# Patient Record
Sex: Female | Born: 1985 | Race: Black or African American | Hispanic: No | Marital: Single | Smoking: Never smoker
Health system: Southern US, Community
[De-identification: ages and names within clinical notes are randomized; demographics above are authoritative.]

## PROBLEM LIST (undated history)

## (undated) ENCOUNTER — Inpatient Hospital Stay (HOSPITAL_COMMUNITY): Payer: Self-pay

## (undated) ENCOUNTER — Ambulatory Visit (HOSPITAL_COMMUNITY): Admission: EM | Discharge status: Absent without leave | Payer: Medicaid Other

## (undated) DIAGNOSIS — T7840XA Allergy, unspecified, initial encounter: Secondary | ICD-10-CM

## (undated) DIAGNOSIS — G4730 Sleep apnea, unspecified: Secondary | ICD-10-CM

## (undated) DIAGNOSIS — K219 Gastro-esophageal reflux disease without esophagitis: Secondary | ICD-10-CM

## (undated) DIAGNOSIS — J45909 Unspecified asthma, uncomplicated: Secondary | ICD-10-CM

## (undated) DIAGNOSIS — T783XXA Angioneurotic edema, initial encounter: Secondary | ICD-10-CM

## (undated) HISTORY — DX: Gastro-esophageal reflux disease without esophagitis: K21.9

## (undated) HISTORY — DX: Angioneurotic edema, initial encounter: T78.3XXA

## (undated) HISTORY — DX: Allergy, unspecified, initial encounter: T78.40XA

## (undated) HISTORY — DX: Unspecified asthma, uncomplicated: J45.909

## (undated) HISTORY — DX: Sleep apnea, unspecified: G47.30

---
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
## 2003-06-27 ENCOUNTER — Other Ambulatory Visit: Admission: RE | Admit: 2003-06-27 | Discharge: 2003-06-27 | Payer: Self-pay | Admitting: Obstetrics and Gynecology

## 2004-12-21 ENCOUNTER — Emergency Department (HOSPITAL_COMMUNITY): Admission: EM | Admit: 2004-12-21 | Discharge: 2004-12-22 | Payer: Self-pay | Admitting: Emergency Medicine

## 2005-04-26 ENCOUNTER — Other Ambulatory Visit: Admission: RE | Admit: 2005-04-26 | Discharge: 2005-04-26 | Payer: Self-pay | Admitting: Gynecology

## 2006-01-03 ENCOUNTER — Other Ambulatory Visit: Admission: RE | Admit: 2006-01-03 | Discharge: 2006-01-03 | Payer: Self-pay | Admitting: Gynecology

## 2008-05-03 ENCOUNTER — Ambulatory Visit: Payer: Self-pay | Admitting: Vascular Surgery

## 2008-05-03 ENCOUNTER — Encounter (INDEPENDENT_AMBULATORY_CARE_PROVIDER_SITE_OTHER): Payer: Self-pay | Admitting: Emergency Medicine

## 2008-05-03 ENCOUNTER — Emergency Department (HOSPITAL_COMMUNITY): Admission: EM | Admit: 2008-05-03 | Discharge: 2008-05-03 | Payer: Self-pay | Admitting: Emergency Medicine

## 2008-07-31 ENCOUNTER — Other Ambulatory Visit: Admission: RE | Admit: 2008-07-31 | Discharge: 2008-07-31 | Payer: Self-pay | Admitting: Family Medicine

## 2008-09-13 ENCOUNTER — Emergency Department (HOSPITAL_BASED_OUTPATIENT_CLINIC_OR_DEPARTMENT_OTHER): Admission: EM | Admit: 2008-09-13 | Discharge: 2008-09-13 | Payer: Self-pay | Admitting: Emergency Medicine

## 2008-09-13 ENCOUNTER — Ambulatory Visit: Payer: Self-pay | Admitting: Diagnostic Radiology

## 2010-04-22 LAB — GLUCOSE, CAPILLARY: Glucose-Capillary: 196 mg/dL — ABNORMAL HIGH (ref 70–99)

## 2011-03-30 ENCOUNTER — Encounter (HOSPITAL_COMMUNITY): Payer: Self-pay

## 2011-03-30 ENCOUNTER — Emergency Department (INDEPENDENT_AMBULATORY_CARE_PROVIDER_SITE_OTHER)
Admission: EM | Admit: 2011-03-30 | Discharge: 2011-03-30 | Disposition: A | Discharge status: Home / Self Care | Payer: Self-pay | Source: Home / Self Care | Attending: Emergency Medicine | Admitting: Emergency Medicine

## 2011-03-30 DIAGNOSIS — R109 Unspecified abdominal pain: Secondary | ICD-10-CM

## 2011-03-30 DIAGNOSIS — R7309 Other abnormal glucose: Secondary | ICD-10-CM

## 2011-03-30 DIAGNOSIS — R739 Hyperglycemia, unspecified: Secondary | ICD-10-CM

## 2011-03-30 LAB — POCT PREGNANCY, URINE: Preg Test, Ur: NEGATIVE

## 2011-03-30 LAB — POCT URINALYSIS DIP (DEVICE)
Glucose, UA: 500 mg/dL — AB
Ketones, ur: NEGATIVE mg/dL
Protein, ur: NEGATIVE mg/dL
Specific Gravity, Urine: 1.02 (ref 1.005–1.030)

## 2011-03-30 LAB — GLUCOSE, CAPILLARY: Glucose-Capillary: 352 mg/dL — ABNORMAL HIGH (ref 70–99)

## 2011-03-30 MED ORDER — METFORMIN HCL 500 MG PO TABS: 1000.0000 mg | ORAL_TABLET | Freq: Two times a day (BID) | ORAL | Status: DC

## 2011-03-30 NOTE — ED Notes (Signed)
Spoke to the physician in regards to patient and her request for snack.  Hold on po for now

## 2011-03-30 NOTE — ED Notes (Signed)
Given diet ginger ale and nutrigrain bar

## 2011-03-30 NOTE — ED Provider Notes (Signed)
History     CSN: [id number]  Arrival date & time 03/30/11  0907   First MD Initiated Contact with Patient 03/30/11 1011      Chief Complaint  Patient presents with  . Abdominal Pain    (Consider location/radiation/quality/duration/timing/severity/associated sxs/prior treatment) HPI Comments: Patient presents urgent care today complaining of ongoing abdominal pain for about 2 months occasionally feels like burning (points to epigastric area) occasionally feels the pain is on her right side and sometimes move towards her left side her abdomen. No vomiting, no nausea, no fevers, no urinary symptoms. She does describe however that she's feels somewhat always thirsty and urinates frequently but that has not changed for months.  Patient describes that she remembers her last time her hemoglobin A1c was checked was off the charts she has been unable to followup with her doctor for a "while".  Patient is a 26 y.o. female presenting with abdominal pain. The history is provided by the patient.  Abdominal Pain The primary symptoms of the illness include abdominal pain. The primary symptoms of the illness do not include fever, fatigue, shortness of breath, vomiting, diarrhea, dysuria, vaginal discharge or vaginal bleeding. The current episode started more than 2 days ago. The onset of the illness was sudden. The problem has not changed since onset. The patient states that she believes she is currently not pregnant. Additional symptoms associated with the illness include frequency. Symptoms associated with the illness do not include chills, anorexia, diaphoresis, constipation, hematuria or back pain.    Past Medical History  Diagnosis Date  . Diabetes mellitus     History reviewed. No pertinent past surgical history.  History reviewed. No pertinent family history.  History  Substance Use Topics  . Smoking status: Never Smoker   . Smokeless tobacco: Not on file  . Alcohol Use: No    OB  History    Grav Para Term Preterm Abortions TAB SAB Ect Mult Living                  Review of Systems  Constitutional: Negative for fever, chills, diaphoresis, activity change and fatigue.  Respiratory: Negative for shortness of breath.   Gastrointestinal: Positive for abdominal pain. Negative for vomiting, diarrhea, constipation, abdominal distention, rectal pain and anorexia.  Genitourinary: Positive for frequency. Negative for dysuria, hematuria, vaginal bleeding and vaginal discharge.  Musculoskeletal: Negative for back pain.    Allergies  Review of patient's allergies indicates no known allergies.  Home Medications   Current Outpatient Rx  Name Route Sig Dispense Refill  . ASPIRIN BUFFERED PO Oral Take by mouth.    [name] PEPTO-BISMOL PO Oral Take by mouth.    [name] GLIPIZIDE 10 MG PO TABS Oral Take 10 mg by mouth 2 (two) times daily before a meal.    . IBUPROFEN 200 MG PO TABS Oral Take 400 mg by mouth every 6 (six) hours as needed.    [name] METFORMIN HCL 500 MG PO TABS Oral Take 2 tablets (1,000 mg total) by mouth 2 (two) times daily with a meal. 60 tablet 0    BP 121/84  Pulse 91  Temp(Src) 98.5 F (36.9 C) (Oral)  Resp 16  SpO2 99%  LMP 03/24/2011  Physical Exam  Constitutional:  Non-toxic appearance. She does not have a sickly appearance. She does not appear ill. No distress.    Abdominal: Soft. Normal appearance. She exhibits no pulsatile liver, no abdominal bruit and no mass. There is no hepatosplenomegaly or hepatomegaly. There is tenderness  in the epigastric area and periumbilical area. There is no rigidity, no rebound, no guarding, no CVA tenderness, no tenderness at McBurney's point and negative Murphy's sign. No hernia. Hernia confirmed negative in the ventral area.    Skin: She is not diaphoretic.    ED Course  Procedures (including critical care time)  Labs Reviewed  POCT URINALYSIS DIP (DEVICE) - Abnormal; Notable for the following:    Glucose, UA 500 (*)      Hgb urine dipstick TRACE (*)    All other components within normal limits  GLUCOSE, CAPILLARY - Abnormal; Notable for the following:    Glucose-Capillary 352 (*)    All other components within normal limits  POCT PREGNANCY, URINE   No results found.   1. Hyperglycemia   2. Abdominal pain       MDM  Have recommended patient to followup with her primary care doctor and if any acute exacerbation of her gastrointestinal symptoms of the emergency department to rule out a gallbladder disorder such as stones or infection this (especially if fevers). Patient continues to be hypoglycemic for at least 6-8 months to a year and we will schedule a followup with her primary care Dr. this week she agreed to increase her metformin dose and be more observant of her diet.        Karina Molly, MD 03/30/11 1200

## 2011-03-30 NOTE — ED Notes (Signed)
Patient c/o of right sided abdominal pain; pt states that the pain started 2 months with burning and pulsating. Majority of pain is on right side with incident of pain on the left side once. Patient denies chest pain or back pain. Neck pain on right side with pulsating symptoms.

## 2011-03-30 NOTE — Discharge Instructions (Signed)
We discussed the importance of you following up within the next one to 2 weeks with your primary care Dr. for further diabetes management. Your exam and labs were consistent with moderate to marked hyperglycemia. Be cautious about your diet and increase your metformin dose as explained into you get to see your Dr.  Your abdominal pain is possibly related to your diabetes, if symptoms were to change including worsening of your abdominal pain, fevers, vomiting or no improvement should followup in the emergency department for further evaluation and rule out other conditions.    Abdominal Pain Abdominal pain can be caused by many things. Your caregiver decides the seriousness of your pain by an examination and possibly blood tests and X-rays. Many cases can be observed and treated at home. Most abdominal pain is not caused by a disease and will probably improve without treatment. However, in many cases, more time must pass before a clear cause of the pain can be found. Before that point, it may not be known if you need more testing, or if hospitalization or surgery is needed. HOME CARE INSTRUCTIONS   Do not take laxatives unless directed by your caregiver.   Take pain medicine only as directed by your caregiver.   Only take over-the-counter or prescription medicines for pain, discomfort, or fever as directed by your caregiver.   Try a clear liquid diet (broth, tea, or water) for as long as directed by your caregiver. Slowly move to a bland diet as tolerated.  SEEK IMMEDIATE MEDICAL CARE IF:   The pain does not go away.   You have a fever.   You keep throwing up (vomiting).   The pain is felt only in portions of the abdomen. Pain in the right side could possibly be appendicitis. In an adult, pain in the left lower portion of the abdomen could be colitis or diverticulitis.   You pass bloody or black tarry stools.  MAKE SURE YOU:   Understand these instructions.   Will watch your condition.     Will get help right away if you are not doing well or get worse.  Document Released: 10/07/2004 Document Revised: 12/17/2010 Document Reviewed: 08/16/2007 [facility] Patient Information 2012 [place], [place].

## 2011-03-30 NOTE — ED Notes (Signed)
Patient in bathroom for specimen

## 2011-11-16 ENCOUNTER — Inpatient Hospital Stay (HOSPITAL_COMMUNITY): Payer: Medicaid Other

## 2011-11-16 ENCOUNTER — Inpatient Hospital Stay (HOSPITAL_COMMUNITY)
Admission: AD | Admit: 2011-11-16 | Discharge: 2011-11-20 | DRG: 781 | Disposition: A | Discharge status: Home / Self Care | Payer: Medicaid Other | Source: Ambulatory Visit | Attending: Obstetrics & Gynecology | Admitting: Obstetrics & Gynecology

## 2011-11-16 ENCOUNTER — Encounter (HOSPITAL_COMMUNITY): Payer: Self-pay | Admitting: *Deleted

## 2011-11-16 DIAGNOSIS — B3731 Acute candidiasis of vulva and vagina: Secondary | ICD-10-CM | POA: Diagnosis present

## 2011-11-16 DIAGNOSIS — E119 Type 2 diabetes mellitus without complications: Secondary | ICD-10-CM | POA: Diagnosis present

## 2011-11-16 DIAGNOSIS — O2390 Unspecified genitourinary tract infection in pregnancy, unspecified trimester: Secondary | ICD-10-CM | POA: Diagnosis present

## 2011-11-16 DIAGNOSIS — R109 Unspecified abdominal pain: Secondary | ICD-10-CM | POA: Diagnosis present

## 2011-11-16 DIAGNOSIS — O26899 Other specified pregnancy related conditions, unspecified trimester: Secondary | ICD-10-CM

## 2011-11-16 DIAGNOSIS — O24919 Unspecified diabetes mellitus in pregnancy, unspecified trimester: Principal | ICD-10-CM | POA: Diagnosis present

## 2011-11-16 DIAGNOSIS — B373 Candidiasis of vulva and vagina: Secondary | ICD-10-CM | POA: Diagnosis present

## 2011-11-16 LAB — COMPREHENSIVE METABOLIC PANEL
CO2: 23 mEq/L (ref 19–32)
Calcium: 9.1 mg/dL (ref 8.4–10.5)
Chloride: 98 mEq/L (ref 96–112)
Creatinine, Ser: 0.86 mg/dL (ref 0.50–1.10)
GFR calc Af Amer: >90 mL/min (ref >90)
GFR calc non Af Amer: >90 mL/min (ref >90)
Glucose, Bld: 271 mg/dL — ABNORMAL HIGH (ref 70–99)
Total Bilirubin: 0.2 mg/dL — ABNORMAL LOW (ref 0.3–1.2)

## 2011-11-16 LAB — GLUCOSE, CAPILLARY
Glucose-Capillary: 200 mg/dL — ABNORMAL HIGH (ref 70–99)
Glucose-Capillary: 194 mg/dL — ABNORMAL HIGH (ref 70–99)
Glucose-Capillary: 260 mg/dL — ABNORMAL HIGH (ref 70–99)

## 2011-11-16 LAB — WET PREP, GENITAL
Trich, Wet Prep: NONE SEEN
Yeast Wet Prep HPF POC: NONE SEEN

## 2011-11-16 LAB — CBC WITH DIFFERENTIAL/PLATELET
Basophils Absolute: 0 10*3/uL (ref 0.0–0.1)
Eosinophils Relative: 1 % (ref 0–5)
Lymphocytes Relative: 33 % (ref 12–46)
Neutro Abs: 4.4 10*3/uL (ref 1.7–7.7)
Platelets: 335 10*3/uL (ref 150–400)
RDW: 13 % (ref 11.5–15.5)
WBC: 7.3 10*3/uL (ref 4.0–10.5)

## 2011-11-16 LAB — POCT PREGNANCY, URINE: Preg Test, Ur: POSITIVE — AB

## 2011-11-16 LAB — URINALYSIS, ROUTINE W REFLEX MICROSCOPIC
Leukocytes, UA: NEGATIVE
Nitrite: NEGATIVE
Specific Gravity, Urine: <1.005 — ABNORMAL LOW (ref 1.005–1.030)
Urobilinogen, UA: 0.2 mg/dL (ref 0.0–1.0)

## 2011-11-16 LAB — HCG, QUANTITATIVE, PREGNANCY: hCG, Beta Chain, Quant, S: 49973 m[IU]/mL — ABNORMAL HIGH (ref <5)

## 2011-11-16 LAB — TSH: TSH: 2.329 u[IU]/mL (ref 0.350–4.500)

## 2011-11-16 MED ORDER — PRENATAL MULTIVITAMIN CH
1.0000 | ORAL_TABLET | Freq: Every day | ORAL | Status: DC
  Administered 2011-11-16 – 2011-11-20 (×5): 1 via ORAL
  Filled 2011-11-16 (×5): qty 1

## 2011-11-16 MED ORDER — ACETAMINOPHEN 325 MG PO TABS
650.0000 mg | ORAL_TABLET | ORAL | Status: DC | PRN
  Administered 2011-11-16: 650 mg via ORAL
  Filled 2011-11-16: qty 2

## 2011-11-16 MED ORDER — INSULIN ASPART 100 UNIT/ML ~~LOC~~ SOLN
8.0000 [IU] | Freq: Once | SUBCUTANEOUS | Status: AC
  Administered 2011-11-16: 8 [IU] via SUBCUTANEOUS
  Filled 2011-11-16: qty 1

## 2011-11-16 MED ORDER — INSULIN REGULAR HUMAN 100 UNIT/ML IJ SOLN
12.0000 [IU] | Freq: Every day | INTRAMUSCULAR | Status: DC
  Administered 2011-11-16: 12 [IU] via SUBCUTANEOUS
  Filled 2011-11-16 (×9): qty 0.12

## 2011-11-16 MED ORDER — INSULIN NPH (HUMAN) (ISOPHANE) 100 UNIT/ML ~~LOC~~ SUSP
11.0000 [IU] | Freq: Every day | SUBCUTANEOUS | Status: DC
  Administered 2011-11-16: 11 [IU] via SUBCUTANEOUS

## 2011-11-16 MED ORDER — INSULIN REGULAR HUMAN 100 UNIT/ML IJ SOLN: 11.0000 [IU] | Freq: Every day | INTRAMUSCULAR | Status: DC

## 2011-11-16 MED ORDER — FLUCONAZOLE 150 MG PO TABS
150.0000 mg | ORAL_TABLET | Freq: Once | ORAL | Status: AC
  Administered 2011-11-16: 150 mg via ORAL
  Filled 2011-11-16 (×2): qty 1

## 2011-11-16 MED ORDER — ZOLPIDEM TARTRATE 5 MG PO TABS: 5.0000 mg | ORAL_TABLET | Freq: Every evening | ORAL | Status: DC | PRN

## 2011-11-16 MED ORDER — INSULIN NPH (HUMAN) (ISOPHANE) 100 UNIT/ML ~~LOC~~ SUSP
24.0000 [IU] | Freq: Every day | SUBCUTANEOUS | Status: DC
  Administered 2011-11-16 – 2011-11-17 (×2): 24 [IU] via SUBCUTANEOUS
  Filled 2011-11-16: qty 10

## 2011-11-16 NOTE — MAU Provider Note (Signed)
History     CSN: [id number]  Arrival date and time: 11/16/11 0032   None     Chief Complaint  Patient presents with  . Abdominal Pain   HPI Karina [name] is a 26 y.o. female who presents to MAU with abdominal pain. The pain is located in the lower abdomen. The pain started tonight. She describes the pain as cramping, she rates the pain as 7/10. associated symptoms include vaginal discharge and itching. She denies vaginal bleeding or leaking of fluid. She had a positive pregnancy test at [facility]. They told her since she has diabetes she will most likely be referred to [facility].  She has her next appointment with [facility] this week. She was a patient at [facility] and started on oral medication for diabetes. She takes her father's insulin. The history was provided by the patient.  OB History    Grav Para Term Preterm Abortions TAB SAB Ect Mult Living                  Past Medical History  Diagnosis Date  . Diabetes mellitus     No past surgical history on file.  No family history on file.  History  Substance Use Topics  . Smoking status: Never Smoker   . Smokeless tobacco: Not on file  . Alcohol Use: No    Allergies: No Known Allergies  Prescriptions prior to admission  Medication Sig Dispense Refill  . ASPIRIN BUFFERED PO Take by mouth.      . Bismuth Subsalicylate (PEPTO-BISMOL PO) Take by mouth.      [name] glipiZIDE (GLUCOTROL) 10 MG tablet Take 10 mg by mouth 2 (two) times daily before a meal.      . ibuprofen (ADVIL,MOTRIN) 200 MG tablet Take 400 mg by mouth every 6 (six) hours as needed.      . metFORMIN (GLUCOPHAGE) 500 MG tablet Take 2 tablets (1,000 mg total) by mouth 2 (two) times daily with a meal.  60 tablet  0    Review of Systems  Constitutional: Negative for chills and weight loss.  HENT: Negative for ear pain, nosebleeds, congestion, sore throat and neck pain.   Eyes: Negative for blurred vision, double vision, photophobia and pain.    Respiratory: Negative for cough, shortness of breath and wheezing.   Cardiovascular: Negative for chest pain, palpitations and leg swelling.  Gastrointestinal: Positive for nausea and abdominal pain. Negative for heartburn, vomiting, diarrhea and constipation.  Genitourinary: Positive for frequency. Negative for dysuria (pressure) and urgency.       Vaginal discharge  Musculoskeletal: Negative for myalgias and back pain.  Skin: Negative for itching and rash.  Neurological: Positive for headaches. Negative for dizziness, sensory change, speech change, seizures and weakness.  Endo/Heme/Allergies: Does not bruise/bleed easily.  Psychiatric/Behavioral: Negative for depression. The patient is not nervous/anxious.    Physical Exam   Blood pressure 129/78, pulse 85, temperature 99 F (37.2 C), temperature source Oral, resp. rate 18, height 5\' 2"  (1.575 m), weight 213 lb (96.616 kg), last menstrual period 09/04/2011, SpO2 100.00%.  Physical Exam  Nursing note and vitals reviewed. Constitutional: She is oriented to person, place, and time. She appears well-developed and well-nourished. No distress.  HENT:  Head: Normocephalic and atraumatic.  Eyes: EOM are normal.  Neck: Neck supple.  Cardiovascular: Normal rate.   Respiratory: Effort normal.  GI: Soft. There is no tenderness.  Genitourinary:       External genitalia with discolored area perineum c/w yeast.  Thick white discharge vaginal vault. Cervix long, closed, no CMT mildly tender bilateral adnexa. Uterus approximately 10 week size.    Musculoskeletal: Normal range of motion.  Neurological: She is alert and oriented to person, place, and time.  Skin: Skin is warm and dry.  Psychiatric: She has a normal mood and affect. Her behavior is normal. Judgment and thought content normal.   Results for orders placed during the hospital encounter of 11/16/11 (from the past 24 hour(s))  URINALYSIS, ROUTINE W REFLEX MICROSCOPIC     Status: Abnormal    Collection Time   11/16/11 12:48 AM      Component Value Range   Color, Urine YELLOW  YELLOW   APPearance CLEAR  CLEAR   Specific Gravity, Urine <1.005 (*) 1.005 - 1.030   pH 7.0  5.0 - 8.0   Glucose, UA 500 (*) NEGATIVE mg/dL   Hgb urine dipstick NEGATIVE  NEGATIVE   Bilirubin Urine NEGATIVE  NEGATIVE   Ketones, ur NEGATIVE  NEGATIVE mg/dL   Protein, ur NEGATIVE  NEGATIVE mg/dL   Urobilinogen, UA 0.2  0.0 - 1.0 mg/dL   Nitrite NEGATIVE  NEGATIVE   Leukocytes, UA NEGATIVE  NEGATIVE  POCT PREGNANCY, URINE     Status: Abnormal   Collection Time   11/16/11 12:56 AM      Component Value Range   Preg Test, Ur POSITIVE (*) NEGATIVE  WET PREP, GENITAL     Status: Abnormal   Collection Time   11/16/11  1:25 AM      Component Value Range   Yeast Wet Prep HPF POC NONE SEEN  NONE SEEN   Trich, Wet Prep NONE SEEN  NONE SEEN   Clue Cells Wet Prep HPF POC NONE SEEN  NONE SEEN   WBC, Wet Prep HPF POC FEW (*) NONE SEEN  GLUCOSE, CAPILLARY     Status: Abnormal   Collection Time   11/16/11  1:44 AM      Component Value Range   Glucose-Capillary 298 (*) 70 - 99 mg/dL  CBC WITH DIFFERENTIAL     Status: Abnormal   Collection Time   11/16/11  2:00 AM      Component Value Range   WBC 7.3  4.0 - 10.5 K/uL   RBC 4.26  3.87 - 5.11 MIL/uL   Hemoglobin 11.7 (*) 12.0 - 15.0 g/dL   HCT 40.9 (*) 81.1 - 91.4 %   MCV 79.8  78.0 - 100.0 fL   MCH 27.5  26.0 - 34.0 pg   MCHC 34.4  30.0 - 36.0 g/dL   RDW 78.2  95.6 - 21.3 %   Platelets 335  150 - 400 K/uL   Neutrophils Relative 60  43 - 77 %   Neutro Abs 4.4  1.7 - 7.7 K/uL   Lymphocytes Relative 33  12 - 46 %   Lymphs Abs 2.4  0.7 - 4.0 K/uL   Monocytes Relative 6  3 - 12 %   Monocytes Absolute 0.5  0.1 - 1.0 K/uL   Eosinophils Relative 1  0 - 5 %   Eosinophils Absolute 0.1  0.0 - 0.7 K/uL   Basophils Relative 0  0 - 1 %   Basophils Absolute 0.0  0.0 - 0.1 K/uL   U/S shows a 10 week 1 day Viable IUP with EDD of 06/10/12  Assessment: 26 y.o.  female @ [redacted] weeks gestation with abdominal pain   Diabetes   Monilia vulvovaginitis   Plan:  Diflucan 150 mg  po now MAU Course: discussed with Dr. Penne Lash. Will admit for diabetic control and education.   Procedures  Shacoya Burkhammer, RN, FNP, [facility] 11/16/2011, 12:58 AM

## 2011-11-16 NOTE — MAU Note (Signed)
Pt reports she is [redacted] weeks pregnant, pt reports she has had lower abd cramping x 10-11 hours, denies bleeding. ? Temp at home.

## 2011-11-17 DIAGNOSIS — O24919 Unspecified diabetes mellitus in pregnancy, unspecified trimester: Secondary | ICD-10-CM

## 2011-11-17 LAB — GLUCOSE, CAPILLARY
Glucose-Capillary: 270 mg/dL — ABNORMAL HIGH (ref 70–99)
Glucose-Capillary: 191 mg/dL — ABNORMAL HIGH (ref 70–99)

## 2011-11-17 LAB — GC/CHLAMYDIA PROBE AMP, GENITAL
Chlamydia, DNA Probe: NEGATIVE
GC Probe Amp, Genital: NEGATIVE

## 2011-11-17 MED ORDER — INSULIN NPH (HUMAN) (ISOPHANE) 100 UNIT/ML ~~LOC~~ SUSP
11.0000 [IU] | Freq: Two times a day (BID) | SUBCUTANEOUS | Status: DC
  Administered 2011-11-17 – 2011-11-19 (×4): 11 [IU] via SUBCUTANEOUS

## 2011-11-17 MED ORDER — INSULIN ASPART 100 UNIT/ML ~~LOC~~ SOLN: 0.0000 [IU] | Freq: Every day | SUBCUTANEOUS | Status: DC

## 2011-11-17 MED ORDER — INSULIN NPH (HUMAN) (ISOPHANE) 100 UNIT/ML ~~LOC~~ SUSP: 12.0000 [IU] | Freq: Two times a day (BID) | SUBCUTANEOUS | Status: DC

## 2011-11-17 MED ORDER — INSULIN ASPART 100 UNIT/ML ~~LOC~~ SOLN
16.0000 [IU] | Freq: Three times a day (TID) | SUBCUTANEOUS | Status: DC
  Administered 2011-11-17 (×2): 100 [IU] via SUBCUTANEOUS

## 2011-11-17 MED ORDER — INSULIN ASPART 100 UNIT/ML ~~LOC~~ SOLN
4.0000 [IU] | Freq: Three times a day (TID) | SUBCUTANEOUS | Status: DC
  Administered 2011-11-17 – 2011-11-18 (×3): 7 [IU] via SUBCUTANEOUS
  Administered 2011-11-18: 4 [IU] via SUBCUTANEOUS
  Administered 2011-11-19 (×2): 7 [IU] via SUBCUTANEOUS
  Administered 2011-11-19 – 2011-11-20 (×2): 4 [IU] via SUBCUTANEOUS

## 2011-11-17 MED ORDER — INSULIN ASPART 100 UNIT/ML ~~LOC~~ SOLN
0.0000 [IU] | Freq: Three times a day (TID) | SUBCUTANEOUS | Status: DC
  Administered 2011-11-17: 3 [IU] via SUBCUTANEOUS
  Administered 2011-11-18: 6 [IU] via SUBCUTANEOUS
  Administered 2011-11-18: 4 [IU] via SUBCUTANEOUS
  Administered 2011-11-18: 6 [IU] via SUBCUTANEOUS
  Administered 2011-11-19: 3 [IU] via SUBCUTANEOUS
  Administered 2011-11-19: 6 [IU] via SUBCUTANEOUS
  Administered 2011-11-19: 9 [IU] via SUBCUTANEOUS
  Administered 2011-11-20: 6 [IU] via SUBCUTANEOUS

## 2011-11-17 NOTE — Progress Notes (Signed)
  Nutrition Dx: Food and nutrition-related knowledge deficit r/t no previous education aeb newly diagnosed GDM.    Nutrition education consult for Carbohydrate Modified Gestational Diabetic Diet completed.  "Meal  plan for gestational diabetics" handout given to patient.  Basic concepts reviewed.  Questions answered.  Patient verbalizes understanding.  Amee Boothe M.Ed. R.D. LDN Neonatal Nutrition Support Specialist Pager [phone]      

## 2011-11-17 NOTE — H&P (Signed)
Chief Complaint   Patient presents with   .  Abdominal Pain    HPI Karina [name] is a 26 y.o. female who presents to MAU with abdominal pain. The pain is located in the lower abdomen. The pain started tonight. She describes the pain as cramping, she rates the pain as 7/10. associated symptoms include vaginal discharge and itching. She denies vaginal bleeding or leaking of fluid. She had a positive pregnancy test at [facility]. They told her since she has diabetes she will most likely be referred to [facility]. She has her next appointment with [facility] this week. She was a patient at [facility] and started on oral medication for diabetes. She takes her father's insulin. The history was provided by the patient.  OB History    Grav  Para  Term  Preterm  Abortions  TAB  SAB  Ect  Mult  Living                  Past Medical History   Diagnosis  Date   .  Diabetes mellitus     No past surgical history on file.  No family history on file.  History   Substance Use Topics   .  Smoking status:  Never Smoker   .  Smokeless tobacco:  Not on file   .  Alcohol Use:  No    Allergies: No Known Allergies  Prescriptions prior to admission   Medication  Sig  Dispense  Refill   .  ASPIRIN BUFFERED PO  Take by mouth.     .  Bismuth Subsalicylate (PEPTO-BISMOL PO)  Take by mouth.     [name]  glipiZIDE (GLUCOTROL) 10 MG tablet  Take 10 mg by mouth 2 (two) times daily before a meal.     .  ibuprofen (ADVIL,MOTRIN) 200 MG tablet  Take 400 mg by mouth every 6 (six) hours as needed.     .  metFORMIN (GLUCOPHAGE) 500 MG tablet  Take 2 tablets (1,000 mg total) by mouth 2 (two) times daily with a meal.  60 tablet  0    Review of Systems  Constitutional: Negative for chills and weight loss.  HENT: Negative for ear pain, nosebleeds, congestion, sore throat and neck pain.  Eyes: Negative for blurred vision, double vision, photophobia and pain.  Respiratory: Negative for cough, shortness of breath and wheezing.    Cardiovascular: Negative for chest pain, palpitations and leg swelling.  Gastrointestinal: Positive for nausea and abdominal pain. Negative for heartburn, vomiting, diarrhea and constipation.  Genitourinary: Positive for frequency. Negative for dysuria (pressure) and urgency.  Vaginal discharge  Musculoskeletal: Negative for myalgias and back pain.  Skin: Negative for itching and rash.  Neurological: Positive for headaches. Negative for dizziness, sensory change, speech change, seizures and weakness.  Endo/Heme/Allergies: Does not bruise/bleed easily.  Psychiatric/Behavioral: Negative for depression. The patient is not nervous/anxious.   Physical Exam   Blood pressure 129/78, pulse 85, temperature 99 F (37.2 C), temperature source Oral, resp. rate 18, height 5\' 2"  (1.575 m), weight 213 lb (96.616 kg), last menstrual period 09/04/2011, SpO2 100.00%.  Physical Exam  Nursing note and vitals reviewed.  Constitutional: She is oriented to person, place, and time. She appears well-developed and well-nourished. No distress.  HENT:  Head: Normocephalic and atraumatic.  Eyes: EOM are normal.  Neck: Neck supple.  Cardiovascular: Normal rate.  Respiratory: Effort normal.  GI: Soft. There is no tenderness.  Genitourinary:  External genitalia with discolored area perineum  c/w yeast. Thick white discharge vaginal vault. Cervix long, closed, no CMT mildly tender bilateral adnexa. Uterus approximately 10 week size.  Musculoskeletal: Normal range of motion.  Neurological: She is alert and oriented to person, place, and time.  Skin: Skin is warm and dry.  Psychiatric: She has a normal mood and affect. Her behavior is normal. Judgment and thought content normal.   Results for orders placed during the hospital encounter of 11/16/11 (from the past 24 hour(s))   URINALYSIS, ROUTINE W REFLEX MICROSCOPIC Status: Abnormal    Collection Time    11/16/11 12:48 AM   Component  Value  Range    Color, Urine   YELLOW  YELLOW    APPearance  CLEAR  CLEAR    Specific Gravity, Urine  <1.005 (*)  1.005 - 1.030    pH  7.0  5.0 - 8.0    Glucose, UA  500 (*)  NEGATIVE mg/dL    Hgb urine dipstick  NEGATIVE  NEGATIVE    Bilirubin Urine  NEGATIVE  NEGATIVE    Ketones, ur  NEGATIVE  NEGATIVE mg/dL    Protein, ur  NEGATIVE  NEGATIVE mg/dL    Urobilinogen, UA  0.2  0.0 - 1.0 mg/dL    Nitrite  NEGATIVE  NEGATIVE    Leukocytes, UA  NEGATIVE  NEGATIVE   POCT PREGNANCY, URINE Status: Abnormal    Collection Time    11/16/11 12:56 AM   Component  Value  Range    Preg Test, Ur  POSITIVE (*)  NEGATIVE   WET PREP, GENITAL Status: Abnormal    Collection Time    11/16/11 1:25 AM   Component  Value  Range    Yeast Wet Prep HPF POC  NONE SEEN  NONE SEEN    Trich, Wet Prep  NONE SEEN  NONE SEEN    Clue Cells Wet Prep HPF POC  NONE SEEN  NONE SEEN    WBC, Wet Prep HPF POC  FEW (*)  NONE SEEN   GLUCOSE, CAPILLARY Status: Abnormal    Collection Time    11/16/11 1:44 AM   Component  Value  Range    Glucose-Capillary  298 (*)  70 - 99 mg/dL   CBC WITH DIFFERENTIAL Status: Abnormal    Collection Time    11/16/11 2:00 AM   Component  Value  Range    WBC  7.3  4.0 - 10.5 K/uL    RBC  4.26  3.87 - 5.11 MIL/uL    Hemoglobin  11.7 (*)  12.0 - 15.0 g/dL    HCT  16.1 (*)  09.6 - 46.0 %    MCV  79.8  78.0 - 100.0 fL    MCH  27.5  26.0 - 34.0 pg    MCHC  34.4  30.0 - 36.0 g/dL    RDW  04.5  40.9 - 81.1 %    Platelets  335  150 - 400 K/uL    Neutrophils Relative  60  43 - 77 %    Neutro Abs  4.4  1.7 - 7.7 K/uL    Lymphocytes Relative  33  12 - 46 %    Lymphs Abs  2.4  0.7 - 4.0 K/uL    Monocytes Relative  6  3 - 12 %    Monocytes Absolute  0.5  0.1 - 1.0 K/uL    Eosinophils Relative  1  0 - 5 %    Eosinophils Absolute  0.1  0.0 -  0.7 K/uL    Basophils Relative  0  0 - 1 %    Basophils Absolute  0.0  0.0 - 0.1 K/uL    U/S shows a 10 week 1 day Viable IUP with EDD of 06/10/12  Assessment: 26 y.o. female @ [redacted] weeks  gestation with abdominal pain  Diabetes-poor glycemic contol Monilia vulvovaginitis   Plan: Admission for glycemic control-Insulin start Diabetes and Nutrition consult. Baseline labs. Diflucan 150 mg po now

## 2011-11-17 NOTE — Progress Notes (Signed)
Patient ID: Karina [name], female   DOB: [birth year], 26 y.o.   MRN: [id number] FACULTY PRACTICE ANTEPARTUM(COMPREHENSIVE) NOTE  Karina [name] is a 26 y.o. G1P0 at Unknown by early ultrasound who is admitted for glycemic control.    Length of Stay:  1  Days  Subjective: Reports feeling well.  Started insulin yesterday but no improvement in BS.   Vitals:  Blood pressure 129/79, pulse 84, temperature 98.3 F (36.8 C), temperature source Oral, resp. rate 18, height 5\' 2"  (1.575 m), weight 204 lb (92.534 kg), last menstrual period 09/04/2011, SpO2 98.00%. Physical Examination:  General appearance - alert, well appearing, and in no distress Abdomen - soft, nontender, nondistended, no masses or organomegaly Extremities: extremities normal, atraumatic, no cyanosis or edema   Labs:  Recent Results (from the past 24 hour(s))  GLUCOSE, CAPILLARY   Collection Time   11/16/11 10:13 AM      Component Value Range   Glucose-Capillary 245 (*) 70 - 99 mg/dL  GLUCOSE, CAPILLARY   Collection Time   11/16/11  1:44 PM      Component Value Range   Glucose-Capillary 250 (*) 70 - 99 mg/dL   Comment 1 Notify RN    GLUCOSE, CAPILLARY   Collection Time   11/16/11  8:21 PM      Component Value Range   Glucose-Capillary 229 (*) 70 - 99 mg/dL  GLUCOSE, CAPILLARY   Collection Time   11/16/11 10:43 PM      Component Value Range   Glucose-Capillary 260 (*) 70 - 99 mg/dL  GLUCOSE, CAPILLARY   Collection Time   11/17/11  5:36 AM      Component Value Range   Glucose-Capillary 270 (*) 70 - 99 mg/dL   glycosylated hemoglobin 13.3   Medications:  Scheduled    . insulin aspart  16 Units Subcutaneous TID WC  . insulin NPH  12 Units Subcutaneous BID  . prenatal multivitamin  1 tablet Oral Daily  . [DISCONTINUED] insulin NPH  11 Units Subcutaneous QHS  . [DISCONTINUED] insulin NPH  24 Units Subcutaneous QAC breakfast  . [DISCONTINUED] insulin regular  11 Units Subcutaneous QAC supper  . [DISCONTINUED]  insulin regular  12 Units Subcutaneous QAC breakfast   I have reviewed the patient's current medications.  ASSESSMENT: Patient Active Problem List  Diagnosis  . Preexisting Diabetes mellitus, antepartum    PLAN: I have changed her to NPH and Novolog. I will consult Diabetes education today. Lengthy discussion had with pt. Regarding DM and future impacts on health as well as impact on pregnancy.  Dimitrius Steedman S 11/17/2011,8:02 AM

## 2011-11-17 NOTE — Progress Notes (Signed)
Ur chart review completed.  

## 2011-11-17 NOTE — Consult Note (Addendum)
Diabetes Treatment Program Recommendations  ADA Standards of Care 2012 Diabetes in Pregnancy Target Glucose Ranges:  Fasting: 60 - 90 mg/dL Preprandial: 60 - 409 mg/dL 1 hr postprandial: Less than 140mg /dL (from first bite of meal) 2 hr postprandial: Less than 120 mg/dL (from first bit of meal)   Consult to recommend insulin doses for this patient at 92 kg and [redacted] weeks gestation.  Entered orders per protocol/order set via the antenatal link.  Dr. Shawnie Pons to review and release if desired.  I have not removed previous insuliln orders.  Spoke with patient and mother/family regarding type 2 diabetes, gestational diabetes component, risk for baby and need for control with insulin during pregnancy.  Pt agreeable and appears to understand the pathophysiology involved and need for control.  Pt has used insulin in the past.  She was put on insulins in 2009, but when she was unable to continue her insurance coverage, she was unable to buy her insulin.  She admits to taking her father's insulin, dosage based on how she felt.  She knows how to check glucose and administer insulin, although she has not used an insulin pen and would like to at discharge.  NPH Lilly pen is available and covered by medicaid as well as Novolog pen. I am glad to assist with teaching if ordered. Will call Dr. Shawnie Pons with pending orders to review.  Glad to assist and will review glucose with new regimen.  Have ordered RD consult for carb counting teaching and will order basic education via the video ed network. Thank you, Lenor Coffin, RN, CNS, Diabetes Coordinator [phone]) (cell: [phone])  Ad-Entered OP education referral into [facility].  The center may call her to schedule an appt before pt is discharged.  Will see pt to give her information on the center's location, etc

## 2011-11-18 DIAGNOSIS — O2390 Unspecified genitourinary tract infection in pregnancy, unspecified trimester: Secondary | ICD-10-CM

## 2011-11-18 DIAGNOSIS — B373 Candidiasis of vulva and vagina: Secondary | ICD-10-CM

## 2011-11-18 DIAGNOSIS — E119 Type 2 diabetes mellitus without complications: Secondary | ICD-10-CM

## 2011-11-18 DIAGNOSIS — O24919 Unspecified diabetes mellitus in pregnancy, unspecified trimester: Secondary | ICD-10-CM

## 2011-11-18 LAB — GLUCOSE, CAPILLARY
Glucose-Capillary: 161 mg/dL — ABNORMAL HIGH (ref 70–99)
Glucose-Capillary: 230 mg/dL — ABNORMAL HIGH (ref 70–99)
Glucose-Capillary: 233 mg/dL — ABNORMAL HIGH (ref 70–99)
Glucose-Capillary: 196 mg/dL — ABNORMAL HIGH (ref 70–99)

## 2011-11-18 NOTE — MAU Provider Note (Signed)
Medical Screening exam and patient care preformed by advanced practice provider.  Agree with the above management.  

## 2011-11-18 NOTE — Progress Notes (Signed)
*   No surgery found *  Subjective: Patient reports: abd pain earlier today that was relieved after she had a BM.  She reports that her vulva feels better today.    Objective: I have reviewed patient's vital signs, intake and output, medications and labs.  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  Uncontrolled.   Plan: continue to adjust insulin  Add 16units of NPH qhs Pt met with diabetic educator and pt has demonstrated ability to self adjust and administer insulin Continue glucose paneling and adjustment of insulin   LOS: 2 days    HARRAWAY-SMITH, Markesha Hannig 11/18/2011, 8:49 PM

## 2011-11-18 NOTE — Plan of Care (Signed)
Problem: Phase I Progression Outcomes Goal: Maintains reassuring Fetal Heart Rate Outcome: Not Applicable Date Met:  11/18/11 11 weeks IUP

## 2011-11-18 NOTE — Progress Notes (Signed)
With RN supervision and guidance, pt self administered insulin with breakfast.  Pt drew 4 units Novolog, followed by 11 units NPH into same syringe.  RN administered insulin into right arm per patient request.  For 2 hour post-prandial correction Novolog, pt drew correct amount into syringe and self-administered into left medial thigh using correct technique.  RN observed pt during entire process.  Minimal guidance to pt was required.

## 2011-11-18 NOTE — Consult Note (Signed)
Diabetes Treatment Program Recommendations  ADA Standards of Care 2012 Diabetes in Pregnancy Target Glucose Ranges:  Fasting: 60 - 90 mg/dL Preprandial: 60 - 782 mg/dL 1 hr postprandial: Less than 140mg /dL (from first bite of meal) 2 hr postprandial: Less than 120 mg/dL (from first bit of meal)   Noted fasting glucose this am at 161 mg/dL.  HS cbg at 133. Appears that she needs an increase in HS NPH. Recommend an increase to 15-16 units NPH at HS.  Will follow throughout the day. Thank you, Lenor Coffin, RN, CNS, Diabetes Coordinator [phone])

## 2011-11-19 LAB — GLUCOSE, CAPILLARY: Glucose-Capillary: 206 mg/dL — ABNORMAL HIGH (ref 70–99)

## 2011-11-19 MED ORDER — INSULIN NPH (HUMAN) (ISOPHANE) 100 UNIT/ML ~~LOC~~ SUSP
4.0000 [IU] | Freq: Once | SUBCUTANEOUS | Status: AC
  Administered 2011-11-19: 4 [IU] via SUBCUTANEOUS

## 2011-11-19 MED ORDER — INSULIN ASPART 100 UNIT/ML ~~LOC~~ SOLN
0.0000 [IU] | Freq: Every day | SUBCUTANEOUS | Status: DC
  Administered 2011-11-20: 4 [IU] via SUBCUTANEOUS

## 2011-11-19 MED ORDER — INSULIN PEN STARTER KIT
1.0000 | Freq: Once | Status: AC
  Administered 2011-11-19: 1
  Filled 2011-11-19: qty 1

## 2011-11-19 MED ORDER — INSULIN NPH (HUMAN) (ISOPHANE) 100 UNIT/ML ~~LOC~~ SUSP
15.0000 [IU] | Freq: Two times a day (BID) | SUBCUTANEOUS | Status: DC
  Administered 2011-11-19: 15 [IU] via SUBCUTANEOUS

## 2011-11-19 MED ORDER — INSULIN NPH (HUMAN) (ISOPHANE) 100 UNIT/ML ~~LOC~~ SUSP: 4.0000 [IU] | Freq: Once | SUBCUTANEOUS | Status: DC

## 2011-11-19 NOTE — Progress Notes (Addendum)
Inpatient Diabetes Program Recommendations  Diabetes Treatment Program Recommendations  ADA Standards of Care 2012 Diabetes in Pregnancy Target Glucose Ranges:  Fasting: 60 - 90 mg/dL Preprandial: 60 - 161 mg/dL 1 hr postprandial: Less than 140mg /dL (from first bite of meal) 2 hr postprandial: Less than 120 mg/dL (from first bit of meal)    Reason for Visit: Elevated glucose levels needing insulin dosing adjustment  Inpatient Diabetes Program Recommendations Insulin - Basal: Recommended an increase yesterday of NPH to 15 units am and HS.  Pt stilll at 11 units and fasting glucose still a little elevated at 134 mg/dL. Again, please consider increase to 15 unit NPH am and HS. Correction (SSI): No correction given for fasting glucose yesterday nor today.  Fasting correction discontinued before ever initiated yesterday.  I wonder if the intention was to use the 2 hr PP corrrection for the fasting glucose?  Note: Without correcting fasting glucose, it is difficult to determine if meal coverage is adequate.  If cbg is high before the meal, and only the meal is covered with insulin, then it is difficult to ascertain the affectiveness of the meal coverage.  Will call MD on call to request a fasting correction scale and increase in basal NPH doses. Thank you, Lenor Coffin, RN, CNS, Diabetes Coordinator [phone])   11:00   Spoke with Dr. Debroah Loop regarding increase in NPH and fasting correction.  Orders entered.  Additional 4 units of NPH to be given this am.

## 2011-11-19 NOTE — Progress Notes (Signed)
I visited with Karina [name] at her nurse's referral.  She was in good spirits and feels that her time in the hospital has brought about a positive change in how she views taking care of herself so that she can have a healthy baby and be a healthy mother.  She has good support and a strong faith as well.  I provided witness to her story as well as prayer for her and for her baby.  Please page as needed, [phone].  Chaplain Dyanne Carrel 11:30   11/19/11 1400  Clinical Encounter Type  Visited With Patient  Visit Type Spiritual support  Referral From Nurse  Spiritual Encounters  Spiritual Needs Prayer;Emotional  Stress Factors  Patient Stress Factors Health changes

## 2011-11-19 NOTE — Consult Note (Signed)
11-19-11 Ad: Demonstrated to pt how to use the Lilly pen since the NPH is only available in the [employer].  Pt can use the Lilly Humalog pen (instead of Novolog) if she wishes so that both pens work the same way.  Demonstrated how to use the Novolog pen as well in case she/MD chooses the Novolog.  Pt returned demonstration with accuracy and clarity. Pt may choose to use vial and syringe however if she wants to combine her am NPH and Novolog.  Informed her that it was her decision and she could let the MD know her preference.  Medicaid covers both.  Pt is extremely bright and understands carb counting, how to tweak her doses with insulin to carb ratio, and NPH with fasting glucose results. That is up to her MD as well.  Pt expressed desire to be able to use the insulin pump as well.  This pt is an excellent candidate for the insulin pump.  Told her to talk with Dr. Debroah Loop about it and she could get set up at [facility].  Presently she is making an appt with [facility] for diet education. Please feel free to call with concerns/questions.  Thank you, Lenor Coffin, RN, CNS, Diabetes Coordinator [phone])

## 2011-11-19 NOTE — Progress Notes (Signed)
Subjective: Patient reports: no complaints.  Had further questions regarding diet.   Objective: I have reviewed patient's vital signs, medications and labs.  General: alert, cooperative and no distress Extremities: extremities normal, atraumatic, no cyanosis or edema and Homans sign is negative, no sign of DVT   Assessment/Plan: Pt with uncontrolled class B DM here for insulin adjustment and diabetes control. Fasting glucose improved after adding NPH at bedtime  LOS: 3 days    [name], Karina [name] 11/19/2011, 7:37 AM

## 2011-11-19 NOTE — Progress Notes (Signed)
UR Chart review completed.  

## 2011-11-20 LAB — GLUCOSE, CAPILLARY
Glucose-Capillary: 121 mg/dL — ABNORMAL HIGH (ref 70–99)
Glucose-Capillary: 239 mg/dL — ABNORMAL HIGH (ref 70–99)

## 2011-11-20 LAB — PROTEIN, URINE, 24 HOUR
Collection Interval-UPROT: 24 hours
Protein, Urine: 3 mg/dL

## 2011-11-20 LAB — CREATININE, URINE, 24 HOUR: Collection Interval-UCRE24: 24 hours

## 2011-11-20 MED ORDER — PRENATAL MULTIVITAMIN CH: 1.0000 | ORAL_TABLET | Freq: Every day | ORAL | Status: DC

## 2011-11-20 MED ORDER — INSULIN ASPART 100 UNIT/ML ~~LOC~~ SOLN: 0.0000 [IU] | Freq: Three times a day (TID) | SUBCUTANEOUS | Status: DC

## 2011-11-20 MED ORDER — INSULIN ASPART 100 UNIT/ML ~~LOC~~ SOLN: 0.0000 [IU] | Freq: Every day | SUBCUTANEOUS | Status: DC

## 2011-11-20 MED ORDER — INSULIN NPH (HUMAN) (ISOPHANE) 100 UNIT/ML ~~LOC~~ SUSP: 18.0000 [IU] | Freq: Two times a day (BID) | SUBCUTANEOUS | Status: DC

## 2011-11-20 MED ORDER — INSULIN ASPART 100 UNIT/ML ~~LOC~~ SOLN: 4.0000 [IU] | Freq: Three times a day (TID) | SUBCUTANEOUS | Status: DC

## 2011-11-20 MED ORDER — INSULIN NPH (HUMAN) (ISOPHANE) 100 UNIT/ML ~~LOC~~ SUSP
18.0000 [IU] | Freq: Two times a day (BID) | SUBCUTANEOUS | Status: DC
  Administered 2011-11-20: 18 [IU] via SUBCUTANEOUS

## 2011-11-20 NOTE — Discharge Summary (Signed)
Physician Discharge Summary  Patient ID: Karina [name] MRN: [id number] DOB/AGE: 01-17-1985 26 y.o.  Admit date: 11/16/2011 Discharge date: 11/20/2011  Admission Diagnoses:  Discharge Diagnoses:  Principal Problem:  *Preexisting Diabetes mellitus, antepartum   Discharged Condition: good  Hospital Course: Insulin regimen adjusted and glucose control improved.  Consults: Diabetes coordinator  Significant Diagnostic Studies: labs: blood glucose  Treatments: insulin  Discharge Exam: Blood pressure 131/79, pulse 90, temperature 98.7 F (37.1 C), temperature source Oral, resp. rate 18, height 5\' 2"  (1.575 m), weight 93.396 kg (205 lb 14.4 oz), last menstrual period 09/04/2011, SpO2 97.00%. General appearance: alert, cooperative and no distress Chest wall: normal effort no respiratory distress  Disposition: 01-Home or Self Care  Discharge Orders    Future Orders Please Complete By Expires   Ambulatory referral to Nutrition and Diabetic Education      Comments:   Pt has pre-existing diabetes since 2004.  According to patient, she was put on Metformin at that time. In 2009, pt was put on Glipizide, 70/30, Lantus and Metformin combination therapy. Pt then lost her insurance due to aging out of being part of parents' insurance. Pt no longer had access to insulin or meds or test strips.  Now at [facility] in room 9305. Starting NPH and Novolog regimen under [facility] faculty. Have started initial teaching of carb counting and RD consult has been ordered.  However, MD's want pt to attend either a GDM class or 1:1 with dietician.  Pt will not need insulin nor meter instruciton.       Medication List     As of 11/20/2011  6:55 AM    TAKE these medications         acetaminophen 325 MG tablet   Commonly known as: TYLENOL   Take 650 mg by mouth every 6 (six) hours as needed.      insulin aspart 100 UNIT/ML injection   Commonly known as: novoLOG   Inject 4-7 Units into the skin 3  (three) times daily with meals.      insulin aspart 100 UNIT/ML injection   Commonly known as: novoLOG   Inject 0-21 Units into the skin 3 (three) times daily after meals.      insulin aspart 100 UNIT/ML injection   Commonly known as: novoLOG   Inject 0-24 Units into the skin daily at 6 (six) AM.      insulin NPH 100 UNIT/ML injection   Commonly known as: HUMULIN N,NOVOLIN N   Inject 18 Units into the skin 2 (two) times daily.      prenatal multivitamin Tabs   Take 1 tablet by mouth daily.      ASK your doctor about these medications         prenatal multivitamin Tabs   Take 1 tablet by mouth daily.           Follow-up Information    Follow up with [facility]. On 11/29/2011.       Insulin to be dispensed in [employer]   Signed: ARNOLD,JAMES 11/20/2011, 6:55 AM

## 2011-11-20 NOTE — Progress Notes (Signed)
Discharge instructions given to patient at bedside.  Patient demonstrated use of insulin pens and use of insulin sliding scale.  No questions at this time.  Patient left unit accompanied by staff in stable condition.  Osvaldo Angst, RN---------

## 2011-11-22 ENCOUNTER — Encounter: Payer: Self-pay | Admitting: Obstetrics & Gynecology

## 2011-12-03 ENCOUNTER — Telehealth: Payer: Self-pay | Admitting: *Deleted

## 2011-12-03 MED ORDER — GLUCOSE BLOOD VI STRP: ORAL_STRIP | Status: DC

## 2011-12-03 NOTE — Telephone Encounter (Signed)
Received message from "Casimiro Needle" stating that pt needs test strips Rx sent to her pharmacy- [facility] on [address].  I returned the call to pt and verified the name of her meter.  I stated that I will send in Rx for the test strips.  Pt voiced understanding.

## 2011-12-08 ENCOUNTER — Encounter: Payer: Self-pay | Admitting: *Deleted

## 2011-12-08 ENCOUNTER — Encounter: Payer: Medicaid Other | Attending: Obstetrics & Gynecology | Admitting: *Deleted

## 2011-12-08 VITALS — Ht 62.0 in | Wt 215.5 lb

## 2011-12-08 DIAGNOSIS — Z713 Dietary counseling and surveillance: Secondary | ICD-10-CM | POA: Insufficient documentation

## 2011-12-08 DIAGNOSIS — O24919 Unspecified diabetes mellitus in pregnancy, unspecified trimester: Secondary | ICD-10-CM | POA: Insufficient documentation

## 2011-12-08 DIAGNOSIS — E119 Type 2 diabetes mellitus without complications: Secondary | ICD-10-CM | POA: Insufficient documentation

## 2011-12-08 NOTE — Patient Instructions (Signed)
Goals:  Check glucose levels per MD as instructed  Follow Gestational Diabetes Diet as instructed  Call for follow-up as needed    

## 2011-12-08 NOTE — Progress Notes (Signed)
  Patient was seen on 12/08/2011 for Gestational Diabetes self-management class [facility]. The following learning objectives were met by the patient during this course:   States the definition of Gestational Diabetes  States why dietary management is important in controlling blood glucose  Describes the effects each nutrient has on blood glucose levels  Demonstrates ability to create a balanced meal plan  Demonstrates carbohydrate counting   States when to check blood glucose levels  Demonstrates proper blood glucose monitoring techniques  States the effect of stress and exercise on blood glucose levels  States the importance of limiting caffeine and abstaining from alcohol and smoking  Blood glucose monitor given: Accu Chek Nano BG Monitoring Kit Lot # [id number] Exp: 12/10/2012 Blood glucose reading: 106 mg/dl  Patient instructed to monitor glucose levels: FBS: 60 - <90 2 hour: <120  *Patient received handouts:  Nutrition Diabetes and Pregnancy  Carbohydrate Counting List  Patient will be seen for follow-up as needed.

## 2011-12-16 ENCOUNTER — Other Ambulatory Visit: Payer: Self-pay | Admitting: *Deleted

## 2011-12-16 MED ORDER — INSULIN PEN NEEDLE 32G X 4 MM MISC: Status: DC

## 2011-12-16 NOTE — Telephone Encounter (Signed)
Pt left message requesting refill of pen needles (short).  I verified the product with pharmacist @ [facility] and placed order. I called pt and left message on pt's personal voice mail that she may pick up her Rx today.

## 2011-12-20 ENCOUNTER — Encounter: Payer: Medicaid Other | Attending: Obstetrics & Gynecology | Admitting: Dietician

## 2011-12-20 ENCOUNTER — Encounter: Payer: Self-pay | Admitting: Obstetrics & Gynecology

## 2011-12-20 ENCOUNTER — Other Ambulatory Visit (HOSPITAL_COMMUNITY)
Admission: RE | Admit: 2011-12-20 | Discharge: 2011-12-20 | Disposition: A | Discharge status: Home / Self Care | Payer: Medicaid Other | Source: Ambulatory Visit | Attending: Obstetrics & Gynecology | Admitting: Obstetrics & Gynecology

## 2011-12-20 ENCOUNTER — Ambulatory Visit (INDEPENDENT_AMBULATORY_CARE_PROVIDER_SITE_OTHER): Payer: Medicaid Other | Admitting: Obstetrics & Gynecology

## 2011-12-20 VITALS — BP 106/69 | Temp 98.4°F | Wt 215.7 lb

## 2011-12-20 DIAGNOSIS — Z23 Encounter for immunization: Secondary | ICD-10-CM

## 2011-12-20 DIAGNOSIS — O24919 Unspecified diabetes mellitus in pregnancy, unspecified trimester: Secondary | ICD-10-CM | POA: Insufficient documentation

## 2011-12-20 DIAGNOSIS — O0990 Supervision of high risk pregnancy, unspecified, unspecified trimester: Secondary | ICD-10-CM | POA: Insufficient documentation

## 2011-12-20 DIAGNOSIS — Z113 Encounter for screening for infections with a predominantly sexual mode of transmission: Secondary | ICD-10-CM | POA: Insufficient documentation

## 2011-12-20 DIAGNOSIS — Z713 Dietary counseling and surveillance: Secondary | ICD-10-CM | POA: Insufficient documentation

## 2011-12-20 DIAGNOSIS — E119 Type 2 diabetes mellitus without complications: Secondary | ICD-10-CM | POA: Insufficient documentation

## 2011-12-20 LAB — POCT URINALYSIS DIP (DEVICE)
Bilirubin Urine: NEGATIVE
Ketones, ur: NEGATIVE mg/dL
Leukocytes, UA: NEGATIVE
Protein, ur: NEGATIVE mg/dL

## 2011-12-20 LAB — GLUCOSE, CAPILLARY: Glucose-Capillary: 83 mg/dL (ref 70–99)

## 2011-12-20 LAB — HEPATITIS B SURFACE ANTIGEN: Hepatitis B Surface Ag: NEGATIVE

## 2011-12-20 LAB — OB RESULTS CONSOLE GBS: GBS: POSITIVE

## 2011-12-20 MED ORDER — INFLUENZA VIRUS VACC SPLIT PF IM SUSP
0.5000 mL | Freq: Once | INTRAMUSCULAR | Status: AC
  Administered 2011-12-20: 0.5 mL via INTRAMUSCULAR

## 2011-12-20 NOTE — Progress Notes (Signed)
Diabetes Education:  History of Type 2 DM.  On insulin prior to pregnancy and not well controlled.  Noted that she was without insulin and could not afford the insulin and her father who has DM would give her Novolog to use.  Currently, she is on Medicaid and is to go on her husbands insurance once the pregnancy is complete.  She has seen Ivonne Andrew RD at [facility] for review of the GDM diet.  Review of blood glucose testing and goals.  Review of the diet, hypoglycemia.  Encouraged to aim for regular 30 minutes of walking each day and work to take insulin appropriately.  Will plan to f/u at later visits.  Maggie Yomara Toothman, RN, RD, CDE

## 2011-12-20 NOTE — Progress Notes (Signed)
P=89 , c/o edema in feet/ankles, Discussed bmi, and appropriate weight gain, given new pregnancy information

## 2011-12-20 NOTE — Progress Notes (Signed)
Per patient--fastings 95-105; 2 hours pp breakfast--150 Increase NPH to 20; Patient on sliding scale and sometimes takes no insulin before meals ???; will rework insulin on weight based with set amounts to give before each meal.  Before breakfast 6 units, before dinner 10 units, before dinner 10 units.  Keep NPH at 18 in the morning.    Subjective:    Karina [name] is a G1P0 [redacted]w[redacted]d being seen today for her first obstetrical visit.  Her obstetrical history is significant for diabetes. Patient does intend to breast feed. Pregnancy history fully reviewed.  Patient reports no complaints.  Filed Vitals:   12/20/11 0845  BP: 106/69  Temp: 98.4 F (36.9 C)  Weight: 215 lb 11.2 oz (97.841 kg)    HISTORY: OB History    Grav Para Term Preterm Abortions TAB SAB Ect Mult Living   1              # Outc Date GA Lbr Len/2nd Wgt Sex Del Anes PTL Lv   1 CUR              Past Medical History  Diagnosis Date  . Diabetes mellitus    Past Surgical History  Procedure Date  . Cervical biopsy 2004   Family History  Problem Relation Age of Onset  . Diabetes Father      Exam    Uterus:     Pelvic Exam:    Perineum: No Hemorrhoids   Vulva: normal   Vagina:  normal mucosa, normal discharge   pH: N/a   Cervix: no lesions and nulliparous appearance   Adnexa: no mass, fullness, tenderness   Bony Pelvis: average  System: Breast:  normal appearance, no masses or tenderness   Skin: normal coloration and turgor, no rashes    Neurologic: oriented   Extremities: no deformities   HEENT extra ocular movement intact   Mouth/Teeth mucous membranes moist, pharynx normal without lesions   Neck supple and no masses   Cardiovascular: regular rate and rhythm   Respiratory:  chest clear, no wheezing, crepitations, rhonchi, normal symmetric air entry   Abdomen: soft, non-tender; bowel sounds normal; no masses,  no organomegaly   Urinary: urethral meatus normal      Assessment:    Pregnancy:  G1P0 Patient Active Problem List  Diagnosis  . Preexisting Diabetes mellitus, antepartum        Plan:     Initial labs drawn. Prenatal vitamins. Problem list reviewed and updated. Genetic Screening discussed Quad Screen: requested.  Ultrasound discussed; fetal survey: requested.  Follow up in 1 weeks.  See notes about insulin change.   Olive Zmuda H. 12/20/2011

## 2011-12-20 NOTE — Progress Notes (Signed)
Nutrition Note:  1st visit.   Type 2 DM.  On NPH and Novolog Clear.  Pt reports not checking BS consistently, but when she does, fasting is 105-110 and after meals is 150.  Obese prior to pregnancy with excessive wt. Gain.  PNV daily.   Exercises some days.  Eats 3 meals & 1-2 snacks/d.  Drinks soy milk.  No food allergies.  Plans to breastfeed. WIC certification started today.  Discussed wt. Gain goals of 11-20#.  Discussed importance of frequent meals/snacks with CHO:PRO combinations.   F/U 2-4 weeks. Candice C. Earlene Plater, MPH, RD, LDN

## 2011-12-20 NOTE — Patient Instructions (Signed)
Pregnancy - Second Trimester The second trimester of pregnancy (3 to 6 months) is a period of rapid growth for you and your baby. At the end of the sixth month, your baby is about 9 inches long and weighs 1 1/2 pounds. You will begin to feel the baby move between 18 and 20 weeks of the pregnancy. This is called quickening. Weight gain is faster. A clear fluid (colostrum) may leak out of your breasts. You may feel small contractions of the womb (uterus). This is known as false labor or Braxton-Hicks contractions. This is like a practice for labor when the baby is ready to be born. Usually, the problems with morning sickness have usually passed by the end of your first trimester. Some women develop small dark blotches (called cholasma, mask of pregnancy) on their face that usually goes away after the baby is born. Exposure to the sun makes the blotches worse. Acne may also develop in some pregnant women and pregnant women who have acne, may find that it goes away. PRENATAL EXAMS  Blood work may continue to be done during prenatal exams. These tests are done to check on your health and the probable health of your baby. Blood work is used to follow your blood levels (hemoglobin). Anemia (low hemoglobin) is common during pregnancy. Iron and vitamins are given to help prevent this. You will also be checked for diabetes between 24 and 28 weeks of the pregnancy. Some of the previous blood tests may be repeated.  The size of the uterus is measured during each visit. This is to make sure that the baby is continuing to grow properly according to the dates of the pregnancy.  Your blood pressure is checked every prenatal visit. This is to make sure you are not getting toxemia.  Your urine is checked to make sure you do not have an infection, diabetes or protein in the urine.  Your weight is checked often to make sure gains are happening at the suggested rate. This is to ensure that both you and your baby are growing  normally.  Sometimes, an ultrasound is performed to confirm the proper growth and development of the baby. This is a test which bounces harmless sound waves off the baby so your caregiver can more accurately determine due dates. Sometimes, a specialized test is done on the amniotic fluid surrounding the baby. This test is called an amniocentesis. The amniotic fluid is obtained by sticking a needle into the belly (abdomen). This is done to check the chromosomes in instances where there is a concern about possible genetic problems with the baby. It is also sometimes done near the end of pregnancy if an early delivery is required. In this case, it is done to help make sure the baby's lungs are mature enough for the baby to live outside of the womb. CHANGES OCCURING IN THE SECOND TRIMESTER OF PREGNANCY Your body goes through many changes during pregnancy. They vary from person to person. Talk to your caregiver about changes you notice that you are concerned about.  During the second trimester, you will likely have an increase in your appetite. It is normal to have cravings for certain foods. This varies from person to person and pregnancy to pregnancy.  Your lower abdomen will begin to bulge.  You may have to urinate more often because the uterus and baby are pressing on your bladder. It is also common to get more bladder infections during pregnancy (pain with urination). You can help this by   drinking lots of fluids and emptying your bladder before and after intercourse.  You may begin to get stretch marks on your hips, abdomen, and breasts. These are normal changes in the body during pregnancy. There are no exercises or medications to take that prevent this change.  You may begin to develop swollen and bulging veins (varicose veins) in your legs. Wearing support hose, elevating your feet for 15 minutes, 3 to 4 times a day and limiting salt in your diet helps lessen the problem.  Heartburn may develop  as the uterus grows and pushes up against the stomach. Antacids recommended by your caregiver helps with this problem. Also, eating smaller meals 4 to 5 times a day helps.  Constipation can be treated with a stool softener or adding bulk to your diet. Drinking lots of fluids, vegetables, fruits, and whole grains are helpful.  Exercising is also helpful. If you have been very active up until your pregnancy, most of these activities can be continued during your pregnancy. If you have been less active, it is helpful to start an exercise program such as walking.  Hemorrhoids (varicose veins in the rectum) may develop at the end of the second trimester. Warm sitz baths and hemorrhoid cream recommended by your caregiver helps hemorrhoid problems.  Backaches may develop during this time of your pregnancy. Avoid heavy lifting, wear low heal shoes and practice good posture to help with backache problems.  Some pregnant women develop tingling and numbness of their hand and fingers because of swelling and tightening of ligaments in the wrist (carpel tunnel syndrome). This goes away after the baby is born.  As your breasts enlarge, you may have to get a bigger bra. Get a comfortable, cotton, support bra. Do not get a nursing bra until the last month of the pregnancy if you will be nursing the baby.  You may get a dark line from your belly button to the pubic area called the linea nigra.  You may develop rosy cheeks because of increase blood flow to the face.  You may develop spider looking lines of the face, neck, arms and chest. These go away after the baby is born. HOME CARE INSTRUCTIONS   It is extremely important to avoid all smoking, herbs, alcohol, and unprescribed drugs during your pregnancy. These chemicals affect the formation and growth of the baby. Avoid these chemicals throughout the pregnancy to ensure the delivery of a healthy infant.  Most of your home care instructions are the same as  suggested for the first trimester of your pregnancy. Keep your caregiver's appointments. Follow your caregiver's instructions regarding medication use, exercise and diet.  During pregnancy, you are providing food for you and your baby. Continue to eat regular, well-balanced meals. Choose foods such as meat, fish, milk and other low fat dairy products, vegetables, fruits, and whole-grain breads and cereals. Your caregiver will tell you of the ideal weight gain.  A physical sexual relationship may be continued up until near the end of pregnancy if there are no other problems. Problems could include early (premature) leaking of amniotic fluid from the membranes, vaginal bleeding, abdominal pain, or other medical or pregnancy problems.  Exercise regularly if there are no restrictions. Check with your caregiver if you are unsure of the safety of some of your exercises. The greatest weight gain will occur in the last 2 trimesters of pregnancy. Exercise will help you:  Control your weight.  Get you in shape for labor and delivery.  Lose weight   after you have the baby.  Wear a good support or jogging bra for breast tenderness during pregnancy. This may help if worn during sleep. Pads or tissues may be used in the bra if you are leaking colostrum.  Do not use hot tubs, steam rooms or saunas throughout the pregnancy.  Wear your seat belt at all times when driving. This protects you and your baby if you are in an accident.  Avoid raw meat, uncooked cheese, cat litter boxes and soil used by cats. These carry germs that can cause birth defects in the baby.  The second trimester is also a good time to visit your dentist for your dental health if this has not been done yet. Getting your teeth cleaned is OK. Use a soft toothbrush. Brush gently during pregnancy.  It is easier to loose urine during pregnancy. Tightening up and strengthening the pelvic muscles will help with this problem. Practice stopping your  urination while you are going to the bathroom. These are the same muscles you need to strengthen. It is also the muscles you would use as if you were trying to stop from passing gas. You can practice tightening these muscles up 10 times a set and repeating this about 3 times per day. Once you know what muscles to tighten up, do not perform these exercises during urination. It is more likely to contribute to an infection by backing up the urine.  Ask for help if you have financial, counseling or nutritional needs during pregnancy. Your caregiver will be able to offer counseling for these needs as well as refer you for other special needs.  Your skin may become oily. If so, wash your face with mild soap, use non-greasy moisturizer and oil or cream based makeup. MEDICATIONS AND DRUG USE IN PREGNANCY  Take prenatal vitamins as directed. The vitamin should contain 1 milligram of folic acid. Keep all vitamins out of reach of children. Only a couple vitamins or tablets containing iron may be fatal to a baby or young child when ingested.  Avoid use of all medications, including herbs, over-the-counter medications, not prescribed or suggested by your caregiver. Only take over-the-counter or prescription medicines for pain, discomfort, or fever as directed by your caregiver. Do not use aspirin.  Let your caregiver also know about herbs you may be using.  Alcohol is related to a number of birth defects. This includes fetal alcohol syndrome. All alcohol, in any form, should be avoided completely. Smoking will cause low birth rate and premature babies.  Street or illegal drugs are very harmful to the baby. They are absolutely forbidden. A baby born to an addicted mother will be addicted at birth. The baby will go through the same withdrawal an adult does. SEEK MEDICAL CARE IF:  You have any concerns or worries during your pregnancy. It is better to call with your questions if you feel they cannot wait, rather  than worry about them. SEEK IMMEDIATE MEDICAL CARE IF:   An unexplained oral temperature above 102 F (38.9 C) develops, or as your caregiver suggests.  You have leaking of fluid from the vagina (birth canal). If leaking membranes are suspected, take your temperature and tell your caregiver of this when you call.  There is vaginal spotting, bleeding, or passing clots. Tell your caregiver of the amount and how many pads are used. Light spotting in pregnancy is common, especially following intercourse.  You develop a bad smelling vaginal discharge with a change in the color from clear   to white.  You continue to feel sick to your stomach (nauseated) and have no relief from remedies suggested. You vomit blood or coffee ground-like materials.  You lose more than 2 pounds of weight or gain more than 2 pounds of weight over 1 week, or as suggested by your caregiver.  You notice swelling of your face, hands, feet, or legs.  You get exposed to German measles and have never had them.  You are exposed to fifth disease or chickenpox.  You develop belly (abdominal) pain. Round ligament discomfort is a common non-cancerous (benign) cause of abdominal pain in pregnancy. Your caregiver still must evaluate you.  You develop a bad headache that does not go away.  You develop fever, diarrhea, pain with urination, or shortness of breath.  You develop visual problems, blurry, or double vision.  You fall or are in a car accident or any kind of trauma.  There is mental or physical violence at home. Document Released: 12/22/2000 Document Revised: 03/22/2011 Document Reviewed: 06/26/2008 [employer] Patient Information 2013 [employer].  

## 2011-12-23 LAB — CULTURE, OB URINE: Colony Count: 25000

## 2011-12-25 ENCOUNTER — Encounter: Payer: Self-pay | Admitting: Obstetrics & Gynecology

## 2011-12-25 DIAGNOSIS — B951 Streptococcus, group B, as the cause of diseases classified elsewhere: Secondary | ICD-10-CM | POA: Insufficient documentation

## 2011-12-25 DIAGNOSIS — O2340 Unspecified infection of urinary tract in pregnancy, unspecified trimester: Secondary | ICD-10-CM

## 2011-12-27 ENCOUNTER — Ambulatory Visit (INDEPENDENT_AMBULATORY_CARE_PROVIDER_SITE_OTHER): Payer: Medicaid Other | Admitting: Family

## 2011-12-27 ENCOUNTER — Other Ambulatory Visit (HOSPITAL_COMMUNITY)
Admission: RE | Admit: 2011-12-27 | Discharge: 2011-12-27 | Disposition: A | Discharge status: Home / Self Care | Payer: Medicaid Other | Source: Ambulatory Visit | Attending: Obstetrics and Gynecology | Admitting: Obstetrics and Gynecology

## 2011-12-27 ENCOUNTER — Encounter: Payer: Self-pay | Admitting: Family

## 2011-12-27 VITALS — BP 125/73 | Temp 98.1°F | Wt 216.0 lb

## 2011-12-27 DIAGNOSIS — O99810 Abnormal glucose complicating pregnancy: Secondary | ICD-10-CM

## 2011-12-27 DIAGNOSIS — N760 Acute vaginitis: Secondary | ICD-10-CM | POA: Insufficient documentation

## 2011-12-27 DIAGNOSIS — O24419 Gestational diabetes mellitus in pregnancy, unspecified control: Secondary | ICD-10-CM

## 2011-12-27 LAB — POCT URINALYSIS DIP (DEVICE)
Bilirubin Urine: NEGATIVE
Hgb urine dipstick: NEGATIVE
Ketones, ur: NEGATIVE mg/dL
Nitrite: NEGATIVE
Protein, ur: NEGATIVE mg/dL
Urobilinogen, UA: 1 mg/dL (ref 0.0–1.0)

## 2011-12-27 NOTE — Progress Notes (Signed)
FBS poor tracking of blood sugars 120-246; spot postprandials 146-248; reviewed with Dr. Shawnie Pons, recalculated weight based requirements; change NPH to 15 BID and novolog to 15 w/ each meal; stressed importance in checking blood sugar 4x/day; obtain rubella at next visit.

## 2011-12-27 NOTE — Progress Notes (Signed)
Pulse 87. Edema trace in feet. No c/o pain; pressure in pelvic. Vaginal d/c as thick grey in color with itch.

## 2011-12-27 NOTE — Addendum Note (Signed)
Addended by: Kathee Delton on: 12/27/2011 01:40 PM   Modules accepted: Orders

## 2011-12-30 ENCOUNTER — Other Ambulatory Visit: Payer: Self-pay | Admitting: Family

## 2011-12-30 MED ORDER — METRONIDAZOLE 500 MG PO TABS: 500.0000 mg | ORAL_TABLET | Freq: Two times a day (BID) | ORAL | Status: DC

## 2011-12-30 NOTE — Progress Notes (Signed)
Pt +gardenella on wet prep > RX sent to pharmacy.

## 2011-12-31 ENCOUNTER — Telehealth: Payer: Self-pay | Admitting: General Practice

## 2011-12-31 NOTE — Telephone Encounter (Signed)
Called patient and left message to give us a call back at the clinics 

## 2011-12-31 NOTE — Telephone Encounter (Signed)
Patient called back and I informed her of BV on recent wet prep and that flagyl was called in to a [facility] pharmacy and that she should take that twice a day for 7 days. Patient verbalized understanding and had no further questions

## 2011-12-31 NOTE — Telephone Encounter (Signed)
Message copied by Kathee Delton on Fri Dec 31, 2011  8:18 AM ------      Message from: Melissa Noon      Created: Thu Dec 30, 2011  2:18 PM       Notify pt regarding RX for BV at pharmacy.

## 2012-01-03 ENCOUNTER — Encounter: Payer: Self-pay | Admitting: Family Medicine

## 2012-01-03 ENCOUNTER — Ambulatory Visit (INDEPENDENT_AMBULATORY_CARE_PROVIDER_SITE_OTHER): Payer: Medicaid Other | Admitting: Family Medicine

## 2012-01-03 ENCOUNTER — Encounter: Payer: Self-pay | Admitting: Obstetrics & Gynecology

## 2012-01-03 VITALS — BP 103/73 | Temp 98.4°F | Wt 216.6 lb

## 2012-01-03 DIAGNOSIS — O9989 Other specified diseases and conditions complicating pregnancy, childbirth and the puerperium: Secondary | ICD-10-CM

## 2012-01-03 DIAGNOSIS — O24919 Unspecified diabetes mellitus in pregnancy, unspecified trimester: Secondary | ICD-10-CM

## 2012-01-03 DIAGNOSIS — Z2839 Other underimmunization status: Secondary | ICD-10-CM | POA: Insufficient documentation

## 2012-01-03 DIAGNOSIS — Z283 Underimmunization status: Secondary | ICD-10-CM | POA: Insufficient documentation

## 2012-01-03 LAB — POCT URINALYSIS DIP (DEVICE)
Hgb urine dipstick: NEGATIVE
Leukocytes, UA: NEGATIVE
Nitrite: NEGATIVE
Protein, ur: NEGATIVE mg/dL
Urobilinogen, UA: 0.2 mg/dL (ref 0.0–1.0)
pH: 7 (ref 5.0–8.0)

## 2012-01-03 NOTE — Progress Notes (Signed)
U/S with MFM scheduled 01/14/12 at 315pm.

## 2012-01-03 NOTE — Progress Notes (Signed)
FBS 78-129 2hr pp 75-204 improved Still working on BS--will add SSI with BS check prior to meals.

## 2012-01-03 NOTE — Progress Notes (Signed)
P-83 

## 2012-01-03 NOTE — Patient Instructions (Signed)
Gestational Diabetes Mellitus Gestational diabetes mellitus (GDM) is diabetes that occurs only during pregnancy. This happens when the body cannot properly handle the glucose (sugar) that increases in the blood after eating. During pregnancy, insulin resistance (reduced sensitivity to insulin) occurs because of the release of hormones from the placenta. Usually, the pancreas of pregnant women produces enough insulin to overcome the resistance that occurs. However, in gestational diabetes, the insulin is there but it does not work effectively. If the resistance is severe enough that the pancreas does not produce enough insulin, extra glucose builds up in the blood.  WHO IS AT RISK FOR DEVELOPING GESTATIONAL DIABETES?  Women with a history of diabetes in the family.  Women over age 25.  Women who are overweight.  Women in certain ethnic groups (Hispanic, African American, Native American, Asian and Pacific Islander). WHAT CAN HAPPEN TO THE BABY? If the mother's blood glucose is too high while she is pregnant, the extra sugar will travel through the umbilical cord to the baby. Some of the problems the baby may have are:  Large Baby - If the baby receives too much sugar, the baby will gain more weight. This may cause the baby to be too large to be born normally (vaginally) and a Cesarean section (C-section) may be needed.  Low Blood Glucose (hypoglycemia)  The baby makes extra insulin, in response to the extra sugar its gets from its mother. When the baby is born and no longer needs this extra insulin, the baby's blood glucose level may drop.  Jaundice (yellow coloring of the skin and eyes)  This is fairly common in babies. It is caused from a build-up of the chemical called bilirubin. This is rarely serious, but is seen more often in babies whose mothers had gestational diabetes. RISKS TO THE MOTHER Women who have had gestational diabetes may be at higher risk for some problems,  including:  Preeclampsia or toxemia, which includes problems with high blood pressure. Blood pressure and protein levels in the urine must be checked frequently.  Infections.  Cesarean section (C-section) for delivery.  Developing Type 2 diabetes later in life. About 30-50% will develop diabetes later, especially if obese. DIAGNOSIS  The hormones that cause insulin resistance are highest at about 24-28 weeks of pregnancy. If symptoms are experienced, they are much like symptoms you would normally expect during pregnancy.  GDM is often diagnosed using a two part method: 1. After 24-28 weeks of pregnancy, the woman drinks a glucose solution and takes a blood test. If the glucose level is high, a second test will be given. 2. Oral Glucose Tolerance Test (OGTT) which is 3 hours long  After not eating overnight, the blood glucose is checked. The woman drinks a glucose solution, and hourly blood glucose tests are taken. If the woman has risk factors for GDM, the caregiver may test earlier than 24 weeks of pregnancy. TREATMENT  Treatment of GDM is directed at keeping the mother's blood glucose level normal, and may include:  Meal planning.  Taking insulin or other medicine to control your blood glucose level.  Exercise.  Keeping a daily record of the foods you eat.  Blood glucose monitoring and keeping a record of your blood glucose levels.  May monitor ketone levels in the urine, although this is no longer considered necessary in most pregnancies. HOME CARE INSTRUCTIONS  While you are pregnant:  Follow your caregiver's advice regarding your prenatal appointments, meal planning, exercise, medicines, vitamins, blood and other tests, and physical   activities.  Keep a record of your meals, blood glucose tests, and the amount of insulin you are taking (if any). Show this to your caregiver at every prenatal visit.  If you have GDM, you may have problems with hypoglycemia (low blood glucose).  You may suspect this if you become suddenly dizzy, feel shaky, and/or weak. If you think this is happening and you have a glucose meter, try to test your blood glucose level. Follow your caregiver's advice for when and how to treat your low blood glucose. Generally, the 15:15 rule is followed: Treat by consuming 15 grams of carbohydrates, wait 15 minutes, and recheck blood glucose. Examples of 15 grams of carbohydrates are:  1 cup skim or low-fat milk.   cup juice.  3-4 glucose tablets.  5-6 hard candies.  1 small box raisins.   cup regular soda pop.  Practice good hygiene, to avoid infections.  Do not smoke. SEEK MEDICAL CARE IF:   You develop abnormal vaginal discharge, with or without itching.  You become weak and tired more than expected.  You seem to sweat a lot.  You have a sudden increase in weight, 5 pounds or more in one week.  You are losing weight, 3 pounds or more in a week.  Your blood glucose level is high, and you need instructions on what to do about it. SEEK IMMEDIATE MEDICAL CARE IF:   You develop a severe headache.  You faint or pass out.  You develop nausea and vomiting.  You become disoriented or confused.  You have a convulsion.  You develop vision problems.  You develop stomach pain.  You develop vaginal bleeding.  You develop uterine contractions.  You have leaking or a gush of fluid from the vagina. AFTER YOU HAVE THE BABY:  Go to all of your follow-up appointments, and have blood tests as advised by your caregiver.  Maintain a healthy lifestyle, to prevent diabetes in the future. This includes:  Following a healthy meal plan.  Controlling your weight.  Getting enough exercise and proper rest.  Do not smoke.  Breastfeed your baby if you can. This will lower the chance of you and your baby developing diabetes later in life. For more information about diabetes, go to the American Diabetes Association at:  www.americandiabetesassociation.org. For more information about gestational diabetes, go to the American Congress of Obstetricians and Gynecologists at: www.acog.org. Document Released: 04/05/2000 Document Revised: 03/22/2011 Document Reviewed: 10/28/2008 Exit[employer] Patient Information 2013 [employer].  Breastfeeding Deciding to breastfeed is one of the best choices you can make for you and your baby. The information that follows gives a brief overview of the benefits of breastfeeding as well as common topics surrounding breastfeeding. BENEFITS OF BREASTFEEDING For the baby  The first milk (colostrum) helps the baby's digestive system function better.   There are antibodies in the mother's milk that help the baby fight off infections.   The baby has a lower incidence of asthma, allergies, and sudden infant death syndrome (SIDS).   The nutrients in breast milk are better for the baby than infant formulas, and breast milk helps the baby's brain grow better.   Babies who breastfeed have less gas, colic, and constipation.  For the mother  Breastfeeding helps develop a very special bond between the mother and her baby.   Breastfeeding is convenient, always available at the correct temperature, and costs nothing.   Breastfeeding burns calories in the mother and helps her lose weight that was gained during pregnancy.     Breastfeeding makes the uterus contract back down to normal size faster and slows bleeding following delivery.   Breastfeeding mothers have a lower risk of developing breast cancer.  BREASTFEEDING FREQUENCY  A healthy, full-term baby may breastfeed as often as every hour or space his or her feedings to every 3 hours.   Watch your baby for signs of hunger. Nurse your baby if he or she shows signs of hunger. How often you nurse will vary from baby to baby.   Nurse as often as the baby requests, or when you feel the need to reduce the fullness of your breasts.    Awaken the baby if it has been 3 4 hours since the last feeding.   Frequent feeding will help the mother make more milk and will help prevent problems, such as sore nipples and engorgement of the breasts.  BABY'S POSITION AT THE BREAST  Whether lying down or sitting, be sure that the baby's tummy is facing your tummy.   Support the breast with 4 fingers underneath the breast and the thumb above. Make sure your fingers are well away from the nipple and baby's mouth.   Stroke the baby's lips gently with your finger or nipple.   When the baby's mouth is open wide enough, place all of your nipple and as much of the areola as possible into your baby's mouth.   Pull the baby in close so the tip of the nose and the baby's cheeks touch the breast during the feeding.  FEEDINGS AND SUCTION  The length of each feeding varies from baby to baby and from feeding to feeding.   The baby must suck about 2 3 minutes for your milk to get to him or her. This is called a "let down." For this reason, allow the baby to feed on each breast as long as he or she wants. Your baby will end the feeding when he or she has received the right balance of nutrients.   To break the suction, put your finger into the corner of the baby's mouth and slide it between his or her gums before removing your breast from his or her mouth. This will help prevent sore nipples.  HOW TO TELL WHETHER YOUR BABY IS GETTING ENOUGH BREAST MILK. Wondering whether or not your baby is getting enough milk is a common concern among mothers. You can be assured that your baby is getting enough milk if:   Your baby is actively sucking and you hear swallowing.   Your baby seems relaxed and satisfied after a feeding.   Your baby nurses at least 8 12 times in a 24 hour time period. Nurse your baby until he or she unlatches or falls asleep at the first breast (at least 10 20 minutes), then offer the second side.   Your baby is wetting  5 6 disposable diapers (6 8 cloth diapers) in a 24 hour period by 5 6 days of age.   Your baby is having at least 3 4 stools every 24 hours for the first 6 weeks. The stool should be soft and yellow.   Your baby should gain 4 7 ounces per week after he or she is 4 days old.   Your breasts feel softer after nursing.  REDUCING BREAST ENGORGEMENT  In the first week after your baby is born, you may experience signs of breast engorgement. When breasts are engorged, they feel heavy, warm, full, and may be tender to the touch. You can reduce   engorgement if you:   Nurse frequently, every 2 3 hours. Mothers who breastfeed early and often have fewer problems with engorgement.   Place light ice packs on your breasts for 10 20 minutes between feedings. This reduces swelling. Wrap the ice packs in a lightweight towel to protect your skin. Bags of frozen vegetables work well for this purpose.   Take a warm shower or apply warm, moist heat to your breast for 5 10 minutes just before each feeding. This increases circulation and helps the milk flow.   Gently massage your breast before and during the feeding. Using your finger tips, massage from the chest wall towards your nipple in a circular motion.   Make sure that the baby empties at least one breast at every feeding before switching sides.   Use a breast pump to empty the breasts if your baby is sleepy or not nursing well. You may also want to pump if you are returning to work oryou feel you are getting engorged.   Avoid bottle feeds, pacifiers, or supplemental feedings of water or juice in place of breastfeeding. Breast milk is all the food your baby needs. It is not necessary for your baby to have water or formula. In fact, to help your breasts make more milk, it is best not to give your baby supplemental feedings during the early weeks.   Be sure the baby is latched on and positioned properly while breastfeeding.   Wear a supportive  bra, avoiding underwire styles.   Eat a balanced diet with enough fluids.   Rest often, relax, and take your prenatal vitamins to prevent fatigue, stress, and anemia.  If you follow these suggestions, your engorgement should improve in 24 48 hours. If you are still experiencing difficulty, call your lactation consultant or caregiver.  CARING FOR YOURSELF Take care of your breasts  Bathe or shower daily.   Avoid using soap on your nipples.   Start feedings on your left breast at one feeding and on your right breast at the next feeding.   You will notice an increase in your milk supply 2 5 days after delivery. You may feel some discomfort from engorgement, which makes your breasts very firm and often tender. Engorgement "peaks" out within 24 48 hours. In the meantime, apply warm moist towels to your breasts for 5 10 minutes before feeding. Gentle massage and expression of some milk before feeding will soften your breasts, making it easier for your baby to latch on.   Wear a well-fitting nursing bra, and air dry your nipples for a 3 4minutes after each feeding.   Only use cotton bra pads.   Only use pure lanolin on your nipples after nursing. You do not need to wash it off before feeding the baby again. Another option is to express a few drops of breast milk and gently massage it into your nipples.  Take care of yourself  Eat well-balanced meals and nutritious snacks.   Drinking milk, fruit juice, and water to satisfy your thirst (about 8 glasses a day).   Get plenty of rest.  Avoid foods that you notice affect the baby in a bad way.  SEEK MEDICAL CARE IF:   You have difficulty with breastfeeding and need help.   You have a hard, red, sore area on your breast that is accompanied by a fever.   Your baby is too sleepy to eat well or is having trouble sleeping.   Your baby is wetting less than   6 diapers a day, by 5 days of age.   Your baby's skin or white part of  his or her eyes is more yellow than it was in the hospital.   You feel depressed.  Document Released: 12/28/2004 Document Revised: 06/29/2011 Document Reviewed: 03/28/2011 [employer] Patient Information 2013 [employer].  

## 2012-01-10 ENCOUNTER — Encounter: Payer: Medicaid Other | Admitting: Obstetrics & Gynecology

## 2012-01-10 ENCOUNTER — Telehealth: Payer: Self-pay | Admitting: *Deleted

## 2012-01-10 NOTE — Telephone Encounter (Signed)
Called patient and told her I received her message about wanting to know what she can take for a cold and asked if she had any high blood pressure. Patient stated no, and I told her she could take Benadryl, Tylenol Allergy, Tylenol Sinus or plain robitussin and to look at the ingredients on the back to contain the words diphenhydramine, guaifenesin, dextromethorphan or pseudoephedrine(spelled out to the patient the names). Patient verbalized understanding and asked if Mucinex was okay as well as buying an off brand. Told patient regular mucinex was okay to take and that she can buy the off brand but to make sure it contains those ingredients I listed to her and that once she gets to the pharmacy if she has any questions she can always call us back. Patient verbalized understanding and had no further questions

## 2012-01-10 NOTE — Telephone Encounter (Signed)
Patient left a message inquiring about what she can take for a cold.

## 2012-01-14 ENCOUNTER — Ambulatory Visit (HOSPITAL_COMMUNITY)
Admission: RE | Admit: 2012-01-14 | Discharge: 2012-01-14 | Disposition: A | Discharge status: Home / Self Care | Payer: Medicaid Other | Source: Ambulatory Visit | Attending: Family Medicine | Admitting: Family Medicine

## 2012-01-14 VITALS — BP 120/67 | HR 86 | Wt 221.2 lb

## 2012-01-14 DIAGNOSIS — Z363 Encounter for antenatal screening for malformations: Secondary | ICD-10-CM | POA: Insufficient documentation

## 2012-01-14 DIAGNOSIS — Z283 Underimmunization status: Secondary | ICD-10-CM

## 2012-01-14 DIAGNOSIS — O0990 Supervision of high risk pregnancy, unspecified, unspecified trimester: Secondary | ICD-10-CM

## 2012-01-14 DIAGNOSIS — Z3490 Encounter for supervision of normal pregnancy, unspecified, unspecified trimester: Secondary | ICD-10-CM

## 2012-01-14 DIAGNOSIS — B951 Streptococcus, group B, as the cause of diseases classified elsewhere: Secondary | ICD-10-CM

## 2012-01-14 DIAGNOSIS — Z1389 Encounter for screening for other disorder: Secondary | ICD-10-CM | POA: Insufficient documentation

## 2012-01-14 DIAGNOSIS — E669 Obesity, unspecified: Secondary | ICD-10-CM | POA: Insufficient documentation

## 2012-01-14 DIAGNOSIS — O358XX0 Maternal care for other (suspected) fetal abnormality and damage, not applicable or unspecified: Secondary | ICD-10-CM | POA: Insufficient documentation

## 2012-01-14 DIAGNOSIS — Z2839 Other underimmunization status: Secondary | ICD-10-CM

## 2012-01-14 DIAGNOSIS — O24919 Unspecified diabetes mellitus in pregnancy, unspecified trimester: Secondary | ICD-10-CM

## 2012-01-14 NOTE — Progress Notes (Signed)
Karina [name]  was seen today for an ultrasound appointment.  See full report in [facility].  Comments: Ms. Joseph Art is seen today due to poorly controlled Type 2 diabetes.  Her baseline HbA1C value was 13.3 from early November which gives her a > 20% risk of diabetes associated congenital anomalies.  The fetal anatomic survey today was within normal limits; however, the heart was poorly visualized due to fetal position and maternal body habitus.  Impression: Single IUP at 18 6/7 weeks Normal fetal anatomic survey; however, limited views of the fetal heart were obtained No markers associated with aneuploidy were noted Normal amniotic fluid volume  Recommendations: Recommend follow-up ultrasound examination in 4 weeks to reevalaute fetal anatomy. Fetal Echo with Peds cardiology (scheduled)  Alpha Gula, MD

## 2012-01-16 ENCOUNTER — Encounter: Payer: Self-pay | Admitting: Family Medicine

## 2012-01-17 ENCOUNTER — Ambulatory Visit (INDEPENDENT_AMBULATORY_CARE_PROVIDER_SITE_OTHER): Payer: Medicaid Other | Admitting: Obstetrics & Gynecology

## 2012-01-17 VITALS — BP 125/74 | Temp 98.2°F | Wt 216.9 lb

## 2012-01-17 DIAGNOSIS — N390 Urinary tract infection, site not specified: Secondary | ICD-10-CM

## 2012-01-17 DIAGNOSIS — B951 Streptococcus, group B, as the cause of diseases classified elsewhere: Secondary | ICD-10-CM

## 2012-01-17 DIAGNOSIS — O2390 Unspecified genitourinary tract infection in pregnancy, unspecified trimester: Secondary | ICD-10-CM

## 2012-01-17 DIAGNOSIS — O24919 Unspecified diabetes mellitus in pregnancy, unspecified trimester: Secondary | ICD-10-CM

## 2012-01-17 LAB — POCT URINALYSIS DIP (DEVICE)
Protein, ur: NEGATIVE mg/dL
Specific Gravity, Urine: >=1.03 (ref 1.005–1.030)
Urobilinogen, UA: 0.2 mg/dL (ref 0.0–1.0)

## 2012-01-17 NOTE — Progress Notes (Signed)
Opthamology appointment scheduled 02/01/12 at 9 am with [facility].

## 2012-01-17 NOTE — Progress Notes (Signed)
Fasting CBG 100-130s; 2 hr PP B 123-149, 121 (not many values recorded 2/2 work); L 113-201; D 106-160.  Regimen changes to Novolog 20 units with meals and NPH 20 units bid.  Will reevaluate in one week and change regimen as needed.  Anatomy scan was inadequate, rescheduled in 4 weeks. Also has fetal echocardiogram.  Recommended Ophthalmology evaluation. Letter given for Dentist appointment. Quad screen done today. OB precautions given.

## 2012-01-17 NOTE — Patient Instructions (Signed)
Return to clinic for any obstetric concerns or go to [facility] for evaluation  

## 2012-01-17 NOTE — Progress Notes (Signed)
Pulse-103  Edema- feet  Pain/pressure- lower abd

## 2012-01-18 ENCOUNTER — Encounter: Payer: Self-pay | Admitting: *Deleted

## 2012-01-23 ENCOUNTER — Encounter: Payer: Self-pay | Admitting: Family Medicine

## 2012-01-24 ENCOUNTER — Ambulatory Visit (INDEPENDENT_AMBULATORY_CARE_PROVIDER_SITE_OTHER): Payer: Medicaid Other | Admitting: Family Medicine

## 2012-01-24 ENCOUNTER — Encounter: Payer: Self-pay | Admitting: *Deleted

## 2012-01-24 VITALS — BP 140/90 | Temp 97.8°F | Wt 218.8 lb

## 2012-01-24 DIAGNOSIS — O24919 Unspecified diabetes mellitus in pregnancy, unspecified trimester: Secondary | ICD-10-CM

## 2012-01-24 LAB — POCT URINALYSIS DIP (DEVICE)
Glucose, UA: NEGATIVE mg/dL
Hgb urine dipstick: NEGATIVE
Nitrite: NEGATIVE
Urobilinogen, UA: 0.2 mg/dL (ref 0.0–1.0)
pH: 7.5 (ref 5.0–8.0)

## 2012-01-24 NOTE — Patient Instructions (Addendum)
Increase lunch and dinner time Novolog to 22.  Gestational Diabetes Mellitus Gestational diabetes mellitus (GDM) is diabetes that occurs only during pregnancy. This happens when the body cannot properly handle the glucose (sugar) that increases in the blood after eating. During pregnancy, insulin resistance (reduced sensitivity to insulin) occurs because of the release of hormones from the placenta. Usually, the pancreas of pregnant women produces enough insulin to overcome the resistance that occurs. However, in gestational diabetes, the insulin is there but it does not work effectively. If the resistance is severe enough that the pancreas does not produce enough insulin, extra glucose builds up in the blood.  WHO IS AT RISK FOR DEVELOPING GESTATIONAL DIABETES?  Women with a history of diabetes in the family.  Women over age 69.  Women who are overweight.  Women in certain ethnic groups (Hispanic, African American, Native American, [place] and [place] Islander). WHAT CAN HAPPEN TO THE BABY? If the mother's blood glucose is too high while she is pregnant, the extra sugar will travel through the umbilical cord to the baby. Some of the problems the baby may have are:  Large Baby - If the baby receives too much sugar, the baby will gain more weight. This may cause the baby to be too large to be born normally (vaginally) and a Cesarean section (C-section) may be needed.  Low Blood Glucose (hypoglycemia)  The baby makes extra insulin, in response to the extra sugar its gets from its mother. When the baby is born and no longer needs this extra insulin, the baby's blood glucose level may drop.  Jaundice (yellow coloring of the skin and eyes)  This is fairly common in babies. It is caused from a build-up of the chemical called bilirubin. This is rarely serious, but is seen more often in babies whose mothers had gestational diabetes. RISKS TO THE MOTHER Women who have had gestational diabetes may be at  higher risk for some problems, including:  Preeclampsia or toxemia, which includes problems with high blood pressure. Blood pressure and protein levels in the urine must be checked frequently.  Infections.  Cesarean section (C-section) for delivery.  Developing Type 2 diabetes later in life. About 30-50% will develop diabetes later, especially if obese. DIAGNOSIS  The hormones that cause insulin resistance are highest at about 24-28 weeks of pregnancy. If symptoms are experienced, they are much like symptoms you would normally expect during pregnancy.  GDM is often diagnosed using a two part method: 1. After 24-28 weeks of pregnancy, the woman drinks a glucose solution and takes a blood test. If the glucose level is high, a second test will be given. 2. Oral Glucose Tolerance Test (OGTT) which is 3 hours long  After not eating overnight, the blood glucose is checked. The woman drinks a glucose solution, and hourly blood glucose tests are taken. If the woman has risk factors for GDM, the caregiver may test earlier than 24 weeks of pregnancy. TREATMENT  Treatment of GDM is directed at keeping the mother's blood glucose level normal, and may include:  Meal planning.  Taking insulin or other medicine to control your blood glucose level.  Exercise.  Keeping a daily record of the foods you eat.  Blood glucose monitoring and keeping a record of your blood glucose levels.  May monitor ketone levels in the urine, although this is no longer considered necessary in most pregnancies. HOME CARE INSTRUCTIONS  While you are pregnant:  Follow your caregiver's advice regarding your prenatal appointments, meal planning,  exercise, medicines, vitamins, blood and other tests, and physical activities.  Keep a record of your meals, blood glucose tests, and the amount of insulin you are taking (if any). Show this to your caregiver at every prenatal visit.  If you have GDM, you may have problems with  hypoglycemia (low blood glucose). You may suspect this if you become suddenly dizzy, feel shaky, and/or weak. If you think this is happening and you have a glucose meter, try to test your blood glucose level. Follow your caregiver's advice for when and how to treat your low blood glucose. Generally, the 15:15 rule is followed: Treat by consuming 15 grams of carbohydrates, wait 15 minutes, and recheck blood glucose. Examples of 15 grams of carbohydrates are:  1 cup skim or low-fat milk.   cup juice.  3-4 glucose tablets.  5-6 hard candies.  1 small box raisins.   cup regular soda pop.  Practice good hygiene, to avoid infections.  Do not smoke. SEEK MEDICAL CARE IF:   You develop abnormal vaginal discharge, with or without itching.  You become weak and tired more than expected.  You seem to sweat a lot.  You have a sudden increase in weight, 5 pounds or more in one week.  You are losing weight, 3 pounds or more in a week.  Your blood glucose level is high, and you need instructions on what to do about it. SEEK IMMEDIATE MEDICAL CARE IF:   You develop a severe headache.  You faint or pass out.  You develop nausea and vomiting.  You become disoriented or confused.  You have a convulsion.  You develop vision problems.  You develop stomach pain.  You develop vaginal bleeding.  You develop uterine contractions.  You have leaking or a gush of fluid from the vagina. AFTER YOU HAVE THE BABY:  Go to all of your follow-up appointments, and have blood tests as advised by your caregiver.  Maintain a healthy lifestyle, to prevent diabetes in the future. This includes:  Following a healthy meal plan.  Controlling your weight.  Getting enough exercise and proper rest.  Do not smoke.  Breastfeed your baby if you can. This will lower the chance of you and your baby developing diabetes later in life. For more information about diabetes, go to the American Diabetes  Association at: PMFashions.com.cy. For more information about gestational diabetes, go to the [employer] at: RentRule.com.au. Document Released: 04/05/2000 Document Revised: 03/22/2011 Document Reviewed: 10/28/2008 [facility] Patient Information 2013 [place], [place].  Pregnancy - Second Trimester The second trimester of pregnancy (3 to 6 months) is a period of rapid growth for you and your baby. At the end of the sixth month, your baby is about 9 inches long and weighs 1 1/2 pounds. You will begin to feel the baby move between 18 and 20 weeks of the pregnancy. This is called quickening. Weight gain is faster. A clear fluid (colostrum) may leak out of your breasts. You may feel small contractions of the womb (uterus). This is known as false labor or Braxton-Hicks contractions. This is like a practice for labor when the baby is ready to be born. Usually, the problems with morning sickness have usually passed by the end of your first trimester. Some women develop small dark blotches (called cholasma, mask of pregnancy) on their face that usually goes away after the baby is born. Exposure to the sun makes the blotches worse. Acne may also develop in some pregnant women  and pregnant women who have acne, may find that it goes away. PRENATAL EXAMS  Blood work may continue to be done during prenatal exams. These tests are done to check on your health and the probable health of your baby. Blood work is used to follow your blood levels (hemoglobin). Anemia (low hemoglobin) is common during pregnancy. Iron and vitamins are given to help prevent this. You will also be checked for diabetes between 24 and 28 weeks of the pregnancy. Some of the previous blood tests may be repeated.  The size of the uterus is measured during each visit. This is to make sure that the baby is continuing to grow properly according to the dates of the pregnancy.  Your blood pressure is  checked every prenatal visit. This is to make sure you are not getting toxemia.  Your urine is checked to make sure you do not have an infection, diabetes or protein in the urine.  Your weight is checked often to make sure gains are happening at the suggested rate. This is to ensure that both you and your baby are growing normally.  Sometimes, an ultrasound is performed to confirm the proper growth and development of the baby. This is a test which bounces harmless sound waves off the baby so your caregiver can more accurately determine due dates. Sometimes, a specialized test is done on the amniotic fluid surrounding the baby. This test is called an amniocentesis. The amniotic fluid is obtained by sticking a needle into the belly (abdomen). This is done to check the chromosomes in instances where there is a concern about possible genetic problems with the baby. It is also sometimes done near the end of pregnancy if an early delivery is required. In this case, it is done to help make sure the baby's lungs are mature enough for the baby to live outside of the womb. CHANGES OCCURING IN THE SECOND TRIMESTER OF PREGNANCY Your body goes through many changes during pregnancy. They vary from person to person. Talk to your caregiver about changes you notice that you are concerned about.  During the second trimester, you will likely have an increase in your appetite. It is normal to have cravings for certain foods. This varies from person to person and pregnancy to pregnancy.  Your lower abdomen will begin to bulge.  You may have to urinate more often because the uterus and baby are pressing on your bladder. It is also common to get more bladder infections during pregnancy (pain with urination). You can help this by drinking lots of fluids and emptying your bladder before and after intercourse.  You may begin to get stretch marks on your hips, abdomen, and breasts. These are normal changes in the body during  pregnancy. There are no exercises or medications to take that prevent this change.  You may begin to develop swollen and bulging veins (varicose veins) in your legs. Wearing support hose, elevating your feet for 15 minutes, 3 to 4 times a day and limiting salt in your diet helps lessen the problem.  Heartburn may develop as the uterus grows and pushes up against the stomach. Antacids recommended by your caregiver helps with this problem. Also, eating smaller meals 4 to 5 times a day helps.  Constipation can be treated with a stool softener or adding bulk to your diet. Drinking lots of fluids, vegetables, fruits, and whole grains are helpful.  Exercising is also helpful. If you have been very active up until your pregnancy, most of  these activities can be continued during your pregnancy. If you have been less active, it is helpful to start an exercise program such as walking.  Hemorrhoids (varicose veins in the rectum) may develop at the end of the second trimester. Warm sitz baths and hemorrhoid cream recommended by your caregiver helps hemorrhoid problems.  Backaches may develop during this time of your pregnancy. Avoid heavy lifting, wear low heal shoes and practice good posture to help with backache problems.  Some pregnant women develop tingling and numbness of their hand and fingers because of swelling and tightening of ligaments in the wrist (carpel tunnel syndrome). This goes away after the baby is born.  As your breasts enlarge, you may have to get a bigger bra. Get a comfortable, cotton, support bra. Do not get a nursing bra until the last month of the pregnancy if you will be nursing the baby.  You may get a dark line from your belly button to the pubic area called the linea nigra.  You may develop rosy cheeks because of increase blood flow to the face.  You may develop spider looking lines of the face, neck, arms and chest. These go away after the baby is born. HOME CARE  INSTRUCTIONS   It is extremely important to avoid all smoking, herbs, alcohol, and unprescribed drugs during your pregnancy. These chemicals affect the formation and growth of the baby. Avoid these chemicals throughout the pregnancy to ensure the delivery of a healthy infant.  Most of your home care instructions are the same as suggested for the first trimester of your pregnancy. Keep your caregiver's appointments. Follow your caregiver's instructions regarding medication use, exercise and diet.  During pregnancy, you are providing food for you and your baby. Continue to eat regular, well-balanced meals. Choose foods such as meat, fish, milk and other low fat dairy products, vegetables, fruits, and whole-grain breads and cereals. Your caregiver will tell you of the ideal weight gain.  A physical sexual relationship may be continued up until near the end of pregnancy if there are no other problems. Problems could include early (premature) leaking of amniotic fluid from the membranes, vaginal bleeding, abdominal pain, or other medical or pregnancy problems.  Exercise regularly if there are no restrictions. Check with your caregiver if you are unsure of the safety of some of your exercises. The greatest weight gain will occur in the last 2 trimesters of pregnancy. Exercise will help you:  Control your weight.  Get you in shape for labor and delivery.  Lose weight after you have the baby.  Wear a good support or jogging bra for breast tenderness during pregnancy. This may help if worn during sleep. Pads or tissues may be used in the bra if you are leaking colostrum.  Do not use hot tubs, steam rooms or saunas throughout the pregnancy.  Wear your seat belt at all times when driving. This protects you and your baby if you are in an accident.  Avoid raw meat, uncooked cheese, cat litter boxes and soil used by cats. These carry germs that can cause birth defects in the baby.  The second trimester  is also a good time to visit your dentist for your dental health if this has not been done yet. Getting your teeth cleaned is OK. Use a soft toothbrush. Brush gently during pregnancy.  It is easier to loose urine during pregnancy. Tightening up and strengthening the pelvic muscles will help with this problem. Practice stopping your urination while you  are going to the bathroom. These are the same muscles you need to strengthen. It is also the muscles you would use as if you were trying to stop from passing gas. You can practice tightening these muscles up 10 times a set and repeating this about 3 times per day. Once you know what muscles to tighten up, do not perform these exercises during urination. It is more likely to contribute to an infection by backing up the urine.  Ask for help if you have financial, counseling or nutritional needs during pregnancy. Your caregiver will be able to offer counseling for these needs as well as refer you for other special needs.  Your skin may become oily. If so, wash your face with mild soap, use non-greasy moisturizer and oil or cream based makeup. MEDICATIONS AND DRUG USE IN PREGNANCY  Take prenatal vitamins as directed. The vitamin should contain 1 milligram of folic acid. Keep all vitamins out of reach of children. Only a couple vitamins or tablets containing iron may be fatal to a baby or young child when ingested.  Avoid use of all medications, including herbs, over-the-counter medications, not prescribed or suggested by your caregiver. Only take over-the-counter or prescription medicines for pain, discomfort, or fever as directed by your caregiver. Do not use aspirin.  Let your caregiver also know about herbs you may be using.  Alcohol is related to a number of birth defects. This includes fetal alcohol syndrome. All alcohol, in any form, should be avoided completely. Smoking will cause low birth rate and premature babies.  Street or illegal drugs are  very harmful to the baby. They are absolutely forbidden. A baby born to an addicted mother will be addicted at birth. The baby will go through the same withdrawal an adult does. SEEK MEDICAL CARE IF:  You have any concerns or worries during your pregnancy. It is better to call with your questions if you feel they cannot wait, rather than worry about them. SEEK IMMEDIATE MEDICAL CARE IF:   An unexplained oral temperature above 102 F (38.9 C) develops, or as your caregiver suggests.  You have leaking of fluid from the vagina (birth canal). If leaking membranes are suspected, take your temperature and tell your caregiver of this when you call.  There is vaginal spotting, bleeding, or passing clots. Tell your caregiver of the amount and how many pads are used. Light spotting in pregnancy is common, especially following intercourse.  You develop a bad smelling vaginal discharge with a change in the color from clear to white.  You continue to feel sick to your stomach (nauseated) and have no relief from remedies suggested. You vomit blood or coffee ground-like materials.  You lose more than 2 pounds of weight or gain more than 2 pounds of weight over 1 week, or as suggested by your caregiver.  You notice swelling of your face, hands, feet, or legs.  You get exposed to [place] measles and have never had them.  You are exposed to fifth disease or chickenpox.  You develop belly (abdominal) pain. Round ligament discomfort is a common non-cancerous (benign) cause of abdominal pain in pregnancy. Your caregiver still must evaluate you.  You develop a bad headache that does not go away.  You develop fever, diarrhea, pain with urination, or shortness of breath.  You develop visual problems, blurry, or double vision.  You fall or are in a car accident or any kind of trauma.  There is mental or physical violence at home.  Document Released: 12/22/2000 Document Revised: 03/22/2011 Document Reviewed:  06/26/2008 [facility] Patient Information 2013 [place], [place].

## 2012-01-24 NOTE — Progress Notes (Signed)
No test strips for 3 days so only had 4 days of CBG. Fasting 68, 75, 97, 97  PP:  99-155 4/10 out of range. Taking NPH 15, 20;  Novolog:  20 tid, occasionally 5-6 units if 2 hour PP is elevated. Will increase lunch and dinner insulin Novolog to 22 as these are the highest numbers. Has MFM ultrasound and fetal echo 1/29. Has ophtho appt but not sure of date. Quad screen negative. BP a little hight today; no proteinuria. Monitor.

## 2012-01-24 NOTE — Progress Notes (Signed)
P-[id number] 

## 2012-01-25 ENCOUNTER — Telehealth: Payer: Self-pay | Admitting: *Deleted

## 2012-01-25 ENCOUNTER — Encounter: Payer: Self-pay | Admitting: Obstetrics & Gynecology

## 2012-01-25 ENCOUNTER — Encounter: Payer: Self-pay | Admitting: *Deleted

## 2012-01-26 ENCOUNTER — Other Ambulatory Visit: Payer: Self-pay | Admitting: *Deleted

## 2012-01-26 NOTE — Telephone Encounter (Signed)
Called [facility] at [phone] and spoke with pharmacist Trinna Post and informed him of the new order for Novolog is continued 20 units @ breakfast and then 22 units @ lunch and dinner.  Pharmacist stated understanding.  Called pt and informed her that I had spoken with her pharmacy with increase in her dosage of her Novolog.  Pt stated "thanks" and did not have any further questions.

## 2012-01-26 NOTE — Telephone Encounter (Signed)
Pt left message requesting a nurse to call her pharmacy and update her dosage information on her Novolog flexpen so that she can get a refill. They are telling her it is too soon but her dosage has been increased. Per note from Dr. Thad Ranger on 01/24/12, the dosage was increased to 22units @ lunch and dinner, continue 20 units @ breakfast.

## 2012-01-31 ENCOUNTER — Ambulatory Visit (INDEPENDENT_AMBULATORY_CARE_PROVIDER_SITE_OTHER): Payer: Medicaid Other | Admitting: Obstetrics and Gynecology

## 2012-01-31 ENCOUNTER — Encounter: Payer: Medicaid Other | Attending: Obstetrics & Gynecology | Admitting: Dietician

## 2012-01-31 VITALS — BP 124/74 | Temp 98.5°F | Wt 220.4 lb

## 2012-01-31 DIAGNOSIS — O24919 Unspecified diabetes mellitus in pregnancy, unspecified trimester: Secondary | ICD-10-CM

## 2012-01-31 DIAGNOSIS — E119 Type 2 diabetes mellitus without complications: Secondary | ICD-10-CM | POA: Insufficient documentation

## 2012-01-31 DIAGNOSIS — Z713 Dietary counseling and surveillance: Secondary | ICD-10-CM | POA: Insufficient documentation

## 2012-01-31 DIAGNOSIS — O0990 Supervision of high risk pregnancy, unspecified, unspecified trimester: Secondary | ICD-10-CM

## 2012-01-31 DIAGNOSIS — B373 Candidiasis of vulva and vagina: Secondary | ICD-10-CM

## 2012-01-31 LAB — POCT URINALYSIS DIP (DEVICE)
Bilirubin Urine: NEGATIVE
Leukocytes, UA: NEGATIVE
Nitrite: NEGATIVE
Protein, ur: NEGATIVE mg/dL
Urobilinogen, UA: 1 mg/dL (ref 0.0–1.0)
pH: 7 (ref 5.0–8.0)

## 2012-01-31 MED ORDER — FLUCONAZOLE 150 MG PO TABS: 150.0000 mg | ORAL_TABLET | Freq: Once | ORAL | Status: DC

## 2012-01-31 NOTE — Progress Notes (Signed)
Diabetes Education:  Having normal fasting, but post meal readings at the evening meal are all high.  She is eating at varying times.  Recommended that she try to eat as close to the 6-7 PM as she can and on those evenings increase her Novolog  By 2 units just at the evening meal.  Needs to have a bedtime snack if her glucose levels are at the normal <120 after dinner.  Recommended that on class nights, she have a sandwich or 6 inch sub at the evening time.  Will need to take her insulin with her if she is using the sandwich as a big snack.  Will continue to work with Jenita Seashore RD, CDE, Certified Pump Trainer in trying to get her on a pump.  I know that [employer] has been notified and the option of trying to get a loaner pump in for use during her pregnancy, is a topic that is being worked on.  Maggie Eidan Muellner, RN, RD, CDE.

## 2012-01-31 NOTE — Progress Notes (Signed)
Pulse- 93 Patient reports pelvic pain & pressure; also reports white/green d/c with itching and mild odor

## 2012-01-31 NOTE — Patient Instructions (Signed)
Monilial Vaginitis Vaginitis in a soreness, swelling and redness (inflammation) of the vagina and vulva. Monilial vaginitis is not a sexually transmitted infection. CAUSES  Yeast vaginitis is caused by yeast (candida) that is normally found in your vagina. With a yeast infection, the candida has overgrown in number to a point that upsets the chemical balance. SYMPTOMS   White, thick vaginal discharge.  Swelling, itching, redness and irritation of the vagina and possibly the lips of the vagina (vulva).  Burning or painful urination.  Painful intercourse. DIAGNOSIS  Things that may contribute to monilial vaginitis are:  Postmenopausal and virginal states.  Pregnancy.  Infections.  Being tired, sick or stressed, especially if you had monilial vaginitis in the past.  Diabetes. Good control will help lower the chance.  Birth control pills.  Tight fitting garments.  Using bubble bath, feminine sprays, douches or deodorant tampons.  Taking certain medications that kill germs (antibiotics).  Sporadic recurrence can occur if you become ill. TREATMENT  Your caregiver will give you medication.  There are several kinds of anti monilial vaginal creams and suppositories specific for monilial vaginitis. For recurrent yeast infections, use a suppository or cream in the vagina 2 times a week, or as directed.  Anti-monilial or steroid cream for the itching or irritation of the vulva may also be used. Get your caregiver's permission.  Painting the vagina with methylene blue solution may help if the monilial cream does not work.  Eating yogurt may help prevent monilial vaginitis. HOME CARE INSTRUCTIONS   Finish all medication as prescribed.  Do not have sex until treatment is completed or after your caregiver tells you it is okay.  Take warm sitz baths.  Do not douche.  Do not use tampons, especially scented ones.  Wear cotton underwear.  Avoid tight pants and panty  hose.  Tell your sexual partner that you have a yeast infection. They should go to their caregiver if they have symptoms such as mild rash or itching.  Your sexual partner should be treated as well if your infection is difficult to eliminate.  Practice safer sex. Use condoms.  Some vaginal medications cause latex condoms to fail. Vaginal medications that harm condoms are:  Cleocin cream.  Butoconazole (Femstat).  Terconazole (Terazol) vaginal suppository.  Miconazole (Monistat) (may be purchased over the counter). SEEK MEDICAL CARE IF:   You have a temperature by mouth above 102 F (38.9 C).  The infection is getting worse after 2 days of treatment.  The infection is not getting better after 3 days of treatment.  You develop blisters in or around your vagina.  You develop vaginal bleeding, and it is not your menstrual period.  You have pain when you urinate.  You develop intestinal problems.  You have pain with sexual intercourse. Document Released: 10/07/2004 Document Revised: 03/22/2011 Document Reviewed: 06/21/2008 [employer] Patient Information 2013 [employer].  

## 2012-01-31 NOTE — Progress Notes (Signed)
Fastings all within target range pcb 140's x 2,pcl 128x2 and 130 once, pcd all high 128-184. Probably will increase dinner Novalog. See Maggie. Has increased Novalog 22 w/ meals several times if ac values high, otherwise dosage as noted. Trying to get pump.  Pruritic thick white discharge> Diflucan.

## 2012-02-01 ENCOUNTER — Telehealth: Payer: Self-pay | Admitting: *Deleted

## 2012-02-01 NOTE — Telephone Encounter (Addendum)
Pt left message of wanting to know if her Rx for diflucan had been sent in to [facility] on [address].  I returned pt call and left message that her Rx was sent electronically yesterday. If she encounters any problem from the pharmacy, please call us back and we will re-send the Rx.

## 2012-02-04 ENCOUNTER — Telehealth: Payer: Self-pay | Admitting: *Deleted

## 2012-02-07 ENCOUNTER — Telehealth: Payer: Self-pay | Admitting: *Deleted

## 2012-02-07 ENCOUNTER — Ambulatory Visit (INDEPENDENT_AMBULATORY_CARE_PROVIDER_SITE_OTHER): Payer: Medicaid Other | Admitting: Family Medicine

## 2012-02-07 VITALS — BP 118/74 | Temp 97.3°F | Wt 218.0 lb

## 2012-02-07 DIAGNOSIS — O24919 Unspecified diabetes mellitus in pregnancy, unspecified trimester: Secondary | ICD-10-CM

## 2012-02-07 LAB — POCT URINALYSIS DIP (DEVICE)
Leukocytes, UA: NEGATIVE
Nitrite: NEGATIVE
Protein, ur: NEGATIVE mg/dL
Urobilinogen, UA: 1 mg/dL (ref 0.0–1.0)

## 2012-02-07 MED ORDER — INSULIN ASPART 100 UNIT/ML ~~LOC~~ SOLN: 23.0000 [IU] | Freq: Three times a day (TID) | SUBCUTANEOUS | Status: DC

## 2012-02-07 NOTE — Telephone Encounter (Signed)
Called Karina [name] and informed her she can have a shielded xray of teeth if needed by dentist and routine care- informed her she may need a letter stating that. She states she already has the letter.

## 2012-02-07 NOTE — Progress Notes (Signed)
Did not bring Blood sugar log today or machine. Taking 22 novolog with meals TID. NPH 20 bid. Fastings 90-100 (highest 114 before took diflucan for yeast infection). PP 120-130. Highest 139. Increase novolog to 23 with meals. Will leave NPH same for now. Awaiting insulin pump. Ultrasound and echo scheduled for 1/29. Has ophtho appt scheduled but can't remember when. F/U in 1 week with log to monitor changes in insulin regimen.

## 2012-02-07 NOTE — Patient Instructions (Addendum)
Pregnancy - Second Trimester The second trimester of pregnancy (3 to 6 months) is a period of rapid growth for you and your baby. At the end of the sixth month, your baby is about 9 inches long and weighs 1 1/2 pounds. You will begin to feel the baby move between 18 and 20 weeks of the pregnancy. This is called quickening. Weight gain is faster. A clear fluid (colostrum) may leak out of your breasts. You may feel small contractions of the womb (uterus). This is known as false labor or Braxton-Hicks contractions. This is like a practice for labor when the baby is ready to be born. Usually, the problems with morning sickness have usually passed by the end of your first trimester. Some women develop small dark blotches (called cholasma, mask of pregnancy) on their face that usually goes away after the baby is born. Exposure to the sun makes the blotches worse. Acne may also develop in some pregnant women and pregnant women who have acne, may find that it goes away. PRENATAL EXAMS  Blood work may continue to be done during prenatal exams. These tests are done to check on your health and the probable health of your baby. Blood work is used to follow your blood levels (hemoglobin). Anemia (low hemoglobin) is common during pregnancy. Iron and vitamins are given to help prevent this. You will also be checked for diabetes between 24 and 28 weeks of the pregnancy. Some of the previous blood tests may be repeated.  The size of the uterus is measured during each visit. This is to make sure that the baby is continuing to grow properly according to the dates of the pregnancy.  Your blood pressure is checked every prenatal visit. This is to make sure you are not getting toxemia.  Your urine is checked to make sure you do not have an infection, diabetes or protein in the urine.  Your weight is checked often to make sure gains are happening at the suggested rate. This is to ensure that both you and your baby are growing  normally.  Sometimes, an ultrasound is performed to confirm the proper growth and development of the baby. This is a test which bounces harmless sound waves off the baby so your caregiver can more accurately determine due dates. Sometimes, a specialized test is done on the amniotic fluid surrounding the baby. This test is called an amniocentesis. The amniotic fluid is obtained by sticking a needle into the belly (abdomen). This is done to check the chromosomes in instances where there is a concern about possible genetic problems with the baby. It is also sometimes done near the end of pregnancy if an early delivery is required. In this case, it is done to help make sure the baby's lungs are mature enough for the baby to live outside of the womb. CHANGES OCCURING IN THE SECOND TRIMESTER OF PREGNANCY Your body goes through many changes during pregnancy. They vary from person to person. Talk to your caregiver about changes you notice that you are concerned about.  During the second trimester, you will likely have an increase in your appetite. It is normal to have cravings for certain foods. This varies from person to person and pregnancy to pregnancy.  Your lower abdomen will begin to bulge.  You may have to urinate more often because the uterus and baby are pressing on your bladder. It is also common to get more bladder infections during pregnancy (pain with urination). You can help this by   drinking lots of fluids and emptying your bladder before and after intercourse.  You may begin to get stretch marks on your hips, abdomen, and breasts. These are normal changes in the body during pregnancy. There are no exercises or medications to take that prevent this change.  You may begin to develop swollen and bulging veins (varicose veins) in your legs. Wearing support hose, elevating your feet for 15 minutes, 3 to 4 times a day and limiting salt in your diet helps lessen the problem.  Heartburn may develop  as the uterus grows and pushes up against the stomach. Antacids recommended by your caregiver helps with this problem. Also, eating smaller meals 4 to 5 times a day helps.  Constipation can be treated with a stool softener or adding bulk to your diet. Drinking lots of fluids, vegetables, fruits, and whole grains are helpful.  Exercising is also helpful. If you have been very active up until your pregnancy, most of these activities can be continued during your pregnancy. If you have been less active, it is helpful to start an exercise program such as walking.  Hemorrhoids (varicose veins in the rectum) may develop at the end of the second trimester. Warm sitz baths and hemorrhoid cream recommended by your caregiver helps hemorrhoid problems.  Backaches may develop during this time of your pregnancy. Avoid heavy lifting, wear low heal shoes and practice good posture to help with backache problems.  Some pregnant women develop tingling and numbness of their hand and fingers because of swelling and tightening of ligaments in the wrist (carpel tunnel syndrome). This goes away after the baby is born.  As your breasts enlarge, you may have to get a bigger bra. Get a comfortable, cotton, support bra. Do not get a nursing bra until the last month of the pregnancy if you will be nursing the baby.  You may get a dark line from your belly button to the pubic area called the linea nigra.  You may develop rosy cheeks because of increase blood flow to the face.  You may develop spider looking lines of the face, neck, arms and chest. These go away after the baby is born. HOME CARE INSTRUCTIONS   It is extremely important to avoid all smoking, herbs, alcohol, and unprescribed drugs during your pregnancy. These chemicals affect the formation and growth of the baby. Avoid these chemicals throughout the pregnancy to ensure the delivery of a healthy infant.  Most of your home care instructions are the same as  suggested for the first trimester of your pregnancy. Keep your caregiver's appointments. Follow your caregiver's instructions regarding medication use, exercise and diet.  During pregnancy, you are providing food for you and your baby. Continue to eat regular, well-balanced meals. Choose foods such as meat, fish, milk and other low fat dairy products, vegetables, fruits, and whole-grain breads and cereals. Your caregiver will tell you of the ideal weight gain.  A physical sexual relationship may be continued up until near the end of pregnancy if there are no other problems. Problems could include early (premature) leaking of amniotic fluid from the membranes, vaginal bleeding, abdominal pain, or other medical or pregnancy problems.  Exercise regularly if there are no restrictions. Check with your caregiver if you are unsure of the safety of some of your exercises. The greatest weight gain will occur in the last 2 trimesters of pregnancy. Exercise will help you:  Control your weight.  Get you in shape for labor and delivery.  Lose weight   after you have the baby.  Wear a good support or jogging bra for breast tenderness during pregnancy. This may help if worn during sleep. Pads or tissues may be used in the bra if you are leaking colostrum.  Do not use hot tubs, steam rooms or saunas throughout the pregnancy.  Wear your seat belt at all times when driving. This protects you and your baby if you are in an accident.  Avoid raw meat, uncooked cheese, cat litter boxes and soil used by cats. These carry germs that can cause birth defects in the baby.  The second trimester is also a good time to visit your dentist for your dental health if this has not been done yet. Getting your teeth cleaned is OK. Use a soft toothbrush. Brush gently during pregnancy.  It is easier to loose urine during pregnancy. Tightening up and strengthening the pelvic muscles will help with this problem. Practice stopping your  urination while you are going to the bathroom. These are the same muscles you need to strengthen. It is also the muscles you would use as if you were trying to stop from passing gas. You can practice tightening these muscles up 10 times a set and repeating this about 3 times per day. Once you know what muscles to tighten up, do not perform these exercises during urination. It is more likely to contribute to an infection by backing up the urine.  Ask for help if you have financial, counseling or nutritional needs during pregnancy. Your caregiver will be able to offer counseling for these needs as well as refer you for other special needs.  Your skin may become oily. If so, wash your face with mild soap, use non-greasy moisturizer and oil or cream based makeup. MEDICATIONS AND DRUG USE IN PREGNANCY  Take prenatal vitamins as directed. The vitamin should contain 1 milligram of folic acid. Keep all vitamins out of reach of children. Only a couple vitamins or tablets containing iron may be fatal to a baby or young child when ingested.  Avoid use of all medications, including herbs, over-the-counter medications, not prescribed or suggested by your caregiver. Only take over-the-counter or prescription medicines for pain, discomfort, or fever as directed by your caregiver. Do not use aspirin.  Let your caregiver also know about herbs you may be using.  Alcohol is related to a number of birth defects. This includes fetal alcohol syndrome. All alcohol, in any form, should be avoided completely. Smoking will cause low birth rate and premature babies.  Street or illegal drugs are very harmful to the baby. They are absolutely forbidden. A baby born to an addicted mother will be addicted at birth. The baby will go through the same withdrawal an adult does. SEEK MEDICAL CARE IF:  You have any concerns or worries during your pregnancy. It is better to call with your questions if you feel they cannot wait, rather  than worry about them. SEEK IMMEDIATE MEDICAL CARE IF:   An unexplained oral temperature above 102 F (38.9 C) develops, or as your caregiver suggests.  You have leaking of fluid from the vagina (birth canal). If leaking membranes are suspected, take your temperature and tell your caregiver of this when you call.  There is vaginal spotting, bleeding, or passing clots. Tell your caregiver of the amount and how many pads are used. Light spotting in pregnancy is common, especially following intercourse.  You develop a bad smelling vaginal discharge with a change in the color from clear   to white.  You continue to feel sick to your stomach (nauseated) and have no relief from remedies suggested. You vomit blood or coffee ground-like materials.  You lose more than 2 pounds of weight or gain more than 2 pounds of weight over 1 week, or as suggested by your caregiver.  You notice swelling of your face, hands, feet, or legs.  You get exposed to German measles and have never had them.  You are exposed to fifth disease or chickenpox.  You develop belly (abdominal) pain. Round ligament discomfort is a common non-cancerous (benign) cause of abdominal pain in pregnancy. Your caregiver still must evaluate you.  You develop a bad headache that does not go away.  You develop fever, diarrhea, pain with urination, or shortness of breath.  You develop visual problems, blurry, or double vision.  You fall or are in a car accident or any kind of trauma.  There is mental or physical violence at home. Document Released: 12/22/2000 Document Revised: 03/22/2011 Document Reviewed: 06/26/2008 Exit[employer] Patient Information 2013 [employer].  

## 2012-02-07 NOTE — Telephone Encounter (Signed)
Karina [name] called and left a message stating she is 22 weeks pregant and has a dental appointment tomorrow and wants to know if it ok for her to get a shielded xray. States she doesn't think she is comfortable with it but knows they will need to do it to treat her.

## 2012-02-07 NOTE — Progress Notes (Signed)
Pulse 91  Edema trace in ankles. No c/o pain; pressure in pelvic.

## 2012-02-09 ENCOUNTER — Ambulatory Visit (HOSPITAL_COMMUNITY)
Admission: RE | Admit: 2012-02-09 | Discharge: 2012-02-09 | Disposition: A | Discharge status: Home / Self Care | Payer: Medicaid Other | Source: Ambulatory Visit | Attending: Obstetrics and Gynecology | Admitting: Obstetrics and Gynecology

## 2012-02-09 VITALS — BP 110/61 | HR 93 | Wt 226.0 lb

## 2012-02-09 DIAGNOSIS — O2340 Unspecified infection of urinary tract in pregnancy, unspecified trimester: Secondary | ICD-10-CM

## 2012-02-09 DIAGNOSIS — O24919 Unspecified diabetes mellitus in pregnancy, unspecified trimester: Secondary | ICD-10-CM

## 2012-02-09 DIAGNOSIS — O0990 Supervision of high risk pregnancy, unspecified, unspecified trimester: Secondary | ICD-10-CM

## 2012-02-09 DIAGNOSIS — Z3689 Encounter for other specified antenatal screening: Secondary | ICD-10-CM | POA: Insufficient documentation

## 2012-02-09 DIAGNOSIS — Z2839 Other underimmunization status: Secondary | ICD-10-CM

## 2012-02-09 DIAGNOSIS — Z283 Underimmunization status: Secondary | ICD-10-CM

## 2012-02-09 DIAGNOSIS — B951 Streptococcus, group B, as the cause of diseases classified elsewhere: Secondary | ICD-10-CM

## 2012-02-09 NOTE — Progress Notes (Signed)
Karina [name]  was seen today for an ultrasound appointment.  See full report in [facility].  Comments: [name] returns for follow up due to poorly controlled Type 2 diabetes.  Her baseline HbA1C value was 13.3 from early November.  She is scheduled for fetal echo later this afternoon.  Impression: Single IUP at 22 4/7 weeks Normal interval anatomy Fetal growth is appropriate (47th %tile) Normal amniotic fluid volume  Recommendations: Recommend follow-up ultrasound examination in 4 weeks for interval growth.  Alpha Gula, MD

## 2012-02-11 ENCOUNTER — Encounter: Payer: Self-pay | Admitting: Obstetrics and Gynecology

## 2012-02-11 DIAGNOSIS — O24319 Unspecified pre-existing diabetes mellitus in pregnancy, unspecified trimester: Secondary | ICD-10-CM | POA: Insufficient documentation

## 2012-02-14 ENCOUNTER — Ambulatory Visit (INDEPENDENT_AMBULATORY_CARE_PROVIDER_SITE_OTHER): Payer: Medicaid Other | Admitting: Obstetrics and Gynecology

## 2012-02-14 ENCOUNTER — Encounter: Payer: Medicaid Other | Admitting: Obstetrics and Gynecology

## 2012-02-14 VITALS — BP 132/72 | Temp 97.5°F | Wt 223.8 lb

## 2012-02-14 DIAGNOSIS — E119 Type 2 diabetes mellitus without complications: Secondary | ICD-10-CM

## 2012-02-14 DIAGNOSIS — O24919 Unspecified diabetes mellitus in pregnancy, unspecified trimester: Secondary | ICD-10-CM

## 2012-02-14 NOTE — Progress Notes (Signed)
Fastings 80-94, 99x1; pcb: 114-130; pcl: 1118-140 (4 elevated); pcd: 123-137 (5 elevated)> probably increase am NPH, see Maggie [facility] [place] all normal. Had echo, result pending.  Vaginal discharge looks like yeast to her but no itch or irritation.  No vaginal bleed.> WP next if persists.

## 2012-02-14 NOTE — Progress Notes (Signed)
Patient reports a dark gray/brown discharge.  No itching, burning, pain or irritation.

## 2012-02-14 NOTE — Progress Notes (Signed)
Diabetes Education.  Pt had left the clinic by the time I got to see her.  Numbers are running high for fasting at times.  Post meals are slightly elevated at breakfast and higher at some lunch meals.  Sinner is consistently in the 120-131 range.  At this point, it would be nice to see her carb counts for the meals.  She Karina [name] need a touch of insulin for some meals and not for others.  Dosing by carb counts would make her results most likely closer to the norms. Will inquire today regarding the status of her insulin pump.  Maggie Florence Antonelli, RN, RD, CDE.

## 2012-02-14 NOTE — Patient Instructions (Signed)
Pregnancy - Second Trimester The second trimester of pregnancy (3 to 6 months) is a period of rapid growth for you and your baby. At the end of the sixth month, your baby is about 9 inches long and weighs 1 1/2 pounds. You will begin to feel the baby move between 18 and 20 weeks of the pregnancy. This is called quickening. Weight gain is faster. A clear fluid (colostrum) may leak out of your breasts. You may feel small contractions of the womb (uterus). This is known as false labor or Braxton-Hicks contractions. This is like a practice for labor when the baby is ready to be born. Usually, the problems with morning sickness have usually passed by the end of your first trimester. Some women develop small dark blotches (called cholasma, mask of pregnancy) on their face that usually goes away after the baby is born. Exposure to the sun makes the blotches worse. Acne may also develop in some pregnant women and pregnant women who have acne, may find that it goes away. PRENATAL EXAMS  Blood work may continue to be done during prenatal exams. These tests are done to check on your health and the probable health of your baby. Blood work is used to follow your blood levels (hemoglobin). Anemia (low hemoglobin) is common during pregnancy. Iron and vitamins are given to help prevent this. You will also be checked for diabetes between 24 and 28 weeks of the pregnancy. Some of the previous blood tests may be repeated.  The size of the uterus is measured during each visit. This is to make sure that the baby is continuing to grow properly according to the dates of the pregnancy.  Your blood pressure is checked every prenatal visit. This is to make sure you are not getting toxemia.  Your urine is checked to make sure you do not have an infection, diabetes or protein in the urine.  Your weight is checked often to make sure gains are happening at the suggested rate. This is to ensure that both you and your baby are growing  normally.  Sometimes, an ultrasound is performed to confirm the proper growth and development of the baby. This is a test which bounces harmless sound waves off the baby so your caregiver can more accurately determine due dates. Sometimes, a specialized test is done on the amniotic fluid surrounding the baby. This test is called an amniocentesis. The amniotic fluid is obtained by sticking a needle into the belly (abdomen). This is done to check the chromosomes in instances where there is a concern about possible genetic problems with the baby. It is also sometimes done near the end of pregnancy if an early delivery is required. In this case, it is done to help make sure the baby's lungs are mature enough for the baby to live outside of the womb. CHANGES OCCURING IN THE SECOND TRIMESTER OF PREGNANCY Your body goes through many changes during pregnancy. They vary from person to person. Talk to your caregiver about changes you notice that you are concerned about.  During the second trimester, you will likely have an increase in your appetite. It is normal to have cravings for certain foods. This varies from person to person and pregnancy to pregnancy.  Your lower abdomen will begin to bulge.  You may have to urinate more often because the uterus and baby are pressing on your bladder. It is also common to get more bladder infections during pregnancy (pain with urination). You can help this by   drinking lots of fluids and emptying your bladder before and after intercourse.  You may begin to get stretch marks on your hips, abdomen, and breasts. These are normal changes in the body during pregnancy. There are no exercises or medications to take that prevent this change.  You may begin to develop swollen and bulging veins (varicose veins) in your legs. Wearing support hose, elevating your feet for 15 minutes, 3 to 4 times a day and limiting salt in your diet helps lessen the problem.  Heartburn may develop  as the uterus grows and pushes up against the stomach. Antacids recommended by your caregiver helps with this problem. Also, eating smaller meals 4 to 5 times a day helps.  Constipation can be treated with a stool softener or adding bulk to your diet. Drinking lots of fluids, vegetables, fruits, and whole grains are helpful.  Exercising is also helpful. If you have been very active up until your pregnancy, most of these activities can be continued during your pregnancy. If you have been less active, it is helpful to start an exercise program such as walking.  Hemorrhoids (varicose veins in the rectum) may develop at the end of the second trimester. Warm sitz baths and hemorrhoid cream recommended by your caregiver helps hemorrhoid problems.  Backaches may develop during this time of your pregnancy. Avoid heavy lifting, wear low heal shoes and practice good posture to help with backache problems.  Some pregnant women develop tingling and numbness of their hand and fingers because of swelling and tightening of ligaments in the wrist (carpel tunnel syndrome). This goes away after the baby is born.  As your breasts enlarge, you may have to get a bigger bra. Get a comfortable, cotton, support bra. Do not get a nursing bra until the last month of the pregnancy if you will be nursing the baby.  You may get a dark line from your belly button to the pubic area called the linea nigra.  You may develop rosy cheeks because of increase blood flow to the face.  You may develop spider looking lines of the face, neck, arms and chest. These go away after the baby is born. HOME CARE INSTRUCTIONS   It is extremely important to avoid all smoking, herbs, alcohol, and unprescribed drugs during your pregnancy. These chemicals affect the formation and growth of the baby. Avoid these chemicals throughout the pregnancy to ensure the delivery of a healthy infant.  Most of your home care instructions are the same as  suggested for the first trimester of your pregnancy. Keep your caregiver's appointments. Follow your caregiver's instructions regarding medication use, exercise and diet.  During pregnancy, you are providing food for you and your baby. Continue to eat regular, well-balanced meals. Choose foods such as meat, fish, milk and other low fat dairy products, vegetables, fruits, and whole-grain breads and cereals. Your caregiver will tell you of the ideal weight gain.  A physical sexual relationship may be continued up until near the end of pregnancy if there are no other problems. Problems could include early (premature) leaking of amniotic fluid from the membranes, vaginal bleeding, abdominal pain, or other medical or pregnancy problems.  Exercise regularly if there are no restrictions. Check with your caregiver if you are unsure of the safety of some of your exercises. The greatest weight gain will occur in the last 2 trimesters of pregnancy. Exercise will help you:  Control your weight.  Get you in shape for labor and delivery.  Lose weight   after you have the baby.  Wear a good support or jogging bra for breast tenderness during pregnancy. This may help if worn during sleep. Pads or tissues may be used in the bra if you are leaking colostrum.  Do not use hot tubs, steam rooms or saunas throughout the pregnancy.  Wear your seat belt at all times when driving. This protects you and your baby if you are in an accident.  Avoid raw meat, uncooked cheese, cat litter boxes and soil used by cats. These carry germs that can cause birth defects in the baby.  The second trimester is also a good time to visit your dentist for your dental health if this has not been done yet. Getting your teeth cleaned is OK. Use a soft toothbrush. Brush gently during pregnancy.  It is easier to loose urine during pregnancy. Tightening up and strengthening the pelvic muscles will help with this problem. Practice stopping your  urination while you are going to the bathroom. These are the same muscles you need to strengthen. It is also the muscles you would use as if you were trying to stop from passing gas. You can practice tightening these muscles up 10 times a set and repeating this about 3 times per day. Once you know what muscles to tighten up, do not perform these exercises during urination. It is more likely to contribute to an infection by backing up the urine.  Ask for help if you have financial, counseling or nutritional needs during pregnancy. Your caregiver will be able to offer counseling for these needs as well as refer you for other special needs.  Your skin may become oily. If so, wash your face with mild soap, use non-greasy moisturizer and oil or cream based makeup. MEDICATIONS AND DRUG USE IN PREGNANCY  Take prenatal vitamins as directed. The vitamin should contain 1 milligram of folic acid. Keep all vitamins out of reach of children. Only a couple vitamins or tablets containing iron may be fatal to a baby or young child when ingested.  Avoid use of all medications, including herbs, over-the-counter medications, not prescribed or suggested by your caregiver. Only take over-the-counter or prescription medicines for pain, discomfort, or fever as directed by your caregiver. Do not use aspirin.  Let your caregiver also know about herbs you may be using.  Alcohol is related to a number of birth defects. This includes fetal alcohol syndrome. All alcohol, in any form, should be avoided completely. Smoking will cause low birth rate and premature babies.  Street or illegal drugs are very harmful to the baby. They are absolutely forbidden. A baby born to an addicted mother will be addicted at birth. The baby will go through the same withdrawal an adult does. SEEK MEDICAL CARE IF:  You have any concerns or worries during your pregnancy. It is better to call with your questions if you feel they cannot wait, rather  than worry about them. SEEK IMMEDIATE MEDICAL CARE IF:   An unexplained oral temperature above 102 F (38.9 C) develops, or as your caregiver suggests.  You have leaking of fluid from the vagina (birth canal). If leaking membranes are suspected, take your temperature and tell your caregiver of this when you call.  There is vaginal spotting, bleeding, or passing clots. Tell your caregiver of the amount and how many pads are used. Light spotting in pregnancy is common, especially following intercourse.  You develop a bad smelling vaginal discharge with a change in the color from clear   to white.  You continue to feel sick to your stomach (nauseated) and have no relief from remedies suggested. You vomit blood or coffee ground-like materials.  You lose more than 2 pounds of weight or gain more than 2 pounds of weight over 1 week, or as suggested by your caregiver.  You notice swelling of your face, hands, feet, or legs.  You get exposed to German measles and have never had them.  You are exposed to fifth disease or chickenpox.  You develop belly (abdominal) pain. Round ligament discomfort is a common non-cancerous (benign) cause of abdominal pain in pregnancy. Your caregiver still must evaluate you.  You develop a bad headache that does not go away.  You develop fever, diarrhea, pain with urination, or shortness of breath.  You develop visual problems, blurry, or double vision.  You fall or are in a car accident or any kind of trauma.  There is mental or physical violence at home. Document Released: 12/22/2000 Document Revised: 03/22/2011 Document Reviewed: 06/26/2008 [employer] Patient Information 2013 [employer].  

## 2012-02-16 ENCOUNTER — Telehealth: Payer: Self-pay | Admitting: General Practice

## 2012-02-16 NOTE — Telephone Encounter (Signed)
Left message for pt. To call me back concerning vaginal discharge.

## 2012-02-16 NOTE — Telephone Encounter (Signed)
Patient called and said she is having a dark gray/brown discharge and she saw the doctor (Deirdre Poe) on Monday and told her about it but was told to come back in if this continues for another week for a procedure but she's had this d/c for 2 weeks now and she's tired of suffering through this and wants treatment before then and would like a call back.

## 2012-02-17 NOTE — Telephone Encounter (Signed)
Called patient at home, no answer. Left message for patient to give [place] a call back at the clinics

## 2012-02-18 ENCOUNTER — Telehealth: Payer: Self-pay | Admitting: *Deleted

## 2012-02-18 DIAGNOSIS — B373 Candidiasis of vulva and vagina: Secondary | ICD-10-CM

## 2012-02-18 MED ORDER — FLUCONAZOLE 150 MG PO TABS: 150.0000 mg | ORAL_TABLET | Freq: Once | ORAL | Status: DC

## 2012-02-18 NOTE — Telephone Encounter (Signed)
Rx sent to pharmacy. Pt has appt on Monday.

## 2012-02-18 NOTE — Telephone Encounter (Signed)
Message copied by Mannie Stabile on Fri Feb 18, 2012  8:37 AM ------      Message from: POE, DEIRDRE C      Created: Wed Feb 16, 2012  2:30 PM       Rx Diflucan 150 mg  for presumptive yeast infection. Will do WP at next visit if still concerns about discharge

## 2012-02-18 NOTE — Telephone Encounter (Signed)
See new tel enc. Dated 02/18/12.

## 2012-02-21 ENCOUNTER — Encounter: Payer: Self-pay | Admitting: *Deleted

## 2012-02-21 ENCOUNTER — Encounter: Payer: Medicaid Other | Admitting: Family Medicine

## 2012-02-22 ENCOUNTER — Other Ambulatory Visit: Payer: Self-pay | Admitting: Obstetrics & Gynecology

## 2012-02-28 ENCOUNTER — Encounter: Payer: Medicaid Other | Admitting: Advanced Practice Midwife

## 2012-03-06 ENCOUNTER — Encounter: Payer: Medicaid Other | Admitting: Family Medicine

## 2012-03-06 ENCOUNTER — Encounter: Payer: Self-pay | Admitting: *Deleted

## 2012-03-07 ENCOUNTER — Other Ambulatory Visit: Payer: Self-pay | Admitting: Family Medicine

## 2012-03-07 DIAGNOSIS — O24912 Unspecified diabetes mellitus in pregnancy, second trimester: Secondary | ICD-10-CM

## 2012-03-08 ENCOUNTER — Telehealth: Payer: Self-pay | Admitting: General Practice

## 2012-03-08 ENCOUNTER — Ambulatory Visit (HOSPITAL_COMMUNITY)
Admission: RE | Admit: 2012-03-08 | Discharge: 2012-03-08 | Disposition: A | Discharge status: Home / Self Care | Payer: Medicaid Other | Source: Ambulatory Visit | Attending: Obstetrics and Gynecology | Admitting: Obstetrics and Gynecology

## 2012-03-08 ENCOUNTER — Encounter (HOSPITAL_COMMUNITY): Payer: Self-pay

## 2012-03-08 VITALS — BP 124/74 | HR 95 | Wt 229.0 lb

## 2012-03-08 DIAGNOSIS — Z2839 Other underimmunization status: Secondary | ICD-10-CM

## 2012-03-08 DIAGNOSIS — Z3689 Encounter for other specified antenatal screening: Secondary | ICD-10-CM | POA: Insufficient documentation

## 2012-03-08 DIAGNOSIS — O24919 Unspecified diabetes mellitus in pregnancy, unspecified trimester: Secondary | ICD-10-CM

## 2012-03-08 NOTE — Telephone Encounter (Signed)
Message copied by Kathee Delton on Wed Mar 08, 2012  8:58 AM ------      Message from: FERRY, [place]      Created: Tue Mar 07, 2012  6:20 PM       Pt needs fasting c-peptide and fasting blood sugar to qualify for insulin pump. She will need to come in fasting (8 hours no food/beverage other than water) one morning this week. She missed her last prenatal visit. Can you please notify her that she needs the lab work and needs to reschedule appt if she has not already.      Thanks. ------

## 2012-03-08 NOTE — Telephone Encounter (Signed)
Called patient and informed her of need for labs and reminded her of her 3/3 prenatal visit at 11am. Patient verbalized understanding and stated she could come in this Friday at 9am fasting for the lab work. Confirmed both appts with patient again. Patient verbalized understanding and had no further questions

## 2012-03-10 ENCOUNTER — Other Ambulatory Visit: Payer: Medicaid Other

## 2012-03-10 DIAGNOSIS — O24912 Unspecified diabetes mellitus in pregnancy, second trimester: Secondary | ICD-10-CM

## 2012-03-11 LAB — C-PEPTIDE: C-Peptide: 2.03 ng/mL (ref 0.80–3.90)

## 2012-03-13 ENCOUNTER — Other Ambulatory Visit (HOSPITAL_COMMUNITY)
Admission: RE | Admit: 2012-03-13 | Discharge: 2012-03-13 | Disposition: A | Discharge status: Home / Self Care | Payer: Medicaid Other | Source: Ambulatory Visit | Attending: Family Medicine | Admitting: Family Medicine

## 2012-03-13 ENCOUNTER — Ambulatory Visit (INDEPENDENT_AMBULATORY_CARE_PROVIDER_SITE_OTHER): Payer: Medicaid Other | Admitting: Advanced Practice Midwife

## 2012-03-13 ENCOUNTER — Other Ambulatory Visit: Payer: Self-pay | Admitting: Family Medicine

## 2012-03-13 VITALS — BP 121/77 | Temp 97.6°F | Wt 225.4 lb

## 2012-03-13 DIAGNOSIS — N760 Acute vaginitis: Secondary | ICD-10-CM | POA: Insufficient documentation

## 2012-03-13 DIAGNOSIS — N898 Other specified noninflammatory disorders of vagina: Secondary | ICD-10-CM

## 2012-03-13 DIAGNOSIS — Z23 Encounter for immunization: Secondary | ICD-10-CM

## 2012-03-13 DIAGNOSIS — Z113 Encounter for screening for infections with a predominantly sexual mode of transmission: Secondary | ICD-10-CM | POA: Insufficient documentation

## 2012-03-13 DIAGNOSIS — O24912 Unspecified diabetes mellitus in pregnancy, second trimester: Secondary | ICD-10-CM

## 2012-03-13 DIAGNOSIS — O24919 Unspecified diabetes mellitus in pregnancy, unspecified trimester: Secondary | ICD-10-CM

## 2012-03-13 LAB — POCT URINALYSIS DIP (DEVICE)
Protein, ur: NEGATIVE mg/dL
Urobilinogen, UA: 0.2 mg/dL (ref 0.0–1.0)
pH: 7 (ref 5.0–8.0)

## 2012-03-13 LAB — CBC
MCH: 26.7 pg (ref 26.0–34.0)
MCHC: 33.4 g/dL (ref 30.0–36.0)
Platelets: 303 10*3/uL (ref 150–400)

## 2012-03-13 MED ORDER — TETANUS-DIPHTH-ACELL PERTUSSIS 5-2.5-18.5 LF-MCG/0.5 IM SUSP: 0.5000 mL | Freq: Once | INTRAMUSCULAR | Status: DC

## 2012-03-13 MED ORDER — METRONIDAZOLE 500 MG PO TABS: 500.0000 mg | ORAL_TABLET | Freq: Two times a day (BID) | ORAL | Status: DC

## 2012-03-13 NOTE — Progress Notes (Signed)
Normal u/s on 2/26, f/u growth in 4 weeks. Discharge and odor - wet prep collected.

## 2012-03-13 NOTE — Progress Notes (Signed)
P=98, c/o upper back and under shoulder blade pain, cramping 3 times in last month, c/o thick gray discharge.

## 2012-03-14 LAB — RPR

## 2012-03-20 ENCOUNTER — Encounter: Payer: Medicaid Other | Admitting: Family Medicine

## 2012-03-27 ENCOUNTER — Encounter: Payer: Medicaid Other | Admitting: Obstetrics & Gynecology

## 2012-04-03 ENCOUNTER — Other Ambulatory Visit: Payer: Self-pay | Admitting: Obstetrics and Gynecology

## 2012-04-03 ENCOUNTER — Encounter: Payer: Medicaid Other | Admitting: Obstetrics and Gynecology

## 2012-04-03 DIAGNOSIS — O24919 Unspecified diabetes mellitus in pregnancy, unspecified trimester: Secondary | ICD-10-CM

## 2012-04-05 ENCOUNTER — Ambulatory Visit (HOSPITAL_COMMUNITY)
Admission: RE | Admit: 2012-04-05 | Discharge: 2012-04-05 | Disposition: A | Discharge status: Home / Self Care | Payer: Medicaid Other | Source: Ambulatory Visit | Attending: Obstetrics and Gynecology | Admitting: Obstetrics and Gynecology

## 2012-04-05 VITALS — BP 122/75 | HR 97 | Wt 234.5 lb

## 2012-04-05 DIAGNOSIS — O24919 Unspecified diabetes mellitus in pregnancy, unspecified trimester: Secondary | ICD-10-CM | POA: Insufficient documentation

## 2012-04-05 NOTE — Progress Notes (Signed)
[facility] ultrasound  Indication: 27 yr old G1P0 at [redacted]w[redacted]d with type II diabetes for fetal growth ultrasound.  Findings: 1. Single intrauterine pregnancy. 2. Estimated fetal weight is in the 72nd%. 3. Posterior placenta without evidence of previa. 4. Normal amniotic fluid index. 5. The limited anatomy survey is normal.  Recommendations: 1. Appropriate fetal growth. 2. Diabetes: - recommend strict glucose control with fastings <90 and 2 hour postprandial <120 - recommend fetal growth every 4 weeks - recommend antenatal testing starting at [redacted] weeks gestation with either twice weekly nonstress tests and weekly amniotic fluid index or weekly biophysical profiles - recommend delivery by estimated due date but not prior to 39 weeks in the absence of other complications and in the setting of good glucose control - had normal fetal echocardiogram - recommend fetal kick counts 3. Fetal heart views continue to be limited: - will continue to attempt on follow up ultrasounds - had normal fetal echocardiogram (although slightly limited)  Eulis Foster, MD

## 2012-04-10 ENCOUNTER — Encounter: Payer: Medicaid Other | Admitting: Obstetrics and Gynecology

## 2012-04-10 ENCOUNTER — Telehealth: Payer: Self-pay

## 2012-04-10 NOTE — Telephone Encounter (Signed)
Patient reports that she starts an insulin pump on Weds. But will run out of her Novolog before then.  She would like a refill.

## 2012-04-11 ENCOUNTER — Telehealth: Payer: Self-pay | Admitting: *Deleted

## 2012-04-11 NOTE — Telephone Encounter (Signed)
I called pt's pharmacy and was told that Karina [name] picked up her Novolog refill yesterday.  Pt has appt tomorrow for insulin pump instruction with Pincus Large.

## 2012-04-11 NOTE — Telephone Encounter (Signed)
Erroneous encounter

## 2012-04-12 ENCOUNTER — Encounter: Payer: Medicaid Other | Attending: Obstetrics & Gynecology | Admitting: *Deleted

## 2012-04-12 DIAGNOSIS — Z713 Dietary counseling and surveillance: Secondary | ICD-10-CM | POA: Insufficient documentation

## 2012-04-12 DIAGNOSIS — O24919 Unspecified diabetes mellitus in pregnancy, unspecified trimester: Secondary | ICD-10-CM | POA: Insufficient documentation

## 2012-04-12 DIAGNOSIS — E119 Type 2 diabetes mellitus without complications: Secondary | ICD-10-CM | POA: Insufficient documentation

## 2012-04-13 ENCOUNTER — Encounter: Payer: Medicaid Other | Admitting: *Deleted

## 2012-04-13 NOTE — Patient Instructions (Signed)
Plan: Participate in My Learning modules on [employer] Diabetes Web Site Bring Novolog insulin cartridges to appointment tomorrow for pump start Do not take NPH insulin tomorrow AM prior to pump start planned for 11:45 AM Do take all Novolog as usual prior to meals the day of pump start

## 2012-04-13 NOTE — Progress Notes (Signed)
Insulin Pump Start Progress Note:  Patient appointment start time: 1200  End time 1330  Patient here for insulin pump start on Medtronic Revel pump and Silhouette 23" infusion set Orders with pump settings received from MD Patient completed Pre- training by return demonstration  Reviewed Pump Set Up including  Menu Settings  Bolus with Carb Ratio of 1 unit / 6.5 grams Carb, Correction Factor of 1 unit / 22 mg/dl, Target of 29-562 mg/dl  Suspend  Basal with initial Basal Rate of 1.60 units/hour  Reservoir Set Up  Utilities Pump Training Checklist completed Patient omitted taking their long acting insulin of NPH this AM as directed  Patient is to sign up for [employer] and agrees to upload by 04/20/12 for review of progress and allow for pump setting adjustments  Patient successfully completed pump start and instructed to call me if BG drops below 60 mg/dl or goes above 130 mg/dl or as directed by MD Patient agrees to check BG pre meal, bedtime and post meal as able. If she gets up during the night, a BG would be helpful.  Follow up plan: patient to call me by 8 PM tomorrow with written BG report. Once she signs up for [facility] she will then upload her pump instead of recording BG's on Log Sheet. Follow up visit planned in 1 week as well.

## 2012-04-13 NOTE — Progress Notes (Signed)
Introduction to Insulin Pump Therapy:  Appt start time: 1600 end time:  1700.  Assessment:  This patient has DM 2 and is pregnant and their primary concerns today: to get started on new Medtronic Revel insulin pump She arrived 1 hour late for appointment so we will do pre-pump training today and start her on the pump tomorrow.  She is here with her husband, Casimiro Needle  MEDICATIONS: Basal Insulin: 42 units of NPH at AM and PM; 20 units in AM and 22 units in PM via pen  Bolus Insulin: 63 units of Novolog at pre meal  via pen divided between 3 meals Total of insulin doses per day 105 Other diabetes medications: none  This patient is not currently adjusting bolus insulin based BG This patient is not currently adjusting bolus insulin based on carb intake   Patient states knowledge of Carb Counting is good  Usual physical activity: not discussed at this visit. 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  Patient states their expectations of pump therapy include: no more injections Patient expresses understanding that for improved outcomes for their diabetes on an insulin pump they will:  Check BG 4-6 times per day  Change out pump infusion set at least every 3 days  Upload pump information to software on a regular basis so provider can assess patterns and make setting adjustments.      Intervention:    Taught difference between delivery of insulin via syringe/pen compared to insulin pump.  Demonstrated improved insulin delivery via pump due to improved accuracy of dose and flexibility of adjusting bolus  insulin based on carb intake and BG correction.  Demonstrated pump, insulin reservoir and infusion set options, and button pushing for bolus delivery of insulin through the pump  Explained importance of testing BG at least 4 times per day for appropriate correction of high BG and prevention of DKA as applicable.  Emphasized importance of follow up after Pump Start for appropriate pump setting adjustments and on-going training on more advanced features. Also discussed the importance of good communication via cell phone as part of follow up plan  Handouts given during visit include:  Reviewed Training Manuals that came with her pump today  Monitoring/Evaluation:    Patient does want to continue with wearing the insulin pump.  Patient instructed to go to [employer] pump web-site to complete learning module on insulin pump and bring Certificate of Completion to next visit  Scheduled patient to come back tomorrow AM for actual pump start. She was not able to get Rx for Novolog in vials so I will have her bring her pen cartridges and use those to fill reservoir for pump for now.

## 2012-04-17 ENCOUNTER — Other Ambulatory Visit: Payer: Self-pay | Admitting: Obstetrics & Gynecology

## 2012-04-17 ENCOUNTER — Telehealth: Payer: Self-pay | Admitting: *Deleted

## 2012-04-17 ENCOUNTER — Encounter: Payer: Self-pay | Admitting: Obstetrics & Gynecology

## 2012-04-17 ENCOUNTER — Ambulatory Visit (INDEPENDENT_AMBULATORY_CARE_PROVIDER_SITE_OTHER): Payer: Medicaid Other | Admitting: Obstetrics & Gynecology

## 2012-04-17 VITALS — BP 120/80 | Temp 97.5°F | Wt 233.0 lb

## 2012-04-17 DIAGNOSIS — O24919 Unspecified diabetes mellitus in pregnancy, unspecified trimester: Secondary | ICD-10-CM

## 2012-04-17 DIAGNOSIS — O24913 Unspecified diabetes mellitus in pregnancy, third trimester: Secondary | ICD-10-CM

## 2012-04-17 LAB — POCT URINALYSIS DIP (DEVICE)
Glucose, UA: NEGATIVE mg/dL
Hgb urine dipstick: NEGATIVE
Nitrite: NEGATIVE
Protein, ur: NEGATIVE mg/dL
Urobilinogen, UA: 0.2 mg/dL (ref 0.0–1.0)

## 2012-04-17 NOTE — Telephone Encounter (Signed)
  Pump Follow Up Progress Note for Karina [name], DOB 07-Nov-1985  Orders received from MD giving me permission to make insulin pump adjustments for the following patient.  Reviewed blood glucose logs on 04/17/2012 [id number] and found the following:            Hypoglycemia Hyperglycemia Comments  Overnight Period:   YES Basal  Pre-Meal:    Breakfast  YES Basal   Lunch  YES Basal   Supper  YES Basal  Post-Meal: Breakfast      Lunch      Supper     Bedtime:   YES Basal   Comments: Post meal BGs not going as high, FBG and pre meals still a little too high so increasing basal rate by 10% today  Pump Settings: Date: Current Date:    Basal Rate: Carb Ratio Sensitivity  Basal Rate: Carb Ratio Sensitivity   MN: 1.75 MN: 5.5     22 MN: 1.90 (+) MN: 5.5       22    11  AM: 6.5    11 AM: 6.5                                                 Plan: Patient using pump well, was able to change out successfully yesterday. Patient to continue checking BG pre meal and post meal as directed and use Bolus Wizard accordingly for meal time and correction doses of insulin  Follow up: Patient to upload to [facility] within 3 days for further review Follow up visit planned with me in 2 days - 04/19/2012

## 2012-04-17 NOTE — Progress Notes (Signed)
Pt downloaded pump info last night.  Bev Paddock (Diabetes nutrition) changed her basal rate.  Details should be in a progress note later today.  Start twice weekly testing today.  70% at 30 weeks.  Will get growth in 2 weeks (Diane Day will coordinate with AFI weekly testing).

## 2012-04-17 NOTE — Progress Notes (Signed)
Pulse 111 Edema trace in ankles and feet.

## 2012-04-18 ENCOUNTER — Encounter: Payer: Self-pay | Admitting: *Deleted

## 2012-04-19 ENCOUNTER — Encounter (HOSPITAL_COMMUNITY): Payer: Self-pay

## 2012-04-19 ENCOUNTER — Encounter: Payer: Medicaid Other | Admitting: *Deleted

## 2012-04-19 ENCOUNTER — Inpatient Hospital Stay (HOSPITAL_COMMUNITY)
Admission: AD | Admit: 2012-04-19 | Discharge: 2012-04-19 | Disposition: A | Discharge status: Home / Self Care | Payer: Medicaid Other | Source: Ambulatory Visit | Attending: Obstetrics & Gynecology | Admitting: Obstetrics & Gynecology

## 2012-04-19 ENCOUNTER — Inpatient Hospital Stay (HOSPITAL_COMMUNITY): Payer: Medicaid Other

## 2012-04-19 DIAGNOSIS — O99891 Other specified diseases and conditions complicating pregnancy: Secondary | ICD-10-CM | POA: Insufficient documentation

## 2012-04-19 DIAGNOSIS — Z283 Underimmunization status: Secondary | ICD-10-CM

## 2012-04-19 DIAGNOSIS — Z2839 Other underimmunization status: Secondary | ICD-10-CM

## 2012-04-19 DIAGNOSIS — O26899 Other specified pregnancy related conditions, unspecified trimester: Secondary | ICD-10-CM

## 2012-04-19 DIAGNOSIS — O9989 Other specified diseases and conditions complicating pregnancy, childbirth and the puerperium: Secondary | ICD-10-CM

## 2012-04-19 DIAGNOSIS — R109 Unspecified abdominal pain: Secondary | ICD-10-CM | POA: Insufficient documentation

## 2012-04-19 LAB — URINALYSIS, ROUTINE W REFLEX MICROSCOPIC
Hgb urine dipstick: NEGATIVE
Ketones, ur: NEGATIVE mg/dL
Leukocytes, UA: NEGATIVE
Nitrite: NEGATIVE
Protein, ur: NEGATIVE mg/dL

## 2012-04-19 LAB — GLUCOSE, CAPILLARY: Glucose-Capillary: 108 mg/dL — ABNORMAL HIGH (ref 70–99)

## 2012-04-19 NOTE — MAU Note (Signed)
Patient states she started having abdominal pain last night. States pain has continued on and off today on both sides at mid level. Denies bleeding or leaking and reports good fetal movement.

## 2012-04-19 NOTE — MAU Note (Signed)
Okay to keep off monitors per [facility], CNM who reviewed efm tracing.

## 2012-04-19 NOTE — MAU Provider Note (Signed)
History     CSN: [id number]  Arrival date and time: 04/19/12 1543   First Provider Initiated Contact with Patient 04/19/12 1732      Chief Complaint  Patient presents with  . Abdominal Pain   HPI  Pt is a G1P0 at 32.4 wks IUP here with report of abdominal pain that started last night.  Pain is described as sharp and started on upper abdomen.  No report of vaginal bleeding or leaking of fluid.  +fetal movement.     Past Medical History  Diagnosis Date  . Diabetes mellitus     Type 2, insulin resistant    Past Surgical History  Procedure Laterality Date  . Cervical biopsy  2004    Family History  Problem Relation Age of Onset  . Diabetes Father     History  Substance Use Topics  . Smoking status: Never Smoker   . Smokeless tobacco: Never Used  . Alcohol Use: No    Allergies: No Known Allergies  Prescriptions prior to admission  Medication Sig Dispense Refill  . acetaminophen (TYLENOL) 325 MG tablet Take 650 mg by mouth every 6 (six) hours as needed for pain.       . calcium carbonate (TUMS - DOSED IN MG ELEMENTAL CALCIUM) 500 MG chewable tablet Chew 2 tablets by mouth daily as needed for heartburn.      . Insulin Human (INSULIN PUMP) 100 unit/ml SOLN Inject 2.1 each into the skin continuous. Novolog insulin basal rate is 2.1 units      . Prenatal Vit-Fe Fumarate-FA (PRENATAL MULTIVITAMIN) TABS Take 1 tablet by mouth daily.      . [DISCONTINUED] Prenatal Vit-Fe Fumarate-FA (PRENATAL MULTIVITAMIN) TABS Take 1 tablet by mouth daily.  30 tablet  6  . glucose blood test strip Use as instructed- 4 times daily.  100 each  12    Review of Systems  Gastrointestinal: Positive for abdominal pain.  All other systems reviewed and are negative.   Physical Exam   Blood pressure 133/80, pulse 92, temperature 98.8 F (37.1 C), temperature source Oral, resp. rate 20, height 5\' 2"  (1.575 m), weight 108.228 kg (238 lb 9.6 oz), last menstrual period 09/04/2011, SpO2  100.00%.  Physical Exam  Constitutional: She is oriented to person, place, and time. She appears well-developed and well-nourished. No distress.  HENT:  Head: Normocephalic.  Neck: Normal range of motion. Neck supple.  Cardiovascular: Normal rate, regular rhythm and normal heart sounds.  Exam reveals no gallop and no friction rub.   No murmur heard. Respiratory: Effort normal and breath sounds normal. No respiratory distress.  GI: Soft. There is no tenderness. There is no CVA tenderness.  Genitourinary: Cervix exhibits no motion tenderness and no discharge. No bleeding around the vagina. Vaginal discharge (mucusy) found.  Abdomen palpates tense - will check AFI  Musculoskeletal: Normal range of motion.  Neurological: She is alert and oriented to person, place, and time.  Skin: Skin is warm and dry.  Psychiatric: She has a normal mood and affect.  Dilation: Closed Effacement (%): Thick Cervical Position: Posterior Station: Ballotable Exam by:: Roney Marion, CNM   MAU Course  Procedures Results for orders placed during the hospital encounter of 04/19/12 (from the past 24 hour(s))  URINALYSIS, ROUTINE W REFLEX MICROSCOPIC     Status: Abnormal   Collection Time    04/19/12  4:00 PM      Result Value Range   Color, Urine YELLOW  YELLOW   APPearance CLEAR  CLEAR  Specific Gravity, Urine 1.015  1.005 - 1.030   pH 6.5  5.0 - 8.0   Glucose, UA 250 (*) NEGATIVE mg/dL   Hgb urine dipstick NEGATIVE  NEGATIVE   Bilirubin Urine NEGATIVE  NEGATIVE   Ketones, ur NEGATIVE  NEGATIVE mg/dL   Protein, ur NEGATIVE  NEGATIVE mg/dL   Urobilinogen, UA 0.2  0.0 - 1.0 mg/dL   Nitrite NEGATIVE  NEGATIVE   Leukocytes, UA NEGATIVE  NEGATIVE  GLUCOSE, CAPILLARY     Status: Abnormal   Collection Time    04/19/12  6:38 PM      Result Value Range   Glucose-Capillary 108 (*) 70 - 99 mg/dL   Comment 1 Notify RN      Ultrasound: MATERNAL FINDINGS:  Cervix: Closed. 3.1 cm in length.  IMPRESSION:   Unremarkable limited obstetrical ultrasound.  AFI 10 Normal appearance of a single intrauterine pregnancy as described.  Recommend followup with non-emergent complete OB 14+ wk US  examination for fetal biometric evaluation and anatomic survey if  not already performed.   FHR 130's, +accels, reactive Toco - none Assessment and Plan  Abdominal Pain in Pregnancy - Normal Exam/Diagnostics Category I FHR  Plan: DC to home Provided reassuance Precautions given Keep scheduled appoint ent.  [facility] 04/19/2012, 5:35 PM

## 2012-04-19 NOTE — MAU Note (Signed)
Pt states no prior hx of ptl, pain began last pm, denies bleeding or lof.

## 2012-04-20 ENCOUNTER — Ambulatory Visit (INDEPENDENT_AMBULATORY_CARE_PROVIDER_SITE_OTHER): Payer: Medicaid Other | Admitting: *Deleted

## 2012-04-20 DIAGNOSIS — O24913 Unspecified diabetes mellitus in pregnancy, third trimester: Secondary | ICD-10-CM

## 2012-04-20 DIAGNOSIS — O24919 Unspecified diabetes mellitus in pregnancy, unspecified trimester: Secondary | ICD-10-CM

## 2012-04-20 NOTE — Progress Notes (Signed)
Pt states she had evaluation @ MAU yesterday due to abdominal pain. Chart reviewed. Pt had reactive FHR, normal AFI and no cervix dilation yesterday. She is not having any problems today. Consult w/Dr. Debroah Loop - he stated that pt did not need fetal testing today since all was normal yesterday. She was instructed to keep next clinic appt as scheduled on 4/14. Pt was satisfied and voiced understanding.

## 2012-04-21 NOTE — Progress Notes (Signed)
  Pump Follow Up Progress Note  Orders received from MD giving me permission to make insulin pump adjustments for the following patient.  Reviewed blood glucose logs on 04/19/2012 via: [facility] and found the following:            Hypoglycemia Hyperglycemia Comments  Overnight Period:      Pre-Meal:    Breakfast      Lunch      Supper  YES Basal  Post-Meal: Breakfast      Lunch      Supper     Bedtime:   YES Basal   Comments: BGs climbing after 2 PM so plan to increase Basal Rate by 10% at that time of day. Post meal BGs do not appear to be climbing excessively at this time, so will leave Carb Ratio alone. She is using pump appropriately, uses Bolus Wizard for meal time and correction insulin and states no problems changing out reservoir and infusion set every 3 days.   Pump Settings:Changes made will be in bold print under date of visit Date: Current Date: 04/19/2012   Basal Rate: Carb Ratio Sensitivity  Basal Rate: Carb Ratio Sensitivity   MN: 1.90 MN: 5.5 22 MN: 1.90 MN: 5.5 22    11:00 A: 6.5  2 PM: 2.10 (+) 11:00 A: 6.5                                                 Plan: patient to upload to [employer] by Monday so I can see reports on Tuesday AM when I return to work. She has my cell # should she need to reach me over the weekend and understands to notify me if BG is outside reasonable limits for pregnancy.   Follow up:  Patient to upload to [facility] within 4 days for further review

## 2012-04-23 ENCOUNTER — Other Ambulatory Visit: Payer: Self-pay | Admitting: Obstetrics & Gynecology

## 2012-04-24 ENCOUNTER — Ambulatory Visit (INDEPENDENT_AMBULATORY_CARE_PROVIDER_SITE_OTHER): Payer: Medicaid Other | Admitting: Obstetrics & Gynecology

## 2012-04-24 VITALS — BP 139/84 | Temp 97.8°F | Wt 235.0 lb

## 2012-04-24 DIAGNOSIS — O24919 Unspecified diabetes mellitus in pregnancy, unspecified trimester: Secondary | ICD-10-CM

## 2012-04-24 DIAGNOSIS — O24913 Unspecified diabetes mellitus in pregnancy, third trimester: Secondary | ICD-10-CM

## 2012-04-24 LAB — POCT URINALYSIS DIP (DEVICE)
Hgb urine dipstick: NEGATIVE
Ketones, ur: NEGATIVE mg/dL
Leukocytes, UA: NEGATIVE
Protein, ur: NEGATIVE mg/dL
pH: 7 (ref 5.0–8.0)

## 2012-04-24 MED ORDER — INSULIN ASPART 100 UNIT/ML ~~LOC~~ SOLN
SUBCUTANEOUS | Status: DC
Start: 2012-04-24 — End: 2012-05-08

## 2012-04-24 MED ORDER — PRENATAL MULTIVITAMIN CH
1.0000 | ORAL_TABLET | Freq: Every day | ORAL | Status: DC
Start: 2012-04-24 — End: 2012-05-30

## 2012-04-24 NOTE — Progress Notes (Signed)
Pulse- 98 

## 2012-04-24 NOTE — Progress Notes (Signed)
On insulin pump, [facility].  Refilled Novolog.  NST performed today was reviewed and was found to be reactive.  Continue recommended antenatal testing and prenatal care. No other complaints or concerns.  Fetal movement and labor precautions reviewed.

## 2012-04-24 NOTE — Progress Notes (Signed)
Growth [place] scheduled on 4/24 @ MFM

## 2012-04-24 NOTE — Patient Instructions (Signed)
Return to clinic for any obstetric concerns or go to [facility] for evaluation  

## 2012-04-26 ENCOUNTER — Telehealth: Payer: Self-pay | Admitting: *Deleted

## 2012-04-26 NOTE — Telephone Encounter (Signed)
  Pump Follow Up Progress Note: Karina, [name] 27 y.o., 30-Mar-1985  Orders received from MD giving me permission to make insulin pump adjustments for this patient.  Reviewed blood glucose logs on 04/26/2012 via: [facility] and found the following:            Hypoglycemia Hyperglycemia Comments  Overnight Period:   YES Basal  Pre-Meal:    Breakfast      Lunch  YES Basal   Supper  YES Basal  Post-Meal: Breakfast      Lunch      Supper     Bedtime:   YES Basal   Comments: FBG and pre-meal BG's too high, plan to increase Basal rates by 15%. Post meal BG's going up on average less than 30 mg/dl so no change to Carb Ratio at this time.  Pump Settings:Changes made are in bold print on date of visit with pt. Date: Current Date: 04/26/2012   Basal Rate: Carb Ratio Sensitivity  Basal Rate: Carb Ratio Sensitivity   MN: 1.90 MN: 5.5       22 MN: 2.15 MN: 5.5   22   2  PM 2.10 11 A: 6.5  2 PM 2.40 11 A: 6.5                                                 Plan: Patient to make adjustments to pump as directed. I have asked pt to call me anytime BG above 140 mg/dl in the future.  Follow up:  Patient to upload to [facility] within 5 days for further review

## 2012-04-27 ENCOUNTER — Ambulatory Visit (INDEPENDENT_AMBULATORY_CARE_PROVIDER_SITE_OTHER): Payer: Medicaid Other | Admitting: *Deleted

## 2012-04-27 VITALS — BP 139/86

## 2012-04-27 DIAGNOSIS — O24919 Unspecified diabetes mellitus in pregnancy, unspecified trimester: Secondary | ICD-10-CM

## 2012-04-27 DIAGNOSIS — O24913 Unspecified diabetes mellitus in pregnancy, third trimester: Secondary | ICD-10-CM

## 2012-04-27 NOTE — Progress Notes (Signed)
BPP 10/10 today, AFI 15.cm, next testing date is on 05/04/12. Continue recommended antenatal testing and prenatal care.

## 2012-04-27 NOTE — Progress Notes (Signed)
P = 91    Pt states she cannot have NST on 4/21 as she will be OOT.  BPP performed today and next appt scheduled on 05/04/12.

## 2012-05-03 ENCOUNTER — Ambulatory Visit (HOSPITAL_COMMUNITY): Payer: Medicaid Other

## 2012-05-03 ENCOUNTER — Telehealth: Payer: Self-pay | Admitting: *Deleted

## 2012-05-03 NOTE — Telephone Encounter (Signed)
Patient agreed to upload her insulin pump this past weekend for me to review and make insulin adjustments. She has not uploaded yet this week after 2 text messages to her requesting same. I called her today and left message on her cell phone explaining the importance of providing me with BG information as soon as possible.

## 2012-05-04 ENCOUNTER — Telehealth: Payer: Self-pay | Admitting: *Deleted

## 2012-05-04 ENCOUNTER — Ambulatory Visit (HOSPITAL_COMMUNITY)
Admission: RE | Admit: 2012-05-04 | Discharge: 2012-05-04 | Disposition: A | Discharge status: Home / Self Care | Payer: Medicaid Other | Source: Ambulatory Visit | Attending: Obstetrics & Gynecology | Admitting: Obstetrics & Gynecology

## 2012-05-04 ENCOUNTER — Other Ambulatory Visit (HOSPITAL_COMMUNITY): Payer: Self-pay | Admitting: Obstetrics and Gynecology

## 2012-05-04 ENCOUNTER — Ambulatory Visit (HOSPITAL_COMMUNITY): Admission: RE | Admit: 2012-05-04 | Payer: Medicaid Other | Source: Ambulatory Visit

## 2012-05-04 VITALS — BP 136/74 | HR 92 | Wt 245.0 lb

## 2012-05-04 DIAGNOSIS — O24919 Unspecified diabetes mellitus in pregnancy, unspecified trimester: Secondary | ICD-10-CM

## 2012-05-04 DIAGNOSIS — Z3689 Encounter for other specified antenatal screening: Secondary | ICD-10-CM | POA: Insufficient documentation

## 2012-05-04 DIAGNOSIS — O24913 Unspecified diabetes mellitus in pregnancy, third trimester: Secondary | ICD-10-CM

## 2012-05-04 NOTE — Progress Notes (Signed)
Elisheva Fallas  was seen today for an ultrasound appointment.  See full report in [facility].  Impression: Single IUP at 34 5/7 weeks Normal interval anatomy Fetal growth is appropriate (61st %tile) Normal amniotic fluid volume  Recommendations: Recommend continued antenatal testing with either twice weekly nonstress tests and weekly amniotic fluid index or weekly biophysical profiles Recommend delivery by estimated due date but not prior to 39 weeks in the absence of other complications and in the setting of good glucose control Recommend follow-up ultrasound examination in 3 weeks for interval growth  Alpha Gula, MD

## 2012-05-04 NOTE — Telephone Encounter (Signed)
  Pump Follow Up Progress Note  Patient responded to my request for BG information by uploading her pump to [facility] on 05/03/2012. I reviewed these reports this AM.  Orders received from MD giving me permission to make insulin pump adjustments for the following patient. Karina [name], Karina [name] Female, 27 y.o., 05-Nov-1985  Reviewed blood glucose logs on  05/04/2012 via: [facility]  and found the following:            Hypoglycemia Hyperglycemia Comments  Overnight Period:   YES But stable Bedtime BG starting too high  Pre-Meal:    Breakfast      Lunch  YES Sensitivity Factor   Supper  YES Sensitivity Factor  Post-Meal: Breakfast      Lunch      Supper     Bedtime:   YES Sensitivity Factor   Comments: BG's are stable so Basal Rates are appropriate now. Post meal BGs are not going up more than 20 mg/dl so Carb Ratio OK too. Will increase Sensitivity Factor to bring BGs down to Target Range better.   Pump Settings:Changes made are in bold print under date of visit. Date: Current Date: 05/04/2012   Basal Rate: Carb Ratio Sensitivity  Basal Rate: Carb Ratio Sensitivity   MN: 2.15 MN: 5.5 22 MN: 2.15 MN: 5.5 20  2  PM 2.40 11 AM: 6.5  2 PM 2.40 11 AM: 6.5                                                 Plan: patient to continue with use of Bolus Wizard and contact me if BG go below 60 or above 140 md/dl.  Follow up:  Patient to upload to [facility] within 4 days for further review

## 2012-05-08 ENCOUNTER — Other Ambulatory Visit: Payer: Self-pay | Admitting: Obstetrics & Gynecology

## 2012-05-08 ENCOUNTER — Inpatient Hospital Stay (HOSPITAL_COMMUNITY)
Admission: AD | Admit: 2012-05-08 | Discharge: 2012-05-08 | Disposition: A | Discharge status: Home / Self Care | Payer: Medicaid Other | Source: Ambulatory Visit | Attending: Obstetrics and Gynecology | Admitting: Obstetrics and Gynecology

## 2012-05-08 ENCOUNTER — Encounter (HOSPITAL_COMMUNITY): Payer: Self-pay | Admitting: *Deleted

## 2012-05-08 ENCOUNTER — Ambulatory Visit (INDEPENDENT_AMBULATORY_CARE_PROVIDER_SITE_OTHER): Payer: Medicaid Other | Admitting: Family

## 2012-05-08 VITALS — BP 135/95 | Temp 97.1°F | Wt 240.7 lb

## 2012-05-08 DIAGNOSIS — O24913 Unspecified diabetes mellitus in pregnancy, third trimester: Secondary | ICD-10-CM

## 2012-05-08 DIAGNOSIS — O9989 Other specified diseases and conditions complicating pregnancy, childbirth and the puerperium: Secondary | ICD-10-CM

## 2012-05-08 DIAGNOSIS — IMO0002 Reserved for concepts with insufficient information to code with codable children: Secondary | ICD-10-CM | POA: Insufficient documentation

## 2012-05-08 DIAGNOSIS — R109 Unspecified abdominal pain: Secondary | ICD-10-CM | POA: Insufficient documentation

## 2012-05-08 DIAGNOSIS — N949 Unspecified condition associated with female genital organs and menstrual cycle: Secondary | ICD-10-CM

## 2012-05-08 DIAGNOSIS — O24919 Unspecified diabetes mellitus in pregnancy, unspecified trimester: Secondary | ICD-10-CM

## 2012-05-08 DIAGNOSIS — Z2839 Other underimmunization status: Secondary | ICD-10-CM

## 2012-05-08 DIAGNOSIS — Z283 Underimmunization status: Secondary | ICD-10-CM

## 2012-05-08 LAB — POCT URINALYSIS DIP (DEVICE)
Protein, ur: NEGATIVE mg/dL
Specific Gravity, Urine: 1.015 (ref 1.005–1.030)
Urobilinogen, UA: 1 mg/dL (ref 0.0–1.0)
pH: 7 (ref 5.0–8.0)

## 2012-05-08 NOTE — Progress Notes (Signed)
[place] for growth and BPP done on 4/24.

## 2012-05-08 NOTE — MAU Provider Note (Signed)
History     CSN: [id number]  Arrival date and time: 05/08/12 1656   None     Chief Complaint  Patient presents with  . Edema  . Abdominal Pain   HPI 27 y.o. G1P0 at [redacted]w[redacted]d with c/o swelling and discomfort in feet and hands and low abd pressure which is constant. No contractions, bleeding, leaking fluid. No headaches, vision changes, RUQ or epigastric pain. Pregnancy is complicated by insulin dependent type 2 DM, pt has insulin pump. Had her regular prenatal visit today, states everything was fine and her blood sugar numbers are looking good, but she is generally not feeling well.   Past Medical History  Diagnosis Date  . Diabetes mellitus     Type 2, insulin resistant    Past Surgical History  Procedure Laterality Date  . Cervical biopsy  2004    Family History  Problem Relation Age of Onset  . Diabetes Father     History  Substance Use Topics  . Smoking status: Never Smoker   . Smokeless tobacco: Never Used  . Alcohol Use: No    Allergies: No Known Allergies  Prescriptions prior to admission  Medication Sig Dispense Refill  . acetaminophen (TYLENOL) 325 MG tablet Take 650 mg by mouth every 6 (six) hours as needed for pain.       . calcium carbonate (TUMS - DOSED IN MG ELEMENTAL CALCIUM) 500 MG chewable tablet Chew 2 tablets by mouth daily as needed for heartburn.      . insulin aspart (NOVOLOG) 100 UNIT/ML injection Inject 2.1-2.4 Units into the skin continuous. At 12:00 a.m. inject 2.15 units continuous UNTIL 2:00 p.m.  At 2:00 p.m. inject 2.4 units continuous UNTIL 12:00 a.m. using insulin pump.      . Prenatal Vit-Fe Fumarate-FA (PRENATAL MULTIVITAMIN) TABS Take 1 tablet by mouth daily.  30 tablet  5  . [DISCONTINUED] insulin aspart (NOVOLOG) 100 UNIT/ML injection Please use with insulin pump as instructed  3 vial  5    Review of Systems  Constitutional: Positive for malaise/fatigue.  HENT: Negative.   Eyes: Negative.  Negative for blurred vision.   Respiratory: Negative.  Negative for shortness of breath.   Cardiovascular: Negative.  Negative for chest pain.  Gastrointestinal: Positive for abdominal pain. Negative for nausea and vomiting.  Genitourinary: Negative.        Negative for contractions, bleeding   Musculoskeletal: Positive for back pain.  Skin: Negative.   Neurological: Negative.  Negative for headaches.  Psychiatric/Behavioral: Negative.    Physical Exam   Blood pressure 130/83, pulse 88, temperature 97.9 F (36.6 C), temperature source Oral, resp. rate 18, height 5\' 2"  (1.575 m), weight 246 lb (111.585 kg), last menstrual period 09/04/2011.  Filed Vitals:   05/08/12 1724 05/08/12 1745 05/08/12 1801  BP: 130/83 128/75 129/75  Pulse: 88 95 97  Temp: 97.9 F (36.6 C)    TempSrc: Oral    Resp: 18    Height: 5\' 2"  (1.575 m)    Weight: 246 lb (111.585 kg)       Physical Exam  Nursing note and vitals reviewed. Constitutional: She is oriented to person, place, and time. She appears well-developed and well-nourished. No distress.  Obese   Cardiovascular: Normal rate.   Respiratory: Effort normal.  GI: Soft. She exhibits no mass. Distention: c/w dates. There is tenderness (diffuse low abd, sympysis pubis). There is no rebound and no guarding.  Genitourinary:  Dilation: Fingertip Effacement (%): 50 Exam by:: N.Frazier,CNM   Musculoskeletal:  Normal range of motion. She exhibits edema (1+ BLE).  Neurological: She is alert and oriented to person, place, and time. She has normal reflexes.  Skin: Skin is warm and dry.  Psychiatric: She has a normal mood and affect.   Reactive NST, TOCO quiet MAU Course  Procedures Results for orders placed in visit on 05/08/12 (from the past 72 hour(s))  POCT URINALYSIS DIP (DEVICE)     Status: None   Collection Time    05/08/12 10:53 AM      Result Value Range   Glucose, UA NEGATIVE  NEGATIVE mg/dL   Bilirubin Urine NEGATIVE  NEGATIVE   Ketones, ur NEGATIVE  NEGATIVE  mg/dL   Specific Gravity, Urine 1.015  1.005 - 1.030   Hgb urine dipstick NEGATIVE  NEGATIVE   pH 7.0  5.0 - 8.0   Protein, ur NEGATIVE  NEGATIVE mg/dL   Urobilinogen, UA 1.0  0.0 - 1.0 mg/dL   Nitrite NEGATIVE  NEGATIVE   Leukocytes, UA NEGATIVE  NEGATIVE   Comment: Biochemical Testing Only. Please order routine urinalysis from main lab if confirmatory testing is needed.     Assessment and Plan  27 y.o. G1P0 at [redacted]w[redacted]d Generalized discomforts of late third trimester - rev'd normal discomforts vs. Warning signs F/U Thursday for NST and in 1 week for return OB visit or sooner PRN  FRAZIER,NATALIE 05/08/2012, 5:57 PM

## 2012-05-08 NOTE — Progress Notes (Signed)
Questions about induction.

## 2012-05-08 NOTE — Progress Notes (Signed)
NST reactive; answered questioned regarding induction methods.  Glucose regulated by pump.

## 2012-05-08 NOTE — MAU Note (Signed)
Swollen feet and hands for past wk.  bp in office today was 135/95.  Sugars have been fine, just has been feeling well.  Pain in low abd-not cramping- just a constant ache.

## 2012-05-10 NOTE — MAU Provider Note (Signed)
Attestation of Attending Supervision of Advanced Practitioner (CNM/NP): Evaluation and management procedures were performed by the Advanced Practitioner under my supervision and collaboration.  I have reviewed the Advanced Practitioner's note and chart, and I agree with the management and plan.  Jamill Wetmore 05/10/2012 3:55 PM

## 2012-05-11 ENCOUNTER — Other Ambulatory Visit: Payer: Medicaid Other

## 2012-05-12 ENCOUNTER — Ambulatory Visit (INDEPENDENT_AMBULATORY_CARE_PROVIDER_SITE_OTHER): Payer: Medicaid Other | Admitting: *Deleted

## 2012-05-12 VITALS — BP 137/91

## 2012-05-12 DIAGNOSIS — O24913 Unspecified diabetes mellitus in pregnancy, third trimester: Secondary | ICD-10-CM

## 2012-05-12 DIAGNOSIS — O24919 Unspecified diabetes mellitus in pregnancy, unspecified trimester: Secondary | ICD-10-CM

## 2012-05-12 NOTE — Progress Notes (Signed)
P-89 

## 2012-05-12 NOTE — Progress Notes (Signed)
NST 05/12/12/ reactive  

## 2012-05-15 ENCOUNTER — Telehealth: Payer: Self-pay | Admitting: *Deleted

## 2012-05-15 ENCOUNTER — Ambulatory Visit (INDEPENDENT_AMBULATORY_CARE_PROVIDER_SITE_OTHER): Payer: Medicaid Other | Admitting: Obstetrics & Gynecology

## 2012-05-15 VITALS — BP 136/88 | Wt 237.3 lb

## 2012-05-15 DIAGNOSIS — O24919 Unspecified diabetes mellitus in pregnancy, unspecified trimester: Secondary | ICD-10-CM

## 2012-05-15 DIAGNOSIS — O24913 Unspecified diabetes mellitus in pregnancy, third trimester: Secondary | ICD-10-CM

## 2012-05-15 LAB — POCT URINALYSIS DIP (DEVICE)
Glucose, UA: NEGATIVE mg/dL
Hgb urine dipstick: NEGATIVE
Nitrite: NEGATIVE
Protein, ur: NEGATIVE mg/dL
Specific Gravity, Urine: 1.015 (ref 1.005–1.030)
Urobilinogen, UA: 0.2 mg/dL (ref 0.0–1.0)
pH: 6.5 (ref 5.0–8.0)

## 2012-05-15 NOTE — Progress Notes (Signed)
FBS 80-95, pp 136-140, on insulin pump  NST reactive  GCand CT done today

## 2012-05-15 NOTE — Patient Instructions (Signed)
Pregnancy - Third Trimester  The third trimester of pregnancy (the last 3 months) is a period of the most rapid growth for you and your baby. The baby approaches a length of 20 inches and a weight of 6 to 10 pounds. The baby is adding on fat and getting ready for life outside your body. While inside, babies have periods of sleeping and waking, suck their thumbs, and hiccups. You can often feel small contractions of the uterus. This is false labor. It is also called Braxton-Hicks contractions. This is like a practice for labor. The usual problems in this stage of pregnancy include more difficulty breathing, swelling of the hands and feet from water retention, and having to urinate more often because of the uterus and baby pressing on your bladder.   PRENATAL EXAMS  · Blood work may continue to be done during prenatal exams. These tests are done to check on your health and the probable health of your baby. Blood work is used to follow your blood levels (hemoglobin). Anemia (low hemoglobin) is common during pregnancy. Iron and vitamins are given to help prevent this. You may also continue to be checked for diabetes. Some of the past blood tests may be done again.  · The size of the uterus is measured during each visit. This makes sure your baby is growing properly according to your pregnancy dates.  · Your blood pressure is checked every prenatal visit. This is to make sure you are not getting toxemia.  · Your urine is checked every prenatal visit for infection, diabetes and protein.  · Your weight is checked at each visit. This is done to make sure gains are happening at the suggested rate and that you and your baby are growing normally.  · Sometimes, an ultrasound is performed to confirm the position and the proper growth and development of the baby. This is a test done that bounces harmless sound waves off the baby so your caregiver can more accurately determine due dates.  · Discuss the type of pain medication and  anesthesia you will have during your labor and delivery.  · Discuss the possibility and anesthesia if a Cesarean Section might be necessary.  · Inform your caregiver if there is any mental or physical violence at home.  Sometimes, a specialized non-stress test, contraction stress test and biophysical profile are done to make sure the baby is not having a problem. Checking the amniotic fluid surrounding the baby is called an amniocentesis. The amniotic fluid is removed by sticking a needle into the belly (abdomen). This is sometimes done near the end of pregnancy if an early delivery is required. In this case, it is done to help make sure the baby's lungs are mature enough for the baby to live outside of the womb. If the lungs are not mature and it is unsafe to deliver the baby, an injection of cortisone medication is given to the mother 1 to 2 days before the delivery. This helps the baby's lungs mature and makes it safer to deliver the baby.  CHANGES OCCURING IN THE THIRD TRIMESTER OF PREGNANCY  Your body goes through many changes during pregnancy. They vary from person to person. Talk to your caregiver about changes you notice and are concerned about.  · During the last trimester, you have probably had an increase in your appetite. It is normal to have cravings for certain foods. This varies from person to person and pregnancy to pregnancy.  · You may begin to   get stretch marks on your hips, abdomen, and breasts. These are normal changes in the body during pregnancy. There are no exercises or medications to take which prevent this change.  · Constipation may be treated with a stool softener or adding bulk to your diet. Drinking lots of fluids, fiber in vegetables, fruits, and whole grains are helpful.  · Exercising is also helpful. If you have been very active up until your pregnancy, most of these activities can be continued during your pregnancy. If you have been less active, it is helpful to start an exercise  program such as walking. Consult your caregiver before starting exercise programs.  · Avoid all smoking, alcohol, un-prescribed drugs, herbs and "street drugs" during your pregnancy. These chemicals affect the formation and growth of the baby. Avoid chemicals throughout the pregnancy to ensure the delivery of a healthy infant.  · Backache, varicose veins and hemorrhoids may develop or get worse.  · You will tire more easily in the third trimester, which is normal.  · The baby's movements may be stronger and more often.  · You may become short of breath easily.  · Your belly button may stick out.  · A yellow discharge may leak from your breasts called colostrum.  · You may have a bloody mucus discharge. This usually occurs a few days to a week before labor begins.  HOME CARE INSTRUCTIONS   · Keep your caregiver's appointments. Follow your caregiver's instructions regarding medication use, exercise, and diet.  · During pregnancy, you are providing food for you and your baby. Continue to eat regular, well-balanced meals. Choose foods such as meat, fish, milk and other low fat dairy products, vegetables, fruits, and whole-grain breads and cereals. Your caregiver will tell you of the ideal weight gain.  · A physical sexual relationship may be continued throughout pregnancy if there are no other problems such as early (premature) leaking of amniotic fluid from the membranes, vaginal bleeding, or belly (abdominal) pain.  · Exercise regularly if there are no restrictions. Check with your caregiver if you are unsure of the safety of your exercises. Greater weight gain will occur in the last 2 trimesters of pregnancy. Exercising helps:  · Control your weight.  · Get you in shape for labor and delivery.  · You lose weight after you deliver.  · Rest a lot with legs elevated, or as needed for leg cramps or low back pain.  · Wear a good support or jogging bra for breast tenderness during pregnancy. This may help if worn during  sleep. Pads or tissues may be used in the bra if you are leaking colostrum.  · Do not use hot tubs, steam rooms, or saunas.  · Wear your seat belt when driving. This protects you and your baby if you are in an accident.  · Avoid raw meat, cat litter boxes and soil used by cats. These carry germs that can cause birth defects in the baby.  · It is easier to loose urine during pregnancy. Tightening up and strengthening the pelvic muscles will help with this problem. You can practice stopping your urination while you are going to the bathroom. These are the same muscles you need to strengthen. It is also the muscles you would use if you were trying to stop from passing gas. You can practice tightening these muscles up 10 times a set and repeating this about 3 times per day. Once you know what muscles to tighten up, do not perform these   exercises during urination. It is more likely to cause an infection by backing up the urine.  · Ask for help if you have financial, counseling or nutritional needs during pregnancy. Your caregiver will be able to offer counseling for these needs as well as refer you for other special needs.  · Make a list of emergency phone numbers and have them available.  · Plan on getting help from family or friends when you go home from the hospital.  · Make a trial run to the hospital.  · Take prenatal classes with the father to understand, practice and ask questions about the labor and delivery.  · Prepare the baby's room/nursery.  · Do not travel out of the city unless it is absolutely necessary and with the advice of your caregiver.  · Wear only low or no heal shoes to have better balance and prevent falling.  MEDICATIONS AND DRUG USE IN PREGNANCY  · Take prenatal vitamins as directed. The vitamin should contain 1 milligram of folic acid. Keep all vitamins out of reach of children. Only a couple vitamins or tablets containing iron may be fatal to a baby or young child when ingested.  · Avoid use  of all medications, including herbs, over-the-counter medications, not prescribed or suggested by your caregiver. Only take over-the-counter or prescription medicines for pain, discomfort, or fever as directed by your caregiver. Do not use aspirin, ibuprofen (Motrin®, Advil®, Nuprin®) or naproxen (Aleve®) unless OK'd by your caregiver.  · Let your caregiver also know about herbs you may be using.  · Alcohol is related to a number of birth defects. This includes fetal alcohol syndrome. All alcohol, in any form, should be avoided completely. Smoking will cause low birth rate and premature babies.  · Street/illegal drugs are very harmful to the baby. They are absolutely forbidden. A baby born to an addicted mother will be addicted at birth. The baby will go through the same withdrawal an adult does.  SEEK MEDICAL CARE IF:  You have any concerns or worries during your pregnancy. It is better to call with your questions if you feel they cannot wait, rather than worry about them.  DECISIONS ABOUT CIRCUMCISION  You may or may not know the sex of your baby. If you know your baby is a boy, it may be time to think about circumcision. Circumcision is the removal of the foreskin of the penis. This is the skin that covers the sensitive end of the penis. There is no proven medical need for this. Often this decision is made on what is popular at the time or based upon religious beliefs and social issues. You can discuss these issues with your caregiver or pediatrician.  SEEK IMMEDIATE MEDICAL CARE IF:   · An unexplained oral temperature above 102° F (38.9° C) develops, or as your caregiver suggests.  · You have leaking of fluid from the vagina (birth canal). If leaking membranes are suspected, take your temperature and tell your caregiver of this when you call.  · There is vaginal spotting, bleeding or passing clots. Tell your caregiver of the amount and how many pads are used.  · You develop a bad smelling vaginal discharge with  a change in the color from clear to white.  · You develop vomiting that lasts more than 24 hours.  · You develop chills or fever.  · You develop shortness of breath.  · You develop burning on urination.  · You loose more than 2 pounds of weight   or gain more than 2 pounds of weight or as suggested by your caregiver.  · You notice sudden swelling of your face, hands, and feet or legs.  · You develop belly (abdominal) pain. Round ligament discomfort is a common non-cancerous (benign) cause of abdominal pain in pregnancy. Your caregiver still must evaluate you.  · You develop a severe headache that does not go away.  · You develop visual problems, blurred or double vision.  · If you have not felt your baby move for more than 1 hour. If you think the baby is not moving as much as usual, eat something with sugar in it and lie down on your left side for an hour. The baby should move at least 4 to 5 times per hour. Call right away if your baby moves less than that.  · You fall, are in a car accident or any kind of trauma.  · There is mental or physical violence at home.  Document Released: 12/22/2000 Document Revised: 03/22/2011 Document Reviewed: 06/26/2008  Exit[employer]® Patient Information ©2013 [employer].

## 2012-05-15 NOTE — Progress Notes (Signed)
Pulse: 86

## 2012-05-15 NOTE — Progress Notes (Signed)
[place] for growth scheduled @ [facility] on 05/25/12.

## 2012-05-15 NOTE — Telephone Encounter (Signed)
  Pump Follow Up Progress Note  Patient texted me that she had uploaded her pump to [facility] on 05/14/2012. I reviewed these reports this AM.  Orders received from MD giving me permission to make insulin pump adjustments for the following patient. Karina, [name] Female, 27 y.o., 1985-03-23  Reviewed blood glucose logs on  05/04/2012 via: [facility]  and found the following:            Hypoglycemia Hyperglycemia Comments  Overnight Period:  YES  once  Pre-Meal:    Breakfast      Lunch      Supper     Post-Meal: Breakfast      Lunch      Supper     Bedtime:       Comments: Average BG for past week is now 104 mg/dl. One incidence of hypoglycemia in past week.  Pump Settings:Changes made are in bold print under date of visit. Date: Current Date: 5/5//2014 - no changes today   Basal Rate: Carb Ratio Sensitivity  Basal Rate: Carb Ratio Sensitivity   MN: 2.15 MN: 5.5 20 MN:     2 PM 2.40 11 AM: 6.5  2 PM                                                   Plan: patient to continue with use of Bolus Wizard and contact me if BG go below 60 or above 140 md/dl.  Follow up:  Patient to upload to [facility] within 7 days for further review

## 2012-05-16 LAB — GC/CHLAMYDIA PROBE AMP: GC Probe RNA: NEGATIVE

## 2012-05-18 ENCOUNTER — Ambulatory Visit (INDEPENDENT_AMBULATORY_CARE_PROVIDER_SITE_OTHER): Payer: Medicaid Other | Admitting: *Deleted

## 2012-05-18 DIAGNOSIS — O24919 Unspecified diabetes mellitus in pregnancy, unspecified trimester: Secondary | ICD-10-CM

## 2012-05-18 DIAGNOSIS — O24913 Unspecified diabetes mellitus in pregnancy, third trimester: Secondary | ICD-10-CM

## 2012-05-22 ENCOUNTER — Other Ambulatory Visit: Payer: Self-pay | Admitting: Obstetrics and Gynecology

## 2012-05-22 ENCOUNTER — Ambulatory Visit (INDEPENDENT_AMBULATORY_CARE_PROVIDER_SITE_OTHER): Payer: Medicaid Other | Admitting: Obstetrics & Gynecology

## 2012-05-22 VITALS — BP 140/80 | Wt 239.2 lb

## 2012-05-22 DIAGNOSIS — O24919 Unspecified diabetes mellitus in pregnancy, unspecified trimester: Secondary | ICD-10-CM

## 2012-05-22 DIAGNOSIS — O24913 Unspecified diabetes mellitus in pregnancy, third trimester: Secondary | ICD-10-CM

## 2012-05-22 LAB — GLUCOSE, CAPILLARY

## 2012-05-22 NOTE — Progress Notes (Signed)
NST reviewed and reactive.  [facility] Edwin L. Harraway-Smith, M.D., FACOG    

## 2012-05-22 NOTE — Progress Notes (Signed)
P = 98         [place] for growth and NST @ [facility] on 5/15.  Pt lost meter and has not checked CBG since 8:45pm yesterday.  She will be getting a loaner meter today from [employer].  1 hr PP CBG checked = 227.  Pt states she had juice at lunch because thought her CBG might be low and could not check.  No problems with insulin pump.

## 2012-05-22 NOTE — Patient Instructions (Signed)
Normal Labor and Delivery  Your caregiver must first be sure you are in labor. Signs of labor include:  · You may pass what is called "the mucus plug" before labor begins. This is a small amount of blood stained mucus.  · Regular uterine contractions.  · The time between contractions get closer together.  · The discomfort and pain gradually gets more intense.  · Pains are mostly located in the back.  · Pains get worse when walking.  · The cervix (the opening of the uterus becomes thinner (begins to efface) and opens up (dilates).  Once you are in labor and admitted into the hospital or care center, your caregiver will do the following:  · A complete physical examination.  · Check your vital signs (blood pressure, pulse, temperature and the fetal heart rate).  · Do a vaginal examination (using a sterile glove and lubricant) to determine:  · The position (presentation) of the baby (head [vertex] or buttock first).  · The level (station) of the baby's head in the birth canal.  · The effacement and dilatation of the cervix.  · You may have your pubic hair shaved and be given an enema depending on your caregiver and the circumstance.  · An electronic monitor is usually placed on your abdomen. The monitor follows the length and intensity of the contractions, as well as the baby's heart rate.  · Usually, your caregiver will insert an IV in your arm with a bottle of sugar water. This is done as a precaution so that medications can be given to you quickly during labor or delivery.  NORMAL LABOR AND DELIVERY IS DIVIDED UP INTO 3 STAGES:  First Stage  This is when regular contractions begin and the cervix begins to efface and dilate. This stage can last from 3 to 15 hours. The end of the first stage is when the cervix is 100% effaced and 10 centimeters dilated. Pain medications may be given by   · Injection (morphine, demerol, etc.)  · Regional anesthesia (spinal, caudal or epidural, anesthetics given in different locations of  the spine). Paracervical pain medication may be given, which is an injection of and anesthetic on each side of the cervix.  A pregnant woman may request to have "Natural Childbirth" which is not to have any medications or anesthesia during her labor and delivery.  Second Stage  This is when the baby comes down through the birth canal (vagina) and is born. This can take 1 to 4 hours. As the baby's head comes down through the birth canal, you may feel like you are going to have a bowel movement. You will get the urge to bear down and push until the baby is delivered. As the baby's head is being delivered, the caregiver will decide if an episiotomy (a cut in the perineum and vagina area) is needed to prevent tearing of the tissue in this area. The episiotomy is sewn up after the delivery of the baby and placenta. Sometimes a mask with nitrous oxide is given for the mother to breath during the delivery of the baby to help if there is too much pain. The end of Stage 2 is when the baby is fully delivered. Then when the umbilical cord stops pulsating it is clamped and cut.  Third Stage  The third stage begins after the baby is completely delivered and ends after the placenta (afterbirth) is delivered. This usually takes 5 to 30 minutes. After the placenta is delivered, a medication   is given either by intravenous or injection to help contract the uterus and prevent bleeding. The third stage is not painful and pain medication is usually not necessary. If an episiotomy was done, it is repaired at this time.  After the delivery, the mother is watched and monitored closely for 1 to 2 hours to make sure there is no postpartum bleeding (hemorrhage). If there is a lot of bleeding, medication is given to contract the uterus and stop the bleeding.  Document Released: 10/07/2007 Document Revised: 03/22/2011 Document Reviewed: 10/07/2007  ExitCare® Patient Information ©2013 [employer].

## 2012-05-22 NOTE — Progress Notes (Signed)
Nsg note reviewed. Wiil get a new meter. NST reviewed, reactive. Scheduled induction 5/24 39 weeks

## 2012-05-23 ENCOUNTER — Telehealth (HOSPITAL_COMMUNITY): Payer: Self-pay | Admitting: *Deleted

## 2012-05-23 NOTE — Telephone Encounter (Signed)
Preadmission screen  

## 2012-05-24 ENCOUNTER — Telehealth: Payer: Self-pay | Admitting: *Deleted

## 2012-05-24 NOTE — Telephone Encounter (Signed)
  Pump Follow Up Progress Note  Orders received from MD giving me permission to make insulin pump adjustments for the following patient. Karina, [name] Female, 27 y.o., [birth year]  Reviewed blood glucose logs on 05/24/2012 via: Log Sheets and found the following:            Hypoglycemia Hyperglycemia Comments  Overnight Period:      Pre-Meal:    Breakfast      Lunch      Supper     Post-Meal: Breakfast  YES Carb Ratio   Lunch  YES Carb Ratio   Supper  YES Carb Ratio  Bedtime:   YES Sensitivity Factor   Comments: Fasting BG's looking better. Post meal BG's too high and Correction insulin is not bringing her down to Target Range adequately. Plan to increase Carb Ration and Sensitivity Factor each by 10%   Pump Settings:changes made will be in BOLD PRINT under date that change was made Date: Current Date: 05/24/2012   Basal Rate: Carb Ratio Sensitivity  Basal Rate: Carb Ratio Sensitivity   MN: 2.15 MN: 5.5 20 MN:  MN: 5.0 (+) 17 (+)  2 PM 2.40 11 AM: 6.5    11 AM: 6.0 (+)                                                 Plan: patient to continue using pump and Bolus Wizard as planned. She states that her OB MD will have her remove her pump for delivery and use Insulin Glucomander. IN the meantime, she is to call me if BG drops below 60 or goes above 180 mg/dl.  Follow up:  Patient to upload to [facility] by Monday, May 29, 2012 for further review

## 2012-05-25 ENCOUNTER — Ambulatory Visit (HOSPITAL_COMMUNITY)
Admission: RE | Admit: 2012-05-25 | Discharge: 2012-05-25 | Disposition: A | Discharge status: Home / Self Care | Payer: Medicaid Other | Source: Ambulatory Visit | Attending: Obstetrics and Gynecology | Admitting: Obstetrics and Gynecology

## 2012-05-25 ENCOUNTER — Other Ambulatory Visit (HOSPITAL_COMMUNITY): Payer: Self-pay | Admitting: Maternal and Fetal Medicine

## 2012-05-25 VITALS — BP 141/83 | HR 95 | Wt 243.0 lb

## 2012-05-25 DIAGNOSIS — Z283 Underimmunization status: Secondary | ICD-10-CM

## 2012-05-25 DIAGNOSIS — O24913 Unspecified diabetes mellitus in pregnancy, third trimester: Secondary | ICD-10-CM

## 2012-05-25 DIAGNOSIS — Z2839 Other underimmunization status: Secondary | ICD-10-CM

## 2012-05-25 DIAGNOSIS — O0993 Supervision of high risk pregnancy, unspecified, third trimester: Secondary | ICD-10-CM

## 2012-05-25 DIAGNOSIS — O24919 Unspecified diabetes mellitus in pregnancy, unspecified trimester: Secondary | ICD-10-CM

## 2012-05-25 DIAGNOSIS — B951 Streptococcus, group B, as the cause of diseases classified elsewhere: Secondary | ICD-10-CM

## 2012-05-25 DIAGNOSIS — Z3689 Encounter for other specified antenatal screening: Secondary | ICD-10-CM | POA: Insufficient documentation

## 2012-05-25 NOTE — Progress Notes (Signed)
Karina [name]  was seen today for an ultrasound appointment.  See full report in [facility].  Impression: Single IUP at 37 5/7 weeks Normal interval anatomy Fetal growth is appropriate (73rd %tile) Normal amniotic fluid volume  Recommendations: Recommend continued antenatal testing (2x weekly NSTs with weekly AFI. Recommend delivery by estimated due date but not prior to 39 weeks in the absence of other complications and in the setting of good glucose control Follow-up ultrasounds as clinically indicated.   Alpha Gula, MD

## 2012-05-29 ENCOUNTER — Ambulatory Visit (INDEPENDENT_AMBULATORY_CARE_PROVIDER_SITE_OTHER): Payer: Medicaid Other | Admitting: Family Medicine

## 2012-05-29 ENCOUNTER — Encounter: Payer: Self-pay | Admitting: Family Medicine

## 2012-05-29 VITALS — BP 152/96 | Temp 97.3°F | Wt 243.8 lb

## 2012-05-29 DIAGNOSIS — O24913 Unspecified diabetes mellitus in pregnancy, third trimester: Secondary | ICD-10-CM

## 2012-05-29 DIAGNOSIS — O139 Gestational [pregnancy-induced] hypertension without significant proteinuria, unspecified trimester: Secondary | ICD-10-CM

## 2012-05-29 DIAGNOSIS — O24919 Unspecified diabetes mellitus in pregnancy, unspecified trimester: Secondary | ICD-10-CM

## 2012-05-29 DIAGNOSIS — O133 Gestational [pregnancy-induced] hypertension without significant proteinuria, third trimester: Secondary | ICD-10-CM | POA: Insufficient documentation

## 2012-05-29 LAB — COMPREHENSIVE METABOLIC PANEL
ALT: 19 U/L (ref 0–35)
Albumin: 3.5 g/dL (ref 3.5–5.2)
CO2: 24 mEq/L (ref 19–32)
Glucose, Bld: 69 mg/dL — ABNORMAL LOW (ref 70–99)
Potassium: 3.8 mEq/L (ref 3.5–5.3)
Sodium: 135 mEq/L (ref 135–145)
Total Bilirubin: 0.4 mg/dL (ref 0.3–1.2)
Total Protein: 6.5 g/dL (ref 6.0–8.3)

## 2012-05-29 LAB — CBC
Hemoglobin: 12 g/dL (ref 12.0–15.0)
MCH: 26.8 pg (ref 26.0–34.0)
MCHC: 33.6 g/dL (ref 30.0–36.0)
MCV: 79.9 fL (ref 78.0–100.0)
Platelets: 210 10*3/uL (ref 150–400)
RBC: 4.47 MIL/uL (ref 3.87–5.11)

## 2012-05-29 LAB — POCT URINALYSIS DIP (DEVICE)
Bilirubin Urine: NEGATIVE
Glucose, UA: NEGATIVE mg/dL
Ketones, ur: NEGATIVE mg/dL
Specific Gravity, Urine: 1.01 (ref 1.005–1.030)

## 2012-05-29 NOTE — Progress Notes (Signed)
Pt reports having H/A's since yesterday- she took tylenol w/minimal relief.  [place] growth done on 5/15.  IOL scheduled on 5/24

## 2012-05-29 NOTE — Patient Instructions (Addendum)
Preeclampsia and Eclampsia Preeclampsia is a condition of high blood pressure during pregnancy. It can happen at 20 weeks or later in pregnancy. If high blood pressure occurs in the second half of pregnancy with no other symptoms, it is called gestational hypertension and goes away after the baby is born. If any of the symptoms listed below develop with gestational hypertension, it is then called preeclampsia. Eclampsia (convulsions) may follow preeclampsia. This is one of the reasons for regular prenatal checkups. Early diagnosis and treatment are very important to prevent eclampsia. CAUSES  There is no known cause of preeclampsia/eclampsia in pregnancy. There are several known conditions that may put the pregnant woman at risk, such as:  The first pregnancy.  Having preeclampsia in a past pregnancy.  Having lasting (chronic) high blood pressure.  Having multiples (twins, triplets).  Being age 35 or older.  African American ethnic background.  Having kidney disease or diabetes.  Medical conditions such as lupus or blood diseases.  Being overweight (obese). SYMPTOMS   High blood pressure.  Headaches.  Sudden weight gain.  Swelling of hands, face, legs, and feet.  Protein in the urine.  Feeling sick to your stomach (nauseous) and throwing up (vomiting).  Vision problems (blurred or double vision).  Numbness in the face, arms, legs, and feet.  Dizziness.  Slurred speech.  Preeclampsia can cause growth retardation in the fetus.  Separation (abruption) of the placenta.  Not enough fluid in the amniotic sac (oligohydramnios).  Sensitivity to bright lights.  Belly (abdominal) pain. DIAGNOSIS  If protein is found in the urine in the second half of pregnancy, this is considered preeclampsia. Other symptoms mentioned above may also be present. TREATMENT  It is necessary to treat this.  Your caregiver may prescribe bed rest early in this condition. Plenty of rest and  salt restriction may be all that is needed.  Medicines may be necessary to lower blood pressure if the condition does not respond to more conservative measures.  In more severe cases, hospitalization may be needed:  For treatment of blood pressure.  To control fluid retention.  To monitor the baby to see if the condition is causing harm to the baby.  Hospitalization is the best way to treat the first sign of preeclampsia. This is so the mother and baby can be watched closely and blood tests can be done effectively and correctly.  If the condition becomes severe, it may be necessary to induce labor or to remove the infant by surgical means (cesarean section). The best cure for preeclampsia/eclampsia is to deliver the baby. Preeclampsia and eclampsia involve risks to mother and infant. Your caregiver will discuss these risks with you. Together, you can work out the best possible approach to your problems. Make sure you keep your prenatal visits as scheduled. Not keeping appointments could result in a chronic or permanent injury, pain, disability to you, and death or injury to you or your unborn baby. If there is any problem keeping the appointment, you must call to reschedule. HOME CARE INSTRUCTIONS   Keep your prenatal appointments and tests as scheduled.  Tell your caregiver if you have any of the above risk factors.  Get plenty of rest and sleep.  Eat a balanced diet that is low in salt, and do not add salt to your food.  Avoid stressful situations.  Only take over-the-counter and prescriptions medicines for pain, discomfort, or fever as directed by your caregiver. SEEK IMMEDIATE MEDICAL CARE IF:   You develop severe swelling   anywhere in the body. This usually occurs in the legs.  You gain 5 lb/2.3 kg or more in a week.  You develop a severe headache, dizziness, problems with your vision, or confusion.  You have abdominal pain, nausea, or vomiting.  You have a seizure.  You  have trouble moving any part of your body, or you develop numbness or problems speaking.  You have bruising or abnormal bleeding from anywhere in the body.  You develop a stiff neck.  You pass out. MAKE SURE YOU:   Understand these instructions.  Will watch your condition.  Will get help right away if you are not doing well or get worse. Document Released: 12/26/1999 Document Revised: 03/22/2011 Document Reviewed: 08/11/2007 Exit[employer] Patient Information 2013 [employer].  Breastfeeding Deciding to breastfeed is one of the best choices you can make for you and your baby. The information that follows gives a brief overview of the benefits of breastfeeding as well as common topics surrounding breastfeeding. BENEFITS OF BREASTFEEDING For the baby  The first milk (colostrum) helps the baby's digestive system function better.   There are antibodies in the mother's milk that help the baby fight off infections.   The baby has a lower incidence of asthma, allergies, and sudden infant death syndrome (SIDS).   The nutrients in breast milk are better for the baby than infant formulas, and breast milk helps the baby's brain grow better.   Babies who breastfeed have less gas, colic, and constipation.  For the mother  Breastfeeding helps develop a very special bond between the mother and her baby.   Breastfeeding is convenient, always available at the correct temperature, and costs nothing.   Breastfeeding burns calories in the mother and helps her lose weight that was gained during pregnancy.   Breastfeeding makes the uterus contract back down to normal size faster and slows bleeding following delivery.   Breastfeeding mothers have a lower risk of developing breast cancer.  BREASTFEEDING FREQUENCY  A healthy, full-term baby may breastfeed as often as every hour or space his or her feedings to every 3 hours.   Watch your baby for signs of hunger. Nurse your baby if he or  she shows signs of hunger. How often you nurse will vary from baby to baby.   Nurse as often as the baby requests, or when you feel the need to reduce the fullness of your breasts.   Awaken the baby if it has been 3 4 hours since the last feeding.   Frequent feeding will help the mother make more milk and will help prevent problems, such as sore nipples and engorgement of the breasts.  BABY'S POSITION AT THE BREAST  Whether lying down or sitting, be sure that the baby's tummy is facing your tummy.   Support the breast with 4 fingers underneath the breast and the thumb above. Make sure your fingers are well away from the nipple and baby's mouth.   Stroke the baby's lips gently with your finger or nipple.   When the baby's mouth is open wide enough, place all of your nipple and as much of the areola as possible into your baby's mouth.   Pull the baby in close so the tip of the nose and the baby's cheeks touch the breast during the feeding.  FEEDINGS AND SUCTION  The length of each feeding varies from baby to baby and from feeding to feeding.   The baby must suck about 2 3 minutes for your milk to   get to him or her. This is called a "let down." For this reason, allow the baby to feed on each breast as long as he or she wants. Your baby will end the feeding when he or she has received the right balance of nutrients.   To break the suction, put your finger into the corner of the baby's mouth and slide it between his or her gums before removing your breast from his or her mouth. This will help prevent sore nipples.  HOW TO TELL WHETHER YOUR BABY IS GETTING ENOUGH BREAST MILK. Wondering whether or not your baby is getting enough milk is a common concern among mothers. You can be assured that your baby is getting enough milk if:   Your baby is actively sucking and you hear swallowing.   Your baby seems relaxed and satisfied after a feeding.   Your baby nurses at least 8 12 times  in a 24 hour time period. Nurse your baby until he or she unlatches or falls asleep at the first breast (at least 10 20 minutes), then offer the second side.   Your baby is wetting 5 6 disposable diapers (6 8 cloth diapers) in a 24 hour period by 5 6 days of age.   Your baby is having at least 3 4 stools every 24 hours for the first 6 weeks. The stool should be soft and yellow.   Your baby should gain 4 7 ounces per week after he or she is 4 days old.   Your breasts feel softer after nursing.  REDUCING BREAST ENGORGEMENT  In the first week after your baby is born, you may experience signs of breast engorgement. When breasts are engorged, they feel heavy, warm, full, and may be tender to the touch. You can reduce engorgement if you:   Nurse frequently, every 2 3 hours. Mothers who breastfeed early and often have fewer problems with engorgement.   Place light ice packs on your breasts for 10 20 minutes between feedings. This reduces swelling. Wrap the ice packs in a lightweight towel to protect your skin. Bags of frozen vegetables work well for this purpose.   Take a warm shower or apply warm, moist heat to your breast for 5 10 minutes just before each feeding. This increases circulation and helps the milk flow.   Gently massage your breast before and during the feeding. Using your finger tips, massage from the chest wall towards your nipple in a circular motion.   Make sure that the baby empties at least one breast at every feeding before switching sides.   Use a breast pump to empty the breasts if your baby is sleepy or not nursing well. You may also want to pump if you are returning to work oryou feel you are getting engorged.   Avoid bottle feeds, pacifiers, or supplemental feedings of water or juice in place of breastfeeding. Breast milk is all the food your baby needs. It is not necessary for your baby to have water or formula. In fact, to help your breasts make more milk,  it is best not to give your baby supplemental feedings during the early weeks.   Be sure the baby is latched on and positioned properly while breastfeeding.   Wear a supportive bra, avoiding underwire styles.   Eat a balanced diet with enough fluids.   Rest often, relax, and take your prenatal vitamins to prevent fatigue, stress, and anemia.  If you follow these suggestions, your engorgement should improve   in 24 48 hours. If you are still experiencing difficulty, call your lactation consultant or caregiver.  CARING FOR YOURSELF Take care of your breasts  Bathe or shower daily.   Avoid using soap on your nipples.   Start feedings on your left breast at one feeding and on your right breast at the next feeding.   You will notice an increase in your milk supply 2 5 days after delivery. You may feel some discomfort from engorgement, which makes your breasts very firm and often tender. Engorgement "peaks" out within 24 48 hours. In the meantime, apply warm moist towels to your breasts for 5 10 minutes before feeding. Gentle massage and expression of some milk before feeding will soften your breasts, making it easier for your baby to latch on.   Wear a well-fitting nursing bra, and air dry your nipples for a 3 4minutes after each feeding.   Only use cotton bra pads.   Only use pure lanolin on your nipples after nursing. You do not need to wash it off before feeding the baby again. Another option is to express a few drops of breast milk and gently massage it into your nipples.  Take care of yourself  Eat well-balanced meals and nutritious snacks.   Drinking milk, fruit juice, and water to satisfy your thirst (about 8 glasses a day).   Get plenty of rest.  Avoid foods that you notice affect the baby in a bad way.  SEEK MEDICAL CARE IF:   You have difficulty with breastfeeding and need help.   You have a hard, red, sore area on your breast that is accompanied by a fever.    Your baby is too sleepy to eat well or is having trouble sleeping.   Your baby is wetting less than 6 diapers a day, by 5 days of age.   Your baby's skin or white part of his or her eyes is more yellow than it was in the hospital.   You feel depressed.  Document Released: 12/28/2004 Document Revised: 06/29/2011 Document Reviewed: 03/28/2011 [employer] Patient Information 2013 [employer].  

## 2012-05-29 NOTE — Progress Notes (Signed)
No BS today--BP up x 2--has insulin pump uploads to [facility] FBS 70-88 2 hour pp 119-130 U/S for growth on 05/15 showed 7 lb 3 oz. BP up-no proteinuria--check labs. For IOL at 39 wks.

## 2012-05-29 NOTE — Progress Notes (Signed)
Pulse- 82 Patient reports abdominal & back pain, pelvic pressure and some headaches

## 2012-05-30 ENCOUNTER — Telehealth: Payer: Self-pay | Admitting: *Deleted

## 2012-05-30 ENCOUNTER — Inpatient Hospital Stay (HOSPITAL_COMMUNITY)
Admission: AD | Admit: 2012-05-30 | Discharge: 2012-06-05 | DRG: 765 | Disposition: A | Discharge status: Home / Self Care | Payer: Medicaid Other | Source: Ambulatory Visit | Attending: Obstetrics and Gynecology | Admitting: Obstetrics and Gynecology

## 2012-05-30 ENCOUNTER — Ambulatory Visit (INDEPENDENT_AMBULATORY_CARE_PROVIDER_SITE_OTHER): Payer: Medicaid Other | Admitting: *Deleted

## 2012-05-30 ENCOUNTER — Encounter (HOSPITAL_COMMUNITY): Payer: Self-pay | Admitting: Advanced Practice Midwife

## 2012-05-30 VITALS — BP 138/93 | HR 90

## 2012-05-30 DIAGNOSIS — O133 Gestational [pregnancy-induced] hypertension without significant proteinuria, third trimester: Secondary | ICD-10-CM

## 2012-05-30 DIAGNOSIS — E119 Type 2 diabetes mellitus without complications: Secondary | ICD-10-CM | POA: Diagnosis present

## 2012-05-30 DIAGNOSIS — O2432 Unspecified pre-existing diabetes mellitus in childbirth: Secondary | ICD-10-CM | POA: Diagnosis present

## 2012-05-30 DIAGNOSIS — O24919 Unspecified diabetes mellitus in pregnancy, unspecified trimester: Secondary | ICD-10-CM

## 2012-05-30 DIAGNOSIS — O99892 Other specified diseases and conditions complicating childbirth: Secondary | ICD-10-CM | POA: Diagnosis present

## 2012-05-30 DIAGNOSIS — O1002 Pre-existing essential hypertension complicating childbirth: Principal | ICD-10-CM | POA: Diagnosis present

## 2012-05-30 DIAGNOSIS — O139 Gestational [pregnancy-induced] hypertension without significant proteinuria, unspecified trimester: Secondary | ICD-10-CM

## 2012-05-30 DIAGNOSIS — L299 Pruritus, unspecified: Secondary | ICD-10-CM | POA: Diagnosis present

## 2012-05-30 DIAGNOSIS — Z2839 Other underimmunization status: Secondary | ICD-10-CM

## 2012-05-30 DIAGNOSIS — O26899 Other specified pregnancy related conditions, unspecified trimester: Secondary | ICD-10-CM | POA: Diagnosis present

## 2012-05-30 DIAGNOSIS — Z794 Long term (current) use of insulin: Secondary | ICD-10-CM

## 2012-05-30 DIAGNOSIS — O324XX0 Maternal care for high head at term, not applicable or unspecified: Secondary | ICD-10-CM | POA: Diagnosis present

## 2012-05-30 DIAGNOSIS — Z283 Underimmunization status: Secondary | ICD-10-CM

## 2012-05-30 DIAGNOSIS — Z22330 Carrier of Group B streptococcus: Secondary | ICD-10-CM

## 2012-05-30 DIAGNOSIS — O24913 Unspecified diabetes mellitus in pregnancy, third trimester: Secondary | ICD-10-CM

## 2012-05-30 LAB — CBC
Platelets: 196 10*3/uL (ref 150–400)
RBC: 4.3 MIL/uL (ref 3.87–5.11)
WBC: 6.8 10*3/uL (ref 4.0–10.5)

## 2012-05-30 LAB — COMPREHENSIVE METABOLIC PANEL
ALT: 22 U/L (ref 0–35)
BUN: 8 mg/dL (ref 6–23)
Calcium: 9.1 mg/dL (ref 8.4–10.5)
Chloride: 104 mEq/L (ref 96–112)
Creatinine, Ser: 0.91 mg/dL (ref 0.50–1.10)
GFR calc Af Amer: >90 mL/min (ref >90)
GFR calc non Af Amer: 85 mL/min — ABNORMAL LOW (ref >90)
Potassium: 3.6 mEq/L (ref 3.5–5.1)
Total Protein: 6.6 g/dL (ref 6.0–8.3)

## 2012-05-30 LAB — TYPE AND SCREEN: ABO/RH(D): A POS

## 2012-05-30 LAB — GLUCOSE, CAPILLARY

## 2012-05-30 MED ORDER — TERBUTALINE SULFATE 1 MG/ML IJ SOLN
0.2500 mg | Freq: Once | INTRAMUSCULAR | Status: AC | PRN
  Filled 2012-05-30: qty 1

## 2012-05-30 MED ORDER — ZOLPIDEM TARTRATE 5 MG PO TABS: 5.0000 mg | ORAL_TABLET | Freq: Every evening | ORAL | Status: DC | PRN

## 2012-05-30 MED ORDER — MISOPROSTOL 25 MCG QUARTER TABLET
25.0000 ug | ORAL_TABLET | ORAL | Status: DC | PRN
  Administered 2012-05-30 (×2): 25 ug via VAGINAL
  Filled 2012-05-30 (×2): qty 0.25

## 2012-05-30 MED ORDER — CITRIC ACID-SODIUM CITRATE 334-500 MG/5ML PO SOLN
30.0000 mL | ORAL | Status: DC | PRN
  Administered 2012-05-31 – 2012-06-02 (×3): 30 mL via ORAL
  Filled 2012-05-30 (×3): qty 15

## 2012-05-30 MED ORDER — LIDOCAINE HCL (PF) 1 % IJ SOLN: 30.0000 mL | INTRAMUSCULAR | Status: DC | PRN

## 2012-05-30 MED ORDER — LACTATED RINGERS IV SOLN
INTRAVENOUS | Status: DC
  Administered 2012-05-31 – 2012-06-02 (×7): via INTRAVENOUS

## 2012-05-30 MED ORDER — OXYTOCIN BOLUS FROM INFUSION: 500.0000 mL | INTRAVENOUS | Status: DC

## 2012-05-30 MED ORDER — LACTATED RINGERS IV SOLN
500.0000 mL | INTRAVENOUS | Status: DC | PRN
  Administered 2012-05-31: 500 mL via INTRAVENOUS

## 2012-05-30 MED ORDER — ONDANSETRON HCL 4 MG/2ML IJ SOLN
4.0000 mg | Freq: Four times a day (QID) | INTRAMUSCULAR | Status: DC | PRN
  Administered 2012-06-02: 4 mg via INTRAVENOUS
  Filled 2012-05-30: qty 2

## 2012-05-30 MED ORDER — OXYCODONE-ACETAMINOPHEN 5-325 MG PO TABS: 1.0000 | ORAL_TABLET | ORAL | Status: DC | PRN

## 2012-05-30 MED ORDER — FLEET ENEMA 7-19 GM/118ML RE ENEM: 1.0000 | ENEMA | RECTAL | Status: DC | PRN

## 2012-05-30 MED ORDER — PENICILLIN G POTASSIUM 5000000 UNITS IJ SOLR
2.5000 10*6.[IU] | INTRAVENOUS | Status: DC
  Administered 2012-05-31 – 2012-06-02 (×17): 2.5 10*6.[IU] via INTRAVENOUS
  Filled 2012-05-30 (×20): qty 2.5

## 2012-05-30 MED ORDER — INSULIN ASPART 100 UNIT/ML ~~LOC~~ SOLN: 2.1000 [IU] | SUBCUTANEOUS | Status: DC

## 2012-05-30 MED ORDER — PENICILLIN G POTASSIUM 5000000 UNITS IJ SOLR
5.0000 10*6.[IU] | Freq: Once | INTRAVENOUS | Status: AC
  Administered 2012-05-30: 5 10*6.[IU] via INTRAVENOUS
  Filled 2012-05-30: qty 5

## 2012-05-30 MED ORDER — OXYTOCIN 40 UNITS IN LACTATED RINGERS INFUSION - SIMPLE MED: 62.5000 mL/h | INTRAVENOUS | Status: DC

## 2012-05-30 MED ORDER — IBUPROFEN 600 MG PO TABS: 600.0000 mg | ORAL_TABLET | Freq: Four times a day (QID) | ORAL | Status: DC | PRN

## 2012-05-30 MED ORDER — INSULIN PUMP
Freq: Three times a day (TID) | SUBCUTANEOUS | Status: DC
  Administered 2012-05-30 (×2): via SUBCUTANEOUS
  Administered 2012-05-31: 10 via SUBCUTANEOUS
  Administered 2012-05-31: 5.8 via SUBCUTANEOUS
  Administered 2012-05-31: 20:00:00 via SUBCUTANEOUS
  Administered 2012-05-31: 3 via SUBCUTANEOUS
  Administered 2012-06-01 (×2): via SUBCUTANEOUS
  Filled 2012-05-30: qty 1

## 2012-05-30 MED ORDER — ACETAMINOPHEN 325 MG PO TABS
650.0000 mg | ORAL_TABLET | ORAL | Status: DC | PRN
  Administered 2012-05-31 – 2012-06-01 (×4): 650 mg via ORAL
  Filled 2012-05-30 (×2): qty 2
  Filled 2012-05-30: qty 1
  Filled 2012-05-30 (×2): qty 2

## 2012-05-30 NOTE — Telephone Encounter (Addendum)
Message copied by Jill Side on Tue May 30, 2012  2:27 PM ------      Message from: Reva Bores      Created: Tue May 30, 2012  2:04 PM       Her serum creatinine is high--have pt. Come in for BP check today if possible. ------Called pt and left message that we would like her to come in for BP check today by 4pm. If not possible today, then first thing tomorrow morning. Please call back.  Pt arrived to clinic at 1512 for BP check. See other notes.

## 2012-05-30 NOTE — Progress Notes (Signed)
Pt came to clinic as requested for BP check. She currently has mild H/A and has had several episodes of not being able to focus her vision today.  Results reported to Dr. Shawnie Pons and she spoke w/pt via phone. Plan is for pt to be admitted for IOL today. She will go home to gather belongings, then return for direct admit.

## 2012-05-30 NOTE — H&P (Signed)
Karina [name] is a 27 y.o. female presenting for IOL. Maternal Medical History:  Reason for admission: Nausea. Pt. Is a G1P0 @ [redacted]w[redacted]d who has late developing elevated BP with abnl labs to include a Cr of 1.0--previously 0.2.  Admitted for IOL for HTN.  Contractions: Frequency: rare.    Fetal activity: Perceived fetal activity is normal.    Prenatal complications: PIH.   No bleeding.   Prenatal Complications - Diabetes: type 2. Diabetes is managed by insulin pump.      OB History   Grav Para Term Preterm Abortions TAB SAB Ect Mult Living   1              Past Medical History  Diagnosis Date  . Diabetes mellitus     Type 2, insulin resistant   Past Surgical History  Procedure Laterality Date  . Cervical biopsy  2004   Family History: family history includes Diabetes in her father. Social History:  reports that she has never smoked. She has never used smokeless tobacco. She reports that she does not drink alcohol or use illicit drugs.    Review of Systems  Constitutional: Negative for fever and chills.  Respiratory: Negative for shortness of breath.   Cardiovascular: Negative for chest pain and palpitations.  Gastrointestinal: Negative for nausea, vomiting and abdominal pain.  Genitourinary: Negative for dysuria and urgency.  Neurological: Positive for headaches. Negative for seizures.       Vision change    Dilation: 1.5 Effacement (%): 80 Station: -2 Blood pressure 150/94, pulse 91, last menstrual period 09/04/2011. Maternal Exam:  Uterine Assessment: Contraction frequency is rare.   Abdomen: Estimated fetal weight is 7 lb 3 oz.   Fetal presentation: vertex  Introitus: Normal vulva. Normal vagina.  Pelvis: adequate for delivery.   Cervix: Cervix evaluated by digital exam.   1-2/80-mid/soft/-2  Physical Exam  Vitals reviewed. Constitutional: She is oriented to person, place, and time. She appears well-developed and well-nourished.  Eyes: No scleral icterus.   Neck: Neck supple.  Cardiovascular: Normal rate and regular rhythm.   Respiratory: Effort normal and breath sounds normal.  GI: Soft. There is no tenderness.  gravid  Musculoskeletal: Normal range of motion.  Neurological: She is alert and oriented to person, place, and time.  Skin: Skin is warm and dry. No rash noted.  Psychiatric: She has a normal mood and affect. Her behavior is normal.    Prenatal labs: ABO, Rh: --/--/A POS (11/05 0200) Antibody: NEG (12/09 0953) Rubella:   RPR: NON REAC (03/03 1326)  HBsAg: NEGATIVE (12/09 0953)  HIV: NON REACTIVE (12/09 0953)  GBS: Positive (12/09 0000)   Assessment/Plan: Patient Active Problem List   Diagnosis Date Noted  . High-risk pregnancy supervision 12/20/2011    Priority: High  . Gestational hypertension w/o significant proteinuria in 3rd trimester 05/29/2012    Priority: Medium  . Rubella non-immune status, antepartum 01/03/2012    Priority: Medium  . Preexisting Diabetes mellitus, antepartum 11/16/2011    Priority: Medium  . GBS (group B streptococcus) UTI complicating pregnancy 12/25/2011   For IOL with cytotec Continue OB diet and insulin pump util active labor Begin PCN for GBS prophylaxis  Alphonsa Brickle S 05/30/2012, 6:37 PM

## 2012-05-31 LAB — GLUCOSE, CAPILLARY
Glucose-Capillary: 85 mg/dL (ref 70–99)
Glucose-Capillary: 144 mg/dL — ABNORMAL HIGH (ref 70–99)
Glucose-Capillary: 125 mg/dL — ABNORMAL HIGH (ref 70–99)

## 2012-05-31 LAB — RPR: RPR Ser Ql: NONREACTIVE

## 2012-05-31 MED ORDER — ZOLPIDEM TARTRATE 5 MG PO TABS: 5.0000 mg | ORAL_TABLET | Freq: Every evening | ORAL | Status: DC | PRN

## 2012-05-31 MED ORDER — OXYTOCIN 40 UNITS IN LACTATED RINGERS INFUSION - SIMPLE MED
1.0000 m[IU]/min | INTRAVENOUS | Status: DC
  Administered 2012-05-31: 2 m[IU]/min via INTRAVENOUS
  Administered 2012-05-31: 6 m[IU]/min via INTRAVENOUS
  Administered 2012-05-31: 5 m[IU]/min via INTRAVENOUS
  Administered 2012-06-01: 32 m[IU]/min via INTRAVENOUS
  Administered 2012-06-02: 40 m[IU]/min via INTRAVENOUS
  Filled 2012-05-31 (×2): qty 1000

## 2012-05-31 MED ORDER — TERBUTALINE SULFATE 1 MG/ML IJ SOLN
0.2500 mg | Freq: Once | INTRAMUSCULAR | Status: AC
  Administered 2012-05-31: 0.25 mg via SUBCUTANEOUS

## 2012-05-31 MED ORDER — FENTANYL CITRATE 0.05 MG/ML IJ SOLN
INTRAMUSCULAR | Status: AC
  Administered 2012-05-31: 100 ug via INTRAVENOUS
  Filled 2012-05-31: qty 2

## 2012-05-31 MED ORDER — FENTANYL CITRATE 0.05 MG/ML IJ SOLN
100.0000 ug | INTRAMUSCULAR | Status: DC | PRN
  Administered 2012-05-31 – 2012-06-01 (×5): 100 ug via INTRAVENOUS
  Filled 2012-05-31 (×5): qty 2

## 2012-05-31 NOTE — Progress Notes (Signed)
Karina [name] is a 27 y.o. G1P0 at [redacted]w[redacted]d   Subjective: Mostly comfortable; s/p Cytotec x 2  Objective: BP 154/88  Pulse 87  Temp(Src) 97.6 F (36.4 C) (Oral)  Resp 20  Ht 5\' 2"  (1.575 m)  Wt 243 lb 12.8 oz (110.587 kg)  BMI 44.58 kg/m2  LMP 09/04/2011      FHT:  FHR: 145 bpm, variability: moderate,  accelerations:  Present,  decelerations:  Absent UC:   regular, every 2-4 minutes SVE:   Dilation: 2.5 Effacement (%): 70 Station: -2;-1 Exam by:: l.poore, rn foley bulb inserted without difficulty and inflated with 60cc CBG 112  Labs: Lab Results  Component Value Date   WBC 6.8 05/30/2012   HGB 11.7* 05/30/2012   HCT 34.1* 05/30/2012   MCV 79.3 05/30/2012   PLT 196 05/30/2012    Assessment / Plan: IOL process Unfavorable cx Class B DM GHTN- BPs stable  Leave foley in place until it comes out; fentanyl prn pain  SHAW, KIMBERLY 05/31/2012, 4:04 AM

## 2012-05-31 NOTE — Progress Notes (Addendum)
   Subjective: RN reports SROM @ 2330 clear fluid. Pt asked for pain medicine.  Objective: BP 142/88  Pulse 93  Temp(Src) 98.3 F (36.8 C) (Oral)  Resp 18  Ht 5\' 2"  (1.575 m)  Wt 110.587 kg (243 lb 12.8 oz)  BMI 44.58 kg/m2  SpO2 86%  LMP 09/04/2011       FHT:  FHR: 140's bpm, variability: moderate,  accelerations:  Present,  decelerations:  Absent UC:   regular, every 2-3 minutes on Pitocin 11 mu's SVE:   Dilation: 4.5 Effacement (%): 60 Station: -2 Exam by:: [employer]: Lab Results  Component Value Date   WBC 6.8 05/30/2012   HGB 11.7* 05/30/2012   HCT 34.1* 05/30/2012   MCV 79.3 05/30/2012   PLT 196 05/30/2012    Assessment / Plan:  Labor: Induction of Labor, progressing Preeclampsia:  No symptoms Fetal Wellbeing:  Category I Pain Control:  Fentanyl I/D:  GBS pos Anticipated MOD:  NSVD  Lonnel Gjerde A 05/31/2012, 11:54 PM

## 2012-05-31 NOTE — Progress Notes (Signed)
Inpatient Diabetes Program Recommendations  Diabetes Treatment Program Recommendations  ADA Standards of Care 2012 Diabetes in Pregnancy Target Glucose Ranges:  Fasting: 60 - 90 mg/dL Preprandial: 60 - 564 mg/dL 1 hr postprandial: Less than 140mg /dL (from first bite of meal) 2 hr postprandial: Less than 120 mg/dL (from first bit of meal)   Pt on insulin pump in birthing suites not yet in active labor. Once/if  patient is actively laboring/ her insulin needs will decrease. Pt could possibly suspend the pump and check glucose q 2 hrs.  If insulin needed, would recommend that patient be given IV insulin drip per GS if her glucose levels begin to drop. Pt is type 2 and threat of DKA is not typical.  If pt needs C/section, please check glucose and if greater than 120 mg/dL, please use IV insulin drip throughout the surgery and PACU. Pt may not need any more than q 4 hr correction scale once delivered.  Please feel free to call once pt is delivered or for any assistance throughout.  Thank you, Lenor Coffin, RN, CNS, Diabetes Coordinator [phone])

## 2012-05-31 NOTE — Progress Notes (Signed)
  Subjective: No preeclampsia symptoms at this time. States ctxs very mild.  Objective: BP 142/90  Pulse 89  Temp(Src) 98 F (36.7 C) (Oral)  Resp 18  Ht 5\' 2"  (1.575 m)  Wt 110.587 kg (243 lb 12.8 oz)  BMI 44.58 kg/m2  SpO2 86%  LMP 09/04/2011      FHT:  FHR: 150's bpm, variability: moderate,  accelerations:  Present,  decelerations:  Absent UC:    obgyn contractions irregular  SVE:   4-5/50%/-2 per RN  Labs: Lab Results  Component Value Date   WBC 6.8 05/30/2012   HGB 11.7* 05/30/2012   HCT 34.1* 05/30/2012   MCV 79.3 05/30/2012   PLT 196 05/30/2012    Assessment / Plan: IUP @ 38 4/7 weeks Augmentation of labor Preeclampsia:  Asymptomatic Fetal Wellbeing:  Category I Pain Control:  Labor support without medications GBS pos Anticipate SVD  Karina [name] A 05/31/2012, 1:46 PM

## 2012-05-31 NOTE — Progress Notes (Signed)
   Subjective: Pt sitting up in chair taking clear liquids. Just asked nurse for Tylenol. States ctxs are mild. On pitocin  Objective: BP 135/89  Pulse 88  Temp(Src) 98.1 F (36.7 C) (Oral)  Resp 18  Ht 5\' 2"  (1.575 m)  Wt 110.587 kg (243 lb 12.8 oz)  BMI 44.58 kg/m2  SpO2 86%  LMP 09/04/2011      FHT:  FHR: 130's-140's bpm, variability: moderate,  accelerations:  Present,  decelerations:  Absent UC:   regular, every 3-5 minutes SVE:   Dilation: 4.5 Effacement (%): 50 Station: -2 Exam by:: Paul Dykes RNC  Labs: Lab Results  Component Value Date   WBC 6.8 05/30/2012   HGB 11.7* 05/30/2012   HCT 34.1* 05/30/2012   MCV 79.3 05/30/2012   PLT 196 05/30/2012    Assessment / Plan: IUP @ 38 4/7 weeks  Labor: Induction of labor progressing Preeclampsia:  no signs or symptoms of toxicity Fetal Wellbeing:  Category I Pain Control:  Labor support without medications I/D:  GBS pos Anticipated MOD:  SVD  Ethleen Lormand A 05/31/2012, 7:11 PM

## 2012-05-31 NOTE — Progress Notes (Signed)
efm removed for shower pt instructed to call when shower complete.

## 2012-05-31 NOTE — Progress Notes (Signed)
   Subjective: Doing well; no preX symptoms.  Desires shower and breakfast.    Objective: BP 131/91  Pulse 82  Temp(Src) 98 F (36.7 C) (Oral)  Resp 18  Ht 5\' 2"  (1.575 m)  Wt 110.587 kg (243 lb 12.8 oz)  BMI 44.58 kg/m2  SpO2 86%  LMP 09/04/2011      FHT:  FHR: 130's bpm, variability: moderate,  accelerations:  Present,  decelerations:  Absent UC:   irregular, every 2-3 minutes SVE:   Dilation: 3.5 Effacement (%): 50 Station: -3 Exam by:: Roney Marion, CNM  Labs: Lab Results  Component Value Date   WBC 6.8 05/30/2012   HGB 11.7* 05/30/2012   HCT 34.1* 05/30/2012   MCV 79.3 05/30/2012   PLT 196 05/30/2012    Assessment / Plan: Augmentation of labor, progressing well  Labor: Progressing normally Preeclampsia:  n/a Fetal Wellbeing:  Category I Pain Control:  Labor support without medications I/D:  GBS pos Anticipated MOD:  NSVD  [facility] 05/31/2012, 10:17 AM

## 2012-06-01 ENCOUNTER — Telehealth: Payer: Self-pay | Admitting: *Deleted

## 2012-06-01 ENCOUNTER — Other Ambulatory Visit: Payer: Medicaid Other

## 2012-06-01 LAB — PROTEIN / CREATININE RATIO, URINE
Creatinine, Urine: 47.1 mg/dL
Total Protein, Urine: 12 mg/dL

## 2012-06-01 LAB — CBC
MCH: 27.3 pg (ref 26.0–34.0)
Platelets: 188 10*3/uL (ref 150–400)
RBC: 4.17 MIL/uL (ref 3.87–5.11)

## 2012-06-01 LAB — GLUCOSE, CAPILLARY
Glucose-Capillary: 61 mg/dL — ABNORMAL LOW (ref 70–99)
Glucose-Capillary: 124 mg/dL — ABNORMAL HIGH (ref 70–99)

## 2012-06-01 MED ORDER — LIDOCAINE HCL (PF) 1 % IJ SOLN
INTRAMUSCULAR | Status: DC | PRN
  Administered 2012-06-01 (×2): 5 mL

## 2012-06-01 MED ORDER — PHENYLEPHRINE 40 MCG/ML (10ML) SYRINGE FOR IV PUSH (FOR BLOOD PRESSURE SUPPORT)
80.0000 ug | PREFILLED_SYRINGE | INTRAVENOUS | Status: DC | PRN
  Filled 2012-06-01: qty 5

## 2012-06-01 MED ORDER — EPHEDRINE 5 MG/ML INJ: 10.0000 mg | INTRAVENOUS | Status: DC | PRN

## 2012-06-01 MED ORDER — DIPHENHYDRAMINE HCL 50 MG/ML IJ SOLN: 12.5000 mg | INTRAMUSCULAR | Status: DC | PRN

## 2012-06-01 MED ORDER — LACTATED RINGERS IV SOLN
500.0000 mL | Freq: Once | INTRAVENOUS | Status: AC
  Administered 2012-06-01: 500 mL via INTRAVENOUS

## 2012-06-01 MED ORDER — PHENYLEPHRINE 40 MCG/ML (10ML) SYRINGE FOR IV PUSH (FOR BLOOD PRESSURE SUPPORT): 80.0000 ug | PREFILLED_SYRINGE | INTRAVENOUS | Status: DC | PRN

## 2012-06-01 MED ORDER — FENTANYL 2.5 MCG/ML BUPIVACAINE 1/10 % EPIDURAL INFUSION (WH - ANES)
14.0000 mL/h | INTRAMUSCULAR | Status: DC | PRN
  Administered 2012-06-01 – 2012-06-02 (×5): 14 mL/h via EPIDURAL
  Filled 2012-06-01 (×5): qty 125

## 2012-06-01 MED ORDER — EPHEDRINE 5 MG/ML INJ
10.0000 mg | INTRAVENOUS | Status: DC | PRN
  Filled 2012-06-01: qty 4

## 2012-06-01 NOTE — Progress Notes (Signed)
Karina [name] is a 27 y.o. G1P0 at [redacted]w[redacted]d by ultrasound admitted for induction of labor due to Diabetes and Hypertension.  Subjective: Doing well. Foley out  Objective: BP 133/82  Pulse 95  Temp(Src) 98.1 F (36.7 C) (Oral)  Resp 18  Ht 5\' 2"  (1.575 m)  Wt 110.587 kg (243 lb 12.8 oz)  BMI 44.58 kg/m2  SpO2 100%  LMP 09/04/2011   Total I/O In: -  Out: 625 [Urine:625] Filed Vitals:   06/01/12 0833 06/01/12 0903 06/01/12 0933 06/01/12 1003  BP: 122/76 135/80 125/72 133/82  Pulse: 83 98 89 95  Temp:      TempSrc:      Resp: 20 18 18 18   Height:      Weight:      SpO2:        FHT:  FHR: 140 bpm, variability: moderate,  accelerations:  Present,  decelerations:  Absent UC:   regular, every 3 minutes SVE:   Dilation: 5 Effacement (%): 80 Station: -2 Exam by:: [employer]: Lab Results  Component Value Date   WBC 7.0 05/31/2012   HGB 11.4* 05/31/2012   HCT 33.4* 05/31/2012   MCV 80.1 05/31/2012   PLT 188 05/31/2012    Assessment / Plan: Induction of labor due to gestational hypertension and gestational diabetes,  progressing well on pitocin  Labor: Progressing on Pitocin, will continue to increase then AROM Preeclampsia:  labs stable Fetal Wellbeing:  Category I Pain Control:  Labor support without medications I/D:  n/a Anticipated MOD:  NSVD  Eduardo Honor 06/01/2012, 10:15 AM

## 2012-06-01 NOTE — Progress Notes (Signed)
Karina [name] is a 27 y.o. 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  Subjective: Patient is comfortable with epidural. No preeclampsia symptoms.  Objective: BP 98/79  Pulse 107  Temp(Src) 98.1 F (36.7 C) (Oral)  Resp 18  Ht 5\' 2"  (1.575 m)  Wt 243 lb 12.8 oz (110.587 kg)  BMI 44.58 kg/m2  SpO2 100%  LMP 09/04/2011 I/O last 3 completed shifts: In: -  Out: 2325 [Urine:2325]   FHT:  FHR: 150 bpm, variability: moderate,  accelerations:  Present,  decelerations:  Absent UC:   regular, every 2-3 minutes.  MVU currently at 149; but has been adequate for several previous hours.  Pitocin is at 30 mu/min. SVE:   Dilation: 7 Effacement (%): 90 Station: -2 Exam by:: Dr. Macon Large Significant molding and caput of fetal head noted Cervical exam unchanged for six hours  Labs: Lab Results  Component Value Date   WBC 7.0 05/31/2012   HGB 11.4* 05/31/2012   HCT 33.4* 05/31/2012   MCV 80.1 05/31/2012   PLT 188 05/31/2012   Assessment / Plan: Protracted active phase/failure of cervical dilatation.  Discussed protracted active phase of labor/no significant cervical change in several hours despite adequate contractions.  Recommended for cesarean section due to failure of cervical dilatation. Patient declined this option, did not give consent for this procedure.  She feels that there is no urgency given that the fetal heart tracing is reactive and there is no maternal/fetal distress.  She desires to wait until tomorrow morning, then have another discussion about mode of delivery.  She is worried about wound infections given her BMI and insulin-dependent diabetes and wants a cesarean delivery to be her absolute last resort.  She understands that if at anytime she or the fetus is in distress, this may constitute a separate and probable more urgent indication for cesarean delivery.  Patient will continue pitocin induction, understands  that long period of pitocin induction can increase risk of uterine atony and hemorrhage after delivery.   Also understands the the fetal head can become more molded and have more edema which may make cesarean delivery difficult and result in extension of hysterotomy.     Fetal heart rate tracing iscurrently reactive; Category I.  Patient is very hopeful for a vaginal delivery.  Will continue routine care and reevaluate in the morning as per patient preference or earlier if indicated.   Karina [name] A 06/01/2012, 9:11 PM

## 2012-06-01 NOTE — Anesthesia Procedure Notes (Signed)
Epidural Patient location during procedure: OB Start time: 06/01/2012 3:49 AM  Preanesthetic Checklist Completed: patient identified, site marked, surgical consent, pre-op evaluation, timeout performed, IV checked, risks and benefits discussed and monitors and equipment checked  Epidural Patient position: sitting Prep: site prepped and draped and DuraPrep Patient monitoring: continuous pulse ox and blood pressure Approach: midline Injection technique: LOR air  Needle:  Needle type: Tuohy  Needle gauge: 17 G Needle length: 9 cm and 9 Needle insertion depth: 8 cm Catheter type: closed end flexible Catheter size: 19 Gauge Catheter at skin depth: 14 cm Test dose: negative  Assessment Events: blood not aspirated, injection not painful, no injection resistance, negative IV test and no paresthesia  Additional Notes Patient identified.  Risk benefits discussed including failed block, incomplete pain control, headache, nerve damage, paralysis, blood pressure changes, nausea, vomiting, reactions to medication both toxic or allergic, and postpartum back pain.  Patient expressed understanding and wished to proceed.  All questions were answered.  Sterile technique used throughout procedure and epidural site dressed with sterile barrier dressing. No paresthesia or other complications noted.The patient did not experience any signs of intravascular injection such as tinnitus or metallic taste in mouth nor signs of intrathecal spread such as rapid motor block. Please see nursing notes for vital signs.

## 2012-06-01 NOTE — Anesthesia Preprocedure Evaluation (Addendum)
Anesthesia Evaluation  Patient identified by MRN, date of birth, ID band Patient awake    Reviewed: Allergy & Precautions, H&P , Patient's Chart, lab work & pertinent test results  Airway Mallampati: III TM Distance: >3 FB Neck ROM: full    Dental no notable dental hx.    Pulmonary neg pulmonary ROS,  breath sounds clear to auscultation  Pulmonary exam normal       Cardiovascular hypertension, negative cardio ROS  Rhythm:regular Rate:Normal     Neuro/Psych negative neurological ROS  negative psych ROS   GI/Hepatic negative GI ROS, Neg liver ROS,   Endo/Other  negative endocrine ROSdiabetesMorbid obesity  Renal/GU negative Renal ROS     Musculoskeletal   Abdominal   Peds  Hematology negative hematology ROS (+)   Anesthesia Other Findings   Reproductive/Obstetrics (+) Pregnancy                          Anesthesia Physical Anesthesia Plan  ASA: III and emergent  Anesthesia Plan: Epidural   Post-op Pain Management:    Induction:   Airway Management Planned:   Additional Equipment:   Intra-op Plan:   Post-operative Plan:   Informed Consent: I have reviewed the patients History and Physical, chart, labs and discussed the procedure including the risks, benefits and alternatives for the proposed anesthesia with the patient or authorized representative who has indicated his/her understanding and acceptance.   Dental advisory given  Plan Discussed with: CRNA, Anesthesiologist and Surgeon  Anesthesia Plan Comments:        Anesthesia Quick Evaluation

## 2012-06-01 NOTE — Progress Notes (Signed)
I have seen and examined this patient and I agree with the above. Cam Hai 12:32 AM 06/01/2012

## 2012-06-01 NOTE — Telephone Encounter (Signed)
  Patient called me and explained that Lenor Coffin, RN wanted to reduce her basal rates by 50% prior to her induction. So I instructed her to open up her Pattern Screen and we programmed at Pattern A below in bold print:  Pump Settings: Date: Current Date: 06/01/2012 added Pattern A  Basal Rates below   Basal Rate: Carb Ratio Sensitivity  Basal Rate: Pattern A Carb Ratio Sensitivity   MN: 2.15 MN: 5.0 17 MN: 1.10    2 PM 2.40 11 AM: 6.0  2 PM 1.20

## 2012-06-01 NOTE — Progress Notes (Signed)
IUPC replaced. Continue pitocin induction per protocol. Exam unchanged. FHR reactive.  Hopeful for SVD.

## 2012-06-01 NOTE — Progress Notes (Signed)
Karina [name] is a 27 y.o. G1P0 at [redacted]w[redacted]d by ultrasound admitted for induction of labor due to Diabetes.  Subjective: Doing well.  Feels some pressure  Objective: BP 118/78  Pulse 96  Temp(Src) 98 F (36.7 C) (Oral)  Resp 20  Ht 5\' 2"  (1.575 m)  Wt 110.587 kg (243 lb 12.8 oz)  BMI 44.58 kg/m2  SpO2 100%  LMP 09/04/2011   Total I/O In: -  Out: 1625 [Urine:1625]  FHT:  FHR: 140 bpm, variability: moderate,  accelerations:  Present,  decelerations:  Absent UC:   regular, every 3 minutes SVE:   Dilation: 6.5 Effacement (%): 90 Station: -2;-1 Exam by:: Wynelle Bourgeois, CNM There is some molding of head   Labs: Lab Results  Component Value Date   WBC 7.0 05/31/2012   HGB 11.4* 05/31/2012   HCT 33.4* 05/31/2012   MCV 80.1 05/31/2012   PLT 188 05/31/2012    Assessment / Plan: Induction of labor due to gestational diabetes,  progressing well on pitocin  Labor: Somewhat slow progress through active phase Preeclampsia:  n/a Fetal Wellbeing:  Category I Pain Control:  Labor support without medications I/D:  n/a Anticipated MOD:  NSVD  Karina [name] 06/01/2012, 3:06 PM

## 2012-06-01 NOTE — Progress Notes (Signed)
06/01/12  Requested that patient reduce her basal rates to 50%  Pt able to do this. Again, the patient may not need any insulin during labor and delivery.  Once baby is delivered,  Placenta hormones are no longer an influence on insulin resistance. The labor and delivery will actually decrease resistance and  Increases the insulin sensitivity. Will follow patient and assist as requested/needed. Thank you, Lenor Coffin, RN, CNS, Diabetes Coordinator [phone])

## 2012-06-01 NOTE — Progress Notes (Signed)
Karina [name] is a 27 y.o. G1P0 at [redacted]w[redacted]d by ultrasound admitted for induction of labor due to Diabetes.  Subjective: Doing well, comfortable   Objective: BP 130/83  Pulse 91  Temp(Src) 98.6 F (37 C) (Oral)  Resp 18  Ht 5\' 2"  (1.575 m)  Wt 110.587 kg (243 lb 12.8 oz)  BMI 44.58 kg/m2  SpO2 100%  LMP 09/04/2011   Total I/O In: -  Out: 1625 [Urine:1625]  FHT:  FHR: 140 bpm, variability: moderate,  accelerations:  Present,  decelerations:  Absent UC:   regular, every 2-3 minutes SVE:   Dilation: 7 Effacement (%): 90 Station: -1 Exam by:: Wynelle Bourgeois, CNM  Labs: Lab Results  Component Value Date   WBC 7.0 05/31/2012   HGB 11.4* 05/31/2012   HCT 33.4* 05/31/2012   MCV 80.1 05/31/2012   PLT 188 05/31/2012    Assessment / Plan: Induction of labor due to gestational diabetes,  progressing well on pitocin  Labor: Progressing normally Preeclampsia:  n/a Fetal Wellbeing:  Category I Pain Control:  Epidural I/D:  n/a Anticipated MOD:  NSVD  Karina [name] 06/01/2012, 6:28 PM

## 2012-06-01 NOTE — Progress Notes (Signed)
I have seen and examined this patient and I agree with the above. Karina [name] 2:50 AM 06/01/2012

## 2012-06-01 NOTE — Progress Notes (Signed)
CBG at 1103 this AM was 73 per hospital CBG machine.  Result has not yet transferred from machine to pt's flowsheet in EPIC.

## 2012-06-01 NOTE — Progress Notes (Signed)
Dr. In to discuss c-section. Pt declined and present.

## 2012-06-02 ENCOUNTER — Encounter (HOSPITAL_COMMUNITY)
Admission: AD | Disposition: A | Discharge status: Home / Self Care | Payer: Self-pay | Source: Ambulatory Visit | Attending: Obstetrics and Gynecology

## 2012-06-02 ENCOUNTER — Encounter (HOSPITAL_COMMUNITY): Payer: Self-pay | Admitting: Registered Nurse

## 2012-06-02 ENCOUNTER — Encounter (HOSPITAL_COMMUNITY): Payer: Self-pay | Admitting: Anesthesiology

## 2012-06-02 ENCOUNTER — Inpatient Hospital Stay (HOSPITAL_COMMUNITY): Payer: Medicaid Other | Admitting: Anesthesiology

## 2012-06-02 LAB — GLUCOSE, CAPILLARY
Glucose-Capillary: 73 mg/dL (ref 70–99)
Glucose-Capillary: 139 mg/dL — ABNORMAL HIGH (ref 70–99)
Glucose-Capillary: 185 mg/dL — ABNORMAL HIGH (ref 70–99)
Glucose-Capillary: 179 mg/dL — ABNORMAL HIGH (ref 70–99)
Glucose-Capillary: 187 mg/dL — ABNORMAL HIGH (ref 70–99)
Glucose-Capillary: 225 mg/dL — ABNORMAL HIGH (ref 70–99)
Glucose-Capillary: 204 mg/dL — ABNORMAL HIGH (ref 70–99)

## 2012-06-02 LAB — CBC
MCH: 27.5 pg (ref 26.0–34.0)
MCHC: 34.7 g/dL (ref 30.0–36.0)
MCV: 79.5 fL (ref 78.0–100.0)
Platelets: 185 10*3/uL (ref 150–400)

## 2012-06-02 SURGERY — Surgical Case
Anesthesia: Epidural | Wound class: Clean Contaminated

## 2012-06-02 MED ORDER — LACTATED RINGERS IV SOLN
INTRAVENOUS | Status: DC | PRN
  Administered 2012-06-02 (×2): via INTRAVENOUS

## 2012-06-02 MED ORDER — LABETALOL HCL 5 MG/ML IV SOLN
20.0000 mg | Freq: Once | INTRAVENOUS | Status: AC
  Administered 2012-06-02: 20 mg via INTRAVENOUS
  Filled 2012-06-02: qty 4

## 2012-06-02 MED ORDER — FERROUS SULFATE 325 (65 FE) MG PO TABS
325.0000 mg | ORAL_TABLET | Freq: Two times a day (BID) | ORAL | Status: DC
  Administered 2012-06-03 – 2012-06-04 (×3): 325 mg via ORAL
  Filled 2012-06-02 (×4): qty 1

## 2012-06-02 MED ORDER — OXYTOCIN 40 UNITS IN LACTATED RINGERS INFUSION - SIMPLE MED: 1.0000 m[IU]/min | INTRAVENOUS | Status: DC

## 2012-06-02 MED ORDER — MEASLES, MUMPS & RUBELLA VAC ~~LOC~~ INJ
0.5000 mL | INJECTION | Freq: Once | SUBCUTANEOUS | Status: AC
  Administered 2012-06-03: 0.5 mL via SUBCUTANEOUS
  Filled 2012-06-02 (×2): qty 0.5

## 2012-06-02 MED ORDER — INSULIN ASPART 100 UNIT/ML ~~LOC~~ SOLN
0.0000 [IU] | Freq: Three times a day (TID) | SUBCUTANEOUS | Status: DC
Start: 2012-06-03 — End: 2012-06-03

## 2012-06-02 MED ORDER — FLEET ENEMA 7-19 GM/118ML RE ENEM: 1.0000 | ENEMA | Freq: Every day | RECTAL | Status: DC | PRN

## 2012-06-02 MED ORDER — SODIUM CHLORIDE 0.9 % IJ SOLN: 3.0000 mL | INTRAMUSCULAR | Status: DC | PRN

## 2012-06-02 MED ORDER — MORPHINE SULFATE (PF) 0.5 MG/ML IJ SOLN
INTRAMUSCULAR | Status: DC | PRN
  Administered 2012-06-02: 4 mg via EPIDURAL

## 2012-06-02 MED ORDER — NALOXONE HCL 1 MG/ML IJ SOLN: 1.0000 ug/kg/h | INTRAVENOUS | Status: DC | PRN

## 2012-06-02 MED ORDER — DIPHENHYDRAMINE HCL 50 MG/ML IJ SOLN: 25.0000 mg | INTRAMUSCULAR | Status: DC | PRN

## 2012-06-02 MED ORDER — OXYCODONE-ACETAMINOPHEN 5-325 MG PO TABS
1.0000 | ORAL_TABLET | ORAL | Status: DC | PRN
  Administered 2012-06-04 – 2012-06-05 (×3): 1 via ORAL
  Filled 2012-06-02 (×2): qty 1
  Filled 2012-06-02: qty 2

## 2012-06-02 MED ORDER — MAGNESIUM SULFATE BOLUS VIA INFUSION
4.0000 g | Freq: Once | INTRAVENOUS | Status: AC
  Administered 2012-06-02: 4 g via INTRAVENOUS
  Filled 2012-06-02: qty 500

## 2012-06-02 MED ORDER — ONDANSETRON HCL 4 MG/2ML IJ SOLN
INTRAMUSCULAR | Status: AC
  Filled 2012-06-02: qty 2

## 2012-06-02 MED ORDER — OXYTOCIN 10 UNIT/ML IJ SOLN
40.0000 [IU] | INTRAVENOUS | Status: DC | PRN
  Administered 2012-06-02: 40 [IU] via INTRAVENOUS

## 2012-06-02 MED ORDER — SIMETHICONE 80 MG PO CHEW
80.0000 mg | CHEWABLE_TABLET | Freq: Three times a day (TID) | ORAL | Status: DC
  Administered 2012-06-03 – 2012-06-05 (×7): 80 mg via ORAL

## 2012-06-02 MED ORDER — ONDANSETRON HCL 4 MG/2ML IJ SOLN: 4.0000 mg | INTRAMUSCULAR | Status: DC | PRN

## 2012-06-02 MED ORDER — FENTANYL CITRATE 0.05 MG/ML IJ SOLN
25.0000 ug | INTRAMUSCULAR | Status: DC | PRN
  Administered 2012-06-02 (×2): 50 ug via INTRAVENOUS

## 2012-06-02 MED ORDER — ONDANSETRON HCL 4 MG PO TABS: 4.0000 mg | ORAL_TABLET | ORAL | Status: DC | PRN

## 2012-06-02 MED ORDER — DIPHENHYDRAMINE HCL 25 MG PO CAPS: 25.0000 mg | ORAL_CAPSULE | Freq: Four times a day (QID) | ORAL | Status: DC | PRN

## 2012-06-02 MED ORDER — MEPERIDINE HCL 25 MG/ML IJ SOLN
INTRAMUSCULAR | Status: AC
  Administered 2012-06-02: 6.25 mg via INTRAVENOUS
  Filled 2012-06-02: qty 1

## 2012-06-02 MED ORDER — METOCLOPRAMIDE HCL 5 MG/ML IJ SOLN: 10.0000 mg | Freq: Three times a day (TID) | INTRAMUSCULAR | Status: DC | PRN

## 2012-06-02 MED ORDER — SCOPOLAMINE 1 MG/3DAYS TD PT72: 1.0000 | MEDICATED_PATCH | Freq: Once | TRANSDERMAL | Status: DC

## 2012-06-02 MED ORDER — TETANUS-DIPHTH-ACELL PERTUSSIS 5-2.5-18.5 LF-MCG/0.5 IM SUSP
0.5000 mL | Freq: Once | INTRAMUSCULAR | Status: DC
  Filled 2012-06-02: qty 0.5

## 2012-06-02 MED ORDER — OXYTOCIN 40 UNITS IN LACTATED RINGERS INFUSION - SIMPLE MED: 62.5000 mL/h | INTRAVENOUS | Status: AC

## 2012-06-02 MED ORDER — ONDANSETRON HCL 4 MG/2ML IJ SOLN: 4.0000 mg | Freq: Three times a day (TID) | INTRAMUSCULAR | Status: DC | PRN

## 2012-06-02 MED ORDER — FENTANYL CITRATE 0.05 MG/ML IJ SOLN
INTRAMUSCULAR | Status: AC
  Filled 2012-06-02: qty 2

## 2012-06-02 MED ORDER — SENNOSIDES-DOCUSATE SODIUM 8.6-50 MG PO TABS
2.0000 | ORAL_TABLET | Freq: Every day | ORAL | Status: DC
  Administered 2012-06-03 – 2012-06-04 (×2): 2 via ORAL

## 2012-06-02 MED ORDER — OXYTOCIN 10 UNIT/ML IJ SOLN
INTRAMUSCULAR | Status: AC
  Filled 2012-06-02: qty 4

## 2012-06-02 MED ORDER — SODIUM BICARBONATE 8.4 % IV SOLN
INTRAVENOUS | Status: DC | PRN
  Administered 2012-06-02: 5 mL via EPIDURAL

## 2012-06-02 MED ORDER — WITCH HAZEL-GLYCERIN EX PADS: 1.0000 "application " | MEDICATED_PAD | CUTANEOUS | Status: DC | PRN

## 2012-06-02 MED ORDER — LANOLIN HYDROUS EX OINT: 1.0000 "application " | TOPICAL_OINTMENT | CUTANEOUS | Status: DC | PRN

## 2012-06-02 MED ORDER — DIPHENHYDRAMINE HCL 50 MG/ML IJ SOLN: 12.5000 mg | INTRAMUSCULAR | Status: DC | PRN

## 2012-06-02 MED ORDER — DIBUCAINE 1 % RE OINT
1.0000 "application " | TOPICAL_OINTMENT | RECTAL | Status: DC | PRN
  Filled 2012-06-02: qty 28

## 2012-06-02 MED ORDER — DEXTROSE IN LACTATED RINGERS 5 % IV SOLN: INTRAVENOUS | Status: DC

## 2012-06-02 MED ORDER — MORPHINE SULFATE 0.5 MG/ML IJ SOLN
INTRAMUSCULAR | Status: AC
  Filled 2012-06-02: qty 10

## 2012-06-02 MED ORDER — PRENATAL MULTIVITAMIN CH
1.0000 | ORAL_TABLET | Freq: Every day | ORAL | Status: DC
  Administered 2012-06-03 – 2012-06-04 (×2): 1 via ORAL
  Filled 2012-06-02 (×2): qty 1

## 2012-06-02 MED ORDER — LACTATED RINGERS IV SOLN
INTRAVENOUS | Status: DC
  Administered 2012-06-03: 14:00:00 via INTRAVENOUS

## 2012-06-02 MED ORDER — LABETALOL HCL 5 MG/ML IV SOLN: 40.0000 mg | Freq: Once | INTRAVENOUS | Status: AC | PRN

## 2012-06-02 MED ORDER — ONDANSETRON HCL 4 MG/2ML IJ SOLN
INTRAMUSCULAR | Status: DC | PRN
  Administered 2012-06-02: 4 mg via INTRAVENOUS

## 2012-06-02 MED ORDER — CEFAZOLIN SODIUM-DEXTROSE 2-3 GM-% IV SOLR
INTRAVENOUS | Status: DC | PRN
  Administered 2012-06-02: 2 g via INTRAVENOUS

## 2012-06-02 MED ORDER — CEFAZOLIN SODIUM-DEXTROSE 2-3 GM-% IV SOLR
INTRAVENOUS | Status: AC
  Filled 2012-06-02: qty 50

## 2012-06-02 MED ORDER — IBUPROFEN 600 MG PO TABS
600.0000 mg | ORAL_TABLET | Freq: Four times a day (QID) | ORAL | Status: DC
  Administered 2012-06-03 – 2012-06-05 (×7): 600 mg via ORAL
  Filled 2012-06-02 (×7): qty 1

## 2012-06-02 MED ORDER — NALBUPHINE HCL 10 MG/ML IJ SOLN: 5.0000 mg | INTRAMUSCULAR | Status: DC | PRN

## 2012-06-02 MED ORDER — ZOLPIDEM TARTRATE 5 MG PO TABS: 5.0000 mg | ORAL_TABLET | Freq: Every evening | ORAL | Status: DC | PRN

## 2012-06-02 MED ORDER — INSULIN ASPART 100 UNIT/ML ~~LOC~~ SOLN
0.0000 [IU] | Freq: Every day | SUBCUTANEOUS | Status: DC
  Administered 2012-06-02: 2 [IU] via SUBCUTANEOUS

## 2012-06-02 MED ORDER — NALOXONE HCL 0.4 MG/ML IJ SOLN: 0.4000 mg | INTRAMUSCULAR | Status: DC | PRN

## 2012-06-02 MED ORDER — SCOPOLAMINE 1 MG/3DAYS TD PT72
MEDICATED_PATCH | TRANSDERMAL | Status: AC
  Administered 2012-06-02: 1.5 mg via TRANSDERMAL
  Filled 2012-06-02: qty 1

## 2012-06-02 MED ORDER — MENTHOL 3 MG MT LOZG
1.0000 | LOZENGE | OROMUCOSAL | Status: DC | PRN
  Filled 2012-06-02: qty 9

## 2012-06-02 MED ORDER — ACETAMINOPHEN 10 MG/ML IV SOLN
1000.0000 mg | Freq: Four times a day (QID) | INTRAVENOUS | Status: AC | PRN
  Administered 2012-06-02: 1000 mg via INTRAVENOUS
  Filled 2012-06-02: qty 100

## 2012-06-02 MED ORDER — BISACODYL 10 MG RE SUPP
10.0000 mg | Freq: Every day | RECTAL | Status: DC | PRN
  Administered 2012-06-05: 10 mg via RECTAL
  Filled 2012-06-02 (×3): qty 1

## 2012-06-02 MED ORDER — DIPHENHYDRAMINE HCL 25 MG PO CAPS: 25.0000 mg | ORAL_CAPSULE | ORAL | Status: DC | PRN

## 2012-06-02 MED ORDER — MAGNESIUM SULFATE 40 G IN LACTATED RINGERS - SIMPLE
2.0000 g/h | INTRAVENOUS | Status: DC
  Administered 2012-06-02 – 2012-06-03 (×2): 2 g/h via INTRAVENOUS
  Filled 2012-06-02 (×2): qty 500

## 2012-06-02 MED ORDER — SODIUM CHLORIDE 0.9 % IV SOLN
INTRAVENOUS | Status: DC
  Administered 2012-06-02: 1.3 [IU]/h via INTRAVENOUS
  Administered 2012-06-02: 3.3 [IU]/h via INTRAVENOUS
  Administered 2012-06-02: 1.4 [IU]/h via INTRAVENOUS
  Filled 2012-06-02: qty 1

## 2012-06-02 MED ORDER — ACETAMINOPHEN 10 MG/ML IV SOLN
INTRAVENOUS | Status: AC
  Filled 2012-06-02: qty 100

## 2012-06-02 MED ORDER — SODIUM BICARBONATE 8.4 % IV SOLN
INTRAVENOUS | Status: AC
  Filled 2012-06-02: qty 50

## 2012-06-02 MED ORDER — LIDOCAINE-EPINEPHRINE (PF) 2 %-1:200000 IJ SOLN
INTRAMUSCULAR | Status: AC
  Filled 2012-06-02: qty 20

## 2012-06-02 MED ORDER — SIMETHICONE 80 MG PO CHEW: 80.0000 mg | CHEWABLE_TABLET | ORAL | Status: DC | PRN

## 2012-06-02 MED ORDER — MEPERIDINE HCL 25 MG/ML IJ SOLN: 6.2500 mg | INTRAMUSCULAR | Status: DC | PRN

## 2012-06-02 SURGICAL SUPPLY — 34 items
APL SKNCLS STERI-STRIP NONHPOA (GAUZE/BANDAGES/DRESSINGS) ×1
BENZOIN TINCTURE PRP APPL 2/3 (GAUZE/BANDAGES/DRESSINGS) ×1 IMPLANT
BRR ADH 6X5 SEPRAFILM 1 SHT (MISCELLANEOUS)
CLAMP CORD UMBIL (MISCELLANEOUS) IMPLANT
CONTAINER PREFILL 10% NBF 15ML (MISCELLANEOUS) IMPLANT
DRAPE LG THREE QUARTER DISP (DRAPES) ×2 IMPLANT
DRSG OPSITE POSTOP 4X10 (GAUZE/BANDAGES/DRESSINGS) ×2 IMPLANT
DURAPREP 26ML APPLICATOR (WOUND CARE) ×2 IMPLANT
ELECT REM PT RETURN 9FT ADLT (ELECTROSURGICAL) ×2
ELECTRODE REM PT RTRN 9FT ADLT (ELECTROSURGICAL) ×1 IMPLANT
EXTRACTOR VACUUM M CUP 4 TUBE (SUCTIONS) IMPLANT
GLOVE BIOGEL PI IND STRL 6.5 (GLOVE) ×1 IMPLANT
GLOVE BIOGEL PI INDICATOR 6.5 (GLOVE) ×1
GLOVE SURG SS PI 6.0 STRL IVOR (GLOVE) ×2 IMPLANT
GOWN STRL REIN XL XLG (GOWN DISPOSABLE) ×4 IMPLANT
KIT ABG SYR 3ML LUER SLIP (SYRINGE) IMPLANT
NDL HYPO 25X5/8 SAFETYGLIDE (NEEDLE) IMPLANT
NDL KEITH (NEEDLE) IMPLANT
NEEDLE HYPO 25X5/8 SAFETYGLIDE (NEEDLE) IMPLANT
NEEDLE KEITH (NEEDLE) ×2 IMPLANT
NS IRRIG 1000ML POUR BTL (IV SOLUTION) ×2 IMPLANT
PACK C SECTION WH (CUSTOM PROCEDURE TRAY) ×2 IMPLANT
PAD OB MATERNITY 4.3X12.25 (PERSONAL CARE ITEMS) ×2 IMPLANT
RTRCTR C-SECT PINK 25CM LRG (MISCELLANEOUS) ×1 IMPLANT
SEPRAFILM MEMBRANE 5X6 (MISCELLANEOUS) IMPLANT
STAPLER VISISTAT 35W (STAPLE) ×2 IMPLANT
STRIP CLOSURE SKIN 1/2X4 (GAUZE/BANDAGES/DRESSINGS) ×1 IMPLANT
SUT PLAIN 0 NONE (SUTURE) IMPLANT
SUT PLAIN 2 0 XLH (SUTURE) ×1 IMPLANT
SUT VIC AB 0 CT1 36 (SUTURE) ×8 IMPLANT
SUT VIC AB 4-0 KS 27 (SUTURE) IMPLANT
TOWEL OR 17X24 6PK STRL BLUE (TOWEL DISPOSABLE) ×6 IMPLANT
TRAY FOLEY CATH 14FR (SET/KITS/TRAYS/PACK) ×2 IMPLANT
WATER STERILE IRR 1000ML POUR (IV SOLUTION) ×2 IMPLANT

## 2012-06-02 NOTE — Progress Notes (Signed)
Patient fully dilated since 5 am. She has pushed for 3 hours without fetal descent. Discussed with patient the need to proceed to delivery via cesarean section secondary to failure to descent likely secondary to cephalopelvic disproportion. Patient refused cesarean section and wishes to be re-evaluated in 2 hours. She states that she will "decide in 2 hours" if she is ready for a cesarean section. Advised against waiting but will respect the patient's wishes.   FHT: baseline 145, moderate variability, no accels, early decels  Toco: ctx q 2-3 minutes

## 2012-06-02 NOTE — Anesthesia Postprocedure Evaluation (Signed)
  Anesthesia Post-op Note  Patient: Karina [name]  Procedure(s) Performed: Procedure(s): CESAREAN SECTION (N/A)  Patient Location: PACU  Anesthesia Type:Epidural  Level of Consciousness: awake, alert  and oriented  Airway and Oxygen Therapy: Patient Spontanous Breathing  Post-op Pain: none  Post-op Assessment: Post-op Vital signs reviewed, Patient's Cardiovascular Status Stable, Respiratory Function Stable, Patent Airway, No signs of Nausea or vomiting, Pain level controlled, No headache and No backache  Post-op Vital Signs: Reviewed and stable  Complications: No apparent anesthesia complications

## 2012-06-02 NOTE — Transfer of Care (Signed)
Immediate Anesthesia Transfer of Care Note  Patient: Karina [name]  Procedure(s) Performed: Procedure(s): CESAREAN SECTION (N/A)  Patient Location: PACU  Anesthesia Type:Epidural  Level of Consciousness: awake, alert  and patient cooperative  Airway & Oxygen Therapy: Patient Spontanous Breathing and Patient connected to nasal cannula oxygen  Post-op Assessment: Report given to PACU RN and Post -op Vital signs reviewed and stable  Post vital signs: Reviewed  Complications: No apparent anesthesia complications

## 2012-06-02 NOTE — Progress Notes (Signed)
  Requested insulin pump be discontinued around 1600 yesterday afternoon. CBGs controlled at this point without insulin. Pt may need very little to no insulin immediately after delivery.  However, highly recommend that glucose be checked tidwc and use sensitive correction scale from "Glycemic Control order set".  If pt has c/section, will need sensitive correction q 4 hrs.  Please order cbg's either tidwc and HS or q 4 hrs if post -surgery. I'm glad to assist or help with any issues regarding glucose management.   Pt is pre-existing type 2; if no need for insulin at this time, please direct patient to follow per pre-pregnancy schedule of checking cbg's and follow up with her primary PCP.  Thank you, Lenor Coffin, RN, CNS, Diabetes Coordinator [phone]) [phone])

## 2012-06-02 NOTE — Preoperative (Signed)
Beta Blockers   Reason not to administer Beta Blockers:Not Applicable 

## 2012-06-02 NOTE — Progress Notes (Signed)
I examined pt and agree with documentation above and nurse midwife student plan of care. [name],Wylan Gentzler  

## 2012-06-02 NOTE — Progress Notes (Signed)
I examined pt and agree with documentation above and nurse midwife student plan of care. [name],Karina [name]  

## 2012-06-02 NOTE — Progress Notes (Signed)
Patient ID: Karina [name], female   DOB: [birth year], 27 y.o.   MRN: [id number]   S:  Pt tired. Still hurting in RLQ and vagina. Pushing 5 hours  O: Filed Vitals:   06/02/12 1330 06/02/12 1333 06/02/12 1406 06/02/12 1408  BP:  142/75 134/106 130/76  Pulse:  99 99 97  Temp:      TempSrc:      Resp: 20 20 20 20   Height:      Weight:      SpO2:       CBG (last 3)   Recent Labs  06/02/12 0445 06/02/12 1145 06/02/12 1433  GLUCAP 139* 182* 185*    Cervix:  No change:  Complete and +1/+2 Genitalia very edematous  FHTs: 140, mod var, few accels, no decels TOCO:  q 3 minutes, MVUs around 25 per ctx  A/P Discussed with pt that there vaginal delivery is not going to happen and that c-section is the only option for delivery at this point. The risks of cesarean section were discussed with the patient including but were not limited to: bleeding which may require transfusion or reoperation; infection which may require antibiotics; injury to bowel, bladder, ureters or other surrounding organs; injury to the fetus; need for additional procedures including hysterectomy in the event of a life-threatening hemorrhage; placental abnormalities wth subsequent pregnancies, incisional problems, thromboembolic phenomenon and other postoperative/anesthesia complications.  The patient concurred with the proposed plan, giving informed written consent for the procedures.  Patient will remain NPO for procedure. Anesthesia and OR aware.  Preoperative prophylactic antibiotics and SCDs ordered on call to the OR.  To OR when ready.  Napoleon Form, MD

## 2012-06-02 NOTE — Progress Notes (Signed)
   Karina [name] is a 27 y.o. G1P0 at [redacted]w[redacted]d  admitted for induction of labor due to Hypertension.  Subjective: Feeling more pressure.  Denies HA, vision changes  Objective: BP 162/106  Pulse 108  Temp(Src) 98.9 F (37.2 C) (Oral)  Resp 18  Ht 5\' 2"  (1.575 m)  Wt 110.587 kg (243 lb 12.8 oz)  BMI 44.58 kg/m2  SpO2 100%  LMP 09/04/2011    FHT:  FHR: 150 bpm, variability: moderate,  accelerations:  Present,  decelerations:  Absent UC:   regular, every 2-3 minutes SVE:   9/90/0 Pitocin @ 40 mu/min  Labs: Lab Results  Component Value Date   WBC 7.0 05/31/2012   HGB 11.4* 05/31/2012   HCT 33.4* 05/31/2012   MCV 80.1 05/31/2012   PLT 188 05/31/2012    Assessment / Plan: Protracted active phase Severe range B/P:  discusssed with Dr. Macon Large;  Will tx with Labetalol and start MgSO4  Labor: progressing slowly Fetal Wellbeing:  Category I Pain Control:  Epidural Anticipated MOD:  NSVD  CRESENZO-DISHMAN,Henna Derderian 06/02/2012, 2:02 AM

## 2012-06-02 NOTE — Progress Notes (Signed)
   Karina [name] is a 27 y.o. G1P0 at [redacted]w[redacted]d  admitted for induction of labor due to Hypertension.  Subjective: Feeling more pressure.  Denies HA, vision changes  Objective: BP 134/71  Pulse 98  Temp(Src) 98.4 F (36.9 C) (Oral)  Resp 18  Ht 5\' 2"  (1.575 m)  Wt 110.587 kg (243 lb 12.8 oz)  BMI 44.58 kg/m2  SpO2 100%  LMP 09/04/2011 Total I/O In: 507.5 [I.V.:307.5; Other:200] Out: -   FHT:  FHR: 150 bpm, variability: moderate,  accelerations:  Present,  decelerations:  Absent UC:   MVU's ~100 SVE:  10/100/0 Pitocin @ 40 mu/min  Labs: Lab Results  Component Value Date   WBC 7.0 05/31/2012   HGB 11.4* 05/31/2012   HCT 33.4* 05/31/2012   MCV 80.1 05/31/2012   PLT 188 05/31/2012    Assessment / Plan: IOL for GHTN, laboring down; labor inadequate but at maximum Pitocin.  Reluctant to take pit break at this stage  Labor: progressing slowly Fetal Wellbeing:  Category I Pain Control:  Epidural Anticipated MOD:  NSVD  CRESENZO-DISHMAN,Eddye Broxterman 06/02/2012, 5:02 AM

## 2012-06-02 NOTE — Progress Notes (Signed)
Karina [name] is a 27 y.o. 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  Subjective: Doing okay with epidural. Feeling some pressure currently.  Objective: BP 162/102  Pulse 106  Temp(Src) 99.4 F (37.4 C) (Oral)  Resp 18  Ht 5\' 2"  (1.575 m)  Wt 110.587 kg (243 lb 12.8 oz)  BMI 44.58 kg/m2  SpO2 100%  LMP 09/04/2011 I/O last 3 completed shifts: In: -  Out: 2325 [Urine:2325]    FHT:  FHR: 150 bpm, variability: moderate,  accelerations:  Present,  decelerations:  Absent UC:   regular, every 2-3 minutes SVE:   Dilation: 7 Effacement (%): 90 Station: 0 Exam by:: Dr. Tyrone Sage, RN  Labs: Lab Results  Component Value Date   WBC 7.0 05/31/2012   HGB 11.4* 05/31/2012   HCT 33.4* 05/31/2012   MCV 80.1 05/31/2012   PLT 188 05/31/2012    Assessment / Plan: Protracted active phase  Labor: Continuing Pitocin; Patient is hopeful for vaginal delivery. Attending has discussed option of C-section but she would like to continue throughout the night with re-evaluation/reconsideration in the am in no progress. Fetal Wellbeing:  Category I Pain Control:  Epidural Anticipated MOD:  May need C-section if she fails to progress.  Everlene Other 06/02/2012, 1:11 AM

## 2012-06-02 NOTE — Progress Notes (Signed)
Patient ID: Karina [name], female   DOB: 15-Jun-1985, 27 y.o.   MRN: [id number]   Pt pushing for 3 hours. Complete for 8 hours. Feeling pain in vagina and RLQ. Very edematous vaginal/perineal/periurethral tissue and edema in legs and abdomen.   Pitocin at 44. Contractions every 3-4 minutes, moderate intensity.  Filed Vitals:   06/02/12 1103 06/02/12 1132 06/02/12 1202 06/02/12 1233  BP: 134/73 139/77 141/74 117/79  Pulse: 98 101 100 114  Temp:      TempSrc:   Oral   Resp: 20 20 20 20   Height:      Weight:      SpO2:         FHTs:  140, mod to min variability, no accels, possible decels to 120s with pushes but tracing disconnected.  TOCO:  MVUS inadequate 25 per ctx. q 3-4 minute  Cervix:  Complete/+2, molding  Discussed with patient the concern that either baby is too big or pelvis too narrow for delivery. Explained that we are well outside the normal time frame for a successful vaginal delivery. We also explained the concern that with the continued pitocin, pushing for extended time and edema the c-section will be more complicated with potential for bleeding and more tissue injury. The patient refused to proceed to c-section at this time and stated she wanted to push for 2 more hours as long as fetal tracing is reassuring.  Napoleon Form, MD

## 2012-06-02 NOTE — Progress Notes (Signed)
06/02/12 @ 1450 RN notified me by phone regarding glucose levels, the last one being 185 mg/dL.  Pt appropriate for Diabetic Pregnant Order set now that she is not using insulin pump since yesterday.  According to protocol, requested RN to start the IV insulin drip throughout the rest of her labor and pushing.  Noted MD's last note regarding patient's request to wait another 2 hrs before deciding on c/section if needed.  The IV insulin will prevent the excess glucose from circulating to the baby before birth in hopes to decrease potential for immediate hypoglycemia at birth. Please again, call if I can be of help at time of delivery or immediate post-partum. Thank you, Lenor Coffin, RN, CNS, Diabetes Coordinator [phone])

## 2012-06-02 NOTE — Op Note (Signed)
Karina [name]  PROCEDURE DATE: 05/30/2012 - 06/02/2012  PREOPERATIVE DIAGNOSIS: Intrauterine pregnancy at  [redacted]w[redacted]d weeks gestation; failure to descend  POSTOPERATIVE DIAGNOSIS: The same  PROCEDURE: Primary Low Transverse Cesarean Section  SURGEON:  Dr. Catalina Antigua  ASSISTANT:  none   INDICATIONS: Karina [name] is a 27 y.o. G1P1001 at [redacted]w[redacted]d here for cesarean section secondary to the indications listed under preoperative diagnosis; please see preoperative note for further details.  The risks of cesarean section were discussed with the patient including but were not limited to: bleeding which may require transfusion or reoperation; infection which may require antibiotics; injury to bowel, bladder, ureters or other surrounding organs; injury to the fetus; need for additional procedures including hysterectomy in the event of a life-threatening hemorrhage; placental abnormalities wth subsequent pregnancies, incisional problems, thromboembolic phenomenon and other postoperative/anesthesia complications.   The patient concurred with the proposed plan, giving informed written consent for the procedure.    FINDINGS:  Viable female infant in cephalic presentation.  Apgars 7 and 9.  Clear amniotic fluid.  Intact placenta, three vessel cord.  Normal uterus, fallopian tubes and ovaries bilaterally.  ANESTHESIA: Epidural INTRAVENOUS FLUIDS: 1800 ml ESTIMATED BLOOD LOSS: 700 ml URINE OUTPUT:  500 ml, brownish red first 300 ml, then clearer red SPECIMENS: Placenta sent to L&D COMPLICATIONS: None immediate  PROCEDURE IN DETAIL:  The patient preoperatively received intravenous antibiotics and had sequential compression devices applied to her lower extremities.  She was then taken to the operating room where spinal anesthesia was administered and was found to be adequate. She was then placed in a dorsal supine position with a leftward tilt, and prepped and draped in a sterile manner.  A foley catheter was  placed into her bladder and attached to Karina [name] gravity. 300 ml of brownish red urine were immediately emptied into the foley bag. After an adequate timeout was performed, a Pfannenstiel skin incision was made with scalpel and carried through to the underlying layer of fascia. The fascia was incised in the midline, and this incision was extended bilaterally using the Mayo scissors.  Kocher clamps were applied to the superior aspect of the fascial incision and the underlying rectus muscles were dissected off bluntly. A similar process was carried out on the inferior aspect of the fascial incision. The rectus muscles were separated in the midline bluntly and the peritoneum was entered bluntly. A bladder blade was placed. Attention was turned to the lower uterine segment where a low transverse hysterotomy incision was made with a scalpel and extended bilaterally bluntly.  The infant was successfully delivered, the cord was clamped and cut and the infant was handed over to awaiting neonatology team. Uterine massage was then administered, and the placenta delivered intact with a three-vessel cord. The uterus was then cleared of clot and debris.  The hysterotomy was closed with 0 Vicryl in a running locked fashion, and an imbricating layer was also placed with the same suture.  The pelvis was cleared of all clot and debris. Hemostasis was confirmed on all surfaces.  The peritoneum was reapproximated using 0 Vicryl in running stitch The fascia was then closed using 0 Vicryl in a running fashion.  The subcutaneous layer was irrigated, then reapproximated with 2-0 plain suture a interrupted stitches, and the skin was closed with a 4-0 Vicryl subcuticular stitch. The patient tolerated the procedure well. A pressure dressing was applied to the incision.  Sponge, lap, instrument and needle counts were correct x 2.  She was taken to the recovery  room in stable condition. The foley catheter was draining clear red urine by the  end of the surgery.  Napoleon Form, MD 06/02/2012 5:17 PM     I was present for the entire surgical case and agree with above documentation

## 2012-06-03 ENCOUNTER — Inpatient Hospital Stay (HOSPITAL_COMMUNITY): Admission: RE | Admit: 2012-06-03 | Payer: Medicaid Other | Source: Ambulatory Visit

## 2012-06-03 LAB — CBC
HCT: 32.7 % — ABNORMAL LOW (ref 36.0–46.0)
Hemoglobin: 11.4 g/dL — ABNORMAL LOW (ref 12.0–15.0)
MCH: 27.7 pg (ref 26.0–34.0)
MCHC: 34.9 g/dL (ref 30.0–36.0)
MCV: 79.6 fL (ref 78.0–100.0)
Platelets: 202 10*3/uL (ref 150–400)
RBC: 4.11 MIL/uL (ref 3.87–5.11)
RDW: 14.5 % (ref 11.5–15.5)
WBC: 16.9 10*3/uL — ABNORMAL HIGH (ref 4.0–10.5)

## 2012-06-03 LAB — GLUCOSE, CAPILLARY
Glucose-Capillary: 181 mg/dL — ABNORMAL HIGH (ref 70–99)
Glucose-Capillary: 170 mg/dL — ABNORMAL HIGH (ref 70–99)
Glucose-Capillary: 118 mg/dL — ABNORMAL HIGH (ref 70–99)

## 2012-06-03 MED ORDER — INSULIN PUMP
1.0000 | SUBCUTANEOUS | Status: DC
  Administered 2012-06-03: 1 via SUBCUTANEOUS
  Filled 2012-06-03: qty 1

## 2012-06-03 MED ORDER — HYDROCHLOROTHIAZIDE 25 MG PO TABS
25.0000 mg | ORAL_TABLET | Freq: Every day | ORAL | Status: DC
  Administered 2012-06-03 – 2012-06-04 (×2): 25 mg via ORAL
  Filled 2012-06-03 (×4): qty 1

## 2012-06-03 MED ORDER — INSULIN PUMP
1.0000 | SUBCUTANEOUS | Status: DC
  Administered 2012-06-04 – 2012-06-05 (×5): 1 via SUBCUTANEOUS

## 2012-06-03 MED ORDER — AMLODIPINE BESYLATE 5 MG PO TABS
5.0000 mg | ORAL_TABLET | Freq: Every day | ORAL | Status: DC
  Administered 2012-06-03 – 2012-06-04 (×2): 5 mg via ORAL
  Filled 2012-06-03 (×4): qty 1

## 2012-06-03 NOTE — Progress Notes (Signed)
Pt.'s husband wheeled her upstairs to NICU; off Magnesium and stable

## 2012-06-03 NOTE — Anesthesia Postprocedure Evaluation (Signed)
Anesthesia Post Note  Patient: Karina [name]  Procedure(s) Performed: Procedure(s) (LRB): CESAREAN SECTION (N/A)  Anesthesia type: Epidural  Patient location: Mother/Baby  Post pain: Pain level controlled  Post assessment: Post-op Vital signs reviewed  Last Vitals:  Filed Vitals:   06/03/12 0802  BP: 148/119  Pulse: 86  Temp: 36.7 C  Resp: 20    Post vital signs: Reviewed  Level of consciousness: awake  Complications: No apparent anesthesia complications

## 2012-06-03 NOTE — Progress Notes (Signed)
Pt. Up to NICU with AICU nurse to visit baby

## 2012-06-03 NOTE — Progress Notes (Signed)
Subjective: Postpartum Day 1: Cesarean Delivery Patient reports incisional pain, tolerating PO and no problems voiding.    Objective: Vital signs in last 24 hours: Temp:  [97.5 F (36.4 C)-99.4 F (37.4 C)] 98.1 F (36.7 C) (05/24 0802) Pulse Rate:  [77-114] 86 (05/24 0802) Resp:  [12-21] 20 (05/24 0802) BP: (102-173)/(54-119) 148/119 mmHg (05/24 0802) SpO2:  [96 %-100 %] 96 % (05/23 2156)  Physical Exam:  General: alert, cooperative and mild discomfort Lochia: appropriate Uterine Fundus: firm Incision: dressing dry DVT Evaluation: No evidence of DVT seen on physical exam. SCD in place   Recent Labs  06/02/12 1910 06/03/12 0715  HGB 12.2 11.4*  HCT 35.2* 32.7*  CBG up to 150, wants to use pump  Assessment/Plan: Status post Cesarean section. Doing well postoperatively. HTN, Diabetes  Insulin pump HCTZ 25 mg po, Norvasc 5 mg po.  ARNOLD,JAMES 06/03/2012, 9:35 AM

## 2012-06-03 NOTE — Progress Notes (Signed)
Pt. Returned to room from NICU

## 2012-06-03 NOTE — Lactation Note (Signed)
This note was copied from the chart of Karina Britne Borelli. Lactation Consultation Note RN called, reports that mom is requesting LC, states that she is pumping but nothing is coming out.  Initial consultation with this first time mom; baby is admitted to NICU, mom states for low blood sugars. Mom has type 2 diabetes. Discussed DEP and hand expression with mom. Enc mom to continue pumping/ hand expression every 3 hours despite the amount that comes out. Reviewed br feeding basics and pumping strategies. Demonstrated hand expression, mom return demonstration, no colostrum coming out at this time. Enc mom to call for help if needed. Mom states she is now encouraged and will keep trying. Breastfeeding in NICU brochure provided, mom made aware of lactation services and BFSG. Questions answered.   Patient Name: Karina [name] [id number] Date: 06/03/2012 Reason for consult: Initial assessment;NICU baby   Maternal Data Formula Feeding for Exclusion: Yes Reason for exclusion: Admission to Intensive Care Unit (ICU) post-partum Has patient been taught Hand Expression?: Yes Does the patient have breastfeeding experience prior to this delivery?: No  Feeding    LATCH Score/Interventions                      Lactation Tools Discussed/Used     Consult Status Consult Status: Follow-up Follow-up type: In-patient    Octavio Manns [facility] 06/03/2012, 4:24 PM

## 2012-06-03 NOTE — Clinical Social Work Note (Signed)
Clinical Social Work Department PSYCHOSOCIAL ASSESSMENT - MATERNAL/CHILD 06/03/2012  Patient:  Karina, [name]  Account Number:  [id number]  Admit Date:  05/30/2012  Marjo Bicker Name:    Clinical Social Worker:  Truman Hayward, LCSW   Date/Time:  06/03/2012 11:30 AM  Date Referred:  06/03/2012   Referral source  Physician     Referred reason  NICU   Other referral source:    I:  FAMILY / HOME ENVIRONMENT Child's legal guardian:  PARENT  Guardian - Name Guardian - Age Guardian - Address  Karina [name] [address] [place], [place] [address]  Karina [name]  [address] [place], [place] [address]   Other household support members/support persons Name Relationship DOB  none     Other support:   MOB and FOB report good family support in area.    II  PSYCHOSOCIAL DATA Information Source:  Patient Interview  Event organiser Employment:   FOB in school at [employer]  MOB: substitute Scientist, research (life sciences) resources:  Medicaid If Medicaid - [employer]:  [employer] Other  [facility]   School / Grade:   Maternity Care Coordinator / Child Services Coordination / Early Interventions:  Cultural issues impacting care:    III  STRENGTHS Strengths  Compliance with medical plan  Understanding of illness  Adequate Resources  Home prepared for Child (including basic supplies)  Understanding of illness   Strength comment:    IV  RISK FACTORS AND CURRENT PROBLEMS Current Problem:  None   Risk Factor & Current Problem Patient Issue Family Issue Risk Factor / Current Problem Comment   N N     V  SOCIAL WORK ASSESSMENT CSW spoke with MOB and FOB at bedside.  CSW introduced department and discussed offering support to NICU families. CSW discussed infant admission to NICU and illness.  MOB and FOB expressed good communication and knowledge of treatment. CSW discussed emotional stability.  MOB reports appropriate emotion around NICU admission.  No hx of emotional concerns  for MOB in chart.  CSW discussed symptoms of PPD and MOB expressed she had information on this.  CSW discussed supplies and family support.  MOB and FOB expressed good support in the area.  FOB discsused sometimes vistors can be overwhelming as they both have large families.  CSW discussed letting RN or CSW know and we can assist with visitations if needed.  MOB reports this is her first baby and herself and FOB are excited to be parents.  MOB reports no concerns with supplies, however think they may not have all the parts needed for car seat. CSW instructed MOB and FOB to let CSW know if any assistance was needed.  CSW discussed insurance and financial support.  MOB confirmed medicaid and reported additional support of WIC. No hx of SA issues in chart. CSW will continue to offer support while infant in NICU.      VI SOCIAL WORK PLAN Social Work Plan  Psychosocial Support/Ongoing Assessment of Needs   Type of pt/family education:   PPD Depression   If child protective services report - county:   If child protective services report - date:   Information/referral to community resources comment:   Other social work plan:

## 2012-06-04 LAB — GLUCOSE, CAPILLARY
Glucose-Capillary: 72 mg/dL (ref 70–99)
Glucose-Capillary: 137 mg/dL — ABNORMAL HIGH (ref 70–99)

## 2012-06-04 MED ORDER — INSULIN ASPART 100 UNIT/ML ~~LOC~~ SOLN: 0.0000 [IU] | Freq: Three times a day (TID) | SUBCUTANEOUS | Status: DC

## 2012-06-04 MED ORDER — MEASLES, MUMPS & RUBELLA VAC ~~LOC~~ INJ
0.5000 mL | INJECTION | Freq: Once | SUBCUTANEOUS | Status: DC
  Filled 2012-06-04: qty 0.5

## 2012-06-04 MED ORDER — INSULIN ASPART 100 UNIT/ML ~~LOC~~ SOLN: 0.0000 [IU] | Freq: Every day | SUBCUTANEOUS | Status: DC

## 2012-06-04 MED ORDER — SALINE SPRAY 0.65 % NA SOLN
1.0000 | NASAL | Status: DC | PRN
  Administered 2012-06-04: 1 via NASAL
  Filled 2012-06-04: qty 44

## 2012-06-04 NOTE — Lactation Note (Signed)
This note was copied from the chart of Karina [name]. Lactation Consultation Note  Patient Name: Karina Ottilie Wigglesworth [id number] Date: 06/04/2012 Reason for consult: Follow-up assessment   Maternal Data    Feeding   LATCH Score/Interventions                      Lactation Tools Discussed/Used     Consult Status Consult Status: Follow-up Date: 06/05/12 Follow-up type: In-patient  Mom reports that she has pumped 3 times today but is not obtaining any milk. Reassurance given. Encouraged to try to pump 8 times/24 hours to promote milk supply.Has WIC- will call them about pump on Tuesday. Let paperwork with mom for loaner pump from [place]. No questions at present. To call prn   Pamelia Hoit 06/04/2012, 3:01 PM

## 2012-06-04 NOTE — Progress Notes (Signed)
Pt down in NICU visiting baby. Arrived back to floor at 2145. Pt did not order dinner tray until then, unavailable to cover at 1700. When pt arrived back to floor CBG was 137, pt stated she had not eaten since 1430. 2200 time due for insulin pump, pt calculated carbs and self administered. No coverage needed for HS sliding scale. Will continue to monitor pt.Marland KitchenMarland Kitchen

## 2012-06-04 NOTE — Progress Notes (Signed)
Subjective: Postpartum Day 2: Cesarean Delivery Patient reports incisional pain, tolerating PO, + flatus and no problems voiding.  Still has a lot of swelling in legs. Baby in NICU for blood sugar issues but doing better. Pt is pumping q 3 hours but no milk out yet. Has restarted insulin pump at same basal rate with bolus for meals. Has mild constant headache and some bloody discharge from nose.  Objective: Vital signs in last 24 hours: Temp:  [98.1 F (36.7 C)-99.2 F (37.3 C)] 98.7 F (37.1 C) (05/25 0520) Pulse Rate:  [77-100] 100 (05/25 0520) Resp:  [18-24] 20 (05/25 0520) BP: (122-150)/(72-119) 126/84 mmHg (05/25 0520) Weight:  [111.154 kg (245 lb 0.8 oz)] 111.154 kg (245 lb 0.8 oz) (05/25 0520)  Filed Vitals:   06/03/12 2254 06/03/12 2345 06/04/12 0200 06/04/12 0520  BP: 136/73 137/75 150/87 126/84  Pulse: 83 85 84 100  Temp: 99.1 F (37.3 C) 98.2 F (36.8 C) 99.2 F (37.3 C) 98.7 F (37.1 C)  TempSrc: Oral Oral Oral Oral  Resp: 24 20 20 20   Height:      Weight:    111.154 kg (245 lb 0.8 oz)  SpO2:         Physical Exam:  General: alert, cooperative and no distress Lochia: appropriate Uterine Fundus: firm Incision: no significant drainage DVT Evaluation: No evidence of DVT seen on physical exam. Negative Homan's sign. No cords or calf tenderness. 2+ pitting edema of feet, ankles, legs to thighs   Recent Labs  06/02/12 1910 06/03/12 0715  HGB 12.2 11.4*  HCT 35.2* 32.7*   CBG (last 3)   Recent Labs  06/03/12 0910 06/03/12 1310 06/03/12 2257  GLUCAP 179* 170* 118*   Scheduled Meds: . amLODipine  5 mg Oral Daily  . ferrous sulfate  325 mg Oral BID WC  . hydrochlorothiazide  25 mg Oral Daily  . ibuprofen  600 mg Oral Q6H  . insulin aspart  0-15 Units Subcutaneous TID WC  . insulin aspart  0-5 Units Subcutaneous QHS  . insulin pump  1 each Subcutaneous Custom  . prenatal multivitamin  1 tablet Oral Q1200  . senna-docusate  2 tablet Oral QHS  .  simethicone  80 mg Oral TID PC & HS  . TDaP  0.5 mL Intramuscular Once  . [DISCONTINUED] scopolamine  1 patch Transdermal Once   Continuous Infusions: . naLOXone (NARCAN) adult infusion for PRURITIS     PRN Meds:.bisacodyl, dibucaine, diphenhydrAMINE, diphenhydrAMINE, diphenhydrAMINE, diphenhydrAMINE, lanolin, menthol-cetylpyridinium, metoCLOPramide (REGLAN) injection, nalbuphine, nalbuphine, naLOXone (NARCAN) adult infusion for PRURITIS, naloxone, ondansetron (ZOFRAN) IV, ondansetron (ZOFRAN) IV, ondansetron, oxyCODONE-acetaminophen, simethicone, sodium chloride, sodium chloride, sodium phosphate, witch hazel-glycerin, zolpidem  Assessment/Plan: Status post Cesarean section. Doing well postoperatively.  Diabetes:  Using insulin pump, will likely need to decrease basal rate today but will monitor CBGs Chronic HTN/Preeclampsia:  BP controlled on norvasc and HCTZ Lactation consult Plan for discharge tomorrow.  Napoleon Form 06/04/2012, 7:07 AM

## 2012-06-05 ENCOUNTER — Encounter (HOSPITAL_COMMUNITY): Payer: Self-pay | Admitting: Obstetrics and Gynecology

## 2012-06-05 LAB — GLUCOSE, CAPILLARY
Glucose-Capillary: 72 mg/dL (ref 70–99)
Glucose-Capillary: 70 mg/dL (ref 70–99)
Glucose-Capillary: 90 mg/dL (ref 70–99)
Glucose-Capillary: 101 mg/dL — ABNORMAL HIGH (ref 70–99)

## 2012-06-05 MED ORDER — AMLODIPINE BESYLATE 5 MG PO TABS: 5.0000 mg | ORAL_TABLET | Freq: Every day | ORAL | Status: DC

## 2012-06-05 MED ORDER — HYDROCHLOROTHIAZIDE 25 MG PO TABS: 25.0000 mg | ORAL_TABLET | Freq: Every day | ORAL | Status: DC

## 2012-06-05 MED ORDER — IBUPROFEN 600 MG PO TABS: 600.0000 mg | ORAL_TABLET | Freq: Four times a day (QID) | ORAL | Status: DC

## 2012-06-05 MED ORDER — OXYCODONE-ACETAMINOPHEN 5-325 MG PO TABS: 1.0000 | ORAL_TABLET | ORAL | Status: DC | PRN

## 2012-06-05 NOTE — Discharge Summary (Signed)
Obstetric Discharge Summary Reason for Admission: induction of labor Prenatal Procedures: NST Intrapartum Procedures: cesarean: low cervical, transverse Postpartum Procedures: none Complications-Operative and Postpartum: constipation Hemoglobin  Date Value Range Status  06/03/2012 11.4* 12.0 - 15.0 g/dL Final     HCT  Date Value Range Status  06/03/2012 32.7* 36.0 - 46.0 % Final   Scheduled Meds: . amLODipine  5 mg Oral Daily  . ferrous sulfate  325 mg Oral BID WC  . hydrochlorothiazide  25 mg Oral Daily  . ibuprofen  600 mg Oral Q6H  . insulin aspart  0-15 Units Subcutaneous TID WC  . insulin aspart  0-5 Units Subcutaneous QHS  . insulin pump  1 each Subcutaneous Custom  . prenatal multivitamin  1 tablet Oral Q1200  . senna-docusate  2 tablet Oral QHS  . simethicone  80 mg Oral TID PC & HS  . TDaP  0.5 mL Intramuscular Once  . [DISCONTINUED] scopolamine  1 patch Transdermal Once   Continuous Infusions: . naLOXone (NARCAN) adult infusion for PRURITIS     PRN Meds:.bisacodyl, dibucaine, diphenhydrAMINE, diphenhydrAMINE, diphenhydrAMINE, diphenhydrAMINE, lanolin, menthol-cetylpyridinium, metoCLOPramide (REGLAN) injection, nalbuphine, nalbuphine, naLOXone (NARCAN) adult infusion for PRURITIS, naloxone, ondansetron (ZOFRAN) IV, ondansetron (ZOFRAN) IV, ondansetron, oxyCODONE-acetaminophen, simethicone, sodium chloride, sodium chloride, sodium phosphate, witch hazel-glycerin, zolpidem  Physical Exam:  General: alert, cooperative and mild distress Lochia: appropriate Uterine Fundus: firm Incision: dressing intact DVT Evaluation: No evidence of DVT seen on physical exam.  Discharge Diagnoses: Term Pregnancy-delivered and diabetes mellitus, CHTN  Discharge Information: Date: 06/05/2012 Activity: pelvic rest and no  heavy lifting Diet: carb modified  Medications: Ibuprofen, Percocet and insulin Condition: improved Instructions: cesarean delivery Discharge to: home Follow-up  Information   Follow up with [facility] In 2 weeks.      Newborn Data: Live born female  Birth Weight: 7 lb 0.5 oz (3190 g) APGAR: 7, 9  Baby in NICU  ARNOLD,JAMES 06/05/2012, 9:21 AM

## 2012-06-06 ENCOUNTER — Encounter: Payer: Self-pay | Admitting: *Deleted

## 2012-06-09 ENCOUNTER — Telehealth: Payer: Self-pay | Admitting: *Deleted

## 2012-06-09 ENCOUNTER — Encounter: Payer: Self-pay | Admitting: Obstetrics & Gynecology

## 2012-06-09 ENCOUNTER — Ambulatory Visit (INDEPENDENT_AMBULATORY_CARE_PROVIDER_SITE_OTHER): Payer: Medicaid Other | Admitting: Obstetrics & Gynecology

## 2012-06-09 VITALS — BP 139/90 | HR 93 | Temp 98.9°F | Ht 62.0 in | Wt 228.4 lb

## 2012-06-09 DIAGNOSIS — Z09 Encounter for follow-up examination after completed treatment for conditions other than malignant neoplasm: Secondary | ICD-10-CM

## 2012-06-09 MED ORDER — HYDROCODONE-ACETAMINOPHEN 5-325 MG PO TABS: 1.0000 | ORAL_TABLET | Freq: Four times a day (QID) | ORAL | Status: DC | PRN

## 2012-06-09 NOTE — Telephone Encounter (Signed)
  Pump Follow Up Progress Note  06/09/2012  Orders received from MD giving me permission to make insulin pump adjustments for the following patient. Karina [name], Karina [name] Female, 27 y.o., 22-Aug-1985   Reviewed blood glucose logs on 06/09/2012 via: [facility] and found the following:            Hypoglycemia Hyperglycemia Comments  Overnight Period:  YES  BASAL  Pre-Meal:    Breakfast YES  BASAL   Lunch      Supper     Post-Meal: Breakfast      Lunch      Supper     Bedtime:       Comments: Since delivery, patient states multiple problems with hypoglycemia and is not bolusing for meals or she is suspending her pump to prevent BG from dropping. Total daily dose of insulin since delivery averaging around 24 units so plan to re-program pump settings accordingly  Pump Settings: Date: Current Date: 06/09/2012 (settings @ TDD of 24 units)   Basal Rate: Carb Ratio Sensitivity  Basal Rate: Carb Ratio Sensitivity   MN: Pattern A MN: 5.0 17 MN: 0.50 20 60  MN:  1.10 11 A: 6.0       2 PM 1.20                                             Plan: patient to continue using pump and contact me directly if BG below 60 or above 300 mg/dl now that she is no longer pregnant.   Follow up:  Patient to upload to [facility] within 7 days for further review

## 2012-06-09 NOTE — Patient Instructions (Signed)
Return to clinic for any obstetric concerns or go to [facility] for evaluation  

## 2012-06-09 NOTE — Telephone Encounter (Signed)
Pt left message stating that she had C/S on 5/23. Her incision is opening and has some bleeding. I called pt back and asked if she could come to the clinic for evaluation. I asked if she could be here by 1100 and she agreed.

## 2012-06-09 NOTE — Progress Notes (Signed)
OBSTETRIC CLINIC PROGRESS NOTE  History:  27 y.o. G1P1001 here today for incisional check; she underwent PLTCS on 06/02/12.  She reports that incision is open and bleeding. No other concerns.  Baby is doing well, patient is breastfeeding.  The following portions of the patient's history were reviewed and updated as appropriate: allergies, current medications, past family history, past medical history, past social history, past surgical history and problem list.  Review of Systems:  Pertinent items are noted in HPI.  Objective:  Physical Exam BP 139/90  Pulse 93  Temp(Src) 98.9 F (37.2 C) (Oral)  Ht 5\' 2"  (1.575 m)  Wt 228 lb 6.4 oz (103.602 kg)  BMI 41.76 kg/m2  LMP 09/04/2011  Breastfeeding? Yes Gen: NAD Abd: Soft, nontender and nondistended.  Pannus with mild dependent edema, much improved from labor and immediate postpartum period. Pfannenstiel incision examined, steristrips removed and small amount of dried up serosanguinous drainage was noted on the steristrips.  Incision is C/D/I with no erythema, drainage or induration.  Healing well.  Pelvic: Deferred  Assessment & Plan:  Patient is here for incisional check; normally healing incision noted.  Patient reassured.  She will return for her postpartum visit in 5 weeks or earlier if indicated.

## 2012-07-05 ENCOUNTER — Ambulatory Visit (INDEPENDENT_AMBULATORY_CARE_PROVIDER_SITE_OTHER): Payer: Medicaid Other | Admitting: Advanced Practice Midwife

## 2012-07-05 ENCOUNTER — Encounter: Payer: Self-pay | Admitting: Advanced Practice Midwife

## 2012-07-05 VITALS — BP 148/83 | HR 104 | Temp 97.4°F | Ht 61.0 in | Wt 221.3 lb

## 2012-07-05 DIAGNOSIS — O2493 Unspecified diabetes mellitus in the puerperium: Secondary | ICD-10-CM

## 2012-07-05 DIAGNOSIS — O24919 Unspecified diabetes mellitus in pregnancy, unspecified trimester: Secondary | ICD-10-CM

## 2012-07-05 MED ORDER — NORETHINDRONE 0.35 MG PO TABS: 1.0000 | ORAL_TABLET | Freq: Every day | ORAL | Status: DC

## 2012-07-05 MED ORDER — INSULIN ASPART 100 UNIT/ML ~~LOC~~ SOLN: 3.0000 [IU] | Freq: Three times a day (TID) | SUBCUTANEOUS | Status: DC

## 2012-07-05 NOTE — Progress Notes (Signed)
  Subjective:     Karina [name] is a 27 y.o. female who presents for a postpartum visit. She is 5 weeks postpartum following a low cervical transverse Cesarean section. I have fully reviewed the prenatal and intrapartum course. The delivery was at 38.6 gestational weeks. Outcome: primary cesarean section, low transverse incision. Anesthesia: epidural. Postpartum course has been uneventful. Baby's course has been uneventful. Baby is feeding by breast. Bleeding staining only. Bowel function is normal. Bladder function is normal. Patient is not sexually active. Contraception method is oral progesterone-only contraceptive. Postpartum depression screening: negative.  The following portions of the patient's history were reviewed and updated as appropriate: allergies, current medications, past family history, past medical history, past social history, past surgical history and problem list.  Review of Systems Pertinent items are noted in HPI.   Objective:    BP 148/83  Pulse 104  Temp(Src) 97.4 F (36.3 C) (Oral)  Ht 5\' 1"  (1.549 m)  Wt 100.381 kg (221 lb 4.8 oz)  BMI 41.84 kg/m2  Breastfeeding? Yes  General:  alert, cooperative and no distress   Breasts:  inspection negative, no nipple discharge or bleeding, no masses or nodularity palpable  Lungs: clear to auscultation bilaterally  Heart:  regular rate and rhythm, S1, S2 normal, no murmur, click, rub or gallop  Abdomen: soft, non-tender; bowel sounds normal; no masses,  no organomegaly   Vulva:  normal  Vagina: normal vagina  Cervix:  no cervical motion tenderness  Corpus: normal size, contour, position, consistency, mobility, non-tender  Adnexa:  no mass, fullness, tenderness  Rectal Exam: Not performed.        Assessment:     Normal PostOperative/ postpartum exam. Pap smear not done at today's visit.   Plan:    1. Contraception: oral progesterone-only contraceptive 2. Refill done for 3 vials of Novalog insulin for insulin pump.   RD is helping her with pump  Encouraged to engage a primary doctor as we cannot manage insulin long term. Pt agrees 3. Follow up in: 1 year or as needed.

## 2012-07-05 NOTE — Patient Instructions (Signed)
Oral Contraception Use Oral contraceptives (OCs) are medicines taken to prevent pregnancy. OCs work by preventing the ovaries from releasing eggs. The hormones in OCs also cause the cervical mucus to thicken, preventing the sperm from entering the uterus. The hormones also cause the uterine lining to become thin, not allowing a fertilized egg to attach to the inside of the uterus. OCs are highly effective when taken exactly as prescribed. However, OCs do not prevent sexually transmitted diseases (STDs). Safe sex practices, such as using condoms along with an OC, can help prevent STDs.  Before taking OCs, you may have a physical exam and Pap test. Your caregiver may also order blood tests if necessary. Your caregiver will make sure you are a good candidate for oral contraception. Discuss with your caregiver the possible side effects of the OC you may be prescribed. When starting an OC, it can take 2 to 3 months for the body to adjust to the changes in hormone levels in your body.  HOW TO TAKE ORAL CONTRACEPTIVES Your caregiver may advise you on how to start taking the first cycle of OCs. Otherwise, you can:  Start on day 1 of your menstrual period. You will not need any backup contraceptive protection with this start time.  Start on the first Sunday after your menstrual period or the day you get your prescription. In these cases, you will need to use backup contraceptive protection for the first 7-day cycle. After you have started taking OCs:  If you forget to take 1 pill, take it as soon as you remember. Take the next pill at the regular time.  If you miss 2 or more pills, use backup birth control until your next menstrual period starts.  If you use a 28-day pack that contains inactive pills and you miss 1 of the last 7 pills (pills with no hormones), it will not matter. Throw away the rest of the non-hormone pills and start a new pill pack. No matter which day you start the OC, you will always start  a new pack on that same day of the week. Have an extra pack of OCs and a backup contraceptive method available in case you miss some pills or lose your OC pack. HOME CARE INSTRUCTIONS   Do not smoke.  Always use a condom to protect against STDs. OCs do not protect against STDs.  Use a calendar to mark your menstrual period days.  Read the information and directions that come with your OC. Talk to your caregiver if you have questions. SEEK MEDICAL CARE IF:   You develop nausea and vomiting.  You have abnormal vaginal discharge or bleeding.  You develop a rash.  You miss your menstrual period.  You are losing your hair.  You need treatment for mood swings or depression.  You get dizzy when taking the OC.  You develop acne from taking the OC.  You become pregnant. SEEK IMMEDIATE MEDICAL CARE IF:   You develop chest pain.  You develop shortness of breath.  You have an uncontrolled or severe headache.  You develop numbness or slurred speech.  You develop visual problems.  You develop pain, redness, and swelling in the legs. Document Released: 12/17/2010 Document Revised: 03/22/2011 Document Reviewed: 12/17/2010 [facility] Patient Information 2014 [place], [place]. Oral Contraception Information Oral contraceptives (OCs) are medicines taken to prevent pregnancy. OCs work by preventing the ovaries from releasing eggs. The hormones in OCs also cause the cervical mucus to thicken, preventing the sperm from entering the  uterus. The hormones also cause the uterine lining to become thin, not allowing a fertilized egg to attach to the inside of the uterus. OCs are highly effective when taken exactly as prescribed. However, OCs do not prevent sexually transmitted diseases (STDs). Safe sex practices, such as using condoms along with the pill, can help prevent STDs.  Before taking the pill, you may have a physical exam and Pap test. Your caregiver may order blood tests that may be  necessary. Your caregiver will make sure you are a good candidate for oral contraception. Discuss with your caregiver the possible side effects of the OC you may be prescribed. When starting an OC, it can take 2 to 3 months for the body to adjust to the changes in hormone levels in your body.  TYPES OF ORAL CONTRACEPTION  The combination pill. This pill contains estrogen and progestin (synthetic progesterone) hormones. The combination pill comes in either 21-day or 28-day packs. With 21-day packs, you do not take pills for 7 days after the last pill. With 28-day packs, the pill is taken every day. The last 7 pills are without hormones. Certain types of pills have more than 21 hormone-containing pills.  The minipill. This pill contains the progesterone hormone only. It is taken every day continuously. The minipill comes in packs of 91 pills. The first 84 pills contain the hormones, and the last 7 pills do not. The last 7 days are when you will have your menstrual period. You may experience irregular spotting. ADVANTAGES  Decreases premenstrual symptoms.  Treats menstrual period cramps.  Regulates the menstrual cycle.  Decreases a heavy menstrual flow.  Treats acne.  Treats abnormal uterine bleeding.  Treats chronic pelvic pain.  Treats polycystic ovarian syndrome.  Treats endometriosis.  Can be used as emergency contraception. DISADVANTAGES OCs can be less effective if:  You forget to take the pill at the same time every day.  You have a stomach or intestinal disease that lessens the absorption of the pill.  You take OCs with other medicines that make OCs less effective.  You take expired OCs.  You forget to restart the pill on day 7, when using the packs of 21 pills. Document Released: 03/20/2002 Document Revised: 03/22/2011 Document Reviewed: 05/06/2010 [facility] Patient Information 2014 [place], [place].

## 2012-07-06 ENCOUNTER — Telehealth: Payer: Self-pay | Admitting: *Deleted

## 2012-07-06 NOTE — Telephone Encounter (Signed)
  Pump Follow Up Progress Note  06/09/2012  Orders received from MD giving me permission to make insulin pump adjustments for the following patient. [name], Karina Female, 27 y.o., April 17, 1985   Reviewed blood glucose logs on 07/06/2012 via: [facility] and found the following:            Hypoglycemia Hyperglycemia Comments  Overnight Period:   YES   Pre-Meal:    Breakfast   BASAL   Lunch      Supper     Post-Meal: Breakfast  YES Carb Ratio   Lunch  YES Carb Ratio   Supper  YES Carb Ratio  Bedtime:       Comments: Patient reports BG's are increasing as she is not nursing as much as she was. Upon review of [facility] Reports, settings have been changed by patient since last communication and they are not consistent settings. Plan to adjust basal and bolus settings accordingly  Pump Settings: Date: Current Date: 6/26//2014 (settings @ TDD of 100 units)   Basal Rate: Carb Ratio Sensitivity  Basal Rate: Carb Ratio Sensitivity   MN: Pattern A MN: 20 20 MN: 2.00 (-) 5  (+) 20  (=)  MN:  2.85 11 A: 20       2 PM 3.80                                             Plan: patient to continue using pump and contact me directly if BG below 60 or above 300 mg/dl now that she is no longer pregnant.   Follow up:  Patient to upload to [facility] within 7 days for further review

## 2012-07-13 ENCOUNTER — Telehealth: Payer: Self-pay | Admitting: *Deleted

## 2012-07-13 NOTE — Telephone Encounter (Signed)
Karina [name] called and requested a prescription for Plan B emergency contraception- [facility] and a message left for her. Discussed with Dr. Debroah Loop and he states she may have Plan B, but doesn't need a prescription- may get it otc - called patient and informed her may get it otc - but must take within 72 hours.  Patient voices understanding.

## 2012-08-21 ENCOUNTER — Telehealth: Payer: Self-pay | Admitting: *Deleted

## 2012-08-21 DIAGNOSIS — E119 Type 2 diabetes mellitus without complications: Secondary | ICD-10-CM

## 2012-08-21 MED ORDER — INSULIN ASPART 100 UNIT/ML ~~LOC~~ SOLN: 3.0000 [IU] | Freq: Three times a day (TID) | SUBCUTANEOUS | Status: DC

## 2012-08-21 NOTE — Telephone Encounter (Signed)
Discussed with Dr. Jolayne Panther, refill approved and [facility] and left a message refill approved, call if any problems.

## 2012-08-21 NOTE — Telephone Encounter (Signed)
Karina [name] called and left a message stating she needs a refill for her novolog clear insulin 3 vials. States she delivered in May and has been looking for a doctor- has an appointment with an endocrinologist , but it is not soon. Doesn't want to run out of insulin- [facility] [place]

## 2012-09-06 ENCOUNTER — Telehealth: Payer: Self-pay | Admitting: Obstetrics and Gynecology

## 2012-09-06 DIAGNOSIS — E119 Type 2 diabetes mellitus without complications: Secondary | ICD-10-CM

## 2012-09-06 NOTE — Telephone Encounter (Signed)
Patient called wanting to get a referral to internal medicine.

## 2012-09-07 ENCOUNTER — Other Ambulatory Visit: Payer: Self-pay | Admitting: Obstetrics and Gynecology

## 2012-09-07 DIAGNOSIS — E119 Type 2 diabetes mellitus without complications: Secondary | ICD-10-CM

## 2012-09-07 NOTE — Telephone Encounter (Signed)
Called patient letting her know we received her phone call. Patient stated she needed a referral to internal medicine for management of her diabetes. I asked the patient if she had a primary care physician and she said no. Told patient I would send a referral in to [facility] and they will call her with an appt to establish care there and manage her diabetes. Patient verbalized understanding. I asked the patient if she received the insulin she needed and she stated yes but only one vial. Encouraged the patient to call the pharmacy and see if she can get the other vial because when we sent the Rx we wrote for 3 vials. Patient verbalized understanding and had no further questions

## 2012-09-07 NOTE — Addendum Note (Signed)
Addended by: Kathee Delton on: 09/07/2012 04:32 PM   Modules accepted: Orders

## 2012-10-04 ENCOUNTER — Telehealth: Payer: Self-pay | Admitting: *Deleted

## 2012-10-04 NOTE — Telephone Encounter (Signed)
Pt left message stating that she is still waiting for her referral appt to [facility] for diabetes management care. Please call back

## 2012-10-09 NOTE — Telephone Encounter (Signed)
Patient left message that she is still waiting on her referral to [facility]. She is out of her diabetes medicines and needs a refill since she is still waiting on the referral. Also requesting that we mail her the paperwork she needs to fill out for [facility].

## 2012-10-12 NOTE — Telephone Encounter (Signed)
Upon review of chart, the referral order which was made for pt on 09/07/12 was placed in [facility] and [facility] needs a form faxed. I completed the form and faxed to [facility].  We received notification that an appt was made for pt on 11/03/12 and that she had been notified. I called pt to confirm that she had the information needed for the appt and she confirmed that she did. I apologized for the delay in getting her appt and she stated "That's ok I understand".  I also asked if she is still using the insulin pump and had all the insulin needed. She stated that she is using the pump and has 3 vials of insulin. I advised pt to call us if a refill is needed before her appt on 10/24 @ [facility]. Pt voiced understanding.

## 2012-11-03 ENCOUNTER — Ambulatory Visit: Payer: Medicaid Other | Admitting: Family Medicine

## 2012-11-06 ENCOUNTER — Telehealth: Payer: Self-pay | Admitting: *Deleted

## 2012-11-06 DIAGNOSIS — E119 Type 2 diabetes mellitus without complications: Secondary | ICD-10-CM

## 2012-11-06 MED ORDER — INSULIN ASPART 100 UNIT/ML ~~LOC~~ SOLN: 3.0000 [IU] | Freq: Three times a day (TID) | SUBCUTANEOUS | Status: DC

## 2012-11-06 NOTE — Telephone Encounter (Signed)
Patient is requesting a refill on her Novolog. She doesn't have an appt until 11/14 with [facility]. Refill sent in.

## 2012-11-16 ENCOUNTER — Other Ambulatory Visit: Payer: Self-pay

## 2012-11-24 ENCOUNTER — Ambulatory Visit: Payer: Medicaid Other | Admitting: Family Medicine

## 2012-11-27 ENCOUNTER — Encounter (HOSPITAL_COMMUNITY): Payer: Self-pay | Admitting: Emergency Medicine

## 2012-11-27 ENCOUNTER — Emergency Department (HOSPITAL_COMMUNITY)
Admission: EM | Admit: 2012-11-27 | Discharge: 2012-11-27 | Disposition: A | Discharge status: Home / Self Care | Payer: Medicaid Other | Source: Home / Self Care | Attending: Family Medicine | Admitting: Family Medicine

## 2012-11-27 DIAGNOSIS — N939 Abnormal uterine and vaginal bleeding, unspecified: Secondary | ICD-10-CM

## 2012-11-27 DIAGNOSIS — N898 Other specified noninflammatory disorders of vagina: Secondary | ICD-10-CM

## 2012-11-27 LAB — POCT URINALYSIS DIP (DEVICE)
Bilirubin Urine: NEGATIVE
Ketones, ur: NEGATIVE mg/dL
Leukocytes, UA: NEGATIVE
pH: 7.5 (ref 5.0–8.0)

## 2012-11-27 LAB — POCT PREGNANCY, URINE: Preg Test, Ur: NEGATIVE

## 2012-11-27 NOTE — ED Provider Notes (Signed)
Karina [name] is a 27 y.o. female who presents to Urgent Care today for concern for pregnancy. A she is worried that she may be pregnant. It's been 6 weeks since her last period. She is currently having light spotting. She's had 2 negative pregnancy tests at home. She notes that she feels as though she is pregnant. She is very anxious about this as she has had negative home pregnancy test and in fact been pregnant in the past. She would like a serum pregnancy test if possible.    Past Medical History  Diagnosis Date  . Diabetes mellitus     Type 2, insulin resistant   History  Substance Use Topics  . Smoking status: Never Smoker   . Smokeless tobacco: Never Used  . Alcohol Use: No   ROS as above Medications reviewed. No current facility-administered medications for this encounter.   Current Outpatient Prescriptions  Medication Sig Dispense Refill  . amLODipine (NORVASC) 5 MG tablet Take 1 tablet (5 mg total) by mouth daily.  30 tablet  1  . calcium carbonate (TUMS - DOSED IN MG ELEMENTAL CALCIUM) 500 MG chewable tablet Chew 2 tablets by mouth daily as needed for heartburn.      . hydrochlorothiazide (HYDRODIURIL) 25 MG tablet Take 1 tablet (25 mg total) by mouth daily.  30 tablet  1  . HYDROcodone-acetaminophen (NORCO/VICODIN) 5-325 MG per tablet Take 1 tablet by mouth every 6 (six) hours as needed for pain.  60 tablet  0  . ibuprofen (ADVIL,MOTRIN) 600 MG tablet Take 1 tablet (600 mg total) by mouth every 6 (six) hours.  30 tablet  0  . insulin aspart (NOVOLOG) 100 UNIT/ML injection Inject 3 Units into the skin 3 (three) times daily with meals.  3 vial  0  . Insulin Human (INSULIN PUMP) 100 unit/ml SOLN Inject 1 each into the skin continuous. Novolog Insulin.  Inject 2.15 units continuous from 12:00am until 2:00pm.  2:00pm until 12:00am, inject 2.4 units continous.      . norethindrone (ORTHO MICRONOR) 0.35 MG tablet Take 1 tablet (0.35 mg total) by mouth daily.  1 Package  11  .  oxyCODONE-acetaminophen (PERCOCET/ROXICET) 5-325 MG per tablet Take 1-2 tablets by mouth every 4 (four) hours as needed.  30 tablet  0  . Prenatal Vit-Fe Fumarate-FA (PRENATAL MULTIVITAMIN) TABS Take 1 tablet by mouth every evening.        Exam:  BP 135/100  Pulse 90  Temp(Src) 98.3 F (36.8 C) (Oral)  Resp 20  SpO2 100%  LMP 11/24/2012  Breastfeeding? No Gen: Well NAD, obese HEENT: EOMI,  MMM Lungs: CTABL Nl WOB Heart: RRR no MRG Abd: NABS, NT, ND Exts: Non edematous BL  LE, warm and well perfused.   Results for orders placed during the hospital encounter of 11/27/12 (from the past 24 hour(s))  POCT URINALYSIS DIP (DEVICE)     Status: Abnormal   Collection Time    11/27/12  2:50 PM      Result Value Range   Glucose, UA NEGATIVE  NEGATIVE mg/dL   Bilirubin Urine NEGATIVE  NEGATIVE   Ketones, ur NEGATIVE  NEGATIVE mg/dL   Specific Gravity, Urine 1.020  1.005 - 1.030   Hgb urine dipstick LARGE (*) NEGATIVE   pH 7.5  5.0 - 8.0   Protein, ur NEGATIVE  NEGATIVE mg/dL   Urobilinogen, UA 2.0 (*) 0.0 - 1.0 mg/dL   Nitrite NEGATIVE  NEGATIVE   Leukocytes, UA NEGATIVE  NEGATIVE  POCT PREGNANCY,  URINE     Status: None   Collection Time    11/27/12  2:56 PM      Result Value Range   Preg Test, Ur NEGATIVE  NEGATIVE   No results found.  Assessment and Plan: 27 y.o. female with vaginal spotting. Doubtful for pregnancy. We'll perform serum hCG.  Will call patient with result.  Recommend followup with primary care provider.  Discussed warning signs or symptoms. Please see discharge instructions. Patient expresses understanding.      Rodolph Bong, MD 11/27/12 1500

## 2012-11-27 NOTE — ED Notes (Signed)
BHCG 49,973 H.  Message to Dr. [name]. Karina [name] 11/27/2012

## 2012-11-27 NOTE — ED Notes (Signed)
C/o fatigue, nausea, and headaches for two months now. States for treatment she increase insulin dosage

## 2012-11-28 ENCOUNTER — Telehealth (HOSPITAL_COMMUNITY): Payer: Self-pay | Admitting: Family Medicine

## 2012-11-29 NOTE — ED Notes (Signed)
Patient called to discuss NEGATIVE lab values

## 2012-12-04 NOTE — ED Notes (Addendum)
BHCG <1.  Dr. [name] requested I call pt. to notify her that she is not pregnant.  I called and left a message to call. Karina [name] 12/04/2012 Pt. notified.

## 2012-12-13 ENCOUNTER — Emergency Department (HOSPITAL_COMMUNITY)
Admission: EM | Admit: 2012-12-13 | Discharge: 2012-12-13 | Disposition: A | Discharge status: Home / Self Care | Payer: Medicaid Other | Source: Home / Self Care

## 2012-12-26 ENCOUNTER — Encounter (HOSPITAL_COMMUNITY): Payer: Self-pay | Admitting: Emergency Medicine

## 2012-12-26 ENCOUNTER — Emergency Department (INDEPENDENT_AMBULATORY_CARE_PROVIDER_SITE_OTHER)
Admission: EM | Admit: 2012-12-26 | Discharge: 2012-12-26 | Disposition: A | Discharge status: Home / Self Care | Payer: Medicaid Other | Source: Home / Self Care

## 2012-12-26 DIAGNOSIS — Z202 Contact with and (suspected) exposure to infections with a predominantly sexual mode of transmission: Secondary | ICD-10-CM

## 2012-12-26 DIAGNOSIS — J069 Acute upper respiratory infection, unspecified: Secondary | ICD-10-CM

## 2012-12-26 LAB — HIV ANTIBODY (ROUTINE TESTING W REFLEX): HIV: NONREACTIVE

## 2012-12-26 MED ORDER — IPRATROPIUM BROMIDE 0.06 % NA SOLN: 2.0000 | Freq: Four times a day (QID) | NASAL | Status: DC

## 2012-12-26 NOTE — ED Provider Notes (Signed)
CSN: [id number]     Arrival date & time 12/26/12  1015 History   First MD Initiated Contact with Patient 12/26/12 1105     Chief Complaint  Patient presents with  . URI  . Exposure to STD   (Consider location/radiation/quality/duration/timing/severity/associated sxs/prior Treatment) HPI Comments: 27 year old obese female with type 2 diabetes complaining of a cough for one month. She denies PND but does have runny nose and clears her throat frequently. Denies fever, chills or abdominal pain. She has been taking Sudafed without relief.  Requests HIV drawn as she suspects sexual partner has other sex contacts.    Past Medical History  Diagnosis Date  . Diabetes mellitus     Type 2, insulin resistant   Past Surgical History  Procedure Laterality Date  . Cervical biopsy  2004  . Cesarean section N/A 06/02/2012    Procedure: CESAREAN SECTION;  Surgeon: Catalina Antigua, MD;  Location: [facility];  Service: Obstetrics;  Laterality: N/A;   Family History  Problem Relation Age of Onset  . Diabetes Father    History  Substance Use Topics  . Smoking status: Never Smoker   . Smokeless tobacco: Never Used  . Alcohol Use: No   OB History   Grav Para Term Preterm Abortions TAB SAB Ect Mult Living   1 1 1       1      Review of Systems  Constitutional: Negative for fever, activity change, appetite change and fatigue.  HENT: Positive for congestion and rhinorrhea. Negative for ear pain, postnasal drip, sore throat and trouble swallowing.   Eyes: Negative.   Respiratory: Positive for cough. Negative for shortness of breath.   Cardiovascular: Negative.   Gastrointestinal: Negative.   Genitourinary: Negative.   Musculoskeletal: Negative.   Skin: Negative for rash.  Neurological: Negative.     Allergies  Review of patient's allergies indicates no known allergies.  Home Medications   Current Outpatient Rx  Name  Route  Sig  Dispense  Refill  . insulin aspart (NOVOLOG) 100 UNIT/ML  injection   Subcutaneous   Inject 3 Units into the skin 3 (three) times daily with meals.   3 vial   0     She is on an insulin pump. Dosage varies. She may  ...   . amLODipine (NORVASC) 5 MG tablet   Oral   Take 1 tablet (5 mg total) by mouth daily.   30 tablet   1   . calcium carbonate (TUMS - DOSED IN MG ELEMENTAL CALCIUM) 500 MG chewable tablet   Oral   Chew 2 tablets by mouth daily as needed for heartburn.         . hydrochlorothiazide (HYDRODIURIL) 25 MG tablet   Oral   Take 1 tablet (25 mg total) by mouth daily.   30 tablet   1   . HYDROcodone-acetaminophen (NORCO/VICODIN) 5-325 MG per tablet   Oral   Take 1 tablet by mouth every 6 (six) hours as needed for pain.   60 tablet   0   . ibuprofen (ADVIL,MOTRIN) 600 MG tablet   Oral   Take 1 tablet (600 mg total) by mouth every 6 (six) hours.   30 tablet   0   . Insulin Human (INSULIN PUMP) 100 unit/ml SOLN   Subcutaneous   Inject 1 each into the skin continuous. Novolog Insulin.  Inject 2.15 units continuous from 12:00am until 2:00pm.  2:00pm until 12:00am, inject 2.4 units continous.         [name]  ipratropium (ATROVENT) 0.06 % nasal spray   Each Nare   Place 2 sprays into both nostrils 4 (four) times daily.   15 mL   12   . norethindrone (ORTHO MICRONOR) 0.35 MG tablet   Oral   Take 1 tablet (0.35 mg total) by mouth daily.   1 Package   11   . oxyCODONE-acetaminophen (PERCOCET/ROXICET) 5-325 MG per tablet   Oral   Take 1-2 tablets by mouth every 4 (four) hours as needed.   30 tablet   0   . Prenatal Vit-Fe Fumarate-FA (PRENATAL MULTIVITAMIN) TABS   Oral   Take 1 tablet by mouth every evening.          BP 131/64  Pulse 88  Temp(Src) 98.3 F (36.8 C) (Oral)  Resp 16  SpO2 98%  LMP 11/24/2012  Breastfeeding? No Physical Exam  Nursing note and vitals reviewed. Constitutional: She is oriented to person, place, and time. She appears well-developed and well-nourished. No distress.  HENT:   Right Ear: External ear normal.  Left Ear: External ear normal.  OP pink with clear PND  Eyes: Conjunctivae and EOM are normal.  Neck: Normal range of motion. Neck supple.  Cardiovascular: Normal rate, regular rhythm and normal heart sounds.   Pulmonary/Chest: Effort normal and breath sounds normal. No respiratory distress. She has no wheezes. She has no rales.  Musculoskeletal: Normal range of motion. She exhibits no edema.  Lymphadenopathy:    She has no cervical adenopathy.  Neurological: She is alert and oriented to person, place, and time.  Skin: Skin is warm and dry. No rash noted.  Psychiatric: She has a normal mood and affect.    ED Course  Procedures (including critical care time) Labs Review Labs Reviewed  HIV ANTIBODY (ROUTINE TESTING)   Imaging Review No results found.    MDM   1. URI (upper respiratory infection)   2. Possible exposure to STD     Alka Seltzer Cold Plus and Robitussin DM  Or Allegra 180 mg q d for [employer] of fluids Atrovent NS F/U with your doctor on [facility] card or Health Dept.  Hayden Rasmussen, NP 12/26/12 1126

## 2012-12-26 NOTE — ED Notes (Signed)
Call back number verified.  

## 2012-12-26 NOTE — ED Notes (Signed)
Pt c/o cold sxs onset 1 month... sxs include: productive cough, PND, runny nose She denies: f/v/n/d, wheezing, SOB Also wants to be screened for STD She is alert w/no signs of acute distress.

## 2012-12-26 NOTE — ED Provider Notes (Signed)
Medical screening examination/treatment/procedure(s) were performed by resident physician or non-physician practitioner and as supervising physician I was immediately available for consultation/collaboration.   Amoura Ransier DOUGLAS MD.   Mitsuru Dault D Hira Trent, MD 12/26/12 1224 

## 2012-12-27 ENCOUNTER — Encounter: Payer: Self-pay | Admitting: Family Medicine

## 2012-12-27 ENCOUNTER — Ambulatory Visit (INDEPENDENT_AMBULATORY_CARE_PROVIDER_SITE_OTHER): Payer: Medicaid Other | Admitting: Family Medicine

## 2012-12-27 VITALS — BP 129/91 | HR 88 | Temp 99.0°F | Ht 62.0 in | Wt 238.4 lb

## 2012-12-27 DIAGNOSIS — F32A Depression, unspecified: Secondary | ICD-10-CM | POA: Insufficient documentation

## 2012-12-27 DIAGNOSIS — E113313 Type 2 diabetes mellitus with moderate nonproliferative diabetic retinopathy with macular edema, bilateral: Secondary | ICD-10-CM

## 2012-12-27 DIAGNOSIS — Z794 Long term (current) use of insulin: Secondary | ICD-10-CM | POA: Insufficient documentation

## 2012-12-27 DIAGNOSIS — Z3009 Encounter for other general counseling and advice on contraception: Secondary | ICD-10-CM

## 2012-12-27 DIAGNOSIS — F329 Major depressive disorder, single episode, unspecified: Secondary | ICD-10-CM

## 2012-12-27 DIAGNOSIS — E119 Type 2 diabetes mellitus without complications: Secondary | ICD-10-CM

## 2012-12-27 HISTORY — DX: Type 2 diabetes mellitus with moderate nonproliferative diabetic retinopathy with macular edema, bilateral: E11.3313

## 2012-12-27 HISTORY — DX: Depression, unspecified: F32.A

## 2012-12-27 HISTORY — DX: Long term (current) use of insulin: Z79.4

## 2012-12-27 LAB — CBC WITH DIFFERENTIAL/PLATELET
HCT: 37.9 % (ref 36.0–46.0)
Hemoglobin: 12.7 g/dL (ref 12.0–15.0)
Lymphocytes Relative: 45 % (ref 12–46)
Lymphs Abs: 2 10*3/uL (ref 0.7–4.0)
MCH: 27.1 pg (ref 26.0–34.0)
Monocytes Absolute: 0.2 10*3/uL (ref 0.1–1.0)
Monocytes Relative: 5 % (ref 3–12)
Neutro Abs: 2.1 10*3/uL (ref 1.7–7.7)
Neutrophils Relative %: 47 % (ref 43–77)
RBC: 4.69 MIL/uL (ref 3.87–5.11)
WBC: 4.4 10*3/uL (ref 4.0–10.5)

## 2012-12-27 LAB — BASIC METABOLIC PANEL
Calcium: 9.4 mg/dL (ref 8.4–10.5)
Chloride: 99 mEq/L (ref 96–112)
Creat: 0.94 mg/dL (ref 0.50–1.10)
Potassium: 4.1 mEq/L (ref 3.5–5.3)

## 2012-12-27 LAB — POCT GLYCOSYLATED HEMOGLOBIN (HGB A1C): Hemoglobin A1C: 11.6

## 2012-12-27 MED ORDER — INSULIN PUMP: SUBCUTANEOUS | Status: DC

## 2012-12-27 NOTE — Assessment & Plan Note (Addendum)
Hemoglobin A1c today is 11.6. Up with Dr. Raymondo Band and he is going to attempt to call Dr. Evlyn Kanner to see if she would be willing to take this patient d/t insulin pump Call in refills today for her pump Have asked her to make appointment with Arlys John diabetes educator CBC and BMP today; will  need to obtain fasting lipids in the future. Followup in 2-3 weeks

## 2012-12-27 NOTE — Assessment & Plan Note (Signed)
Gen. discussion of contraception methods available. Patient  seems interested in the nexplanon implant.  She would like to have her women's health screenings completed at this clinic. Last Pap in the system is 2010 and was negative.

## 2012-12-27 NOTE — Patient Instructions (Signed)
Diabetes and Exercise Exercising regularly is important. It is not just about losing weight. It has many health benefits, such as:  Improving your overall fitness, flexibility, and endurance.  Increasing your bone density.  Helping with weight control.  Decreasing your body fat.  Increasing your muscle strength.  Reducing stress and tension.  Improving your overall health. People with diabetes who exercise gain additional benefits because exercise:  Reduces appetite.  Improves the body's use of blood sugar (glucose).  Helps lower or control blood glucose.  Decreases blood pressure.  Helps control blood lipids (such as cholesterol and triglycerides).  Improves the body's use of the hormone insulin by:  Increasing the body's insulin sensitivity.  Reducing the body's insulin needs.  Decreases the risk for heart disease because exercising:  Lowers cholesterol and triglycerides levels.  Increases the levels of good cholesterol (such as high-density lipoproteins [HDL]) in the body.  Lowers blood glucose levels. YOUR ACTIVITY PLAN  Choose an activity that you enjoy and set realistic goals. Your health care provider or diabetes educator can help you make an activity plan that works for you. You can break activities into 2 or 3 sessions throughout the day. Doing so is as good as one long session. Exercise ideas include:  Taking the dog for a walk.  Taking the stairs instead of the elevator.  Dancing to your favorite song.  Doing your favorite exercise with a friend. RECOMMENDATIONS FOR EXERCISING WITH TYPE 1 OR TYPE 2 DIABETES   Check your blood glucose before exercising. If blood glucose levels are greater than 240 mg/dL, check for urine ketones. Do not exercise if ketones are present.  Avoid injecting insulin into areas of the body that are going to be exercised. For example, avoid injecting insulin into:  The arms when playing tennis.  The legs when  jogging.  Keep a record of:  Food intake before and after you exercise.  Expected peak times of insulin action.  Blood glucose levels before and after you exercise.  The type and amount of exercise you have done.  Review your records with your health care provider. Your health care provider will help you to develop guidelines for adjusting food intake and insulin amounts before and after exercising.  If you take insulin or oral hypoglycemic agents, watch for signs and symptoms of hypoglycemia. They include:  Dizziness.  Shaking.  Sweating.  Chills.  Confusion.  Drink plenty of water while you exercise to prevent dehydration or heat stroke. Body water is lost during exercise and must be replaced.  Talk to your health care provider before starting an exercise program to make sure it is safe for you. Remember, almost any type of activity is better than none. Document Released: 03/20/2003 Document Revised: 08/30/2012 Document Reviewed: 06/06/2012 [facility] Patient Information 2014 [place], [place].   Please make an appointment with Dr. Raymondo Band and Arlys John on your way out Make an appointment with me for 3 weeks or sooner if you need to talk about depression sooner. Will call you later with what to set your pump on I would like to perform some basic lab work today as well I will call you with results.

## 2012-12-27 NOTE — Progress Notes (Addendum)
   Subjective:    Patient ID: Karina [name], female    DOB: June 30, 1985, 27 y.o.   MRN: [id number]  HPI  Diabetic: Patient was diagnosed with diabetes in 2004 type 2. She is Insulin dependent. She states she Use to take metformin, Lantus and Novolog. She Use to be seen by Dr. Lucianne Muss and then [facility] after she lost her insurance, since has fallen through the system until she became pregnant. Had gestational hypertension and medicated during pregnancy, resolved after.  Was told she had something wrong with her kidneys during delivery, but is uncertain what. She is  not breast feeding. She has an insulin pump. She states that she was in touch with the diabetes educator that was a took her readings online. She's interested in keeping the pump because she feels that she is better controlled. Over she's willing to go to injections if need be. She is in need of medication refills today. She reports she had her retina scan at her local optometrist. She'll have these records faxed to [place]. She denies nonhealing lesions or sores on her feet. She admits to numbness and tingling in her index fingers along with her middle fingers bilaterally. She does experience some burning the anterior portion of bilateral feet. She doesn't report having any feelings of hypo-glycemia. She notes her sugars have been normal recently by the way she feels. [email or web address] website. CBG: 230-240 fasting, after eating 160, after dinner 320-340 Basal: 2 units/hr; after 3 pm 3 units per hour.  Bolus during eating  Carb counting: every 15 g of carbs =  5 units; calculates dosage per CBG.  Sensitivity 20 Target 95-120  Depression: Patient admits to possibly some depression on screening. She is going through a separation. She had a baby in May. She's not certain if she wants to start medication at this time or attempt to just talk it out with someone. She denies SI or HI. She admits to sadness and stress units in  the past 2 weeks she has little interest or pleasure in doing things and she is feeling down depressed or hopeless. She is having trouble sleeping.  Contraception management: Patient had a baby in May of this year. As her only child. She is married currently going through a separation. She is interested in possibly having children in the future but nothing in the near future. She is no longer breast-feeding. She's not interested in anything that can make her gain weight. Patient's last menstrual period was 11/24/2012.   Social: Never alcohol use. No drug use. Going through separation with her husband, does not always feel safe around him.  Review of Systems Positive for numbness in extremities, sadness, stress, excessive thirst, frequent urination  Objective:   Physical Exam Gen: NAD. Pleasant HEENT: AT. [place]. Bilateral TM visualized and normal in appearance. Bilateral eyes without injections or icterus. MMM. Bilateral nares mild erythema. Throat without erythema or exudates.  CV: RRR  Chest: CTAB, no wheeze or crackles Abd: Soft.. NTND. BS present. No Masses palpated.  Ext: No erythema. No edema.  Skin: No rashes, purpura or petechiae.  Neuro:  Normal gait. PERLA. EOMi. Alert. Grossly intact.  Psych: Appropriate dress and affect. No SI or HI appreciated. Patient seems to be trying to adjust to situation.

## 2012-12-27 NOTE — Assessment & Plan Note (Signed)
Given today's conversation patient is going to think about what she would like to do about her feelings of possible depression. At this time she is uncertain if she wants counseling or medication but does not feel it is urgent to complete today. Patient to make an appointment as soon as possible at her convenience to talk about her plan for depression. No SI or HI.

## 2012-12-29 ENCOUNTER — Telehealth: Payer: Self-pay | Admitting: Family Medicine

## 2012-12-29 NOTE — Telephone Encounter (Signed)
Please call pt and tell her we have been able to get her seen with a Dr. Claiborne Billings has more experience with insulin pumps (since our clinic is not equipped yet). I will  Be discussing this with the pharmacist on Monday and will attempt to get in touch with her Monday afternoon again. Also, her lab work, including her kidneys, looked good. The only concern is her glucose was elevated (~250). She needs to watch her diet closely and make corrections has prior until she hears from [place] next week. Thanks.

## 2012-12-29 NOTE — Telephone Encounter (Signed)
Related message,patient voiced understanding. Karina [name] S  

## 2013-01-17 ENCOUNTER — Ambulatory Visit (INDEPENDENT_AMBULATORY_CARE_PROVIDER_SITE_OTHER): Payer: Medicaid Other | Admitting: Family Medicine

## 2013-01-17 ENCOUNTER — Encounter: Payer: Self-pay | Admitting: Family Medicine

## 2013-01-17 VITALS — BP 136/73 | HR 95 | Temp 99.8°F | Ht 62.0 in | Wt 241.0 lb

## 2013-01-17 DIAGNOSIS — B373 Candidiasis of vulva and vagina: Secondary | ICD-10-CM | POA: Insufficient documentation

## 2013-01-17 DIAGNOSIS — B3731 Acute candidiasis of vulva and vagina: Secondary | ICD-10-CM

## 2013-01-17 DIAGNOSIS — E119 Type 2 diabetes mellitus without complications: Secondary | ICD-10-CM

## 2013-01-17 DIAGNOSIS — B351 Tinea unguium: Secondary | ICD-10-CM | POA: Insufficient documentation

## 2013-01-17 MED ORDER — FLUCONAZOLE 150 MG PO TABS: 150.0000 mg | ORAL_TABLET | Freq: Once | ORAL | Status: DC

## 2013-01-17 NOTE — Progress Notes (Signed)
   Subjective:    Patient ID: Karina [name], female    DOB: 03/14/1985, 28 y.o.   MRN: [id number]  HPI  Diabetes: Patient here for diabetes followup. Last A1c was 11.6 in December. Patient is currently with an insulin pump since her last delivery. She has a history of being difficult to control her blood sugars. At different points in time she's been on metformin, Lantus, NovoLog, 70/30 and now insulin pump. She was followed by Karina [name] until she lost her insurance. She has been without decent control since her delivery. She lost her insurance and has been unable to find a new provider until mid-December. Karina [name] has been attempting to get her set up with Karina [name] which manages his insulin pump. Patient would prefer to keep her pump as opposed to attempt injections again. States she was having to inject herself 5-6 times a day and metformin has always made her have GI issues. Patient has recently had diabetic retinopathy exam, she will have those results faxed to us.  Diabetic neuropathy: Patient reports no numbness and tingling in her fingertips or toes. She feels overall her sensation in her feet her good. She denies any open or nonhealing wounds. She does endorse burning on the top of her feet/toes.   Yeast infection: Patient stops me in the hall in between patients scheduled after her. She states she wanted to talk about her yeast infection at that time. She does have out of control sugars and has had many yeast infections in the past due to her poor sugar control. She has taken Diflucan in the past for her infections. Review of Systems Negative, with the exception of above mentioned in HPI     Objective:   Physical Exam Gen: NAD. Pleasant, obese African American female CV: RRR  Chest: CTAB, no wheeze or crackles Abd: Soft. . NTND. BS present. Obese Ext: No erythema. No edema.  Foot exam: Exam normal today. The exception of bilateral great toe nail dystrophy. Nails are thickened.  Discolored pale white yellow. 1 CM of clear growth at the base.

## 2013-01-17 NOTE — Assessment & Plan Note (Signed)
Patient will followup with Dr. Valentina Lucks for insulin pump management. She has an appointment with him tomorrow and she was able to meet with him briefly today. She met with Vinnie Level after our appointment today as well.  Foot exam completed today and was normal outside of both great toes with possible fungus infection. Patient not interested in treatment yet but can discuss in the future.

## 2013-01-17 NOTE — Assessment & Plan Note (Signed)
Patient with bilateral great toe nail dystrophy. Appears to be possibly fungus in nature. She has a bout a 1 CM clear growth area on each toe. Opting not to treat for now but may need how discussion of treatment at a later date.

## 2013-01-17 NOTE — Assessment & Plan Note (Signed)
After appointment patient wanted to discuss her yeast  Infection. I did not have time to address that in between other patients. You told her I would call in a one-time prescription for Diflucan that A. she has further symptoms and or further yeast infections in the future she will need to be tested with a wet prep prior to Diflucan administration.

## 2013-01-17 NOTE — Progress Notes (Signed)
Patient Identified Concern:  Weight loss, dm control Stage of Change Patient Is In:  Contemplation- planning on making changes within the next 6 months Patient Reported Barriers:  Stopped counting carbs, new baby,reconsiling relationship with husband. Patient Reported Perceived Benefits:  Feeling better, living a healthier longer life for daughter Patient Reports Self-Efficacy:   Pt self-reports high self-efficacy, has had success in the past with carb counting and dm control. Behavior Change Supports:  Husband, parents Goals:  Is currently doing a fast with church through January 2015.  Pt is eliminating breads, sweets, fried foods, and meat. Goal is to plan menu for next 2 weeks and do shopping so that the foods she wants are in the house.  Will call and follow up with health coach in 2-3 weeks.  Patient Education:  We briefly reviewed carb counting, portion sizes, eating at least 3 meals a day,and the the function of insulin in the body.

## 2013-01-17 NOTE — Patient Instructions (Signed)
It was a pleasure seeing you again today. I am going to schedule an appointment with our pharmacist and he is going to work with you with your insulin. We are also going to attempt to get you into a clinic that we'll be able to manage you with an insulin pump for your diabetes.   Diabetes and Foot Care Diabetes may cause you to have problems because of poor blood supply (circulation) to your feet and legs. This may cause the skin on your feet to become thinner, break easier, and heal more slowly. Your skin may become dry, and the skin may peel and crack. You may also have nerve damage in your legs and feet causing decreased feeling in them. You may not notice minor injuries to your feet that could lead to infections or more serious problems. Taking care of your feet is one of the most important things you can do for yourself.  HOME CARE INSTRUCTIONS  Wear shoes at all times, even in the house. Do not go barefoot. Bare feet are easily injured.  Check your feet daily for blisters, cuts, and redness. If you cannot see the bottom of your feet, use a mirror or ask someone for help.  Wash your feet with warm water (do not use hot water) and mild soap. Then pat your feet and the areas between your toes until they are completely dry. Do not soak your feet as this can dry your skin.  Apply a moisturizing lotion or petroleum jelly (that does not contain alcohol and is unscented) to the skin on your feet and to dry, brittle toenails. Do not apply lotion between your toes.  Trim your toenails straight across. Do not dig under them or around the cuticle. File the edges of your nails with an emery board or nail file.  Do not cut corns or calluses or try to remove them with medicine.  Wear clean socks or stockings every day. Make sure they are not too tight. Do not wear knee-high stockings since they may decrease blood flow to your legs.  Wear shoes that fit properly and have enough cushioning. To break in new  shoes, wear them for just a few hours a day. This prevents you from injuring your feet. Always look in your shoes before you put them on to be sure there are no objects inside.  Do not cross your legs. This may decrease the blood flow to your feet.  If you find a minor scrape, cut, or break in the skin on your feet, keep it and the skin around it clean and dry. These areas may be cleansed with mild soap and water. Do not cleanse the area with peroxide, alcohol, or iodine.  When you remove an adhesive bandage, be sure not to damage the skin around it.  If you have a wound, look at it several times a day to make sure it is healing.  Do not use heating pads or hot water bottles. They may burn your skin. If you have lost feeling in your feet or legs, you may not know it is happening until it is too late.  Make sure your health care provider performs a complete foot exam at least annually or more often if you have foot problems. Report any cuts, sores, or bruises to your health care provider immediately. SEEK MEDICAL CARE IF:   You have an injury that is not healing.  You have cuts or breaks in the skin.  You have an  ingrown nail.  You notice redness on your legs or feet.  You feel burning or tingling in your legs or feet.  You have pain or cramps in your legs and feet.  Your legs or feet are numb.  Your feet always feel cold. SEEK IMMEDIATE MEDICAL CARE IF:   There is increasing redness, swelling, or pain in or around a wound.  There is a red line that goes up your leg.  Pus is coming from a wound.  You develop a fever or as directed by your health care provider.  You notice a bad smell coming from an ulcer or wound. Document Released: 12/26/1999 Document Revised: 08/30/2012 Document Reviewed: 06/06/2012 [facility] Patient Information 2014 [place], [place].

## 2013-01-18 ENCOUNTER — Ambulatory Visit (INDEPENDENT_AMBULATORY_CARE_PROVIDER_SITE_OTHER): Payer: Medicaid Other | Admitting: Pharmacist

## 2013-01-18 ENCOUNTER — Ambulatory Visit: Payer: Medicaid Other | Admitting: Pharmacist

## 2013-01-18 ENCOUNTER — Encounter: Payer: Self-pay | Admitting: Pharmacist

## 2013-01-18 VITALS — BP 142/81 | HR 93 | Ht 62.0 in | Wt 244.0 lb

## 2013-01-18 DIAGNOSIS — E119 Type 2 diabetes mellitus without complications: Secondary | ICD-10-CM

## 2013-01-18 MED ORDER — INSULIN PEN NEEDLE 31G X 5 MM MISC: 1.0000 | Freq: Two times a day (BID) | Status: DC

## 2013-01-18 MED ORDER — EXENATIDE 5 MCG/0.02ML ~~LOC~~ SOPN: 5.0000 ug | PEN_INJECTOR | Freq: Two times a day (BID) | SUBCUTANEOUS | Status: DC

## 2013-01-18 NOTE — Patient Instructions (Signed)
Thank you for coming in to see us today!  Will add Byetta 5 mcg twice daily with meals today.  Prescription sent to your pharmacy, along with your pen needles.  Continue with your insulin pump with the Novolog.  We will see you back in about a month for follow-up in RX clinic.

## 2013-01-18 NOTE — Progress Notes (Signed)
S:    Patient arrives in good spirits and presents for diabetes management today.  Patient reports having long standing history of Diabetes.  Patient is currently on an insulin pump with Novolog.  O:  . Lab Results  Component Value Date   HGBA1C 11.6 12/27/2012     Home CBG readings:  Highest BG: 265  Lowest BG: 141 Average BG 180-190  A/P: Long standing Diabetes currently uncontrolled.   Patient denies hypoglycemic events and is able to verbalize appropriate hypoglycemia management plan.  Patient reports adherence with medication, and expresses interest in discussing other medication therapy options for diabetes.  Patient also does not want therapy with multiple shots a day and interested in weight loss.  Patient is also on a diet with her church where she is cutting sweets for a week, meats for a week, and other food options.  She also expressed that she may continue with protein intake despite the diet.  Patient is not meeting goals for blood glucose control at this time. Control is suboptimal due to pregnancy this past year and stress at home. Will continue patient's insulin pump with rapid insulin Novolog (insulin aspart).  Will add Byetta 5 mcg twice daily with meals to patient's regimen.  Patient was also educated on how Byetta works and common side effects.  Written patient instructions provided.  Follow up in Pharmacist Clinic Visit in about a month.   Total time in face to face counseling 35 minutes.  Patient seen with Claudie LeachLauren Gray, PharmD Candidate and Anabel BeneSendra Yang, PhamD - Resident.

## 2013-01-18 NOTE — Assessment & Plan Note (Signed)
Long standing Diabetes currently uncontrolled.   Patient denies hypoglycemic events and is able to verbalize appropriate hypoglycemia management plan.  Patient reports adherence with medication, and expresses interest in discussing other medication therapy options for diabetes.  Patient also does not want therapy with multiple shots a day and interested in weight loss.  Patient is also on a diet with her church where she is cutting sweets for a week, meats for a week, and other food options.  She also expressed that she may continue with protein intake despite the diet.  Patient is not meeting goals for blood glucose control at this time. Control is suboptimal due to pregnancy this past year and stress at home. Will continue patient's insulin pump with rapid insulin Novolog (insulin aspart).  Will add Byetta 5 mcg twice daily with meals to patient's regimen.  Patient was also educated on how Byetta works and common side effects.  Written patient instructions provided.  Follow up in Pharmacist Clinic Visit in about a month.   Total time in face to face counseling 35 minutes.  Patient seen with Claudie LeachLauren Gray, PharmD Candidate and Anabel BeneSendra Yang, PhamD - Resident.

## 2013-01-19 ENCOUNTER — Ambulatory Visit: Payer: Medicaid Other | Admitting: Home Health Services

## 2013-01-19 NOTE — Progress Notes (Signed)
Patient ID: Karina [name], female   DOB: 05/19/1985, 27 y.o.   MRN: [id number] Reviewed: Agree with Dr. Koval's documentation and management. 

## 2013-01-22 ENCOUNTER — Telehealth: Payer: Self-pay | Admitting: *Deleted

## 2013-01-22 NOTE — Telephone Encounter (Signed)
Prior authorization form for Byetta placed in MD box for completion. [facility].

## 2013-01-22 NOTE — Telephone Encounter (Signed)
Completed PA info in [employer] for Byetta.  Status pending.  Will recheck status in 24 hours.  Gaylene Brooksichardson, Jeannette Ann, RN

## 2013-01-22 NOTE — Telephone Encounter (Signed)
Completed forms today and [facility].

## 2013-01-23 NOTE — Telephone Encounter (Signed)
Prior approval for Byetta completed via [employer].  Med approved for 01/22/13 - 01/22/14  Prior approval # [id number].  [facility] pharmacy informed.  Gaylene Brooksichardson, Christinia Lambeth Ann, RN

## 2013-01-31 ENCOUNTER — Ambulatory Visit: Payer: Medicaid Other | Admitting: Family Medicine

## 2013-02-05 ENCOUNTER — Telehealth: Payer: Self-pay | Admitting: Family Medicine

## 2013-02-05 NOTE — Telephone Encounter (Signed)
Please call Ms. Woods and give her the following contact  Information. This particular doctor is willing to manage her insulin pump and diabetes. She should be encourage to make an appointment ASAP, his vacancy may fill. Thanks.   Dr. Alysia PennaScott Holwerda [facility] [address] [place], [place] [id number] [phone]

## 2013-02-06 NOTE — Telephone Encounter (Signed)
Left detailed message on patients voicemail. Karina [name] S  

## 2013-02-08 ENCOUNTER — Ambulatory Visit: Payer: Medicaid Other | Admitting: Family Medicine

## 2013-02-20 ENCOUNTER — Ambulatory Visit: Payer: Medicaid Other | Admitting: Pharmacist

## 2013-03-01 ENCOUNTER — Telehealth: Payer: Self-pay | Admitting: Family Medicine

## 2013-03-01 NOTE — Telephone Encounter (Signed)
[facility] and wanted to know if Dr. Claiborne BillingsKuneff had faxed back the forms they sent over so that Karina [name] could get her diabetic supplies. Please call [phone]. jw

## 2013-03-01 NOTE — Telephone Encounter (Signed)
Please advise. Karina [name]  

## 2013-03-02 NOTE — Telephone Encounter (Signed)
This has already been completed, Wednesday. Of note, this pt is receiving diabetic care now at another facility and diabetes supplies should be forwarded to them. Thanks.

## 2013-03-13 ENCOUNTER — Ambulatory Visit: Payer: Medicaid Other | Admitting: Pharmacist

## 2013-04-09 ENCOUNTER — Emergency Department (HOSPITAL_COMMUNITY)
Admission: AD | Admit: 2013-04-09 | Discharge: 2013-04-09 | Disposition: A | Discharge status: Home / Self Care | Payer: Medicaid Other | Source: Ambulatory Visit | Attending: Emergency Medicine | Admitting: Emergency Medicine

## 2013-04-09 ENCOUNTER — Encounter (HOSPITAL_COMMUNITY): Payer: Self-pay | Admitting: *Deleted

## 2013-04-09 DIAGNOSIS — Z3202 Encounter for pregnancy test, result negative: Secondary | ICD-10-CM | POA: Insufficient documentation

## 2013-04-09 DIAGNOSIS — Z794 Long term (current) use of insulin: Secondary | ICD-10-CM | POA: Insufficient documentation

## 2013-04-09 DIAGNOSIS — E1140 Type 2 diabetes mellitus with diabetic neuropathy, unspecified: Secondary | ICD-10-CM

## 2013-04-09 DIAGNOSIS — E1165 Type 2 diabetes mellitus with hyperglycemia: Secondary | ICD-10-CM

## 2013-04-09 DIAGNOSIS — R110 Nausea: Secondary | ICD-10-CM | POA: Insufficient documentation

## 2013-04-09 DIAGNOSIS — M25579 Pain in unspecified ankle and joints of unspecified foot: Secondary | ICD-10-CM | POA: Insufficient documentation

## 2013-04-09 DIAGNOSIS — R739 Hyperglycemia, unspecified: Secondary | ICD-10-CM

## 2013-04-09 DIAGNOSIS — E119 Type 2 diabetes mellitus without complications: Secondary | ICD-10-CM

## 2013-04-09 LAB — COMPREHENSIVE METABOLIC PANEL
ALT: 20 U/L (ref 0–35)
AST: 17 U/L (ref 0–37)
Albumin: 3.9 g/dL (ref 3.5–5.2)
Alkaline Phosphatase: 116 U/L (ref 39–117)
BILIRUBIN TOTAL: 0.3 mg/dL (ref 0.3–1.2)
BUN: 11 mg/dL (ref 6–23)
CHLORIDE: 95 meq/L — AB (ref 96–112)
CO2: 22 mEq/L (ref 19–32)
Calcium: 9.3 mg/dL (ref 8.4–10.5)
Creatinine, Ser: 0.57 mg/dL (ref 0.50–1.10)
GFR calc Af Amer: >90 mL/min (ref >90)
GFR calc non Af Amer: >90 mL/min (ref >90)
Glucose, Bld: 368 mg/dL — ABNORMAL HIGH (ref 70–99)
Potassium: 4.4 mEq/L (ref 3.7–5.3)
Sodium: 133 mEq/L — ABNORMAL LOW (ref 137–147)
Total Protein: 8 g/dL (ref 6.0–8.3)

## 2013-04-09 LAB — CBC WITH DIFFERENTIAL/PLATELET
Basophils Absolute: 0 10*3/uL (ref 0.0–0.1)
Basophils Relative: 0 % (ref 0–1)
EOS ABS: 0.1 10*3/uL (ref 0.0–0.7)
Eosinophils Relative: 3 % (ref 0–5)
HCT: 38.9 % (ref 36.0–46.0)
HEMOGLOBIN: 13.3 g/dL (ref 12.0–15.0)
Lymphocytes Relative: 40 % (ref 12–46)
Lymphs Abs: 1.7 10*3/uL (ref 0.7–4.0)
MCH: 27.4 pg (ref 26.0–34.0)
MCHC: 34.2 g/dL (ref 30.0–36.0)
MCV: 80 fL (ref 78.0–100.0)
MONOS PCT: 4 % (ref 3–12)
Monocytes Absolute: 0.2 10*3/uL (ref 0.1–1.0)
NEUTROS ABS: 2.2 10*3/uL (ref 1.7–7.7)
Neutrophils Relative %: 53 % (ref 43–77)
Platelets: 350 10*3/uL (ref 150–400)
RBC: 4.86 MIL/uL (ref 3.87–5.11)
RDW: 13.4 % (ref 11.5–15.5)
WBC: 4.2 10*3/uL (ref 4.0–10.5)

## 2013-04-09 LAB — CBG MONITORING, ED
Glucose-Capillary: 358 mg/dL — ABNORMAL HIGH (ref 70–99)
Glucose-Capillary: 325 mg/dL — ABNORMAL HIGH (ref 70–99)
Glucose-Capillary: 252 mg/dL — ABNORMAL HIGH (ref 70–99)

## 2013-04-09 LAB — URINALYSIS, ROUTINE W REFLEX MICROSCOPIC
Bilirubin Urine: NEGATIVE
Glucose, UA: >1000 mg/dL — AB
Hgb urine dipstick: NEGATIVE
KETONES UR: NEGATIVE mg/dL
LEUKOCYTES UA: NEGATIVE
NITRITE: NEGATIVE
PH: 7 (ref 5.0–8.0)
PROTEIN: NEGATIVE mg/dL
Specific Gravity, Urine: 1.031 — ABNORMAL HIGH (ref 1.005–1.030)
Urobilinogen, UA: 0.2 mg/dL (ref 0.0–1.0)

## 2013-04-09 LAB — HCG, QUANTITATIVE, PREGNANCY

## 2013-04-09 LAB — URINE MICROSCOPIC-ADD ON

## 2013-04-09 LAB — GLUCOSE, CAPILLARY: Glucose-Capillary: 384 mg/dL — ABNORMAL HIGH (ref 70–99)

## 2013-04-09 LAB — POCT PREGNANCY, URINE: Preg Test, Ur: NEGATIVE

## 2013-04-09 MED ORDER — SODIUM CHLORIDE 0.9 % IV BOLUS (SEPSIS)
1000.0000 mL | Freq: Once | INTRAVENOUS | Status: AC
  Administered 2013-04-09: 1000 mL via INTRAVENOUS

## 2013-04-09 NOTE — MAU Note (Signed)
Pt woke up @ 0200, felt like R leg was on fire, can't bend foot.  CBG @ that time was 440, took 25 units of insulin by pump.  One hour later CBG was 443, took 16 more units of insulin. At 0500, took 13 more units.  CBG @ 0615 was 431.  Basal rate on pump is 3 units/hour.

## 2013-04-09 NOTE — ED Notes (Addendum)
Transferred to ED via [facility]-- went to [facility]U with high glucose states "last time it was high, I was pregnant" --  Has insulin pump= regular doctor-- Dr. Claiborne BillingsKuneff-- [facility] -- has a 1810 month old

## 2013-04-09 NOTE — MAU Note (Signed)
Pt states that her blood sugars are usually 200-215 fasting. [place] 11 [place]

## 2013-04-09 NOTE — ED Notes (Signed)
Ambulated in hall to bathroom without difficulty-- limping on right ankle with c/o pain. Steady on feet

## 2013-04-09 NOTE — ED Provider Notes (Signed)
CSN: [id number]     Arrival date & time 04/09/13  0712 History   First MD Initiated Contact with Patient 04/09/13 [phone]     Chief Complaint  Patient presents with  . Hyperglycemia     (Consider location/radiation/quality/duration/timing/severity/associated sxs/prior Treatment) The history is provided by the patient. No language interpreter was used.  Karina [name] is a 28 y/o F with PMhx of DM presenting to the ED from [facility] with transfer secondary to hyperglycemia. Patient reported that she started to have bilateral leg pain - described as a burning sensation - that woke her up out of a sound sleep. Reported that she has checked her sugar and reported that her sugar was approximately 440 - reported that she took 25 Units of Novolog. Reported that she re-checked her sugar level an hour later and reported that the sugar level was 443 and took 13 Units of Novolog. Patient reported that she went back to sleep and re-checked her sugars at 6:00AM where they were 431 and she then took 16 Units of Novolog. Patient concerned and checked herself in at MAU to be assessed - reported that when she had these symptoms she was pregnant. Patient reported that she has an insulin pump with a basal rate of 3 Units/hour. [facility] and patient has yet to see the endocrinologist. Patient reported that she has been stress eating and not sticking with her diet - reported that back in February 2015 she was no carbs, no sweets, and no dairy. Reported that normally her sugars range form 130-140, but has now increased to 250-260. Reported that she has been feeling dizzy all morning with mild associated dizziness. Reported that she has been feeling nauseous as well with negative episodes of emesis. Reported increased urination increased thirst. Denied fever, chills, chest pain, shortness of breath, difficulty breathing, sudden loss of  vision, numbness, tingling, weakness, abdominal pain, dysuria, vomiting, diarrhea, melena, hematochezia. PCP Dr. Claiborne BillingsKuneff  Past Medical History  Diagnosis Date  . Diabetes mellitus     Type 2, insulin resistant   Past Surgical History  Procedure Laterality Date  . Cervical biopsy  2004  . Cesarean section N/A 06/02/2012    Procedure: CESAREAN SECTION;  Surgeon: Catalina AntiguaPeggy Constant, MD;  Location: [facility];  Service: Obstetrics;  Laterality: N/A;   Family History  Problem Relation Age of Onset  . Diabetes Father    History  Substance Use Topics  . Smoking status: Never Smoker   . Smokeless tobacco: Never Used  . Alcohol Use: No   OB History   Grav Para Term Preterm Abortions TAB SAB Ect Mult Living   1 1 1       1      Review of Systems  Constitutional: Negative for fever and chills.  Eyes: Positive for visual disturbance.  Respiratory: Negative for chest tightness and shortness of breath.   Cardiovascular: Negative for chest pain.  Gastrointestinal: Positive for nausea. Negative for vomiting, abdominal pain, diarrhea and constipation.  Genitourinary: Positive for frequency. Negative for decreased urine volume.  Musculoskeletal: Positive for arthralgias (right ankle and foot discomfort). Negative for back pain and neck pain.  Neurological: Positive for dizziness. Negative for weakness, numbness and headaches.  All other systems reviewed and are negative.      Allergies  Review of patient's allergies indicates no known allergies.  Home Medications   Current Outpatient Rx  Name  Route  Sig  Dispense  Refill  . guaiFENesin (ROBITUSSIN) 100 MG/5ML liquid   Oral   Take 200 mg by mouth once.         [name] ibuprofen (ADVIL,MOTRIN) 200 MG tablet   Oral   Take 600 mg by mouth daily as needed for moderate pain.         [name] insulin aspart (NOVOLOG) 100 UNIT/ML injection   Subcutaneous   Inject 3 Units into the skin 3 (three) times daily with meals.   3 vial   0     She is on  an insulin pump. Dosage varies. She may  ...   . Insulin Human (INSULIN PUMP) 100 unit/ml SOLN      Novolog Insulin.  Inject 2.15 units continuous from 12:00am until 2:00pm.  2:00pm until 12:00am, inject 3 units continous.   3 each   3   . Insulin Pen Needle 31G X 5 MM MISC   Does not apply   1 Container by Does not apply route 2 (two) times daily before a meal. Dispense quantity sufficient for twice daily injections   1 each   0    BP 133/85  Pulse 81  Temp(Src) 98 F (36.7 C) (Oral)  Resp 20  Ht 5\' 2"  (1.575 m)  Wt 236 lb 6.4 oz (107.23 kg)  BMI 43.23 kg/m2  SpO2 98%  LMP 03/17/2013 Physical Exam  Nursing note and vitals reviewed. Constitutional: She is oriented to person, place, and time. She appears well-developed and well-nourished. No distress.  HENT:  Head: Normocephalic and atraumatic.  Mouth/Throat: Oropharynx is clear and moist. No oropharyngeal exudate.  Eyes: Conjunctivae and EOM are normal. Pupils are equal, round, and reactive to light. Right eye exhibits no discharge. Left eye exhibits no discharge.  Neck: Normal range of motion. Neck supple. No tracheal deviation present.  Negative neck stiffness Negative nuchal rigidity   Cardiovascular: Normal rate, regular rhythm and normal heart sounds.  Exam reveals no friction rub.   No murmur heard. Pulmonary/Chest: Effort normal and breath sounds normal. No respiratory distress. She has no wheezes. She has no rales.  Musculoskeletal: Normal range of motion.  Full ROM to upper and lower extremities without difficulty noted, negative ataxia noted.  Negative swelling, erythema, inflammation, lesions, sores, cellulitic findings localized to the right lower extremity. Discomfort upon palpation to the ankle region of the right lower extremity circumferentially. Negative warmth upon palpation. Decreased range of motion to the digits of right foot secondary to pain. Decreased range of motion to the right ankle secondary to  pain.  Lymphadenopathy:    She has no cervical adenopathy.  Neurological: She is alert and oriented to person, place, and time. No cranial nerve deficit. She exhibits normal muscle tone. Coordination normal.  Cranial nerves III-XII grossly intact Strength 5+/5+ to upper and lower extremities bilaterally with resistance applied, equal distribution noted Sensation intact with differentiation sharp and dull touch Patient is able to bring finger to nose bilaterally without difficulty or ataxia Heel to knee down shin normal bilaterally Gait proper, proper balance - negative sway, negative drift, negative step-offs  Skin: Skin is warm and dry. No rash noted. She is not diaphoretic. No erythema.  Negative rashes or lesions noted to the skin  Psychiatric: She has a normal mood and affect. Her behavior is normal. Thought content normal.    ED Course  Procedures (including critical care time)  3:54 PM This provider re-assessed the patient. Patient appears to be doing well. Reported that she is  feeling better.   Results for orders placed during the hospital encounter of 04/09/13  GLUCOSE, CAPILLARY      Result Value Ref Range   Glucose-Capillary 384 (*) 70 - 99 mg/dL  CBC WITH DIFFERENTIAL      Result Value Ref Range   WBC 4.2  4.0 - 10.5 K/uL   RBC 4.86  3.87 - 5.11 MIL/uL   Hemoglobin 13.3  12.0 - 15.0 g/dL   HCT 16.1  09.6 - 04.5 %   MCV 80.0  78.0 - 100.0 fL   MCH 27.4  26.0 - 34.0 pg   MCHC 34.2  30.0 - 36.0 g/dL   RDW 40.9  81.1 - 91.4 %   Platelets 350  150 - 400 K/uL   Neutrophils Relative % 53  43 - 77 %   Neutro Abs 2.2  1.7 - 7.7 K/uL   Lymphocytes Relative 40  12 - 46 %   Lymphs Abs 1.7  0.7 - 4.0 K/uL   Monocytes Relative 4  3 - 12 %   Monocytes Absolute 0.2  0.1 - 1.0 K/uL   Eosinophils Relative 3  0 - 5 %   Eosinophils Absolute 0.1  0.0 - 0.7 K/uL   Basophils Relative 0  0 - 1 %   Basophils Absolute 0.0  0.0 - 0.1 K/uL  COMPREHENSIVE METABOLIC PANEL      Result Value  Ref Range   Sodium 133 (*) 137 - 147 mEq/L   Potassium 4.4  3.7 - 5.3 mEq/L   Chloride 95 (*) 96 - 112 mEq/L   CO2 22  19 - 32 mEq/L   Glucose, Bld 368 (*) 70 - 99 mg/dL   BUN 11  6 - 23 mg/dL   Creatinine, Ser 7.82  0.50 - 1.10 mg/dL   Calcium 9.3  8.4 - 95.6 mg/dL   Total Protein 8.0  6.0 - 8.3 g/dL   Albumin 3.9  3.5 - 5.2 g/dL   AST 17  0 - 37 U/L   ALT 20  0 - 35 U/L   Alkaline Phosphatase 116  39 - 117 U/L   Total Bilirubin 0.3  0.3 - 1.2 mg/dL   GFR calc non Af Amer >90  >90 mL/min   GFR calc Af Amer >90  >90 mL/min  HCG, QUANTITATIVE, PREGNANCY      Result Value Ref Range   hCG, Beta Chain, Quant, S <1  <5 mIU/mL  URINALYSIS, ROUTINE W REFLEX MICROSCOPIC      Result Value Ref Range   Color, Urine YELLOW  YELLOW   APPearance CLEAR  CLEAR   Specific Gravity, Urine 1.031 (*) 1.005 - 1.030   pH 7.0  5.0 - 8.0   Glucose, UA >1000 (*) NEGATIVE mg/dL   Hgb urine dipstick NEGATIVE  NEGATIVE   Bilirubin Urine NEGATIVE  NEGATIVE   Ketones, ur NEGATIVE  NEGATIVE mg/dL   Protein, ur NEGATIVE  NEGATIVE mg/dL   Urobilinogen, UA 0.2  0.0 - 1.0 mg/dL   Nitrite NEGATIVE  NEGATIVE   Leukocytes, UA NEGATIVE  NEGATIVE  URINE MICROSCOPIC-ADD ON      Result Value Ref Range   Squamous Epithelial / LPF RARE  RARE   RBC / HPF 0-2  <3 RBC/hpf  POCT PREGNANCY, URINE      Result Value Ref Range   Preg Test, Ur NEGATIVE  NEGATIVE  CBG MONITORING, ED      Result Value Ref Range   Glucose-Capillary 358 (*) 70 -  99 mg/dL  CBG MONITORING, ED      Result Value Ref Range   Glucose-Capillary 325 (*) 70 - 99 mg/dL  CBG MONITORING, ED      Result Value Ref Range   Glucose-Capillary 252 (*) 70 - 99 mg/dL    Labs Review Labs Reviewed  GLUCOSE, CAPILLARY - Abnormal; Notable for the following:    Glucose-Capillary 384 (*)    All other components within normal limits  COMPREHENSIVE METABOLIC PANEL - Abnormal; Notable for the following:    Sodium 133 (*)    Chloride 95 (*)    Glucose, Bld 368  (*)    All other components within normal limits  URINALYSIS, ROUTINE W REFLEX MICROSCOPIC - Abnormal; Notable for the following:    Specific Gravity, Urine 1.031 (*)    Glucose, UA >1000 (*)    All other components within normal limits  CBG MONITORING, ED - Abnormal; Notable for the following:    Glucose-Capillary 358 (*)    All other components within normal limits  CBG MONITORING, ED - Abnormal; Notable for the following:    Glucose-Capillary 325 (*)    All other components within normal limits  CBG MONITORING, ED - Abnormal; Notable for the following:    Glucose-Capillary 252 (*)    All other components within normal limits  CBC WITH DIFFERENTIAL  HCG, QUANTITATIVE, PREGNANCY  URINE MICROSCOPIC-ADD ON  POCT PREGNANCY, URINE   Imaging Review No results found.   EKG Interpretation None      MDM   Final diagnoses:  Hyperglycemia without ketosis   Medications  sodium chloride 0.9 % bolus 1,000 mL (0 mLs Intravenous Stopped 04/09/13 1100)  sodium chloride 0.9 % bolus 1,000 mL (0 mLs Intravenous Stopped 04/09/13 1614)   Filed Vitals:   04/09/13 0722 04/09/13 0937 04/09/13 1600 04/09/13 1618  BP: 131/76 130/78 133/85   Pulse: 86 85 81   Temp: 98.1 F (36.7 C) 98.1 F (36.7 C)  98 F (36.7 C)  TempSrc: Oral Oral  Oral  Resp: 18 20    Height: 5\' 2"  (1.575 m)     Weight: 236 lb 6.4 oz (107.23 kg)     SpO2: 97% 100% 98%     Patient presenting to the ED with hyperglycemia. Patient reported that she was experiencing bilateral burning of her legs when she checked her sugar at approximately 2:00 AM this morning, reported that the sugar level was high. Patient was concerned and went to an 49-year-old, reported that when she had the same exact symptom she was diagnosed as being pregnant. Urine pregnancy negative it in a year with elevated glucose levels and was sent to [facility] ED for monitoring and control in glucose levels. Alert and oriented. GCS 15. Heart rate and rhythm  normal. Lungs clear to auscultation to upper and lower lobes bilaterally. Radial and DP pulses 2+ bilaterally. Cap refill less than 3 seconds. Negative oral lesions noted. Negative rashes noted to the skin. Obese. Full range of motion to upper and lower tremors bilaterally without difficulty noted. Strength intact with equal distribution to upper and lower extremities bilaterally with resistance applied. Equal grip strength. Patient is able to bring finger to nose bilaterally without difficulty or ataxia. Heel to knee down shin bilaterally without difficulty. Dentition intact with differentiation to sharp and dull touch. Discomfort upon palpation to the right lower extremity circumferentially localized to the distal tib-fib/ankle region. CBC negative elevated white cell count identified--negative left shift or leukocytosis noted CMP noted mildly  low sodium of 133. Glucose 368. Anion gap of 16.0 mEq per liter. Beta hCG less than 1. Urine pregnancy negative. Repeat CBG 325 and then 252 after 2 L of fluids. UA negative for nitrites-elevated specific gravity 1.031, negative ketones in urine noted.  Doubt DKA. Suspicion to be episode of hyperglycemia. Anion gap of 16.0 mEq/L - unspecific elevated anion gap. Fluids administered in ED setting where sugars decreased from 384 to 252. Patient stable, afebrile. Discussed case and labs with attending physician in great detail who agreed to plan of discharge for patient. Discharged patient. Discussed with patient proper diet. Referred patient to primary care provider in [facility]. Discussed with patient to continue to monitor her sugar levels closely. Recommended patient to go back to nondairy, no car, decreased weeks. Discussed with patient to stay hydrated. Discussed with patient to closely monitor symptoms and if symptoms are to worsen or change to report back to the ED - strict return instructions given.  Patient agreed to plan of care, understood, all  questions answered.   Raymon Mutton, PA-C 04/09/13 1759  Raymon Mutton, PA-C 04/09/13 1801

## 2013-04-09 NOTE — MAU Provider Note (Signed)
Chief Complaint: Hyperglycemia  First Provider Initiated Contact with Patient 04/09/13 [phone]     SUBJECTIVE HPI: Karina [name] is a 28 y.o. G1P1001 who presents with intense burning in right leg since 0200. Type 2 insulin dependent diabetic w/ pump. Has Hx of mid neuropathy, but nothing this bad. Difficulty ambulating due to pain. Concerned about possible pregnancy because last pregnancy she had difficulty controlling blood sugars in nearly pregnancy. Denies dietary changes, illness, recent changes in pump setting. Pt and CNM verified that there appears to be insulin in pump.   [facility], but miscommunication occurred. Has not started care with them.   CBGs 0200: 440, 25 units of regular insulin by pump.  0300: 443, 16 units  0500: CBG___, 13 units 0615: 431, came to MAU. Basal rate on pump is 3 units/hour   Past Medical History  Diagnosis Date  . Diabetes mellitus     Type 2, insulin resistant   OB History  Gravida Para Term Preterm AB SAB TAB Ectopic Multiple Living  1 1 1       1     # Outcome Date GA Lbr Len/2nd Weight Sex Delivery Anes PTL Lv  1 TRM 06/02/12 [id number] 25:36 / 12:32  F LTCS EPI  Y     Comments: caput     Past Surgical History  Procedure Laterality Date  . Cervical biopsy  2004  . Cesarean section N/A 06/02/2012    Procedure: CESAREAN SECTION;  Surgeon: Catalina AntiguaPeggy Constant, MD;  Location: [facility];  Service: Obstetrics;  Laterality: N/A;   History   Social History  . Marital Status: Married    Spouse Name: N/A    Number of Children: N/A  . Years of Education: N/A   Occupational History  . Not on file.   Social History Main Topics  . Smoking status: Never Smoker   . Smokeless tobacco: Never Used  . Alcohol Use: No  . Drug Use: No  . Sexual Activity: Yes    Birth Control/ Protection: None   Other Topics Concern  . Not on file   Social History Narrative  . No narrative on  file   No current facility-administered medications on file prior to encounter.   Current Outpatient Prescriptions on File Prior to Encounter  Medication Sig Dispense Refill  . exenatide (BYETTA 5 MCG PEN) 5 MCG/0.02ML SOPN injection Inject 0.02 mLs (5 mcg total) into the skin 2 (two) times daily with a meal.  1.2 mL  0  . fluconazole (DIFLUCAN) 150 MG tablet Take 1 tablet (150 mg total) by mouth once.  1 tablet  0  . insulin aspart (NOVOLOG) 100 UNIT/ML injection Inject 3 Units into the skin 3 (three) times daily with meals.  3 vial  0  . Insulin Human (INSULIN PUMP) 100 unit/ml SOLN Novolog Insulin.  Inject 2.15 units continuous from 12:00am until 2:00pm.  2:00pm until 12:00am, inject 3 units continous.  3 each  3  . Insulin Pen Needle 31G X 5 MM MISC 1 Container by Does not apply route 2 (two) times daily before a meal. Dispense quantity sufficient for twice daily injections  1 each  0   No Known Allergies  ROS: Pertinent items in HPI. Neg for altered mental status.   OBJECTIVE Blood pressure 131/76, pulse 86, temperature 98.1 F (36.7 C), temperature source Oral, resp. rate 18, height 5\' 2"  (1.575 m), weight 107.23 kg (236 lb  6.4 oz), last menstrual period 03/17/2013, SpO2 97.00%. GENERAL: Well-developed, well-nourished female in mild distress at rest. Ambulates very slowly. Normal mental status.   HEENT: Normocephalic HEART: normal rate RESP: normal effort EXTREMITIES: Nontender, no edema NEURO: Alert and oriented  LAB RESULTS Results for orders placed during the hospital encounter of 04/09/13 (from the past 24 hour(s))  GLUCOSE, CAPILLARY     Status: Abnormal   Collection Time    04/09/13  7:37 AM      Result Value Ref Range   Glucose-Capillary 384 (*) 70 - 99 mg/dL  POCT PREGNANCY, URINE     Status: None   Collection Time    04/09/13  7:39 AM      Result Value Ref Range   Preg Test, Ur NEGATIVE  NEGATIVE    IMAGING No results found.  MAU COURSE Discussed  hyperglycemai w/ neg UPT and no missed menses a/ Dr. Debroah Loop. Will transfer to [facility] ED to further eval and Tx.   Discussed pt w/ Pricilla Loveless MD at [facility] ED. No labs or insulin ordered prior to transfer. Accepts care of pt.   ASSESSMENT 1. 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 Dr. Criss Alvine accepting MD.  Dorathy Kinsman, CNM 04/09/2013  8:35 AM

## 2013-04-09 NOTE — Discharge Instructions (Signed)
Please call your doctor for a followup appointment within 24-48 hours. When you talk to your doctor please let them know that you were seen in the emergency department and have them acquire all of your records so that they can discuss the findings with you and formulate a treatment plan to fully care for your new and ongoing problems. Please call and set-up an appointment with [facility]  Please continue to monitor glucose levels Please stick with non-dairy, decreased sweets, and non-carbs Please continue to monitor symptoms closely and if symptoms are to worsen or change (fever greater than 101, chills, sweating, chest pain, shortness of breath, difficulty breathing, numbness, tingling, elevated glucose levels, dizziness, fainting, disorientation, abdominal pain, nausea, vomiting, blood in the stools, black tarry stools) please report back to emergency apartment immediately  Hyperglycemia Hyperglycemia occurs when the glucose (sugar) in your blood is too high. Hyperglycemia can happen for many reasons, but it most often happens to people who do not know they have diabetes or are not managing their diabetes properly.  CAUSES  Whether you have diabetes or not, there are other causes of hyperglycemia. Hyperglycemia can occur when you have diabetes, but it can also occur in other situations that you might not be as aware of, such as: Diabetes  If you have diabetes and are having problems controlling your blood glucose, hyperglycemia could occur because of some of the following reasons:  Not following your meal plan.  Not taking your diabetes medications or not taking it properly.  Exercising less or doing less activity than you normally do.  Being sick. Pre-diabetes  This cannot be ignored. Before people develop Type 2 diabetes, they almost always have "pre-diabetes." This is when your blood glucose levels are higher than normal, but not yet high enough to be diagnosed as  diabetes. Research has shown that some long-term damage to the body, especially the heart and circulatory system, may already be occurring during pre-diabetes. If you take action to manage your blood glucose when you have pre-diabetes, you may delay or prevent Type 2 diabetes from developing. Stress  If you have diabetes, you may be "diet" controlled or on oral medications or insulin to control your diabetes. However, you may find that your blood glucose is higher than usual in the hospital whether you have diabetes or not. This is often referred to as "stress hyperglycemia." Stress can elevate your blood glucose. This happens because of hormones put out by the body during times of stress. If stress has been the cause of your high blood glucose, it can be followed regularly by your caregiver. That way he/she can make sure your hyperglycemia does not continue to get worse or progress to diabetes. Steroids  Steroids are medications that act on the infection fighting system (immune system) to block inflammation or infection. One side effect can be a rise in blood glucose. Most people can produce enough extra insulin to allow for this rise, but for those who cannot, steroids make blood glucose levels go even higher. It is not unusual for steroid treatments to "uncover" diabetes that is developing. It is not always possible to determine if the hyperglycemia will go away after the steroids are stopped. A special blood test called an A1c is sometimes done to determine if your blood glucose was elevated before the steroids were started. SYMPTOMS  Thirsty.  Frequent urination.  Dry mouth.  Blurred vision.  Tired or fatigue.  Weakness.  Sleepy.  Tingling in feet or leg. DIAGNOSIS  Diagnosis is made by monitoring blood glucose in one or all of the following ways:  A1c test. This is a chemical found in your blood.  Fingerstick blood glucose monitoring.  Laboratory results. TREATMENT  First,  knowing the cause of the hyperglycemia is important before the hyperglycemia can be treated. Treatment may include, but is not be limited to:  Education.  Change or adjustment in medications.  Change or adjustment in meal plan.  Treatment for an illness, infection, etc.  More frequent blood glucose monitoring.  Change in exercise plan.  Decreasing or stopping steroids.  Lifestyle changes. HOME CARE INSTRUCTIONS   Test your blood glucose as directed.  Exercise regularly. Your caregiver will give you instructions about exercise. Pre-diabetes or diabetes which comes on with stress is helped by exercising.  Eat wholesome, balanced meals. Eat often and at regular, fixed times. Your caregiver or nutritionist will give you a meal plan to guide your sugar intake.  Being at an ideal weight is important. If needed, losing as little as 10 to 15 pounds may help improve blood glucose levels. SEEK MEDICAL CARE IF:   You have questions about medicine, activity, or diet.  You continue to have symptoms (problems such as increased thirst, urination, or weight gain). SEEK IMMEDIATE MEDICAL CARE IF:   You are vomiting or have diarrhea.  Your breath smells fruity.  You are breathing faster or slower.  You are very sleepy or incoherent.  You have numbness, tingling, or pain in your feet or hands.  You have chest pain.  Your symptoms get worse even though you have been following your caregiver's orders.  If you have any other questions or concerns. Document Released: 06/23/2000 Document Revised: 03/22/2011 Document Reviewed: 04/26/2011 [facility] Patient Information 2014 [employer] [place].

## 2013-04-09 NOTE — MAU Note (Signed)
Pt states the last time she could not regulate her blood sugars, she was pregnant.  LMP 3/7.  UPT ordered.

## 2013-04-11 NOTE — ED Provider Notes (Signed)
Medical screening examination/treatment/procedure(s) were performed by non-physician practitioner and as supervising physician I was immediately available for consultation/collaboration.   EKG Interpretation None        Audree CamelScott T Kali Deadwyler, MD 04/11/13 1348

## 2013-04-19 ENCOUNTER — Other Ambulatory Visit: Payer: Self-pay

## 2013-05-18 ENCOUNTER — Telehealth: Payer: Self-pay | Admitting: Family Medicine

## 2013-05-18 DIAGNOSIS — E119 Type 2 diabetes mellitus without complications: Secondary | ICD-10-CM

## 2013-05-18 MED ORDER — INSULIN PUMP: SUBCUTANEOUS | Status: DC

## 2013-05-18 MED ORDER — INSULIN ASPART 100 UNIT/ML ~~LOC~~ SOLN: 3.0000 [IU] | Freq: Three times a day (TID) | SUBCUTANEOUS | Status: DC

## 2013-05-18 NOTE — Telephone Encounter (Signed)
Patient needs refill on insulin. Patient would also like to speak to Dr. Claiborne BillingsKuneff about discontinuing the use of her insulin pump. Please call patient [phone]

## 2013-05-18 NOTE — Telephone Encounter (Signed)
Please call Karina [name] and make her aware. Karina [name], to my knowledge, is being seen by another provider for her insulin pump management. We had set this appointment up for her months ago because she wanted to stay on the insulin pump. She should be getting all of her diabetic supplies and insulin through this person. If she would like to ne removed off the pump, she will need to make an appointment to discuss this with whoever is managing her pump currently. I am unaware of her current sugars and management so this is difficult for me to guide her in this  decision. If the decision is made to remove her from the pump, I would be happy to  manage her after the transition. I have called in the insulin we have on our list as her current. In the future she needs to get her insulin through the Doctor managing her diabetes.  Thanks.

## 2013-05-21 NOTE — Telephone Encounter (Signed)
Patient states she has never seen another physician.they never schedule an appointment to manage diabetes or insulin pump.she has schedule an appointment with you on the 13th to discuss getting off the insulin pump.Amedeo GoryGiovanna S Karleigh [name]

## 2013-05-23 ENCOUNTER — Ambulatory Visit (INDEPENDENT_AMBULATORY_CARE_PROVIDER_SITE_OTHER): Payer: Medicaid Other | Admitting: Family Medicine

## 2013-05-23 ENCOUNTER — Encounter: Payer: Self-pay | Admitting: Family Medicine

## 2013-05-23 VITALS — BP 112/76 | HR 94 | Temp 99.4°F | Ht 62.0 in | Wt 242.0 lb

## 2013-05-23 DIAGNOSIS — E1140 Type 2 diabetes mellitus with diabetic neuropathy, unspecified: Secondary | ICD-10-CM

## 2013-05-23 DIAGNOSIS — R319 Hematuria, unspecified: Secondary | ICD-10-CM

## 2013-05-23 DIAGNOSIS — IMO0001 Reserved for inherently not codable concepts without codable children: Secondary | ICD-10-CM

## 2013-05-23 DIAGNOSIS — E118 Type 2 diabetes mellitus with unspecified complications: Secondary | ICD-10-CM

## 2013-05-23 DIAGNOSIS — E11339 Type 2 diabetes mellitus with moderate nonproliferative diabetic retinopathy without macular edema: Secondary | ICD-10-CM

## 2013-05-23 DIAGNOSIS — E119 Type 2 diabetes mellitus without complications: Secondary | ICD-10-CM

## 2013-05-23 DIAGNOSIS — E1165 Type 2 diabetes mellitus with hyperglycemia: Secondary | ICD-10-CM

## 2013-05-23 DIAGNOSIS — IMO0002 Reserved for concepts with insufficient information to code with codable children: Secondary | ICD-10-CM

## 2013-05-23 DIAGNOSIS — B373 Candidiasis of vulva and vagina: Secondary | ICD-10-CM

## 2013-05-23 DIAGNOSIS — E1149 Type 2 diabetes mellitus with other diabetic neurological complication: Secondary | ICD-10-CM

## 2013-05-23 DIAGNOSIS — B3731 Acute candidiasis of vulva and vagina: Secondary | ICD-10-CM

## 2013-05-23 HISTORY — DX: Type 2 diabetes mellitus with diabetic neuropathy, unspecified: E11.40

## 2013-05-23 LAB — POCT UA - MICROSCOPIC ONLY

## 2013-05-23 LAB — POCT URINALYSIS DIPSTICK
Bilirubin, UA: NEGATIVE
Blood, UA: NEGATIVE
GLUCOSE UA: 500
KETONES UA: NEGATIVE
Leukocytes, UA: NEGATIVE
Nitrite, UA: NEGATIVE
Protein, UA: 30
SPEC GRAV UA: 1.02
Urobilinogen, UA: 0.2
pH, UA: 7

## 2013-05-23 LAB — POCT GLYCOSYLATED HEMOGLOBIN (HGB A1C): Hemoglobin A1C: >14

## 2013-05-23 MED ORDER — FLUCONAZOLE 150 MG PO TABS: 150.0000 mg | ORAL_TABLET | Freq: Once | ORAL | Status: DC

## 2013-05-23 MED ORDER — INSULIN ASPART 100 UNIT/ML FLEXPEN: PEN_INJECTOR | SUBCUTANEOUS | Status: DC

## 2013-05-23 MED ORDER — GABAPENTIN 300 MG PO CAPS: 300.0000 mg | ORAL_CAPSULE | Freq: Two times a day (BID) | ORAL | Status: DC

## 2013-05-23 MED ORDER — INSULIN GLARGINE 100 UNIT/ML SOLOSTAR PEN: PEN_INJECTOR | SUBCUTANEOUS | Status: DC

## 2013-05-23 MED ORDER — INSULIN PEN NEEDLE 31G X 8 MM MISC: Status: DC

## 2013-05-23 NOTE — Patient Instructions (Signed)
1.) Take sugars 5x a day as discussed.  2.) Lantus 45 units in the morning.  3.) Insulin injection 10 units prior to breakfast and lunch and 15 units prior to dinner.  4.) Referral to Nutrition and podiatry today 5.) I will call you to let you know the results of your eye exam. 6.) Weight goal is #216 lbs: increase exercise to 150 minutes a week. 5 small diabetic meals a day.  7.) Start gabapentin for neuropathy pain.  8.) follow up with me next week and Dr. Raymondo BandKoval the following week.   Diabetes Meal Planning Guide The diabetes meal planning guide is a tool to help you plan your meals and snacks. It is important for people with diabetes to manage their blood glucose (sugar) levels. Choosing the right foods and the right amounts throughout your day will help control your blood glucose. Eating right can even help you improve your blood pressure and reach or maintain a healthy weight. CARBOHYDRATE COUNTING MADE EASY When you eat carbohydrates, they turn to sugar. This raises your blood glucose level. Counting carbohydrates can help you control this level so you feel better. When you plan your meals by counting carbohydrates, you can have more flexibility in what you eat and balance your medicine with your food intake. Carbohydrate counting simply means adding up the total amount of carbohydrate grams in your meals and snacks. Try to eat about the same amount at each meal. Foods with carbohydrates are listed below. Each portion below is 1 carbohydrate serving or 15 grams of carbohydrates. Ask your dietician how many grams of carbohydrates you should eat at each meal or snack. Grains and Starches  1 slice bread.   English muffin or hotdog/hamburger bun.   cup cold cereal (unsweetened).   cup cooked pasta or rice.   cup starchy vegetables (corn, potatoes, peas, beans, winter squash).  1 tortilla (6 inches).   bagel.  1 waffle or pancake (size of a CD).   cup cooked cereal.  4 to 6  small crackers. *Whole grain is recommended. Fruit  1 cup fresh unsweetened berries, melon, papaya, pineapple.  1 small fresh fruit.   banana or mango.   cup fruit juice (4 oz unsweetened).   cup canned fruit in natural juice or water.  2 tbs dried fruit.  12 to 15 grapes or cherries. Milk and Yogurt  1 cup fat-free or 1% milk.  1 cup soy milk.  6 oz light yogurt with sugar-free sweetener.  6 oz low-fat soy yogurt.  6 oz plain yogurt. Vegetables  1 cup raw or  cup cooked is counted as 0 carbohydrates or a "free" food.  If you eat 3 or more servings at 1 meal, count them as 1 carbohydrate serving. Other Carbohydrates   oz chips or pretzels.   cup ice cream or frozen yogurt.   cup sherbet or sorbet.  2 inch square cake, no frosting.  1 tbs honey, sugar, jam, jelly, or syrup.  2 small cookies.  3 squares of graham crackers.  3 cups popcorn.  6 crackers.  1 cup broth-based soup.  Count 1 cup casserole or other mixed foods as 2 carbohydrate servings.  Foods with less than 20 calories in a serving may be counted as 0 carbohydrates or a "free" food. You may want to purchase a book or computer software that lists the carbohydrate gram counts of different foods. In addition, the nutrition facts panel on the labels of the foods you eat are a  good source of this information. The label will tell you how big the serving size is and the total number of carbohydrate grams you will be eating per serving. Divide this number by 15 to obtain the number of carbohydrate servings in a portion. Remember, 1 carbohydrate serving equals 15 grams of carbohydrate. SERVING SIZES Measuring foods and serving sizes helps you make sure you are getting the right amount of food. The list below tells how big or small some common serving sizes are.  1 oz.........4 stacked dice.  3 oz........[name].Deck of cards.  1 tsp.......[name].Tip of little finger.  1 tbs......[name].[name].Thumb.  2  tbs.......[name].Golf ball.   cup......[name].Half of a fist.  1 cup.......[name].A fist. SAMPLE DIABETES MEAL PLAN Below is a sample meal plan that includes foods from the grain and starches, dairy, vegetable, fruit, and meat groups. A dietician can individualize a meal plan to fit your calorie needs and tell you the number of servings needed from each food group. However, controlling the total amount of carbohydrates in your meal or snack is more important than making sure you include all of the food groups at every meal. You may interchange carbohydrate containing foods (dairy, starches, and fruits). The meal plan below is an example of a 2000 calorie diet using carbohydrate counting. This meal plan has 17 carbohydrate servings. Breakfast  1 cup oatmeal (2 carb servings).   cup light yogurt (1 carb serving).  1 cup blueberries (1 carb serving).   cup almonds. Snack  1 large apple (2 carb servings).  1 low-fat string cheese stick. Lunch  Chicken breast salad.  1 cup spinach.   cup chopped tomatoes.  2 oz chicken breast, sliced.  2 tbs low-fat [place] dressing.  12 whole-wheat crackers (2 carb servings).  12 to 15 grapes (1 carb serving).  1 cup low-fat milk (1 carb serving). Snack  1 cup carrots.   cup hummus (1 carb serving). Dinner  3 oz broiled salmon.  1 cup brown rice (3 carb servings). Snack  1  cups steamed broccoli (1 carb serving) drizzled with 1 tsp olive oil and lemon juice.  1 cup light pudding (2 carb servings). DIABETES MEAL PLANNING WORKSHEET Your dietician can use this worksheet to help you decide how many servings of foods and what types of foods are right for you.  BREAKFAST Food Group and Servings / Carb Servings Grain/Starches __________________________________ Dairy __________________________________________ Vegetable ______________________________________ Fruit ___________________________________________ Meat  __________________________________________ Fat ____________________________________________ LUNCH Food Group and Servings / Carb Servings Grain/Starches ___________________________________ Dairy ___________________________________________ Fruit ____________________________________________ Meat ___________________________________________ Fat _____________________________________________ Laural GoldenINNER Food Group and Servings / Carb Servings Grain/Starches ___________________________________ Dairy ___________________________________________ Fruit ____________________________________________ Meat ___________________________________________ Fat _____________________________________________ SNACKS Food Group and Servings / Carb Servings Grain/Starches ___________________________________ Dairy ___________________________________________ Vegetable _______________________________________ Fruit ____________________________________________ Meat ___________________________________________ Fat _____________________________________________ DAILY TOTALS Starches _________________________ Vegetable ________________________ Fruit ____________________________ Dairy ____________________________ Meat ____________________________ Fat ______________________________ Document Released: 09/24/2004 Document Revised: 03/22/2011 Document Reviewed: 08/05/2008 [employer] Patient Information 2014 [employer].

## 2013-05-24 ENCOUNTER — Encounter: Payer: Self-pay | Admitting: Home Health Services

## 2013-05-24 DIAGNOSIS — E113399 Type 2 diabetes mellitus with moderate nonproliferative diabetic retinopathy without macular edema, unspecified eye: Secondary | ICD-10-CM | POA: Insufficient documentation

## 2013-05-24 HISTORY — DX: Type 2 diabetes mellitus with moderate nonproliferative diabetic retinopathy without macular edema, unspecified eye: E11.3399

## 2013-05-25 ENCOUNTER — Encounter: Payer: Self-pay | Admitting: Family Medicine

## 2013-05-25 HISTORY — DX: Morbid (severe) obesity due to excess calories: E66.01

## 2013-05-25 NOTE — Assessment & Plan Note (Signed)
Repeat exam as needed every 6 months. Attempting tighter control with oral medications and injections. Patient counseled extensively on the risks of uncontrolled diabetes

## 2013-05-25 NOTE — Assessment & Plan Note (Signed)
A1c today greater than 14. Removing patient from insulin pump management and starting Lantus 45 units daily in the morning and NovoLog injections prior to meals. Prior to breakfast 10 units, prior to lunch 10 units, prior to dinner 15 units.  Referral to Dr. Gerilyn PilgrimSykes, nutrition, for diabetic diet management and exercise management. Start her gabapentin 300 mg twice a day for new-onset neuropathy today  Diabetic eye exam today resulted with moderate nonproliferative diabetic retinopathy we'll need to follow every 6 months. Normal bilateral foot exam today UA obtained today Will be following patient closely over the next few months to tighten management of her diabetes. Patient to see Dr. Raymondo BandKoval in one week and then myself the first week in June.

## 2013-05-25 NOTE — Progress Notes (Signed)
   Subjective:    Patient ID: Karina [name], female    DOB: 1985-02-08, 28 y.o.   MRN: [id number]  HPI  Karina [name] is a 28 y.o. female returns to clinic today for diabetes follow up. Patient has a history of diabetes, diabetic neuropathy and depression.  Diabetes: Patient returns today for diabetes management. She was last seen in early January as a new patient with a history of difficult to control diabetes and on a insulin pump. Patient had been seen prior by an endocrine office and placed on insulin pump during pregnancy. Initially patient desired to keep an insulin pump for her diabetes management. Resources were sought out to find a physician that would take Medicaid Medicare and manage her insulin pump as our office does not manage insulin pumps at this time. Patient was unable to contact the office and set up appointment and now is desiring to manage her diabetes with medications and injections, in place of the insulin pump. Patient has been intolerant to metformin due to GI upset. Additionally patient was intolerant to Byetta for the same reasons. Patient reports she uses 78 units of insulin daily that includes her boluses and basal rates of her pump. She states that she noticed that her fasting blood sugars are between 230-315. She reports that her highest pre-meal blood glucose is prior to dinner. Patient admits that she is not exercising or eating a diabetic diet or even a healthy diet. She is asking for more information on a healthy lifestyle.  She states she does monitor her feet and denies any open wounds, ingrown toenails or nonhealing wounds. Patient reports polyuria and polydipsia. Patient reports she has not had her diabetic eye exam completed in a few years. Patient states she does not feel symptomatic with her hyper glycemia, and she reports no hypoglycemia events. Patient reports burning sensation on the dorsal portion of her bilateral feet. She states this is new for her and she  has not experienced before. Nothing seems to make it worse or better. She has tried over-the-counter medications without relief.  Obesity: Patient inquiring about education on diabetic diet, healthy diet and exercise. She desires to lose weight and like assistance in doing so today. She has questions about using hydroxy cut and other over-the-counter formulations for weight loss. Patient's dates weight today is 242 , her lowest weight in the last 2 years documented as 206. Her BMI today is 44.25. Patient does not currently follow a diet or exercise.  Review of Systems Negative, with the exception of above mentioned in HPI  Objective:   Physical Exam BP 112/76  Pulse 94  Temp(Src) 99.4 F (37.4 C) (Oral)  Ht 5\' 2"  (1.575 m)  Wt 242 lb (109.77 kg)  BMI 44.25 kg/m2  LMP 05/05/2013 Gen: Pleasant African American female. Morbidly obese. In no acute distress, nontoxic in appearance. HEENT: AT. [place]. Bilateral TM visualized and normal in appearance. Bilateral eyes without injections or icterus. MMM.  CV: RRR, no murmur clicks gallops or rubs appreciated. Chest: CTAB, no wheeze or crackles Abd: Soft. NTND. BS present. No Masses palpated.  Ext: No erythema. No edema.  Skin: No rashes, purpura or petechiae.  Bilateral foot exam was completed today and documented quality metrics. Exam was normal.   >45 minutes was given on Counseling and coordinating  diabetic care for patient involving education, diabetic retinopathy exam, Nutrition and Pharmacy.

## 2013-05-25 NOTE — Assessment & Plan Note (Signed)
Patient with BMI of 44 today. Discussed in great detail healthy diet and exercise and the importance of this with her diabetes management. She seems motivated to attempt any weight loss and diet program Eyes patient to find any activity she enjoys that we'll be a form of exercise that she can do 150 minutes a week. Patient given diabetic diet and food labeling AVS Nutrition consult placed today Patient's set a initial goal weight of 216 pounds.

## 2013-05-25 NOTE — Assessment & Plan Note (Signed)
Patient with new-onset diabetic neuropathy in her bilateral feet. A1c today greater than 14. Removing patient from insulin pump management and starting Lantus daily and NovoLog injections prior to meals. Will be following patient closely over the next few months to tighten management of her diabetes. Start her gabapentin 300 mg twice a day for neuropathy.

## 2013-05-30 ENCOUNTER — Encounter: Payer: Self-pay | Admitting: Family Medicine

## 2013-05-30 ENCOUNTER — Ambulatory Visit (INDEPENDENT_AMBULATORY_CARE_PROVIDER_SITE_OTHER): Payer: Medicaid Other | Admitting: Family Medicine

## 2013-05-30 VITALS — BP 115/67 | HR 96 | Temp 98.6°F | Ht 62.0 in | Wt 241.2 lb

## 2013-05-30 DIAGNOSIS — E1140 Type 2 diabetes mellitus with diabetic neuropathy, unspecified: Secondary | ICD-10-CM

## 2013-05-30 DIAGNOSIS — E118 Type 2 diabetes mellitus with unspecified complications: Secondary | ICD-10-CM

## 2013-05-30 DIAGNOSIS — E119 Type 2 diabetes mellitus without complications: Secondary | ICD-10-CM

## 2013-05-30 DIAGNOSIS — IMO0001 Reserved for inherently not codable concepts without codable children: Secondary | ICD-10-CM

## 2013-05-30 DIAGNOSIS — IMO0002 Reserved for concepts with insufficient information to code with codable children: Secondary | ICD-10-CM

## 2013-05-30 DIAGNOSIS — E1165 Type 2 diabetes mellitus with hyperglycemia: Secondary | ICD-10-CM

## 2013-05-30 DIAGNOSIS — E1149 Type 2 diabetes mellitus with other diabetic neurological complication: Secondary | ICD-10-CM

## 2013-05-30 LAB — LIPID PANEL
Cholesterol: 176 mg/dL (ref 0–200)
HDL: 55 mg/dL (ref >39)
LDL Cholesterol: 107 mg/dL — ABNORMAL HIGH (ref 0–99)
Total CHOL/HDL Ratio: 3.2 Ratio
Triglycerides: 72 mg/dL (ref <150)
VLDL: 14 mg/dL (ref 0–40)

## 2013-05-30 NOTE — Progress Notes (Signed)
   Subjective:    Patient ID: Karina [name], female    DOB: Aug 23, 1985, 28 y.o.   MRN: [id number]  HPI Karina [name] is a ,28 y.o.  AAF with a history of diabetes uncontrolled with neuropathy and Moderate nonproliferation retinopathy [facility] follow up to DM Management  DM management: Patient recently got the insulin pump. Please see prior notes for specifics. Patient states that she was unable to start her Lantus until this past Saturday morning due to finances. She states she's taking 45 units as prescribed in the morning. She is to put her read back her mealtime insulin regimen correctly. Which is 10 units before breakfast and lunch and then 15 units before dinner. Patient states her lowest fasting glucose is 209. Her highest fasting glucose is 284. Her mealtime glucoses are between 189-230. Patient states she has been starting to exercise 20-30 minutes the treadmill every other day. She feels even with the short period of time she has been on plan this she is starting to feel better and more energetic.   Review of Systems Negative, with the exception of above mentioned in HPI'     Objective:   Physical Exam BP 115/67  Pulse 96  Temp(Src) 98.6 F (37 C) (Oral)  Ht 5\' 2"  (1.575 m)  Wt 241 lb 3.2 oz (109.408 kg)  BMI 44.11 kg/m2  LMP 05/05/2013 Gen: Pleasant African American female, morbidly obese. No acute distress nontoxic in appearance HEENT: AT. [place]. Bilateral eyes without injections or icterus. MMM.  CV: RRR  Ext: No erythema. No edema.

## 2013-05-30 NOTE — Assessment & Plan Note (Addendum)
Microalbumin collected today. Lipids also collected today. Will calculate CV risk and start statin medication if appropriate. Patient doing well on her new management still without a blood sugar lower than 200 except for one occasion of 189. Patient able to read back her diabetes regimen perfectly. Patient was given Dr. Gerilyn PilgrimSykes card today for her to call and set up nutrition appointment Patient to increase Lantus dose by 4 daily until she sees blood glucose levels fasting or evening levels around 100, until she sees Dr. Raymondo BandKoval next week. Bonita QuinLinda patient this is going to be a process it may take some time to get control. Dr. Raymondo BandKoval to review management next week and possibly add additions including oral medications. Patient nontolerant to metformin and Byetta, we'll consider Victoza.

## 2013-05-30 NOTE — Patient Instructions (Signed)

## 2013-05-31 ENCOUNTER — Encounter: Payer: Self-pay | Admitting: Family Medicine

## 2013-05-31 LAB — MICROALBUMIN / CREATININE URINE RATIO
CREATININE, URINE: 72.2 mg/dL
MICROALB UR: 9.39 mg/dL — AB (ref 0.00–1.89)
Microalb Creat Ratio: 130.1 mg/g — ABNORMAL HIGH (ref 0.0–30.0)

## 2013-06-07 ENCOUNTER — Ambulatory Visit: Payer: Medicaid Other | Admitting: Pharmacist

## 2013-06-12 ENCOUNTER — Encounter: Payer: Self-pay | Admitting: Pharmacist

## 2013-06-12 ENCOUNTER — Ambulatory Visit (INDEPENDENT_AMBULATORY_CARE_PROVIDER_SITE_OTHER): Payer: Medicaid Other

## 2013-06-12 ENCOUNTER — Ambulatory Visit (INDEPENDENT_AMBULATORY_CARE_PROVIDER_SITE_OTHER): Payer: Medicaid Other | Admitting: Pharmacist

## 2013-06-12 VITALS — BP 136/93 | HR 95 | Ht 62.5 in | Wt 249.2 lb

## 2013-06-12 DIAGNOSIS — E118 Type 2 diabetes mellitus with unspecified complications: Principal | ICD-10-CM

## 2013-06-12 DIAGNOSIS — E1165 Type 2 diabetes mellitus with hyperglycemia: Secondary | ICD-10-CM

## 2013-06-12 DIAGNOSIS — IMO0002 Reserved for concepts with insufficient information to code with codable children: Secondary | ICD-10-CM

## 2013-06-12 MED ORDER — INSULIN GLARGINE 100 UNIT/ML SOLOSTAR PEN: 60.0000 [IU] | PEN_INJECTOR | Freq: Every day | SUBCUTANEOUS | Status: DC

## 2013-06-12 MED ORDER — INSULIN ASPART 100 UNIT/ML FLEXPEN: PEN_INJECTOR | SUBCUTANEOUS | Status: DC

## 2013-06-12 NOTE — Assessment & Plan Note (Addendum)
Diabetes diagnosed in  2004 currently with improved control due to improved adherence and improved dosing regimen.   Denies hypoglycemic events and is able to verbalize appropriate hypoglycemia management plan.  Reports adherence with medication.  Control is suboptimal due to inadequate dose of prandial insulin.  Continued basal insulin Lantus (insulin glargine) at 60 units qAM.  Increased dose of rapid insulin Novolog (insulin aspart) to 12, 22, and 17 units prior to each of three major meals. Additionally, she was given instruction to increase breakfast to 15 units if pre-lunch remains > 200.  Consider Addition of SGLT-2 treatment at next Rx clinic visit.  Written patient instructions provided.  Follow up in Pharmacist Clinic Visit at the end of June.   Total time in face to face counseling 20 minutes.  Patient seen with Conception Chancy, PharmD Candidate;  Harvie Junior,  PharmD Resident; Forestine Na, PharmD Resident.

## 2013-06-12 NOTE — Patient Instructions (Addendum)
Increase you Novolog by 2 units with each dose.  12 units with Breakfast, 22 units with lunch and 17 units with dinner.  Increase breakfast to 15 units if pre-lunch remains > 200  Please remember to bring your meter to the next visit.  Next visit in late June in Rx clinic.

## 2013-06-12 NOTE — Progress Notes (Signed)
S:    Patient arrives in good spirits without complaint.    Presents for diabetes follow up after change from insulin pump. Patient reports having history of Diabetes since 2004. Patient has recently transitioned off of her insulin pump and states she is doing better on Lantus and Novolog regimen.    Denies hypoglycemia symptoms.    O:  . Lab Results  Component Value Date   HGBA1C >14.0 05/23/2013     home fasting CBG readings of 122 with increased dose of Lantus 60 units QAM 2 hour post-prandial/random CBG readings of 200-250.  A/P: Diabetes diagnosed in  2004 currently with improved control due to improved adherence and improved dosing regimen.   Denies hypoglycemic events and is able to verbalize appropriate hypoglycemia management plan.  Reports adherence with medication.  Control is suboptimal due to inadequate dose of prandial insulin.  Continued basal insulin Lantus (insulin glargine) at 60 units qAM.  Increased dose of rapid insulin Novolog (insulin aspart) to 12, 22, and 17 units prior to each of three major meals. Additionally, she was given instruction to increase breakfast to 15 units if pre-lunch remains > 200.  Written patient instructions provided.  Follow up in Pharmacist Clinic Visit at the end of June.   Total time in face to face counseling 20 minutes.  Patient seen with Conception Chancy, PharmD Candidate;  Harvie Junior,  PharmD Resident; Forestine Na, PharmD Resident.

## 2013-06-13 NOTE — Progress Notes (Signed)
Patient ID: Karina [name], female   DOB: 1985-09-04, 28 y.o.   MRN: [id number] Reviewed: Agree with Dr. Macky Lower documentation and management.

## 2013-06-20 ENCOUNTER — Telehealth: Payer: Self-pay | Admitting: Family Medicine

## 2013-06-20 ENCOUNTER — Ambulatory Visit (INDEPENDENT_AMBULATORY_CARE_PROVIDER_SITE_OTHER): Payer: Medicaid Other | Admitting: Family Medicine

## 2013-06-20 ENCOUNTER — Other Ambulatory Visit (HOSPITAL_COMMUNITY)
Admission: RE | Admit: 2013-06-20 | Discharge: 2013-06-20 | Disposition: A | Discharge status: Home / Self Care | Payer: Medicaid Other | Source: Ambulatory Visit | Attending: Family Medicine | Admitting: Family Medicine

## 2013-06-20 VITALS — BP 145/79 | HR 91 | Temp 98.8°F | Ht 62.0 in

## 2013-06-20 DIAGNOSIS — IMO0002 Reserved for concepts with insufficient information to code with codable children: Secondary | ICD-10-CM

## 2013-06-20 DIAGNOSIS — E118 Type 2 diabetes mellitus with unspecified complications: Secondary | ICD-10-CM

## 2013-06-20 DIAGNOSIS — Z124 Encounter for screening for malignant neoplasm of cervix: Secondary | ICD-10-CM | POA: Insufficient documentation

## 2013-06-20 DIAGNOSIS — Z1151 Encounter for screening for human papillomavirus (HPV): Secondary | ICD-10-CM | POA: Insufficient documentation

## 2013-06-20 DIAGNOSIS — E1165 Type 2 diabetes mellitus with hyperglycemia: Secondary | ICD-10-CM

## 2013-06-20 DIAGNOSIS — N898 Other specified noninflammatory disorders of vagina: Secondary | ICD-10-CM

## 2013-06-20 LAB — POCT WET PREP (WET MOUNT)
Clue Cells Wet Prep Whiff POC: NEGATIVE
WBC, Wet Prep HPF POC: <5

## 2013-06-20 NOTE — Patient Instructions (Addendum)
Please change her insulin dose to 17 in the morning, 22 at lunch, 17 at night. Continue take 60 units of Lantus in the morning. He should followup with Dr. Raymondo Band at the end of the month. We will call you with the results of your Pap smear. I want you to monitor your blood pressure if you go to a pharmacy has a cough and write them down for me as well.

## 2013-06-20 NOTE — Telephone Encounter (Signed)
Left message on patient's voicemail.Karina [name] S  

## 2013-06-20 NOTE — Telephone Encounter (Signed)
Please call Karina [name] tell her that her wet prep was completely normal no signs of bacteria or yeast infection. Thanks

## 2013-06-20 NOTE — Assessment & Plan Note (Signed)
Diabetic plan change today she is now to take 17 units in the morning, 22 at lunch, 17 in the evening. 260 units of Lantus as scheduled prior. Patient's blood pressure was higher than prior, repeat blood pressure was 127/68. Patient to monitor her blood pressures if it remains normal will start low-dose lisinopril kidney protection. Prior blood pressure readings have always been a little bit too low to consider lisinopril.

## 2013-06-20 NOTE — Telephone Encounter (Signed)
Left message on pati

## 2013-06-20 NOTE — Assessment & Plan Note (Signed)
PAP and wet prep completed today. Pt with h/o colposcopy and the 3 normal PAP after at another facility.

## 2013-06-20 NOTE — Progress Notes (Signed)
   Subjective:    Patient ID: Kyra Leyland, female    DOB: [birth year], 28 y.o.   MRN: [id number]  HPI Keyly Demetrius Charity Joseph Art is 28 y.o. African American female presents to family medicine clinic today for diabetes followup and Pap smear.  Well woman: Since last menstrual period was or per week ago lasted 7 days. She reports 3 days of heavy and then very light period thereafter. She states her periods are approximately every 28-32 days. She reports an abnormal Pap a couple years ago where she had a colposcopy completed. She reports 3 normal Pap after her colpo. She has no family history of breast cancer or colon cancer.  Diabetes: Patient is able to read back her diabetes regimen at 30 units of Lantus in the morning, 15 units of NovoLog at breakfast, 22 units at lunch and 17 units at dinner. Patient states that she has been seeing sugars still greater than 200 prelunch, currently averaging 230. She has an appointment with Dr. Raymondo Band at the end of June discussed other possible oral medication such as GLP 2 inhibitors. She reports exercising 150 minutes at least a week now. She is also watching her diet more closely and is awaiting her nutrition referral.   Review of Systems Negative, with the exception of above mentioned in HPI     Objective:   Physical Exam BP 145/79  Pulse 91  Temp(Src) 98.8 F (37.1 C) (Oral)  Ht 5\' 2"  (1.575 m)  LMP 05/05/2013 Gen: No acute distress, nontoxic in appearance, well-developed, obese African American female. GYN:  External genitalia within normal limits.  Vaginal mucosa pink, moist, normal rugae.  Nonfriable cervix without lesions, no discharge or bleeding noted on speculum exam.  Bimanual exam revealed normal, nongravid uterus.  No cervical motion tenderness. No adnexal masses bilaterally.

## 2013-06-21 LAB — CYTOLOGY - PAP

## 2013-06-27 ENCOUNTER — Ambulatory Visit (INDEPENDENT_AMBULATORY_CARE_PROVIDER_SITE_OTHER): Payer: Medicaid Other

## 2013-06-27 ENCOUNTER — Telehealth: Payer: Self-pay | Admitting: Family Medicine

## 2013-06-27 VITALS — BP 149/95 | HR 101 | Resp 16 | Ht 62.0 in | Wt 245.0 lb

## 2013-06-27 DIAGNOSIS — Q828 Other specified congenital malformations of skin: Secondary | ICD-10-CM

## 2013-06-27 DIAGNOSIS — Z0000 Encounter for general adult medical examination without abnormal findings: Secondary | ICD-10-CM | POA: Insufficient documentation

## 2013-06-27 DIAGNOSIS — E1142 Type 2 diabetes mellitus with diabetic polyneuropathy: Secondary | ICD-10-CM

## 2013-06-27 DIAGNOSIS — B351 Tinea unguium: Secondary | ICD-10-CM

## 2013-06-27 DIAGNOSIS — E1140 Type 2 diabetes mellitus with diabetic neuropathy, unspecified: Secondary | ICD-10-CM

## 2013-06-27 DIAGNOSIS — E1149 Type 2 diabetes mellitus with other diabetic neurological complication: Secondary | ICD-10-CM

## 2013-06-27 MED ORDER — CICLOPIROX 8 % EX SOLN: Freq: Every day | CUTANEOUS | Status: DC

## 2013-06-27 NOTE — Telephone Encounter (Signed)
Please call Karina [name] and inform her that her PAP had slightly abnormal cells. This is usually cleared by the body and rarely progresses to be a concern. However, recommendations are to repeat her PAP in 12 months. Thanks.

## 2013-06-27 NOTE — Progress Notes (Signed)
   Subjective:    Patient ID: Karina [name], female    DOB: 1985/10/11, 28 y.o.   MRN: [id number]  HPI Comments: N diabetic foot exam, peripheral neuropathy exam L B/L feet D diabetic since 2004     Peripheral neuropathy symptoms began early 2015 O C burning across the top of the toes 1 - 4 B/L A elevated glucose T Dr. Claiborne BillingsKuneff prescribed Neurotin, [facility]  [facility]      Review of Systems  All other systems reviewed and are negative.      Objective:   Physical Exam 28 year old female process this time well-developed well-nourished oriented x3 for diabetic foot exam and evaluation agent has a long-standing history of diabetes which recently poor control last A1c was apparently 13 or higher or they're working on that bringing it down she is getting see your Dr. and that hopefully will manage her diabetes and warm stable basis. Patient is presents this time for diabetic foot evaluation  Dorsally objective findings vascular status appears to be intact with pedal pulses palpable DP +2/4 bilateral PT plus one over 4 bilateral capillary refill time 3 seconds all digits epicritic and proprioceptive sensations intact bilateral some diminished sensation to the forefoot and inferior heel bilateral our intact sensation of dorsum of foot digits and arch and ankles patient does have some slight paresthesia abnormal sensation at times has been taking gabapentin for that. There is normal plantar response DTRs not elicited dermatologically skin color pigment normal hair growth absent nails somewhat thickened friable criptotic discolored and brittle patient is been applying nail polish and presents this time for requesting something for nail care treatment she is able cut her nails however would like to get rid of the fungus and we discussed topical nail antifungal options at this time. 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 Patient will obtain a generic for Penlac apply once daily as instructed to the affected nails for 12 months duration once weekly we'll remove an alcohol or nail polish remover as instructed suggest a 6 month followup for reevaluation diabetic foot check. Also may recommendations to wear socks with her shoes at all times no barefoot no flip-flops or flimsy shoes diabetic footcare instruction sheet is dispensed for patient's use reschedule for 6 months reevaluation next  Alvan Dameichard Sikora DPM

## 2013-06-27 NOTE — Telephone Encounter (Signed)
Spoke with patient and informed her of below 

## 2013-06-27 NOTE — Patient Instructions (Signed)
Diabetes and Foot Care Diabetes may cause you to have problems because of poor blood supply (circulation) to your feet and legs. This may cause the skin on your feet to become thinner, break easier, and heal more slowly. Your skin may become dry, and the skin may peel and crack. You may also have nerve damage in your legs and feet causing decreased feeling in them. You may not notice minor injuries to your feet that could lead to infections or more serious problems. Taking care of your feet is one of the most important things you can do for yourself.  HOME CARE INSTRUCTIONS  Wear shoes at all times, even in the house. Do not go barefoot. Bare feet are easily injured.  Check your feet daily for blisters, cuts, and redness. If you cannot see the bottom of your feet, use a mirror or ask someone for help.  Wash your feet with warm water (do not use hot water) and mild soap. Then pat your feet and the areas between your toes until they are completely dry. Do not soak your feet as this can dry your skin.  Apply a moisturizing lotion or petroleum jelly (that does not contain alcohol and is unscented) to the skin on your feet and to dry, brittle toenails. Do not apply lotion between your toes.  Trim your toenails straight across. Do not dig under them or around the cuticle. File the edges of your nails with an emery board or nail file.  Do not cut corns or calluses or try to remove them with medicine.  Wear clean socks or stockings every day. Make sure they are not too tight. Do not wear knee-high stockings since they may decrease blood flow to your legs.  Wear shoes that fit properly and have enough cushioning. To break in new shoes, wear them for just a few hours a day. This prevents you from injuring your feet. Always look in your shoes before you put them on to be sure there are no objects inside.  Do not cross your legs. This may decrease the blood flow to your feet.  If you find a minor scrape,  cut, or break in the skin on your feet, keep it and the skin around it clean and dry. These areas may be cleansed with mild soap and water. Do not cleanse the area with peroxide, alcohol, or iodine.  When you remove an adhesive bandage, be sure not to damage the skin around it.  If you have a wound, look at it several times a day to make sure it is healing.  Do not use heating pads or hot water bottles. They may burn your skin. If you have lost feeling in your feet or legs, you may not know it is happening until it is too late.  Make sure your health care Millisa Giarrusso performs a complete foot exam at least annually or more often if you have foot problems. Report any cuts, sores, or bruises to your health care Riyanshi Wahab immediately. SEEK MEDICAL CARE IF:   You have an injury that is not healing.  You have cuts or breaks in the skin.  You have an ingrown nail.  You notice redness on your legs or feet.  You feel burning or tingling in your legs or feet.  You have pain or cramps in your legs and feet.  Your legs or feet are numb.  Your feet always feel cold. SEEK IMMEDIATE MEDICAL CARE IF:   There is increasing redness,   swelling, or pain in or around a wound.  There is a red line that goes up your leg.  Pus is coming from a wound.  You develop a fever or as directed by your health care Alene Bergerson.  You notice a bad smell coming from an ulcer or wound. Document Released: 12/26/1999 Document Revised: 08/30/2012 Document Reviewed: 06/06/2012 Exit[employer] Patient Information 2015 [employer]. This information is not intended to replace advice given to you by your health care Marylu Dudenhoeffer. Make sure you discuss any questions you have with your health care Grae Leathers.  

## 2013-07-03 ENCOUNTER — Ambulatory Visit: Payer: Medicaid Other | Admitting: Pharmacist

## 2013-07-03 ENCOUNTER — Telehealth: Payer: Self-pay | Admitting: Pharmacist

## 2013-07-03 NOTE — Telephone Encounter (Signed)
Contacted via phone after she was a no show.  Patient apologized for missing appt.  CBGs. AQM low 100s and post lunch 130s.   Overall less than 200  Follow up in July in [facility]. Pt agreed.

## 2013-07-10 ENCOUNTER — Other Ambulatory Visit: Payer: Self-pay | Admitting: Family Medicine

## 2013-07-10 ENCOUNTER — Telehealth: Payer: Self-pay | Admitting: Family Medicine

## 2013-07-10 DIAGNOSIS — IMO0002 Reserved for concepts with insufficient information to code with codable children: Secondary | ICD-10-CM

## 2013-07-10 DIAGNOSIS — E1165 Type 2 diabetes mellitus with hyperglycemia: Secondary | ICD-10-CM

## 2013-07-10 DIAGNOSIS — E118 Type 2 diabetes mellitus with unspecified complications: Principal | ICD-10-CM

## 2013-07-10 MED ORDER — INSULIN GLARGINE 100 UNIT/ML SOLOSTAR PEN: 60.0000 [IU] | PEN_INJECTOR | Freq: Every day | SUBCUTANEOUS | Status: DC

## 2013-07-10 MED ORDER — INSULIN ASPART 100 UNIT/ML FLEXPEN: PEN_INJECTOR | SUBCUTANEOUS | Status: DC

## 2013-07-10 NOTE — Telephone Encounter (Signed)
Needs a 90 supply of novolog and lantus due to insurance changes. Please try to do this today [facility] on [address]

## 2013-08-12 ENCOUNTER — Encounter (HOSPITAL_COMMUNITY): Payer: Self-pay | Admitting: Emergency Medicine

## 2013-08-12 ENCOUNTER — Emergency Department (HOSPITAL_COMMUNITY)
Admission: EM | Admit: 2013-08-12 | Discharge: 2013-08-12 | Disposition: A | Discharge status: Home / Self Care | Payer: Medicaid Other | Attending: Emergency Medicine | Admitting: Emergency Medicine

## 2013-08-12 DIAGNOSIS — H9209 Otalgia, unspecified ear: Secondary | ICD-10-CM | POA: Diagnosis not present

## 2013-08-12 DIAGNOSIS — IMO0001 Reserved for inherently not codable concepts without codable children: Secondary | ICD-10-CM | POA: Diagnosis not present

## 2013-08-12 DIAGNOSIS — Z794 Long term (current) use of insulin: Secondary | ICD-10-CM | POA: Insufficient documentation

## 2013-08-12 DIAGNOSIS — Z791 Long term (current) use of non-steroidal anti-inflammatories (NSAID): Secondary | ICD-10-CM | POA: Insufficient documentation

## 2013-08-12 DIAGNOSIS — J029 Acute pharyngitis, unspecified: Secondary | ICD-10-CM | POA: Insufficient documentation

## 2013-08-12 DIAGNOSIS — E1165 Type 2 diabetes mellitus with hyperglycemia: Secondary | ICD-10-CM

## 2013-08-12 DIAGNOSIS — E119 Type 2 diabetes mellitus without complications: Secondary | ICD-10-CM | POA: Diagnosis not present

## 2013-08-12 LAB — CBG MONITORING, ED: Glucose-Capillary: 323 mg/dL — ABNORMAL HIGH (ref 70–99)

## 2013-08-12 LAB — RAPID STREP SCREEN (MED CTR MEBANE ONLY): STREPTOCOCCUS, GROUP A SCREEN (DIRECT): NEGATIVE

## 2013-08-12 MED ORDER — INSULIN GLARGINE 100 UNIT/ML SOLOSTAR PEN: 60.0000 [IU] | PEN_INJECTOR | Freq: Every day | SUBCUTANEOUS | Status: DC

## 2013-08-12 MED ORDER — HYDROCODONE-HOMATROPINE 5-1.5 MG/5ML PO SYRP: 5.0000 mL | ORAL_SOLUTION | Freq: Four times a day (QID) | ORAL | Status: DC | PRN

## 2013-08-12 MED ORDER — AZITHROMYCIN 250 MG PO TABS: ORAL_TABLET | ORAL | Status: DC

## 2013-08-12 MED ORDER — INSULIN ASPART 100 UNIT/ML FLEXPEN: 2.0000 [IU] | PEN_INJECTOR | Freq: Three times a day (TID) | SUBCUTANEOUS | Status: DC

## 2013-08-12 MED ORDER — INSULIN PEN NEEDLE 31G X 8 MM MISC: Status: DC

## 2013-08-12 NOTE — Discharge Instructions (Signed)

## 2013-08-12 NOTE — ED Provider Notes (Signed)
Medical screening examination/treatment/procedure(s) were performed by non-physician practitioner and as supervising physician I was immediately available for consultation/collaboration.   EKG Interpretation None       Doug SouSam Lashawndra Lampkins, MD 08/12/13 [phone]

## 2013-08-12 NOTE — ED Provider Notes (Signed)
CSN: [id number]     Arrival date & time 08/12/13  0557 History   First MD Initiated Contact with Patient 08/12/13 [phone]     Chief Complaint  Patient presents with  . Sore Throat     (Consider location/radiation/quality/duration/timing/severity/associated sxs/prior Treatment) HPI Comments: Diabetic female with sore throat, progressively worseing over the past 4 days. Worse on the RIght. Pain with swallowing. No trismus, but pain with jaw movement and R ear pain. Out of her insulin for the past 3 days.  Patient is a 28 y.o. female presenting with pharyngitis. The history is provided by the patient and medical records. No language interpreter was used.  Sore Throat This is a new problem. The current episode started in the past 7 days. The problem occurs constantly. The problem has been gradually worsening. Associated symptoms include diaphoresis, myalgias and neck pain. Pertinent negatives include no abdominal pain, anorexia, arthralgias, change in bowel habit, chest pain, chills, congestion, coughing, fatigue, fever, headaches, joint swelling, nausea, numbness, rash, sore throat, swollen glands, urinary symptoms, vertigo, visual change, vomiting or weakness. The symptoms are aggravated by swallowing. She has tried acetaminophen and NSAIDs for the symptoms. The treatment provided mild relief.    Past Medical History  Diagnosis Date  . Diabetes mellitus     Type 2, insulin resistant   Past Surgical History  Procedure Laterality Date  . Cervical biopsy  2004  . Cesarean section N/A 06/02/2012    Procedure: CESAREAN SECTION;  Surgeon: Catalina Antigua, MD;  Location: [facility];  Service: Obstetrics;  Laterality: N/A;   Family History  Problem Relation Age of Onset  . Diabetes Father    History  Substance Use Topics  . Smoking status: Never Smoker   . Smokeless tobacco: Never Used  . Alcohol Use: No   OB History   Grav Para Term Preterm Abortions TAB SAB Ect Mult Living   1 1 1       1        Review of Systems  Constitutional: Positive for diaphoresis, activity change and appetite change. Negative for fever, chills and fatigue.  HENT: Positive for ear pain and facial swelling. Negative for congestion and sore throat.   Respiratory: Negative for cough.   Cardiovascular: Negative for chest pain.  Gastrointestinal: Negative for nausea, vomiting, abdominal pain, anorexia and change in bowel habit.  Musculoskeletal: Positive for myalgias and neck pain. Negative for arthralgias and joint swelling.  Skin: Negative for rash.  Neurological: Negative for vertigo, weakness, numbness and headaches.  All other systems reviewed and are negative.     Allergies  Review of patient's allergies indicates no known allergies.  Home Medications   Prior to Admission medications   Medication Sig Start Date End Date Taking? Authorizing Provider  Dextromethorphan-Guaifenesin (MUCINEX DM PO) Take 20 mLs by mouth 3 (three) times daily as needed (for cough/congestion).   Yes Historical Provider, MD  ibuprofen (ADVIL,MOTRIN) 200 MG tablet Take 600 mg by mouth every 6 (six) hours as needed for moderate pain.   Yes Historical Provider, MD  Insulin Aspart (NOVOLOG FLEXPEN [place]) Inject 2-17 Units into the skin 3 (three) times daily before meals. Sliding scale   Yes Historical Provider, MD  Insulin Glargine (LANTUS) 100 UNIT/ML Solostar Pen Inject 60 Units into the skin daily at 6 (six) AM. Dispense QS for 1 month supply 07/10/13  Yes Renee A Kuneff, DO  Insulin Pen Needle 31G X 8 MM MISC BD UltraFine III Pen Needles. For use with insulin pen device.  Inject insulin 6 x daily 05/23/13   Renee A Kuneff, DO   BP 129/77  Pulse 85  Temp(Src) 98.1 F (36.7 C) (Oral)  Resp 20  Ht 5\' 2"  (1.575 m)  Wt 254 lb (115.214 kg)  BMI 46.45 kg/m2  SpO2 99% Physical Exam  Nursing note and vitals reviewed. Constitutional: She is oriented to person, place, and time. She appears well-developed and well-nourished. No  distress.  HENT:  Head: Normocephalic and atraumatic.  Right Ear: Hearing, tympanic membrane, external ear and ear canal normal.  Left Ear: Hearing, tympanic membrane, external ear and ear canal normal.  Mouth/Throat: Uvula is midline. No trismus in the jaw. Posterior oropharyngeal erythema present. No tonsillar abscesses.    Eyes: Conjunctivae are normal. No scleral icterus.  Neck: Normal range of motion.  Cardiovascular: Normal rate, regular rhythm and normal heart sounds.  Exam reveals no gallop and no friction rub.   No murmur heard. Pulmonary/Chest: Effort normal and breath sounds normal. No respiratory distress.  Abdominal: Soft. Bowel sounds are normal. She exhibits no distension and no mass. There is no tenderness. There is no guarding.  Lymphadenopathy:    She has cervical adenopathy (R side cervical anterior cervical adenopathy).  Neurological: She is alert and oriented to person, place, and time.  Skin: Skin is warm and dry. She is not diaphoretic.    ED Course  Procedures (including critical care time) Labs Review Labs Reviewed  RAPID STREP SCREEN  CULTURE, GROUP A STREP    Imaging Review No results found.   EKG Interpretation None      MDM   Final diagnoses:  Sore throat    8:06 AM BP 115/74  Pulse 85  Temp(Src) 98.1 F (36.7 C) (Oral)  Resp 16  Ht 5\' 2"  (1.575 m)  Wt 254 lb (115.214 kg)  BMI 46.45 kg/m2  SpO2 96% CBG (last 3)   Recent Labs  08/12/13 0752  GLUCAP 323*     Patient hyperglycemic. i have refilled her insulin.  zpack and hycodan. F/u this week with Dr. Claiborne BillingsKuneff at [facility]     Arthor CaptainAbigail Saabir Blyth, PA-C 08/12/13 [phone]

## 2013-08-12 NOTE — ED Notes (Signed)
The pt has had a sore throat for 4 days no temp

## 2013-08-14 LAB — CULTURE, GROUP A STREP

## 2013-09-27 ENCOUNTER — Emergency Department (HOSPITAL_COMMUNITY)
Admission: EM | Admit: 2013-09-27 | Discharge: 2013-09-28 | Disposition: A | Discharge status: Home / Self Care | Payer: Medicaid Other | Attending: Emergency Medicine | Admitting: Emergency Medicine

## 2013-09-27 ENCOUNTER — Encounter (HOSPITAL_COMMUNITY): Payer: Self-pay | Admitting: Emergency Medicine

## 2013-09-27 DIAGNOSIS — E119 Type 2 diabetes mellitus without complications: Secondary | ICD-10-CM | POA: Diagnosis not present

## 2013-09-27 DIAGNOSIS — L0291 Cutaneous abscess, unspecified: Secondary | ICD-10-CM

## 2013-09-27 DIAGNOSIS — T8140XA Infection following a procedure, unspecified, initial encounter: Secondary | ICD-10-CM | POA: Diagnosis not present

## 2013-09-27 DIAGNOSIS — Y838 Other surgical procedures as the cause of abnormal reaction of the patient, or of later complication, without mention of misadventure at the time of the procedure: Secondary | ICD-10-CM | POA: Insufficient documentation

## 2013-09-27 DIAGNOSIS — L03319 Cellulitis of trunk, unspecified: Secondary | ICD-10-CM

## 2013-09-27 DIAGNOSIS — Z4801 Encounter for change or removal of surgical wound dressing: Secondary | ICD-10-CM | POA: Insufficient documentation

## 2013-09-27 DIAGNOSIS — Z794 Long term (current) use of insulin: Secondary | ICD-10-CM | POA: Diagnosis not present

## 2013-09-27 DIAGNOSIS — L0390 Cellulitis, unspecified: Secondary | ICD-10-CM

## 2013-09-27 DIAGNOSIS — L02219 Cutaneous abscess of trunk, unspecified: Secondary | ICD-10-CM | POA: Diagnosis not present

## 2013-09-27 DIAGNOSIS — R21 Rash and other nonspecific skin eruption: Secondary | ICD-10-CM | POA: Insufficient documentation

## 2013-09-27 NOTE — ED Provider Notes (Signed)
CSN: [id number]     Arrival date & time 09/27/13  2116 History  This chart was scribed for non-physician practitioner working with Olivia Mackie, MD, by Roxy Cedar ED Scribe. This patient was seen in room WTR8/WTR8 and the patient's care was started at 11:05 PM    Chief Complaint  Patient presents with  . Wound Check   The history is provided by the patient. No language interpreter was used.   HPI Comments: Karina [name] is a 28 y.o. female who presents to the Emergency Department complaining of intermittent pain from a boil in the incision line from c-section that "busted" 3 days ago and has been intermittently bleeding. Patient describes pain as stinging and burning. Patient denies any pus drainage from area. Patient denies taking any antibiotics. Patient states she placed gauze in area to collect fluid last night, but has not applied any today. She states that she is not allergic to any antibiotics.  Past Medical History  Diagnosis Date  . Diabetes mellitus     Type 2, insulin resistant   Past Surgical History  Procedure Laterality Date  . Cervical biopsy  2004  . Cesarean section N/A 06/02/2012    Procedure: CESAREAN SECTION;  Surgeon: Catalina Antigua, MD;  Location: [facility];  Service: Obstetrics;  Laterality: N/A;   Family History  Problem Relation Age of Onset  . Diabetes Father    History  Substance Use Topics  . Smoking status: Never Smoker   . Smokeless tobacco: Never Used  . Alcohol Use: No   OB History   Grav Para Term Preterm Abortions TAB SAB Ect Mult Living   Review of Systems  Constitutional: Negative for fever and chills.  Genitourinary: Negative for vaginal bleeding, vaginal discharge and vaginal pain.  Skin: Positive for rash.       Abscess in suprapubic area on c-section incision line.  All other systems reviewed and are negative.  Allergies  Review of patient's allergies indicates no known allergies.  Home Medications   Prior  to Admission medications   Medication Sig Start Date End Date Taking? Authorizing Provider  azithromycin (ZITHROMAX Z-PAK) 250 MG tablet 2 po day one, then 1 daily x 4 days 08/12/13   Arthor Captain, PA-C  cephALEXin (KEFLEX) 500 MG capsule Take 1 capsule (500 mg total) by mouth 4 (four) times daily. 09/28/13   Avrum Kimball Irine Seal, PA-C  Dextromethorphan-Guaifenesin (MUCINEX DM PO) Take 20 mLs by mouth 3 (three) times daily as needed (for cough/congestion).    Historical Provider, MD  HYDROcodone-homatropine (HYCODAN) 5-1.5 MG/5ML syrup Take 5 mLs by mouth every 6 (six) hours as needed for cough. 08/12/13   Arthor Captain, PA-C  ibuprofen (ADVIL,MOTRIN) 200 MG tablet Take 600 mg by mouth every 6 (six) hours as needed for moderate pain.    Historical Provider, MD  insulin aspart (NOVOLOG FLEXPEN) 100 UNIT/ML FlexPen Inject 2-17 Units into the skin 3 (three) times daily with meals. 08/12/13   Arthor Captain, PA-C  Insulin Glargine (LANTUS) 100 UNIT/ML Solostar Pen Inject 60 Units into the skin daily at 6 (six) AM. Dispense QS for 1 month supply 08/12/13   Arthor Captain, PA-C  Insulin Pen Needle 31G X 8 MM MISC BD UltraFine III Pen Needles. For use with insulin pen device. Inject insulin 6 x daily 08/12/13   Arthor Captain, PA-C  sulfamethoxazole-trimethoprim (SEPTRA DS) 800-160 MG per tablet Take 1 tablet by  mouth every 12 (twelve) hours. 09/28/13   Dominique Ressel Irine Seal, PA-C  traMADol (ULTRAM) 50 MG tablet Take 1 tablet (50 mg total) by mouth every 6 (six) hours as needed. 09/28/13   Dorthula Matas, PA-C   Triage Vitals: BP 130/77  Pulse 95  Temp(Src) 98.2 F (36.8 C) (Oral)  Resp 20  SpO2 100%  LMP 08/18/2013  Physical Exam  Nursing note and vitals reviewed. Constitutional: She appears well-developed and well-nourished.  HENT:  Head: Normocephalic and atraumatic.  Eyes: Conjunctivae are normal. Right eye exhibits no discharge. Left eye exhibits no discharge.  Pulmonary/Chest: Effort normal. No respiratory  distress.  Neurological: She is alert. Coordination normal.  Skin: Skin is warm and dry. No rash noted. She is not diaphoretic. No erythema.  Non abdominal pain. Pain to 1 x 2 cm area of cellulitis. With small abscess that has opened and is draining a small amount of fluid.  Psychiatric: She has a normal mood and affect.    ED Course  Procedures (including critical care time)  DIAGNOSTIC STUDIES: Oxygen Saturation is 100% on RA, normal by my interpretation.    COORDINATION OF CARE: 11:46 PM- Will give patient antibiotic medications to prevent further infection of skin. Pt advised of plan for treatment and pt agrees.  Labs Review Labs Reviewed - No data to display  Imaging Review No results found.   EKG Interpretation None     MDM   Final diagnoses:  Abscess and cellulitis    Abscess is open and draining. Wound expressed and no further puss or blood expressing. Cellulitis present. Will give:  Medications  cephALEXin (KEFLEX) capsule 500 mg (not administered)  sulfamethoxazole-trimethoprim (BACTRIM DS) 800-160 MG per tablet 1 tablet (not administered)    Also prescribed for the same and abx. Pt to follow-up with PCP and return to the ED if red flag symptoms are present. Afebrile/non systemic symptoms present.  28 y.o.Malley P Woods's evaluation in the Emergency Department is complete. It has been determined that no acute conditions requiring further emergency intervention are present at this time. The patient/guardian have been advised of the diagnosis and plan. We have discussed signs and symptoms that warrant return to the ED, such as changes or worsening in symptoms.  Vital signs are stable at discharge. Filed Vitals:   09/27/13 2157  BP: 130/77  Pulse: 95  Temp: 98.2 F (36.8 C)  Resp: 20    Patient/guardian has voiced understanding and agreed to follow-up with the PCP or specialist.  I personally performed the services described in this documentation,  which was scribed in my presence. The recorded information has been reviewed and is accurate.   Dorthula Matas, PA-C 09/28/13 0010

## 2013-09-27 NOTE — ED Notes (Signed)
Pt states 3 days ago she got an abscess on her c section scar  Pt states it burst and has been bleeding ever since  Pt states the area is very painful  Pt states she has been cleaning it  Pt states it is not draining pus anymore

## 2013-09-28 ENCOUNTER — Telehealth: Payer: Self-pay | Admitting: Family Medicine

## 2013-09-28 MED ORDER — TRAMADOL HCL 50 MG PO TABS: 50.0000 mg | ORAL_TABLET | Freq: Four times a day (QID) | ORAL | Status: DC | PRN

## 2013-09-28 MED ORDER — SULFAMETHOXAZOLE-TRIMETHOPRIM 800-160 MG PO TABS: 1.0000 | ORAL_TABLET | Freq: Two times a day (BID) | ORAL | Status: DC

## 2013-09-28 MED ORDER — SULFAMETHOXAZOLE-TMP DS 800-160 MG PO TABS
1.0000 | ORAL_TABLET | Freq: Once | ORAL | Status: AC
  Administered 2013-09-28: 1 via ORAL
  Filled 2013-09-28: qty 1

## 2013-09-28 MED ORDER — CEPHALEXIN 500 MG PO CAPS: 500.0000 mg | ORAL_CAPSULE | Freq: Four times a day (QID) | ORAL | Status: DC

## 2013-09-28 MED ORDER — CEPHALEXIN 500 MG PO CAPS
500.0000 mg | ORAL_CAPSULE | Freq: Once | ORAL | Status: AC
  Administered 2013-09-28: 500 mg via ORAL
  Filled 2013-09-28: qty 1

## 2013-09-28 NOTE — Discharge Instructions (Signed)
Cellulitis °Cellulitis is an infection of the skin and the tissue beneath it. The infected area is usually red and tender. Cellulitis occurs most often in the arms and lower legs.  °CAUSES  °Cellulitis is caused by bacteria that enter the skin through cracks or cuts in the skin. The most common types of bacteria that cause cellulitis are staphylococci and streptococci. °SIGNS AND SYMPTOMS  °· Redness and warmth. °· Swelling. °· Tenderness or pain. °· Fever. °DIAGNOSIS  °Your health care provider can usually determine what is wrong based on a physical exam. Blood tests may also be done. °TREATMENT  °Treatment usually involves taking an antibiotic medicine. °HOME CARE INSTRUCTIONS  °· Take your antibiotic medicine as directed by your health care provider. Finish the antibiotic even if you start to feel better. °· Keep the infected arm or leg elevated to reduce swelling. °· Apply a warm cloth to the affected area up to 4 times per day to relieve pain. °· Take medicines only as directed by your health care provider. °· Keep all follow-up visits as directed by your health care provider. °SEEK MEDICAL CARE IF:  °· You notice red streaks coming from the infected area. °· Your red area gets larger or turns dark in color. °· Your bone or joint underneath the infected area becomes painful after the skin has healed. °· Your infection returns in the same area or another area. °· You notice a swollen bump in the infected area. °· You develop new symptoms. °· You have a fever. °SEEK IMMEDIATE MEDICAL CARE IF:  °· You feel very sleepy. °· You develop vomiting or diarrhea. °· You have a general ill feeling (malaise) with muscle aches and pains. °MAKE SURE YOU:  °· Understand these instructions. °· Will watch your condition. °· Will get help right away if you are not doing well or get worse. °Document Released: 10/07/2004 Document Revised: 05/14/2013 Document Reviewed: 03/15/2011 °ExitCare® Patient Information ©2015 [employer].  This information is not intended to replace advice given to you by your health care provider. Make sure you discuss any questions you have with your health care provider. ° °Abscess °An abscess is an infected area that contains a collection of pus and debris. It can occur in almost any part of the body. An abscess is also known as a furuncle or boil. °CAUSES  °An abscess occurs when tissue gets infected. This can occur from blockage of oil or sweat glands, infection of hair follicles, or a minor injury to the skin. As the body tries to fight the infection, pus collects in the area and creates pressure under the skin. This pressure causes pain. People with weakened immune systems have difficulty fighting infections and get certain abscesses more often.  °SYMPTOMS °Usually an abscess develops on the skin and becomes a painful mass that is red, warm, and tender. If the abscess forms under the skin, you may feel a moveable soft area under the skin. Some abscesses break open (rupture) on their own, but most will continue to get worse without care. The infection can spread deeper into the body and eventually into the bloodstream, causing you to feel ill.  °DIAGNOSIS  °Your caregiver will take your medical history and perform a physical exam. A sample of fluid may also be taken from the abscess to determine what is causing your infection. °TREATMENT  °Your caregiver may prescribe antibiotic medicines to fight the infection. However, taking antibiotics alone usually does not cure an abscess. Your caregiver may need to   make a small cut (incision) in the abscess to drain the pus. In some cases, gauze is packed into the abscess to reduce pain and to continue draining the area. °HOME CARE INSTRUCTIONS  °· Only take over-the-counter or prescription medicines for pain, discomfort, or fever as directed by your caregiver. °· If you were prescribed antibiotics, take them as directed. Finish them even if you start to feel  better. °· If gauze is used, follow your caregiver's directions for changing the gauze. °· To avoid spreading the infection: °¨ Keep your draining abscess covered with a bandage. °¨ Wash your hands well. °¨ Do not share personal care items, towels, or whirlpools with others. °¨ Avoid skin contact with others. °· Keep your skin and clothes clean around the abscess. °· Keep all follow-up appointments as directed by your caregiver. °SEEK MEDICAL CARE IF:  °· You have increased pain, swelling, redness, fluid drainage, or bleeding. °· You have muscle aches, chills, or a general ill feeling. °· You have a fever. °MAKE SURE YOU:  °· Understand these instructions. °· Will watch your condition. °· Will get help right away if you are not doing well or get worse. °Document Released: 10/07/2004 Document Revised: 06/29/2011 Document Reviewed: 03/12/2011 °ExitCare® Patient Information ©2015 [employer]. This information is not intended to replace advice given to you by your health care provider. Make sure you discuss any questions you have with your health care provider. ° °

## 2013-09-28 NOTE — Telephone Encounter (Signed)
Pt requesting her Novolog to be switched from pens to vials. Her coupon only pays for vials. Pls advise.

## 2013-09-28 NOTE — ED Provider Notes (Signed)
Medical screening examination/treatment/procedure(s) were performed by non-physician practitioner and as supervising physician I was immediately available for consultation/collaboration.   EKG Interpretation None       Dajuan Turnley, MD 09/28/13 0833 

## 2013-10-01 ENCOUNTER — Telehealth: Payer: Self-pay | Admitting: Family Medicine

## 2013-10-01 MED ORDER — INSULIN ASPART 100 UNIT/ML ~~LOC~~ SOLN: 2.0000 [IU] | Freq: Three times a day (TID) | SUBCUTANEOUS | Status: DC

## 2013-10-01 NOTE — Telephone Encounter (Signed)
I have refilled her novolog and changed it to vials per patient request. Patient should be encouraged to make an appointment for diabetes follow up, ithas been 3 months and time to check a1c and review glucose logs. Thanks.

## 2013-10-01 NOTE — Telephone Encounter (Signed)
LM forpatient to callus back.

## 2013-10-02 NOTE — Telephone Encounter (Signed)
LM informing of below 

## 2013-10-08 NOTE — Telephone Encounter (Signed)
Patient did not pick up rx from pharmacy because she is having some issues with them.  Need to have a new rx sent to a different pharmacy.  [facility] on [address].  Please send and let patient know when completed.

## 2013-10-09 ENCOUNTER — Telehealth: Payer: Self-pay | Admitting: Family Medicine

## 2013-10-09 MED ORDER — INSULIN ASPART 100 UNIT/ML ~~LOC~~ SOLN: 2.0000 [IU] | Freq: Three times a day (TID) | SUBCUTANEOUS | Status: DC

## 2013-10-09 NOTE — Telephone Encounter (Signed)
Spoke with patient and gave message below.

## 2013-10-09 NOTE — Telephone Encounter (Signed)
I have sent new script to [facility] on [address]. In the future patient should have scripts transferred between pharmacies, she can call thepharmacy it is located and have them transfer it to the pharmacy she desires. This is the normal procedure. Thanks.

## 2013-10-22 ENCOUNTER — Encounter: Payer: Self-pay | Admitting: Family Medicine

## 2013-10-22 ENCOUNTER — Ambulatory Visit (INDEPENDENT_AMBULATORY_CARE_PROVIDER_SITE_OTHER): Payer: Self-pay | Admitting: Family Medicine

## 2013-10-22 VITALS — BP 141/92 | HR 87 | Temp 98.0°F | Ht 63.0 in | Wt 233.0 lb

## 2013-10-22 DIAGNOSIS — E118 Type 2 diabetes mellitus with unspecified complications: Secondary | ICD-10-CM

## 2013-10-22 DIAGNOSIS — IMO0002 Reserved for concepts with insufficient information to code with codable children: Secondary | ICD-10-CM

## 2013-10-22 DIAGNOSIS — E1165 Type 2 diabetes mellitus with hyperglycemia: Secondary | ICD-10-CM

## 2013-10-22 DIAGNOSIS — E1140 Type 2 diabetes mellitus with diabetic neuropathy, unspecified: Secondary | ICD-10-CM

## 2013-10-22 LAB — POCT GLYCOSYLATED HEMOGLOBIN (HGB A1C): Hemoglobin A1C: 14.2

## 2013-10-22 MED ORDER — INSULIN ASPART 100 UNIT/ML ~~LOC~~ SOLN: 2.0000 [IU] | Freq: Three times a day (TID) | SUBCUTANEOUS | Status: DC

## 2013-10-22 MED ORDER — INSULIN DETEMIR 100 UNIT/ML ~~LOC~~ SOLN: 30.0000 [IU] | Freq: Two times a day (BID) | SUBCUTANEOUS | Status: DC

## 2013-10-22 NOTE — Progress Notes (Signed)
   Subjective:    Patient ID: Karina [name], female    DOB: [birth year], 28 y.o.   MRN: [id number]  HPI Karina [name] is a 28 y.o. female presents for diabetes followup  Diabetes, uncontrolled with neuropathy and retinopathy: Patient presents today for diabetes followup. She has known to be uncontrolled last A1c about 14. She does Experience neuropathy, but she admits to mild improvement with gabapentin use. She has lost her insurance. Her husband's insurance through his work is too expensive for them to afford coverage for her. She has been without her insulin, NovoLog and Lantus, for approximately one month. She was taking some of her relatives metformin to help with her blood glucose. She has not been able to test her blood glucose because she cannot afford the supplies. Patient has a history of moderate nonproliferative diabetic retinopathy of both eyes, seen in exam in May 2015. Followup photographs are recommended in November. Patient denies any nonhealing wounds. She reports she's been extra strength with her diet and exercising she has been out of medications. She states that she doesn't exercise daily she does not feel well.  Review of Systems Per history of present illness    Objective:   Physical Exam BP 141/92  Pulse 87  Temp(Src) 98 F (36.7 C) (Oral)  Ht 5\' 3"  (1.6 m)  Wt 233 lb (105.688 kg)  BMI 41.28 kg/m2  LMP 10/04/2013 Gen: Very pleasant, African American female, obese, no acute distress, nontoxic in appearance. Well-developed, well-nourished. HEENT: AT. [place]. Bilateral eyes without injections or icterus. MMM.  CV: RRR, no murmurs clicks gallops or rubs Chest: CTAB, no wheeze or crackles Abd: Soft. Obese. NTND. BS present. No Masses palpated.  Ext: No erythema. No edema. +2/4 DP/PT Foot exam completed and documented in quality metrics    Assessment & Plan:

## 2013-10-22 NOTE — Patient Instructions (Signed)
It was a pleasure seeing you today.  Please take levemir 25 mg twice a day. If your fasting glucose in the morning is above 110, increase your Levemir very slowly (1 unit)  on the next dose. Continue your NovoLog dose was 2-17 units into the skin 3 times a day prior to meals.  Please make sure you look into the orange card and the MAP assistance program for help with financial medical bills and medications.  If anything changes with her finances, let me know right away so that we can adjust her medications to an ideal medicine on what is on your insurance is formulary. Continue to watch her diet and exercise daily.  I would like to see you back in a month, to discuss your diabetes and to the orange card program or have an insurance by then.  Diabetes Mellitus and Food It is important for you to manage your blood sugar (glucose) level. Your blood glucose level can be greatly affected by what you eat. Eating healthier foods in the appropriate amounts throughout the day at about the same time each day will help you control your blood glucose level. It can also help slow or prevent worsening of your diabetes mellitus. Healthy eating may even help you improve the level of your blood pressure and reach or maintain a healthy weight.  HOW CAN FOOD AFFECT ME? Carbohydrates Carbohydrates affect your blood glucose level more than any other type of food. Your dietitian will help you determine how many carbohydrates to eat at each meal and teach you how to count carbohydrates. Counting carbohydrates is important to keep your blood glucose at a healthy level, especially if you are using insulin or taking certain medicines for diabetes mellitus. Alcohol Alcohol can cause sudden decreases in blood glucose (hypoglycemia), especially if you use insulin or take certain medicines for diabetes mellitus. Hypoglycemia can be a life-threatening condition. Symptoms of hypoglycemia (sleepiness, dizziness, and disorientation)  are similar to symptoms of having too much alcohol.  If your health care provider has given you approval to drink alcohol, do so in moderation and use the following guidelines:  Women should not have more than one drink per day, and men should not have more than two drinks per day. One drink is equal to:  12 oz of beer.  5 oz of wine.  1 oz of hard liquor.  Do not drink on an empty stomach.  Keep yourself hydrated. Have water, diet soda, or unsweetened iced tea.  Regular soda, juice, and other mixers might contain a lot of carbohydrates and should be counted. WHAT FOODS ARE NOT RECOMMENDED? As you make food choices, it is important to remember that all foods are not the same. Some foods have fewer nutrients per serving than other foods, even though they might have the same number of calories or carbohydrates. It is difficult to get your body what it needs when you eat foods with fewer nutrients. Examples of foods that you should avoid that are high in calories and carbohydrates but low in nutrients include:  Trans fats (most processed foods list trans fats on the Nutrition Facts label).  Regular soda.  Juice.  Candy.  Sweets, such as cake, pie, doughnuts, and cookies.  Fried foods. WHAT FOODS CAN I EAT? Have nutrient-rich foods, which will nourish your body and keep you healthy. The food you should eat also will depend on several factors, including:  The calories you need.  The medicines you take.  Your weight.  Your blood glucose level.  Your blood pressure level.  Your cholesterol level. You also should eat a variety of foods, including:  Protein, such as meat, poultry, fish, tofu, nuts, and seeds (lean animal proteins are best).  Fruits.  Vegetables.  Dairy products, such as milk, cheese, and yogurt (low fat is best).  Breads, grains, pasta, cereal, rice, and beans.  Fats such as olive oil, trans fat-free margarine, canola oil, avocado, and olives. DOES  EVERYONE WITH DIABETES MELLITUS HAVE THE SAME MEAL PLAN? Because every person with diabetes mellitus is different, there is not one meal plan that works for everyone. It is very important that you meet with a dietitian who will help you create a meal plan that is just right for you. Document Released: 09/24/2004 Document Revised: 01/02/2013 Document Reviewed: 11/24/2012 [facility] Patient Information 2015 [place], [place]. This information is not intended to replace advice given to you by your health care provider. Make sure you discuss any questions you have with your health care provider.

## 2013-10-22 NOTE — Assessment & Plan Note (Signed)
Patient without any medical insurance or prescription coverage at this time. Orange card and in a MAP assistance information was given to her today A1c completed today Patient has not been taking medications for approximately one month. Diabetic foot exam completed today. Samples of Levemir were given for her to start at 25 units twice a day, taper at one unit per dose if blood sugar in the morning as above 110. Patient in understanding of plan and painful for samples. Sample of NovoLog also given. Hopefully this will bridge patient until she is able to get assistance or insurance. Patient aware I need her to call me right away if her insurance coverage changes so that we may prescribe her a new glucose monitor, supplies and medications approved by her insurance formulary. Followup in one month if able.

## 2013-11-12 ENCOUNTER — Encounter: Payer: Self-pay | Admitting: Family Medicine

## 2013-11-14 ENCOUNTER — Telehealth: Payer: Self-pay | Admitting: Family Medicine

## 2013-11-14 NOTE — Telephone Encounter (Signed)
Patient calls to make Dr. Claiborne BillingsKuneff aware that she now has insurance. I asked patient why MD needed to know this and patient states she could not remember she was just asked by Dr. Claiborne BillingsKuneff to let her know when. Please be advise.

## 2013-11-16 ENCOUNTER — Telehealth: Payer: Self-pay | Admitting: Family Medicine

## 2013-11-16 DIAGNOSIS — IMO0002 Reserved for concepts with insufficient information to code with codable children: Secondary | ICD-10-CM

## 2013-11-16 DIAGNOSIS — E1165 Type 2 diabetes mellitus with hyperglycemia: Secondary | ICD-10-CM

## 2013-11-16 DIAGNOSIS — E118 Type 2 diabetes mellitus with unspecified complications: Principal | ICD-10-CM

## 2013-11-16 MED ORDER — GLUCOSE BLOOD VI STRP: ORAL_STRIP | Status: DC

## 2013-11-16 MED ORDER — ACCU-CHEK NANO SMARTVIEW W/DEVICE KIT: PACK | Status: DC

## 2013-11-16 MED ORDER — ACCU-CHEK FASTCLIX LANCETS MISC: Status: DC

## 2013-11-16 MED ORDER — INSULIN DETEMIR 100 UNIT/ML ~~LOC~~ SOLN: 35.0000 [IU] | Freq: Two times a day (BID) | SUBCUTANEOUS | Status: DC

## 2013-11-16 MED ORDER — INSULIN ASPART 100 UNIT/ML ~~LOC~~ SOLN: 2.0000 [IU] | Freq: Three times a day (TID) | SUBCUTANEOUS | Status: DC

## 2013-11-16 NOTE — Telephone Encounter (Signed)
Please ask the patient what type of insurance she has? This will help guide me in the prescribing of an insulin that is on her formulary and I plan to order her a glucose monitor and supplies, but I will need to know the type of insurance. It would be helpful if she knows what type of insulin her insurance covers (lantus injection vs lantus pen vs levemir) In addition she should make an appointment to follow up, if she has not done so already. As soon as she informs us of the above I will call in supplies and insulin. Thanks.

## 2013-11-16 NOTE — Telephone Encounter (Signed)
[place]

## 2013-11-16 NOTE — Telephone Encounter (Signed)
Discussed management of diabetes with patient. She now has medicaid and can get refills on her insulin. She is up to 35 units of levemir BID. Teach back method with daily increase in levemir, depending on blood glucose, was successful. Pt is to make an appointment in 3 weeks for followup. Refills of levemir and novolog prescribed as well as monitoring device. Thanks.

## 2013-12-12 ENCOUNTER — Ambulatory Visit (INDEPENDENT_AMBULATORY_CARE_PROVIDER_SITE_OTHER): Payer: Medicaid Other | Admitting: Family Medicine

## 2013-12-12 ENCOUNTER — Encounter: Payer: Self-pay | Admitting: Family Medicine

## 2013-12-12 VITALS — BP 149/91 | HR 85 | Temp 98.1°F | Ht 62.0 in | Wt 235.8 lb

## 2013-12-12 DIAGNOSIS — E1140 Type 2 diabetes mellitus with diabetic neuropathy, unspecified: Secondary | ICD-10-CM

## 2013-12-12 DIAGNOSIS — E118 Type 2 diabetes mellitus with unspecified complications: Secondary | ICD-10-CM

## 2013-12-12 DIAGNOSIS — IMO0002 Reserved for concepts with insufficient information to code with codable children: Secondary | ICD-10-CM

## 2013-12-12 DIAGNOSIS — B3731 Acute candidiasis of vulva and vagina: Secondary | ICD-10-CM

## 2013-12-12 DIAGNOSIS — B373 Candidiasis of vulva and vagina: Secondary | ICD-10-CM

## 2013-12-12 DIAGNOSIS — I10 Essential (primary) hypertension: Secondary | ICD-10-CM

## 2013-12-12 DIAGNOSIS — E1122 Type 2 diabetes mellitus with diabetic chronic kidney disease: Secondary | ICD-10-CM | POA: Insufficient documentation

## 2013-12-12 DIAGNOSIS — E1165 Type 2 diabetes mellitus with hyperglycemia: Secondary | ICD-10-CM

## 2013-12-12 HISTORY — DX: Essential (primary) hypertension: I10

## 2013-12-12 MED ORDER — FLUCONAZOLE 150 MG PO TABS: 150.0000 mg | ORAL_TABLET | Freq: Once | ORAL | Status: DC

## 2013-12-12 MED ORDER — LISINOPRIL 5 MG PO TABS: 5.0000 mg | ORAL_TABLET | Freq: Every day | ORAL | Status: DC

## 2013-12-12 NOTE — Patient Instructions (Signed)
Your next visit for your diabetes should be at the beginning of February. Schedule nurse visit for 7-10 days to recheck your blood pressure once we start lisinopril. I will call you with the results to your lab work once they become available.

## 2013-12-12 NOTE — Assessment & Plan Note (Signed)
Diflucan given, discussed the likelihood of uncontrolled diabetes being the etiology behind her frequent yeast infections.

## 2013-12-12 NOTE — Assessment & Plan Note (Signed)
Patient started on 5 mg lisinopril today for renal protection, and mildly elevated blood pressure. She seems to be doing well on her Levemir, she has now received all of her supplies. She is to continue with current regimen writing down all of her blood sugars so that we may better guide her in her management. Follow-up into January began of February.

## 2013-12-12 NOTE — Progress Notes (Signed)
   Subjective:    Patient ID: Karina [name], female    DOB: 01/17/1985, 28 y.o.   MRN: [id number]  HPI  Diabetes: Patient presents to follow-up for her diabetes management. She has now received all of her supplies, including a new glucometer approximately 3 days ago. She states her first fasting blood glucose that she took was this morning, and it was 98. She had had some highs into the 300s yesterday evening. She is currently taking Levemir 45 units twice a day, and NovoLog 20 units before each meal. She feels like she has a yeast infection again today. She also feels like when her blood sugar was 98 she was a little shaky. Her blood pressure is mildly elevated again today. Nonsmoker.   Past Medical History  Diagnosis Date  . Diabetes mellitus     Type 2, insulin resistant   No Known Allergies   Review of Systems Per HPI    Objective:   Physical Exam BP 149/91 mmHg  Pulse 85  Temp(Src) 98.1 F (36.7 C) (Oral)  Ht 5\' 2"  (1.575 m)  Wt 235 lb 12.8 oz (106.958 kg)  BMI 43.12 kg/m2  LMP 12/06/2013 Gen: Very pleasant, African American female, no acute distress, nontoxic appearance, well-developed, well-nourished, obese HEENT: AT. [place].  Bilateral eyes without injections or icterus. MMM.  CV: RRR  Chest: CTAB, no wheeze or crackles Ext: No erythema. No edema. +2/4 PT Skin: No rashes, purpura or petechiae. Skin intact, no breakdown.         Assessment & Plan:

## 2013-12-13 ENCOUNTER — Encounter: Payer: Self-pay | Admitting: Family Medicine

## 2013-12-13 LAB — BASIC METABOLIC PANEL
BUN: 12 mg/dL (ref 6–23)
CALCIUM: 8.9 mg/dL (ref 8.4–10.5)
CO2: 26 mEq/L (ref 19–32)
Chloride: 104 mEq/L (ref 96–112)
Creat: 0.72 mg/dL (ref 0.50–1.10)
GLUCOSE: 122 mg/dL — AB (ref 70–99)
Potassium: 3.9 mEq/L (ref 3.5–5.3)
SODIUM: 137 meq/L (ref 135–145)

## 2013-12-19 ENCOUNTER — Telehealth: Payer: Self-pay | Admitting: Family Medicine

## 2013-12-19 ENCOUNTER — Ambulatory Visit (INDEPENDENT_AMBULATORY_CARE_PROVIDER_SITE_OTHER): Payer: Medicaid Other | Admitting: *Deleted

## 2013-12-19 VITALS — BP 138/88 | HR 80

## 2013-12-19 DIAGNOSIS — Z0130 Encounter for examination of blood pressure without abnormal findings: Secondary | ICD-10-CM

## 2013-12-19 DIAGNOSIS — Z136 Encounter for screening for cardiovascular disorders: Secondary | ICD-10-CM

## 2013-12-19 NOTE — Progress Notes (Signed)
   Pt in nurse clinic for blood pressure check.  BP 138/88 manually, heart rate 80.  Pt stated she had a headache last night,but think it was related to her diabetes.  She took some Ibuprofen and her insulin she was ok.  She has not taken her blood pressure medication today.  Pt denies any other symptoms.  Will forward to PCP. Clovis PuMartin, Sherie Dobrowolski L, RN

## 2013-12-19 NOTE — Telephone Encounter (Signed)
Please call pt, her BP is still not quite at goal. Considering she has not taking her med today I will not change anything. I will need to see her again in 1 month and we will discuss in more detail. This can be completed at the same time as her Diabetes visit. Thanks.

## 2013-12-26 ENCOUNTER — Ambulatory Visit: Payer: Medicaid Other

## 2013-12-28 ENCOUNTER — Encounter: Payer: Self-pay | Admitting: Family Medicine

## 2014-01-24 ENCOUNTER — Telehealth: Payer: Self-pay | Admitting: Family Medicine

## 2014-01-24 DIAGNOSIS — E118 Type 2 diabetes mellitus with unspecified complications: Secondary | ICD-10-CM

## 2014-01-24 NOTE — Telephone Encounter (Signed)
Pt called and needs a referral to have a eye exam. Please call patient when done. jw

## 2014-01-25 NOTE — Telephone Encounter (Signed)
Referral placed for opthalmology for diabetic eye exam. Thanks.

## 2014-01-31 ENCOUNTER — Other Ambulatory Visit: Payer: Self-pay | Admitting: Family Medicine

## 2014-01-31 DIAGNOSIS — E118 Type 2 diabetes mellitus with unspecified complications: Principal | ICD-10-CM

## 2014-01-31 DIAGNOSIS — IMO0002 Reserved for concepts with insufficient information to code with codable children: Secondary | ICD-10-CM

## 2014-01-31 DIAGNOSIS — E1165 Type 2 diabetes mellitus with hyperglycemia: Secondary | ICD-10-CM

## 2014-01-31 MED ORDER — ACCU-CHEK FASTCLIX LANCETS MISC: Status: DC

## 2014-01-31 NOTE — Telephone Encounter (Signed)
Pt called and needs a refill on her syringes called in. jw

## 2014-02-08 ENCOUNTER — Other Ambulatory Visit: Payer: Self-pay | Admitting: Family Medicine

## 2014-02-08 ENCOUNTER — Telehealth: Payer: Self-pay | Admitting: Family Medicine

## 2014-02-08 DIAGNOSIS — E119 Type 2 diabetes mellitus without complications: Secondary | ICD-10-CM

## 2014-02-08 MED ORDER — INSULIN PEN NEEDLE 31G X 8 MM MISC: Status: DC

## 2014-02-08 NOTE — Telephone Encounter (Signed)
Called in needles.  Thanks, Twana FirstBryan R. Paulina FusiHess, DO [facility] 02/08/2014, 1:38 PM

## 2014-02-08 NOTE — Telephone Encounter (Signed)
There is no syringes listed on medication list.  Clovis PuMartin, Tamika L, RN

## 2014-02-08 NOTE — Telephone Encounter (Signed)
Pt called and needs a refill on her Syringes called in. jw

## 2014-02-12 ENCOUNTER — Emergency Department (HOSPITAL_COMMUNITY)
Admission: EM | Admit: 2014-02-12 | Discharge: 2014-02-12 | Disposition: A | Discharge status: Home / Self Care | Payer: Medicaid Other | Attending: Emergency Medicine | Admitting: Emergency Medicine

## 2014-02-12 ENCOUNTER — Encounter (HOSPITAL_COMMUNITY): Payer: Self-pay | Admitting: Emergency Medicine

## 2014-02-12 DIAGNOSIS — Z794 Long term (current) use of insulin: Secondary | ICD-10-CM | POA: Insufficient documentation

## 2014-02-12 DIAGNOSIS — E119 Type 2 diabetes mellitus without complications: Secondary | ICD-10-CM | POA: Diagnosis not present

## 2014-02-12 DIAGNOSIS — Z792 Long term (current) use of antibiotics: Secondary | ICD-10-CM | POA: Insufficient documentation

## 2014-02-12 DIAGNOSIS — Z79899 Other long term (current) drug therapy: Secondary | ICD-10-CM | POA: Diagnosis not present

## 2014-02-12 DIAGNOSIS — L739 Follicular disorder, unspecified: Secondary | ICD-10-CM | POA: Diagnosis not present

## 2014-02-12 DIAGNOSIS — N644 Mastodynia: Secondary | ICD-10-CM | POA: Diagnosis present

## 2014-02-12 MED ORDER — SULFAMETHOXAZOLE-TRIMETHOPRIM 800-160 MG PO TABS: 1.0000 | ORAL_TABLET | Freq: Two times a day (BID) | ORAL | Status: DC

## 2014-02-12 MED ORDER — OXYCODONE-ACETAMINOPHEN 5-325 MG PO TABS
1.0000 | ORAL_TABLET | Freq: Once | ORAL | Status: AC
  Administered 2014-02-12: 1 via ORAL
  Filled 2014-02-12: qty 1

## 2014-02-12 MED ORDER — HYDROCODONE-ACETAMINOPHEN 5-325 MG PO TABS: 1.0000 | ORAL_TABLET | Freq: Four times a day (QID) | ORAL | Status: DC | PRN

## 2014-02-12 MED ORDER — SULFAMETHOXAZOLE-TRIMETHOPRIM 800-160 MG PO TABS
1.0000 | ORAL_TABLET | Freq: Once | ORAL | Status: AC
  Administered 2014-02-12: 1 via ORAL
  Filled 2014-02-12: qty 1

## 2014-02-12 NOTE — ED Provider Notes (Signed)
CSN: [id number]     Arrival date & time 02/12/14  0708 History   First MD Initiated Contact with Patient 02/12/14 [phone]     Chief Complaint  Patient presents with  . Breast Pain     HPI Patient presents to the emergency department with discomfort and pain in her right axilla over the past 2 days.  She states the pain radiates towards her right lateral breast.  She's had no fever or chills.  No prior history of abscess.  She states there is discomfort and pain when she raises her right arm up above her head.  No weight changes.  No history of breast cancer.  No masses or abnormalities noted by the patient of her right breast besides mild tenderness on the lateral aspect of her right breast.   Past Medical History  Diagnosis Date  . Diabetes mellitus     Type 2, insulin resistant   Past Surgical History  Procedure Laterality Date  . Cervical biopsy  2004  . Cesarean section N/A 06/02/2012    Procedure: CESAREAN SECTION;  Surgeon: Mora Bellman, MD;  Location: [facility];  Service: Obstetrics;  Laterality: N/A;   Family History  Problem Relation Age of Onset  . Diabetes Father    History  Substance Use Topics  . Smoking status: Never Smoker   . Smokeless tobacco: Never Used  . Alcohol Use: No   OB History    Gravida Para Term Preterm AB TAB SAB Ectopic Multiple Living   '1 1 1       1     '$ Review of Systems  All other systems reviewed and are negative.     Allergies  Review of patient's allergies indicates no known allergies.  Home Medications   Prior to Admission medications   Medication Sig Start Date End Date Taking? Authorizing Provider  insulin aspart (NOVOLOG) 100 UNIT/ML injection Inject 2-17 Units into the skin 3 (three) times daily before meals. Patient taking differently: Inject 2-17 Units into the skin 3 (three) times daily before meals. Sliding scale 11/16/13  Yes Renee A Kuneff, DO  insulin detemir (LEVEMIR) 100 UNIT/ML injection Inject 0.35-0.45 mLs (35-45 Units  total) into the skin 2 (two) times daily. Patient taking differently: Inject 45 Units into the skin 2 (two) times daily.  11/16/13  Yes Renee A Kuneff, DO  ACCU-CHEK FASTCLIX LANCETS MISC Check sugar 5 x daily 01/31/14   Renee A Kuneff, DO  Blood Glucose Monitoring Suppl (ACCU-CHEK NANO SMARTVIEW) W/DEVICE KIT Check fasting blood glucose daily, then prior to every meal and bedtime. (x5 a day) 11/16/13   Renee A Kuneff, DO  Dextromethorphan-Guaifenesin (MUCINEX DM PO) Take 20 mLs by mouth 3 (three) times daily as needed (for cough/congestion).    Historical Provider, MD  fluconazole (DIFLUCAN) 150 MG tablet Take 1 tablet (150 mg total) by mouth once. Patient not taking: Reported on 02/12/2014 12/12/13   Renee A Kuneff, DO  glucose blood (ACCU-CHEK SMARTVIEW) test strip Check sugar 5 x daily 11/16/13   Renee A Kuneff, DO  HYDROcodone-acetaminophen (NORCO/VICODIN) 5-325 MG per tablet Take 1 tablet by mouth every 6 (six) hours as needed for moderate pain. 02/12/14   Hoy Morn, MD  HYDROcodone-homatropine [facility]) 5-1.5 MG/5ML syrup Take 5 mLs by mouth every 6 (six) hours as needed for cough. Patient not taking: Reported on 02/12/2014 08/12/13   Margarita Mail, PA-C  ibuprofen (ADVIL,MOTRIN) 200 MG tablet Take 600 mg by mouth every 6 (six) hours as needed for  moderate pain.    Historical Provider, MD  Insulin Pen Needle 31G X 8 MM MISC Check sugars two times per day 02/08/14   Nolon Rod, DO  lisinopril (PRINIVIL,ZESTRIL) 5 MG tablet Take 1 tablet (5 mg total) by mouth daily. Patient not taking: Reported on 02/12/2014 12/12/13   Renee A Kuneff, DO  sulfamethoxazole-trimethoprim (SEPTRA DS) 800-160 MG per tablet Take 1 tablet by mouth every 12 (twelve) hours. 02/12/14   Hoy Morn, MD   BP 134/76 mmHg  Pulse 88  Temp(Src) 98.3 F (36.8 C) (Oral)  Resp 17  Ht $R'5\' 2"'rJ$  (1.575 m)  Wt 240 lb (108.863 kg)  BMI 43.89 kg/m2  SpO2 99%  LMP 02/05/2014 Physical Exam  Constitutional: She is oriented to person,  place, and time. She appears well-developed and well-nourished.  HENT:  Head: Normocephalic.  Eyes: EOM are normal.  Neck: Normal range of motion.  Pulmonary/Chest: Effort normal.  Normal appearance of right breast.  No nipple discharge.  No overlying skin changes of the right breast.  No focal tenderness of the right breast and no masses palpated.  Chaperone present.  Abdominal: She exhibits no distension.  Musculoskeletal: Normal range of motion.  Small folliculitis in her right axilla without frank induration.  No drainage.  No surrounding cellulitis.  Neurological: She is alert and oriented to person, place, and time.  Psychiatric: She has a normal mood and affect.  Nursing note and vitals reviewed.   ED Course  Procedures (including critical care time) Labs Review Labs Reviewed - No data to display  Imaging Review No results found.   EKG Interpretation None      MDM   Final diagnoses:  Folliculitis    Conservative measures with antibiotics and warm compresses will be tried.  A vas that the patient return to the emergency department in 48 hours for repeat evaluation.  A vas that she return sooner as needed for worsening symptoms.  At this time I do not believe it needs incision and drainage.  We will reevaluate in 58 hours    Hoy Morn, MD 02/12/14 [phone]

## 2014-02-12 NOTE — ED Notes (Signed)
Pt getting dressed.

## 2014-02-12 NOTE — ED Notes (Signed)
Pt states she has never had a lump or pain like this before. Pt denies doing anything that she believes she could have injured herself.

## 2014-02-12 NOTE — ED Notes (Signed)
Patient has a palpable knot to the right axillary area with pain radiating to the right breast.

## 2014-02-12 NOTE — ED Notes (Signed)
Dr. Campos at bedside   

## 2014-02-12 NOTE — ED Notes (Signed)
Pt c/o right breast pain and knot in right axillary x 2 days. Pt denies any SOB or other sx.

## 2014-02-12 NOTE — Discharge Instructions (Signed)

## 2014-02-12 NOTE — ED Notes (Signed)
Raised area noted under right arm pit, area is tender to touch.

## 2014-02-13 ENCOUNTER — Telehealth: Payer: Self-pay | Admitting: Family Medicine

## 2014-02-13 NOTE — Telephone Encounter (Signed)
Needs syringes for insulin. [facility] on [address]

## 2014-02-14 NOTE — Telephone Encounter (Signed)
Please call pt or pharmacy, we will need to know what type of syringe she needs for her insulin and then we will call it in. Thanks.

## 2014-02-14 NOTE — Telephone Encounter (Signed)
Pt stated she need Relion Insulin Syringes 31 G, 8 mm Short.  Clovis PuMartin, Seve Monette L, RN

## 2014-02-15 ENCOUNTER — Telehealth: Payer: Self-pay | Admitting: Family Medicine

## 2014-02-15 ENCOUNTER — Ambulatory Visit: Payer: Medicaid Other | Admitting: Family Medicine

## 2014-02-15 NOTE — Telephone Encounter (Signed)
I called the pharmacy today, to call in her prescription for her insulin syringes, and they stated that the prescription was already called in and picked up 2 days ago. Thanks

## 2014-03-19 ENCOUNTER — Ambulatory Visit: Payer: Medicaid Other | Admitting: Family Medicine

## 2014-04-09 ENCOUNTER — Encounter: Payer: Self-pay | Admitting: Family Medicine

## 2014-04-09 ENCOUNTER — Ambulatory Visit (INDEPENDENT_AMBULATORY_CARE_PROVIDER_SITE_OTHER): Payer: Medicaid Other | Admitting: Family Medicine

## 2014-04-09 VITALS — BP 118/72 | HR 92 | Temp 98.1°F | Ht 62.0 in | Wt 236.5 lb

## 2014-04-09 DIAGNOSIS — E118 Type 2 diabetes mellitus with unspecified complications: Secondary | ICD-10-CM

## 2014-04-09 DIAGNOSIS — IMO0002 Reserved for concepts with insufficient information to code with codable children: Secondary | ICD-10-CM

## 2014-04-09 DIAGNOSIS — E1165 Type 2 diabetes mellitus with hyperglycemia: Secondary | ICD-10-CM

## 2014-04-09 LAB — POCT GLYCOSYLATED HEMOGLOBIN (HGB A1C): Hemoglobin A1C: 12.8

## 2014-04-09 MED ORDER — INSULIN DETEMIR 100 UNIT/ML ~~LOC~~ SOLN: 45.0000 [IU] | Freq: Two times a day (BID) | SUBCUTANEOUS | Status: DC

## 2014-04-09 MED ORDER — INSULIN ASPART 100 UNIT/ML ~~LOC~~ SOLN: 20.0000 [IU] | Freq: Three times a day (TID) | SUBCUTANEOUS | Status: DC

## 2014-04-09 NOTE — Progress Notes (Signed)
   Subjective:    Patient ID: Karina [name], female    DOB: 1985-10-19, 29 y.o.   MRN: [id number]  HPI  Diabetes: Patient presents for diabetes follow-up. She has poorly controlled diabetes last A1c was above 14. She denies any hyper or hypoglycemic events. She denies any nonhealing wounds on her hands or feet. She denies any increase in her neuropathy symptoms. She is taking Levemir twice a day 45 units. She states that she either was sometime within the last 3 months that she been without her Levemir secondary to insurance problems. She continues to take NovoLog 20 units at breakfast 22 units of Lantus 20 units at dinner. She states her blood sugars are rarely in the 100s, and they're usually in the low 200s to 250 range, this includes her fasting blood glucose. She states there was 1 she had a blood glucose that was 80. She admittedly is not watching her diet closely, and she is not exercising as of yet. Patient is seeing a podiatrist. 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  Obesity: Patient is interested in the possibility of having bariatric surgery in the future. Right now she is interested in obtaining information, and possibly having a referral placed. She has not yet tried an exercise program or a weight loss program. She has uncontrolled diabetes, with last A1c above 14.   Nonsmoker Past Medical History  Diagnosis Date  . Diabetes mellitus     Type 2, insulin resistant   No Known Allergies  Review of Systems Per history of present illness    Objective:   Physical Exam BP 118/72 mmHg  Pulse 92  Temp(Src) 98.1 F (36.7 C) (Oral)  Ht 5\' 2"  (1.575 m)  Wt 236 lb 8 oz (107.276 kg)  BMI 43.25 kg/m2  LMP 03/10/2014 Gen: NAD. Nontoxic in appearance, African-American female, very pleasant, morbidly obese. HEENT: AT. . MMM. CV: RRR  Chest: CTAB, no wheeze or crackles Ext: No  erythema. No edema. +2/4 PT. Good cap refill. Mildly elongated toenails. Callus formation second and third metatarsal head bilaterally.     Assessment & Plan:

## 2014-04-09 NOTE — Assessment & Plan Note (Signed)
Patient's A1c is 12.8 today. This is still obviously elevated from desired. She has been out of Levemir on a few occasions during the last few months. She is now restarted on the medication is taking approximately 45 units twice a day. She also continues to take NovoLog 20 units at breakfast, 22 units at lunch, 20 units at dinner. - Patient encouraged to keep her NovoLog dosage the same. Increase her Levemir dose to 47 units twice a day, and increase this one unit each day if her blood glucose is above 110 fasting. Patient is in understanding of these instructions. - She is encouraged to exercise 150 minutes a week - She is encouraged to continue to watch her diet more closely, carb counting AVS given to her today. - Diabetic nutrition consult placed today, patient was encouraged to call Dr. Gerilyn PilgrimSykes to make an appointment as soon as possible. Contact information was given. - Follow-up in 3 months

## 2014-04-09 NOTE — Assessment & Plan Note (Signed)
Patient expressed interest in bariatric surgery. Discussed with patient she would likely need to get her sugars under better control, attempt to go on a diet, and exercise program before she would be considered for bariatric surgery. Body mass index is 43.25 kg/(m^2).  AVS on bariatric surgery was given to patient today. Patient will return for her well woman visit within the next few weeks, in we will discuss possible bariatric surgery in more detail at that time.

## 2014-04-09 NOTE — Patient Instructions (Signed)
Basic Carbohydrate Counting for Diabetes Mellitus Carbohydrate counting is a method for keeping track of the amount of carbohydrates you eat. Eating carbohydrates naturally increases the level of sugar (glucose) in your blood, so it is important for you to know the amount that is okay for you to have in every meal. Carbohydrate counting helps keep the level of glucose in your blood within normal limits. The amount of carbohydrates allowed is different for every person. A dietitian can help you calculate the amount that is right for you. Once you know the amount of carbohydrates you can have, you can count the carbohydrates in the foods you want to eat. Carbohydrates are found in the following foods:  Grains, such as breads and cereals.  Dried beans and soy products.  Starchy vegetables, such as potatoes, peas, and corn.  Fruit and fruit juices.  Milk and yogurt.  Sweets and snack foods, such as cake, cookies, candy, chips, soft drinks, and fruit drinks. CARBOHYDRATE COUNTING There are two ways to count the carbohydrates in your food. You can use either of the methods or a combination of both. Reading the "Nutrition Facts" on Packaged Food The "Nutrition Facts" is an area that is included on the labels of almost all packaged food and beverages in the [place]. It includes the serving size of that food or beverage and information about the nutrients in each serving of the food, including the grams (g) of carbohydrate per serving.  Decide the number of servings of this food or beverage that you will be able to eat or drink. Multiply that number of servings by the number of grams of carbohydrate that is listed on the label for that serving. The total will be the amount of carbohydrates you will be having when you eat or drink this food or beverage. Learning Standard Serving Sizes of Food When you eat food that is not packaged or does not include "Nutrition Facts" on the label, you need to  measure the servings in order to count the amount of carbohydrates.A serving of most carbohydrate-rich foods contains about 15 g of carbohydrates. The following list includes serving sizes of carbohydrate-rich foods that provide 15 g ofcarbohydrate per serving:   1 slice of bread (1 oz) or 1 six-inch tortilla.    of a hamburger bun or English muffin.  4-6 crackers.   cup unsweetened dry cereal.    cup hot cereal.   cup rice or pasta.    cup mashed potatoes or  of a large baked potato.  1 cup fresh fruit or one small piece of fruit.    cup canned or frozen fruit or fruit juice.  1 cup milk.   cup plain fat-free yogurt or yogurt sweetened with artificial sweeteners.   cup cooked dried beans or starchy vegetable, such as peas, corn, or potatoes.  Decide the number of standard-size servings that you will eat. Multiply that number of servings by 15 (the grams of carbohydrates in that serving). For example, if you eat 2 cups of strawberries, you will have eaten 2 servings and 30 g of carbohydrates (2 servings x 15 g = 30 g). For foods such as soups and casseroles, in which more than one food is mixed in, you will need to count the carbohydrates in each food that is included. EXAMPLE OF CARBOHYDRATE COUNTING Sample Dinner  3 oz chicken breast.   cup of brown rice.   cup of corn.  1 cup milk.   1 cup strawberries with   sugar-free whipped topping.  Carbohydrate Calculation Step 1: Identify the foods that contain carbohydrates:   Rice.   Corn.   Milk.   Strawberries. Step 2:Calculate the number of servings eaten of each:   2 servings of rice.   1 serving of corn.   1 serving of milk.   1 serving of strawberries. Step 3: Multiply each of those number of servings by 15 g:   2 servings of rice x 15 g = 30 g.   1 serving of corn x 15 g = 15 g.   1 serving of milk x 15 g = 15 g.   1 serving of strawberries x 15 g = 15 g. Step 4: Add  together all of the amounts to find the total grams of carbohydrates eaten: 30 g + 15 g + 15 g + 15 g = 75 g. Document Released: 12/28/2004 Document Revised: 05/14/2013 Document Reviewed: 11/24/2012 [facility] Patient Information 2015 [place], [place]. This information is not intended to replace advice given to you by your health care provider. Make sure you discuss any questions you have with your health care provider.  Diabetes and Exercise Exercising regularly is important. It is not just about losing weight. It has many health benefits, such as:  Improving your overall fitness, flexibility, and endurance.  Increasing your bone density.  Helping with weight control.  Decreasing your body fat.  Increasing your muscle strength.  Reducing stress and tension.  Improving your overall health. People with diabetes who exercise gain additional benefits because exercise:  Reduces appetite.  Improves the body's use of blood sugar (glucose).  Helps lower or control blood glucose.  Decreases blood pressure.  Helps control blood lipids (such as cholesterol and triglycerides).  Improves the body's use of the hormone insulin by:  Increasing the body's insulin sensitivity.  Reducing the body's insulin needs.  Decreases the risk for heart disease because exercising:  Lowers cholesterol and triglycerides levels.  Increases the levels of good cholesterol (such as high-density lipoproteins [HDL]) in the body.  Lowers blood glucose levels. YOUR ACTIVITY PLAN  Choose an activity that you enjoy and set realistic goals. Your health care provider or diabetes educator can help you make an activity plan that works for you. Exercise regularly as directed by your health care provider. This includes:  Performing resistance training twice a week such as push-ups, sit-ups, lifting weights, or using resistance bands.  Performing 150 minutes of cardio exercises each week such as walking, running, or  playing sports.  Staying active and spending no more than 90 minutes at one time being inactive. Even short bursts of exercise are good for you. Three 10-minute sessions spread throughout the day are just as beneficial as a single 30-minute session. Some exercise ideas include:  Taking the dog for a walk.  Taking the stairs instead of the elevator.  Dancing to your favorite song.  Doing an exercise video.  Doing your favorite exercise with a friend. RECOMMENDATIONS FOR EXERCISING WITH TYPE 1 OR TYPE 2 DIABETES   Check your blood glucose before exercising. If blood glucose levels are greater than 240 mg/dL, check for urine ketones. Do not exercise if ketones are present.  Avoid injecting insulin into areas of the body that are going to be exercised. For example, avoid injecting insulin into:  The arms when playing tennis.  The legs when jogging.  Keep a record of:  Food intake before and after you exercise.  Expected peak times of insulin action.  Blood glucose levels before and after you exercise.  The type and amount of exercise you have done.  Review your records with your health care provider. Your health care provider will help you to develop guidelines for adjusting food intake and insulin amounts before and after exercising.  If you take insulin or oral hypoglycemic agents, watch for signs and symptoms of hypoglycemia. They include:  Dizziness.  Shaking.  Sweating.  Chills.  Confusion.  Drink plenty of water while you exercise to prevent dehydration or heat stroke. Body water is lost during exercise and must be replaced.  Talk to your health care provider before starting an exercise program to make sure it is safe for you. Remember, almost any type of activity is better than none. Document Released: 03/20/2003 Document Revised: 05/14/2013 Document Reviewed: 06/06/2012 [facility] Patient Information 2015 [place], [place]. This information is not intended to  replace advice given to you by your health care provider. Make sure you discuss any questions you have with your health care provider.   Start your Levemir dose this evening at 47 units twice a day. Increase this by one unit twice a day every day if you're fasting blood glucose is about 110 in the morning. Continue to take the NovoLog 20 units at breakfast, 22 units at lunch, 20 units at dinner. Continue to monitor your blood sugars fasting in the morning, before each meal, and before bedtime. Please write these down in a book and bring them with you on your next appointment. Your A1c today is 12.8, this is still extremely high, but is an improvement from your last time. If you work on exercising 150 minutes a week and watch her diet more closely by counting her carbohydrates, I'm sure this will continue to improve. Schedule appointment to have your Pap smear completed as soon as possible.   Bariatric Surgery Information Severe obesity is difficult to treat through diet and exercise alone. Bariatric surgery (also called weight loss surgery) is an option for people who are severely obese and cannot lose weight by traditional means, or who suffer from serious obesity-related health problems. The surgery promotes weight loss by decreasing the absorption of food and, in some cases, by interrupting the digestive process. As in other treatments for obesity, best results are achieved with healthy eating behaviors and regular physical activity.  People who may consider bariatric surgery include those with a body mass index (BMI) above 40. Men with a BMI of 40 are about 100 lb (45 kg) overweight and women with this BMI are about 80 lb (36 kg) overweight. People with a BMI between 35 and 40 and who suffer from type 2 diabetes or life-threatening heart and lung (cardiopulmonary) problems, such as severe sleep apnea or obesity-related heart disease, may also be candidates for surgery.  THE NORMAL DIGESTIVE  PROCESS Normally, as food moves along the digestive tract, digestive juices and enzymes digest and absorb calories and nutrients. After we chew and swallow our food, it moves down the esophagus to the stomach. There a strong acid continues the digestive process. When the stomach contents move to the first portion of the small intestine (duodenum), bile and pancreatic juice speed up digestion. The jejunum and ileum are the remaining two segments of the small intestine. They complete the absorption of almost all calories and nutrients. The food particles that cannot be digested in the small intestine are stored in the large intestine until they are eliminated.  HOW DOES SURGERY PROMOTE WEIGHT LOSS? Bariatric surgery  alters the digestive process. The surgery closes off parts of the stomach to make it smaller, restricting the amount of food the stomach can hold. There are two types of bariatric surgeries: restrictive surgeries and malabsorptive surgeries. Restrictive surgeries only reduce stomach size. They do not interfere with the normal digestive process. Malabsorptive surgeries combine stomach restriction with a partial bypass of the small intestine. These types of procedures create a direct connection from the stomach to the lower segment of the small intestine. The connection causes food to bypass the portions of the digestive tract that absorb calories and nutrients.Malabsorptive surgeries are the most common surgeries for weight loss. They restrict both food intake and the amount of calories and nutrients the body absorbs.  Restrictive surgeries lead to weight loss in almost all patients. But they are less successful than malabsorptive surgeries in achieving substantial, long-term weight loss. Some patients regain weight. Others are unable to adjust their eating habits and fail to lose the desired weight. Successful results depend on the patient's willingness to adopt a long-term plan of healthy eating and  regular physical activity.  RESTRICTIVE SURGERY To perform a restrictive surgery, health care providers create a small pouch at the top of the stomach where food enters from the esophagus. At first, the pouch holds about 1 oz (28 g) of food. It later expands to hold 2-3 oz (56-84 g). The lower outlet of the pouch usually has a diameter of about  inch (1.9 cm). This small outlet delays the emptying of food from the pouch and causes a feeling of fullness. As a result of this surgery, most people lose the ability to eat large amounts of food at one time. After the surgery, the person usually can eat only  to 1 c (about 2 L) of food without discomfort or nausea. Also, food has to be well chewed.  There are several types of procedures that create this pouch. Adjustable Gastric Banding In this procedure, a hollow band is placed around the stomach near its upper end. This creates a small pouch and a narrow passage into the larger remainder of the stomach. The band is then inflated with a salt solution. It can be tightened or loosened over time to change the size of the passage by increasing or decreasing the amount of salt solution.  The band is adjusted based on feelings of hunger and weight loss. Patients decide when they need an adjustment and come to their surgeons to be evaluated. The adjustment is done as an office visit. The band is fully reversible with a second surgery if the patient changes his or her mind. There is no cutting or rerouting of the intestine.  Vertical Banded Gastroplasty This is the most common restrictive surgery for weight control. Both a band and staples are used to create a small stomach pouch. Vertical banded gastroplasty is based on the same principle of restriction as adjustable gastric banding, but the stomach is surgically altered with the stapling. This treatment is not reversible.  About 30% of those who undergo vertical banded gastroplasty achieve normal weight. About 80%  achieve some degree of weight loss. MALABSORPTIVE SURGERY Malabsorptive surgeries produce more weight loss than restrictive surgeries. And they are more effective in reversing the health problems associated with severe obesity. Patients who have malabsorptive surgeries generally lose two-thirds of their excess weight within 2 years. There are several types of malabsorptive surgeries. Each one carries its own benefits and risks.  Roux-en-Y Gastric Bypass (RGB) This surgery is  the most common and successful malabsorptive surgery. First, a small stomach pouch is created to restrict food intake. Next, a y-shaped section of the small intestine is attached to the pouch. This allows food to bypass the lower stomach, the duodenum, and the first portion of jejunum. This reduces the amount of calories and nutrients the body absorbs.  Biliopancreatic diversion (BPD) In this more complicated malabsorptive surgery, portions of the stomach are removed. The small pouch that remains is connected directly to the final segment of the small intestine, completely bypassing the duodenum and the jejunum.  This procedure successfully promotes weight loss. But it is less frequently used than other types of surgery because of the high risk for nutritional deficiencies. A variation of this procedure includes a "duodenal switch." This leaves a larger portion of the stomach intact, including the valve that regulates the release of stomach contents into the small intestine (pyloric valve). It also keeps a small part of the duodenum in the digestive pathway.  WHAT ARE THE BENEFITS AND RISKS OF BARIATRIC SURGERY? General Benefits  Right after surgery, most patients lose weight quickly. They continue to lose weight for 18-24 months after the procedure. Most patients regain 5-10% of the weight they lost, but many maintain a long-term weight loss of about 100 lb (45 kg).   Most obesity-related conditions, such as abnormal blood sugar  levels, improve after the surgery.  General Risks  Infection.  Abdominal hernias.   Breakdown of the staple line.   Stretched stomach outlets.  Development of gallstones. These are clumps of cholesterol and other matter that form in the gallbladder. During quick or substantial weight loss, one's risk of developing gallstones increases.   Nutritional deficiencies. Nearly 30% of patients who have bariatric surgery develop nutritional deficiencies. These include anemia, osteoporosis, and metabolic bone disease.  A leak from any of the surgical connections (anastomoses). This is life-threatening. The more involved the surgery, the more risk involved.   Inability to lose weight or weight gain. Patients who do not follow a strict diet will stretch out their stomach pouches and they will not lose weight.   Dumping syndrome. This occurs when stomach contents move too rapidly through the small intestine causing cramping, diarrhea, nausea, palpitations, sweating, bloating, and dizziness or fainting.  Specific Risks of Restrictive Surgeries  Vomiting. This occurs when the small stomach is overly stretched by food particles that have not been chewed well.   Band slippage and saline leakage. This may occur after adjustable gastric banding.   Band erosion into the lumen of the stomach.   Wearing away of the band and breakdown of the staple line. This may occur after vertical banded gastroplasty. In a small number of cases, stomach juices may leak into the abdomen. If this happens, an emergency surgery is needed.   Death from complications (rare). This happens in only about 1% of cases.   Stomach prolapse. Specific Risks of Malabsorptive Surgeries In addition to the risks of restrictive surgeries, malabsorptive surgeries also carry greater risk for nutritional deficiencies. This is because the procedure causes food to bypass the duodenum and jejunum. That is where most iron and  calcium are absorbed. The more extensive the bypass, the greater the risk is for complications and nutritional deficiencies, including:    Anemia.  Osteoporosis.   Metabolic bone disease. Follow-up surgeries to correct complications are needed in about 10-20% of patients.  FOR MORE INFORMATION American Society for Metabolic & Bariatric Surgery: www.asmbs.org  Weight-control Information Network (WIN):  https://www.choi-stevens.org/ Document Released: 12/28/2004 Document Revised: 01/02/2013 Document Reviewed: 06/27/2012 [facility] Patient Information 2015 [place], [place]. This information is not intended to replace advice given to you by your health care provider. Make sure you discuss any questions you have with your health care provider.

## 2014-04-10 NOTE — Progress Notes (Signed)
I was preceptor for this office visit.  

## 2014-06-11 ENCOUNTER — Encounter: Payer: Medicaid Other | Admitting: Family Medicine

## 2014-07-04 ENCOUNTER — Ambulatory Visit (INDEPENDENT_AMBULATORY_CARE_PROVIDER_SITE_OTHER): Payer: Medicaid Other | Admitting: Family Medicine

## 2014-07-04 ENCOUNTER — Other Ambulatory Visit (HOSPITAL_COMMUNITY)
Admission: RE | Admit: 2014-07-04 | Discharge: 2014-07-04 | Disposition: A | Discharge status: Home / Self Care | Payer: Medicaid Other | Source: Ambulatory Visit | Attending: Family Medicine | Admitting: Family Medicine

## 2014-07-04 ENCOUNTER — Encounter: Payer: Self-pay | Admitting: Family Medicine

## 2014-07-04 VITALS — BP 139/79 | HR 96 | Temp 98.2°F | Resp 18 | Ht 62.99 in | Wt 237.0 lb

## 2014-07-04 DIAGNOSIS — IMO0002 Reserved for concepts with insufficient information to code with codable children: Secondary | ICD-10-CM

## 2014-07-04 DIAGNOSIS — B3731 Acute candidiasis of vulva and vagina: Secondary | ICD-10-CM

## 2014-07-04 DIAGNOSIS — N898 Other specified noninflammatory disorders of vagina: Secondary | ICD-10-CM

## 2014-07-04 DIAGNOSIS — Z01419 Encounter for gynecological examination (general) (routine) without abnormal findings: Secondary | ICD-10-CM | POA: Diagnosis not present

## 2014-07-04 DIAGNOSIS — E1140 Type 2 diabetes mellitus with diabetic neuropathy, unspecified: Secondary | ICD-10-CM | POA: Diagnosis not present

## 2014-07-04 DIAGNOSIS — I10 Essential (primary) hypertension: Secondary | ICD-10-CM

## 2014-07-04 DIAGNOSIS — B373 Candidiasis of vulva and vagina: Secondary | ICD-10-CM | POA: Diagnosis not present

## 2014-07-04 DIAGNOSIS — E118 Type 2 diabetes mellitus with unspecified complications: Secondary | ICD-10-CM | POA: Diagnosis not present

## 2014-07-04 DIAGNOSIS — N926 Irregular menstruation, unspecified: Secondary | ICD-10-CM | POA: Insufficient documentation

## 2014-07-04 DIAGNOSIS — Z124 Encounter for screening for malignant neoplasm of cervix: Secondary | ICD-10-CM

## 2014-07-04 DIAGNOSIS — E1165 Type 2 diabetes mellitus with hyperglycemia: Secondary | ICD-10-CM

## 2014-07-04 LAB — CBC WITH DIFFERENTIAL/PLATELET
BASOS PCT: 0 % (ref 0–1)
Basophils Absolute: 0 10*3/uL (ref 0.0–0.1)
Eosinophils Absolute: 0 10*3/uL (ref 0.0–0.7)
Eosinophils Relative: 1 % (ref 0–5)
HCT: 41.2 % (ref 36.0–46.0)
Hemoglobin: 13.8 g/dL (ref 12.0–15.0)
LYMPHS ABS: 1.9 10*3/uL (ref 0.7–4.0)
Lymphocytes Relative: 41 % (ref 12–46)
MCH: 27.6 pg (ref 26.0–34.0)
MCHC: 33.5 g/dL (ref 30.0–36.0)
MCV: 82.4 fL (ref 78.0–100.0)
MPV: 9.4 fL (ref 8.6–12.4)
Monocytes Absolute: 0.2 10*3/uL (ref 0.1–1.0)
Monocytes Relative: 5 % (ref 3–12)
Neutro Abs: 2.4 10*3/uL (ref 1.7–7.7)
Neutrophils Relative %: 53 % (ref 43–77)
Platelets: 363 10*3/uL (ref 150–400)
RBC: 5 MIL/uL (ref 3.87–5.11)
RDW: 13.9 % (ref 11.5–15.5)
WBC: 4.6 10*3/uL (ref 4.0–10.5)

## 2014-07-04 LAB — POCT GLYCOSYLATED HEMOGLOBIN (HGB A1C): HEMOGLOBIN A1C: 13.7

## 2014-07-04 LAB — POCT WET PREP (WET MOUNT): CLUE CELLS WET PREP WHIFF POC: NEGATIVE

## 2014-07-04 LAB — TSH: TSH: 1.57 u[IU]/mL (ref 0.350–4.500)

## 2014-07-04 MED ORDER — NYSTATIN 100000 UNIT/GM EX CREA: 1.0000 "application " | TOPICAL_CREAM | Freq: Two times a day (BID) | CUTANEOUS | Status: DC

## 2014-07-04 MED ORDER — FLUCONAZOLE 150 MG PO TABS: 150.0000 mg | ORAL_TABLET | Freq: Once | ORAL | Status: DC

## 2014-07-04 MED ORDER — INSULIN DETEMIR 100 UNIT/ML ~~LOC~~ SOLN: 55.0000 [IU] | Freq: Two times a day (BID) | SUBCUTANEOUS | Status: DC

## 2014-07-04 MED ORDER — LISINOPRIL 5 MG PO TABS: 5.0000 mg | ORAL_TABLET | Freq: Every day | ORAL | Status: DC

## 2014-07-04 NOTE — Progress Notes (Signed)
I was preceptor the day of this visit.   

## 2014-07-04 NOTE — Assessment & Plan Note (Signed)
Karina [name] is  29 y.o. presents to family medicine clinic for annual visit and diabetes follow-up. Irregular menses: Patient described elongated cycles, with occasional heavy menses. CBC, TSH collected today. Will notify patient once the results become available and make appropriate changes if necessary.

## 2014-07-04 NOTE — Progress Notes (Signed)
Subjective:    Patient ID: Karina [name], female    DOB: 1985/09/22, 29 y.o.   MRN: [id number]  HPI  Patient presents to family medicine clinic for annual physical exam and cervical cancer screening.  Diabetes: Patient reports compliance with her medications. She states she takes Levemir 50 units twice a day. She also takes NovoLog 17-20 units 3 times a day with meals. She denies any hyper or hypoglycemic events, she denies any neuropathy/numbness tingling of her extremities, she denies any visual changes. She denies any open or nonhealing wounds. She reports her fasting blood glucose is either low 200s or high 100s.  Well woman: Patient is due for her well woman exam today. She has had a history of ASCUS positive/HPV negative on her Pap smear one year ago. She reports a abnormal Pap one other time around 2004. He states she had a colposcopy at that time, and then her Paps were normal. Patient reports her menstrual cycle occurs every 37 to 45th day. Her cycle lasts for approximately 5-6 days with both light and heavy bleeding. She states she does see clots during her menstrual period, around the third to fourth day. She reports she usually goes through approximately 6 pads a day. She states that her mother had a history of fibroids, and she is concerned she may as well. She denies any headaches, chest pain, dizziness or shortness of breath.  Never smoker Past Medical History  Diagnosis Date  . Diabetes mellitus     Type 2, insulin resistant   No Known Allergies Past Surgical History  Procedure Laterality Date  . Cervical biopsy  2004  . Cesarean section N/A 06/02/2012    Procedure: CESAREAN SECTION;  Surgeon: Catalina Antigua, MD;  Location: [facility];  Service: Obstetrics;  Laterality: N/A;    Review of Systems Per history of present illness    Objective:   Physical Exam BP 139/79 mmHg  Pulse 96  Temp(Src) 98.2 F (36.8 C) (Oral)  Resp 18  Ht 5' 2.99" (1.6 m)  Wt 237 lb (107.502  kg)  BMI 41.99 kg/m2  SpO2 98%  LMP 06/27/2014 Gen: NAD. Nontoxic in appearance, well-developed, well-nourished, obese African-American female. Very pleasant, cooperative with exam. HEENT: AT. [place]. Bilateral TM visualized and normal in appearance. Bilateral eyes without injections or icterus. MMM. Bilateral nares pale, mild swelling. Throat without erythema or exudates.  CV: RRR  Chest: CTAB, no wheeze or crackles Abd: Soft. Obese. NTND. BS present. No Masses palpated.  Ext: No erythema. No edema. +2/4 PT. Bilateral feet skin intact, warm and well-perfused. Right large toenail is growing into place. Sensation intact. Neuro vascularly intact distally. Skin: No rashes, purpura or petechiae.   GYN:  External genitalia within normal limits.  Vaginal mucosa pink, moist, normal rugae.  Mildly friable cervix without lesions, moderate white thick discharge on speculum exam.   No cervical motion tenderness. No adnexal masses bilaterally.      Assessment & Plan:  Karina [name] is  29 y.o. presents to family medicine clinic for annual visit and diabetes follow-up.  - Diabetes: Patient's A1c today is 13.7. Uncertain if she is truly taking her medications every day. She is on Levemir 50 units daily as well as NovoLog 17-20 units 3 times a day. She was encouraged to increase  Levemir to 55 units twice a day. Foot exam completed today and was normal. Patient's had recent ophthalmology appointment with moderate proliferative diabetic retinopathy she is due for a repeat visit as  it has been a year, and it was advised her to follow-up in 6 months. Patient has been notified to make an appointment with her ophthalmologist. I have also asked her to make an appointment with Dr. Raymondo Band to follow-up on her insulin regimen, and attempt to get better control for her. Patient states she is watching her weight, but not exercising regulate. Encouraged her to exercise 150 minutes a week. Follow a diabetic diet.  - Well woman  exam: Wet prep and Pap collected today. Patient had signs of yeast infection of the skin and vagina today. Wet prep with positive yeast. Nystatin cream was called in for external use, and Diflucan 1. Patient did have prior ASCUS last year with HPV negative. Patient will be notified once her Pap results become available.  - Irregular menses: Patient described elongated cycles, with occasional heavy menses. CBC, TSH collected today. Will notify patient once the results become available and make appropriate changes if necessary.

## 2014-07-04 NOTE — Patient Instructions (Signed)
I will call you with your lab results once they become available. Slowly taper up your Levemir dose until you see blood sugars between 100 and 110 fasting in the morning. Start at 52 units tonight, and taper up until you see either fasting blood glucose is between 100 and 110 or you reach 60 units of Levemir. Follow-up in 3 months with a new primary care provider. Attempt to exercise 150 minutes a week, and eat healthy.

## 2014-07-04 NOTE — Assessment & Plan Note (Signed)
Karina [name] is  28 y.o. presents to family medicine clinic for annual visit and diabetes follow-up. Well woman exam: Wet prep and Pap collected today. Patient had signs of yeast infection of the skin and vagina today. Wet prep with positive yeast. Nystatin cream was called in for external use, and Diflucan 1. Patient did have prior ASCUS last year with HPV negative. Patient will be notified once her Pap results become available.  

## 2014-07-04 NOTE — Assessment & Plan Note (Signed)
Karina [name] is  29 y.o. presents to family medicine clinic for annual visit and diabetes follow-up. Well woman exam: Wet prep and Pap collected today. Patient had signs of yeast infection of the skin and vagina today. Wet prep with positive yeast. Nystatin cream was called in for external use, and Diflucan 1. Patient did have prior ASCUS last year with HPV negative. Patient will be notified once her Pap results become available.

## 2014-07-04 NOTE — Progress Notes (Signed)
   Subjective:    Patient ID: Karina [name], female    DOB: [birth year], 29 y.o.   MRN: [id number]  HPI    Review of Systems     Objective:   Physical Exam        Assessment & Plan:

## 2014-07-04 NOTE — Assessment & Plan Note (Addendum)
Ramsey P Joseph Art is  29 y.o. presents to family medicine clinic for annual visit and diabetes follow-up.  - Diabetes: Patient's A1c today is 13.7. Uncertain if she is truly taking her medications every day. She is on Levemir 50 units daily as well as NovoLog 17-20 units 3 times a day. Patient was encouraged to increase Levemir to 55 units twice a day. Foot exam completed today and was normal. Patient's had recent ophthalmology appointment with moderate proliferative diabetic retinopathy she is due for a repeat visit as it has been a year, and it was advised her to follow-up in 6 months. Patient has been notified to make an appointment with her ophthalmologist. I have also asked her to make an appointment with Dr. Raymondo Band to follow-up on her insulin regimen, and attempt to get better control for her. Patient states she is watching her weight, but not exercising regulate. Encouraged her to exercise 150 minutes a week. Follow a diabetic diet.

## 2014-07-05 ENCOUNTER — Telehealth: Payer: Self-pay | Admitting: Family Medicine

## 2014-07-05 ENCOUNTER — Encounter: Payer: Self-pay | Admitting: Family Medicine

## 2014-07-05 NOTE — Telephone Encounter (Signed)
Please call pt, Patient's A1c  is 13.7. She is due for a repeat eye doctor visit as it has been a year, and it was advised her to follow-up in 6 months. Please encourage her to make this appointment with her eye doctor.  I have would like her to make an appointment with Dr. Raymondo Band (here ASAP) to follow-up on her insulin regimen, and attempt to get better control for her diabetes. I have mailed her results to her, since I was unable to get a hold of her by phone. Thanks.

## 2014-07-05 NOTE — Progress Notes (Signed)
I was preceptor the day of this visit.   

## 2014-07-05 NOTE — Telephone Encounter (Signed)
Patient returning call, tried scheduling an appt with Dr. Raymondo Band but his schedule for July is not complete yet. She will call back end of next week to try scheduling. Patient said she already saw her eye doctor, she went to [facility] in March or April. Per referrals, she was scheduled on 03/15/14.

## 2014-07-05 NOTE — Telephone Encounter (Signed)
Will forward to Dr. Claiborne Billings to make her aware. Jazmin Hartsell,CMA

## 2014-07-08 ENCOUNTER — Telehealth: Payer: Self-pay | Admitting: Family Medicine

## 2014-07-08 ENCOUNTER — Other Ambulatory Visit: Payer: Self-pay

## 2014-07-08 LAB — CYTOLOGY - PAP

## 2014-07-08 NOTE — Telephone Encounter (Signed)
Please call pt, her PAP was normal. PAP will need to be completed in 1-2 years. Thanks.

## 2014-07-08 NOTE — Telephone Encounter (Signed)
Pt informed of below. Zimmerman Rumple, April D, CMA  

## 2014-07-12 ENCOUNTER — Emergency Department (HOSPITAL_COMMUNITY)
Admission: EM | Admit: 2014-07-12 | Discharge: 2014-07-13 | Disposition: A | Discharge status: Home / Self Care | Payer: Medicaid Other | Attending: Emergency Medicine | Admitting: Emergency Medicine

## 2014-07-12 ENCOUNTER — Encounter (HOSPITAL_COMMUNITY): Payer: Self-pay | Admitting: *Deleted

## 2014-07-12 DIAGNOSIS — Z794 Long term (current) use of insulin: Secondary | ICD-10-CM | POA: Diagnosis not present

## 2014-07-12 DIAGNOSIS — Y9389 Activity, other specified: Secondary | ICD-10-CM | POA: Insufficient documentation

## 2014-07-12 DIAGNOSIS — S6991XA Unspecified injury of right wrist, hand and finger(s), initial encounter: Secondary | ICD-10-CM | POA: Diagnosis not present

## 2014-07-12 DIAGNOSIS — W228XXA Striking against or struck by other objects, initial encounter: Secondary | ICD-10-CM | POA: Diagnosis not present

## 2014-07-12 DIAGNOSIS — E119 Type 2 diabetes mellitus without complications: Secondary | ICD-10-CM | POA: Insufficient documentation

## 2014-07-12 DIAGNOSIS — Y9289 Other specified places as the place of occurrence of the external cause: Secondary | ICD-10-CM | POA: Insufficient documentation

## 2014-07-12 DIAGNOSIS — Y990 Civilian activity done for income or pay: Secondary | ICD-10-CM | POA: Diagnosis not present

## 2014-07-12 NOTE — ED Notes (Signed)
The pt caught her rt index finger between 2pieces of metal 1600 today  Cut swollen diff flexing it  lmp June 5th

## 2014-07-13 ENCOUNTER — Emergency Department (HOSPITAL_COMMUNITY): Payer: Medicaid Other

## 2014-07-13 MED ORDER — KETOROLAC TROMETHAMINE 60 MG/2ML IM SOLN
60.0000 mg | Freq: Once | INTRAMUSCULAR | Status: AC
  Administered 2014-07-13: 60 mg via INTRAMUSCULAR
  Filled 2014-07-13: qty 2

## 2014-07-13 MED ORDER — IBUPROFEN 600 MG PO TABS: 600.0000 mg | ORAL_TABLET | Freq: Four times a day (QID) | ORAL | Status: DC | PRN

## 2014-07-13 NOTE — ED Notes (Signed)
Pt has work information/workman's comp paperwork to be filled out,

## 2014-07-13 NOTE — Discharge Instructions (Signed)
Return to the emergency room with worsening of symptoms, new symptoms or with symptoms that are concerning, especially fevers, redness, swelling, red streaks, numbness, tingling, weakness, unable to move finger. RICE: Rest, Ice (three cycles of 20 mins on, 20mins off at least twice a day), compression/brace, elevation. Heating pad works well for back pain. Follow up with PCP or employer occupational panel if symptoms worsen or are persistent. Read below information and follow recommendations. Finger Sprain A finger sprain is a tear in one of the strong, fibrous tissues that connect the bones (ligaments) in your finger. The severity of the sprain depends on how much of the ligament is torn. The tear can be either partial or complete. CAUSES  Often, sprains are a result of a fall or accident. If you extend your hands to catch an object or to protect yourself, the force of the impact causes the fibers of your ligament to stretch too much. This excess tension causes the fibers of your ligament to tear. SYMPTOMS  You may have some loss of motion in your finger. Other symptoms include:  Bruising.  Tenderness.  Swelling. DIAGNOSIS  In order to diagnose finger sprain, your caregiver will physically examine your finger or thumb to determine how torn the ligament is. Your caregiver may also suggest an X-ray exam of your finger to make sure no bones are broken. TREATMENT  If your ligament is only partially torn, treatment usually involves keeping the finger in a fixed position (immobilization) for a short period. To do this, your caregiver will apply a bandage, cast, or splint to keep your finger from moving until it heals. For a partially torn ligament, the healing process usually takes 2 to 3 weeks. If your ligament is completely torn, you may need surgery to reconnect the ligament to the bone. After surgery a cast or splint will be applied and will need to stay on your finger or thumb for 4 to 6 weeks  while your ligament heals. HOME CARE INSTRUCTIONS  Keep your injured finger elevated, when possible, to decrease swelling.  To ease pain and swelling, apply ice to your joint twice a day, for 2 to 3 days:  Put ice in a plastic bag.  Place a towel between your skin and the bag.  Leave the ice on for 15 minutes.  Only take over-the-counter or prescription medicine for pain as directed by your caregiver.  Do not wear rings on your injured finger.  Do not leave your finger unprotected until pain and stiffness go away (usually 3 to 4 weeks).  Do not allow your cast or splint to get wet. Cover your cast or splint with a plastic bag when you shower or bathe. Do not swim.  Your caregiver may suggest special exercises for you to do during your recovery to prevent or limit permanent stiffness. SEEK IMMEDIATE MEDICAL CARE IF:  Your cast or splint becomes damaged.  Your pain becomes worse rather than better. MAKE SURE YOU:  Understand these instructions.  Will watch your condition.  Will get help right away if you are not doing well or get worse. Document Released: 02/05/2004 Document Revised: 03/22/2011 Document Reviewed: 08/31/2010 [facility] Patient Information 2015 [place], [place]. This information is not intended to replace advice given to you by your health care provider. Make sure you discuss any questions you have with your health care provider.  Jammed Finger A jammed finger is a term used to describe a variety of injuries. The injuries usually involve the joint in  the middle of the finger (not the joint near the tip of the finger, and not the joint close to the hand). Usually, a jammed finger involves injured tendons or ligaments (sprain). CAUSES  "Jamming" a finger usually refers to "stubbing" the finger on an object, such as a ball during an athletic activity. Usually, the joint is extended at the time of injury, and the blow forces the joint further into extension than it  normally goes. SYMPTOMS   Pain.  Swelling.  Discoloration and bruising around the joint.  Difficulty bending, straightening, and using the finger normally. DIAGNOSIS  An X-ray may be done to make sure there is no broken bone (fracture). TREATMENT   Put ice on the injured area.  Put ice in a plastic bag.  Place a towel between your skin and the bag.  Leave the ice on for 15-20 minutes at a time, 03-04 times a day.  Raise (elevate) the affected finger above the level of your heart to decrease swelling.  Take medicine as directed by your caregiver. Depending on the type of injury, your caregiver may also recommend that you:  "Buddy tape" the injured finger to the finger or fingers beside it.  Wear a protective splint.  Do strengthening exercises after the finger has begun to heal.  Do physical therapy to regain strength and mobility in the finger.  Follow up with a hand specialist. HOME CARE INSTRUCTIONS  Avoid activities that may injure the finger again until it is totally healed. SEEK IMMEDIATE MEDICAL CARE IF:   You develop pain that is more severe.  You develop increased swelling.  There is an obvious deformity in the joint.  You have severe bruising.  You have red or blue discoloration.  You or your child has an oral temperature above 102 F (38.9 C), not controlled by medicine.  You have an abnormally cold finger.  Feeling in your finger is absent or decreasing. MAKE SURE YOU:   Understand these instructions.  Will watch your condition.  Will get help right away if you are not doing well or get worse. Document Released: 06/17/2009 Document Revised: 03/22/2011 Document Reviewed: 06/17/2009 [facility] Patient Information 2015 [place], [place]. This information is not intended to replace advice given to you by your health care provider. Make sure you discuss any questions you have with your health care provider.

## 2014-07-13 NOTE — ED Provider Notes (Signed)
CSN: [id number]     Arrival date & time 07/12/14  2349 History   First MD Initiated Contact with Patient 07/12/14 2359     Chief Complaint  Patient presents with  . Finger Injury     (Consider location/radiation/quality/duration/timing/severity/associated sxs/prior Treatment) HPI  Karina [name] is a 29 y.o. female presenting with right index finger injury today at work 1600 hrs. Patient was pulling on a metal piece that was holding up mail and it became loose and struck her finger. Patient reports associated swelling and pain worse with movement. She denies any erythema, fevers, chills, numbness, tingling, weakness. She has not taken anything for her symptoms. She denies any wound.   Past Medical History  Diagnosis Date  . Diabetes mellitus     Type 2, insulin resistant   Past Surgical History  Procedure Laterality Date  . Cervical biopsy  2004  . Cesarean section N/A 06/02/2012    Procedure: CESAREAN SECTION;  Surgeon: Mora Bellman, MD;  Location: [facility];  Service: Obstetrics;  Laterality: N/A;   Family History  Problem Relation Age of Onset  . Diabetes Father    History  Substance Use Topics  . Smoking status: Never Smoker   . Smokeless tobacco: Never Used  . Alcohol Use: No   OB History    Gravida Para Term Preterm AB TAB SAB Ectopic Multiple Living   '1 1 1       1     '$ Review of Systems  Musculoskeletal: Positive for myalgias. Negative for joint swelling.  Skin: Negative for color change and wound.  Neurological: Negative for weakness and numbness.      Allergies  Review of patient's allergies indicates no known allergies.  Home Medications   Prior to Admission medications   Medication Sig Start Date End Date Taking? Authorizing Provider  ACCU-CHEK FASTCLIX LANCETS MISC Check sugar 5 x daily 01/31/14   Renee A Kuneff, DO  Blood Glucose Monitoring Suppl (ACCU-CHEK NANO SMARTVIEW) W/DEVICE KIT Check fasting blood glucose daily, then prior to every meal and  bedtime. (x5 a day) 11/16/13   Renee A Kuneff, DO  fluconazole (DIFLUCAN) 150 MG tablet Take 1 tablet (150 mg total) by mouth once. 07/04/14   Renee A Kuneff, DO  glucose blood (ACCU-CHEK SMARTVIEW) test strip Check sugar 5 x daily 11/16/13   Renee A Kuneff, DO  ibuprofen (ADVIL,MOTRIN) 600 MG tablet Take 1 tablet (600 mg total) by mouth every 6 (six) hours as needed. 07/13/14   Al Corpus, PA-C  insulin aspart (NOVOLOG) 100 UNIT/ML injection Inject 20-22 Units into the skin 3 (three) times daily before meals. 04/09/14   Renee A Kuneff, DO  insulin detemir (LEVEMIR) 100 UNIT/ML injection Inject 0.55 mLs (55 Units total) into the skin 2 (two) times daily. 07/04/14   Renee A Kuneff, DO  Insulin Pen Needle 31G X 8 MM MISC Check sugars two times per day 02/08/14   Nolon Rod, DO  lisinopril (PRINIVIL,ZESTRIL) 5 MG tablet Take 1 tablet (5 mg total) by mouth daily. 07/04/14   Renee A Kuneff, DO  nystatin cream (MYCOSTATIN) Apply 1 application topically 2 (two) times daily. 07/04/14   Renee A Kuneff, DO   BP 157/87 mmHg  Pulse 98  Temp(Src) 99.2 F (37.3 C) (Oral)  Resp 18  SpO2 99%  LMP 06/27/2014 Physical Exam  Constitutional: She appears well-developed and well-nourished. No distress.  HENT:  Head: Normocephalic and atraumatic.  Eyes: Conjunctivae are normal. Right eye exhibits no discharge. Left  eye exhibits no discharge.  Cardiovascular:  2+ radial pulses equal bilaterally  Pulmonary/Chest: Effort normal. No respiratory distress.  Musculoskeletal:  Full range of motion. No wrist, palm tenderness. Patient with tenderness to distal right phalanx. No wound or lesion. Mild swelling without erythema.  Neurological: She is alert. Coordination normal.  5 out of 5 strength in right upper extremity. Sensation intact. Intact flexion and extension of right index finger.  Skin: She is not diaphoretic.  Psychiatric: She has a normal mood and affect. Her behavior is normal.  Nursing note and vitals  reviewed.   ED Course  Procedures (including critical care time) Labs Review Labs Reviewed - No data to display  Imaging Review Dg Finger Index Right  07/13/2014   CLINICAL DATA:  29 year old female with trauma to the index finger.  EXAM: RIGHT INDEX FINGER 2+V  COMPARISON:  None.  FINDINGS: There is no evidence of fracture or dislocation. There is no evidence of arthropathy or other focal bone abnormality. Soft tissue swelling. No radiopaque foreign object.  IMPRESSION: No fracture or dislocation.  No radiopaque foreign object.   Electronically Signed   By: Anner Crete M.D.   On: 07/13/2014 00:38     EKG Interpretation None      MDM   Final diagnoses:  Injury of right index finger, initial encounter   Pt presenting with injury to right index finger while at work. Neurovascularly intact. No erythema, swelling. Full range of motion. I doubt septic arthritis. X-ray without evidence of acute abnormalities. Discussed rice protocol as well as ibuprofen use. Follow-up with occupational panel as needed for persistent pain.  Discussed return precautions with patient. Discussed all results and patient verbalizes understanding and agrees with plan.   This is a shared patient. This patient was discussed with the physician who saw and evaluated the patient and agrees with the plan.   Al Corpus, PA-C 07/13/14 2132  April Palumbo, MD 07/13/14 2348

## 2014-07-17 ENCOUNTER — Other Ambulatory Visit (INDEPENDENT_AMBULATORY_CARE_PROVIDER_SITE_OTHER): Payer: Medicaid Other

## 2014-07-17 DIAGNOSIS — E118 Type 2 diabetes mellitus with unspecified complications: Secondary | ICD-10-CM

## 2014-07-17 DIAGNOSIS — E1140 Type 2 diabetes mellitus with diabetic neuropathy, unspecified: Secondary | ICD-10-CM | POA: Diagnosis not present

## 2014-07-17 DIAGNOSIS — I10 Essential (primary) hypertension: Secondary | ICD-10-CM | POA: Diagnosis not present

## 2014-07-17 DIAGNOSIS — IMO0002 Reserved for concepts with insufficient information to code with codable children: Secondary | ICD-10-CM

## 2014-07-17 DIAGNOSIS — B373 Candidiasis of vulva and vagina: Secondary | ICD-10-CM | POA: Diagnosis not present

## 2014-07-17 DIAGNOSIS — E1165 Type 2 diabetes mellitus with hyperglycemia: Secondary | ICD-10-CM

## 2014-07-17 LAB — POCT GLYCOSYLATED HEMOGLOBIN (HGB A1C): HEMOGLOBIN A1C: 13.3

## 2014-07-17 NOTE — Progress Notes (Signed)
A1C only today. Busick, Rodena Medinobert Lee

## 2014-09-09 ENCOUNTER — Encounter: Payer: Self-pay | Admitting: Pharmacist

## 2014-09-09 ENCOUNTER — Ambulatory Visit (INDEPENDENT_AMBULATORY_CARE_PROVIDER_SITE_OTHER): Payer: Medicaid Other | Admitting: Pharmacist

## 2014-09-09 DIAGNOSIS — E1165 Type 2 diabetes mellitus with hyperglycemia: Secondary | ICD-10-CM

## 2014-09-09 DIAGNOSIS — I10 Essential (primary) hypertension: Secondary | ICD-10-CM | POA: Diagnosis not present

## 2014-09-09 DIAGNOSIS — E1140 Type 2 diabetes mellitus with diabetic neuropathy, unspecified: Secondary | ICD-10-CM | POA: Diagnosis not present

## 2014-09-09 DIAGNOSIS — B373 Candidiasis of vulva and vagina: Secondary | ICD-10-CM | POA: Diagnosis not present

## 2014-09-09 DIAGNOSIS — E118 Type 2 diabetes mellitus with unspecified complications: Secondary | ICD-10-CM

## 2014-09-09 DIAGNOSIS — IMO0002 Reserved for concepts with insufficient information to code with codable children: Secondary | ICD-10-CM

## 2014-09-09 MED ORDER — INSULIN PEN NEEDLE 31G X 8 MM MISC
1.0000 | Status: DC | PRN
Start: 2014-09-09 — End: 2015-04-23

## 2014-09-09 MED ORDER — INSULIN ASPART 100 UNIT/ML FLEXPEN
25.0000 [IU] | PEN_INJECTOR | Freq: Three times a day (TID) | SUBCUTANEOUS | Status: DC
Start: 2014-09-09 — End: 2014-09-09

## 2014-09-09 MED ORDER — INSULIN ASPART 100 UNIT/ML FLEXPEN: 25.0000 [IU] | PEN_INJECTOR | Freq: Three times a day (TID) | SUBCUTANEOUS | Status: DC

## 2014-09-09 NOTE — Patient Instructions (Addendum)
Good to see you today!  Start using novolog pen 25 units three times daily with meals Start taking 50 units twice daily of levemir twice daily.  Try to limit intake of sugary drinks, juices, and bread/carbohydrates.  Follow up in the Rx clinic in 3 weeks.

## 2014-09-09 NOTE — Progress Notes (Signed)
S:    Patient arrives in good spirits with positive attitude.    Presents for diabetes management and medication therapy adjustment.   Patient reports having history of Diabetes since she had her baby at which time she had an insulin pump.  Patient reports significant non-adherence with her insulin.  Current diabetes medications include: Levemir 55units QHS and 25 units QHS  Patient denies hypoglycemic events.   Patient reported dietary habits: of drinking large amounts of orange juice (20ounces) at times and likes to eat bread.  Patient reported exercise habits:  States she is very active with her children and her job at the [employer].    Patient symptomatic yeast infections which have recently be treated.     O:  Lab Results  Component Value Date   HGBA1C 13.3 07/17/2014     Home fasting CBG: > 300 2 hour post-prandial/random CBG: > 300  A/P: Diabetes diagnosed several years ago and currently under poor control due to nonadherence and poor dietary habits.     Denies hypoglycemic events and is able to verbalize appropriate hypoglycemia management plan.  Reports non-adherence with medication. Control is suboptimal due to reluctance to take insulin vial dosing and requests Novolog as a pen. Adjusted dose of basal insulin Levemir to 50units TWICE daily as she was only taking 55 units at nights previously.  Adjusted dose of rapid insulin Novolog (insulin aspart) to 25 units prior to each of three meals daily with use of Novolog Pen provided as a sample.  Discussed short-term goals of getting all blood sugars < 300 and improving symptom control including decrease lethargy and minimizing infections.   Written patient instructions provided.  Follow up in Pharmacist Clinic Visit 3 weeks.   Total time in face to face counseling 35 minutes.  Patient seen with Sherle Poe, PharmD Resident.  Has been able to tolerate "glucophage" in the past but was unable to tolerate "metformin" - Consider  Metformin XR or other combination with metformin in the future.  Could also consider Janumet (patient's mother has had success on this) or GLP or possibly an SGLT agent at next visit.

## 2014-09-09 NOTE — Assessment & Plan Note (Signed)
Diabetes diagnosed several years ago and currently under poor control due to nonadherence and poor dietary habits.     Denies hypoglycemic events and is able to verbalize appropriate hypoglycemia management plan.  Reports non-adherence with medication. Control is suboptimal due to reluctance to take insulin vial dosing and requests Novolog as a pen. Adjusted dose of basal insulin Levemir to 50units TWICE daily as she was only taking 55 units at nights previously.  Adjusted dose of rapid insulin Novolog (insulin aspart) to 25 units prior to each of three meals daily with use of Novolog Pen provided as a sample.  Discussed short-term goals of getting all blood sugars < 300 and improving symptom control including decrease lethargy and minimizing infections

## 2014-09-09 NOTE — Progress Notes (Signed)
Patient ID: Karina [name], female   DOB: [birth year], 29 y.o.   MRN: [id number] Reviewed:  Agree with Dr. Macky Lower documentation and management.

## 2014-09-27 ENCOUNTER — Encounter (HOSPITAL_COMMUNITY): Payer: Self-pay | Admitting: *Deleted

## 2014-09-27 ENCOUNTER — Emergency Department (HOSPITAL_COMMUNITY)
Admission: EM | Admit: 2014-09-27 | Discharge: 2014-09-27 | Disposition: A | Discharge status: Home / Self Care | Payer: Medicaid Other | Source: Home / Self Care | Attending: Family Medicine | Admitting: Family Medicine

## 2014-09-27 DIAGNOSIS — M7711 Lateral epicondylitis, right elbow: Secondary | ICD-10-CM

## 2014-09-27 MED ORDER — DICLOFENAC SODIUM 1 % TD GEL: 4.0000 g | Freq: Four times a day (QID) | TRANSDERMAL | Status: DC

## 2014-09-27 NOTE — ED Provider Notes (Signed)
CSN: [id number]     Arrival date & time 09/27/14  1300 History   First MD Initiated Contact with Patient 09/27/14 1320     Chief Complaint  Patient presents with  . Arm Pain   (Consider location/radiation/quality/duration/timing/severity/associated sxs/prior Treatment) Patient is a 29 y.o. female presenting with arm pain. The history is provided by the patient.  Arm Pain This is a new problem. The current episode started more than 1 week ago (sx over 2 weeks, spreading down arm.). The problem has been gradually worsening. Associated symptoms comments: Onset before work job currently however worse with lifting otj..    Past Medical History  Diagnosis Date  . Diabetes mellitus     Type 2, insulin resistant   Past Surgical History  Procedure Laterality Date  . Cervical biopsy  2004  . Cesarean section N/A 06/02/2012    Procedure: CESAREAN SECTION;  Surgeon: Catalina Antigua, MD;  Location: [facility];  Service: Obstetrics;  Laterality: N/A;   Family History  Problem Relation Age of Onset  . Diabetes Father    Social History  Substance Use Topics  . Smoking status: Never Smoker   . Smokeless tobacco: Never Used  . Alcohol Use: No   OB History    Gravida Para Term Preterm AB TAB SAB Ectopic Multiple Living   1 1 1       1      Review of Systems  Constitutional: Negative.   Musculoskeletal: Positive for myalgias. Negative for joint swelling.  Skin: Negative.   All other systems reviewed and are negative.   Allergies  Review of patient's allergies indicates no known allergies.  Home Medications   Prior to Admission medications   Medication Sig Start Date End Date Taking? Authorizing Provider  ACCU-CHEK FASTCLIX LANCETS MISC Check sugar 5 x daily 01/31/14   Renee A Kuneff, DO  diclofenac sodium (VOLTAREN) 1 % GEL Apply 4 g topically 4 (four) times daily. Please instruct in dosing 09/27/14   Linna Hoff, MD  glucose blood (ACCU-CHEK SMARTVIEW) test strip Check sugar 5 x daily  11/16/13   Renee A Kuneff, DO  insulin aspart (NOVOLOG FLEXPEN) 100 UNIT/ML FlexPen Inject 25 Units into the skin 3 (three) times daily with meals. 09/09/14   Moses Manners, MD  insulin detemir (LEVEMIR) 100 UNIT/ML injection Inject 0.5 mLs (50 Units total) into the skin 2 (two) times daily. 09/09/14   Moses Manners, MD  Insulin Pen Needle (B-D ULTRAFINE III SHORT PEN) 31G X 8 MM MISC 1 Container by Does not apply route as needed. 09/09/14   Moses Manners, MD  Insulin Pen Needle 31G X 8 MM MISC Check sugars two times per day 02/08/14   Briscoe Deutscher, DO  lisinopril (PRINIVIL,ZESTRIL) 5 MG tablet Take 1 tablet (5 mg total) by mouth daily. 07/04/14   Renee A Kuneff, DO  nystatin cream (MYCOSTATIN) Apply 1 application topically 2 (two) times daily. 07/04/14   Renee A Kuneff, DO   Meds Ordered and Administered this Visit  Medications - No data to display  BP 126/86 mmHg  Pulse 100  Temp(Src) 98.9 F (37.2 C) (Oral)  Resp 16  SpO2 98%  LMP 09/27/2014 No data found.   Physical Exam  Constitutional: She is oriented to person, place, and time. She appears well-developed and well-nourished.  Musculoskeletal: She exhibits tenderness.       Right elbow: She exhibits decreased range of motion. She exhibits no swelling, no effusion and no deformity. Tenderness  found. Lateral epicondyle tenderness noted. No radial head tenderness noted.       Arms: Neurological: She is alert and oriented to person, place, and time.  Skin: Skin is warm and dry.  Nursing note and vitals reviewed.   ED Course  Procedures (including critical care time)  Labs Review Labs Reviewed - No data to display  Imaging Review No results found.   Visual Acuity Review  Right Eye Distance:   Left Eye Distance:   Bilateral Distance:    Right Eye Near:   Left Eye Near:    Bilateral Near:         MDM   1. Epicondylitis, lateral (tennis elbow), right       Linna Hoff, MD 09/27/14 2008

## 2014-09-27 NOTE — ED Notes (Signed)
Pt reports  Symptoms   Of     r  Arm  Pain      X  2  Weeks        Pt    denys     Any  specefic  Injury            pain radiates  Down r  Arm       She  Reports she  Uses  Her  Arm a  Lot  At  Work

## 2014-09-27 NOTE — Discharge Instructions (Signed)
Ice, medicine, stretch and see orthopedist if further problems.

## 2014-10-03 ENCOUNTER — Ambulatory Visit: Payer: Medicaid Other | Admitting: Pharmacist

## 2015-02-06 ENCOUNTER — Ambulatory Visit: Payer: Medicaid Other | Admitting: Family Medicine

## 2015-03-12 ENCOUNTER — Ambulatory Visit: Payer: Medicaid Other | Admitting: Family Medicine

## 2015-03-14 ENCOUNTER — Ambulatory Visit: Payer: Medicaid Other | Admitting: Family Medicine

## 2015-03-21 ENCOUNTER — Ambulatory Visit (INDEPENDENT_AMBULATORY_CARE_PROVIDER_SITE_OTHER): Payer: Medicaid Other | Admitting: Family Medicine

## 2015-03-21 VITALS — BP 112/54 | HR 114 | Temp 98.3°F | Wt 221.7 lb

## 2015-03-21 DIAGNOSIS — I10 Essential (primary) hypertension: Secondary | ICD-10-CM | POA: Diagnosis not present

## 2015-03-21 DIAGNOSIS — E113399 Type 2 diabetes mellitus with moderate nonproliferative diabetic retinopathy without macular edema, unspecified eye: Secondary | ICD-10-CM

## 2015-03-21 DIAGNOSIS — E118 Type 2 diabetes mellitus with unspecified complications: Secondary | ICD-10-CM

## 2015-03-21 DIAGNOSIS — Z6839 Body mass index (BMI) 39.0-39.9, adult: Secondary | ICD-10-CM

## 2015-03-21 DIAGNOSIS — E1165 Type 2 diabetes mellitus with hyperglycemia: Secondary | ICD-10-CM | POA: Diagnosis not present

## 2015-03-21 DIAGNOSIS — E1140 Type 2 diabetes mellitus with diabetic neuropathy, unspecified: Secondary | ICD-10-CM | POA: Diagnosis not present

## 2015-03-21 DIAGNOSIS — B373 Candidiasis of vulva and vagina: Secondary | ICD-10-CM | POA: Diagnosis not present

## 2015-03-21 LAB — POCT GLYCOSYLATED HEMOGLOBIN (HGB A1C): HEMOGLOBIN A1C: 14.1

## 2015-03-21 MED ORDER — GLUCOSE BLOOD VI STRP: ORAL_STRIP | Status: DC

## 2015-03-21 MED ORDER — INSULIN DETEMIR 100 UNIT/ML ~~LOC~~ SOLN: 50.0000 [IU] | Freq: Two times a day (BID) | SUBCUTANEOUS | Status: DC

## 2015-03-21 NOTE — Patient Instructions (Signed)
Thank you for coming in,   I am refilling your levemir and sent in a new strips.   I will call or send a letter with the results from today.   Please follow up with me in two weeks.   Please bring your blood sugar measurements with you to this appointment.   Please bring all of your medications with you to each visit.   Sign up for My Chart to have easy access to your labs results, and communication with your Primary care physician   Please feel free to call with any questions or concerns at any time, at [phone]. --Dr. Jordan LikesSchmitz  Diet Recommendations for Diabetes   Starchy (carb) foods include: Bread, rice, pasta, potatoes, corn, crackers, bagels, muffins, all baked goods.   Protein foods include: Meat, fish, poultry, eggs, dairy foods, and beans such as pinto and kidney beans (beans also provide carbohydrate).   1. Eat at least 3 meals and 1-2 snacks per day. Never go more than 4-5 hours while awake without eating.  2. Limit starchy foods to TWO per meal and ONE per snack. ONE portion of a starchy  food is equal to the following:   - ONE slice of bread (or its equivalent, such as half of a hamburger bun).   - 1/2 cup of a "scoopable" starchy food such as potatoes or rice.   - 1 OUNCE (28 grams) of starchy snack foods such as crackers or pretzels (look on label).   - 15 grams of carbohydrate as shown on food label.  3. Both lunch and dinner should include a protein food, a carb food, and vegetables.   - Obtain twice as many veg's as protein or carbohydrate foods for both lunch and dinner.   - Try to keep frozen veg's on hand for a quick vegetable serving.     - Fresh or frozen veg's are best.  4. Breakfast should always include protein.

## 2015-03-21 NOTE — Progress Notes (Signed)
   Subjective:    Karina [name] - 30 y.o. female MRN [id number]  Date of birth: Mar 14, 1985  CC HTN, DM2  HPI  Karina [name] is here for HTN, DM2.  CHRONIC DIABETES Diagnosed in 2004  She has been on insulin since 2009  Suppose to take novolog 25 U with every meal  levemir 25 U BID but doesn't know where this medication is. Constant and severe in nature.  No CAD but does have HTN and moderate retinopathy   Disease Monitoring  Blood Sugar Ranges: doesn't check. Reports her noon meal is the largest   Polyuria: no   Visual problems: no   Medication Compliance: no  Medication Side Effects  Hypoglycemia: no   Preventitive Health Care  Eye Exam: last year   Foot Exam: today   Diet pattern: trying to get off of meat. Watching her starches. Did well when she was carb counting with her daughter.   Exercise: runs around with her daughter. Purchased a mountain bike.  HTN Disease Monitoring: Home BP Monitoring none   Medications:historically taking lisinopril but not currently  Chest pain- no     Dyspnea- no Compliance-  Intermittent .  Lightheadedness-  no   Edema- no  Morbid Obesity:  Constant and severe.  Has struggled with her weight for some time.  She is wanting to get more education about bariatric surgery  Has tried different diets but failed and unable to keep the weight off.  Her exercise is limited since she has to take care of her child.   SH: no tobacco or EtOH.  PMH: HTN, DM2, nonproliferative diabetic retinopathy, morbid obesity,   Health Maintenance:  - A1c today  - foot exam today  Health Maintenance Due  Topic Date Due  . PNEUMOCOCCAL POLYSACCHARIDE VACCINE (1) 07/31/1985  . OPHTHALMOLOGY EXAM  05/24/2014  . FOOT EXAM  10/23/2014    Review of Systems See HPI     Objective:   Physical Exam BP 112/54 mmHg  Pulse 114  Temp(Src) 98.3 F (36.8 C) (Oral)  Wt 221 lb 11.2 oz (100.562 kg) Gen: NAD, alert, cooperative with exam,  HEENT: NCAT,  EOMI, clear conjunctiva,  CV: regular rhythm, tachycardic good S1/S2, no murmur, no edema  Resp: CTABL, no wheezes, non-labored Neuro: no gross deficits.     Assessment & Plan:   Essential hypertension, benign History of HTN controlled today  - holding lisinopril  - can add at a later date at low dose for protection but need to counsel about pregnancy    Type II diabetes mellitus with complication, uncontrolled (HCC) Uncontrolled.  - restarted her medications.  - advised to follow up 1 month.   Moderate nonproliferative diabetic retinopathy (HCC) Advised she be seen soon since it has been over a year since she was seen last.  She reports some blurry vision in looking at distances.  - she will call for ophthalmology.   Morbid obesity (HCC) She would like to consider bariatric surgery  This would help with her long time uncontrolled diabetes if she is adherent with the program - referral to surgery placed

## 2015-03-22 LAB — BASIC METABOLIC PANEL WITH GFR
BUN: 17 mg/dL (ref 7–25)
CHLORIDE: 95 mmol/L — AB (ref 98–110)
CO2: 25 mmol/L (ref 20–31)
CREATININE: 0.93 mg/dL (ref 0.50–1.10)
Calcium: 10.1 mg/dL (ref 8.6–10.2)
GFR, EST NON AFRICAN AMERICAN: 82 mL/min (ref >=60)
GFR, Est African American: >89 mL/min (ref >=60)
Glucose, Bld: 540 mg/dL (ref 65–99)
POTASSIUM: 4.9 mmol/L (ref 3.5–5.3)
SODIUM: 134 mmol/L — AB (ref 135–146)

## 2015-03-22 LAB — LIPID PANEL
CHOLESTEROL: 201 mg/dL — AB (ref 125–200)
HDL: 60 mg/dL (ref >=46)
LDL CALC: 111 mg/dL (ref <130)
TRIGLYCERIDES: 150 mg/dL — AB (ref <150)
Total CHOL/HDL Ratio: 3.4 Ratio (ref <=5.0)
VLDL: 30 mg/dL (ref <30)

## 2015-03-23 ENCOUNTER — Telehealth: Payer: Self-pay | Admitting: Family Medicine

## 2015-03-23 NOTE — Assessment & Plan Note (Signed)
Advised she be seen soon since it has been over a year since she was seen last.  She reports some blurry vision in looking at distances.  - she will call for ophthalmology.

## 2015-03-23 NOTE — Assessment & Plan Note (Signed)
History of HTN controlled today  - holding lisinopril  - can add at a later date at low dose for protection but need to counsel about pregnancy

## 2015-03-23 NOTE — Telephone Encounter (Signed)
Received page from lab for critical value from patient's labs. Glucose 540. Called patient. She reports having recently eaten ~1hr prior to lab draw. She also hadn't taken her meds that day. She states she has taken her CBGs at home more recently since the appt Friday. CBGs have been in mid-200s. Does not endorse any symptoms at this time.   I asked patient to make sure to track CBGs today. Take meds as prescribed, and then discussed situations that would warrant further workup regarding her CBG levels and symptoms.   Patient stated her understanding and thanked me for the call. No further questions.   Kathee DeltonIan D McKeag, MD,MS,  PGY2 03/23/2015 8:52 AM

## 2015-03-23 NOTE — Assessment & Plan Note (Signed)
Uncontrolled.  - restarted her medications.  - advised to follow up 1 month.

## 2015-03-23 NOTE — Assessment & Plan Note (Signed)
She would like to consider bariatric surgery  This would help with her long time uncontrolled diabetes if she is adherent with the program - referral to surgery placed

## 2015-03-24 ENCOUNTER — Encounter: Payer: Self-pay | Admitting: Family Medicine

## 2015-03-26 ENCOUNTER — Telehealth: Payer: Self-pay | Admitting: Family Medicine

## 2015-03-26 DIAGNOSIS — Z794 Long term (current) use of insulin: Principal | ICD-10-CM

## 2015-03-26 DIAGNOSIS — E1165 Type 2 diabetes mellitus with hyperglycemia: Secondary | ICD-10-CM

## 2015-03-26 DIAGNOSIS — E118 Type 2 diabetes mellitus with unspecified complications: Principal | ICD-10-CM

## 2015-03-26 DIAGNOSIS — IMO0002 Reserved for concepts with insufficient information to code with codable children: Secondary | ICD-10-CM

## 2015-03-26 NOTE — Telephone Encounter (Signed)
Pt is experiencing severe eye pressure and pain and sensitivity to light  in her left eye. This started suddenly yesterday.  Please advise as to what to do

## 2015-03-26 NOTE — Telephone Encounter (Signed)
Forwarding to PCP as an FYI and to see if there is anything he would like for pt to do. Tried to call to set appointment for tomorrow, waiting for her to call back. Lamonte SakaiZimmerman Rumple, April D, [place]

## 2015-03-26 NOTE — Telephone Encounter (Signed)
LVM to call back to clinic to see how the pt was feeling today and if not any better to see about scheduling her an appointment for tomorrow.  Also if it gets worse to let her know that she should go to the ED to have them look at it. Lamonte SakaiZimmerman Rumple, Ruthetta Koopmann D, [place]

## 2015-03-26 NOTE — Telephone Encounter (Signed)
Called and left a voicemail for patient stating that she should be seen immediately in the ED or urgent care in regards to her eye pain or vision change.   If any questions then please take the best time and phone number to call and I will try to call her back.   Myra RudeJeremy E Schmitz, MD PGY-3, [facility] 03/26/2015, 4:48 PM

## 2015-03-27 ENCOUNTER — Encounter (HOSPITAL_COMMUNITY): Payer: Self-pay | Admitting: Nurse Practitioner

## 2015-03-27 ENCOUNTER — Emergency Department (HOSPITAL_COMMUNITY)
Admission: EM | Admit: 2015-03-27 | Discharge: 2015-03-28 | Disposition: A | Discharge status: Home / Self Care | Payer: Medicaid Other | Attending: Emergency Medicine | Admitting: Emergency Medicine

## 2015-03-27 DIAGNOSIS — Z79899 Other long term (current) drug therapy: Secondary | ICD-10-CM | POA: Insufficient documentation

## 2015-03-27 DIAGNOSIS — E1165 Type 2 diabetes mellitus with hyperglycemia: Secondary | ICD-10-CM | POA: Diagnosis not present

## 2015-03-27 DIAGNOSIS — Z794 Long term (current) use of insulin: Secondary | ICD-10-CM | POA: Diagnosis not present

## 2015-03-27 DIAGNOSIS — H538 Other visual disturbances: Secondary | ICD-10-CM | POA: Diagnosis present

## 2015-03-27 DIAGNOSIS — H5712 Ocular pain, left eye: Secondary | ICD-10-CM | POA: Insufficient documentation

## 2015-03-27 DIAGNOSIS — Z7982 Long term (current) use of aspirin: Secondary | ICD-10-CM | POA: Diagnosis not present

## 2015-03-27 DIAGNOSIS — R739 Hyperglycemia, unspecified: Secondary | ICD-10-CM

## 2015-03-27 LAB — BASIC METABOLIC PANEL
Anion gap: 12 (ref 5–15)
BUN: 8 mg/dL (ref 6–20)
CHLORIDE: 101 mmol/L (ref 101–111)
CO2: 22 mmol/L (ref 22–32)
CREATININE: 0.7 mg/dL (ref 0.44–1.00)
Calcium: 9.2 mg/dL (ref 8.9–10.3)
GFR calc non Af Amer: >60 mL/min (ref >60)
Glucose, Bld: 281 mg/dL — ABNORMAL HIGH (ref 65–99)
Potassium: 3.7 mmol/L (ref 3.5–5.1)
SODIUM: 135 mmol/L (ref 135–145)

## 2015-03-27 LAB — URINALYSIS, ROUTINE W REFLEX MICROSCOPIC
Bilirubin Urine: NEGATIVE
Ketones, ur: NEGATIVE mg/dL
LEUKOCYTES UA: NEGATIVE
NITRITE: NEGATIVE
PROTEIN: 30 mg/dL — AB
Specific Gravity, Urine: 1.024 (ref 1.005–1.030)
pH: 6 (ref 5.0–8.0)

## 2015-03-27 LAB — URINE MICROSCOPIC-ADD ON

## 2015-03-27 LAB — CBC
HCT: 38 % (ref 36.0–46.0)
Hemoglobin: 12.4 g/dL (ref 12.0–15.0)
MCH: 27 pg (ref 26.0–34.0)
MCHC: 32.6 g/dL (ref 30.0–36.0)
MCV: 82.6 fL (ref 78.0–100.0)
PLATELETS: 337 10*3/uL (ref 150–400)
RBC: 4.6 MIL/uL (ref 3.87–5.11)
RDW: 13 % (ref 11.5–15.5)
WBC: 5.1 10*3/uL (ref 4.0–10.5)

## 2015-03-27 LAB — CBG MONITORING, ED
GLUCOSE-CAPILLARY: 379 mg/dL — AB (ref 65–99)
Glucose-Capillary: 253 mg/dL — ABNORMAL HIGH (ref 65–99)

## 2015-03-27 MED ORDER — SODIUM CHLORIDE 0.9 % IV BOLUS (SEPSIS)
1000.0000 mL | Freq: Once | INTRAVENOUS | Status: AC
  Administered 2015-03-28: 1000 mL via INTRAVENOUS

## 2015-03-27 MED ORDER — TETRACAINE HCL 0.5 % OP SOLN
1.0000 [drp] | Freq: Once | OPHTHALMIC | Status: AC
  Administered 2015-03-28: 1 [drp] via OPHTHALMIC
  Filled 2015-03-27: qty 2

## 2015-03-27 MED ORDER — FLUORESCEIN SODIUM 1 MG OP STRP
1.0000 | ORAL_STRIP | Freq: Once | OPHTHALMIC | Status: AC
  Administered 2015-03-28: 1 via OPHTHALMIC
  Filled 2015-03-27: qty 1

## 2015-03-27 NOTE — ED Notes (Signed)
Denies any current pain

## 2015-03-27 NOTE — ED Provider Notes (Signed)
CSN: [id number]     Arrival date & time 03/27/15  1721 History   First MD Initiated Contact with Patient 03/27/15 2315     Chief Complaint  Patient presents with  . Blurred Vision    Karina [name] Ao Pautler is a 30 y.o. female with a history of diabetes who presents to the ED complaining of intermittent left eye pain and pressure for the past two days.  She currently complains of left eye pain and some blurry vision intermittently. She reports some double vision intermittently that is resolved. She reports aspirin is been helping her symptoms. She reports the symptoms can occur when she is at rest or with activity. She is unable to identify alleviating or aggravating factors of her left eye pain. She does not wear contacts or glasses. She denies any foreign body sensation.  She denies having any headaches with her left eye pain. She reports her blood sugars have been more elevated recently. She also reports sensitivity to the light. The patient denies fevers, chills, headache, neck pain, numbness, tingling, weakness, abdominal pain, nausea, vomiting diarrhea.  The history is provided by the patient. No language interpreter was used.    Past Medical History  Diagnosis Date  . Diabetes mellitus     Type 2, insulin resistant   Past Surgical History  Procedure Laterality Date  . Cervical biopsy  2004  . Cesarean section N/A 06/02/2012    Procedure: CESAREAN SECTION;  Surgeon: Catalina AntiguaPeggy Constant, MD;  Location: [facility];  Service: Obstetrics;  Laterality: N/A;   Family History  Problem Relation Age of Onset  . Diabetes Father    Social History  Substance Use Topics  . Smoking status: Never Smoker   . Smokeless tobacco: Never Used  . Alcohol Use: No   OB History    Gravida Para Term Preterm AB TAB SAB Ectopic Multiple Living   1 1 1       1      Review of Systems  Constitutional: Negative for fever and chills.  HENT: Negative for congestion and sore throat.   Eyes: Positive for photophobia, pain  and visual disturbance. Negative for discharge and redness.  Respiratory: Negative for cough, shortness of breath and wheezing.   Cardiovascular: Negative for chest pain and palpitations.  Gastrointestinal: Negative for nausea, vomiting, abdominal pain and diarrhea.  Genitourinary: Negative for dysuria.  Musculoskeletal: Negative for back pain and neck pain.  Skin: Negative for rash.  Neurological: Negative for dizziness, syncope, weakness, light-headedness, numbness and headaches.      Allergies  Review of patient's allergies indicates no known allergies.  Home Medications   Prior to Admission medications   Medication Sig Start Date End Date Taking? Authorizing Provider  ACCU-CHEK FASTCLIX LANCETS MISC Check sugar 5 x daily 01/31/14  Yes Renee A Kuneff, DO  aspirin 325 MG tablet Take 650 mg by mouth daily as needed for mild pain.   Yes Historical Provider, MD  glucose blood (ACCU-CHEK SMARTVIEW) test strip Check sugar 5 x daily 11/16/13  Yes Renee A Kuneff, DO  glucose blood test strip Use as instructed 03/21/15  Yes Myra RudeJeremy E Schmitz, MD  insulin aspart (NOVOLOG FLEXPEN) 100 UNIT/ML FlexPen Inject 25 Units into the skin 3 (three) times daily with meals. 09/09/14  Yes Moses MannersWilliam A Hensel, MD  insulin detemir (LEVEMIR) 100 UNIT/ML injection Inject 0.5 mLs (50 Units total) into the skin 2 (two) times daily. 03/21/15  Yes Myra RudeJeremy E Schmitz, MD  Insulin Pen Needle (B-D ULTRAFINE III SHORT  PEN) 31G X 8 MM MISC 1 Container by Does not apply route as needed. 09/09/14  Yes Moses Manners, MD   BP 118/69 mmHg  Pulse 92  Temp(Src) 98.9 F (37.2 C) (Oral)  Resp 18  SpO2 100% Physical Exam  Constitutional: She is oriented to person, place, and time. She appears well-developed and well-nourished. No distress.   Nontoxic-appearing overweight female.  HENT:  Head: Normocephalic and atraumatic.  Mouth/Throat: Oropharynx is clear and moist.  Eyes: Conjunctivae and EOM are normal. Pupils are equal,  round, and reactive to light. Right eye exhibits no discharge. Left eye exhibits no discharge.   EOMs are intact bilaterally. Vision is grossly intact.  bilateral eyes were anesthetized with tetracaine and stained with fluorescein. No fluorescein uptake on exam. No corneal abrasions or ulcers. No dendritic lesions.  intraocular pressures were checked with Tono-Pen. Pressures were less than 20 in her bilateral eyes with multiple checks.  Neck: Normal range of motion. Neck supple.  Cardiovascular: Normal rate, regular rhythm, normal heart sounds and intact distal pulses.  Exam reveals no gallop and no friction rub.   No murmur heard. Pulmonary/Chest: Effort normal and breath sounds normal. No respiratory distress. She has no wheezes. She has no rales.  Abdominal: Soft. There is no tenderness. There is no guarding.  Musculoskeletal: She exhibits no edema.  Lymphadenopathy:    She has no cervical adenopathy.  Neurological: She is alert and oriented to person, place, and time. No cranial nerve deficit. Coordination normal.   Patient is alert and oriented 3. Cranial nerves are intact. Sensation intact to bilateral upper and lower extremities. Vision is grossly intact. Speech is clear and coherent. Normal gait. EOMs are intact.  Skin: Skin is warm and dry. No rash noted. She is not diaphoretic. No erythema. No pallor.  Psychiatric: She has a normal mood and affect. Her behavior is normal.  Nursing note and vitals reviewed.   ED Course  Procedures (including critical care time) Labs Review Labs Reviewed  BASIC METABOLIC PANEL - Abnormal; Notable for the following:    Glucose, Bld 281 (*)    All other components within normal limits  URINALYSIS, ROUTINE W REFLEX MICROSCOPIC (NOT AT [facility]) - Abnormal; Notable for the following:    Glucose, UA >1000 (*)    Hgb urine dipstick LARGE (*)    Protein, ur 30 (*)    All other components within normal limits  URINE MICROSCOPIC-ADD ON - Abnormal; Notable  for the following:    Squamous Epithelial / LPF 0-5 (*)    Bacteria, UA FEW (*)    All other components within normal limits  CBG MONITORING, ED - Abnormal; Notable for the following:    Glucose-Capillary 253 (*)    All other components within normal limits  CBG MONITORING, ED - Abnormal; Notable for the following:    Glucose-Capillary 379 (*)    All other components within normal limits  CBG MONITORING, ED - Abnormal; Notable for the following:    Glucose-Capillary 268 (*)    All other components within normal limits  CBC  CBG MONITORING, ED    Imaging Review No results found. I have personally reviewed and evaluated these lab results as part of my medical decision-making.   EKG Interpretation None      Filed Vitals:   03/27/15 2330 03/27/15 2345 03/28/15 0000 03/28/15 0030  BP: 139/89 135/84 132/84 118/69  Pulse: 99 95 90 92  Temp:      TempSrc:  Resp:      SpO2: 98% 98% 99% 100%      Visual Acuity  Right Eye Distance:   Left Eye Distance:   Bilateral Distance:    Right Eye Near:  20/20 Left Eye Near:   20/25 Bilateral Near:   20/25  MDM   Meds given in ED:  Medications  sodium chloride 0.9 % bolus 1,000 mL (1,000 mLs Intravenous New Bag/Given 03/28/15 0017)  fluorescein ophthalmic strip 1 strip (1 strip Both Eyes Given 03/28/15 0001)  tetracaine (PONTOCAINE) 0.5 % ophthalmic solution 1 drop (1 drop Both Eyes Given 03/28/15 0001)    New Prescriptions   No medications on file    Final diagnoses:  Left eye pain  Hyperglycemia   This is a 30 y.o. female with a history of diabetes who presents to the ED complaining of intermittent left eye pain and pressure for the past two days.  She currently complains of left eye pain and some blurry vision intermittently. She reports some double vision intermittently that is resolved. She reports aspirin is been helping her symptoms. She reports the symptoms can occur when she is at rest or with activity. She is unable  to identify alleviating or aggravating factors of her left eye pain. She does not wear contacts or glasses. She denies any foreign body sensation.  She denies having any headaches with her left eye pain. She reports her blood sugars have been more elevated recently. She also reports sensitivity to the light. The patient denies fevers, chills, headache.   On exam the patient is afebrile nontoxic appearing. She has no focal neurological deficits.  No conjunctival injection. Fluorescein stain is unremarkable. Patient has normal intraocular pressures with Tono-Pen.  Visual acuity is essentially normal.After patient received tetracaine she reports relief of her pain. With pain relief after tetracaine this is unlikely that this is a neurological issue. Patient also seems to have wavering symptoms that are inconsistent and change. She denied pain just before my exam and then had pain again during my exam. I have a low suspicion for intracerebral involvement.   while in the waiting room patient ate [employer] hamburger and french fries. Her blood sugar is elevated on arrival back to her room. Patient gave herself insulin when she came back to the emergency department room. Her repeat sugar has improved. I discussed diabetes diet and I encouraged her to work on her sugar levels.   we will have the patient follow-up with ophthalmology and with primary care. I discussed strict and specific return precautions with the patient. I advised the patient to follow-up with their primary care provider this week. I advised the patient to return to the emergency department with new or worsening symptoms or new concerns. The patient verbalized understanding and agreement with plan.    This patient was discussed with Dr. Clarene Duke who agrees with assessment and plan.    Everlene Farrier, PA-C 03/28/15 0131  Laurence Spates, MD 04/01/15 [phone]

## 2015-03-27 NOTE — Telephone Encounter (Signed)
Pt returned call and stated that she has been taking aspirin to help with pain but that her eyes are still very red and small and she is going to either the ED or the Urgent Care.  PT is also requesting a refill on her accucheck strips.Forwarding to PCP. Lamonte SakaiZimmerman Rumple, April D, [place]

## 2015-03-27 NOTE — ED Notes (Addendum)
She c/o several day history of intermittent L eye pain and pressure and blurred vision. She is not having the symptoms now. She denies any injuries. She denies head pain/headache. She denies any activity at onset of symptoms, often the episodes occur while she is at rest. She has been taking aspirin with some relief of the symptoms. seh spoke to her PCP about her symptoms and he advised her to go to ED for further evaluation and was concerned about her HgbA1c being elevated on this weeks check, states she has been out of test strips to check her blood sugar at home but she has been taking her diabetes medications as directed. She is A&Ox4, resp e/u.

## 2015-03-28 LAB — CBG MONITORING, ED: GLUCOSE-CAPILLARY: 268 mg/dL — AB (ref 65–99)

## 2015-03-28 MED ORDER — ACCU-CHEK FASTCLIX LANCETS MISC: Status: DC

## 2015-03-28 NOTE — ED Notes (Signed)
Pt stable, ambulatory, states understanding of discharge instructions 

## 2015-03-28 NOTE — Discharge Instructions (Signed)
Blood Glucose Monitoring, Adult Monitoring your blood glucose (also know as blood sugar) helps you to manage your diabetes. It also helps you and your health care provider monitor your diabetes and determine how well your treatment plan is working. WHY SHOULD YOU MONITOR YOUR BLOOD GLUCOSE?  It can help you understand how food, exercise, and medicine affect your blood glucose.  It allows you to know what your blood glucose is at any given moment. You can quickly tell if you are having low blood glucose (hypoglycemia) or high blood glucose (hyperglycemia).  It can help you and your health care provider know how to adjust your medicines.  It can help you understand how to manage an illness or adjust medicine for exercise. WHEN SHOULD YOU TEST? Your health care provider will help you decide how often you should check your blood glucose. This may depend on the type of diabetes you have, your diabetes control, or the types of medicines you are taking. Be sure to write down all of your blood glucose readings so that this information can be reviewed with your health care provider. See below for examples of testing times that your health care provider may suggest. Type 1 Diabetes  Test at least 2 times per day if your diabetes is well controlled, if you are using an insulin pump, or if you perform multiple daily injections.  If your diabetes is not well controlled or if you are sick, you may need to test more often.  It is a good idea to also test:  Before every insulin injection.  Before and after exercise.  Between meals and 2 hours after a meal.  Occasionally between 2:00 a.m. and 3:00 a.m. Type 2 Diabetes  If you are taking insulin, test at least 2 times per day. However, it is best to test before every insulin injection.  If you take medicines by mouth (orally), test 2 times a day.  If you are on a controlled diet, test once a day.  If your diabetes is not well controlled or if you  are sick, you may need to monitor more often. HOW TO MONITOR YOUR BLOOD GLUCOSE Supplies Needed  Blood glucose meter.  Test strips for your meter. Each meter has its own strips. You must use the strips that go with your own meter.  A pricking needle (lancet).  A device that holds the lancet (lancing device).  A journal or log book to write down your results. Procedure  Wash your hands with soap and water. Alcohol is not preferred.  Prick the side of your finger (not the tip) with the lancet.  Gently milk the finger until a small drop of blood appears.  Follow the instructions that come with your meter for inserting the test strip, applying blood to the strip, and using your blood glucose meter. Other Areas to Get Blood for Testing Some meters allow you to use other areas of your body (other than your finger) to test your blood. These areas are called alternative sites. The most common alternative sites are:  The forearm.  The thigh.  The back area of the lower leg.  The palm of the hand. The blood flow in these areas is slower. Therefore, the blood glucose values you get may be delayed, and the numbers are different from what you would get from your fingers. Do not use alternative sites if you think you are having hypoglycemia. Your reading will not be accurate. Always use a finger if you are  having hypoglycemia. Also, if you cannot feel your lows (hypoglycemia unawareness), always use your fingers for your blood glucose checks. ADDITIONAL TIPS FOR GLUCOSE MONITORING  Do not reuse lancets.  Always carry your supplies with you.  All blood glucose meters have a 24-hour "hotline" number to call if you have questions or need help.  Adjust (calibrate) your blood glucose meter with a control solution after finishing a few boxes of strips. BLOOD GLUCOSE RECORD KEEPING It is a good idea to keep a daily record or log of your blood glucose readings. Most glucose meters, if not all,  keep your glucose records stored in the meter. Some meters come with the ability to download your records to your home computer. Keeping a record of your blood glucose readings is especially helpful if you are wanting to look for patterns. Make notes to go along with the blood glucose readings because you might forget what happened at that exact time. Keeping good records helps you and your health care provider to work together to achieve good diabetes management.    This information is not intended to replace advice given to you by your health care provider. Make sure you discuss any questions you have with your health care provider.   Document Released: 12/31/2002 Document Revised: 01/18/2014 Document Reviewed: 05/22/2012 Elsevier Interactive Patient Education 2016 [employer] Blurred Vision Having blurred vision means that you cannot see things clearly. Your vision may seem fuzzy or out of focus. Blurred vision is a very common symptom of an eye or vision problem. Blurred vision is often a gradual blur that occurs in one eye or both eyes. There are many causes of blurred vision, including cataracts, macular degeneration, and diabetic retinopathy. Blurred vision can be diagnosed based on your symptoms and a physical exam. Tell your health care provider about any other health problems you have, any recent eye injury, and any prior surgeries. You may need to see a health care provider who specializes in eye problems (ophthalmologist). Your treatment depends on what is causing your blurred vision.  HOME CARE INSTRUCTIONS  Tell your health care provider about any changes in your blurred vision.  Do not drive or operate heavy machinery if your vision is blurry.  Keep all follow-up visits as directed by your health care provider. This is important. SEEK MEDICAL CARE IF:  Your symptoms get worse.  You have new symptoms.  You have trouble seeing at night.  You have trouble seeing up close or far  away.  You have trouble noticing the difference between colors. SEEK IMMEDIATE MEDICAL CARE IF:  You have severe eye pain.  You have a severe headache.  You have flashing lights in your field of vision.  You have a sudden change in vision.  You have a sudden loss of vision.  You have vision change after an injury.  You notice drainage coming from your eyes.  You notice a rash around your eyes.   This information is not intended to replace advice given to you by your health care provider. Make sure you discuss any questions you have with your health care provider.   Document Released: 12/31/2002 Document Revised: 05/14/2014 Document Reviewed: 11/21/2013 Elsevier Interactive Patient Education [employer] Inc[employer]

## 2015-04-03 ENCOUNTER — Ambulatory Visit: Payer: Medicaid Other | Admitting: Family Medicine

## 2015-04-07 ENCOUNTER — Ambulatory Visit (INDEPENDENT_AMBULATORY_CARE_PROVIDER_SITE_OTHER): Payer: Medicaid Other | Admitting: Family Medicine

## 2015-04-07 ENCOUNTER — Encounter: Payer: Self-pay | Admitting: Family Medicine

## 2015-04-07 ENCOUNTER — Ambulatory Visit (HOSPITAL_COMMUNITY)
Admission: RE | Admit: 2015-04-07 | Discharge: 2015-04-07 | Disposition: A | Discharge status: Home / Self Care | Payer: Medicaid Other | Source: Ambulatory Visit | Attending: Family Medicine | Admitting: Family Medicine

## 2015-04-07 VITALS — BP 152/87 | HR 87 | Temp 98.2°F | Wt 236.7 lb

## 2015-04-07 DIAGNOSIS — I10 Essential (primary) hypertension: Secondary | ICD-10-CM | POA: Diagnosis not present

## 2015-04-07 DIAGNOSIS — B373 Candidiasis of vulva and vagina: Secondary | ICD-10-CM | POA: Diagnosis not present

## 2015-04-07 DIAGNOSIS — E118 Type 2 diabetes mellitus with unspecified complications: Secondary | ICD-10-CM | POA: Diagnosis not present

## 2015-04-07 DIAGNOSIS — R9431 Abnormal electrocardiogram [ECG] [EKG]: Secondary | ICD-10-CM | POA: Diagnosis not present

## 2015-04-07 DIAGNOSIS — R51 Headache: Secondary | ICD-10-CM

## 2015-04-07 DIAGNOSIS — R55 Syncope and collapse: Secondary | ICD-10-CM

## 2015-04-07 DIAGNOSIS — E1140 Type 2 diabetes mellitus with diabetic neuropathy, unspecified: Secondary | ICD-10-CM | POA: Diagnosis not present

## 2015-04-07 DIAGNOSIS — E1165 Type 2 diabetes mellitus with hyperglycemia: Secondary | ICD-10-CM

## 2015-04-07 DIAGNOSIS — R519 Headache, unspecified: Secondary | ICD-10-CM

## 2015-04-07 LAB — POCT URINALYSIS DIPSTICK
Bilirubin, UA: NEGATIVE
GLUCOSE UA: 500
Ketones, UA: 15
Leukocytes, UA: NEGATIVE
NITRITE UA: NEGATIVE
PH UA: 7
PROTEIN UA: 100
SPEC GRAV UA: 1.02
UROBILINOGEN UA: 1

## 2015-04-07 LAB — GLUCOSE, CAPILLARY: Glucose-Capillary: 279 mg/dL — ABNORMAL HIGH (ref 65–99)

## 2015-04-07 NOTE — Progress Notes (Signed)
Subjective:    Patient ID: Karina [name] , female   DOB: 07-23-1985 , 30 y.o..   MRN: [id number]  HPI  Karina [name] is here as a walk in appointment for concerns about her blood sugar, headache, stomach ache, nausea, and "blacking out".  Patient states around 8:45AM she developed a headache, stomach ache, and nausea before going to court. She drove to a court case (apparently there is a court case about custody of her child) and was at court during most of the day. Throughout the day the headache become worse. She tried drinking water and eating cheetos but that did not resolve her symptoms. Did not try any medications. About 45 minutes prior to presentation to clinic, patient was sitting in her car with her mother when patient said she was unresponsive for a minute. Patient states her mother said that her eyes were open but she was not responding to any questions. Patient does not recall this minute of unresponsiveness. Denied any tongue biting, incontinence, weakness, or drowsiness after the episode. Admits she has been under a lot of stress recently with the court cases for the custody of her child.   In terms of her diabetes, admits she frequently forgets to give herself insulin and does not consistently check her blood sugar. Has not had any insulin today and has not checked her glucose. Admits to polydipsia. Denies any hypoglycemic episodes. She is supposed to be taking Novolog 25 units with meals and Levemir 50 U BID.  Denies fever/chills, vomiting, diarrhea, constipation, polyuria, numbness/tingling, blurry vision, weakness, palpitations, chest pain, shortness of breath, or gait instability.   Review of Systems: Per HPI. All other systems reviewed and are negative.  Past Medical History: Patient Active Problem List   Diagnosis Date Noted  . Irregular menstrual cycle 07/04/2014  . Essential hypertension, benign 12/12/2013  . Morbid obesity (HCC) 05/25/2013  . Moderate  nonproliferative diabetic retinopathy (HCC) 05/24/2013  . Diabetic neuropathy, type II diabetes mellitus (HCC) 05/23/2013  . Toenail fungus 01/17/2013  . Type II diabetes mellitus with complication, uncontrolled (HCC) 12/27/2012  . Depression 12/27/2012    Medications: reviewed and updated  Social Hx:   reports that she has never smoked. She has never used smokeless tobacco.    Objective:   BP 152/87 mmHg  Pulse 87  Temp(Src) 98.2 F (36.8 C) (Oral)  Wt 236 lb 11.2 oz (107.366 kg)  LMP 03/25/2015 Physical Exam  Gen: NAD, alert, cooperative with exam, well-appearing, obese HEENT: NCAT, PERRL, clear conjunctiva, oropharynx clear, supple neck CV: RRR, good S1/S2, no murmur, no edema, capillary refill brisk  Resp: CTABL, no wheezes, non-labored Abd: SNTND, BS present, no guarding or organomegaly Skin: normal turgor  Neuro: no gross deficits. No slurred speech Psych: good insight, alert and oriented  EKG: normal EKG, normal sinus rhythm.     Assessment & Plan:  Type II diabetes mellitus with complication, uncontrolled (HCC) Uncontrolled. Non-compliant with insulin and infrequently checks glucose. Does not follow diabetic diet. Physical exam showed no Kussmaul breathing or signs of dehydration. Glucose 279 and UA showed 15 ketones today, no concerns for DKA at this time.  - Discussed with patient the dangers of uncontrolled diabetes. Patient expressed good understanding and is aware that her non-compliance is negatively affecting her health.  - Handout for diabetic diet provided - Continue current medication regimen - Follow up with PCP in 1-2 weeks  Syncope Questionable history of syncopal episode that lasted ~1 minute while sitting in a  parked car. Preceded by headache, stomach ache, and nausea. Likely not a true syncopal episode as patient states her eyes were open but she felt like she "blacked out". Reaction could be due to stress from court case today. Other differentials  include neurogenic vs cardiogenic causes. Unlikely seizure as patient has no hx of seizures, mother apparently did not witness any convulsing/twitching/tongue biting/incontinence, and she did not have a post-ictal state.  Unlikely arrhythmia as EKG revealed NSR with no ST changes.  - Return precautions given - Patient instructed to go to ED for episodes of blacking out, shortness of breath, chest pain, or palpitations  - Tylenol PRN for further headaches and Tums PRN for dyspepsia

## 2015-04-07 NOTE — Patient Instructions (Addendum)
Your EKG of your heart is normal and there are no signs of diabetic ketoacidosis. Please take your diabetes medications and schedule an appointment with your PCP in 1-2 weeks.    For your  Headache, you can take Tylenol as needed For your upset stomach, you can try Tums or Pepto bismol.   If you have any episodes of blacking out, shortness of breath, chest pain, or palpitations please go to the emergency department.   If you have any questions or concerns, please do not hesitate to call the office at [phone].  Sincerely,  Anders Simmondshristina Gambino, MD   Diet Recommendations for Diabetes   Starchy (carb) foods include: Bread, rice, pasta, potatoes, corn, crackers, bagels, muffins, all baked goods.   Protein foods include: Meat, fish, poultry, eggs, dairy foods, and beans such as pinto and kidney beans (beans also provide carbohydrate).   1. Eat at least 3 meals and 1-2 snacks per day. Never go more than 4-5 hours while awake without eating.  2. Limit starchy foods to TWO per meal and ONE per snack. ONE portion of a starchy  food is equal to the following:   - ONE slice of bread (or its equivalent, such as half of a hamburger bun).   - 1/2 cup of a "scoopable" starchy food such as potatoes or rice.   - 1 OUNCE (28 grams) of starchy snack foods such as crackers or pretzels (look on label).   - 15 grams of carbohydrate as shown on food label.  3. Both lunch and dinner should include a protein food, a carb food, and vegetables.   - Obtain twice as many veg's as protein or carbohydrate foods for both lunch and dinner.   - Try to keep frozen veg's on hand for a quick vegetable serving.     - Fresh or frozen veg's are best.  4. Breakfast should always include protein.

## 2015-04-08 DIAGNOSIS — R55 Syncope and collapse: Secondary | ICD-10-CM | POA: Insufficient documentation

## 2015-04-08 HISTORY — DX: Syncope and collapse: R55

## 2015-04-08 NOTE — Assessment & Plan Note (Addendum)
Uncontrolled. Non-compliant with insulin and infrequently checks glucose. Does not follow diabetic diet. Physical exam showed no Kussmaul breathing or signs of dehydration. Glucose 279 and UA showed 15 ketones today, no concerns for DKA at this time.  - Discussed with patient the dangers of uncontrolled diabetes. Patient expressed good understanding and is aware that her non-compliance is negatively affecting her health.  - Handout for diabetic diet provided - Continue current medication regimen - Follow up with PCP in 1-2 weeks

## 2015-04-08 NOTE — Assessment & Plan Note (Addendum)
Questionable history of syncopal episode that lasted ~1 minute while sitting in a parked car. Preceded by headache, stomach ache, and nausea. Likely not a true syncopal episode as patient states her eyes were open but she felt like she "blacked out". Reaction could be due to stress from court case today. Other differentials include neurogenic vs cardiogenic causes. Unlikely seizure as patient has no hx of seizures, mother apparently did not witness any convulsing/twitching/tongue biting/incontinence, and she did not have a post-ictal state.  Unlikely arrhythmia as EKG revealed NSR with no ST changes.  - Return precautions given - Patient instructed to go to ED for episodes of blacking out, shortness of breath, chest pain, or palpitations  - Tylenol PRN for further headaches and Tums PRN for dyspepsia

## 2015-04-11 ENCOUNTER — Ambulatory Visit (INDEPENDENT_AMBULATORY_CARE_PROVIDER_SITE_OTHER): Payer: Medicaid Other | Admitting: Family Medicine

## 2015-04-11 ENCOUNTER — Encounter: Payer: Self-pay | Admitting: Family Medicine

## 2015-04-11 VITALS — BP 130/71 | HR 94 | Temp 98.4°F | Ht 63.0 in | Wt 237.2 lb

## 2015-04-11 DIAGNOSIS — E118 Type 2 diabetes mellitus with unspecified complications: Secondary | ICD-10-CM | POA: Diagnosis not present

## 2015-04-11 DIAGNOSIS — B373 Candidiasis of vulva and vagina: Secondary | ICD-10-CM | POA: Diagnosis not present

## 2015-04-11 DIAGNOSIS — I10 Essential (primary) hypertension: Secondary | ICD-10-CM | POA: Diagnosis not present

## 2015-04-11 DIAGNOSIS — E1165 Type 2 diabetes mellitus with hyperglycemia: Secondary | ICD-10-CM

## 2015-04-11 DIAGNOSIS — E1140 Type 2 diabetes mellitus with diabetic neuropathy, unspecified: Secondary | ICD-10-CM | POA: Diagnosis not present

## 2015-04-11 MED ORDER — ACCU-CHEK AVIVA CONNECT W/DEVICE KIT: 50.0000 [IU] | PACK | Freq: Two times a day (BID) | Status: DC

## 2015-04-11 NOTE — Progress Notes (Signed)
   Subjective:    Karina MoloneyMarquise P [name] - 30 y.o. female MRN [id number]  Date of birth: 22-Jan-1985  CC Dm2  HPI  Karina [name] is here for Dm2.  CHRONIC DIABETES She needs a refill of levemir  Disease Monitoring  Blood Sugar Ranges: none  Polyuria: no   Visual problems: no   Medication Compliance: yes  Medication Side Effects  Hypoglycemia: no   Preventitive Health Care  Eye Exam: has appt with ophtho Tuesday.   Diet pattern: has stopped eating bread and drinking water.   Exercise: trying to exercise 15 -20 minutes per day. She was prescribed phenteramine for weight loss but hasn't picked it up yet.   SH: no tobacco or EtOH  PMH: DM2, hx of HTN  Health Maintenance:  - has an appt with ophtho.  Health Maintenance Due  Topic Date Due  . PNEUMOCOCCAL POLYSACCHARIDE VACCINE (1) 07/31/1985  . OPHTHALMOLOGY EXAM  05/24/2014  . URINE MICROALBUMIN  05/31/2014    Review of Systems See HPI     Objective:   Physical Exam BP 130/71 mmHg  Pulse 94  Temp(Src) 98.4 F (36.9 C) (Oral)  Ht 5\' 3"  (1.6 m)  Wt 237 lb 3.2 oz (107.593 kg)  BMI 42.03 kg/m2  LMP 03/25/2015 Gen: NAD, alert, cooperative with exam,  CV: RRR, good S1/S2, no murmur, no edema,   Resp: CTABL, no wheezes, non-labored Skin: no rashes, normal turgor  Neuro: no gross deficits.  Psych: alert and oriented    Assessment & Plan:   Type II diabetes mellitus with complication, uncontrolled (HCC) Uncontrolled  - ordered a BS monitor  - she should have plenty of levemir  - has follow up with ophtho scheduled.  - follow up in two months

## 2015-04-11 NOTE — Patient Instructions (Signed)
Thank you for coming in,   Please check your blood sugar in the morning before you eat and bring me the device next time in clinic.   Please bring all of your medications with you to each visit.   Sign up for My Chart to have easy access to your labs results, and communication with your Primary care physician   Please feel free to call with any questions or concerns at any time, at [phone]. --Dr. Jordan LikesSchmitz  Diet Recommendations for Diabetes   Starchy (carb) foods include: Bread, rice, pasta, potatoes, corn, crackers, bagels, muffins, all baked goods.   Protein foods include: Meat, fish, poultry, eggs, dairy foods, and beans such as pinto and kidney beans (beans also provide carbohydrate).   1. Eat at least 3 meals and 1-2 snacks per day. Never go more than 4-5 hours while awake without eating.  2. Limit starchy foods to TWO per meal and ONE per snack. ONE portion of a starchy  food is equal to the following:   - ONE slice of bread (or its equivalent, such as half of a hamburger bun).   - 1/2 cup of a "scoopable" starchy food such as potatoes or rice.   - 1 OUNCE (28 grams) of starchy snack foods such as crackers or pretzels (look on label).   - 15 grams of carbohydrate as shown on food label.  3. Both lunch and dinner should include a protein food, a carb food, and vegetables.   - Obtain twice as many veg's as protein or carbohydrate foods for both lunch and dinner.   - Try to keep frozen veg's on hand for a quick vegetable serving.     - Fresh or frozen veg's are best.  4. Breakfast should always include protein.

## 2015-04-11 NOTE — Assessment & Plan Note (Addendum)
Uncontrolled  - ordered a BS monitor  - she should have plenty of levemir  - has follow up with ophtho scheduled.  - follow up in two months

## 2015-04-17 ENCOUNTER — Other Ambulatory Visit: Payer: Self-pay | Admitting: Family Medicine

## 2015-04-18 NOTE — Telephone Encounter (Signed)
This is your patient, see med refill req 

## 2015-04-23 ENCOUNTER — Other Ambulatory Visit: Payer: Self-pay | Admitting: *Deleted

## 2015-04-23 DIAGNOSIS — E118 Type 2 diabetes mellitus with unspecified complications: Principal | ICD-10-CM

## 2015-04-23 DIAGNOSIS — E1165 Type 2 diabetes mellitus with hyperglycemia: Secondary | ICD-10-CM

## 2015-04-23 MED ORDER — INSULIN PEN NEEDLE 31G X 8 MM MISC: 1.0000 | Status: DC | PRN

## 2015-05-30 ENCOUNTER — Ambulatory Visit (INDEPENDENT_AMBULATORY_CARE_PROVIDER_SITE_OTHER): Payer: Medicaid Other | Admitting: Family Medicine

## 2015-05-30 ENCOUNTER — Encounter: Payer: Self-pay | Admitting: Family Medicine

## 2015-05-30 VITALS — BP 149/87 | HR 98 | Temp 98.5°F | Wt 231.0 lb

## 2015-05-30 DIAGNOSIS — E118 Type 2 diabetes mellitus with unspecified complications: Secondary | ICD-10-CM | POA: Diagnosis not present

## 2015-05-30 DIAGNOSIS — M79604 Pain in right leg: Secondary | ICD-10-CM

## 2015-05-30 DIAGNOSIS — E1140 Type 2 diabetes mellitus with diabetic neuropathy, unspecified: Secondary | ICD-10-CM | POA: Diagnosis not present

## 2015-05-30 DIAGNOSIS — B373 Candidiasis of vulva and vagina: Secondary | ICD-10-CM | POA: Diagnosis not present

## 2015-05-30 DIAGNOSIS — I10 Essential (primary) hypertension: Secondary | ICD-10-CM | POA: Diagnosis not present

## 2015-05-30 MED ORDER — DICLOFENAC SODIUM 75 MG PO TBEC: 75.0000 mg | DELAYED_RELEASE_TABLET | Freq: Two times a day (BID) | ORAL | Status: DC | PRN

## 2015-05-30 NOTE — Progress Notes (Signed)
    Subjective   Tanishi P Holli Humblesyeni is a 30 y.o. female that presents for a same day visit  1. Right leg pain: Symptoms started yesterday after she slammed her shin on to the bar of a shopping cart. She had significant pain at that time. She has burning and throbbing pain. She has tried icy/hot which did not help. She has not tried anything because she is diabetic.   ROS Per HPI  Social History  Substance Use Topics  . Smoking status: Never Smoker   . Smokeless tobacco: Never Used  . Alcohol Use: No    No Known Allergies  Objective   BP 149/87 mmHg  Pulse 98  Temp(Src) 98.5 F (36.9 C) (Oral)  Wt 231 lb (104.781 kg)  General: Well appearing, no distress Musculoskeletal: Right leg: Tenderness along medial aspect of tibia. No swelling or ecchymossis. Skin: Tenderness with light touch over medial aspect of right leg  Assessment and Plan   1. Right leg pain Patient with minimal pain on exam. Favor possible injury to superficial nerve causing some paresthesia. Expect this will get better over time. Will provide NSAIDs initially. May benefit from topical cream, like capsaicin. - diclofenac (VOLTAREN) 75 MG EC tablet; Take 1 tablet (75 mg total) by mouth 2 (two) times daily as needed.  Dispense: 20 tablet; Refill: 0

## 2015-05-30 NOTE — Patient Instructions (Signed)
Thank you for coming to see me today. It was a pleasure. Today we talked about:   It looks like you may have bruised your leg. I am prescribing some pain medication to help with your pain. Please also ice, rest and elevate your leg for at least 20 minutes multiple times per day. If symptoms worsen, please return for reevaluation. The burning is likely caused by injury/irritation to the nerve in that area. This will hopefully improve in time.   Please make an appointment to see Dr. Jordan LikesSchmitz for follow-up.  If you have any questions or concerns, please do not hesitate to call the office at [phone].  Sincerely,  Jacquelin Hawkingalph Dana Dorner, MD

## 2015-06-05 ENCOUNTER — Ambulatory Visit (INDEPENDENT_AMBULATORY_CARE_PROVIDER_SITE_OTHER): Payer: Medicaid Other | Admitting: Family Medicine

## 2015-06-05 ENCOUNTER — Ambulatory Visit: Payer: Medicaid Other | Admitting: Family Medicine

## 2015-06-05 VITALS — BP 124/62 | HR 110 | Temp 98.6°F | Wt 234.2 lb

## 2015-06-05 DIAGNOSIS — B373 Candidiasis of vulva and vagina: Secondary | ICD-10-CM | POA: Diagnosis not present

## 2015-06-05 DIAGNOSIS — E118 Type 2 diabetes mellitus with unspecified complications: Secondary | ICD-10-CM | POA: Diagnosis not present

## 2015-06-05 DIAGNOSIS — I10 Essential (primary) hypertension: Secondary | ICD-10-CM | POA: Diagnosis not present

## 2015-06-05 DIAGNOSIS — M79604 Pain in right leg: Secondary | ICD-10-CM | POA: Insufficient documentation

## 2015-06-05 DIAGNOSIS — L0292 Furuncle, unspecified: Secondary | ICD-10-CM | POA: Diagnosis not present

## 2015-06-05 DIAGNOSIS — E1140 Type 2 diabetes mellitus with diabetic neuropathy, unspecified: Secondary | ICD-10-CM | POA: Diagnosis not present

## 2015-06-05 NOTE — Progress Notes (Signed)
   Subjective:    Karina [name] - 30 y.o. female MRN [id number]  Date of birth: 1985-05-03  CC right lower leg pain   HPI  Karina [name] is here for right lower leg pain.   Her chart when into a hole at [employer] and her leg slammed into the bar on the cart.  She is still having pain.  Pain is 8/10 and pulsating.  Worse with activity.  Has tried ibuprofen and she did not take the voltaren.  Pain has gotten some better.  There is no different in the sensation of pain when something touches it.  This seems to cycle it. .  Boil:  She gets boils from time to time under her pannus.  The most recent one started about a week ago and then starting draining spontaneously.  Denies any boils anywhere else.  Has been keeping it clean.  There is no pain or fevers.   Health Maintenance:  Health Maintenance Due  Topic Date Due  . PNEUMOCOCCAL POLYSACCHARIDE VACCINE (1) 07/31/1985  . OPHTHALMOLOGY EXAM  05/24/2014  . URINE MICROALBUMIN  05/31/2014    Review of Systems See HPI     Objective:   Physical Exam BP 124/62 mmHg  Pulse 110  Temp(Src) 98.6 F (37 C) (Oral)  Wt 234 lb 3.2 oz (106.232 kg)  LMP 05/30/2015 Gen: NAD, alert, cooperative with exam Resp: CTABL, no wheezes, non-labored Skin: small raised indurated lesion under her pannus. No streaking or pain with palpation, no lesions occuring anywhere else.  Neuro: no gross deficits.  MSK: area of tenderness in her right mid tibia. No ecchymosis or redness. Normal plantar and dorsiflexion,     Assessment & Plan:   Right leg pain Limited US showing some fluid collection just above the bone on the mid tibia.  Most likely this is a bone contusion.  - will get tib/fib x-ray to rule out fracture.  - advised compression, ice and capsaicin cream with lidocaine.   Boil No area of fluctuance to drain Doesn't appear infected to need antibiotics.  - advised warm compress - encouraged weight loss and control of her diabetes  to keep this from re-occuring.

## 2015-06-05 NOTE — Patient Instructions (Signed)
Thank you for coming in,   You can apply capsaicin cream with lidocaine to the area of pain  Wearing compression of this area will help with the pain is well  Please bring all of your medications with you to each visit.   Health maintenance items that are due.  Health Maintenance  Topic Date Due  . Pneumococcal vaccine (1) 07/31/1985  . Eye exam for diabetics  05/24/2014  . Urine Protein Check  05/31/2014  . Flu Shot  08/12/2015  . Hemoglobin A1C  09/21/2015  . Complete foot exam   03/20/2016  . Pap Smear  07/03/2017  . Tetanus Vaccine  03/14/2022  . HIV Screening  Completed     Sign up for My Chart to have easy access to your labs results, and communication with your Primary care physician   Please feel free to call with any questions or concerns at any time, at [phone]. --Dr. Jordan LikesSchmitz

## 2015-06-06 DIAGNOSIS — L0292 Furuncle, unspecified: Secondary | ICD-10-CM | POA: Insufficient documentation

## 2015-06-06 NOTE — Assessment & Plan Note (Addendum)
No area of fluctuance to drain Doesn't appear infected to need antibiotics.  - advised warm compress - encouraged weight loss and control of her diabetes to keep this from re-occuring.

## 2015-06-06 NOTE — Assessment & Plan Note (Signed)
Limited US showing some fluid collection just above the bone on the mid tibia.  Most likely this is a bone contusion.  - will get tib/fib x-ray to rule out fracture.  - advised compression, ice and capsaicin cream with lidocaine.

## 2015-06-17 ENCOUNTER — Other Ambulatory Visit: Payer: Self-pay | Admitting: Family Medicine

## 2015-06-17 NOTE — Telephone Encounter (Signed)
This is your patient, for refill

## 2015-07-01 ENCOUNTER — Encounter: Payer: Self-pay | Admitting: Family Medicine

## 2015-07-01 ENCOUNTER — Ambulatory Visit
Admission: RE | Admit: 2015-07-01 | Discharge: 2015-07-01 | Disposition: A | Discharge status: Home / Self Care | Payer: Medicaid Other | Source: Ambulatory Visit | Attending: Family Medicine | Admitting: Family Medicine

## 2015-07-01 ENCOUNTER — Telehealth: Payer: Self-pay | Admitting: Family Medicine

## 2015-07-01 DIAGNOSIS — M79604 Pain in right leg: Secondary | ICD-10-CM

## 2015-07-01 NOTE — Telephone Encounter (Signed)
Spoke with patient about her x-ray results.   Karina RudeJeremy E Helene Bernstein, MD PGY-3, [facility] 07/01/2015, 2:31 PM

## 2015-07-03 ENCOUNTER — Other Ambulatory Visit: Payer: Self-pay | Admitting: Family Medicine

## 2015-07-09 ENCOUNTER — Telehealth: Payer: Self-pay | Admitting: Family Medicine

## 2015-07-09 NOTE — Telephone Encounter (Signed)
Patient requests copy of the pictures from ultrasound an discharge papers from May 25 appointment. Please, follow up.

## 2015-07-09 NOTE — Telephone Encounter (Signed)
Patient asks PCP to write a report about my last few appointments concerning her lower right leg. Please, follow up.

## 2015-07-10 NOTE — Telephone Encounter (Signed)
Left VM for patient. If her calls back please have her speak with a nurse/CMA and inform that the ultrasound was not an official radiology order to have a report on.   If any questions then please take the best time and phone number to call and I will try to call her back.   Myra RudeJeremy E Abundio Teuscher, MD PGY-3, [facility] 07/10/2015, 8:32 AM

## 2015-07-10 NOTE — Telephone Encounter (Signed)
Left VM for patient. If she calls back please have her speak with a nurse/CMA and what kind of report is she requesting. She should be able to ask for the clinic notes from her visit.   If any questions then please take the best time and phone number to call and I will try to call her back.   Karina RudeJeremy E [name], MD PGY-3, [facility] 07/10/2015, 8:33 AM

## 2015-07-14 NOTE — Telephone Encounter (Signed)
Pt is returning the doctor's call. Please call back. jw °

## 2015-07-16 NOTE — Telephone Encounter (Signed)
Informed patient that i can print her last clinic note and that it would include the U/S reading made by provider on this date.  She voiced understanding that images are not saved to patient's chart when ultrasound performed here.  Note from 05-16-15 printed and placed up front for pick up. Taleigha Pinson,CMA

## 2015-07-17 ENCOUNTER — Other Ambulatory Visit: Payer: Self-pay | Admitting: Family Medicine

## 2015-07-17 MED ORDER — "INSULIN SYRINGE-NEEDLE U-100 31G X 5/16"" 1 ML MISC": Status: DC

## 2015-09-11 ENCOUNTER — Encounter: Payer: Self-pay | Admitting: Student

## 2015-09-11 ENCOUNTER — Ambulatory Visit (INDEPENDENT_AMBULATORY_CARE_PROVIDER_SITE_OTHER): Payer: Medicaid Other | Admitting: Student

## 2015-09-11 DIAGNOSIS — M5442 Lumbago with sciatica, left side: Secondary | ICD-10-CM

## 2015-09-11 DIAGNOSIS — M5441 Lumbago with sciatica, right side: Secondary | ICD-10-CM | POA: Diagnosis not present

## 2015-09-11 DIAGNOSIS — M549 Dorsalgia, unspecified: Secondary | ICD-10-CM | POA: Insufficient documentation

## 2015-09-11 HISTORY — DX: Dorsalgia, unspecified: M54.9

## 2015-09-11 NOTE — Assessment & Plan Note (Signed)
Low back pain with radiation to extremities suggestive for radiculopathic pain. Her work might have some contribution to her pain as she repeatedly lifts about 35-40 pounds. No red flags. Neuro exam reassuring. Strongly recommended lifestyle changes to lose weight. Recommended continuing ibuprofen and Voltaren gel as needed for pain. Recommended stretching before her work. Also gave handout on back exercises for low back pain. Can consider physical therapy if no improvement with these measures.

## 2015-09-11 NOTE — Patient Instructions (Addendum)
It was great seeing you today! We have addressed the following issues today  1. Back pain: I have very low suspicion for something serious. This is probably a muscle pain. Weight might have some contribution to eat. Strongly recommend losing weight by lifestyle changes (diet and exercise). I also recommend back exercises (see below for details). Continue using ibuprofen, Tylenol and using Voltaren gel as needed.     If we did any lab work today, and the results require attention, either me or my nurse will get in touch with you. If everything is normal, you will get a letter in mail. If you don't hear from [place] in two weeks, please give [place] a call. Otherwise, I look forward to talking with you again at our next visit. If you have any questions or concerns before then, please call the clinic at [phone].  Please bring all your medications to every doctors visit   Sign up for My Chart to have easy access to your labs results, and communication with your Primary care physician.    Please check-out at the front desk before leaving the clinic.   Take Care,    Back Injury Prevention Back injuries can be very painful. They can also be difficult to heal. After having one back injury, you are more likely to injure your back again. It is important to learn how to avoid injuring or re-injuring your back. The following tips can help you to prevent a back injury. WHAT SHOULD I KNOW ABOUT PHYSICAL FITNESS?  Exercise for 30 minutes per day on most days of the week or as told by your doctor. Make sure to:  Do aerobic exercises, such as walking, jogging, biking, or swimming.  Do exercises that increase balance and strength, such as tai chi and yoga.  Do stretching exercises. This helps with flexibility.  Try to develop strong belly (abdominal) muscles. Your belly muscles help to support your back.  Stay at a healthy weight. This helps to decrease your risk of a back injury. WHAT SHOULD I KNOW  ABOUT MY DIET?  Talk with your doctor about your overall diet. Take supplements and vitamins only as told by your doctor.  Talk with your doctor about how much calcium and vitamin D you need each day. These nutrients help to prevent weakening of the bones (osteoporosis).  Include good sources of calcium in your diet, such as:  Dairy products.  Green leafy vegetables.  Products that have had calcium added to them (fortified).  Include good sources of vitamin D in your diet, such as:  Milk.  Foods that have had vitamin D added to them. WHAT SHOULD I KNOW ABOUT MY POSTURE?  Sit up straight and stand up straight. Avoid leaning forward when you sit or hunching over when you stand.  Choose chairs that have good low-back (lumbar) support.  If you work at a desk, sit close to it so you do not need to lean over. Keep your chin tucked in. Keep your neck drawn back. Keep your elbows bent so your arms look like the letter "L" (right angle).  Sit high and close to the steering wheel when you drive. Add a low-back support to your car seat, if needed.  Avoid sitting or standing in one position for very long. Take breaks to get up, stretch, and walk around at least one time every hour. Take breaks every hour if you are driving for long periods of time.  Sleep on your side with your  knees slightly bent, or sleep on your back with a pillow under your knees. Do not lie on the front of your body to sleep. WHAT SHOULD I KNOW ABOUT LIFTING, TWISTING, AND REACHING Lifting and Heavy Lifting  Avoid heavy lifting, especially lifting over and over again. If you must do heavy lifting:  Stretch before lifting.  Work slowly.  Rest between lifts.  Use a tool such as a cart or a dolly to move objects if one is available.  Make several small trips instead of carrying one heavy load.  Ask for help when you need it, especially when moving big objects.  Follow these steps when lifting:  Stand with  your feet shoulder-width apart.  Get as close to the object as you can. Do not pick up a heavy object that is far from your body.  Use handles or lifting straps if they are available.  Bend at your knees. Squat down, but keep your heels off the floor.  Keep your shoulders back. Keep your chin tucked in. Keep your back straight.  Lift the object slowly while you tighten the muscles in your legs, belly, and butt. Keep the object as close to the center of your body as possible.  Follow these steps when putting down a heavy load:  Stand with your feet shoulder-width apart.  Lower the object slowly while you tighten the muscles in your legs, belly, and butt. Keep the object as close to the center of your body as possible.  Keep your shoulders back. Keep your chin tucked in. Keep your back straight.  Bend at your knees. Squat down, but keep your heels off the floor.  Use handles or lifting straps if they are available. Twisting and Reaching  Avoid lifting heavy objects above your waist.  Do not twist at your waist while you are lifting or carrying a load. If you need to turn, move your feet.  Do not bend over without bending at your knees.  Avoid reaching over your head, across a table, or for an object on a high surface.  WHAT ARE SOME OTHER TIPS?  Avoid wet floors and icy ground. Keep sidewalks clear of ice to prevent falls.   Do not sleep on a mattress that is too soft or too hard.   Keep items that you use often within easy reach.   Put heavier objects on shelves at waist level, and put lighter objects on lower or higher shelves.  Find ways to lower your stress, such as:  Exercise.  Massage.  Relaxation techniques.  Talk with your doctor if you feel anxious or depressed. These conditions can make back pain worse.  Wear flat heel shoes with cushioned soles.  Avoid making quick (sudden) movements.  Use both shoulder straps when carrying a backpack.  Do not  use any tobacco products, including cigarettes, chewing tobacco, or electronic cigarettes. If you need help quitting, ask your doctor.   This information is not intended to replace advice given to you by your health care provider. Make sure you discuss any questions you have with your health care provider.   Document Released: 06/16/2007 Document Revised: 05/14/2014 Document Reviewed: 01/01/2014 Elsevier Interactive Patient Education 2016 [place].     Exercise at least 150 minutes per week, including weight resistance exercises 3 or 4 times per week.   Choose healthier foods such as 100% whole grains, vegetables, fruits, beans, nut seeds, olive oil, most vegetable oils, fat-free dietary, wild game and fish.  Avoid  sweet tea, other sweetened beverages, soda, fruit juice, cold cereal and milk and trans fat.  Try to lose at least 7-10% of your current body weight.
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
## 2015-09-12 ENCOUNTER — Encounter (HOSPITAL_COMMUNITY): Payer: Self-pay | Admitting: Family Medicine

## 2015-09-12 ENCOUNTER — Ambulatory Visit (HOSPITAL_COMMUNITY)
Admission: EM | Admit: 2015-09-12 | Discharge: 2015-09-12 | Disposition: A | Discharge status: Home / Self Care | Payer: Medicaid Other | Attending: Physician Assistant | Admitting: Physician Assistant

## 2015-09-12 DIAGNOSIS — L0291 Cutaneous abscess, unspecified: Secondary | ICD-10-CM

## 2015-09-12 MED ORDER — CEPHALEXIN 500 MG PO CAPS: 500.0000 mg | ORAL_CAPSULE | Freq: Two times a day (BID) | ORAL | 0 refills | Status: DC

## 2015-09-12 MED ORDER — FLUCONAZOLE 150 MG PO TABS
150.0000 mg | ORAL_TABLET | Freq: Every day | ORAL | 0 refills | Status: DC
Start: 2015-09-12 — End: 2016-05-24

## 2015-09-12 NOTE — Discharge Instructions (Signed)
This is a very mild infection, glad that it opened which will help. Finish all antibiotics. Keep clean with antibacterial soap and water. Keep covered until healed. If notice any worsening  infection or concerns follow up. Take the difucan if you feel a yeast infection coming with start of antibiotic.

## 2015-09-12 NOTE — ED Provider Notes (Signed)
CSN: [id number]     Arrival date & time 09/12/15  1950 History   First MD Initiated Contact with Patient 09/12/15 2002     Chief Complaint  Patient presents with  . Abscess   (Consider location/radiation/quality/duration/timing/severity/associated sxs/prior Treatment) 30 yo patient presents with a "open boil" to her C-section scar (30 years old). Onset today. Non-tender. question pus and wanted to see if infected. No fever or chills. No pain.       Past Medical History:  Diagnosis Date  . Diabetes mellitus    Type 2, insulin resistant   Past Surgical History:  Procedure Laterality Date  . CERVICAL BIOPSY  2004  . CESAREAN SECTION N/A 06/02/2012   Procedure: CESAREAN SECTION;  Surgeon: Mora Bellman, MD;  Location: [facility];  Service: Obstetrics;  Laterality: N/A;   Family History  Problem Relation Age of Onset  . Diabetes Father    Social History  Substance Use Topics  . Smoking status: Never Smoker  . Smokeless tobacco: Never Used  . Alcohol use No   OB History    Gravida Para Term Preterm AB Living   _0 SAB TAB Ectopic Multiple Live Births           1     Review of Systems  Constitutional: Negative for fatigue and fever.  Gastrointestinal: Negative for abdominal pain.  Skin: Positive for wound. Negative for rash.    Allergies  Review of patient's allergies indicates no known allergies.  Home Medications   Prior to Admission medications   Medication Sig Start Date End Date Taking? Authorizing Provider  ACCU-CHEK FASTCLIX LANCETS MISC Check sugar 5 x daily 03/28/15   Rosemarie Ax, MD  aspirin 325 MG tablet Take 650 mg by mouth daily as needed for mild pain.    Historical Provider, MD  Blood Glucose Monitoring Suppl (ACCU-CHEK AVIVA CONNECT) w/Device KIT 50 Units by Does not apply route 2 (two) times daily. 04/11/15   Rosemarie Ax, MD  cephALEXin (KEFLEX) 500 MG capsule Take 1 capsule (500 mg total) by mouth 2 (two) times daily. 09/12/15   Bjorn Pippin, PA-C  diclofenac (VOLTAREN) 75 MG EC tablet Take 1 tablet (75 mg total) by mouth 2 (two) times daily as needed. 05/30/15   Mariel Aloe, MD  fluconazole (DIFLUCAN) 150 MG tablet Take 1 tablet (150 mg total) by mouth daily. 09/12/15   Bjorn Pippin, PA-C  glucose blood (ACCU-CHEK SMARTVIEW) test strip Check sugar 5 x daily 11/16/13   Renee A Kuneff, DO  glucose blood test strip Use as instructed 03/21/15   Rosemarie Ax, MD  insulin aspart (NOVOLOG FLEXPEN) 100 UNIT/ML FlexPen Inject 25 Units into the skin 3 (three) times daily with meals. 09/09/14   Zenia Resides, MD  insulin detemir (LEVEMIR) 100 UNIT/ML injection Inject 0.5 mLs (50 Units total) into the skin 2 (two) times daily. 03/21/15   Rosemarie Ax, MD  Insulin Pen Needle (B-D ULTRAFINE III SHORT PEN) 31G X 8 MM MISC 1 Container by Does not apply route as needed. 04/23/15   Rosemarie Ax, MD  Insulin Syringe-Needle U-100 Flossie Buffy INSULIN SYRINGE) 31G X 5/16" 1 ML MISC Use to inject insulin. 07/17/15   Carlyle Dolly, MD   Meds Ordered and Administered this Visit  Medications - No data to display  BP 123/83   Pulse 99   Temp 98.4 F (36.9 C)   Resp 18  LMP 08/25/2015   SpO2 98%  No data found.   Physical Exam  Constitutional: She appears well-developed and well-nourished. No distress.  Abdominal: Soft. Bowel sounds are normal. There is no tenderness.  C-section scar with 2 tiny openings with serosanguinous fluid. No erythema or warmth.   Skin: Skin is warm and dry. She is not diaphoretic.  Psychiatric: Her behavior is normal.    Urgent Care Course   Clinical Course    Procedures (including critical care time)  Labs Review Labs Reviewed - No data to display  Imaging Review No results found.   Visual Acuity Review  Right Eye Distance:   Left Eye Distance:   Bilateral Distance:    Right Eye Near:   Left Eye Near:    Bilateral Near:         MDM   1. Abscess    Mild with minimal  drainage. Cover with Keflex. Keep clean and dry. If worsens then f/u. Stable for D/C.     Michelle G Young, PA-C 09/12/15 2021  

## 2015-09-12 NOTE — ED Triage Notes (Signed)
Pt here with abscess to area where her c section scar is. sts it busted today and she wants to make sure its not infected.

## 2015-10-16 ENCOUNTER — Other Ambulatory Visit: Payer: Self-pay | Admitting: Family Medicine

## 2015-10-16 DIAGNOSIS — E1165 Type 2 diabetes mellitus with hyperglycemia: Secondary | ICD-10-CM

## 2015-10-16 DIAGNOSIS — E118 Type 2 diabetes mellitus with unspecified complications: Principal | ICD-10-CM

## 2015-10-16 DIAGNOSIS — IMO0002 Reserved for concepts with insufficient information to code with codable children: Secondary | ICD-10-CM

## 2015-10-17 ENCOUNTER — Other Ambulatory Visit: Payer: Self-pay | Admitting: Family Medicine

## 2015-10-17 DIAGNOSIS — E1165 Type 2 diabetes mellitus with hyperglycemia: Secondary | ICD-10-CM

## 2015-10-17 DIAGNOSIS — E118 Type 2 diabetes mellitus with unspecified complications: Principal | ICD-10-CM

## 2015-10-17 NOTE — Telephone Encounter (Signed)
Pt needs a refill on Novolog and short pen needles. Please advise. Thanks! ep

## 2015-10-18 MED ORDER — INSULIN ASPART 100 UNIT/ML FLEXPEN: 25.0000 [IU] | PEN_INJECTOR | Freq: Three times a day (TID) | SUBCUTANEOUS | 0 refills | Status: DC

## 2015-10-18 MED ORDER — INSULIN PEN NEEDLE 31G X 8 MM MISC: 1.0000 | 11 refills | Status: DC | PRN

## 2015-10-20 ENCOUNTER — Other Ambulatory Visit: Payer: Self-pay | Admitting: Family Medicine

## 2015-10-20 DIAGNOSIS — IMO0002 Reserved for concepts with insufficient information to code with codable children: Secondary | ICD-10-CM

## 2015-10-20 DIAGNOSIS — E1165 Type 2 diabetes mellitus with hyperglycemia: Secondary | ICD-10-CM

## 2015-10-20 DIAGNOSIS — E118 Type 2 diabetes mellitus with unspecified complications: Principal | ICD-10-CM

## 2015-10-20 NOTE — Telephone Encounter (Signed)
Needs refill on novolog. Pt is completely out.  [facility] on [address]

## 2015-11-08 ENCOUNTER — Encounter (HOSPITAL_COMMUNITY): Payer: Self-pay | Admitting: Emergency Medicine

## 2015-11-08 ENCOUNTER — Emergency Department (HOSPITAL_COMMUNITY)
Admission: EM | Admit: 2015-11-08 | Discharge: 2015-11-08 | Disposition: A | Discharge status: Home / Self Care | Payer: Medicaid Other | Attending: Emergency Medicine | Admitting: Emergency Medicine

## 2015-11-08 ENCOUNTER — Emergency Department (HOSPITAL_COMMUNITY): Payer: Medicaid Other

## 2015-11-08 DIAGNOSIS — Z794 Long term (current) use of insulin: Secondary | ICD-10-CM | POA: Diagnosis not present

## 2015-11-08 DIAGNOSIS — W208XXA Other cause of strike by thrown, projected or falling object, initial encounter: Secondary | ICD-10-CM | POA: Diagnosis not present

## 2015-11-08 DIAGNOSIS — S9031XA Contusion of right foot, initial encounter: Secondary | ICD-10-CM

## 2015-11-08 DIAGNOSIS — Z79899 Other long term (current) drug therapy: Secondary | ICD-10-CM | POA: Diagnosis not present

## 2015-11-08 DIAGNOSIS — Y929 Unspecified place or not applicable: Secondary | ICD-10-CM | POA: Diagnosis not present

## 2015-11-08 DIAGNOSIS — Y990 Civilian activity done for income or pay: Secondary | ICD-10-CM | POA: Diagnosis not present

## 2015-11-08 DIAGNOSIS — Z7982 Long term (current) use of aspirin: Secondary | ICD-10-CM | POA: Insufficient documentation

## 2015-11-08 DIAGNOSIS — Y9389 Activity, other specified: Secondary | ICD-10-CM | POA: Diagnosis not present

## 2015-11-08 DIAGNOSIS — I10 Essential (primary) hypertension: Secondary | ICD-10-CM | POA: Insufficient documentation

## 2015-11-08 DIAGNOSIS — E11319 Type 2 diabetes mellitus with unspecified diabetic retinopathy without macular edema: Secondary | ICD-10-CM | POA: Diagnosis not present

## 2015-11-08 DIAGNOSIS — S99921A Unspecified injury of right foot, initial encounter: Secondary | ICD-10-CM | POA: Diagnosis present

## 2015-11-08 MED ORDER — NAPROXEN 500 MG PO TABS: 500.0000 mg | ORAL_TABLET | Freq: Two times a day (BID) | ORAL | 0 refills | Status: DC

## 2015-11-08 MED ORDER — IBUPROFEN 200 MG PO TABS
600.0000 mg | ORAL_TABLET | Freq: Once | ORAL | Status: AC
  Administered 2015-11-08: 600 mg via ORAL
  Filled 2015-11-08: qty 1

## 2015-11-08 NOTE — ED Triage Notes (Signed)
Pt. reported a metal gate fell on her right foot at work this evening , ambulatory , reports pain with mild swelling .

## 2015-11-08 NOTE — ED Provider Notes (Signed)
[facility]ENC[facility]PT Provider Note   CSN: [id number] Arrival date & time: 11/08/15  0014     History   Chief Complaint Chief Complaint  Patient presents with  . Foot Injury    HPI Karina [name] is a 30 y.o. female who presents to the ED with right foot pain. She reports that a gate broke tonight at work and fell on her foot. The injury occurred approximately 4 hours prior to arrival to the ED. The patient has taken nothing for pain. Patient denies any other injuries.   HPI  Past Medical History:  Diagnosis Date  . Diabetes mellitus    Type 2, insulin resistant    Patient Active Problem List   Diagnosis Date Noted  . Back pain 09/11/2015  . Boil 06/06/2015  . Right leg pain 06/05/2015  . Syncope 04/08/2015  . Irregular menstrual cycle 07/04/2014  . Essential hypertension, benign 12/12/2013  . Morbid obesity (HCC) 05/25/2013  . Moderate nonproliferative diabetic retinopathy (HCC) 05/24/2013  . Diabetic neuropathy, type II diabetes mellitus (HCC) 05/23/2013  . Toenail fungus 01/17/2013  . Type II diabetes mellitus with complication, uncontrolled (HCC) 12/27/2012  . Depression 12/27/2012    Past Surgical History:  Procedure Laterality Date  . CERVICAL BIOPSY  2004  . CESAREAN SECTION N/A 06/02/2012   Procedure: CESAREAN SECTION;  Surgeon: Peggy Constant, MD;  Location: [facility];  Service: Obstetrics;  Laterality: N/A;    OB History    Gravida Para Term Preterm AB Living   1 1 1     1   SAB TAB Ectopic Multiple Live Births           1       Home Medications    Prior to Admission medications   Medication Sig Start Date End Date Taking? Authorizing Provider  ACCU-CHEK FASTCLIX LANCETS MISC Check sugar 5 x daily 03/28/15   Jeremy E Schmitz, MD  aspirin 325 MG tablet Take 650 mg by mouth daily as needed for mild pain.    Historical Provider, MD  Blood Glucose Monitoring Suppl (ACCU-CHEK AVIVA CONNECT) w/Device KIT 50 Units by Does not apply route 2 (two) times  daily. 04/11/15   Jeremy E Schmitz, MD  cephALEXin (KEFLEX) 500 MG capsule Take 1 capsule (500 mg total) by mouth 2 (two) times daily. 09/12/15   Michelle G Young, PA-C  diclofenac (VOLTAREN) 75 MG EC tablet Take 1 tablet (75 mg total) by mouth 2 (two) times daily as needed. 05/30/15   Ralph A Nettey, MD  fluconazole (DIFLUCAN) 150 MG tablet Take 1 tablet (150 mg total) by mouth daily. 09/12/15   Michelle G Young, PA-C  glucose blood (ACCU-CHEK SMARTVIEW) test strip Check sugar 5 x daily 11/16/13   Renee A Kuneff, DO  glucose blood test strip Use as instructed 03/21/15   Jeremy E Schmitz, MD  insulin detemir (LEVEMIR) 100 UNIT/ML injection Inject 0.5 mLs (50 Units total) into the skin 2 (two) times daily. 03/21/15   Jeremy E Schmitz, MD  Insulin Pen Needle (B-D ULTRAFINE III SHORT PEN) 31G X 8 MM MISC 1 Container by Does not apply route as needed. 10/18/15   Christina M Gambino, MD  Insulin Syringe-Needle U-100 (ULTICARE INSULIN SYRINGE) 31G X 5/16" 1 ML MISC Use to inject insulin. 07/17/15   Christina M Gambino, MD  naproxen (NAPROSYN) 500 MG tablet Take 1 tablet (500 mg total) by mouth 2 (two) times daily. 11/08/15    M , NP  NOVOLOG FLEXPEN 100 UNIT/ML   FlexPen INJECT 25 UNITS SUBCUTANEOUSLY THREE TIMES DAILY WITH MEALS 10/20/15   Christina M Gambino, MD    Family History Family History  Problem Relation Age of Onset  . Diabetes Father     Social History Social History  Substance Use Topics  . Smoking status: Never Smoker  . Smokeless tobacco: Never Used  . Alcohol use No     Allergies   Review of patient's allergies indicates no known allergies.   Review of Systems Review of Systems Negative except as stated in HPI  Physical Exam Updated Vital Signs BP 146/97 (BP Location: Left Arm)   Pulse 91   Temp 98 F (36.7 C) (Oral)   Resp 18   Ht 5' 2" (1.575 m)   Wt 104.3 kg   LMP 10/16/2015   SpO2 97%   BMI 42.07 kg/m   Physical Exam  Constitutional: She is oriented to  person, place, and time. She appears well-developed and well-nourished.  HENT:  Head: Normocephalic and atraumatic.  Eyes: EOM are normal.  Neck: Neck supple.  Cardiovascular: Normal rate.   Pulmonary/Chest: Effort normal. No respiratory distress.  Musculoskeletal:       Right foot: There is tenderness and swelling. There is normal range of motion, normal capillary refill, no deformity and no laceration.  Pedal pulse 2+, adequate circulation, good touch sensation.  Neurological: She is alert and oriented to person, place, and time. No cranial nerve deficit.  Skin: Skin is warm and dry.  Psychiatric: She has a normal mood and affect. Her behavior is normal.  Nursing note and vitals reviewed.    ED Treatments / Results  Labs (all labs ordered are listed, but only abnormal results are displayed) Labs Reviewed - No data to display   Radiology No results found.  Procedures Procedures (including critical care time)  Medications Ordered in ED Medications  ibuprofen (ADVIL,MOTRIN) tablet 600 mg (600 mg Oral Given 11/08/15 0144)     Initial Impression / Assessment and Plan / ED Course  I have reviewed the triage vital signs and the nursing notes.  Pertinent labs & imaging results that were available during my care of the patient were reviewed by me and considered in my medical decision making (see chart for details).  Clinical Course  30 y.o. female with right foot pain s/p injury at work stable for d/c without focal neuro deficits. Ace wrap, ice, elevation and return as needed for worsening symptoms.  NSAIDS for pain. Final Clinical Impressions(s) / ED Diagnoses   Final diagnoses:  Contusion of right foot, initial encounter    New Prescriptions Discharge Medication List as of 11/08/2015  2:30 AM    START taking these medications   Details  naproxen (NAPROSYN) 500 MG tablet Take 1 tablet (500 mg total) by mouth 2 (two) times daily., Starting Sat 11/08/2015, Print           M , NP 11/10/15 0214    Christopher J Pollina, MD 11/10/15 0307  

## 2015-11-10 ENCOUNTER — Encounter: Payer: Self-pay | Admitting: Internal Medicine

## 2015-11-10 ENCOUNTER — Ambulatory Visit (INDEPENDENT_AMBULATORY_CARE_PROVIDER_SITE_OTHER): Payer: Medicaid Other | Admitting: Internal Medicine

## 2015-11-10 VITALS — BP 145/73 | HR 100 | Temp 98.5°F | Ht 62.0 in | Wt 230.0 lb

## 2015-11-10 DIAGNOSIS — B373 Candidiasis of vulva and vagina: Secondary | ICD-10-CM | POA: Diagnosis not present

## 2015-11-10 DIAGNOSIS — I10 Essential (primary) hypertension: Secondary | ICD-10-CM | POA: Diagnosis not present

## 2015-11-10 DIAGNOSIS — Z23 Encounter for immunization: Secondary | ICD-10-CM

## 2015-11-10 DIAGNOSIS — E1140 Type 2 diabetes mellitus with diabetic neuropathy, unspecified: Secondary | ICD-10-CM | POA: Diagnosis not present

## 2015-11-10 DIAGNOSIS — E118 Type 2 diabetes mellitus with unspecified complications: Secondary | ICD-10-CM | POA: Diagnosis not present

## 2015-11-10 DIAGNOSIS — S9031XD Contusion of right foot, subsequent encounter: Secondary | ICD-10-CM

## 2015-11-10 NOTE — Patient Instructions (Signed)
Continue Naproxen as needed You can also take Tylenol 650mg  every 6 hrs PRN Continue ICE and elevate.

## 2015-11-10 NOTE — Progress Notes (Signed)
   [facility] Phone: [phone]   Date of Visit: 11/10/2015   HPI: Patient is here for ED follow up. She was seen on 10/28 and diagnosed with contusion of R foot.  - reports that a metal gate of a cart at work opened and fell on her toes - Piece of broken equipmentment that was not tagged. Reports the cart is pretty heavy with packed with trays. Cart has two latches that holds the metal gate closed. But this particular one only had one.  - the gate opened due to this and fell on her toes - she was wearing closed toed shoes (recommended type of she by her work) - she had xray of her foot in the ED which showed no acute abnormality - she has been using Naproxen BID PRN. Reports pain is improved but relief does not last a full 12 hours.  - she has also been icing the area, elevating and wrapping  - she still has difficulty fully putting weight mainly on her great toe/ball of her feet - reports she has an appointment with orthopedics tomorrow set up by ED  ROS: See HPI.  PMFSH:  Obestiy HTN DM  PHYSICAL EXAM: BP (!) 145/73   Pulse 100   Temp 98.5 F (36.9 C) (Oral)   Ht 5\' 2"  (1.575 m)   Wt 230 lb (104.3 kg)   LMP 10/16/2015   SpO2 98%   BMI 42.07 kg/m  Gen: NAD, sitting on a wheelchair MSK: Lower extremities without swelling. Right foot: initially wrapped in ace bandage. No erythema noted on foot. Tenderness to palpation of the distal IP joint and nail of the R great toe and mild tenderness to palpation of the . Pain with passive ROM of right great toe and limited active ROM of right great toe. No tenderness to palpation of other toes. Very small dark area on the center of the great toe nail. Normal DP pulse. No other tenderness to palpation. No increased warmth.   ASSESSMENT/PLAN:  Contusion of R Foot, subsequent encounter: x-ray in ED without acute findings - continue Naproxen BID PRN  - recommended Tylenol 650mg  q 6 PRN and to stagger medication  with Naproxen  - continue ice   Palma HolterKanishka G Sheyli Horwitz, MD PGY 2 [facility]

## 2015-11-20 ENCOUNTER — Other Ambulatory Visit: Payer: Self-pay | Admitting: Internal Medicine

## 2015-11-20 NOTE — Progress Notes (Signed)
Left voice message for patient that form is complete and ready for pick up.  Forms copied for scanning in patient's record.  Clovis PuMartin, Marializ Ferrebee L, RN

## 2015-11-20 NOTE — Progress Notes (Signed)
Completed form for work for patient after injury at work. Placed in Ms. Tamika's box.

## 2015-11-24 ENCOUNTER — Ambulatory Visit (INDEPENDENT_AMBULATORY_CARE_PROVIDER_SITE_OTHER): Payer: Medicaid Other | Admitting: Family Medicine

## 2015-11-24 VITALS — BP 130/74 | HR 102 | Temp 98.5°F | Ht 62.0 in | Wt 224.2 lb

## 2015-11-24 DIAGNOSIS — I10 Essential (primary) hypertension: Secondary | ICD-10-CM | POA: Diagnosis not present

## 2015-11-24 DIAGNOSIS — S9031XD Contusion of right foot, subsequent encounter: Secondary | ICD-10-CM

## 2015-11-24 DIAGNOSIS — E118 Type 2 diabetes mellitus with unspecified complications: Secondary | ICD-10-CM | POA: Diagnosis not present

## 2015-11-24 DIAGNOSIS — S9031XA Contusion of right foot, initial encounter: Secondary | ICD-10-CM | POA: Insufficient documentation

## 2015-11-24 DIAGNOSIS — B373 Candidiasis of vulva and vagina: Secondary | ICD-10-CM | POA: Diagnosis not present

## 2015-11-24 DIAGNOSIS — Z794 Long term (current) use of insulin: Secondary | ICD-10-CM

## 2015-11-24 DIAGNOSIS — E1165 Type 2 diabetes mellitus with hyperglycemia: Secondary | ICD-10-CM

## 2015-11-24 DIAGNOSIS — IMO0002 Reserved for concepts with insufficient information to code with codable children: Secondary | ICD-10-CM

## 2015-11-24 DIAGNOSIS — E1140 Type 2 diabetes mellitus with diabetic neuropathy, unspecified: Secondary | ICD-10-CM | POA: Diagnosis not present

## 2015-11-24 LAB — POCT GLYCOSYLATED HEMOGLOBIN (HGB A1C): HEMOGLOBIN A1C: 13.5

## 2015-11-24 MED ORDER — ACCU-CHEK FASTCLIX LANCETS MISC: 3 refills | Status: DC

## 2015-11-24 MED ORDER — "INSULIN SYRINGE-NEEDLE U-100 31G X 5/16"" 1 ML MISC": 1 refills | Status: DC

## 2015-11-24 MED ORDER — GLUCOSE BLOOD VI STRP: ORAL_STRIP | 3 refills | Status: DC

## 2015-11-24 MED ORDER — INSULIN DETEMIR 100 UNIT/ML ~~LOC~~ SOLN: 50.0000 [IU] | Freq: Two times a day (BID) | SUBCUTANEOUS | 11 refills | Status: DC

## 2015-11-24 MED ORDER — INSULIN ASPART 100 UNIT/ML FLEXPEN: PEN_INJECTOR | SUBCUTANEOUS | 11 refills | Status: DC

## 2015-11-24 NOTE — Progress Notes (Signed)
Subjective:    Patient ID: Karina [name] , female   DOB: 1985/08/23 , 30 y.o..   MRN: [id number]  HPI  Karina [name] is here for  Chief Complaint  Patient presents with  . Rt foot injury   Right Foot Injury: Was seen by Dr. Ottie GlazierGunadasa on 10/30 after on injury on 10/27 where a gait fell on her toes. XRAY in the ED was normal. Last Monday saw orthopedic and was placed in a boot. Is seeing orthopedic next Monday. She was cleared to go to work, but there wasn't a toe cover on the boot which inhibits her from doing her job. Her work required a metal toe cover on the boot. Is concerned about her toe because she is diabetic. Naproxen and the Tylenol have been helping the pain. The pain has not become worse.   T2DM: Patient admits that she has not been very compliant with her medications. She has not been taking her Levemir as prescribed. She is taking her NovoLog; 25 units 3 times a day with meals. She is concerned about her diabetes and would like to get under control. She has not been checking her glucose recently.   Review of Systems: Per HPI. All other systems reviewed and are negative.  Medications: reviewed and updated  Social Hx:  reports that she has never smoked. She has never used smokeless tobacco.   Objective:   BP 130/74   Pulse (!) 102   Temp 98.5 F (36.9 C) (Oral)   Ht 5\' 2"  (1.575 m)   Wt 224 lb 3.2 oz (101.7 kg)   LMP 10/22/2015 (Exact Date)   SpO2 97%   BMI 41.01 kg/m  Physical Exam  Gen: NAD, alert, cooperative with exam, well-appearing Skin: no rashes, normal turgor  Neurological: no gross deficits.  Psych: good insight, normal mood and affect MSK:  Right Ankle & Foot: Patient initially had boot on her foot. When boot taken off, no visible swelling, ecchymosis, erythema, ulcers, calluses, blister Arch: Normal w/o pes cavus or planus  Toes: no claw or hammer deformity  Tenderness to palpation of middle body of1st MT and palpation of 1st great toe No  pain at base of 5th MT; No tenderness over cuboid; No tenderness over N spot or navicular prominence. No tenderness on lateral and medial malleolus Full dorsiflexion, inversion, and eversion of the foot; flexion and extension of the toes. Pain with plantar flexion.  Strength: 5/5 in all directions. Sensation: intact Vascular: intact w/ dorsalis pedis & posterior tibialis pulses 2+ Stable lateral and medial ligaments; Negative Anterior drawer test   Assessment & Plan:  Contusion of right foot Initial X-ray normal. Continues to have pain where injury was on 10/27. Is in boot now per orthopedic specialist. Patient still unable to do full duty at work as her work requires to her to have a steel toe boots which she cannot have at this time. Advise continue alternating naproxen and Tylenol. Patient is following up with orthopedic specialist in 1 week. Letter for work written to excuse her until she sees orthopedic specialist again.  Type II diabetes mellitus with complication, uncontrolled (HCC) Uncontrolled. Patient came in for same day visit for her right foot pain although we did briefly discuss her diabetes. Hemoglobin A1c was ordered today and was 13.5. Discussed that we will need to follow up in 1-2 weeks for further diabetic management as her diabetes is very uncontrolled and we will need to talk about it for an entire  visit. Patient expressed good understanding. Encouraged patient to eliminate or at least decrease sugary beverages.    Anders Simmondshristina Gambino, MD [facility], PGY-2

## 2015-11-24 NOTE — Patient Instructions (Signed)
Thank you for coming in today, it was so nice to see you! Today we talked about:    Foot pain: Please continue alternating naproxen and Tylenol as needed for pain. You can also continue icing her foot. I would follow up with orthopedic surgery closely.  Diabetes; your diabetes is uncontrolled. Please follow up in 1-2 weeks to discuss this further as we will need to get your sugars under control. You can schedule this appointment at the front desk before you leave or call the clinic.  Bring in all your medications or supplements to each appointment for review.   If we ordered any tests today, you will be notified via telephone of any abnormalities. If everything is normal you will get a letter in the mail.   If you have any questions or concerns, please do not hesitate to call the office at [phone]. You can also message me directly via MyChart.   Sincerely,  Anders Simmondshristina Gambino, MD

## 2015-11-24 NOTE — Assessment & Plan Note (Signed)
Initial X-ray normal. Continues to have pain where injury was on 10/27. Is in boot now per orthopedic specialist. Patient still unable to do full duty at work as her work requires to her to have a steel toe boots which she cannot have at this time. Advise continue alternating naproxen and Tylenol. Patient is following up with orthopedic specialist in 1 week. Letter for work written to excuse her until she sees orthopedic specialist again.

## 2015-11-24 NOTE — Assessment & Plan Note (Addendum)
Uncontrolled. Patient came in for same day visit for her right foot pain although we did briefly discuss her diabetes. Hemoglobin A1c was ordered today and was 13.5. Discussed that we will need to follow up in 1-2 weeks for further diabetic management as her diabetes is very uncontrolled and we will need to talk about it for an entire visit. Patient expressed good understanding. Encouraged patient to eliminate or at least decrease sugary beverages.

## 2015-12-08 ENCOUNTER — Ambulatory Visit (INDEPENDENT_AMBULATORY_CARE_PROVIDER_SITE_OTHER): Payer: Medicaid Other | Admitting: Family Medicine

## 2015-12-08 DIAGNOSIS — E118 Type 2 diabetes mellitus with unspecified complications: Secondary | ICD-10-CM | POA: Diagnosis not present

## 2015-12-08 DIAGNOSIS — IMO0002 Reserved for concepts with insufficient information to code with codable children: Secondary | ICD-10-CM

## 2015-12-08 DIAGNOSIS — Z794 Long term (current) use of insulin: Secondary | ICD-10-CM

## 2015-12-08 DIAGNOSIS — E1165 Type 2 diabetes mellitus with hyperglycemia: Secondary | ICD-10-CM | POA: Diagnosis not present

## 2015-12-08 MED ORDER — INSULIN DETEMIR 100 UNIT/ML ~~LOC~~ SOLN: 50.0000 [IU] | Freq: Two times a day (BID) | SUBCUTANEOUS | 11 refills | Status: DC

## 2015-12-08 MED ORDER — INSULIN ASPART 100 UNIT/ML FLEXPEN: PEN_INJECTOR | SUBCUTANEOUS | 11 refills | Status: DC

## 2015-12-08 MED ORDER — INSULIN PEN NEEDLE 31G X 8 MM MISC: 1.0000 | 11 refills | Status: DC | PRN

## 2015-12-08 MED ORDER — ACCU-CHEK FASTCLIX LANCETS MISC: 3 refills | Status: DC

## 2015-12-08 MED ORDER — GLUCOSE BLOOD VI STRP: ORAL_STRIP | 3 refills | Status: DC

## 2015-12-08 MED ORDER — "INSULIN SYRINGE-NEEDLE U-100 31G X 5/16"" 1 ML MISC": 1 refills | Status: DC

## 2015-12-08 NOTE — Assessment & Plan Note (Addendum)
Uncontrolled. Last hemoglobin A1c on November 13 was 13.5. She has been intolerant to metformin in the past due to GI upset, therefore her diabetes regimen has only been NovoLog 25 units 3 times a day and Levemir 50 units twice a day, although she has not taken her Levemir in one month. Discussed patient with Dr. Raymondo BandKoval for insulin regimen. -Continue 25 units of NovoLog 3 times a day -Resume Levemir at decreased dose of 40 units twice a day, if glucose is greater than 100 over the next couple days patient instructed to increase to 50 units twice a day -Refilled all diabetic supplies and medications -Continue to check CBGs -We will check urine microalbumin at next visit instead of today per patient preference -Follow-up in one month

## 2015-12-08 NOTE — Progress Notes (Signed)
Subjective:    Patient ID: [name] , female   DOB: 1985/07/26 , 30 y.o..   MRN: [id number]  HPI  Honor MEKLIT COTTA is here for diabetes follow up.   Chronic Diabetes  Disease Monitoring  Blood Sugar Ranges: Has not been checking because she is out of test strips  Polyuria: yes   Visual problems: no   Last hemoglobin A1C:  Lab Results  Component Value Date   HGBA1C 13.5 11/24/2015   Medication Compliance: no, patient is out of Levemir and needles. She continues to take 25 units of NovoLog each day. Medication Side Effects  Hypoglycemia: no   Preventitive Health Care  Eye Exam: Overdue  Foot Exam: up to date  Diet pattern: She continues crave candy and icecream. Has not been compliant with low sugar diet   Exercise: Minimal due to right foot injury  Review of Systems: Per HPI. All other systems reviewed and are negative.  Past Medical History: Patient Active Problem List   Diagnosis Date Noted  . Contusion of right foot 11/24/2015  . Back pain 09/11/2015  . Boil 06/06/2015  . Right leg pain 06/05/2015  . Syncope 04/08/2015  . Irregular menstrual cycle 07/04/2014  . Essential hypertension, benign 12/12/2013  . Morbid obesity ([place]) 05/25/2013  . Moderate nonproliferative diabetic retinopathy ([place]) 05/24/2013  . Diabetic neuropathy, type II diabetes mellitus ([place]) 05/23/2013  . Toenail fungus 01/17/2013  . Type II diabetes mellitus with complication, uncontrolled ([place]) 12/27/2012  . Depression 12/27/2012    Medications: reviewed and updated Current Outpatient Prescriptions  Medication Sig Dispense Refill  . ACCU-CHEK FASTCLIX LANCETS MISC Check sugar 5 x daily 204 each 3  . aspirin 325 MG tablet Take 650 mg by mouth daily as needed for mild pain.    . Blood Glucose Monitoring Suppl (ACCU-CHEK AVIVA CONNECT) w/Device KIT 50 Units by Does not apply route 2 (two) times daily. 1 kit 0  . fluconazole (DIFLUCAN) 150 MG tablet Take 1 tablet (150 mg total) by  mouth daily. (Patient not taking: Reported on 11/24/2015) 1 tablet 0  . glucose blood (ACCU-CHEK SMARTVIEW) test strip Check sugar 5 x daily 200 each 3  . insulin aspart (NOVOLOG FLEXPEN) 100 UNIT/ML FlexPen INJECT 25 UNITS SUBCUTANEOUSLY THREE TIMES DAILY WITH MEALS 15 pen 11  . insulin detemir (LEVEMIR) 100 UNIT/ML injection Inject 0.5 mLs (50 Units total) into the skin 2 (two) times daily. 10 mL 11  . Insulin Pen Needle (B-D ULTRAFINE III SHORT PEN) 31G X 8 MM MISC 1 Container by Does not apply route as needed. 1 each 11  . Insulin Syringe-Needle U-100 (ULTICARE INSULIN SYRINGE) 31G X 5/16" 1 ML MISC Use to inject insulin. 200 each 1  . naproxen (NAPROSYN) 500 MG tablet Take 1 tablet (500 mg total) by mouth 2 (two) times daily. 20 tablet 0   No current facility-administered medications for this visit.     Social Hx:  reports that she has never smoked. She has never used smokeless tobacco.   Objective:   BP 129/90   Temp 98.4 F (36.9 C) (Oral)   Ht '5\' 2"'$  (1.575 m)   Wt 226 lb 12.8 oz (102.9 kg)   LMP 10/22/2015 (Exact Date)   SpO2 98%   BMI 41.48 kg/m  Physical Exam  Gen: NAD, alert, cooperative with exam, well-appearing HEENT: NCAT, PERRL, clear conjunctiva, oropharynx clear, supple neck Cardiac: Regular rate and rhythm, normal S1/S2, no murmur, no edema, capillary refill brisk  Respiratory: Clear  to auscultation bilaterally, no wheezes, non-labored breathing Gastrointestinal: soft, non tender, non distended, bowel sounds present Skin: no rashes, normal turgor  Neurological: no gross deficits.  Psych: good insight, normal mood and affect   Assessment & Plan:  Type II diabetes mellitus with complication, uncontrolled (HCC) Uncontrolled. Last hemoglobin A1c on November 13 was 13.5. She has been intolerant to metformin in the past due to GI upset, therefore her diabetes regimen has only been NovoLog 25 units 3 times a day and Levemir 50 units twice a day, although she has not  taken her Levemir in one month. Discussed patient with Dr. Valentina Lucks for insulin regimen. -Continue 25 units of NovoLog 3 times a day -Resume Levemir at decreased dose of 40 units twice a day, if glucose is greater than 100 over the next couple days patient instructed to increase to 50 units twice a day -Refilled all diabetic supplies and medications -Continue to check CBGs -We will check urine microalbumin at next visit instead of today per patient preference -Follow-up in one month   Smitty Cords, MD [facility], PGY-2

## 2015-12-08 NOTE — Patient Instructions (Signed)
Thank you for coming in today, it was so nice to see you! Today we talked about:    Diabetes: I have refilled all your medications and supplies. Continue taking Novolog 25 units three times a day, start taking Levemir 40 units twice daily. If your glucose is greater than 100 when you check it over the next couple days, you can go up to 50 units twice daily. I will check in with you next week.   Please follow up in 1 month. You can schedule this appointment at the front desk before you leave or call the clinic.  Bring in all your medications or supplements to each appointment for review.   If we ordered any tests today, you will be notified via telephone of any abnormalities. If everything is normal you will get a letter in the mail.   If you have any questions or concerns, please do not hesitate to call the office at [phone]. You can also message me directly via MyChart.   Sincerely,  Smitty Cords, MD    Hypoglycemia Hypoglycemia occurs when the level of sugar (glucose) in the blood is too low. Glucose is a type of sugar that provides the body's main source of energy. Certain hormones (insulin and glucagon) control the level of glucose in the blood. Insulin lowers blood glucose, and glucagon increases blood glucose. Hypoglycemia can result from having too much insulin in the bloodstream, or from not eating enough food that contains glucose. Hypoglycemia can happen in people who do or do not have diabetes. It can develop quickly, and it can be a medical emergency. What are the causes? Hypoglycemia occurs most often in people who have diabetes. If you have diabetes, hypoglycemia may be caused by:  Diabetes medicine.  Not eating enough, or not eating often enough.  Increased physical activity.  Drinking alcohol, especially when you have not eaten recently. If you do not have diabetes, hypoglycemia may be caused by:  A tumor in the pancreas. The pancreas is the organ that  makes insulin.  Not eating enough, or not eating for long periods at a time (fasting).  Severe infection or illness that affects the liver, heart, or kidneys.  Certain medicines. You may also have reactive hypoglycemia. This condition causes hypoglycemia within 4 hours of eating a meal. This may occur after having stomach surgery. Sometimes, the cause of reactive hypoglycemia is not known. What increases the risk? Hypoglycemia is more likely to develop in:  People who have diabetes and take medicines to lower blood glucose.  People who abuse alcohol.  People who have a severe illness. What are the signs or symptoms? Hypoglycemia may not cause any symptoms. If you have symptoms, they may include:  Hunger.  Anxiety.  Sweating and feeling clammy.  Confusion.  Dizziness or feeling light-headed.  Sleepiness.  Nausea.  Increased heart rate.  Headache.  Blurry vision.  Seizure.  Nightmares.  Tingling or numbness around the mouth, lips, or tongue.  A change in speech.  Decreased ability to concentrate.  A change in coordination.  Restless sleep.  Tremors or shakes.  Fainting.  Irritability. How is this diagnosed? Hypoglycemia is diagnosed with a blood test to measure your blood glucose level. This blood test is done while you are having symptoms. Your health care provider may also do a physical exam and review your medical history. If you do not have diabetes, other tests may be done to find the cause of your hypoglycemia. How is this treated? This  condition can often be treated by immediately eating or drinking something that contains glucose, such as:  3-4 sugar tablets (glucose pills).  Glucose gel, 15-gram tube.  Fruit juice, 4 oz (120 mL).  Regular soda (not diet soda), 4 oz (120 mL).  Low-fat milk, 4 oz (120 mL).  Several pieces of hard candy.  Sugar or honey, 1 Tbsp. Treating Hypoglycemia If You Have Diabetes  If you are alert and able to  swallow safely, follow the 15:15 rule:  Take 15 grams of a rapid-acting carbohydrate. Rapid-acting options include:  1 tube of glucose gel.  3 glucose pills.  6-8 pieces of hard candy.  4 oz (120 mL) of fruit juice.  4 oz (120 ml) of regular (not diet) soda.  Check your blood glucose 15 minutes after you take the carbohydrate.  If the repeat blood glucose level is still at or below 70 mg/dL (3.9 mmol/L), take 15 grams of a carbohydrate again.  If your blood glucose level does not increase above 70 mg/dL (3.9 mmol/L) after 3 tries, seek emergency medical care.  After your blood glucose level returns to normal, eat a meal or a snack within 1 hour. Treating Severe Hypoglycemia  Severe hypoglycemia is when your blood glucose level is at or below 54 mg/dL (3 mmol/L). Severe hypoglycemia is an emergency. Do not wait to see if the symptoms will go away. Get medical help right away. Call your local emergency services (911 in the [place] Do not drive yourself to the hospital. If you have severe hypoglycemia and you cannot eat or drink, you may need an injection of glucagon. A family member or close friend should learn how to check your blood glucose and how to give you a glucagon injection. Ask your health care provider if you need to have an emergency glucagon injection kit available. Severe hypoglycemia may need to be treated in a hospital. The treatment may include getting glucose through an IV tube. You may also need treatment for the cause of your hypoglycemia. Follow these instructions at home: General instructions  Avoid any diets that cause you to not eat enough food. Talk with your health care provider before you start any new diet.  Take over-the-counter and prescription medicines only as told by your health care provider.  Limit alcohol intake to no more than 1 drink per day for nonpregnant women and 2 drinks per day for men. One drink equals 12 oz of beer, 5 oz of wine, or 1 oz of  hard liquor.  Keep all follow-up visits as told by your health care provider. This is important. If You Have Diabetes:   Make sure you know the symptoms of hypoglycemia.  Always have a rapid-acting carbohydrate snack with you to treat low blood sugar.  Follow your diabetes management plan, as told by your health care provider. Make sure you:  Take your medicines as directed.  Follow your exercise plan.  Follow your meal plan. Eat on time, and do not skip meals.  Check your blood glucose as often as directed. Make sure to check your blood glucose before and after exercise. If you exercise longer or in a different way than usual, check your blood glucose more often.  Follow your sick day plan whenever you cannot eat or drink normally. Make this plan in advance with your health care provider.  Share your diabetes management plan with people in your workplace, school, and household.  Check your urine for ketones when you are ill and  as told by your health care provider.  Carry a medical alert card or wear medical alert jewelry. If You Have Reactive Hypoglycemia or Low Blood Sugar From Other Causes:  Monitor your blood glucose as told by your health care provider.  Follow instructions from your health care provider about eating or drinking restrictions. Contact a health care provider if:  You have problems keeping your blood glucose in your target range.  You have frequent episodes of hypoglycemia. Get help right away if:  You continue to have hypoglycemia symptoms after eating or drinking something containing glucose.  Your blood glucose is at or below 54 mg/dL (3 mmol/L).  You have a seizure.  You faint. These symptoms may represent a serious problem that is an emergency. Do not wait to see if the symptoms will go away. Get medical help right away. Call your local emergency services (911 in the [place] Do not drive yourself to the hospital.  This information is not intended  to replace advice given to you by your health care provider. Make sure you discuss any questions you have with your health care provider. Document Released: 12/28/2004 Document Revised: 06/11/2015 Document Reviewed: 01/31/2015 Elsevier Interactive Patient Education  2017 [employer].

## 2015-12-20 ENCOUNTER — Emergency Department (HOSPITAL_COMMUNITY)
Admission: EM | Admit: 2015-12-20 | Discharge: 2015-12-20 | Disposition: A | Discharge status: Home / Self Care | Payer: Medicaid Other | Attending: Emergency Medicine | Admitting: Emergency Medicine

## 2015-12-20 ENCOUNTER — Encounter (HOSPITAL_COMMUNITY): Payer: Self-pay | Admitting: Emergency Medicine

## 2015-12-20 ENCOUNTER — Emergency Department (HOSPITAL_COMMUNITY): Payer: Medicaid Other

## 2015-12-20 DIAGNOSIS — S161XXA Strain of muscle, fascia and tendon at neck level, initial encounter: Secondary | ICD-10-CM | POA: Diagnosis not present

## 2015-12-20 DIAGNOSIS — M25561 Pain in right knee: Secondary | ICD-10-CM | POA: Diagnosis not present

## 2015-12-20 DIAGNOSIS — E1140 Type 2 diabetes mellitus with diabetic neuropathy, unspecified: Secondary | ICD-10-CM | POA: Diagnosis not present

## 2015-12-20 DIAGNOSIS — Y92410 Unspecified street and highway as the place of occurrence of the external cause: Secondary | ICD-10-CM | POA: Insufficient documentation

## 2015-12-20 DIAGNOSIS — Z7982 Long term (current) use of aspirin: Secondary | ICD-10-CM | POA: Diagnosis not present

## 2015-12-20 DIAGNOSIS — Z794 Long term (current) use of insulin: Secondary | ICD-10-CM | POA: Insufficient documentation

## 2015-12-20 DIAGNOSIS — I10 Essential (primary) hypertension: Secondary | ICD-10-CM | POA: Insufficient documentation

## 2015-12-20 DIAGNOSIS — S199XXA Unspecified injury of neck, initial encounter: Secondary | ICD-10-CM | POA: Diagnosis present

## 2015-12-20 DIAGNOSIS — Y999 Unspecified external cause status: Secondary | ICD-10-CM | POA: Diagnosis not present

## 2015-12-20 DIAGNOSIS — Y939 Activity, unspecified: Secondary | ICD-10-CM | POA: Diagnosis not present

## 2015-12-20 DIAGNOSIS — S39012A Strain of muscle, fascia and tendon of lower back, initial encounter: Secondary | ICD-10-CM | POA: Insufficient documentation

## 2015-12-20 MED ORDER — HYDROCODONE-ACETAMINOPHEN 5-325 MG PO TABS: 1.0000 | ORAL_TABLET | Freq: Four times a day (QID) | ORAL | 0 refills | Status: DC | PRN

## 2015-12-20 MED ORDER — CYCLOBENZAPRINE HCL 10 MG PO TABS: 10.0000 mg | ORAL_TABLET | Freq: Two times a day (BID) | ORAL | 0 refills | Status: DC | PRN

## 2015-12-20 MED ORDER — HYDROCODONE-ACETAMINOPHEN 5-325 MG PO TABS
2.0000 | ORAL_TABLET | Freq: Once | ORAL | Status: AC
  Administered 2015-12-20: 2 via ORAL
  Filled 2015-12-20: qty 2
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
## 2016-01-07 NOTE — Progress Notes (Deleted)
Subjective:    Patient ID: Karina [name] , female   DOB: 06-Mar-1985 , 30 y.o..   MRN: [id number]  HPI  Karina [name] is here for ***.  Chronic Diabetes  Disease Monitoring  Blood Sugar Ranges: ***  Polyuria: {YES/NO/WILD CARDS:18581}   Visual problems: {YES/NO/WILD CARDS:18581}   Last hemoglobin A1C:  Lab Results  Component Value Date   HGBA1C 13.5 11/24/2015    Medication Compliance: yes, NovoLog 25 units 3 times a day and Levemir 40 units twice a day*** Medication Side Effects  Hypoglycemia: {YES/NO/WILD YPPJK:93267}   Letts Exam: ***  Foot Exam: ***  Diet pattern: ***  Exercise: ***    Review of Systems: Per HPI. All other systems reviewed and are negative.  Health Maintenance Due  Topic Date Due  . PNEUMOCOCCAL POLYSACCHARIDE VACCINE (1) 07/31/1985  . OPHTHALMOLOGY EXAM  05/24/2014  . URINE MICROALBUMIN  05/31/2014    Past Medical History: Patient Active Problem List   Diagnosis Date Noted  . Contusion of right foot 11/24/2015  . Back pain 09/11/2015  . Boil 06/06/2015  . Right leg pain 06/05/2015  . Syncope 04/08/2015  . Irregular menstrual cycle 07/04/2014  . Essential hypertension, benign 12/12/2013  . Morbid obesity ([place]) 05/25/2013  . Moderate nonproliferative diabetic retinopathy ([place]) 05/24/2013  . Diabetic neuropathy, type II diabetes mellitus ([place]) 05/23/2013  . Toenail fungus 01/17/2013  . Type II diabetes mellitus with complication, uncontrolled ([place]) 12/27/2012  . Depression 12/27/2012    Medications: reviewed and updated Current Outpatient Prescriptions  Medication Sig Dispense Refill  . ACCU-CHEK FASTCLIX LANCETS MISC Check sugar 5 x daily 204 each 3  . aspirin 325 MG tablet Take 650 mg by mouth daily as needed for mild pain.    . Blood Glucose Monitoring Suppl (ACCU-CHEK AVIVA CONNECT) w/Device KIT 50 Units by Does not apply route 2 (two) times daily. 1 kit 0  . cyclobenzaprine (FLEXERIL) 10 MG tablet  Take 1 tablet (10 mg total) by mouth 2 (two) times daily as needed for muscle spasms. 20 tablet 0  . fluconazole (DIFLUCAN) 150 MG tablet Take 1 tablet (150 mg total) by mouth daily. (Patient not taking: Reported on 11/24/2015) 1 tablet 0  . glucose blood (ACCU-CHEK SMARTVIEW) test strip Check sugar 5 x daily 200 each 3  . HYDROcodone-acetaminophen (NORCO/VICODIN) 5-325 MG tablet Take 1-2 tablets by mouth every 6 (six) hours as needed. 7 tablet 0  . insulin aspart (NOVOLOG FLEXPEN) 100 UNIT/ML FlexPen INJECT 25 UNITS SUBCUTANEOUSLY THREE TIMES DAILY WITH MEALS 15 pen 11  . insulin detemir (LEVEMIR) 100 UNIT/ML injection Inject 0.5 mLs (50 Units total) into the skin 2 (two) times daily. 10 mL 11  . Insulin Pen Needle (B-D ULTRAFINE III SHORT PEN) 31G X 8 MM MISC 1 Container by Does not apply route as needed. 1 each 11  . Insulin Syringe-Needle U-100 (ULTICARE INSULIN SYRINGE) 31G X 5/16" 1 ML MISC Use to inject insulin. 200 each 1  . naproxen (NAPROSYN) 500 MG tablet Take 1 tablet (500 mg total) by mouth 2 (two) times daily. 20 tablet 0   No current facility-administered medications for this visit.     Social Hx:  reports that she has never smoked. She has never used smokeless tobacco.   Objective:   LMP 11/19/2015  Physical Exam  Gen: NAD, alert, cooperative with exam, well-appearing HEENT: NCAT, PERRL, clear conjunctiva, oropharynx clear, supple neck Cardiac: Regular rate and rhythm, normal S1/S2, no murmur, no  edema, capillary refill brisk  Respiratory: Clear to auscultation bilaterally, no wheezes, non-labored breathing Gastrointestinal: soft, non tender, non distended, bowel sounds present Skin: no rashes, normal turgor  Neurological: no gross deficits.  Psych: good insight, normal mood and affect   Assessment & Plan:  No problem-specific Assessment & Plan notes found for this encounter.   Smitty Cords, MD [place], PGY-2

## 2016-01-08 ENCOUNTER — Ambulatory Visit: Payer: Medicaid Other | Admitting: Family Medicine

## 2016-01-22 ENCOUNTER — Ambulatory Visit: Payer: Medicaid Other | Admitting: Family Medicine

## 2016-01-22 NOTE — Progress Notes (Deleted)
Subjective:    Patient ID: Karina [name] , female   DOB: 12/07/1985 , 31 y.o..   MRN: [id number]  HPI  Karina [name] is here for ***.  Right Foot Injury: Was seen by Dr. Dallas Schimke on 10/30 after on injury on 10/27 where a gait fell on her toes. XRAY in the ED was normal. Last Monday saw orthopedic and was placed in a boot. Is seeing orthopedic next Monday. She was cleared to go to work, but there wasn't a toe cover on the boot which inhibits her from doing her job. Her work required a metal toe cover on the boot. Is concerned about her toe because she is diabetic. Naproxen and the Tylenol have been helping the pain. The pain has not become worse   Review of Systems: Per HPI. All other systems reviewed and are negative.  Health Maintenance Due  Topic Date Due  . PNEUMOCOCCAL POLYSACCHARIDE VACCINE (1) 07/31/1985  . OPHTHALMOLOGY EXAM  05/24/2014  . URINE MICROALBUMIN  05/31/2014    Past Medical History: Patient Active Problem List   Diagnosis Date Noted  . Contusion of right foot 11/24/2015  . Back pain 09/11/2015  . Boil 06/06/2015  . Right leg pain 06/05/2015  . Syncope 04/08/2015  . Irregular menstrual cycle 07/04/2014  . Essential hypertension, benign 12/12/2013  . Morbid obesity ([place]) 05/25/2013  . Moderate nonproliferative diabetic retinopathy ([place]) 05/24/2013  . Diabetic neuropathy, type II diabetes mellitus ([place]) 05/23/2013  . Toenail fungus 01/17/2013  . Type II diabetes mellitus with complication, uncontrolled ([place]) 12/27/2012  . Depression 12/27/2012    Medications: reviewed and updated Current Outpatient Prescriptions  Medication Sig Dispense Refill  . ACCU-CHEK FASTCLIX LANCETS MISC Check sugar 5 x daily 204 each 3  . aspirin 325 MG tablet Take 650 mg by mouth daily as needed for mild pain.    . Blood Glucose Monitoring Suppl (ACCU-CHEK AVIVA CONNECT) w/Device KIT 50 Units by Does not apply route 2 (two) times daily. 1 kit 0  . cyclobenzaprine (FLEXERIL)  10 MG tablet Take 1 tablet (10 mg total) by mouth 2 (two) times daily as needed for muscle spasms. 20 tablet 0  . fluconazole (DIFLUCAN) 150 MG tablet Take 1 tablet (150 mg total) by mouth daily. (Patient not taking: Reported on 11/24/2015) 1 tablet 0  . glucose blood (ACCU-CHEK SMARTVIEW) test strip Check sugar 5 x daily 200 each 3  . HYDROcodone-acetaminophen (NORCO/VICODIN) 5-325 MG tablet Take 1-2 tablets by mouth every 6 (six) hours as needed. 7 tablet 0  . insulin aspart (NOVOLOG FLEXPEN) 100 UNIT/ML FlexPen INJECT 25 UNITS SUBCUTANEOUSLY THREE TIMES DAILY WITH MEALS 15 pen 11  . insulin detemir (LEVEMIR) 100 UNIT/ML injection Inject 0.5 mLs (50 Units total) into the skin 2 (two) times daily. 10 mL 11  . Insulin Pen Needle (B-D ULTRAFINE III SHORT PEN) 31G X 8 MM MISC 1 Container by Does not apply route as needed. 1 each 11  . Insulin Syringe-Needle U-100 (ULTICARE INSULIN SYRINGE) 31G X 5/16" 1 ML MISC Use to inject insulin. 200 each 1  . naproxen (NAPROSYN) 500 MG tablet Take 1 tablet (500 mg total) by mouth 2 (two) times daily. 20 tablet 0   No current facility-administered medications for this visit.     Social Hx:  reports that she has never smoked. She has never used smokeless tobacco.   Objective:   There were no vitals taken for this visit. Physical Exam  Gen: NAD, alert, cooperative with exam,  well-appearing HEENT: NCAT, PERRL, clear conjunctiva, oropharynx clear, supple neck Cardiac: Regular rate and rhythm, normal S1/S2, no murmur, no edema, capillary refill brisk  Respiratory: Clear to auscultation bilaterally, no wheezes, non-labored breathing Gastrointestinal: soft, non tender, non distended, bowel sounds present Skin: no rashes, normal turgor  Neurological: no gross deficits.  Psych: good insight, normal mood and affect   Assessment & Plan:  No problem-specific Assessment & Plan notes found for this encounter.   Smitty Cords, MD [place], PGY-2

## 2016-02-06 ENCOUNTER — Encounter (HOSPITAL_COMMUNITY): Payer: Self-pay | Admitting: *Deleted

## 2016-02-06 ENCOUNTER — Ambulatory Visit (HOSPITAL_COMMUNITY)
Admission: EM | Admit: 2016-02-06 | Discharge: 2016-02-06 | Disposition: A | Discharge status: Home / Self Care | Payer: Medicaid Other | Attending: Family Medicine | Admitting: Family Medicine

## 2016-02-06 DIAGNOSIS — R739 Hyperglycemia, unspecified: Secondary | ICD-10-CM

## 2016-02-06 DIAGNOSIS — R42 Dizziness and giddiness: Secondary | ICD-10-CM | POA: Diagnosis not present

## 2016-02-06 LAB — GLUCOSE, CAPILLARY: Glucose-Capillary: 271 mg/dL — ABNORMAL HIGH (ref 65–99)

## 2016-02-06 MED ORDER — MECLIZINE HCL 12.5 MG PO TABS: 12.5000 mg | ORAL_TABLET | Freq: Three times a day (TID) | ORAL | 0 refills | Status: DC | PRN

## 2016-02-06 NOTE — ED Provider Notes (Signed)
CSN: [id number]     Arrival date & time 02/06/16  1410 History   None    Chief Complaint  Patient presents with  . Dizziness   (Consider location/radiation/quality/duration/timing/severity/associated sxs/prior Treatment) 31 year old female presents to clinic with chief complaint of weakness and dizziness and blurred vision. She reports she has not been checking her blood sugars in about a week, she reports increased thirst, no change in urination, no change in hot or cold tolerance. She has not had palpitations or other cardiac symptoms. Her dizziness is not affected by movement or standing.   The history is provided by the patient.  Dizziness    Past Medical History:  Diagnosis Date  . Diabetes mellitus    Type 2, insulin resistant   Past Surgical History:  Procedure Laterality Date  . CERVICAL BIOPSY  2004  . CESAREAN SECTION N/A 06/02/2012   Procedure: CESAREAN SECTION;  Surgeon: Mora Bellman, MD;  Location: [facility];  Service: Obstetrics;  Laterality: N/A;   Family History  Problem Relation Age of Onset  . Diabetes Father    Social History  Substance Use Topics  . Smoking status: Never Smoker  . Smokeless tobacco: Never Used  . Alcohol use No   OB History    Gravida Para Term Preterm AB Living   '1 1 1     1   '$ SAB TAB Ectopic Multiple Live Births           1     Review of Systems  Reason unable to perform ROS: as covered in HPI.  Neurological: Positive for dizziness.  All other systems reviewed and are negative.   Allergies  Patient has no known allergies.  Home Medications   Prior to Admission medications   Medication Sig Start Date End Date Taking? Authorizing Provider  ACCU-CHEK FASTCLIX LANCETS MISC Check sugar 5 x daily 12/08/15   Carlyle Dolly, MD  aspirin 325 MG tablet Take 650 mg by mouth daily as needed for mild pain.    Historical Provider, MD  Blood Glucose Monitoring Suppl (ACCU-CHEK AVIVA CONNECT) w/Device KIT 50 Units by Does not apply  route 2 (two) times daily. 04/11/15   Rosemarie Ax, MD  cyclobenzaprine (FLEXERIL) 10 MG tablet Take 1 tablet (10 mg total) by mouth 2 (two) times daily as needed for muscle spasms. 12/20/15   Montine Circle, PA-C  fluconazole (DIFLUCAN) 150 MG tablet Take 1 tablet (150 mg total) by mouth daily. Patient not taking: Reported on 11/24/2015 09/12/15   Bjorn Pippin, PA-C  glucose blood (ACCU-CHEK SMARTVIEW) test strip Check sugar 5 x daily 12/08/15   Carlyle Dolly, MD  HYDROcodone-acetaminophen (NORCO/VICODIN) 5-325 MG tablet Take 1-2 tablets by mouth every 6 (six) hours as needed. 12/20/15   Montine Circle, PA-C  insulin aspart (NOVOLOG FLEXPEN) 100 UNIT/ML FlexPen INJECT 25 UNITS SUBCUTANEOUSLY THREE TIMES DAILY WITH MEALS 12/08/15   Carlyle Dolly, MD  insulin detemir (LEVEMIR) 100 UNIT/ML injection Inject 0.5 mLs (50 Units total) into the skin 2 (two) times daily. 12/08/15   Carlyle Dolly, MD  Insulin Pen Needle (B-D ULTRAFINE III SHORT PEN) 31G X 8 MM MISC 1 Container by Does not apply route as needed. 12/08/15   Carlyle Dolly, MD  Insulin Syringe-Needle U-100 Flossie Buffy INSULIN SYRINGE) 31G X 5/16" 1 ML MISC Use to inject insulin. 12/08/15   Carlyle Dolly, MD  meclizine (ANTIVERT) 12.5 MG tablet Take 1 tablet (12.5 mg total) by mouth 3 (three)  times daily as needed for dizziness. 02/06/16   Barnet Glasgow, NP  naproxen (NAPROSYN) 500 MG tablet Take 1 tablet (500 mg total) by mouth 2 (two) times daily. 11/08/15   Hope Bunnie Pion, NP   Meds Ordered and Administered this Visit  Medications - No data to display  BP 142/78 (BP Location: Right Arm)   Pulse 88   Temp 98.9 F (37.2 C) (Oral)   Resp 18   LMP 01/20/2016   SpO2 100%  No data found.   Physical Exam  Constitutional: She is oriented to person, place, and time. She appears well-developed and well-nourished. No distress.  HENT:  Head: Normocephalic and atraumatic.  Right Ear: Tympanic membrane and  external ear normal.  Left Ear: Tympanic membrane and external ear normal.  Nose: Nose normal.  Mouth/Throat: Uvula is midline, oropharynx is clear and moist and mucous membranes are normal.  Eyes: Conjunctivae and EOM are normal. Pupils are equal, round, and reactive to light.  Neck: Normal range of motion. Neck supple. No JVD present.  Cardiovascular: Normal rate and regular rhythm.   Pulmonary/Chest: Effort normal and breath sounds normal.  Abdominal: Soft. Bowel sounds are normal.  Lymphadenopathy:    She has no cervical adenopathy.  Neurological: She is alert and oriented to person, place, and time.  Skin: Skin is warm and dry. Capillary refill takes less than 2 seconds. She is not diaphoretic.  Psychiatric: She has a normal mood and affect.  Nursing note and vitals reviewed.   Urgent Care Course     Procedures (including critical care time)  Labs Review Labs Reviewed  GLUCOSE, CAPILLARY - Abnormal; Notable for the following:       Result Value   Glucose-Capillary 271 (*)    All other components within normal limits    Imaging Review No results found.   Visual Acuity Review  Right Eye Distance:   Left Eye Distance:   Bilateral Distance:    Right Eye Near:   Left Eye Near:    Bilateral Near:         MDM   1. Dizziness   2. Hyperglycemia   Your blood sugar today is rather high, it is 271 mg/dl. This may be contributing to your symptoms of dizziness. I would recommend following up with your primary care provider as soon as possible for management of your diabetes. I would also recommend you replace your blood sugar monitor since it has been malfunctioning. I have written a prescription that may help with your dizziness. It is called Antivert, take 1 tablet three times a day as needed. This medicine may cause drowsiness. Should your symptoms persist, follow up with your primary care provider or return to clinic.    Barnet Glasgow, NP 02/06/16 [phone]

## 2016-02-06 NOTE — ED Triage Notes (Signed)
pT  IS  DIABETIC    TAKING  MEDS   NOT  CHECKING  SUGARS     C/O  DIZZY   BLURRED  VISION     FATIGUE   AND  LOSES  BALANCE

## 2016-02-06 NOTE — Discharge Instructions (Signed)
Your blood sugar today is rather high, it is 271 mg/dl. This may be contributing to your symptoms of dizziness. I would recommend following up with your primary care provider as soon as possible for management of your diabetes. I would also recommend you replace your blood sugar monitor since it has been malfunctioning. I have written a prescription that may help with your dizziness. It is called Antivert, take 1 tablet three times a day as needed. This medicine may cause drowsiness. Should your symptoms persist, follow up with your primary care provider or return to clinic.

## 2016-02-10 ENCOUNTER — Ambulatory Visit (INDEPENDENT_AMBULATORY_CARE_PROVIDER_SITE_OTHER): Payer: Medicaid Other | Admitting: Family Medicine

## 2016-02-10 ENCOUNTER — Encounter: Payer: Self-pay | Admitting: Family Medicine

## 2016-02-10 VITALS — BP 128/82 | HR 103 | Temp 98.5°F | Ht 62.0 in | Wt 227.0 lb

## 2016-02-10 DIAGNOSIS — I10 Essential (primary) hypertension: Secondary | ICD-10-CM | POA: Diagnosis not present

## 2016-02-10 DIAGNOSIS — E1140 Type 2 diabetes mellitus with diabetic neuropathy, unspecified: Secondary | ICD-10-CM | POA: Diagnosis not present

## 2016-02-10 DIAGNOSIS — M25521 Pain in right elbow: Secondary | ICD-10-CM

## 2016-02-10 DIAGNOSIS — B373 Candidiasis of vulva and vagina: Secondary | ICD-10-CM | POA: Diagnosis not present

## 2016-02-10 DIAGNOSIS — E118 Type 2 diabetes mellitus with unspecified complications: Secondary | ICD-10-CM | POA: Diagnosis not present

## 2016-02-10 MED ORDER — NAPROXEN 500 MG PO TABS: 500.0000 mg | ORAL_TABLET | Freq: Two times a day (BID) | ORAL | 1 refills | Status: DC | PRN

## 2016-02-10 NOTE — Patient Instructions (Signed)
It was good to you today.  I have refilled the naproxen for you with a refill on there as well.  Head over to the imaging center and they will get an x-ray of your elbow. You do not need an appointment. I will call you with the results.  You should continue using ice and movement in the joint as much as possible. You should start feeling better the next couple days.
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
## 2016-02-10 NOTE — Assessment & Plan Note (Signed)
This seems most likely to be contusion/bruise of her lateral epicondyle. We are getting an x-ray today as she does have some tenderness directly over the lateral epicondyle. Likely will be better with naproxen and continue ice. Recommended to continue with movement in that arm. Follow-up in one week if output. Follow-up sooner if worsening.

## 2016-02-12 ENCOUNTER — Ambulatory Visit
Admission: RE | Admit: 2016-02-12 | Discharge: 2016-02-12 | Disposition: A | Discharge status: Home / Self Care | Payer: Medicaid Other | Source: Ambulatory Visit | Attending: Family Medicine | Admitting: Family Medicine

## 2016-02-12 DIAGNOSIS — M25521 Pain in right elbow: Secondary | ICD-10-CM

## 2016-02-13 ENCOUNTER — Telehealth: Payer: Self-pay | Admitting: Family Medicine

## 2016-02-13 ENCOUNTER — Ambulatory Visit: Payer: Medicaid Other | Admitting: Family Medicine

## 2016-02-13 NOTE — Telephone Encounter (Signed)
Pt called again for x -ray results.

## 2016-02-13 NOTE — Telephone Encounter (Signed)
Pt informed. Zimmerman Rumple, Izabel Chim D, CMA  

## 2016-02-13 NOTE — Telephone Encounter (Signed)
Please call patient to tell her Xray of elbow was normal. She should follow up in one week as Dr. Gwendolyn GrantWalden suggested. She should continue with Naproxen, ice, and arm movement as he advised.   Marcy Sirenatherine Dyon Rotert, D.O. 02/13/2016, 12:57 PM PGY-2, [facility]

## 2016-02-13 NOTE — Telephone Encounter (Signed)
Pt would like someone to call her with x-ray results. ep °

## 2016-03-03 ENCOUNTER — Ambulatory Visit: Payer: Medicaid Other | Admitting: Family Medicine

## 2016-04-21 ENCOUNTER — Telehealth: Payer: Self-pay | Admitting: Family Medicine

## 2016-04-21 NOTE — Telephone Encounter (Signed)
Sched 15 min appt 06/01/16. - Karina [name]

## 2016-04-23 LAB — HM DIABETES EYE EXAM

## 2016-05-24 ENCOUNTER — Encounter: Payer: Self-pay | Admitting: Family Medicine

## 2016-05-24 ENCOUNTER — Other Ambulatory Visit (HOSPITAL_COMMUNITY)
Admission: RE | Admit: 2016-05-24 | Discharge: 2016-05-24 | Disposition: A | Discharge status: Home / Self Care | Payer: Medicaid Other | Source: Ambulatory Visit | Attending: Family Medicine | Admitting: Family Medicine

## 2016-05-24 ENCOUNTER — Ambulatory Visit (INDEPENDENT_AMBULATORY_CARE_PROVIDER_SITE_OTHER): Payer: Medicaid Other | Admitting: Family Medicine

## 2016-05-24 VITALS — BP 124/80 | HR 85 | Temp 98.3°F | Ht 62.0 in | Wt 228.0 lb

## 2016-05-24 DIAGNOSIS — Z124 Encounter for screening for malignant neoplasm of cervix: Secondary | ICD-10-CM

## 2016-05-24 DIAGNOSIS — Z0000 Encounter for general adult medical examination without abnormal findings: Secondary | ICD-10-CM | POA: Diagnosis not present

## 2016-05-24 DIAGNOSIS — IMO0002 Reserved for concepts with insufficient information to code with codable children: Secondary | ICD-10-CM

## 2016-05-24 DIAGNOSIS — E1165 Type 2 diabetes mellitus with hyperglycemia: Secondary | ICD-10-CM

## 2016-05-24 DIAGNOSIS — Z794 Long term (current) use of insulin: Secondary | ICD-10-CM | POA: Diagnosis not present

## 2016-05-24 DIAGNOSIS — E118 Type 2 diabetes mellitus with unspecified complications: Secondary | ICD-10-CM

## 2016-05-24 LAB — POCT GLYCOSYLATED HEMOGLOBIN (HGB A1C): HEMOGLOBIN A1C: 14

## 2016-05-24 MED ORDER — METFORMIN HCL ER 500 MG PO TB24: 500.0000 mg | ORAL_TABLET | Freq: Every day | ORAL | 3 refills | Status: DC

## 2016-05-24 MED ORDER — INSULIN PEN NEEDLE 31G X 8 MM MISC: 1.0000 | 11 refills | Status: DC | PRN

## 2016-05-24 MED ORDER — ACCU-CHEK FASTCLIX LANCETS MISC: 3 refills | Status: DC

## 2016-05-24 MED ORDER — "INSULIN SYRINGE-NEEDLE U-100 31G X 5/16"" 1 ML MISC": 1 refills | Status: DC

## 2016-05-24 MED ORDER — FLUCONAZOLE 150 MG PO TABS: 150.0000 mg | ORAL_TABLET | Freq: Once | ORAL | 0 refills | Status: AC
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
## 2016-05-24 NOTE — Assessment & Plan Note (Addendum)
Uncontrolled. Hgb A1c 14 today, previously 13.5. Continues to be inconsistent with her insulin regimen. -Counseled extensively on the importance of taking her insulin consistently -Continue Levemir 50 units twice a day -Continue 25 units of NovoLog 3 times a day with meals -We will retry metformin at a lower dose, started metformin 500 mg XR once daily. Can increase and next visit if she tolerates this. -Refilled all diabetic supplies -Patient will check her fasting glucose daily and nighttime glucose daily and bring in a record in 2 weeks so we can review -Foot exam performed today -Check urine microalbumin in 2 weeks -Follow-up in 2 weeks

## 2016-05-24 NOTE — Patient Instructions (Signed)
Thank you for coming in today, it was so nice to see you! Today we talked about:    Diabetes: Your sugars are dangerously high. We will need to keep a close eye on them. Please take your Levemir and Novolog as prescribed. Drink plenty of water until your pee is clear to light yellow. I have sent a prescription for Metformin to your pharmacy. Please take 1 pill daily with breakfast. Check your glucose every morning before breakfast and write down your number. If possible, please bring in your glucometer to your next visit so we can review the results.   I think that you would greatly benefit from seeing a nutritionist.  Please call Dr Gerilyn PilgrimSykes at [phone] to schedule an appointment.  Please follow up in 2-3 weeks for diabetes follow up. You can schedule this appointment at the front desk before you leave or call the clinic.  If we ordered any tests today, you will be notified via telephone of any abnormalities. If everything is normal you will get a letter in the mail.   If you have any questions or concerns, please do not hesitate to call the office at [phone]. You can also message me directly via MyChart.   Sincerely,  Anders Simmondshristina Deyjah Kindel, MD

## 2016-05-25 NOTE — Assessment & Plan Note (Addendum)
Besides uncontrolled diabetes, patient otherwise well. Pap smear performed today  -return in 1 year for next wellness exam

## 2016-05-27 ENCOUNTER — Encounter: Payer: Self-pay | Admitting: Family Medicine

## 2016-05-27 LAB — CYTOLOGY - PAP
Diagnosis: NEGATIVE
HPV (WINDOPATH): NOT DETECTED

## 2016-06-01 ENCOUNTER — Ambulatory Visit: Payer: Medicaid Other | Admitting: Family Medicine

## 2016-06-18 ENCOUNTER — Ambulatory Visit: Payer: Medicaid Other | Admitting: Family Medicine

## 2016-06-25 ENCOUNTER — Telehealth: Payer: Self-pay | Admitting: Family Medicine

## 2016-06-25 NOTE — Telephone Encounter (Signed)
Sched DM f/u 07/21/16 with PCP - Karina [name]

## 2016-07-21 ENCOUNTER — Ambulatory Visit: Payer: Medicaid Other | Admitting: Family Medicine

## 2016-07-21 NOTE — Progress Notes (Deleted)
Subjective:    Patient ID: Karina [name] , female   DOB: 13-Jan-1985 , 31 y.o..   MRN: [id number]  HPI  Karina [name] is here for No chief complaint on file.   1.  Chronic Diabetes  Disease Monitoring  Blood Sugar Ranges: ***  Polyuria: {YES/NO/WILD CARDS:18581}   Visual problems: {YES/NO/WILD CARDS:18581}   Last hemoglobin A1C:  Lab Results  Component Value Date   HGBA1C 14.0 05/24/2016    Medication Compliance: {YES/NO/WILD CARDS:18581}  Medication Side Effects  Hypoglycemia: {YES/NO/WILD DPOEU:23536}   Preventitive Health Care  Eye Exam: ***  Foot Exam: ***  Diet pattern: ***  Exercise: ***  -Continue Levemir 50 units twice a day -Continue 25 units of NovoLog 3 times a day with meals -We will retry metformin at a lower dose, started metformin 500 mg XR once daily.  Review of Systems: Per HPI. All other systems reviewed and are negative.  Health Maintenance Due  Topic Date Due  . PNEUMOCOCCAL POLYSACCHARIDE VACCINE (1) 07/31/1985  . URINE MICROALBUMIN  05/31/2014    Past Medical History: Patient Active Problem List   Diagnosis Date Noted  . Right elbow pain 02/10/2016  . Contusion of right foot 11/24/2015  . Back pain 09/11/2015  . Boil 06/06/2015  . Right leg pain 06/05/2015  . Syncope 04/08/2015  . Irregular menstrual cycle 07/04/2014  . Essential hypertension, benign 12/12/2013  . Annual physical exam 06/27/2013  . Morbid obesity ([place]) 05/25/2013  . Moderate nonproliferative diabetic retinopathy ([place]) 05/24/2013  . Diabetic neuropathy, type II diabetes mellitus ([place]) 05/23/2013  . Toenail fungus 01/17/2013  . Type II diabetes mellitus with complication, uncontrolled ([place]) 12/27/2012  . Depression 12/27/2012    Medications: reviewed and updated Current Outpatient Prescriptions  Medication Sig Dispense Refill  . ACCU-CHEK FASTCLIX LANCETS MISC Check sugar 5 x daily 204 each 3  . Blood Glucose Monitoring Suppl (ACCU-CHEK AVIVA CONNECT)  w/Device KIT 50 Units by Does not apply route 2 (two) times daily. 1 kit 0  . glucose blood (ACCU-CHEK SMARTVIEW) test strip Check sugar 5 x daily 200 each 3  . insulin aspart (NOVOLOG FLEXPEN) 100 UNIT/ML FlexPen INJECT 25 UNITS SUBCUTANEOUSLY THREE TIMES DAILY WITH MEALS 15 pen 11  . insulin detemir (LEVEMIR) 100 UNIT/ML injection Inject 0.5 mLs (50 Units total) into the skin 2 (two) times daily. 10 mL 11  . Insulin Pen Needle (B-D ULTRAFINE III SHORT PEN) 31G X 8 MM MISC 1 Container by Does not apply route as needed. 1 each 11  . Insulin Syringe-Needle U-100 (ULTICARE INSULIN SYRINGE) 31G X 5/16" 1 ML MISC Use to inject insulin. 200 each 1  . meclizine (ANTIVERT) 12.5 MG tablet Take 1 tablet (12.5 mg total) by mouth 3 (three) times daily as needed for dizziness. (Patient not taking: Reported on 05/24/2016) 30 tablet 0  . metFORMIN (GLUCOPHAGE XR) 500 MG 24 hr tablet Take 1 tablet (500 mg total) by mouth daily with breakfast. 30 tablet 3  . naproxen (NAPROSYN) 500 MG tablet Take 1 tablet (500 mg total) by mouth 2 (two) times daily as needed. (Patient not taking: Reported on 05/24/2016) 30 tablet 1   No current facility-administered medications for this visit.     Social Hx:  reports that she has never smoked. She has never used smokeless tobacco.   Objective:   There were no vitals taken for this visit. Physical Exam  Gen: NAD, alert, cooperative with exam, well-appearing HEENT: NCAT, PERRL, clear conjunctiva, oropharynx clear, supple neck  Cardiac: Regular rate and rhythm, normal S1/S2, no murmur, no edema, capillary refill brisk  Respiratory: Clear to auscultation bilaterally, no wheezes, non-labored breathing Gastrointestinal: soft, non tender, non distended, bowel sounds present Skin: no rashes, normal turgor  Neurological: no gross deficits.  Psych: good insight, normal mood and affect  Assessment & Plan:  No problem-specific Assessment & Plan notes found for this encounter.  No  orders of the defined types were placed in this encounter.  No orders of the defined types were placed in this encounter.   Smitty Cords, MD [place], PGY-3

## 2016-08-15 NOTE — Progress Notes (Signed)
Subjective:    Patient ID: Karina [name] , female   DOB: Feb 02, 1985 , 31 y.o..   MRN: [id number]  HPI  Karina [name] is here for  Chief Complaint  Patient presents with  . Diabetes  . vitamin D levels  . bariatric information    1. Chronic Diabetes  Disease Monitoring  Blood Sugar Ranges: Does not check regularly but thinks in the low 200s when she does check  Polyuria: no   Visual problems: no   Last hemoglobin A1C:  Lab Results  Component Value Date   HGBA1C 13.6 08/16/2016    Medication Compliance: no, she is supposed to take Levemir 50 units twice a day, 25 units of NovoLog 3 times a day with meals, metformin 500 mg. She does not take metformin due to GI upset. She is not consistently taking her Levemir. She has been taking a decreased dose of her NovoLog at about 15 units 3 times a day instead. Medication Side Effects  Hypoglycemia: no   Preventitive Health Care  Eye Exam: up to date  Foot Exam: up to date  Diet pattern: patient as she has been decreasing carbohydrates from her diet  Exercise: limited  2. Desire for bariatric surgery: Patient notes that she started the process of getting bariatric surgery last year but "fell off the bandwagon". She notes that they started her on some pillows started with her at that did not make her feel good. She notes that she wants to get the ball rolling again to have bariatric surgery. She has proved that she has been able to lose some weight as she has been eating healthier.  3. Wants vitamin D checked: Patient notes that she has heard that vitamin D deficiency can make you more tired. She endorses that sometimes she is more tired than she would expect after getting sleep and she would like to get her right checked.  Review of Systems: Per HPI.   Past Medical History: Patient Active Problem List   Diagnosis Date Noted  . Encounter for vitamin deficiency screening 08/16/2016  . Right elbow pain 02/10/2016  . Contusion  of right foot 11/24/2015  . Back pain 09/11/2015  . Boil 06/06/2015  . Right leg pain 06/05/2015  . Syncope 04/08/2015  . Irregular menstrual cycle 07/04/2014  . Essential hypertension, benign 12/12/2013  . Annual physical exam 06/27/2013  . Morbid obesity ([place]) 05/25/2013  . Moderate nonproliferative diabetic retinopathy ([place]) 05/24/2013  . Diabetic neuropathy, type II diabetes mellitus ([place]) 05/23/2013  . Toenail fungus 01/17/2013  . Type II diabetes mellitus with complication, uncontrolled ([place]) 12/27/2012  . Depression 12/27/2012    Medications: reviewed and updated Current Outpatient Prescriptions  Medication Sig Dispense Refill  . ACCU-CHEK FASTCLIX LANCETS MISC Check sugar 5 x daily 204 each 3  . Blood Glucose Monitoring Suppl (ACCU-CHEK AVIVA CONNECT) w/Device KIT 50 Units by Does not apply route 2 (two) times daily. 1 kit 0  . glucose blood (ACCU-CHEK SMARTVIEW) test strip Check sugar 5 x daily 200 each 3  . insulin aspart (NOVOLOG FLEXPEN) 100 UNIT/ML FlexPen INJECT 25 UNITS SUBCUTANEOUSLY THREE TIMES DAILY WITH MEALS 15 pen 11  . Insulin Glargine (LANTUS SOLOSTAR) 100 UNIT/ML Solostar Pen Inject 50 Units into the skin 2 (two) times daily. 5 pen PRN  . Insulin Pen Needle (B-D ULTRAFINE III SHORT PEN) 31G X 8 MM MISC 1 Container by Does not apply route as needed. 1 each 11  . Insulin Syringe-Needle U-100 (ULTICARE INSULIN  SYRINGE) 31G X 5/16" 1 ML MISC Use to inject insulin. 200 each 1  . meclizine (ANTIVERT) 12.5 MG tablet Take 1 tablet (12.5 mg total) by mouth 3 (three) times daily as needed for dizziness. (Patient not taking: Reported on 05/24/2016) 30 tablet 0  . naproxen (NAPROSYN) 500 MG tablet Take 1 tablet (500 mg total) by mouth 2 (two) times daily as needed. (Patient not taking: Reported on 05/24/2016) 30 tablet 1   No current facility-administered medications for this visit.     Social Hx:  reports that she has never smoked. She has never used smokeless tobacco.     Objective:   BP 125/80 (BP Location: Left Arm, Patient Position: Sitting, Cuff Size: Normal)   Pulse 100   Temp 98.2 F (36.8 C) (Oral)   Ht _0  (1.575 m)   Wt 221 lb 3.2 oz (100.3 kg)   LMP  (LMP Unknown)   SpO2 96%   BMI 40.46 kg/m  Physical Exam  Gen: NAD, alert, cooperative with exam, well-appearing Cardiac: Regular rate and rhythm Respiratory:  non-labored breathing Psych: good insight, normal mood and affect  Assessment & Plan:  Type II diabetes mellitus with complication, uncontrolled ([place]) Uncontrolled. Hemoglobin A1c 13.6 today, previously 14. Continues to be inconsistent with her insulin regimen. Additionally admitted that she lost her glucometer and has not been checking her glucose regularly. Notes that barriers to proper management are not financial but instead external factors such as stress and not planning her time effectively to take her medications. Discussed patient with Dr. Valentina Lucks and the plan is as follows; -Stop Levemir and change patient to Lantus pen. Lantus and Levemir are one-to-one conversion therefore will do 50 units of Lantus twice a day -Continue NovoLog 25 units 3 times a day with meals -Encouraged patient to check her blood sugars, refilled her glucometer supplies -Patient to schedule appointment with Dr. Valentina Lucks for diabetic care -Follow-up with me a couple weeks after she sees Dr. Valentina Lucks  Morbid obesity ([place]) Body mass index is 40.46 kg/m. Patient interested in pursuing bariatric surgery. It is encouraging the patient has lost 7 pounds on last couple months just by modifying her diet. - Continued to encourage exercise and diet modification  -Referral to bariatric surgery placed  Encounter for vitamin deficiency screening Patient would like to have her vitamin D level checked. Does not appear to have any risk factors for low vitamin D. -Vitamin D level checked per patient request  Orders Placed This Encounter  Procedures  . VITAMIN D 25 Hydroxy  (Vit-D Deficiency, Fractures)  . Amb Referral to Bariatric Surgery    Referral Priority:   Routine    Referral Type:   Consultation    Number of Visits Requested:   1  . HgB A1c    Smitty Cords, MD [place], PGY-3

## 2016-08-16 ENCOUNTER — Ambulatory Visit (INDEPENDENT_AMBULATORY_CARE_PROVIDER_SITE_OTHER): Payer: Medicaid Other | Admitting: Family Medicine

## 2016-08-16 VITALS — BP 125/80 | HR 100 | Temp 98.2°F | Ht 62.0 in | Wt 221.2 lb

## 2016-08-16 DIAGNOSIS — E1165 Type 2 diabetes mellitus with hyperglycemia: Secondary | ICD-10-CM

## 2016-08-16 DIAGNOSIS — E1140 Type 2 diabetes mellitus with diabetic neuropathy, unspecified: Secondary | ICD-10-CM

## 2016-08-16 DIAGNOSIS — E118 Type 2 diabetes mellitus with unspecified complications: Secondary | ICD-10-CM

## 2016-08-16 DIAGNOSIS — Z794 Long term (current) use of insulin: Secondary | ICD-10-CM

## 2016-08-16 DIAGNOSIS — Z1321 Encounter for screening for nutritional disorder: Secondary | ICD-10-CM

## 2016-08-16 DIAGNOSIS — IMO0002 Reserved for concepts with insufficient information to code with codable children: Secondary | ICD-10-CM

## 2016-08-16 LAB — POCT GLYCOSYLATED HEMOGLOBIN (HGB A1C): Hemoglobin A1C: 13.6

## 2016-08-16 MED ORDER — GLUCOSE BLOOD VI STRP: ORAL_STRIP | 3 refills | Status: DC

## 2016-08-16 MED ORDER — ACCU-CHEK FASTCLIX LANCETS MISC: 3 refills | Status: DC

## 2016-08-16 MED ORDER — INSULIN PEN NEEDLE 31G X 8 MM MISC: 1.0000 | 11 refills | Status: DC | PRN

## 2016-08-16 MED ORDER — INSULIN ASPART 100 UNIT/ML FLEXPEN: PEN_INJECTOR | SUBCUTANEOUS | 11 refills | Status: DC

## 2016-08-16 MED ORDER — "INSULIN SYRINGE-NEEDLE U-100 31G X 5/16"" 1 ML MISC": 1 refills | Status: DC

## 2016-08-16 MED ORDER — INSULIN GLARGINE 100 UNIT/ML SOLOSTAR PEN: 50.0000 [IU] | PEN_INJECTOR | Freq: Two times a day (BID) | SUBCUTANEOUS | 99 refills | Status: DC

## 2016-08-16 MED ORDER — ACCU-CHEK AVIVA CONNECT W/DEVICE KIT: 50.0000 [IU] | PACK | Freq: Two times a day (BID) | 0 refills | Status: DC

## 2016-08-16 NOTE — Assessment & Plan Note (Addendum)
Body mass index is 40.46 kg/m. Patient interested in pursuing bariatric surgery. It is encouraging the patient has lost 7 pounds on last couple months just by modifying her diet. - Continued to encourage exercise and diet modification  -Referral to bariatric surgery placed

## 2016-08-16 NOTE — Assessment & Plan Note (Signed)
Patient would like to have her vitamin D level checked. Does not appear to have any risk factors for low vitamin D. -Vitamin D level checked per patient request

## 2016-08-16 NOTE — Patient Instructions (Signed)
Thank you for coming in today, it was so nice to see you! Today we talked about:    Diabetes: We have stopped the levemir. We will now do Lantus 50 units twice daily. Continue novolog 25 units three times a day with meals. If you have sugars below 100, please call be and decrease your lantus to 45 units twice daily instead.   Congratulations on losing 7 lbs!!!!  Please schedule an appt with Dr. Raymondo BandKoval our pharmacist to discuss diabetes  We have checked your vitamin D levels today  I have placed a referral for bariatric surgery. Someone will call you to schedule this.   Please follow up in 1 month. You can schedule this appointment at the front desk before you leave or call the clinic.  If we ordered any tests today, you will be notified via telephone of any abnormalities. If everything is normal you will get a letter in the mail.   If you have any questions or concerns, please do not hesitate to call the office at [phone]. You can also message me directly via MyChart.   Sincerely,  Karina Simmondshristina Kobe Jansma, MD

## 2016-08-16 NOTE — Assessment & Plan Note (Addendum)
Uncontrolled. Hemoglobin A1c 13.6 today, previously 14. Continues to be inconsistent with her insulin regimen. Additionally admitted that she lost her glucometer and has not been checking her glucose regularly. Notes that barriers to proper management are not financial but instead external factors such as stress and not planning her time effectively to take her medications. Discussed patient with Dr. Raymondo BandKoval and the plan is as follows; -Stop Levemir and change patient to Lantus pen. Lantus and Levemir are one-to-one conversion therefore will do 50 units of Lantus twice a day -Continue NovoLog 25 units 3 times a day with meals -Encouraged patient to check her blood sugars, refilled her glucometer supplies -Patient to schedule appointment with Dr. Raymondo BandKoval for diabetic care -Follow-up with me a couple weeks after she sees Dr. Raymondo BandKoval

## 2016-08-17 LAB — VITAMIN D 25 HYDROXY (VIT D DEFICIENCY, FRACTURES): VIT D 25 HYDROXY: 6.8 ng/mL — AB (ref 30.0–100.0)

## 2016-08-19 ENCOUNTER — Telehealth: Payer: Self-pay | Admitting: Family Medicine

## 2016-08-19 ENCOUNTER — Other Ambulatory Visit: Payer: Self-pay | Admitting: Family Medicine

## 2016-08-19 DIAGNOSIS — Z113 Encounter for screening for infections with a predominantly sexual mode of transmission: Secondary | ICD-10-CM

## 2016-08-19 MED ORDER — CHOLECALCIFEROL 1.25 MG (50000 UT) PO CAPS: 50000.0000 [IU] | ORAL_CAPSULE | ORAL | 0 refills | Status: DC

## 2016-08-19 NOTE — Telephone Encounter (Signed)
Called patient to discuss low Vitamin D. Will send rx for Vit D.   Patient also wanting STD screening. Will order future order for HIV, RPR, and urine GC/Chlamydia. Asked her to also make an appt to discuss this.   Anders Simmondshristina Gambino, MD [facility], PGY-3

## 2016-08-23 ENCOUNTER — Ambulatory Visit: Payer: Medicaid Other | Admitting: Pharmacist

## 2016-09-01 ENCOUNTER — Telehealth: Payer: Self-pay | Admitting: Family Medicine

## 2016-09-01 NOTE — Telephone Encounter (Signed)
Pt is calling because she said that she forgot to tell the doctor to call in her Vitamin D and her meter, jw

## 2016-09-03 MED ORDER — CHOLECALCIFEROL 1.25 MG (50000 UT) PO CAPS: 50000.0000 [IU] | ORAL_CAPSULE | ORAL | 0 refills | Status: DC

## 2016-09-03 MED ORDER — ACCU-CHEK AVIVA CONNECT W/DEVICE KIT: 50.0000 [IU] | PACK | Freq: Two times a day (BID) | 0 refills | Status: DC

## 2016-09-03 NOTE — Telephone Encounter (Signed)
Rx sent to pharmacy   

## 2016-12-13 ENCOUNTER — Telehealth: Payer: Self-pay | Admitting: Family Medicine

## 2016-12-13 DIAGNOSIS — E118 Type 2 diabetes mellitus with unspecified complications: Secondary | ICD-10-CM

## 2016-12-13 DIAGNOSIS — IMO0002 Reserved for concepts with insufficient information to code with codable children: Secondary | ICD-10-CM

## 2016-12-13 DIAGNOSIS — E1165 Type 2 diabetes mellitus with hyperglycemia: Secondary | ICD-10-CM

## 2016-12-13 DIAGNOSIS — Z794 Long term (current) use of insulin: Principal | ICD-10-CM

## 2016-12-13 MED ORDER — GLUCOSE BLOOD VI STRP: ORAL_STRIP | 3 refills | Status: DC

## 2016-12-13 MED ORDER — INSULIN ASPART 100 UNIT/ML FLEXPEN: PEN_INJECTOR | SUBCUTANEOUS | 0 refills | Status: DC

## 2016-12-13 MED ORDER — ACCU-CHEK FASTCLIX LANCETS MISC: 3 refills | Status: DC

## 2016-12-13 MED ORDER — INSULIN GLARGINE 100 UNIT/ML SOLOSTAR PEN: 50.0000 [IU] | PEN_INJECTOR | Freq: Two times a day (BID) | SUBCUTANEOUS | 0 refills | Status: DC

## 2016-12-13 MED ORDER — ACCU-CHEK AVIVA CONNECT W/DEVICE KIT: 50.0000 [IU] | PACK | Freq: Two times a day (BID) | 0 refills | Status: DC

## 2016-12-13 NOTE — Telephone Encounter (Signed)
Vitamin D, Novolog, and Levamir, and meter for Accucheck, strips and lancets, to [facility] [address].  Patient is out of or does not have these at this time and needs asap, thanks.  [phone] is best number to reach pt w/question.

## 2016-12-13 NOTE — Telephone Encounter (Signed)
Medications refilled except for Vit D.   Patient will need an appointment to recheck Vitamin D to see if she still needs to be on that medication. She also needs to come back and have her A1C checked for diabetes. She can come back on the same day to address both of these things.   White team please call patient and have her schedule an appt to see me for diabetes and vitamin d deficiency. Thank you.   Anders Simmondshristina Emmajane Altamura, MD [facility], PGY-3

## 2016-12-14 NOTE — Telephone Encounter (Signed)
Tried to call pt. Phone stopped ringing with no option to LVM. Sunday SpillersSharon T Saunders, CMA

## 2016-12-15 ENCOUNTER — Other Ambulatory Visit: Payer: Self-pay | Admitting: Family Medicine

## 2016-12-15 MED ORDER — ACCU-CHEK SOFTCLIX LANCET DEV KIT: 1.0000 "application " | PACK | Freq: Two times a day (BID) | 2 refills | Status: DC

## 2016-12-17 NOTE — Telephone Encounter (Signed)
Spoke to pt. Per pt she hasn't taken the vitamin D. She has been checking with [facility] [address] I looked at the Rx, [facility] [address]. Pt is asking if we can send the Rx to [facility] on [address] so she can take it. Please advise. Sunday SpillersSharon T Zyair Rhein, CMA

## 2016-12-21 MED ORDER — GLUCOSE BLOOD VI STRP: ORAL_STRIP | 3 refills | Status: DC

## 2016-12-21 MED ORDER — INSULIN GLARGINE 100 UNIT/ML SOLOSTAR PEN: 50.0000 [IU] | PEN_INJECTOR | Freq: Two times a day (BID) | SUBCUTANEOUS | 0 refills | Status: DC

## 2016-12-21 MED ORDER — ACCU-CHEK FASTCLIX LANCETS MISC: 3 refills | Status: DC

## 2016-12-21 MED ORDER — INSULIN ASPART 100 UNIT/ML FLEXPEN: PEN_INJECTOR | SUBCUTANEOUS | 0 refills | Status: DC

## 2016-12-21 NOTE — Telephone Encounter (Signed)
[facility] [address]

## 2016-12-21 NOTE — Addendum Note (Signed)
Addended by: Beaulah DinningGAMBINO, Ekta Dancer M on: 12/21/2016 10:00 PM   Modules accepted: Orders

## 2017-04-14 LAB — HM DIABETES EYE EXAM

## 2017-04-28 ENCOUNTER — Ambulatory Visit: Payer: Self-pay | Admitting: Internal Medicine

## 2017-04-28 ENCOUNTER — Ambulatory Visit: Payer: Self-pay | Admitting: Family Medicine

## 2017-05-04 ENCOUNTER — Other Ambulatory Visit: Payer: Self-pay

## 2017-05-04 ENCOUNTER — Encounter: Payer: Self-pay | Admitting: *Deleted

## 2017-05-04 DIAGNOSIS — Z794 Long term (current) use of insulin: Principal | ICD-10-CM

## 2017-05-04 DIAGNOSIS — IMO0002 Reserved for concepts with insufficient information to code with codable children: Secondary | ICD-10-CM

## 2017-05-04 DIAGNOSIS — E1165 Type 2 diabetes mellitus with hyperglycemia: Secondary | ICD-10-CM

## 2017-05-04 DIAGNOSIS — E118 Type 2 diabetes mellitus with unspecified complications: Principal | ICD-10-CM

## 2017-05-04 MED ORDER — INSULIN ASPART 100 UNIT/ML FLEXPEN: PEN_INJECTOR | SUBCUTANEOUS | 0 refills | Status: DC

## 2017-05-04 NOTE — Telephone Encounter (Signed)
Refilled insulin. Patient has not been seen for her uncontrolled diabetes since August 2018. White team please have patient schedule an appt ASAP so we can discuss diabetes. Thank you.   Anders Simmondshristina Gambino, MD [facility], PGY-3

## 2017-05-04 NOTE — Telephone Encounter (Signed)
LM on identifiable VM that Rx was refilled but she needs to make an appt.  Asked her to call back to schedule. Fleeger, Maryjo RochesterJessica Dawn, CMA   Also sent Mychart message. Fleeger, Maryjo RochesterJessica Dawn, CMA

## 2017-05-09 ENCOUNTER — Other Ambulatory Visit: Payer: Self-pay

## 2017-05-09 DIAGNOSIS — E1165 Type 2 diabetes mellitus with hyperglycemia: Secondary | ICD-10-CM

## 2017-05-09 DIAGNOSIS — IMO0002 Reserved for concepts with insufficient information to code with codable children: Secondary | ICD-10-CM

## 2017-05-09 DIAGNOSIS — Z794 Long term (current) use of insulin: Principal | ICD-10-CM

## 2017-05-09 DIAGNOSIS — E118 Type 2 diabetes mellitus with unspecified complications: Principal | ICD-10-CM

## 2017-05-09 MED ORDER — INSULIN ASPART 100 UNIT/ML FLEXPEN: PEN_INJECTOR | SUBCUTANEOUS | 0 refills | Status: DC

## 2017-05-16 ENCOUNTER — Encounter: Payer: Self-pay | Admitting: Family Medicine

## 2017-05-16 ENCOUNTER — Encounter: Payer: Self-pay | Admitting: Internal Medicine

## 2017-05-16 ENCOUNTER — Ambulatory Visit: Payer: Medicaid Other | Admitting: Internal Medicine

## 2017-05-16 VITALS — BP 125/80 | HR 98 | Temp 98.6°F | Wt 219.6 lb

## 2017-05-16 DIAGNOSIS — Z794 Long term (current) use of insulin: Secondary | ICD-10-CM

## 2017-05-16 DIAGNOSIS — E1165 Type 2 diabetes mellitus with hyperglycemia: Secondary | ICD-10-CM

## 2017-05-16 DIAGNOSIS — E1140 Type 2 diabetes mellitus with diabetic neuropathy, unspecified: Secondary | ICD-10-CM | POA: Diagnosis not present

## 2017-05-16 DIAGNOSIS — E118 Type 2 diabetes mellitus with unspecified complications: Secondary | ICD-10-CM

## 2017-05-16 DIAGNOSIS — IMO0002 Reserved for concepts with insufficient information to code with codable children: Secondary | ICD-10-CM

## 2017-05-16 LAB — POCT UA - MICROALBUMIN
Albumin/Creatinine Ratio, Urine, POC: >300
CREATININE, POC: 100 mg/dL
MICROALBUMIN (UR) POC: 150 mg/L

## 2017-05-16 LAB — POCT GLYCOSYLATED HEMOGLOBIN (HGB A1C): Hemoglobin A1C: 14.5

## 2017-05-16 MED ORDER — INSULIN ASPART 100 UNIT/ML FLEXPEN: PEN_INJECTOR | SUBCUTANEOUS | 0 refills | Status: DC

## 2017-05-16 MED ORDER — INSULIN PEN NEEDLE 31G X 8 MM MISC: 1.0000 | 11 refills | Status: DC | PRN

## 2017-05-16 MED ORDER — INSULIN GLARGINE 100 UNIT/ML SOLOSTAR PEN: 30.0000 [IU] | PEN_INJECTOR | Freq: Two times a day (BID) | SUBCUTANEOUS | 0 refills | Status: DC

## 2017-05-16 NOTE — Patient Instructions (Signed)
Karina [name],  Please start taking lantus 30 units twice daily and continue your mealtime insulin as 20 units with breakfast and lunch and 15 units at dinner.   Please keep a few fasting blood sugar measurements and return in 1-2 weeks to continue to adjust your medications.  Best, Dr. Sampson Goon

## 2017-05-19 ENCOUNTER — Encounter: Payer: Self-pay | Admitting: Internal Medicine

## 2017-05-19 NOTE — Assessment & Plan Note (Addendum)
-   A1c worsened at 14.5.  - Recommended resuming lantus twice daily. Can start at 30 U BID (doubling current dose) with expectation of needing to titrate up. Continue novolog 20, 20 and 15 with meals.  - Will obtain urine microalbumin - Will request ophthalmology records for retinopathy assessment - Recommended close follow-up in the next 1-2 weeks to review fasting blood sugar log on BID lantus. Plan to explain how to titrate up insulin at home. Could consider adding GLP-1 receptor agonist for improved control and benefit of weight loss if covered by insurance (byetta appears to be on Medicaid preferred drug list).  - Patient declined [facility] counseling for diabetes management today - Due for pneumococcal 23 vaccine. Address at follow-up.

## 2017-05-19 NOTE — Progress Notes (Signed)
[facility] Progress Note  Subjective:  Karina [name] is a 32 y.o. female with history of poorly controlled diabetes with neuropathy, HTN, and obesity who presents for follow-up of diabetes. Last A1c was 13.6 nine months ago. Last recommended regimen was 50 U lantus twice daily and 25 units novolog with meals. She has been taking 30 units of lantus at night and 20 units of novolog before breakfast and lunch and 15 in the evening because higher doses of short-acting at night seem to cause leg pain. She has been taking lantus once daily because she has a young daughter with a busy morning routine and forgets. The last time she recalls taking lantus 50 BID was last summer. She wonders if a once-weekly insulin would help with CBG control. She cannot take metformin due to GI upset and has tried reduced doses and XR. She plans to try setting an alarm for morning lantus. She notes a CBG of 495 last week. She had felt bad at the time, but sugars have otherwise been mostly in the 300s. 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 Dione Booze that she reports was normal. She requests 8mm pen needles. She just started a family exercise challenge and plans to workout 3-4 times a week. She also just cut out soda and has replaced it with no-calorie sparkling water. ROS: No increased urination, no foot injuries  No Known Allergies  Social History   Tobacco Use  . Smoking status: Never Smoker  . Smokeless tobacco: Never Used  Substance Use Topics  . Alcohol use: No    Objective: Blood pressure 125/80, pulse 98, temperature 98.6 F (37 C), temperature source Oral, weight 219 lb 9.6 oz (99.6 kg), last menstrual period 04/24/2017, SpO2 98 %. Body mass index is 40.17 kg/m. Constitutional: Pleasant, obese female in NAD HENT: MMM Musculoskeletal: No LE edema Neurological: Sensation to monofilament testing intact.   Skin: Foot exam without ulcers  Psychiatric: Normal mood and affect.  Vitals reviewed  Assessment/Plan: Type II diabetes mellitus with complication, uncontrolled ([facility]) - A1c worsened at 14.5.  - Recommended resuming lantus twice daily. Can start at 30 U BID (doubling current dose) with expectation of needing to titrate up. Continue novolog 20, 20 and 15 with meals.  - Will obtain urine microalbumin - Will request ophthalmology records for retinopathy assessment - Recommended close follow-up in the next 1-2 weeks to review fasting blood sugar log on BID lantus. Plan to explain how to titrate up insulin at home. Could consider adding GLP-1 receptor agonist for improved control and benefit of weight loss if covered by insurance (byetta appears to be on Medicaid preferred drug list).  - Patient declined [facility] counseling for diabetes management today - Due for pneumococcal 23 vaccine. Address at follow-up.  Follow-up 1-2 weeks to review blood sugar log.  Dani Gobble, MD [facility], PGY-3

## 2017-05-23 ENCOUNTER — Other Ambulatory Visit: Payer: Self-pay

## 2017-05-23 ENCOUNTER — Encounter: Payer: Self-pay | Admitting: Internal Medicine

## 2017-05-23 ENCOUNTER — Ambulatory Visit: Payer: Medicaid Other | Admitting: Internal Medicine

## 2017-05-23 VITALS — BP 124/78 | HR 93 | Temp 98.6°F | Wt 221.4 lb

## 2017-05-23 DIAGNOSIS — E1165 Type 2 diabetes mellitus with hyperglycemia: Secondary | ICD-10-CM

## 2017-05-23 DIAGNOSIS — E118 Type 2 diabetes mellitus with unspecified complications: Secondary | ICD-10-CM | POA: Diagnosis not present

## 2017-05-23 DIAGNOSIS — IMO0002 Reserved for concepts with insufficient information to code with codable children: Secondary | ICD-10-CM

## 2017-05-23 MED ORDER — INSULIN GLARGINE 100 UNIT/ML SOLOSTAR PEN: 35.0000 [IU] | PEN_INJECTOR | Freq: Two times a day (BID) | SUBCUTANEOUS | 0 refills | Status: DC

## 2017-05-23 MED ORDER — LISINOPRIL 5 MG PO TABS: 5.0000 mg | ORAL_TABLET | Freq: Every day | ORAL | 0 refills | Status: DC

## 2017-05-23 NOTE — Patient Instructions (Signed)
Karina [name],  Please start lisinopril 5 mg daily to protect your kidneys. You will need labwork at your visit next to make sure your potassium and kidney function stays normal.  Please increase lantus to 35 units twice daily. You can increase lantus by 1 unit in the morning and 1 unit in the evening for fasting blood sugar readings above 150/200. Continue mealtime insulin.  Best, Dr. Sampson Goon  Pneumococcal Polysaccharide Vaccine: What You Need to Know 1. Why get vaccinated? Vaccination can protect older adults (and some children and younger adults) from pneumococcal disease. Pneumococcal disease is caused by bacteria that can spread from person to person through close contact. It can cause ear infections, and it can also lead to more serious infections of the:  Lungs (pneumonia),  Blood (bacteremia), and  Covering of the brain and spinal cord (meningitis). Meningitis can cause deafness and brain damage, and it can be fatal.  Anyone can get pneumococcal disease, but children under 36 years of age, people with certain medical conditions, adults over 55 years of age, and cigarette smokers are at the highest risk. About 18,000 older adults die each year from pneumococcal disease in the [place]. Treatment of pneumococcal infections with penicillin and other drugs used to be more effective. But some strains of the disease have become resistant to these drugs. This makes prevention of the disease, through vaccination, even more important. 2. Pneumococcal polysaccharide vaccine (PPSV23) Pneumococcal polysaccharide vaccine (PPSV23) protects against 23 types of pneumococcal bacteria. It will not prevent all pneumococcal disease. PPSV23 is recommended for:  All adults 3 years of age and older,  Anyone 2 through 34 years of age with certain long-term health problems,  Anyone 2 through 34 years of age with a weakened immune system,  Adults 39 through 34 years of age who smoke cigarettes or  have asthma.  Most people need only one dose of PPSV. A second dose is recommended for certain high-risk groups. People 17 and older should get a dose even if they have gotten one or more doses of the vaccine before they turned 65. Your healthcare provider can give you more information about these recommendations. Most healthy adults develop protection within 2 to 3 weeks of getting the shot. 3. Some people should not get this vaccine  Anyone who has had a life-threatening allergic reaction to PPSV should not get another dose.  Anyone who has a severe allergy to any component of PPSV should not receive it. Tell your provider if you have any severe allergies.  Anyone who is moderately or severely ill when the shot is scheduled may be asked to wait until they recover before getting the vaccine. Someone with a mild illness can usually be vaccinated.  Children less than 50 years of age should not receive this vaccine.  There is no evidence that PPSV is harmful to either a pregnant woman or to her fetus. However, as a precaution, women who need the vaccine should be vaccinated before becoming pregnant, if possible. 4. Risks of a vaccine reaction With any medicine, including vaccines, there is a chance of side effects. These are usually mild and go away on their own, but serious reactions are also possible. About half of people who get PPSV have mild side effects, such as redness or pain where the shot is given, which go away within about two days. Less than 1 out of 100 people develop a fever, muscle aches, or more severe local reactions. Problems that could happen after any  vaccine:  People sometimes faint after a medical procedure, including vaccination. Sitting or lying down for about 15 minutes can help prevent fainting, and injuries caused by a fall. Tell your doctor if you feel dizzy, or have vision changes or ringing in the ears.  Some people get severe pain in the shoulder and have  difficulty moving the arm where a shot was given. This happens very rarely.  Any medication can cause a severe allergic reaction. Such reactions from a vaccine are very rare, estimated at about 1 in a million doses, and would happen within a few minutes to a few hours after the vaccination. As with any medicine, there is a very remote chance of a vaccine causing a serious injury or death. The safety of vaccines is always being monitored. For more information, visit: http://floyd.org/ 5. What if there is a serious reaction? What should I look for? Look for anything that concerns you, such as signs of a severe allergic reaction, very high fever, or unusual behavior. Signs of a severe allergic reaction can include hives, swelling of the face and throat, difficulty breathing, a fast heartbeat, dizziness, and weakness. These would usually start a few minutes to a few hours after the vaccination. What should I do? If you think it is a severe allergic reaction or other emergency that can't wait, call 9-1-1 or get to the nearest hospital. Otherwise, call your doctor. Afterward, the reaction should be reported to the Vaccine Adverse Event Reporting System (VAERS). Your doctor might file this report, or you can do it yourself through the VAERS web site at www.vaers.LAgents.no, or by calling 1-(306)303-1092. VAERS does not give medical advice. 6. How can I learn more?  Ask your doctor. He or she can give you the vaccine package insert or suggest other sources of information.  Call your local or state health department.  Contact the Centers for Disease Control and Prevention (CDC): ? Call 937-799-6311 (1-800-CDC-INFO) or ? Visit CDC's website at PicCapture.uy CDC Pneumococcal Polysaccharide Vaccine VIS (05/04/13) This information is not intended to replace advice given to you by your health care provider. Make sure you discuss any questions you have with your health care provider. Document  Released: 10/25/2005 Document Revised: 09/18/2015 Document Reviewed: 09/18/2015 Elsevier Interactive Patient Education  2017 [employer].

## 2017-05-23 NOTE — Progress Notes (Signed)
[facility] Progress Note  Subjective:  Karina [name] is a 32 y.o. female with history of poorly controlled T2DM, obesity, and HTN (no longer on medication) who presents for follow-up of diabetes. Seen last week and had A1c of 14.5. Patient had been taking lantus only once daily, 30 U, rather than BID. She also takes novolog (20, 20, 15 units with breakfast, lunch, and dinner). Cannot tolerate metformin due to GI upset. After last visit, she has increased lantus to 30 units twice daily but has not done so every day due to concerns about taking lantus in the morning if she is not sure when she will be eating. She has recorded a couple of fasting blood sugars; these were 508 on 5/9 and 272 on 5/10. She also reports CBGs of 384 in pm of 5/9, in the 300s pm of 5/11, and 350 the afternoon of the 12th. She has been exercising more as part of family challenge. She had an albumin to creatinine ratio > 300 at last visit and proteinuria in 2015, as well. She previously was on lisinopril and tolerated this well. She thinks she had the pneumococcal 23 vaccine within last few years. Complications of her diabetes include peripheral neuropathy and diabetic retinopathy, last noted 04/14/17 by Dr. Dione Booze with plans for follow-up in 3 months. ROS: No abdominal pain, no n/v/d  No Known Allergies  Social History   Tobacco Use  . Smoking status: Never Smoker  . Smokeless tobacco: Never Used  Substance Use Topics  . Alcohol use: No    Objective: Blood pressure 124/78, pulse 93, temperature 98.6 F (37 C), temperature source Oral, weight 221 lb 6.4 oz (100.4 kg), last menstrual period 04/24/2017, SpO2 99 %. Body mass index is 40.49 kg/m. Constitutional: Obese, pleasant female in NAD Musculoskeletal: No LE edema Neurological: AOx3, no focal deficits. Skin: Skin is warm and dry. No rash noted.  Psychiatric: Normal mood and affect.  Vitals reviewed  Assessment/Plan: Type II diabetes mellitus with  complication, uncontrolled (HCC) - Uncontrolled. Encouraged patient to take lantus consistently even if she is not going to eat soon thereafter; explained that this is her long-acting medication. Patient motivated to improve her diabetic control and agrees to taking lantus twice daily regularly.  - Increase lantus to 35 units BID. Counseled to continue to increase by 1 unit BID for fasting CBGs > 150. Will check in before getting to 50 U BID. - Do not think GLP-1 receptor agonist will have much impact on A1c at this point so will focus on adjusting insulin at this time. Could consider addition of oral agent if reaching maximum of insulin.  - Consider referring to Dr. Raymondo Band given young age and already having complications. Was not interested in diabetic or nutrition counseling at present. - Will restart lisinopril 5 mg with history of proteinuria. Return in 1 week for BMP. - Continue to encourage close follow-up until improved CBG range achieved.   Follow-up next week for BMP after initiating lisinopril and review of CBG log. Patient to look for record of pneumococcal 23 vaccine. Continue to suggest [facility] intervention--perhaps patient would be amenable to phone check-ups of diabetic control between office visits?  Dani Gobble, MD [facility], PGY-3

## 2017-05-24 ENCOUNTER — Encounter: Payer: Self-pay | Admitting: Internal Medicine

## 2017-05-24 NOTE — Assessment & Plan Note (Signed)
-   Uncontrolled. Encouraged patient to take lantus consistently even if she is not going to eat soon thereafter; explained that this is her long-acting medication. Patient motivated to improve her diabetic control and agrees to taking lantus twice daily regularly.  - Increase lantus to 35 units BID. Counseled to continue to increase by 1 unit BID for fasting CBGs > 150. Will check in before getting to 50 U BID. - Do not think GLP-1 receptor agonist will have much impact on A1c at this point so will focus on adjusting insulin at this time. Could consider addition of oral agent if reaching maximum of insulin.  - Consider referring to Dr. Raymondo Band given young age and already having complications. Was not interested in diabetic or nutrition counseling at present. - Will restart lisinopril 5 mg with history of proteinuria. Return in 1 week for BMP. - Continue to encourage close follow-up until improved CBG range achieved.

## 2017-05-31 ENCOUNTER — Ambulatory Visit: Payer: Medicaid Other | Admitting: Internal Medicine

## 2017-07-21 ENCOUNTER — Other Ambulatory Visit: Payer: Self-pay | Admitting: Internal Medicine

## 2017-08-29 ENCOUNTER — Other Ambulatory Visit: Payer: Self-pay

## 2017-08-29 ENCOUNTER — Ambulatory Visit: Payer: Medicaid Other | Admitting: Family Medicine

## 2017-08-29 ENCOUNTER — Encounter: Payer: Self-pay | Admitting: Family Medicine

## 2017-08-29 VITALS — BP 152/82 | HR 103 | Temp 98.5°F | Ht 62.0 in | Wt 228.0 lb

## 2017-08-29 DIAGNOSIS — E118 Type 2 diabetes mellitus with unspecified complications: Secondary | ICD-10-CM

## 2017-08-29 DIAGNOSIS — E1165 Type 2 diabetes mellitus with hyperglycemia: Secondary | ICD-10-CM

## 2017-08-29 DIAGNOSIS — R600 Localized edema: Secondary | ICD-10-CM

## 2017-08-29 DIAGNOSIS — I10 Essential (primary) hypertension: Secondary | ICD-10-CM | POA: Diagnosis not present

## 2017-08-29 DIAGNOSIS — IMO0002 Reserved for concepts with insufficient information to code with codable children: Secondary | ICD-10-CM

## 2017-08-29 HISTORY — DX: Localized edema: R60.0

## 2017-08-29 LAB — POCT GLYCOSYLATED HEMOGLOBIN (HGB A1C): HBA1C, POC (CONTROLLED DIABETIC RANGE): 10.6 % — AB (ref 0.0–7.0)

## 2017-08-29 LAB — POCT UA - MICROALBUMIN
Creatinine, POC: 50 mg/dL
Microalbumin Ur, POC: 150 mg/L

## 2017-08-29 MED ORDER — LISINOPRIL 5 MG PO TABS: 5.0000 mg | ORAL_TABLET | Freq: Every day | ORAL | 3 refills | Status: DC

## 2017-08-29 NOTE — Assessment & Plan Note (Signed)
BP elevated on both measurements today.  Will start lisinopril 5 mg today for microalbuminuria and hypertension.

## 2017-08-29 NOTE — Assessment & Plan Note (Signed)
A1c improved today at 10.6 from 14.5.  Congratulated patient on her progress.  To further improve her diabetes, advised patient to take Lantus 40 units BID if blood sugars >150 rather than >200.  Will keep Novolog dose the same.  She may also benefit from addition of SGLT2 or GLP1 in the future.  Discussed lifestyle modification in detail.  Patient appears motivated to continue making changes to her diet and exercise.  Remeasured urine microalbumin today.  Follow up in 3 months.

## 2017-08-29 NOTE — Assessment & Plan Note (Signed)
This is likely a result of venous stasis that could be exacerbated by protein spilling into her urine and therefore having less oncotic pressure in her vessels.  Discussed this with the patient.  Also discussed the renoprotective effects of lisinopril and the importance of starting this medication.  Advised elevating feet and avoiding sodium as much as possible.  Can always prescribe compression stockings in the future.

## 2017-08-29 NOTE — Progress Notes (Signed)
Subjective:    Karina MoloneyMarquise P [name] - 32 y.o. female MRN [id number]  Date of birth: [birth year]  HPI  Karina [name] is here for diabetes follow up.  She would also like to discuss recent ankle swelling.  T2DM Diet: water, no starch during the day at work, no meat Medications: 35 units Lantus BID, 40 units in the am if BS>200, Novolog 15 units TID AC BS: high 100s to low 200s (fasting) Hypoglycemia[facility], does not occur often Goals: Patient wants to reduce snacking, also wants to take insulin at the right time.  She also wants to walk during her lunch breaks.   Reports that her urine microalbumin was high at her last visit with Dr. Sampson GoonFitzgerald, and she was supposed to get it rechecked, but she did not.  She also remembers being prescribed lisinopril but did not pick this medication up.  Ankle swelling Bilateral ankles Has noticed for the last month, started with new job, thinks it could be due to sitting down more than at her previous job.   Worse by the end of the day No alleviating or exacerbating factors  Health Maintenance:  Health Maintenance Due  Topic Date Due  . PNEUMOCOCCAL POLYSACCHARIDE VACCINE AGE 53-64 HIGH RISK  07/31/1985  . INFLUENZA VACCINE  08/11/2017    -  reports that she has never smoked. She has never used smokeless tobacco. - Review of Systems: Per HPI. - Past Medical History: Patient Active Problem List   Diagnosis Date Noted  . Lower extremity edema 08/29/2017  . Encounter for vitamin deficiency screening 08/16/2016  . Boil 06/06/2015  . Syncope 04/08/2015  . Irregular menstrual cycle 07/04/2014  . Essential hypertension, benign 12/12/2013  . Annual physical exam 06/27/2013  . Morbid obesity (HCC) 05/25/2013  . Moderate nonproliferative diabetic retinopathy (HCC) 05/24/2013  . Diabetic neuropathy, type II diabetes mellitus (HCC) 05/23/2013  . Toenail fungus 01/17/2013  . Type II diabetes mellitus with complication, uncontrolled  (HCC) 12/27/2012  . Depression 12/27/2012   - Medications: reviewed and updated   Objective:   Physical Exam BP (!) 152/82   Pulse (!) 103   Temp 98.5 F (36.9 C) (Oral)   Ht 5\' 2"  (1.575 m)   Wt 228 lb (103.4 kg)   LMP 08/19/2017 (Exact Date)   SpO2 98%   BMI 41.70 kg/m  Gen: NAD, alert, cooperative with exam, well-appearing CV: RRR, good S1/S2, no murmur, bilateral 1+ pitting edema up to mid shin bilaterally Resp: CTABL, no wheezes, non-labored Abd: SNTND, no guarding or organomegaly Skin: no rashes, normal turgor  Psych: good insight, alert and oriented      Assessment & Plan:   Type II diabetes mellitus with complication, uncontrolled (HCC) A1c improved today at 10.6 from 14.5.  Congratulated patient on her progress.  To further improve her diabetes, advised patient to take Lantus 40 units BID if blood sugars >150 rather than >200.  Will keep Novolog dose the same.  She may also benefit from addition of SGLT2 or GLP1 in the future.  Discussed lifestyle modification in detail.  Patient appears motivated to continue making changes to her diet and exercise.  Remeasured urine microalbumin today.  Follow up in 3 months.  Lower extremity edema This is likely a result of venous stasis that could be exacerbated by protein spilling into her urine and therefore having less oncotic pressure in her vessels.  Discussed this with the patient.  Also discussed the renoprotective effects of lisinopril  and the importance of starting this medication.  Advised elevating feet and avoiding sodium as much as possible.  Can always prescribe compression stockings in the future.  Essential hypertension, benign BP elevated on both measurements today.  Will start lisinopril 5 mg today for microalbuminuria and hypertension.    Lezlie OctaveAmanda Caroljean Monsivais, M.D. 08/29/2017, 3:57 PM PGY-2, [facility]

## 2017-08-29 NOTE — Patient Instructions (Addendum)
It was nice meeting you today Karina [name]!  Congratulations on improving your A1c!  It is 10.6 today, down from 14.5 in May.    Please take Lantus 40 units unless your blood sugar is <150.  Walk during your lunch break - this will help with your sugar, weight, and swelling.  Start taking Lisinopril 5 mg daily to protect your kidneys and to improve your blood pressure.  Elevate your feet to reduce swelling and avoid salt as much as you can.  If you have any questions or concerns, please feel free to call the clinic.   Be well,  Dr. Frances FurbishWinfrey

## 2017-09-23 ENCOUNTER — Other Ambulatory Visit: Payer: Self-pay | Admitting: Family Medicine

## 2017-09-23 DIAGNOSIS — IMO0002 Reserved for concepts with insufficient information to code with codable children: Secondary | ICD-10-CM

## 2017-09-23 DIAGNOSIS — Z794 Long term (current) use of insulin: Principal | ICD-10-CM

## 2017-09-23 DIAGNOSIS — E118 Type 2 diabetes mellitus with unspecified complications: Principal | ICD-10-CM

## 2017-09-23 DIAGNOSIS — E1165 Type 2 diabetes mellitus with hyperglycemia: Secondary | ICD-10-CM

## 2017-11-22 ENCOUNTER — Ambulatory Visit (HOSPITAL_COMMUNITY)
Admission: EM | Admit: 2017-11-22 | Discharge: 2017-11-22 | Discharge status: Against Medical Advice | Payer: Medicaid Other

## 2017-11-29 ENCOUNTER — Other Ambulatory Visit: Payer: Self-pay

## 2017-11-29 MED ORDER — INSULIN GLARGINE 100 UNIT/ML SOLOSTAR PEN: PEN_INJECTOR | SUBCUTANEOUS | 0 refills | Status: DC

## 2017-11-29 NOTE — Telephone Encounter (Signed)
[name] needs an appointment with me or another provider as soon as possible to check her A1c.  Last A1c check was three months ago and she has uncontrolled diabetes.  Please call to make an appointment for her.  Thank you!

## 2017-12-01 NOTE — Progress Notes (Signed)
Subjective:    Patient ID: Karina [name], female    DOB: [birth year], 32 y.o.   MRN: [id number]   CC:  HPI: Diabetes Fasting checks: Does not check daily.  States that when she does use her Lantus as prescribed they are usually 110, 105, 98. Post prandial yesterday was 356 but remembers drinking something sweet at work.  Usually in the high 200s Compliance: Poor.  Patient does not use Lantus as prescribed.  Has been prescribed 35 to 40 units twice daily, instead patient is using 50 units daily at night and this is still not on a daily basis.  Also is not using NovoLog as prescribed.  Is prescribed NovoLog 20 units for breakfast and lunch and 15 units at dinner, patient is instead using 15 units before bed and 20 units with lunch.  No breakfast NovoLog given as patient does not eat breakfast consistently. Diet: Poor.  Patient states she eats mostly vegetables and fish but lots of carbs and candy at work.  Has stopped eating as much candy and stopped eating chips at work.  Patient works third shift and gets off around 1 AM.  Has to take her daughter to school at 7 AM.  Meals are inconsistent. Exercise: [facility] in [facility] while.  Is going to try and get back to walking at least a mile a day. Eye exam: Patient is due to go back for follow-up in August but has been unable to schedule appointment Foot exam: Up-to-date, 05/2017 A1C: 11.7, worsening from 10.6 3 months ago Symptoms: No symptoms of hypoglycemia.  No symptoms of  polyuria, polydipsia.  Reports increased numbness in feet bilaterally.  States it is becoming very uncomfortable for her to get pedicures due to neuropathy. Denies foot ulcers/trauma Meds: Lantus 40 units BID if blood sugars >150, novolog 20U with breakfast and lunch 15U at dinner. Patient instead taking lantus 50U daily at night (when she remembers) and novolog 15U before bed 20U with lunch.    Objective:  BP 130/80   Pulse 91   Temp 97.9 F (36.6 C)  (Oral)   Ht 5\' 2"  (1.575 m)   Wt 224 lb (101.6 kg)   SpO2 97%   BMI 40.97 kg/m  Vitals and nursing note reviewed  General: well nourished, in no acute distress HEENT: normocephalic, moist mucous membranes  Cardiac: RRR, clear S1 and S2, no murmurs, rubs, or gallops Respiratory: clear to auscultation bilaterally, no increased work of breathing Abdomen: soft, nontender, nondistended, no masses or organomegaly. Bowel sounds present Extremities: no edema or cyanosis. Warm, well perfused. 2+ radial pulses bilaterally. Normal grip strength  Skin: warm and dry, no rashes noted Neuro: alert and oriented, no focal deficits   Assessment & Plan:    Type II diabetes mellitus with complication, uncontrolled (HCC) Poorly controlled.  A1c worsening today to 11.7 from 10.6.  Patient has been noncompliant on medications and having poor diet.  No exercise as well.  Discussed in length the importance of proper diabetes control and risks if diabetes is not well controlled.  Stressed importance of daily monitoring of blood sugars especially since patient is on insulin to properly manage sugars and to ensure no hypoglycemic episodes.  We will plan to continue medications as prescribed as it is unclear whether this dose of insulin allows patient to be well controlled.  Patient states that when she did take medications as prescribed her sugars were better controlled.  Will keep Lantus at  40 units twice daily if blood sugars over 150.  We will keep NovoLog at 20 units with breakfast and lunch and 15 units with dinner.  The patient will likely need to either increase insulin or benefit from a GLP-1 in the future.  Discussed this with patient.  Advised patient to follow-up in 1 month with Dr. Griffith Citron management.  Stressed lifestyle modifications as well.  AVS with diabetic diet information given.  Patient stated that she would like referral to endocrinology for diabetes management.  Ambulatory referral to  endocrinology placed.  Strict return precautions given.  Advised patient to check both fasting and mealtime sugars daily and call with any blood sugar readings that are less than 90.  Diabetic neuropathy, type II diabetes mellitus Patient formerly on gabapentin however has not been taking in some time.  Given worsening of neuropathy will start gabapentin 100 mg nightly.  If not improved can increase dose.    Return in about 1 month (around 01/01/2018) for Dr. Raymondo Band for diabetes.   [name], DO, PGY-2

## 2017-12-01 NOTE — Telephone Encounter (Signed)
Contacted pt and informed her of below and scheduled an appointment in ATC due to her mentioning that she has had some chest pain. Lamonte SakaiZimmerman Rumple, April D, [place]

## 2017-12-02 ENCOUNTER — Other Ambulatory Visit: Payer: Self-pay

## 2017-12-02 ENCOUNTER — Ambulatory Visit: Payer: Medicaid Other | Admitting: Family Medicine

## 2017-12-02 VITALS — BP 130/80 | HR 91 | Temp 97.9°F | Ht 62.0 in | Wt 224.0 lb

## 2017-12-02 DIAGNOSIS — E1165 Type 2 diabetes mellitus with hyperglycemia: Secondary | ICD-10-CM | POA: Diagnosis not present

## 2017-12-02 DIAGNOSIS — E118 Type 2 diabetes mellitus with unspecified complications: Secondary | ICD-10-CM | POA: Diagnosis not present

## 2017-12-02 DIAGNOSIS — E1140 Type 2 diabetes mellitus with diabetic neuropathy, unspecified: Secondary | ICD-10-CM | POA: Diagnosis not present

## 2017-12-02 DIAGNOSIS — Z794 Long term (current) use of insulin: Secondary | ICD-10-CM | POA: Diagnosis not present

## 2017-12-02 DIAGNOSIS — IMO0002 Reserved for concepts with insufficient information to code with codable children: Secondary | ICD-10-CM

## 2017-12-02 LAB — POCT GLYCOSYLATED HEMOGLOBIN (HGB A1C): HBA1C, POC (CONTROLLED DIABETIC RANGE): 11.7 % — AB (ref 0.0–7.0)

## 2017-12-02 MED ORDER — INSULIN GLARGINE 100 UNIT/ML SOLOSTAR PEN: 35.0000 [IU] | PEN_INJECTOR | Freq: Two times a day (BID) | SUBCUTANEOUS | 0 refills | Status: DC

## 2017-12-02 MED ORDER — INSULIN ASPART 100 UNIT/ML FLEXPEN: PEN_INJECTOR | SUBCUTANEOUS | 0 refills | Status: DC

## 2017-12-02 MED ORDER — GABAPENTIN 100 MG PO CAPS: 100.0000 mg | ORAL_CAPSULE | Freq: Every day | ORAL | 3 refills | Status: DC

## 2017-12-02 NOTE — Assessment & Plan Note (Signed)
Poorly controlled.  A1c worsening today to 11.7 from 10.6.  Patient has been noncompliant on medications and having poor diet.  No exercise as well.  Discussed in length the importance of proper diabetes control and risks if diabetes is not well controlled.  Stressed importance of daily monitoring of blood sugars especially since patient is on insulin to properly manage sugars and to ensure no hypoglycemic episodes.  We will plan to continue medications as prescribed as it is unclear whether this dose of insulin allows patient to be well controlled.  Patient states that when she did take medications as prescribed her sugars were better controlled.  Will keep Lantus at 40 units twice daily if blood sugars over 150.  We will keep NovoLog at 20 units with breakfast and lunch and 15 units with dinner.  The patient will likely need to either increase insulin or benefit from a GLP-1 in the future.  Discussed this with patient.  Advised patient to follow-up in 1 month with Dr. Griffith CitronKoval Beatties management.  Stressed lifestyle modifications as well.  AVS with diabetic diet information given.  Patient stated that she would like referral to endocrinology for diabetes management.  Ambulatory referral to endocrinology placed.  Strict return precautions given.  Advised patient to check both fasting and mealtime sugars daily and call with any blood sugar readings that are less than 90.

## 2017-12-02 NOTE — Assessment & Plan Note (Signed)
Patient formerly on gabapentin however has not been taking in some time.  Given worsening of neuropathy will start gabapentin 100 mg nightly.  If not improved can increase dose.

## 2017-12-02 NOTE — Patient Instructions (Signed)
  Diet Recommendations for Diabetes   Starchy (carb) foods: Bread, rice, pasta, potatoes, corn, cereal, grits, crackers, bagels, muffins, all baked goods.  (Fruits, milk, and yogurt also have carbohydrate, but most of these foods will not spike your blood sugar as the starchy foods will.)  A few fruits do cause high blood sugars; use small portions of bananas (limit to 1/2 at a time), grapes, watermelon, oranges, and most tropical fruits.    Protein foods: Meat, fish, poultry, eggs, dairy foods, and beans such as pinto and kidney beans (beans also provide carbohydrate).   1. Eat at least 3 meals and 1-2 snacks per day. Never go more than 4-5 hours while awake without eating. Eat breakfast within the first hour of getting up.   2. Limit starchy foods to TWO per meal and ONE per snack. ONE portion of a starchy  food is equal to the following:   - ONE slice of bread (or its equivalent, such as half of a hamburger bun).   - 1/2 cup of a "scoopable" starchy food such as potatoes or rice.   - 15 grams of carbohydrate as shown on food label.  3. Include at every meal: a protein food, a carb food, and vegetables and/or fruit.   - Obtain twice the volume of veg's as protein or carbohydrate foods for both lunch and dinner.   - Fresh or frozen veg's are best.   - Keep frozen veg's on hand for a quick vegetable serving.      It was a pleasure seeing you today.   Today we discussed your diabetes  For your diabetes: please take novolog 20 Units with breakfast and lunch, 15 Units with dinner. For lantus take 35-40 twice a day (Lantus 40 units if blood sugars >150). I have referred you to endocrinology. Please follow up in 1 month with Dr. Raymondo BandKoval for diabetes. Check your sugars fasting and with meals. Please call if sugars are less than 90! If sugars are very low go to the emergency room.   Use gabapentin at night daily. If no improvement we can increase the dose  Please follow up in 1 month or sooner if  symptoms persist or worsen. Please call the clinic immediately if you have any concerns.   Our clinic's number is [phone]. Please call with questions or concerns.   Please go to the emergency room if sugars are low  Thank you,  Oralia ManisSherin Cassidi Modesitt, DO

## 2017-12-04 ENCOUNTER — Emergency Department (HOSPITAL_COMMUNITY)
Admission: EM | Admit: 2017-12-04 | Discharge: 2017-12-04 | Disposition: A | Discharge status: Home / Self Care | Payer: Medicaid Other | Attending: Emergency Medicine | Admitting: Emergency Medicine

## 2017-12-04 ENCOUNTER — Other Ambulatory Visit: Payer: Self-pay

## 2017-12-04 ENCOUNTER — Emergency Department (HOSPITAL_COMMUNITY): Payer: Medicaid Other

## 2017-12-04 ENCOUNTER — Encounter (HOSPITAL_COMMUNITY): Payer: Self-pay

## 2017-12-04 DIAGNOSIS — E119 Type 2 diabetes mellitus without complications: Secondary | ICD-10-CM | POA: Insufficient documentation

## 2017-12-04 DIAGNOSIS — M79602 Pain in left arm: Secondary | ICD-10-CM | POA: Diagnosis not present

## 2017-12-04 DIAGNOSIS — Z794 Long term (current) use of insulin: Secondary | ICD-10-CM | POA: Insufficient documentation

## 2017-12-04 DIAGNOSIS — M25512 Pain in left shoulder: Secondary | ICD-10-CM | POA: Diagnosis not present

## 2017-12-04 DIAGNOSIS — M546 Pain in thoracic spine: Secondary | ICD-10-CM | POA: Insufficient documentation

## 2017-12-04 DIAGNOSIS — Z79899 Other long term (current) drug therapy: Secondary | ICD-10-CM | POA: Insufficient documentation

## 2017-12-04 DIAGNOSIS — S59902A Unspecified injury of left elbow, initial encounter: Secondary | ICD-10-CM | POA: Diagnosis not present

## 2017-12-04 DIAGNOSIS — S4992XA Unspecified injury of left shoulder and upper arm, initial encounter: Secondary | ICD-10-CM | POA: Diagnosis not present

## 2017-12-04 DIAGNOSIS — W19XXXA Unspecified fall, initial encounter: Secondary | ICD-10-CM

## 2017-12-04 DIAGNOSIS — I10 Essential (primary) hypertension: Secondary | ICD-10-CM | POA: Diagnosis not present

## 2017-12-04 DIAGNOSIS — M25522 Pain in left elbow: Secondary | ICD-10-CM | POA: Diagnosis not present

## 2017-12-04 MED ORDER — IBUPROFEN 400 MG PO TABS
400.0000 mg | ORAL_TABLET | Freq: Once | ORAL | Status: AC
  Administered 2017-12-04: 400 mg via ORAL
  Filled 2017-12-04: qty 1

## 2017-12-04 MED ORDER — ACETAMINOPHEN 500 MG PO TABS
500.0000 mg | ORAL_TABLET | Freq: Once | ORAL | Status: AC
  Administered 2017-12-04: 500 mg via ORAL
  Filled 2017-12-04: qty 1

## 2017-12-04 NOTE — ED Notes (Signed)
PT states understanding of care given, follow up care, and medication prescribed. PT ambulated from ED to car with a steady gait. 

## 2017-12-04 NOTE — Discharge Instructions (Addendum)
You may use over-the-counter Motrin (Ibuprofen), Acetaminophen (Tylenol), topical muscle creams such as SalonPas, Federal-Mogulcy Hot, Bengay, etc. Please stretch, apply ice for the next 2 to 3 days then you may apply heat, and have massage therapy for additional assistance.

## 2017-12-04 NOTE — ED Notes (Signed)
Pt was given water to hydrate.

## 2017-12-04 NOTE — ED Triage Notes (Signed)
Pt coming from home states that pt was walking and had walked off a carpet when she states she slipped and landed on her left side, and back. Pt c/o left sided pain, neck pain, and upper back pain. Pt is axox4

## 2017-12-04 NOTE — ED Provider Notes (Signed)
[facility] EMERGENCY DEPARTMENT Provider Note  CSN: [id number] Arrival date & time: 12/04/17 0032  Chief Complaint(s) Fall  HPI Karina [name] is a 32 y.o. female here after mechanical fall.  HPI Patient reports slipping on wet floor causing her to fall backwards onto her left side.  She complained immediately of left shoulder and elbow pain.  She also reported hitting her head but denied any loss of consciousness.  She was able to stand up and ambulate without complication.  About an hour or 2 after the fall her pain gradually got worse and started spreading to her upper back on the left side and then across to the right side.  Patient also developed left mid and lower back pain.  Describes the pain as an achy pain moderate to severe in intensity.  Exacerbated with certain movements and palpation to the area.  Alleviated by mobility.  She denies any difficulty ambulating.  No lower extremity weakness or loss of sensation.  No bladder/bowel incontinence.  No headache or focal deficits.  Past Medical History Past Medical History:  Diagnosis Date  . Diabetes mellitus    Type 2, insulin resistant   Patient Active Problem List   Diagnosis Date Noted  . Lower extremity edema 08/29/2017  . Encounter for vitamin deficiency screening 08/16/2016  . Boil 06/06/2015  . Syncope 04/08/2015  . Irregular menstrual cycle 07/04/2014  . Essential hypertension, benign 12/12/2013  . Annual physical exam 06/27/2013  . Morbid obesity ([place]) 05/25/2013  . Moderate nonproliferative diabetic retinopathy ([place]) 05/24/2013  . Diabetic neuropathy, type II diabetes mellitus ([place]) 05/23/2013  . Toenail fungus 01/17/2013  . Type II diabetes mellitus with complication, uncontrolled ([place]) 12/27/2012  . Depression 12/27/2012   Home Medication(s) Prior to Admission medications   Medication Sig Start Date End Date Taking? Authorizing Provider  ACCU-CHEK FASTCLIX LANCETS MISC Check sugar 5 x  daily 12/21/16   Carlyle Dolly, MD  Cholecalciferol 50000 units capsule Take 1 capsule (50,000 Units total) by mouth every 7 (seven) days. 09/03/16   Carlyle Dolly, MD  gabapentin (NEURONTIN) 100 MG capsule Take 1 capsule (100 mg total) by mouth at bedtime. 12/02/17   Caroline More, DO  glucose blood (ACCU-CHEK SMARTVIEW) test strip Check sugar 5 x daily 12/21/16   Carlyle Dolly, MD  insulin aspart (NOVOLOG FLEXPEN) 100 UNIT/ML FlexPen INJECT 20 units with breakfast and lunch and 15 units with dinner. 12/02/17   Caroline More, DO  Insulin Glargine (LANTUS SOLOSTAR) 100 UNIT/ML Solostar Pen Inject 35 Units into the skin 2 (two) times daily. 12/02/17   Caroline More, DO  Insulin Pen Needle (B-D ULTRAFINE III SHORT PEN) 31G X 8 MM MISC 1 Container by Does not apply route as needed. 05/16/17   Rogue Bussing, MD  Insulin Syringe-Needle U-100 Flossie Buffy INSULIN SYRINGE) 31G X 5/16" 1 ML MISC Use to inject insulin. 08/16/16   Carlyle Dolly, MD  Lancets Misc. (ACCU-CHEK SOFTCLIX LANCET DEV) KIT 1 application by Does not apply route 2 (two) times daily. 12/15/16   Carlyle Dolly, MD  lisinopril (PRINIVIL,ZESTRIL) 5 MG tablet Take 1 tablet (5 mg total) by mouth daily. 08/29/17   Kathrene Alu, MD  meclizine (ANTIVERT) 12.5 MG tablet Take 1 tablet (12.5 mg total) by mouth 3 (three) times daily as needed for dizziness. Patient not taking: Reported on 05/24/2016 02/06/16   Barnet Glasgow, NP  naproxen (NAPROSYN) 500 MG tablet Take 1 tablet (500 mg total) by mouth 2 (  two) times daily as needed. Patient not taking: Reported on 05/24/2016 02/10/16   Alveda Reasons, MD                                                                                                                                    Past Surgical History Past Surgical History:  Procedure Laterality Date  . CERVICAL BIOPSY  2004  . CESAREAN SECTION N/A 06/02/2012   Procedure: CESAREAN SECTION;   Surgeon: Mora Bellman, MD;  Location: [facility];  Service: Obstetrics;  Laterality: N/A;   Family History Family History  Problem Relation Age of Onset  . Diabetes Father     Social History Social History   Tobacco Use  . Smoking status: Never Smoker  . Smokeless tobacco: Never Used  Substance Use Topics  . Alcohol use: No  . Drug use: No   Allergies Patient has no known allergies.  Review of Systems Review of Systems All other systems are reviewed and are negative for acute change except as noted in the HPI  Physical Exam Vital Signs  I have reviewed the triage vital signs BP (!) 168/106 (BP Location: Right Arm)   Pulse 98   Temp 99.1 F (37.3 C) (Oral)   Resp 18   Ht '5\' 2"'$  (1.575 m)   Wt 101.6 kg   LMP 11/19/2017   SpO2 99%   BMI 40.97 kg/m   Physical Exam  Constitutional: She is oriented to person, place, and time. She appears well-developed and well-nourished. No distress.  HENT:  Head: Normocephalic and atraumatic.  Right Ear: External ear normal.  Left Ear: External ear normal.  Nose: Nose normal.  Eyes: Pupils are equal, round, and reactive to light. Conjunctivae and EOM are normal. Right eye exhibits no discharge. Left eye exhibits no discharge. No scleral icterus.  Neck: Normal range of motion. Neck supple.  Cardiovascular: Normal rate, regular rhythm and normal heart sounds. Exam reveals no gallop and no friction rub.  No murmur heard. Pulses:      Radial pulses are 2+ on the right side, and 2+ on the left side.       Dorsalis pedis pulses are 2+ on the right side, and 2+ on the left side.  Pulmonary/Chest: Effort normal and breath sounds normal. No stridor. No respiratory distress. She has no wheezes. She exhibits tenderness.    Abdominal: Soft. She exhibits no distension. There is no tenderness.  Musculoskeletal: She exhibits no edema.       Left shoulder: She exhibits tenderness and bony tenderness. She exhibits no deformity, normal pulse and  normal strength.       Left elbow: She exhibits normal range of motion, no swelling and no deformity. Tenderness found.       Left hip: She exhibits tenderness. She exhibits normal range of motion, normal strength, no bony tenderness, no swelling and no deformity.       Cervical  back: She exhibits tenderness and spasm. She exhibits no bony tenderness.       Thoracic back: She exhibits no bony tenderness.       Lumbar back: She exhibits no bony tenderness.       Back:       Legs: Clavicles stable. Chest stable to AP/Lat compression. Pelvis stable to Lat compression. No obvious extremity deformity. No chest or abdominal wall contusion.  Neurological: She is alert and oriented to person, place, and time.  Spine Exam: Strength: 5/5 throughout LE bilaterally (hip flexion/extension, adduction/abduction; knee flexion/extension; foot dorsiflexion/plantarflexion, inversion/eversion; great toe inversion) Sensation: Intact to light touch in proximal and distal LE bilaterally Reflexes: 1+ quadriceps and achilles reflexes   Skin: Skin is warm and dry. No rash noted. She is not diaphoretic. No erythema.  Psychiatric: She has a normal mood and affect.    ED Results and Treatments Labs (all labs ordered are listed, but only abnormal results are displayed) Labs Reviewed - No data to display                                                                                                                       EKG  EKG Interpretation  Date/Time:    Ventricular Rate:    PR Interval:    QRS Duration:   QT Interval:    QTC Calculation:   R Axis:     Text Interpretation:        Radiology Dg Elbow Complete Left (3+view)  Result Date: 12/04/2017 CLINICAL DATA:  Fall, elbow pain. EXAM: LEFT ELBOW - COMPLETE 3+ VIEW COMPARISON:  None. FINDINGS: There is no evidence of fracture, dislocation, or joint effusion. There is no evidence of arthropathy or other focal bone abnormality. Soft tissues are  unremarkable. IMPRESSION: Negative. Electronically Signed   By: Franki Cabot M.D.   On: 12/04/2017 01:57   Dg Shoulder Left  Result Date: 12/04/2017 CLINICAL DATA:  Fall, shoulder and elbow pain. EXAM: LEFT SHOULDER - 2+ VIEW COMPARISON:  None. FINDINGS: Three views of the LEFT shoulder are provided. Osseous alignment is normal. No fracture line or displaced fracture fragment seen. Adjacent soft tissues are unremarkable. IMPRESSION: Negative. Electronically Signed   By: Franki Cabot M.D.   On: 12/04/2017 01:56   Pertinent labs & imaging results that were available during my care of the patient were reviewed by me and considered in my medical decision making (see chart for details).  Medications Ordered in ED Medications  acetaminophen (TYLENOL) tablet 500 mg (500 mg Oral Given 12/04/17 0151)  ibuprofen (ADVIL,MOTRIN) tablet 400 mg (400 mg Oral Given 12/04/17 0151)  Procedures Procedures  (including critical care time)  Medical Decision Making / ED Course I have reviewed the nursing notes for this encounter and the patient's prior records (if available in EHR or on provided paperwork).    Mechanical fall resulting in back and left arm pain.  Most concerning pain on exam was left elbow and shoulder.  Plain films of these areas were negative.  Other areas of tenderness appear to be soft tissue/muscular in nature.  No need for additional imaging at this time.  The patient appears reasonably screened and/or stabilized for discharge and I doubt any other medical condition or other [facility] requiring further screening, evaluation, or treatment in the ED at this time prior to discharge.  The patient is safe for discharge with strict return precautions.   Final Clinical Impression(s) / ED Diagnoses Final diagnoses:  Fall  Acute bilateral thoracic back pain  Acute  pain of left shoulder  Left arm pain   Disposition: Discharge  Condition: Good  I have discussed the results, Dx and Tx plan with the patient who expressed understanding and agree(s) with the plan. Discharge instructions discussed at great length. The patient was given strict return precautions who verbalized understanding of the instructions. No further questions at time of discharge.    ED Discharge Orders    None       Follow Up: Primary care provider  Schedule an appointment as soon as possible for a visit  As needed      This chart was dictated using voice recognition software.  Despite best efforts to proofread,  errors can occur which can change the documentation meaning.   Fatima Blank, MD 12/04/17 [place]

## 2017-12-05 ENCOUNTER — Encounter: Payer: Self-pay | Admitting: Surgery

## 2017-12-11 NOTE — Progress Notes (Signed)
  Subjective:   Patient ID: Karina [name]    DOB: January 25, 1985, 32 y.o. female   MRN: [id number]  Karina [name] is a 32 y.o. female with a history of HTN, T2DM, obesity, depression here for   Back Pain - s/p slip and fall on wet floor onto L side with resultant L shoulder and elbow pain spreading to bilateral upper back and L mid and lower back. L shoulder and elbow xrays negative in the ED. - presents today for persistent pain, located across top of her back and bilateral low back/hips. Exacerbated by sitting at work. - Denies fevers, pain sometimes wakes her up from sleep depending on what position she lies in. - L hand with some tingling and numbness since fall, none in legs. Sometimes L hand will get weak and is difficult to hold onto things for a long time. Typing hurts, big part of job. - no bowel/bladder incontinence - no pain with walking - has taken tylenol, ibuprofen 600mg , interchanges every 4 hours. Reports helps with pain and alleviates almost completely.  - has not tried heating pad  Review of Systems:  Per HPI.  PMFSH, medications and smoking status reviewed.  Objective:   BP (!) 134/92   Pulse 79   Temp 98.1 F (36.7 C) (Oral)   Wt 230 lb (104.3 kg)   LMP 11/19/2017   SpO2 99%   BMI 42.07 kg/m  Vitals and nursing note reviewed.  General: obese female, in no acute distress with non-toxic appearance CV: regular rate and rhythm without murmurs, rubs, or gallops Lungs: clear to auscultation bilaterally with normal work of breathing Skin: warm, dry, no rashes or lesions Extremities: warm and well perfused, normal tone MSK: painful ROM with bending at waist and raising arms above head. Pain with light palpation of upper and lower back. 5/5 strength to U/LE bilaterally. Grip strength intact. Gait normal. Neuro: Alert and oriented, speech normal  Assessment & Plan:   Back pain Acute s/p fall without radicular or sciatic symptoms. Likely MSK related, no fractures  on imaging in ED. Exam with tenderness to palpation and strength testing although with intact strength. Pain relieved with tylenol and ibuprofen, encouraged to continue and use heating pad PRN. Referral made for PT.  Orders Placed This Encounter  Procedures  . Ambulatory referral to Physical Therapy    Referral Priority:   Routine    Referral Type:   Physical Medicine    Referral Reason:   Specialty Services Required    Requested Specialty:   Physical Therapy    Number of Visits Requested:   1   No orders of the defined types were placed in this encounter.   Karina DenseAlison Mayetta Castleman, DO PGY-2, [facility] 12/12/2017 3:02 PM

## 2017-12-12 ENCOUNTER — Other Ambulatory Visit: Payer: Self-pay

## 2017-12-12 ENCOUNTER — Ambulatory Visit: Payer: Medicaid Other | Admitting: Family Medicine

## 2017-12-12 ENCOUNTER — Encounter: Payer: Self-pay | Admitting: Family Medicine

## 2017-12-12 VITALS — BP 134/92 | HR 79 | Temp 98.1°F | Wt 230.0 lb

## 2017-12-12 DIAGNOSIS — M549 Dorsalgia, unspecified: Secondary | ICD-10-CM

## 2017-12-12 DIAGNOSIS — M545 Low back pain, unspecified: Secondary | ICD-10-CM

## 2017-12-12 HISTORY — DX: Dorsalgia, unspecified: M54.9

## 2017-12-12 NOTE — Patient Instructions (Signed)
It was great to see you!  Our plans for today:  - Continue to use tylenol and ibuprofen for pain. You can also try a heating pad. - We are referring you to Physical Therapy. Someone will call you with this appointment.  Take care and seek immediate care sooner if you develop any concerns.   Dr. Mollie Germanyumball [facility]

## 2017-12-12 NOTE — Assessment & Plan Note (Signed)
Acute s/p fall without radicular or sciatic symptoms. Likely MSK related, no fractures on imaging in ED. Exam with tenderness to palpation and strength testing although with intact strength. Pain relieved with tylenol and ibuprofen, encouraged to continue and use heating pad PRN. Referral made for PT.

## 2017-12-15 DIAGNOSIS — F6381 Intermittent explosive disorder: Secondary | ICD-10-CM | POA: Diagnosis not present

## 2017-12-16 ENCOUNTER — Encounter: Payer: Self-pay | Admitting: Family Medicine

## 2017-12-19 ENCOUNTER — Telehealth: Payer: Self-pay | Admitting: Family Medicine

## 2017-12-19 NOTE — Telephone Encounter (Signed)
Received Epic message that patient was having active chest pain and shortness of breath. PMHx significant for HTN, T2DM, and morbid obesity. Family Hx significant for DM. Given that patient is having active chest pain and SOB I called patient and informed her to go to ED if she is having active chest pain and SOB. Also advised that she follow up with her PCP (Dr. Frances FurbishWinfrey) after ED visit for hospital follow up.   Orpah ClintonSherin Chelcy Bolda, DO, PGY-2 [facility] 12/19/2017 6:00 PM

## 2017-12-19 NOTE — Telephone Encounter (Signed)
Was not forward this message until 12/19/17 @ 5:50PM. At that time I called patient to discuss message. Patient verbalized she will go to ED and schedule follow up with Dr. Frances FurbishWinfrey.   Orpah ClintonSherin Shiloh Southern, DO, PGY-2 [facility] 12/19/2017 6:05 PM

## 2017-12-20 ENCOUNTER — Ambulatory Visit: Payer: Medicaid Other

## 2017-12-26 ENCOUNTER — Ambulatory Visit (HOSPITAL_COMMUNITY)
Admission: EM | Admit: 2017-12-26 | Discharge: 2017-12-26 | Disposition: A | Discharge status: Home / Self Care | Payer: Medicaid Other | Attending: Family Medicine | Admitting: Family Medicine

## 2017-12-26 ENCOUNTER — Encounter (HOSPITAL_COMMUNITY): Payer: Self-pay

## 2017-12-26 ENCOUNTER — Telehealth: Payer: Self-pay

## 2017-12-26 DIAGNOSIS — I10 Essential (primary) hypertension: Secondary | ICD-10-CM

## 2017-12-26 DIAGNOSIS — R05 Cough: Secondary | ICD-10-CM | POA: Insufficient documentation

## 2017-12-26 DIAGNOSIS — R058 Other specified cough: Secondary | ICD-10-CM

## 2017-12-26 DIAGNOSIS — T464X5A Adverse effect of angiotensin-converting-enzyme inhibitors, initial encounter: Secondary | ICD-10-CM | POA: Insufficient documentation

## 2017-12-26 MED ORDER — LOSARTAN POTASSIUM 25 MG PO TABS: 25.0000 mg | ORAL_TABLET | Freq: Every day | ORAL | 0 refills | Status: DC

## 2017-12-26 NOTE — Telephone Encounter (Signed)
t called nurse line checking on her PT referral that was placed on 12/2. Per chart review, the referral was placed in workQ on 12/6. I informed patient of this and someone from their office should be contacting her soon.   Will forward to our referral coordinator to FU.

## 2017-12-26 NOTE — Telephone Encounter (Signed)
Patient would like to know if referral is done yet.  She states she was going to be referred to Physical Therapy she hasn't heard anything yet on the referral.  Please call her.

## 2017-12-26 NOTE — ED Triage Notes (Signed)
Pt present coughing non productive with tightness in her chest when she is coughing.  Symptoms has been going on for over month.

## 2017-12-26 NOTE — Discharge Instructions (Addendum)
Stop the lisinopril Take losartan every morning in its place Call your PCP to get refills

## 2017-12-26 NOTE — ED Provider Notes (Signed)
[place]    CSN: [id number] Arrival date & time: 12/26/17  6948     History   Chief Complaint Chief Complaint  Patient presents with  . Cough  . Shortness of Breath    HPI Nedda P Knipfer is a 32 y.o. female.   HPI   Patient states she is developed a cough.  Is been present for about 2 and half months.  She states that it is a dry cough.  Mostly during the day.  No fever or chills.  No chest pain.  She feels a little more short of breath when she tries to walk fast.  No fever.  No sore throat runny nose or congestion.  No headache. Patient is an insulin-dependent diabetic.  She was started on lisinopril for blood pressure and to protect her kidneys a few months back. I explained her that lisinopril can have a side effect of a cough.  Past Medical History:  Diagnosis Date  . Diabetes mellitus    Type 2, insulin resistant    Patient Active Problem List   Diagnosis Date Noted  . Back pain 12/12/2017  . Lower extremity edema 08/29/2017  . Encounter for vitamin deficiency screening 08/16/2016  . 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 Boil 06/06/2015  . Syncope 04/08/2015  . Irregular menstrual cycle 07/04/2014  . Essential hypertension, benign 12/12/2013  . Annual physical exam 06/27/2013  . Morbid obesity ([place]) 05/25/2013  . Moderate nonproliferative diabetic retinopathy ([place]) 05/24/2013  . Diabetic neuropathy, type II diabetes mellitus ([place]) 05/23/2013  . Toenail fungus 01/17/2013  . Type II diabetes mellitus with complication, uncontrolled ([place]) 12/27/2012  . Depression 12/27/2012    Past Surgical History:  Procedure Laterality Date  . CERVICAL BIOPSY  2004  . CESAREAN SECTION N/A 06/02/2012   Procedure: CESAREAN SECTION;  Surgeon: Mora Bellman, MD;  Location: [facility];  Service: Obstetrics;  Laterality: N/A;    OB History    Gravida  1   Para  1   Term  1   Preterm      AB      Living  1     SAB      TAB      Ectopic      Multiple      Live Births  1            Home Medications    Prior to Admission medications   Medication Sig Start Date End Date Taking? Authorizing Provider  ACCU-CHEK FASTCLIX LANCETS MISC Check sugar 5 x daily 12/21/16   Carlyle Dolly, MD  Cholecalciferol 50000 units capsule Take 1 capsule (50,000 Units total) by mouth every 7 (seven) days. 09/03/16   Carlyle Dolly, MD  gabapentin (NEURONTIN) 100 MG capsule Take 1 capsule (100 mg total) by mouth at bedtime. 12/02/17   Caroline More, DO  glucose blood (ACCU-CHEK SMARTVIEW) test strip Check sugar 5 x daily 12/21/16   Carlyle Dolly, MD  insulin aspart (NOVOLOG FLEXPEN) 100 UNIT/ML FlexPen INJECT 20 units with breakfast and lunch and 15 units with dinner. 12/02/17   Caroline More, DO  Insulin Glargine (LANTUS SOLOSTAR) 100 UNIT/ML Solostar Pen Inject 35 Units into the skin 2 (two) times daily. 12/02/17   Caroline More, DO  Insulin Pen Needle (B-D ULTRAFINE III SHORT PEN) 31G X 8 MM MISC 1 Container by Does not apply route as needed. 05/16/17   Rogue Bussing, MD  Insulin Syringe-Needle U-100 Flossie Buffy INSULIN SYRINGE) 31G X 5/16" 1  ML MISC Use to inject insulin. 08/16/16   Carlyle Dolly, MD  Lancets Misc. (ACCU-CHEK SOFTCLIX LANCET DEV) KIT 1 application by Does not apply route 2 (two) times daily. 12/15/16   Carlyle Dolly, MD  losartan (COZAAR) 25 MG tablet Take 1 tablet (25 mg total) by mouth daily. 12/26/17   Raylene Everts, MD    Family History Family History  Problem Relation Age of Onset  . Diabetes Father     Social History Social History   Tobacco Use  . Smoking status: Never Smoker  . Smokeless tobacco: Never Used  Substance Use Topics  . Alcohol use: No  . Drug use: No     Allergies   Lisinopril   Review of Systems Review of Systems  Constitutional: Negative for chills and fever.  HENT: Negative for ear pain and sore throat.   Eyes: Negative for pain and visual disturbance.    Respiratory: Positive for cough. Negative for shortness of breath.   Cardiovascular: Negative for chest pain and palpitations.  Gastrointestinal: Negative for abdominal pain and vomiting.  Genitourinary: Negative for dysuria and hematuria.  Musculoskeletal: Negative for arthralgias and back pain.  Skin: Negative for color change and rash.  Neurological: Negative for seizures and syncope.  All other systems reviewed and are negative.    Physical Exam Triage Vital Signs ED Triage Vitals [12/26/17 1029]  Enc Vitals Group     BP (!) 155/90     Pulse Rate 95     Resp 18     Temp 98.3 F (36.8 C)     Temp Source Oral     SpO2 99 %     Weight      Height      Head Circumference      Peak Flow      Pain Score      Pain Loc      Pain Edu?      Excl. in [place]?    No data found.  Updated Vital Signs BP (!) 155/90 (BP Location: Left Arm)   Pulse 95   Temp 98.3 F (36.8 C) (Oral)   Resp 18   LMP 12/19/2017   SpO2 99%    Physical Exam Constitutional:      General: She is not in acute distress.    Appearance: She is well-developed. She is obese.  HENT:     Head: Normocephalic and atraumatic.  Eyes:     Conjunctiva/sclera: Conjunctivae normal.     Pupils: Pupils are equal, round, and reactive to light.  Neck:     Musculoskeletal: Normal range of motion.  Cardiovascular:     Rate and Rhythm: Normal rate.  Pulmonary:     Effort: Pulmonary effort is normal. No respiratory distress.     Breath sounds: Normal breath sounds. No decreased breath sounds, wheezing, rhonchi or rales.  Chest:     Chest wall: No tenderness.  Abdominal:     General: There is no distension.     Palpations: Abdomen is soft.  Musculoskeletal: Normal range of motion.  Skin:    General: Skin is warm and dry.  Neurological:     General: No focal deficit present.     Mental Status: She is alert.  Psychiatric:        Mood and Affect: Mood normal.        Behavior: Behavior normal.      UC  Treatments / Results  Labs (all labs ordered are listed, but  only abnormal results are displayed) Labs Reviewed - No data to display  EKG None  Radiology No results found.  Procedures Procedures (including critical care time)  Medications Ordered in UC Medications - No data to display  Initial Impression / Assessment and Plan / UC Course  I have reviewed the triage vital signs and the nursing notes.  Pertinent labs & imaging results that were available during my care of the patient were reviewed by me and considered in my medical decision making (see chart for details).     Avoid have the patient discontinue her lisinopril and start losartan.  She needs to call her family practice doctor to let them know that this medicine changes been made, and for medicine refills.  She needs to see them in 2 weeks for a blood pressure check to make sure the losartan is working. Final Clinical Impressions(s) / UC Diagnoses   Final diagnoses:  Cough due to ACE inhibitor     Discharge Instructions     Stop the lisinopril Take losartan every morning in its place Call your PCP to get refills   ED Prescriptions    Medication Sig Dispense Auth. Provider   losartan (COZAAR) 25 MG tablet Take 1 tablet (25 mg total) by mouth daily. 90 tablet Raylene Everts, MD     Controlled Substance Prescriptions [place] State Controlled Substance Registry consulted? Not Applicable   Raylene Everts, MD 12/26/17 1053

## 2017-12-30 ENCOUNTER — Ambulatory Visit: Payer: Medicaid Other | Admitting: Pharmacist

## 2018-01-09 ENCOUNTER — Ambulatory Visit: Payer: Medicaid Other

## 2018-01-13 ENCOUNTER — Ambulatory Visit: Payer: Medicaid Other | Attending: Family Medicine | Admitting: Physical Therapy

## 2018-01-13 ENCOUNTER — Encounter: Payer: Self-pay | Admitting: Physical Therapy

## 2018-01-13 ENCOUNTER — Other Ambulatory Visit: Payer: Self-pay

## 2018-01-13 DIAGNOSIS — M62830 Muscle spasm of back: Secondary | ICD-10-CM

## 2018-01-13 DIAGNOSIS — M545 Low back pain: Secondary | ICD-10-CM | POA: Insufficient documentation

## 2018-01-13 DIAGNOSIS — R293 Abnormal posture: Secondary | ICD-10-CM | POA: Insufficient documentation

## 2018-01-13 DIAGNOSIS — G8929 Other chronic pain: Secondary | ICD-10-CM

## 2018-01-13 NOTE — Therapy (Signed)
[facility] Outpatient Rehabilitation [facility] [address] [place], [place], [address] Phone: [phone]   Fax:  [phone]  Physical Therapy Evaluation  Patient Details  Name: Karina [name] MRN: [id number] Date of Birth: 1985-03-29 Referring Provider (PT): McDiarmid, Leighton Roach, MD (seeing Randye Lobo DO)   Encounter Date: 01/13/2018  PT End of Session - 01/13/18 1029    Visit Number  1    Number of Visits  13    Date for PT Re-Evaluation  02/03/18    Authorization Type  MCD (resubmit at 4th visit)    PT Start Time  [phone]   pt arrived 13 min late   PT Stop Time  1015    PT Time Calculation (min)  32 min    Activity Tolerance  Patient tolerated treatment well    Behavior During Therapy  [facility] for tasks assessed/performed       Past Medical History:  Diagnosis Date  . Diabetes mellitus    Type 2, insulin resistant    Past Surgical History:  Procedure Laterality Date  . CERVICAL BIOPSY  2004  . CESAREAN SECTION N/A 06/02/2012   Procedure: CESAREAN SECTION;  Surgeon: Catalina Antigua, MD;  Location: [facility];  Service: Obstetrics;  Laterality: N/A;    There were no vitals filed for this visit.   Subjective Assessment - 01/13/18 0951    Subjective  pt is a 33 y.o F with CC low back pain following a fall that occurred on 12/03/2017 due to slipping on the the floor due to it being slip and she fell on her back and L side. Since the fall the pain has gotten worse and the back seems to be more spasm. pt notes pain has moved to there low back glute on the L. pt denies any red flags    Limitations  Sitting;Standing;Walking    How long can you sit comfortably?  30 min     How long can you stand comfortably?  3-5 min     How long can you walk comfortably?  15-20 min    Diagnostic tests  x-ray 12/04/2017 for the LUE    Patient Stated Goals  to reduce pain, picking up daugther with no pain    Currently in Pain?  Yes    Pain Score  6    at worst 9-10/10   Pain  Location  Back    Pain Orientation  Left;Lower;Right    Pain Descriptors / Indicators  Aching;Tightness;Throbbing;Constant   vibrating   Pain Type  Chronic pain    Pain Onset  More than a month ago    Pain Frequency  Constant    Aggravating Factors   getting into / out of the car, picking up items, bending forward, deep breathing, direct     Pain Relieving Factors  medication, ice,     Effect of Pain on Daily Activities  limited standing/ walking          [facility] PT Assessment - 01/13/18 0001      Assessment   Medical Diagnosis  Other acute back pain     Referring Provider (PT)  McDiarmid, Leighton Roach, MD   seeing Randye Lobo DO   Onset Date/Surgical Date  12/03/17    Hand Dominance  Right    Next MD Visit  making one PRN    Prior Therapy  yes      Precautions   Precautions  None      Restrictions   Weight Bearing  Restrictions  No      Balance Screen   Has the patient fallen in the past 6 months  Yes    How many times?  1    Has the patient had a decrease in activity level because of a fear of falling?   No    Is the patient reluctant to leave their home because of a fear of falling?   No      Home Public house manager residence    Chemical engineer;Children    Available Help at Discharge  Family;Available PRN/intermittently    Type of Home  House    Home Access  Stairs to enter    Entrance Stairs-Number of Steps  4    Entrance Stairs-Rails  Cannot reach both    Home Layout  One level    Home Equipment  Crutches;Cane - single point      Prior Function   Level of Independence  Independent    Vocation  Full time employment   [employer]   Vocation Requirements  prolonged sitting    Leisure  dancing, having fun with daughter, walking, water aerobics      Cognition   Overall Cognitive Status  Within Functional Limits for tasks assessed      Posture/Postural Control   Posture/Postural Control  Postural limitations     Postural Limitations  Rounded Shoulders;Forward head;Decreased lumbar lordosis      ROM / Strength   AROM / PROM / Strength  AROM;Strength      AROM   AROM Assessment Site  Lumbar    Lumbar Flexion  48   ER concordant pain   Lumbar Extension  20   ER concordant pain   Lumbar - Right Side Bend  12    Lumbar - Left Side Bend  18      Strength   Strength Assessment Site  Hip;Knee    Right/Left Hip  Right;Left    Right/Left Knee  Right;Left      Palpation   Palpation comment  TTP along bil lumbar paraspinals L >R, bil SIJ pain  L>R      Ambulation/Gait   Ambulation/Gait  Yes    Gait Pattern  Step-through pattern;Decreased stride length;Antalgic;Trendelenburg                Objective measurements completed on examination: See above findings.              PT Education - 01/13/18 1029    Education Details  assessment findings, POC, goals, HEP with proper form / rationale.     Person(s) Educated  Patient    Methods  Explanation;Verbal cues;Handout    Comprehension  Verbalized understanding;Verbal cues required       PT Short Term Goals - 01/13/18 1041      PT SHORT TERM GOAL #1   Title  pt to be I with inital HEP     Baseline  no previous HEP    Time  3    Period  Weeks    Status  New    Target Date  02/03/18      PT SHORT TERM GOAL #2   Title  pt to verbalize and demo proper posture and lifting mechanics and to reduce and prevent low back pain     Baseline  no knowledge of proper lifting    Time  3    Period  Weeks    Status  New  Target Date  02/03/18      PT SHORT TERM GOAL #3   Title  pt to demo decreased low back spams to decrease pain to </= 3/10 pain and promote trunk mobility     Baseline  4/10 pain with significant spasm in the low back    Time  3    Period  Weeks    Status  New    Target Date  02/03/18        PT Long Term Goals - 01/13/18 1043      PT LONG TERM GOAL #1   Title  increase trunk flexion and R sidebending by  >/= 10 degrees with </= 2/10 pain for functional mobility required for ADLs     Baseline  flexion 48 R sidebending 12, L sidebend 18    Time  6    Period  Weeks    Status  New    Target Date  03/03/18      PT LONG TERM GOAL #2   Title  pt to be able to sit/ stand and walk for >/= 60 min with </= 2/10 pain for functional endurance required for work related activities and ADLs    Baseline  sitting 30 min, standing 3-5 and walking, 15-20 min     Time  6    Period  Weeks    Status  New    Target Date  03/03/18      PT LONG TERM GOAL #3   Title  pt to be able to return to exercise with </= 1/10 pain for pt's personasl goals    Baseline  unable to exercise due to pain     Time  6    Period  Weeks    Status  New    Target Date  03/03/18      PT LONG TERM GOAL #4   Title  pt to be I with all HEP given as of last visit to maintain and progress current level of function     Baseline  no previous HEP    Time  6    Period  Weeks    Status  New    Target Date  03/03/18             Plan - 01/13/18 1034    Clinical Impression Statement  pt is a pleasent 33 y.o F presenting to OPPT with CC of low back pain following a fall that occurred on 12/03/2017. limited assessment due to pt running late. She demosntrates limited trunk mobility with pain at end ranges, TTP along bil lumbar paraspinals and bil SIJ. weakness in bil LE due to pain and tightness in the low back. she would benefit from physical therapy to decrease low back pain, improve mobility, and return to PLOF by addressing the deficits listed.     History and Personal Factors relevant to plan of care:  hx of DM, and obesity    Clinical Presentation  Evolving    Clinical Presentation due to:  abnormal posture, limited trunk mobility, muscle spasm,     Clinical Decision Making  Moderate    Rehab Potential  Good    PT Frequency  2x / week    PT Duration  6 weeks   initial auth 1 x week for 3 weeks   PT Treatment/Interventions   ADLs/Self Care Home Management;Cryotherapy;Electrical Stimulation;Iontophoresis 4mg /ml Dexamethasone;Moist Heat;Traction;Ultrasound;Taping;Therapeutic activities;Therapeutic exercise;Dry needling;Patient/family education;Manual techniques;Passive range of motion;Gait training    PT Next Visit Plan  review/ update HEP, STW along bil lumbar paraspinals, hamstring stretching, hip strengthening, posture education     PT Home Exercise Plan  low back stretch, hamstring stretching, isometric hip adduction, lower trunk rotaiton, supine marching    Consulted and Agree with Plan of Care  Patient       Patient will benefit from skilled therapeutic intervention in order to improve the following deficits and impairments:  Pain, Obesity, Increased fascial restricitons, Increased muscle spasms, Postural dysfunction, Improper body mechanics, Decreased range of motion, Decreased strength, Decreased endurance, Decreased activity tolerance  Visit Diagnosis: Chronic bilateral low back pain without sciatica  Muscle spasm of back  Abnormal posture     Problem List Patient Active Problem List   Diagnosis Date Noted  . Back pain 12/12/2017  . Lower extremity edema 08/29/2017  . Encounter for vitamin deficiency screening 08/16/2016  . 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 Boil 06/06/2015  . Syncope 04/08/2015  . Irregular menstrual cycle 07/04/2014  . Essential hypertension, benign 12/12/2013  . Annual physical exam 06/27/2013  . Morbid obesity (HCC) 05/25/2013  . Moderate nonproliferative diabetic retinopathy (HCC) 05/24/2013  . Diabetic neuropathy, type II diabetes mellitus (HCC) 05/23/2013  . Toenail fungus 01/17/2013  . Type II diabetes mellitus with complication, uncontrolled (HCC) 12/27/2012  . Depression 12/27/2012   Lulu Riding PT, DPT, LAT, ATC  01/13/18  10:49 AM      [facility] Outpatient Rehabilitation [facility] [address] [place], [place], [address] Phone: [phone]    Fax:  [phone]  Name: Karina [name] MRN: [id number] Date of Birth: Sep 26, 1985

## 2018-01-24 ENCOUNTER — Ambulatory Visit: Payer: Medicaid Other | Admitting: Physical Therapy

## 2018-02-02 ENCOUNTER — Ambulatory Visit: Payer: Medicaid Other | Admitting: Physical Therapy

## 2018-02-07 ENCOUNTER — Ambulatory Visit: Payer: Medicaid Other | Admitting: Physical Therapy

## 2018-02-07 ENCOUNTER — Encounter: Payer: Self-pay | Admitting: Physical Therapy

## 2018-02-07 DIAGNOSIS — M62830 Muscle spasm of back: Secondary | ICD-10-CM

## 2018-02-07 DIAGNOSIS — G8929 Other chronic pain: Secondary | ICD-10-CM | POA: Diagnosis not present

## 2018-02-07 DIAGNOSIS — R293 Abnormal posture: Secondary | ICD-10-CM | POA: Diagnosis not present

## 2018-02-07 DIAGNOSIS — M545 Low back pain: Secondary | ICD-10-CM | POA: Diagnosis not present

## 2018-02-07 NOTE — Patient Instructions (Signed)

## 2018-02-07 NOTE — Therapy (Signed)
[facility] Outpatient Rehabilitation [facility] [address] [place], [place], [address] Phone: [phone]   Fax:  [phone]  Physical Therapy Treatment  Patient Details  Name: Karina [name] MRN: [id number] Date of Birth: 1985-09-19 Referring Provider (PT): McDiarmid, Leighton Roach, MD (seeing Randye Lobo DO)   Encounter Date: 02/07/2018  PT End of Session - 02/07/18 1031    Visit Number  2    Number of Visits  13    Date for PT Re-Evaluation  02/03/18    Authorization Type  MCD (resubmit at 4th visit)    Authorization Time Period  approved 3 visits 01/23/2018 - 02/12/2018    Authorization - Visit Number  1    Authorization - Number of Visits  3    PT Start Time  1030   pt arrived 30 min late   PT Stop Time  1058    PT Time Calculation (min)  28 min    Activity Tolerance  Patient tolerated treatment well    Behavior During Therapy  [facility] for tasks assessed/performed       Past Medical History:  Diagnosis Date  . Diabetes mellitus    Type 2, insulin resistant    Past Surgical History:  Procedure Laterality Date  . CERVICAL BIOPSY  2004  . CESAREAN SECTION N/A 06/02/2012   Procedure: CESAREAN SECTION;  Surgeon: Catalina Antigua, MD;  Location: [facility];  Service: Obstetrics;  Laterality: N/A;    There were no vitals filed for this visit.  Subjective Assessment - 02/07/18 1031    Subjective  pt reports she hasn't been in for a while due to insurance complications and she was waiting for it to get cleared. "I am not doing as bad until I move or do something"    Patient Stated Goals  to reduce pain, picking up daugther with no pain    Currently in Pain?  Yes    Pain Score  0-No pain   at worst 10/10   Pain Orientation  Right;Left;Lower    Pain Type  Chronic pain    Aggravating Factors   quick movements, rushing around, doing too much                       [facility] Adult PT Treatment/Exercise - 02/07/18 0001      Self-Care   Self-Care   Posture    Posture  lifting carrying and overal posture mechanics, goal of maintaining proper spinal curves   combined with associated handout     Lumbar Exercises: Stretches   Other Lumbar Stretch Exercise  seated low back stretch 2 x 30 seconds   walking hands out on chair     Lumbar Exercises: Seated   Other Seated Lumbar Exercises  anterior pelvic tilt 1 x 10 hoding 5 seconds    Other Seated Lumbar Exercises  hip adduction isometric bil 1 x 10 holding 3 seconds             PT Education - 02/07/18 1209    Education Details  reviewed previously provided HEP and posture/ lifting mechanics    Person(s) Educated  Patient    Methods  Explanation;Verbal cues    Comprehension  Verbalized understanding;Verbal cues required       PT Short Term Goals - 01/13/18 1041      PT SHORT TERM GOAL #1   Title  pt to be I with inital HEP     Baseline  no previous HEP  Time  3    Period  Weeks    Status  New    Target Date  02/03/18      PT SHORT TERM GOAL #2   Title  pt to verbalize and demo proper posture and lifting mechanics and to reduce and prevent low back pain     Baseline  no knowledge of proper lifting    Time  3    Period  Weeks    Status  New    Target Date  02/03/18      PT SHORT TERM GOAL #3   Title  pt to demo decreased low back spams to decrease pain to </= 3/10 pain and promote trunk mobility     Baseline  4/10 pain with significant spasm in the low back    Time  3    Period  Weeks    Status  New    Target Date  02/03/18        PT Long Term Goals - 01/13/18 1043      PT LONG TERM GOAL #1   Title  increase trunk flexion and R sidebending by >/= 10 degrees with </= 2/10 pain for functional mobility required for ADLs     Baseline  flexion 48 R sidebending 12, L sidebend 18    Time  6    Period  Weeks    Status  New    Target Date  03/03/18      PT LONG TERM GOAL #2   Title  pt to be able to sit/ stand and walk for >/= 60 min with </= 2/10 pain for  functional endurance required for work related activities and ADLs    Baseline  sitting 30 min, standing 3-5 and walking, 15-20 min     Time  6    Period  Weeks    Status  New    Target Date  03/03/18      PT LONG TERM GOAL #3   Title  pt to be able to return to exercise with </= 1/10 pain for pt's personasl goals    Baseline  unable to exercise due to pain     Time  6    Period  Weeks    Status  New    Target Date  03/03/18      PT LONG TERM GOAL #4   Title  pt to be I with all HEP given as of last visit to maintain and progress current level of function     Baseline  no previous HEP    Time  6    Period  Weeks    Status  New    Target Date  03/03/18            Plan - 02/07/18 1209    Clinical Impression Statement  pt arrived 15 min late today. she reports being consistent with exercise but continues to report pain with quick movements. focused on postural education and strengthening to promote good posture. She reported decresaed soreness at end of session. plan to reassess and resubmit to MCD next session.     PT Next Visit Plan  review/ update HEP, STW along bil lumbar paraspinals, hamstring stretching, hip strengthening,     PT Home Exercise Plan  low back stretch, hamstring stretching, isometric hip adduction, lower trunk rotaiton, supine marching, seated pelvic tilt, posture education    Consulted and Agree with Plan of Care  Patient  Patient will benefit from skilled therapeutic intervention in order to improve the following deficits and impairments:  Pain, Obesity, Increased fascial restricitons, Increased muscle spasms, Postural dysfunction, Improper body mechanics, Decreased range of motion, Decreased strength, Decreased endurance, Decreased activity tolerance  Visit Diagnosis: Chronic bilateral low back pain without sciatica  Muscle spasm of back  Abnormal posture     Problem List Patient Active Problem List   Diagnosis Date Noted  . Back pain  12/12/2017  . Lower extremity edema 08/29/2017  . Encounter for vitamin deficiency screening 08/16/2016  . 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 Boil 06/06/2015  . Syncope 04/08/2015  . Irregular menstrual cycle 07/04/2014  . Essential hypertension, benign 12/12/2013  . Annual physical exam 06/27/2013  . Morbid obesity (HCC) 05/25/2013  . Moderate nonproliferative diabetic retinopathy (HCC) 05/24/2013  . Diabetic neuropathy, type II diabetes mellitus (HCC) 05/23/2013  . Toenail fungus 01/17/2013  . Type II diabetes mellitus with complication, uncontrolled (HCC) 12/27/2012  . Depression 12/27/2012   Lulu RidingKristoffer Wandalene Abrams PT, DPT, LAT, ATC  02/07/18  12:13 PM      [facility] [address] [place], [place], [address] Phone: [phone]   Fax:  [phone]  Name: Karina [name] MRN: [id number] Date of Birth: 1985/11/22

## 2018-02-13 ENCOUNTER — Ambulatory Visit: Payer: Medicaid Other | Admitting: Physical Therapy

## 2018-03-06 ENCOUNTER — Ambulatory Visit: Payer: Medicaid Other | Attending: Family Medicine | Admitting: Physical Therapy

## 2018-03-06 ENCOUNTER — Encounter: Payer: Self-pay | Admitting: Physical Therapy

## 2018-03-06 DIAGNOSIS — R293 Abnormal posture: Secondary | ICD-10-CM

## 2018-03-06 DIAGNOSIS — M545 Low back pain, unspecified: Secondary | ICD-10-CM

## 2018-03-06 DIAGNOSIS — G8929 Other chronic pain: Secondary | ICD-10-CM | POA: Diagnosis not present

## 2018-03-06 DIAGNOSIS — M62830 Muscle spasm of back: Secondary | ICD-10-CM | POA: Diagnosis not present

## 2018-03-06 NOTE — Addendum Note (Signed)
Addended by: Milford Cage on: 03/06/2018 09:32 AM   Modules accepted: Orders

## 2018-03-06 NOTE — Progress Notes (Signed)
  Subjective:   Patient ID: Karina [name]    DOB: 26-Sep-1985, 33 y.o. female   MRN: [id number]  Karina [name] is a 33 y.o. female with a history of HTN, T2DM, morbid obesity, depression here for   Back Pain - Seen previously 12/2017 s/p fall. At that time, negative shoulder/elbow imaging in ED. D/ced from PT due to lack of progress and attendance. Minimal progress noted with STG, no LTGs met. Regressed in progress with side bending. - She is doing stretches (pelvic tilt) provided by PT at home which are helping some. She is not using a heating pad but notes the heated seats in her care help. - Pain is mainly located in upper back bilaterally and lower back bilaterally without radiation or sciatica, exacerbated with sitting. Standing makes it better. Does not lift heavy objects. - Denies fevers. Pain only wakes her from sleep if she is rolling over. No rashes.  - taking tylenol and ibuprofen prn   Review of Systems:  Per HPI.  [place], medications and smoking status reviewed.  Objective:   BP 118/70   Temp 98.1 F (36.7 C) (Oral)   Wt 224 lb (101.6 kg)   LMP 02/18/2018 (Exact Date)   BMI 40.97 kg/m  Vitals and nursing note reviewed.  General: morbidly obese, in no acute distress with non-toxic appearance CV: regular rate and rhythm without murmurs, rubs, or gallops Lungs: clear to auscultation bilaterally with normal work of breathing Skin: warm, dry, no rashes or lesions Extremities: warm and well perfused, normal tone MSK: ROM greatly limited with forward flexion, side bending bilaterally. LE strength intact, gait normal. Slightly tender to palpation to upper back. Hypertonicity noted to trapezius bilaterally. Neuro: Alert and oriented, speech normal  Assessment & Plan:   Back pain Ongoing s/p fall with minimal improvement with PT. Patient prefers to only use tylenol/ibuprofen for pain relief, does not want anything addicting or sedating. Again advised using heating pad prn.  Given mechanism of injury, may have injury to rhomboids and paraspinal musculature, demonstrated and encouraged stretching and strengthening exercises. Would likely benefit from Sports Med eval as well.  Orders Placed This Encounter  Procedures  . Ambulatory referral to Sports Medicine    Referral Priority:   Routine    Referral Type:   Consultation    Number of Visits Requested:   1  . HgB A1c   No orders of the defined types were placed in this encounter.  Precepted with Dr. Nori Riis.  Rory Percy, DO PGY-2, [facility] 03/07/2018 8:43 PM

## 2018-03-06 NOTE — Therapy (Addendum)
[place], [place], [address] Phone: [phone]   Fax:  [phone]  Physical Therapy Treatment / Discharge Summary  Patient Details  Name: Karina [name] MRN: [id number] Date of Birth: September 10, 1985 Referring Provider (PT): McDiarmid, Blane Ohara, MD (seeing Shela Commons DO)   Encounter Date: 03/06/2018  PT End of Session - 03/06/18 0859    Visit Number  3    Number of Visits  13    Date for PT Re-Evaluation  03/06/18    Authorization Type  MCD (resubmit at 4th visit)    PT Start Time  0856    PT Stop Time  0925   pt arrived 11 min    PT Time Calculation (min)  29 min    Activity Tolerance  Patient tolerated treatment well       Past Medical History:  Diagnosis Date  . Diabetes mellitus    Type 2, insulin resistant    Past Surgical History:  Procedure Laterality Date  . CERVICAL BIOPSY  2004  . CESAREAN SECTION N/A 06/02/2012   Procedure: CESAREAN SECTION;  Surgeon: Mora Bellman, MD;  Location: [facility];  Service: Obstetrics;  Laterality: N/A;    There were no vitals filed for this visit.  Subjective Assessment - 03/06/18 0859    Subjective  "I wasn't able to make it to the last visit due to funeral. I have noticed a new issue in my chest when I go to get into the car and I have to take time and sit for a bit to let it calm down. I have days where I am doing good, and other days I am having trouble." pt reports she is doing 1 x a day.    How long can you sit comfortably?  45 min    How long can you stand comfortably?  3-5 min     How long can you walk comfortably?  3-5 min    Diagnostic tests  x-ray 12/04/2017 for the LUE    Patient Stated Goals  to reduce pain, picking up daugther with no pain    Currently in Pain?  Yes    Pain Score  5     Pain Location  Back    Pain Orientation  Right;Left;Lower    Pain Descriptors / Indicators  Aching;Tightness;Sore    Pain Type  Chronic pain    Pain  Onset  More than a month ago    Pain Frequency  Intermittent    Aggravating Factors   quick movements, sitting in the car,     Pain Relieving Factors  medication, ice         [facility] PT Assessment - 03/06/18 0904      AROM   Lumbar Flexion  66   End range pain   Lumbar Extension  20   ERP   Lumbar - Right Side Bend  6   PDM   Lumbar - Left Side Bend  6   PDM                          PT Education - 03/06/18 [id number]    Education Details  reviewed previous HEP, and benefits of continued strengthening/ stretch to promote relief    Person(s) Educated  Patient    Methods  Explanation;Verbal cues    Comprehension  Verbalized understanding;Verbal cues required       PT Short Term Goals - 03/06/18 6629  PT SHORT TERM GOAL #1   Title  pt to be I with inital HEP     Baseline  reports she does 1-2 exercises daily    Time  3    Period  Weeks    Status  Achieved      PT SHORT TERM GOAL #2   Title  pt to verbalize and demo proper posture and lifting mechanics and to reduce and prevent low back pain     Baseline  reports she has been working     Time  3    Period  Weeks    Status  Achieved      PT SHORT TERM GOAL #3   Title  pt to demo decreased low back spams to decrease pain to </= 3/10 pain and promote trunk mobility     Baseline  5/10 with intermittent spasm in the back     Period  Weeks    Status  Not Met        PT Long Term Goals - 03/06/18 0911      PT LONG TERM GOAL #1   Title  increase trunk flexion and R sidebending by >/= 10 degrees with </= 2/10 pain for functional mobility required for ADLs     Baseline  flexion 66, R side bending 6 degrees    Time  6    Period  Weeks    Status  Partially Met      PT LONG TERM GOAL #2   Title  pt to be able to sit/ stand and walk for >/= 60 min with </= 2/10 pain for functional endurance required for work related activities and ADLs    Baseline  sitting 30 min, standing 3-5 min and walking pt reports  unsure but a couple of minutes    Time  6    Period  Weeks    Status  Not Met      PT LONG TERM GOAL #3   Title  pt to be able to return to exercise with </= 1/10 pain for pt's personasl goals    Baseline  unable to exercise due to pain     Time  6    Period  Weeks    Status  Not Met      PT LONG TERM GOAL #4   Title  pt to be I with all HEP given as of last visit to maintain and progress current level of function     Baseline  current with HEP    Time  6    Period  Weeks    Status  Not Met            Plan - 03/06/18 1497    Clinical Impression Statement  pt arrived 11 min late today. She demonstrates improvement with trunk flexion compared to previous measures, but has regression with sidebending and continues to report constant fluctuating pain with no specific carrying with exercise or stretching. pt exhibits limited attendance with mulitple gaps between appointments. she has made minimal progress toward STG's only and hasn't met any LTG's today. Due to continous fluctuating pain, limited funcitonal progress and attendance plan to refer back to her MD for further assessment.     PT Treatment/Interventions  ADLs/Self Care Home Management;Cryotherapy;Electrical Stimulation;Iontophoresis '4mg'$ /ml Dexamethasone;Moist Heat;Traction;Ultrasound;Taping;Therapeutic activities;Therapeutic exercise;Dry needling;Patient/family education;Manual techniques;Passive range of motion;Gait training    PT Next Visit Plan  review/ update HEP, STW along bil lumbar paraspinals, hamstring stretching, hip strengthening,  PT Home Exercise Plan  low back stretch, hamstring stretching, isometric hip adduction, lower trunk rotaiton, supine marching, seated pelvic tilt, posture education    Consulted and Agree with Plan of Care  Patient       Patient will benefit from skilled therapeutic intervention in order to improve the following deficits and impairments:  Pain, Obesity, Increased fascial restricitons,  Increased muscle spasms, Postural dysfunction, Improper body mechanics, Decreased range of motion, Decreased strength, Decreased endurance, Decreased activity tolerance  Visit Diagnosis: Chronic bilateral low back pain without sciatica  Muscle spasm of back  Abnormal posture     Problem List Patient Active Problem List   Diagnosis Date Noted  . Back pain 12/12/2017  . Lower extremity edema 08/29/2017  . Encounter for vitamin deficiency screening 08/16/2016  . 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 Boil 06/06/2015  . Syncope 04/08/2015  . Irregular menstrual cycle 07/04/2014  . Essential hypertension, benign 12/12/2013  . Annual physical exam 06/27/2013  . Morbid obesity ([place]) 05/25/2013  . Moderate nonproliferative diabetic retinopathy ([place]) 05/24/2013  . Diabetic neuropathy, type II diabetes mellitus ([place]) 05/23/2013  . Toenail fungus 01/17/2013  . Type II diabetes mellitus with complication, uncontrolled ([place]) 12/27/2012  . Depression 12/27/2012    [place] 03/06/2018, 9:30 AM  [facility] [address] [place], [place], [address] Phone: [phone]   Fax:  [phone]  Name: Karina [name] MRN: [id number] Date of Birth: 1985-04-17       PHYSICAL THERAPY DISCHARGE SUMMARY  Visits from Start of Care: 3  Current functional level related to goals / functional outcomes: See goals   Remaining deficits: Continued low back pain with limited trunk mobility. Limited ability to lift and increased fear of activity/ worsening symptoms. See note.     Education / Equipment: HEP, posture  Plan: Patient agrees to discharge.  Patient goals were not met. Patient is being discharged due to lack of progress.  ?????         Ferrel Simington PT, DPT, LAT, ATC  03/06/18  9:30 AM

## 2018-03-07 ENCOUNTER — Ambulatory Visit: Payer: Self-pay | Admitting: Family Medicine

## 2018-03-07 ENCOUNTER — Other Ambulatory Visit: Payer: Self-pay

## 2018-03-07 ENCOUNTER — Encounter: Payer: Self-pay | Admitting: Family Medicine

## 2018-03-07 VITALS — BP 118/70 | Temp 98.1°F | Wt 224.0 lb

## 2018-03-07 DIAGNOSIS — E1165 Type 2 diabetes mellitus with hyperglycemia: Secondary | ICD-10-CM

## 2018-03-07 DIAGNOSIS — M545 Low back pain, unspecified: Secondary | ICD-10-CM

## 2018-03-07 DIAGNOSIS — IMO0002 Reserved for concepts with insufficient information to code with codable children: Secondary | ICD-10-CM

## 2018-03-07 DIAGNOSIS — E118 Type 2 diabetes mellitus with unspecified complications: Secondary | ICD-10-CM

## 2018-03-07 LAB — POCT GLYCOSYLATED HEMOGLOBIN (HGB A1C): HBA1C, POC (CONTROLLED DIABETIC RANGE): 13 % — AB (ref 0.0–7.0)

## 2018-03-07 NOTE — Patient Instructions (Signed)
It was great to see you!  Our plans for today:  - We are referring you to Sports Medicine for your back pain. Work on doing wall push-ups and upright rows until then. - Make an appointment with Dr. Frances Furbish for your diabetes.  Take care and seek immediate care sooner if you develop any concerns.   Dr. [facility]

## 2018-03-07 NOTE — Assessment & Plan Note (Addendum)
Ongoing s/p fall with minimal improvement with PT. Patient prefers to only use tylenol/ibuprofen for pain relief, does not want anything addicting or sedating. Again advised using heating pad prn. Given mechanism of injury, may have injury to rhomboids and paraspinal musculature, demonstrated and encouraged stretching and strengthening exercises. Would likely benefit from Sports Med eval as well.

## 2018-03-23 DIAGNOSIS — H9203 Otalgia, bilateral: Secondary | ICD-10-CM | POA: Insufficient documentation

## 2018-03-23 DIAGNOSIS — K219 Gastro-esophageal reflux disease without esophagitis: Secondary | ICD-10-CM | POA: Insufficient documentation

## 2018-03-23 DIAGNOSIS — R490 Dysphonia: Secondary | ICD-10-CM | POA: Insufficient documentation

## 2018-03-27 ENCOUNTER — Ambulatory Visit (INDEPENDENT_AMBULATORY_CARE_PROVIDER_SITE_OTHER): Payer: Self-pay | Admitting: Sports Medicine

## 2018-03-27 ENCOUNTER — Other Ambulatory Visit: Payer: Self-pay | Admitting: Sports Medicine

## 2018-03-27 ENCOUNTER — Ambulatory Visit
Admission: RE | Admit: 2018-03-27 | Discharge: 2018-03-27 | Disposition: A | Discharge status: Home / Self Care | Payer: Self-pay | Source: Ambulatory Visit | Attending: Sports Medicine | Admitting: Sports Medicine

## 2018-03-27 ENCOUNTER — Other Ambulatory Visit: Payer: Self-pay

## 2018-03-27 VITALS — BP 142/100 | Ht 62.0 in | Wt 222.0 lb

## 2018-03-27 DIAGNOSIS — M545 Low back pain, unspecified: Secondary | ICD-10-CM

## 2018-03-27 NOTE — Progress Notes (Signed)
   Subjective:    Patient ID: Karina [name], female    DOB: 1985/11/26, 33 y.o.   MRN: [id number]  New patient reporting that in late November patient had both feet fall from under her and she hit the top part of her back after slipping on some grease at work.  Patient reports that they did an x-ray of her upper left shoulder and elbow at that time which were normal.  She was referred to physical therapy which she has been doing since then.  Patient states that initially she was having numbness and tingling however she denies any of this anymore now.  Patient reports that she is having no weakness, no bowel or bladder incontinence.  Patient reports that her neck sometimes hurts with flexion.  She notes that she is unable to move most of her back due to pain.  It is limiting her daily activities.  Patient reports that she was seen at the family medicine clinic who referred her here stating that she may need further imaging of her back.  ROS per HPI     Objective:  Gen: NAD, comfortable in exam room   Upper, midline and lower Back: No gross deformity, scoliosis. TTP of upper, midline and lower back.  FROM with pain on hip flexion, internal and external rotation, extension and flexion of back Strength LEs 4/5 all muscle groups (poor effort given).   Sensation intact to light touch bilaterally.     Assessment & Plan:   Diffuse back pain: Thoracic, Lumbar and midline Will obtain 2 V of thoracic and lumbar spine as these were not obtained after inciting injury in November. Phone  follow up after imaging returns.  [place] Toleen Lachapelle, DO PGY-2, [facility]   Patient seen and evaluated with the resident.  I agree with the above plan of care.  X-rays of her lumbar and thoracic spine are reviewed.  Nothing acute is seen.  Very mild degenerative changes in the thoracic spine.  Reassurance is given.  She may benefit from a return to physical therapy.  I do not think she needs further  work-up or treatment at this point in time.  Follow-up as needed.

## 2018-03-28 ENCOUNTER — Encounter: Payer: Self-pay | Admitting: Sports Medicine

## 2018-03-28 ENCOUNTER — Telehealth: Payer: Self-pay | Admitting: Family Medicine

## 2018-03-28 ENCOUNTER — Other Ambulatory Visit: Payer: Self-pay | Admitting: Family Medicine

## 2018-03-28 DIAGNOSIS — R6889 Other general symptoms and signs: Secondary | ICD-10-CM

## 2018-03-28 NOTE — Progress Notes (Signed)
**  After Hours/ Emergency Line Call*  Received a call to report that Karina [name] is concerned for Covid-19 infection. She is unsure if she should go to the ED or clinic for testing. Endorsing chest tightness, shortness of breath, sore throat for the past 2 days. Reports a coworker was exposed to an [place] citizen who was likely infected. Her other coworkers are feeling badly with similar symptoms. She works at [employer]. She did not go to work today. Denies fevers or chills. Denies cough. No travel outside of [place] in past several weeks.  Recommended that she avoid ED unless she has an acute life threatening concern which she denies. I feel she should be screened at drive though test center. I have given her the address. I have placed this order per [facility] guidelines. Reviewed self isolation protocol with her and discussed test turn around time. Will forward to PCP, our lab director, and faculty on phone triage tomorrow morning.  Tillman Sers, DO PGY-3, [facility]

## 2018-03-29 NOTE — Telephone Encounter (Signed)
See orders documentation

## 2018-04-03 ENCOUNTER — Telehealth: Payer: Self-pay

## 2018-04-03 LAB — NOVEL CORONAVIRUS, NAA: SARS-CoV-2, NAA: NOT DETECTED

## 2018-04-03 NOTE — Telephone Encounter (Signed)
Returned Karina [name]'s call.  She says that her symptoms are improving and she is relieved that she does not have COVID.  We agreed that I will call her at her appointment time tomorrow at 9:30 AM to discuss her concerns rather than having her come into the clinic.

## 2018-04-03 NOTE — Telephone Encounter (Signed)
Pt called nurse line requesting her COVID results. I informed patient the results were negative, however the patient would still like to speak with her pcp.

## 2018-04-04 ENCOUNTER — Telehealth: Payer: Self-pay | Admitting: Family Medicine

## 2018-04-04 ENCOUNTER — Telehealth (INDEPENDENT_AMBULATORY_CARE_PROVIDER_SITE_OTHER): Payer: Self-pay | Admitting: Family Medicine

## 2018-04-04 ENCOUNTER — Other Ambulatory Visit: Payer: Self-pay

## 2018-04-04 DIAGNOSIS — IMO0002 Reserved for concepts with insufficient information to code with codable children: Secondary | ICD-10-CM

## 2018-04-04 DIAGNOSIS — Z794 Long term (current) use of insulin: Secondary | ICD-10-CM

## 2018-04-04 DIAGNOSIS — E1165 Type 2 diabetes mellitus with hyperglycemia: Secondary | ICD-10-CM

## 2018-04-04 DIAGNOSIS — E118 Type 2 diabetes mellitus with unspecified complications: Secondary | ICD-10-CM

## 2018-04-04 DIAGNOSIS — E1140 Type 2 diabetes mellitus with diabetic neuropathy, unspecified: Secondary | ICD-10-CM

## 2018-04-04 DIAGNOSIS — J069 Acute upper respiratory infection, unspecified: Secondary | ICD-10-CM

## 2018-04-04 MED ORDER — LIRAGLUTIDE 18 MG/3ML ~~LOC~~ SOPN: 1.2000 mg | PEN_INJECTOR | Freq: Every day | SUBCUTANEOUS | 2 refills | Status: DC

## 2018-04-04 MED ORDER — INSULIN ASPART 100 UNIT/ML FLEXPEN: PEN_INJECTOR | SUBCUTANEOUS | 0 refills | Status: DC

## 2018-04-04 NOTE — Telephone Encounter (Signed)
Encounter entered in error.

## 2018-04-05 DIAGNOSIS — J069 Acute upper respiratory infection, unspecified: Secondary | ICD-10-CM | POA: Insufficient documentation

## 2018-04-05 NOTE — Assessment & Plan Note (Signed)
Leg pain sounds clinically like cramps, but neuropathy could also be playing a role.  Possible that she could also have restless leg syndrome since her legs will move during these episodes, and they often occur when she is lying down.  Told patient to stay hydrated in order to mitigate cramps, keep track of symptoms, and let me know if this problem worsens.

## 2018-04-05 NOTE — Progress Notes (Signed)
[facility] Telemedicine Visit  Patient consented to have visit conducted via telephone.  Encounter participants: Patient: Karina [name]  Provider: Lennox Solders  Others (if applicable): none  Chief Complaint:  follow up of URI, bilateral leg pain, follow up of diabetes   HPI:  Recent URI -Needs letter for return to work after having a URI -Tested negative for novel coronavirus -Reports that her symptoms are improving  Bilateral leg pain -Says that this pain occurs mostly in her calf muscles -Says that her legs will move or "twitch" when she has this pain -Does not think that it is like a cramp -Occurs most commonly when she is lying down -No history of trauma -No skin changes  Diabetes mellitus type 2 - Medication: 15 units per meal of novolog, 60 units Lantus QHS -We will often only eat 2 meals and therefore take NovoLog only twice during her workdays - Postprandial blood sugars are usually around 230 - Has cut out bread and sweets - Has been needing to urinate and drink frequently - Reports that her urine is "frothy" - Denies hypoglycemia symptoms -Understands that her diabetes is not well controlled since her last A1c was 13  ROS: Review of Systems - Negative except as stated in HPI   Pertinent PMHx: Hypertension, uncontrolled type 2 diabetes, diabetic neuropathy, depression, back pain  Assessment/Plan:  Diabetic neuropathy, type II diabetes mellitus Leg pain sounds clinically like cramps, but neuropathy could also be playing a role.  Possible that she could also have restless leg syndrome since her legs will move during these episodes, and they often occur when she is lying down.  Told patient to stay hydrated in order to mitigate cramps, keep track of symptoms, and let me know if this problem worsens.  Type II diabetes mellitus with complication, uncontrolled (HCC) Discussed with patient that her type 2 diabetes is uncontrolled and is  likely causing her frequent thirst and urination.  She may also be losing protein through her urine, because he gets frothy appearance.  Her blood sugars in the mid 200s are too high and are likely higher than 200 frequently given her last A1c of 13.  Would like to increase her NovoLog to 20 units 3 times daily with 3 meals per day so that her short acting and long-acting insulin doses are about equal.  We will also start Victoza once daily, with her dose as of 0.6 mg/day the first week then 1.2 mg daily after that.  Patient was counseled on the effects of this medication and would like to start it now.  Courage patient to continue checking her blood sugars at least once per day and to let me know if she has any hypoglycemic symptoms with the increase in her NovoLog.  Acute upper respiratory infection Improved.  Although symptoms were initially concerning for novel coronavirus, patient has tested negative for this.  Work release note was provided to patient via Clinical cytogeneticist.    Time spent on phone with patient: 18 minutes

## 2018-04-05 NOTE — Assessment & Plan Note (Signed)
Improved.  Although symptoms were initially concerning for novel coronavirus, patient has tested negative for this.  Work release note was provided to patient via Clinical cytogeneticist.

## 2018-04-05 NOTE — Assessment & Plan Note (Signed)
Discussed with patient that her type 2 diabetes is uncontrolled and is likely causing her frequent thirst and urination.  She may also be losing protein through her urine, because he gets frothy appearance.  Her blood sugars in the mid 200s are too high and are likely higher than 200 frequently given her last A1c of 13.  Would like to increase her NovoLog to 20 units 3 times daily with 3 meals per day so that her short acting and long-acting insulin doses are about equal.  We will also start Victoza once daily, with her dose as of 0.6 mg/day the first week then 1.2 mg daily after that.  Patient was counseled on the effects of this medication and would like to start it now.  Courage patient to continue checking her blood sugars at least once per day and to let me know if she has any hypoglycemic symptoms with the increase in her NovoLog.

## 2018-04-18 ENCOUNTER — Ambulatory Visit: Payer: Medicaid Other | Admitting: Internal Medicine

## 2018-04-25 ENCOUNTER — Encounter: Payer: Self-pay | Admitting: Family Medicine

## 2018-04-25 ENCOUNTER — Telehealth: Payer: Self-pay | Admitting: *Deleted

## 2018-04-25 NOTE — Telephone Encounter (Signed)
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  She lives in the house with her 35 year old and elderly mother (hx of DM and Breast cancer) and wants to know if she can have a letter to allow her to work from home.  Will forward to MD. Jone Baseman, CMA

## 2018-04-25 NOTE — Telephone Encounter (Signed)
I have written the letter for Ms. Hoiland and it should be under the Letters tab in her chart.  Since I am not in clinic today, would you print it out, place it up front for her to pick up, and call her to let her know it is ready?  Thank you!

## 2018-04-26 ENCOUNTER — Encounter: Payer: Self-pay | Admitting: *Deleted

## 2018-04-26 NOTE — Telephone Encounter (Signed)
Attempted to call pt, no answer and mailbox was full. Will try to inform pt through mychart. Deseree Bruna Potter, CMA

## 2018-04-26 NOTE — Telephone Encounter (Signed)
Pt returning call. Informed of note being ready. Pt will print off in mychart.

## 2018-05-07 ENCOUNTER — Telehealth: Payer: Medicaid Other | Admitting: Physician Assistant

## 2018-05-07 DIAGNOSIS — R059 Cough, unspecified: Secondary | ICD-10-CM

## 2018-05-07 DIAGNOSIS — H539 Unspecified visual disturbance: Secondary | ICD-10-CM

## 2018-05-07 DIAGNOSIS — R05 Cough: Secondary | ICD-10-CM

## 2018-05-07 DIAGNOSIS — J029 Acute pharyngitis, unspecified: Secondary | ICD-10-CM

## 2018-05-07 NOTE — Progress Notes (Signed)
Based on what you shared with me, I feel your condition warrants further evaluation and I recommend that you be seen for a face to face office visit. My biggest concern is your change in vision.Given you symptoms are not consistent with COVID 19 I am less concerned about that possibility.Please go to one of the urgent cares today to discuss your symptoms in person.     NOTE: If you entered your credit card information for this eVisit, you will not be charged. You may see a "hold" on your card for the $35 but that hold will drop off and you will not have a charge processed.  If you are having a true medical emergency please call 911.If you need an urgent face to face visit, [place] has four urgent care centers for your convenience.  PLEASE NOTE: THE INSTACARE LOCATIONS AND URGENT CARE CLINICS DO NOT HAVE THE TESTING FOR CORONAVIRUS COVID19 AVAILABLE.IF YOU FEEL YOU NEED THIS TEST YOU MUST GO TO A TRIAGE LOCATION AT ONE OF THE HOSPITAL EMERGENCY DEPARTMENTS ?  [email or web address] to reserve your spot online an avoid wait times  [facility] [address] [place], [place] [address] Modified hours of operation: Monday-Friday, 10 AM to 6 PM  Saturday & Sunday 10 AM to 4 PM *Across the street from [employer]  [facility] (New Address!) [address] [place], [place] [address] *Just off [address] across the road from [place]* Modified hours of operation: Monday-Friday, 10 AM to 5 PM  Closed Saturday & Sunday   The following sites will take your insurance:  [facility]  [phone] Get Driving Directions Find a Provider at this Location  [address] [place], [place] [address] 10 am to 8 pm Monday-Friday 12 pm to 8 pm [facility] at [facility]  [phone] Get Driving Directions Find a Provider at this Location  [address] [place] [address],  [address] [place], [place] [address] 8 am to 8 pm Monday-Friday 9 am to 6 pm Saturday 11 am to 6 pm Sunday   [facility] at [facility]  [phone] Get Driving Directions  [address] [place], [place] [address] 8 am to 8 pm Monday-Friday 8 am to 4 pm Saturday-Sunday   Your e-visit answers were reviewed by a board certified advanced clinical practitioner to complete your personal care plan.Thank you for using e-Visits.   ----- Message -----  From: Karina [name]  Sent: 4/26/20201:05 PM EDT  To: E-Visit Mailing List Subject: E-Visit Submission: CoronaVirus (COVID-19) Screening  E-Visit Submission: CoronaVirus (COVID-19) Screening --------------------------------  Question: Do you have any of the following?  Answer: Cough  Question: Do you have any of the following additional symptoms?  Answer: Sore throat  Blurred vision  Question: Have you had a fever? Answer: No  Question: Have others in your home or workplace had similar symptoms? Answer: Yes  Question: When did your symptoms start? Answer: 05/04/2018  Question: Have you recently visited any of the following countries? Answer: None of these  Question: If you have traveled anywhere in the last2 months please document where you have visited: Answer: No  Question: Have you recently been around others from these countries or visited these countries who have had coughing or fever? Answer: No  Question: Have you recently been around anyone who has been diagnosed with Corona virus? Answer: No  Question: Have you been taking any medications? Answer: Yes  Question:  If taking medications for these symptoms, please list the names and whether they are helping or not Answer: Children's Mucinex  Question: Are you treated for any of the following conditions: Asthma, COPD, Diabetes, Renal Failure (on Dialysis), AIDS, any Neuromuscular disease that  effects the clearing of secretions, Heart Failure, or Heart Disease? Answer: Yes  Question: Please enter a phone number where you can be reached if we have additional questions about your symptoms Answer: [phone]  Question: Please list your medication allergies that you may have ? (If 'none' , please list as 'none') Answer: None  Question: Please list any additional comments  Answer: When a breathe deeply, I hear a wheezing sound  Question: Are you pregnant? Answer: I am confident that I am not pregnant  Question: Are you breastfeeding? Answer: No

## 2018-05-15 ENCOUNTER — Other Ambulatory Visit: Payer: Self-pay

## 2018-05-15 ENCOUNTER — Ambulatory Visit (INDEPENDENT_AMBULATORY_CARE_PROVIDER_SITE_OTHER): Payer: BLUE CROSS/BLUE SHIELD

## 2018-05-15 ENCOUNTER — Ambulatory Visit
Admission: EM | Admit: 2018-05-15 | Discharge: 2018-05-15 | Disposition: A | Discharge status: Home / Self Care | Payer: BLUE CROSS/BLUE SHIELD | Attending: Physician Assistant | Admitting: Physician Assistant

## 2018-05-15 DIAGNOSIS — J209 Acute bronchitis, unspecified: Secondary | ICD-10-CM

## 2018-05-15 DIAGNOSIS — R05 Cough: Secondary | ICD-10-CM | POA: Diagnosis not present

## 2018-05-15 MED ORDER — ALBUTEROL SULFATE HFA 108 (90 BASE) MCG/ACT IN AERS: 1.0000 | INHALATION_SPRAY | Freq: Four times a day (QID) | RESPIRATORY_TRACT | 0 refills | Status: DC | PRN

## 2018-05-15 MED ORDER — BENZONATATE 100 MG PO CAPS: 100.0000 mg | ORAL_CAPSULE | Freq: Three times a day (TID) | ORAL | 0 refills | Status: DC

## 2018-05-15 NOTE — ED Provider Notes (Addendum)
[facility]    CSN: [id number] Arrival date & time: 05/15/18  1041     History   Chief Complaint Chief Complaint  Patient presents with  . Cough    HPI Karina [name] is a 33 y.o. female.   The history is provided by the patient. No language interpreter was used.  Cough  Cough characteristics:  Productive Sputum characteristics:  Nondescript Severity:  Moderate Onset quality:  Gradual Duration:  1 week Timing:  Constant Progression:  Worsening Chronicity:  New Smoker: no   Context: upper respiratory infection   Relieved by:  Nothing Worsened by:  Nothing Associated symptoms: shortness of breath   Pt complains of a cough and congestion.  Pt reports she has cough so hard she loses urine control   Past Medical History:  Diagnosis Date  . Diabetes mellitus    Type 2, insulin resistant    Patient Active Problem List   Diagnosis Date Noted  . Acute upper respiratory infection 04/05/2018  . Back pain 12/12/2017  . Lower extremity edema 08/29/2017  . Encounter for vitamin deficiency screening 08/16/2016  . 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 Boil 06/06/2015  . Syncope 04/08/2015  . Irregular menstrual cycle 07/04/2014  . Essential hypertension, benign 12/12/2013  . Annual physical exam 06/27/2013  . Morbid obesity ([place]) 05/25/2013  . Moderate nonproliferative diabetic retinopathy ([place]) 05/24/2013  . Diabetic neuropathy, type II diabetes mellitus ([place]) 05/23/2013  . Toenail fungus 01/17/2013  . Type II diabetes mellitus with complication, uncontrolled ([place]) 12/27/2012  . Depression 12/27/2012    Past Surgical History:  Procedure Laterality Date  . CERVICAL BIOPSY  2004  . CESAREAN SECTION N/A 06/02/2012   Procedure: CESAREAN SECTION;  Surgeon: Mora Bellman, MD;  Location: [facility];  Service: Obstetrics;  Laterality: N/A;    OB History    Gravida  1   Para  1   Term  1   Preterm      AB      Living  1     SAB      TAB      Ectopic      Multiple      Live Births  1            Home Medications    Prior to Admission medications   Medication Sig Start Date End Date Taking? Authorizing Provider  ACCU-CHEK FASTCLIX LANCETS MISC Check sugar 5 x daily 12/21/16   Carlyle Dolly, MD  Cholecalciferol 50000 units capsule Take 1 capsule (50,000 Units total) by mouth every 7 (seven) days. Patient not taking: Reported on 03/27/2018 09/03/16   Carlyle Dolly, MD  gabapentin (NEURONTIN) 100 MG capsule Take 1 capsule (100 mg total) by mouth at bedtime. Patient not taking: Reported on 03/27/2018 12/02/17   Caroline More, DO  glucose blood (ACCU-CHEK SMARTVIEW) test strip Check sugar 5 x daily 12/21/16   Carlyle Dolly, MD  insulin aspart (NOVOLOG FLEXPEN) 100 UNIT/ML FlexPen INJECT 20 units with each meal. 04/04/18   Winfrey, Alcario Drought, MD  Insulin Glargine (LANTUS SOLOSTAR) 100 UNIT/ML Solostar Pen Inject 35 Units into the skin 2 (two) times daily. 12/02/17   Caroline More, DO  Insulin Pen Needle (B-D ULTRAFINE III SHORT PEN) 31G X 8 MM MISC 1 Container by Does not apply route as needed. 05/16/17   Rogue Bussing, MD  Insulin Syringe-Needle U-100 Flossie Buffy INSULIN SYRINGE) 31G X 5/16" 1 ML MISC Use to inject insulin. 08/16/16   Smitty Cords  M, MD  Lancets Misc. (ACCU-CHEK SOFTCLIX LANCET DEV) KIT 1 application by Does not apply route 2 (two) times daily. Patient not taking: Reported on 03/27/2018 12/15/16   Carlyle Dolly, MD  liraglutide (VICTOZA) 18 MG/3ML SOPN Inject 0.2 mLs (1.2 mg total) into the skin daily. Inject 0.1 mLs (0.6 mg total) into the skin daily for the first week. 04/04/18   Kathrene Alu, MD  losartan (COZAAR) 25 MG tablet Take 1 tablet (25 mg total) by mouth daily. Patient not taking: Reported on 03/27/2018 12/26/17   Raylene Everts, MD    Family History Family History  Problem Relation Age of Onset  . Diabetes Father     Social History Social History   Tobacco Use   . Smoking status: Never Smoker  . Smokeless tobacco: Never Used  Substance Use Topics  . Alcohol use: No  . Drug use: No     Allergies   Lisinopril   Review of Systems Review of Systems  Respiratory: Positive for cough and shortness of breath.   All other systems reviewed and are negative.    Physical Exam Triage Vital Signs ED Triage Vitals [05/15/18 1054]  Enc Vitals Group     BP (!) 142/77     Pulse Rate 96     Resp 18     Temp 98.5 F (36.9 C)     Temp Source Oral     SpO2 96 %     Weight      Height      Head Circumference      Peak Flow      Pain Score 7     Pain Loc      Pain Edu?      Excl. in [place]?    No data found.  Updated Vital Signs BP (!) 142/77 (BP Location: Left Arm)   Pulse 96   Temp 98.5 F (36.9 C) (Oral)   Resp 18   LMP 04/16/2018   SpO2 96%   Visual Acuity Right Eye Distance:   Left Eye Distance:   Bilateral Distance:    Right Eye Near:   Left Eye Near:    Bilateral Near:     Physical Exam Vitals signs and nursing note reviewed.  Constitutional:      Appearance: She is well-developed.  HENT:     Head: Normocephalic.     Right Ear: Tympanic membrane normal.     Nose: Nose normal.  Eyes:     Pupils: Pupils are equal, round, and reactive to light.  Neck:     Musculoskeletal: Normal range of motion.  Cardiovascular:     Rate and Rhythm: Normal rate and regular rhythm.  Pulmonary:     Effort: Pulmonary effort is normal.  Abdominal:     General: Abdomen is flat. There is no distension.  Musculoskeletal: Normal range of motion.  Skin:    General: Skin is warm.  Neurological:     General: No focal deficit present.     Mental Status: She is alert and oriented to person, place, and time.  Psychiatric:        Mood and Affect: Mood normal.      UC Treatments / Results  Labs (all labs ordered are listed, but only abnormal results are displayed) Labs Reviewed - No data to display  EKG None  Radiology Dg Chest 2  View  Result Date: 05/15/2018 CLINICAL DATA:  Productive cough EXAM: CHEST - 2 VIEW COMPARISON:  None.  FINDINGS: The heart size and mediastinal contours are within normal limits. Both lungs are clear. The visualized skeletal structures are unremarkable. IMPRESSION: Negative chest. Electronically Signed   By: Monte Fantasia M.D.   On: 05/15/2018 11:20    Procedures Procedures (including critical care time)  Medications Ordered in UC Medications - No data to display  Initial Impression / Assessment and Plan / UC Course  I have reviewed the triage vital signs and the nursing notes.  Pertinent labs & imaging results that were available during my care of the patient were reviewed by me and considered in my medical decision making (see chart for details).     MDM  Chest xray normal.   Pt has normal 02 sats Pt counseled on possible covid  Final Clinical Impressions(s) / UC Diagnoses   Final diagnoses:  Acute bronchitis, unspecified organism     Discharge Instructions     Return if any problems     ED Prescriptions    Medication Sig Dispense Auth. Provider   benzonatate (TESSALON) 100 MG capsule Take 1 capsule (100 mg total) by mouth every 8 (eight) hours. 21 capsule Sofia, Leslie K, [place]   albuterol (VENTOLIN HFA) 108 (90 Base) MCG/ACT inhaler Inhale 1-2 puffs into the lungs every 6 (six) hours as needed for wheezing or shortness of breath. [place], Leslie K, [place]     Controlled Substance Prescriptions [place] Controlled Substance Registry consulted? Not Applicable  An After Visit Summary was printed and given to the patient. Fransico Meadow, PA-C 05/15/18 [place], [place] 05/15/18 1139

## 2018-05-15 NOTE — Discharge Instructions (Signed)
Return if any problems.

## 2018-05-15 NOTE — ED Triage Notes (Signed)
Pt c/o productive cough, chest pain, SOB after coughing x1wk; denies fever

## 2018-05-16 ENCOUNTER — Encounter: Payer: Self-pay | Admitting: Family Medicine

## 2018-05-16 ENCOUNTER — Telehealth (INDEPENDENT_AMBULATORY_CARE_PROVIDER_SITE_OTHER): Payer: BLUE CROSS/BLUE SHIELD | Admitting: Family Medicine

## 2018-05-16 DIAGNOSIS — R05 Cough: Secondary | ICD-10-CM | POA: Diagnosis not present

## 2018-05-16 DIAGNOSIS — E1165 Type 2 diabetes mellitus with hyperglycemia: Secondary | ICD-10-CM

## 2018-05-16 DIAGNOSIS — R058 Other specified cough: Secondary | ICD-10-CM

## 2018-05-16 DIAGNOSIS — E118 Type 2 diabetes mellitus with unspecified complications: Secondary | ICD-10-CM

## 2018-05-16 DIAGNOSIS — IMO0002 Reserved for concepts with insufficient information to code with codable children: Secondary | ICD-10-CM

## 2018-05-16 DIAGNOSIS — R059 Cough, unspecified: Secondary | ICD-10-CM

## 2018-05-16 MED ORDER — AZITHROMYCIN 250 MG PO TABS: ORAL_TABLET | ORAL | 0 refills | Status: DC

## 2018-05-16 NOTE — Assessment & Plan Note (Addendum)
Do not suspect DKA at this time given no abdominal pain,nausea/vomiting however low threshold as she has noticed very elevated blood sugars in 500+ range and current upper respiratory infection may trigger an episode.  Encouraged to check CBGs.  Strict ED precautions given.

## 2018-05-16 NOTE — Assessment & Plan Note (Signed)
Given duration of upper respiratory symptoms, given course of azithromycin to treat as bacterial URI.  Advised to continue tessalon pearles and use albuterol inhaler.  Discussed possibility that her symptoms may be related to COVID-19 as several members of household also with cough and chills.  Advised her and family to self quarantine.  May try Ibuprofen for body aches, advised plenty of warm fluids and supportive care.

## 2018-05-16 NOTE — Progress Notes (Signed)
[facility] Telemedicine Visit  Patient consented to have virtual visit.  Method of visit: Video  Encounter participants: Patient: Karina [name] - located at home Provider: Freddrick March - located at clinic Others (if applicable): N/A  Chief Complaint: cough, SOB   HPI:  Symptoms present x 1 week, going on 2 weeks now, and gradually worsening over the course of this period. She reports a productive cough, no blood in sputum. Chest hurts badly after she coughs.  No fever however has been having chills and body aches.  No known sick contacts or contact with COVID-19 positive individuals however her daughter and mom have also been coughing.  No weight loss or night sweats.  No h/o tobacco use.  Has noticed higher blood sugars in the 500 range and reports occasional blurry vision.  No symptoms of abdominal pain, nausea or vomiting.   Visited an urgent care at [facility] yesterday where CXR was normal with O2 sats within normal range.  She was counseled on possibility of COVID-19 and given precautions.  Prescribed tessalon pearles and albuterol.  She feels her symptoms have worsened and is following up today with our clinic.    ROS: per HPI  Pertinent PMHx: HTN, T2DM, morbid obesity, depression  Exam:  General: 33 yo female, NAD  Respiratory: no respiratory distress, speaking in full sentences  Psych: normal mood and affect   Assessment/Plan:  Cough with sputum Given duration of upper respiratory symptoms, given course of azithromycin to treat as bacterial URI.  Advised to continue tessalon pearles and use albuterol inhaler.  Discussed possibility that her symptoms may be related to COVID-19 as several members of household also with cough and chills.  Advised her and family to self quarantine.  May try Ibuprofen for body aches, advised plenty of warm fluids and supportive care.    Type II diabetes mellitus with complication, uncontrolled (HCC) Do not suspect DKA at  this time given no abdominal pain,nausea/vomiting however low threshold as she has noticed very elevated blood sugars in 500+ range and current upper respiratory infection may trigger an episode.  Encouraged to check CBGs.  Strict ED precautions given.   Time spent during visit with patient: 20 minutes  Freddrick March MD

## 2018-05-17 ENCOUNTER — Other Ambulatory Visit: Payer: Self-pay | Admitting: Family Medicine

## 2018-05-17 DIAGNOSIS — E118 Type 2 diabetes mellitus with unspecified complications: Principal | ICD-10-CM

## 2018-05-17 DIAGNOSIS — Z794 Long term (current) use of insulin: Principal | ICD-10-CM

## 2018-05-17 DIAGNOSIS — E1165 Type 2 diabetes mellitus with hyperglycemia: Secondary | ICD-10-CM

## 2018-05-17 DIAGNOSIS — IMO0002 Reserved for concepts with insufficient information to code with codable children: Secondary | ICD-10-CM

## 2018-05-22 ENCOUNTER — Encounter: Payer: Self-pay | Admitting: Physician Assistant

## 2018-05-22 ENCOUNTER — Other Ambulatory Visit: Payer: Self-pay

## 2018-05-22 ENCOUNTER — Telehealth: Payer: BLUE CROSS/BLUE SHIELD | Admitting: Physician Assistant

## 2018-05-22 ENCOUNTER — Encounter (HOSPITAL_COMMUNITY): Payer: Self-pay

## 2018-05-22 ENCOUNTER — Emergency Department (HOSPITAL_COMMUNITY): Payer: BLUE CROSS/BLUE SHIELD

## 2018-05-22 ENCOUNTER — Emergency Department (HOSPITAL_COMMUNITY)
Admission: EM | Admit: 2018-05-22 | Discharge: 2018-05-23 | Disposition: A | Discharge status: Home / Self Care | Payer: BLUE CROSS/BLUE SHIELD | Attending: Emergency Medicine | Admitting: Emergency Medicine

## 2018-05-22 DIAGNOSIS — E119 Type 2 diabetes mellitus without complications: Secondary | ICD-10-CM | POA: Insufficient documentation

## 2018-05-22 DIAGNOSIS — Z20828 Contact with and (suspected) exposure to other viral communicable diseases: Secondary | ICD-10-CM | POA: Diagnosis not present

## 2018-05-22 DIAGNOSIS — Z794 Long term (current) use of insulin: Secondary | ICD-10-CM | POA: Diagnosis not present

## 2018-05-22 DIAGNOSIS — Z03818 Encounter for observation for suspected exposure to other biological agents ruled out: Secondary | ICD-10-CM | POA: Insufficient documentation

## 2018-05-22 DIAGNOSIS — R059 Cough, unspecified: Secondary | ICD-10-CM

## 2018-05-22 DIAGNOSIS — I10 Essential (primary) hypertension: Secondary | ICD-10-CM | POA: Insufficient documentation

## 2018-05-22 DIAGNOSIS — R0602 Shortness of breath: Secondary | ICD-10-CM | POA: Diagnosis not present

## 2018-05-22 DIAGNOSIS — R072 Precordial pain: Secondary | ICD-10-CM | POA: Diagnosis not present

## 2018-05-22 DIAGNOSIS — R079 Chest pain, unspecified: Secondary | ICD-10-CM

## 2018-05-22 DIAGNOSIS — R05 Cough: Secondary | ICD-10-CM | POA: Insufficient documentation

## 2018-05-22 LAB — BASIC METABOLIC PANEL
Anion gap: 12 (ref 5–15)
BUN: 12 mg/dL (ref 6–20)
CO2: 26 mmol/L (ref 22–32)
Calcium: 9.4 mg/dL (ref 8.9–10.3)
Chloride: 92 mmol/L — ABNORMAL LOW (ref 98–111)
Creatinine, Ser: 1.11 mg/dL — ABNORMAL HIGH (ref 0.44–1.00)
GFR calc Af Amer: >60 mL/min (ref >60)
GFR calc non Af Amer: >60 mL/min (ref >60)
Glucose, Bld: 533 mg/dL (ref 70–99)
Potassium: 3.7 mmol/L (ref 3.5–5.1)
Sodium: 130 mmol/L — ABNORMAL LOW (ref 135–145)

## 2018-05-22 LAB — CBC
HCT: 34.5 % — ABNORMAL LOW (ref 36.0–46.0)
Hemoglobin: 11.6 g/dL — ABNORMAL LOW (ref 12.0–15.0)
MCH: 26.9 pg (ref 26.0–34.0)
MCHC: 33.6 g/dL (ref 30.0–36.0)
MCV: 80 fL (ref 80.0–100.0)
Platelets: 452 10*3/uL — ABNORMAL HIGH (ref 150–400)
RBC: 4.31 MIL/uL (ref 3.87–5.11)
RDW: 12.2 % (ref 11.5–15.5)
WBC: 5.6 10*3/uL (ref 4.0–10.5)
nRBC: 0 % (ref 0.0–0.2)

## 2018-05-22 LAB — I-STAT BETA HCG BLOOD, ED (MC, WL, AP ONLY): I-stat hCG, quantitative: <5 m[IU]/mL (ref <5)

## 2018-05-22 LAB — CBG MONITORING, ED: Glucose-Capillary: 498 mg/dL — ABNORMAL HIGH (ref 70–99)

## 2018-05-22 LAB — TROPONIN I: Troponin I: <0.03 ng/mL (ref <0.03)

## 2018-05-22 MED ORDER — INSULIN ASPART 100 UNIT/ML ~~LOC~~ SOLN
10.0000 [IU] | Freq: Once | SUBCUTANEOUS | Status: AC
  Administered 2018-05-23: 10 [IU] via SUBCUTANEOUS

## 2018-05-22 MED ORDER — SODIUM CHLORIDE 0.9% FLUSH: 3.0000 mL | Freq: Once | INTRAVENOUS | Status: DC

## 2018-05-22 NOTE — ED Provider Notes (Signed)
[facility] EMERGENCY DEPARTMENT Provider Note   CSN: [id number] Arrival date & time: 05/22/18  2055    History   Chief Complaint Chief Complaint  Patient presents with  . Chest Pain  . Shortness of Breath    HPI Karina [name] is a 33 y.o. female.     The history is provided by the patient.  Cough  Severity:  Moderate Onset quality:  Gradual Duration:  3 weeks Timing:  Intermittent Progression:  Worsening Chronicity:  Recurrent Smoker: no   Relieved by:  Nothing Worsened by:  Nothing Associated symptoms: chest pain and shortness of breath   Associated symptoms: no fever   Patient with history of diabetes presents with cough for up to 3 weeks.  She reports she has had tele-visits for this and tried on multiple medications including antibiotics but has not improved.  Denies hemoptysis.  She reports chest burning for cough.  She also reports shortness of breath.  No fever. SHe is a non-smoker She works from home No h/o CAD/PE Past Medical History:  Diagnosis Date  . Diabetes mellitus    Type 2, insulin resistant    Patient Active Problem List   Diagnosis Date Noted  . Cough with sputum 05/16/2018  . Acute upper respiratory infection 04/05/2018  . Back pain 12/12/2017  . Lower extremity edema 08/29/2017  . Encounter for vitamin deficiency screening 08/16/2016  . 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 Boil 06/06/2015  . Syncope 04/08/2015  . Irregular menstrual cycle 07/04/2014  . Essential hypertension, benign 12/12/2013  . Annual physical exam 06/27/2013  . Morbid obesity ([place]) 05/25/2013  . Moderate nonproliferative diabetic retinopathy ([place]) 05/24/2013  . Diabetic neuropathy, type II diabetes mellitus ([place]) 05/23/2013  . Toenail fungus 01/17/2013  . Type II diabetes mellitus with complication, uncontrolled ([place]) 12/27/2012  . Depression 12/27/2012    Past Surgical History:  Procedure Laterality Date  . CERVICAL BIOPSY  2004  . CESAREAN SECTION  N/A 06/02/2012   Procedure: CESAREAN SECTION;  Surgeon: Mora Bellman, MD;  Location: [facility];  Service: Obstetrics;  Laterality: N/A;     OB History    Gravida  1   Para  1   Term  1   Preterm      AB      Living  1     SAB      TAB      Ectopic      Multiple      Live Births  1            Home Medications    Prior to Admission medications   Medication Sig Start Date End Date Taking? Authorizing Provider  ACCU-CHEK FASTCLIX LANCETS MISC Check sugar 5 x daily 12/21/16   Carlyle Dolly, MD  albuterol (VENTOLIN HFA) 108 (90 Base) MCG/ACT inhaler Inhale 1-2 puffs into the lungs every 6 (six) hours as needed for wheezing or shortness of breath. 05/15/18   Fransico Meadow, PA-C  Cholecalciferol 50000 units capsule Take 1 capsule (50,000 Units total) by mouth every 7 (seven) days. Patient not taking: Reported on 03/27/2018 09/03/16   Carlyle Dolly, MD  gabapentin (NEURONTIN) 100 MG capsule Take 1 capsule (100 mg total) by mouth at bedtime. Patient not taking: Reported on 03/27/2018 12/02/17   Caroline More, DO  glucose blood (ACCU-CHEK SMARTVIEW) test strip Check sugar 5 x daily 12/21/16   Carlyle Dolly, MD  Insulin Aspart FlexPen 100 UNIT/ML SOPN INJECT 20 UNITS SUBCUTANEOUSLY WITH  BREAKFAST AND WITH LUNCH AND 15 UNITS WITH DINNER 05/18/18   Kathrene Alu, MD  Insulin Pen Needle (B-D ULTRAFINE III SHORT PEN) 31G X 8 MM MISC 1 Container by Does not apply route as needed. 05/16/17   Rogue Bussing, MD  Insulin Syringe-Needle U-100 Flossie Buffy INSULIN SYRINGE) 31G X 5/16" 1 ML MISC Use to inject insulin. 08/16/16   Carlyle Dolly, MD  Lancets Misc. (ACCU-CHEK SOFTCLIX LANCET DEV) KIT 1 application by Does not apply route 2 (two) times daily. Patient not taking: Reported on 03/27/2018 12/15/16   Carlyle Dolly, MD  LANTUS SOLOSTAR 100 UNIT/ML Solostar Pen INJECT 30 UNITS SUBCUTANEOUSLY TWICE DAILY 05/18/18   Kathrene Alu, MD  liraglutide  (VICTOZA) 18 MG/3ML SOPN Inject 0.2 mLs (1.2 mg total) into the skin daily. Inject 0.1 mLs (0.6 mg total) into the skin daily for the first week. 04/04/18   Kathrene Alu, MD  losartan (COZAAR) 25 MG tablet Take 1 tablet (25 mg total) by mouth daily. Patient not taking: Reported on 03/27/2018 12/26/17   Raylene Everts, MD    Family History Family History  Problem Relation Age of Onset  . Diabetes Father     Social History Social History   Tobacco Use  . Smoking status: Never Smoker  . Smokeless tobacco: Never Used  Substance Use Topics  . Alcohol use: No  . Drug use: No     Allergies   Lisinopril   Review of Systems Review of Systems  Constitutional: Negative for fever.  Respiratory: Positive for cough and shortness of breath.   Cardiovascular: Positive for chest pain.  Gastrointestinal: Negative for vomiting.  All other systems reviewed and are negative.    Physical Exam Updated Vital Signs BP 136/87 (BP Location: Right Arm)   Pulse 96   Temp 98.7 F (37.1 C) (Oral)   Resp 17   LMP 05/22/2018   SpO2 99%   Physical Exam  CONSTITUTIONAL: Well developed/well nourished HEAD: Normocephalic/atraumatic EYES: EOMI/PERRL ENMT: Mucous membranes moist, no stridor, no drooling, uvula midline NECK: supple no meningeal signs SPINE/BACK:entire spine nontender CV: S1/S2 noted, no murmurs/rubs/gallops noted LUNGS: Lungs are clear to auscultation bilaterally, mil tachypnea ABDOMEN: soft, nontender, no rebound or guarding, bowel sounds noted throughout abdomen GU:no cva tenderness NEURO: Pt is awake/alert/appropriate, moves all extremitiesx4.  No facial droop.   EXTREMITIES: pulses normal/equal, full ROM No LE tenderness SKIN: warm, color normal PSYCH: mildly anxious  ED Treatments / Results  Labs (all labs ordered are listed, but only abnormal results are displayed) Labs Reviewed  BASIC METABOLIC PANEL - Abnormal; Notable for the following components:       Result Value   Sodium 130 (*)    Chloride 92 (*)    Glucose, Bld 533 (*)    Creatinine, Ser 1.11 (*)    All other components within normal limits  CBC - Abnormal; Notable for the following components:   Hemoglobin 11.6 (*)    HCT 34.5 (*)    Platelets 452 (*)    All other components within normal limits  CBG MONITORING, ED - Abnormal; Notable for the following components:   Glucose-Capillary 498 (*)    All other components within normal limits  CBG MONITORING, ED - Abnormal; Notable for the following components:   Glucose-Capillary 204 (*)    All other components within normal limits  SARS CORONAVIRUS 2 (HOSPITAL ORDER, [facility])  TROPONIN I  D-DIMER, QUANTITATIVE (NOT AT [facility])  I-STAT BETA HCG BLOOD, ED ([facility], [facility], [facility] ONLY)    EKG EKG Interpretation  Date/Time:  Monday May 22 2018 23:01:33 EDT Ventricular Rate:  97 PR Interval:    QRS Duration: 78 QT Interval:  351 QTC Calculation: 446 R Axis:   14 Text Interpretation:  Sinus rhythm No significant change since last tracing Confirmed by Ripley Fraise [phone]) on 05/22/2018 11:07:19 PM   Radiology Dg Chest 2 View  Result Date: 05/22/2018 CLINICAL DATA:  Left chest pain and shortness of breath for 2-2.5 weeks. EXAM: CHEST - 2 VIEW COMPARISON:  PA and lateral chest 05/15/2018. FINDINGS: Lungs clear. Heart size normal. No pneumothorax or pleural fluid. No acute or focal bony abnormality. IMPRESSION: Negative chest. Electronically Signed   By: Inge Rise M.D.   On: 05/22/2018 21:44    Procedures Procedures   Medications Ordered in ED Medications  sodium chloride flush (NS) 0.9 % injection 3 mL (has no administration in time range)  insulin aspart (novoLOG) injection 10 Units (10 Units Subcutaneous Given 05/23/18 0046)     Initial Impression / Assessment and Plan / ED Course  I have reviewed the triage vital signs and the nursing notes.  Pertinent labs & imaging results that were  available during my care of the patient were reviewed by me and considered in my medical decision making (see chart for details).          11:47 PM Presents for cough for up to 3 weeks.  She reports the cough has triggered chest burning/shortness of breath.  No hemoptysis.  She tested negative for COVID-19 back in March due to exposure.  We will test her here at this time, and also check ddimer as PE is a possibility with her tachycardia Lung sounds are currently clear.  She reports recently finishing a round of antibiotics. She is also noted be hyperglycemic without anion gap 2:38 AM Patient improved.  No distress.  COVID-19 negative.  D-dimer negative.  Advise she can add on allergy medicines.  Follow-up PCP 1 to 2 weeks   Karina [name] was evaluated in Emergency Department on 05/23/2018 for the symptoms described in the history of present illness. She was evaluated in the context of the global COVID-19 pandemic, which necessitated consideration that the patient might be at risk for infection with the SARS-CoV-2 virus that causes COVID-19. Institutional protocols and algorithms that pertain to the evaluation of patients at risk for COVID-19 are in a state of rapid change based on information released by regulatory bodies including the CDC and federal and state organizations. These policies and algorithms were followed during the patient's care in the ED.  Final Clinical Impressions(s) / ED Diagnoses   Final diagnoses:  Cough  Precordial pain    ED Discharge Orders    None       Ripley Fraise, MD 05/23/18 [phone]

## 2018-05-22 NOTE — Progress Notes (Signed)
Based on what you shared with me, I feel your condition warrants further evaluation and I recommend that you be seen for a face to face office visit.  Karina [name],  Your symptoms warrant a face to face evaluation for proper workup of your chest pain and cough and to assess the need for Tb testing as you have requested.        NOTE: If you entered your credit card information for this eVisit, you will not be charged. You may see a "hold" on your card for the $35 but that hold will drop off and you will not have a charge processed.  If you are having a true medical emergency please call 911.  If you need an urgent face to face visit, [facility] has four urgent care centers for your convenience.    PLEASE NOTE: THE [facility] LOCATIONS AND URGENT CARE CLINICS DO NOT HAVE THE TESTING FOR CORONAVIRUS COVID19 AVAILABLE.  IF YOU FEEL YOU NEED THIS TEST YOU MUST GO TO A TRIAGE LOCATION AT ONE OF THE HOSPITAL EMERGENCY DEPARTMENTS   [email or web address] to reserve your spot online an avoid wait times  [facility] [address] [place], [place] [address] Modified hours of operation: Monday-Friday, 12 PM to 6 PM  Saturday & Sunday 10 AM to 4 PM *Across the street from [employer]  [employer] (New Address!) [address] [place], [place] [address] *Just off [employer], across the road from [place]* Modified hours of operation: Monday-Friday, 12 PM to 6 PM  Closed Saturday & Sunday  [facility]'s modified hours of operation will be in effect from May 1 until May 31   The following sites will take your insurance:  . [facility]  [phone] Get Driving Directions Find a Provider at this Location  [address] [place], [place] [address] . 10 am to 8 pm Monday-Friday . 12 pm to 8 pm Saturday-Sunday   . [facility]  [phone] Get Driving Directions Find a  Provider at this Location  [address] [place], [place] [address] . 8 am to 8 pm Monday-Friday . 9 am to 6 pm Saturday . 11 am to 6 pm Sunday   . [facility] at [facility]  [phone] Get Driving Directions  [address] [place], [place] [address] . 8 am to 8 pm Monday-Friday . 8 am to 4 pm Saturday-Sunday   Your e-visit answers were reviewed by a board certified advanced clinical practitioner to complete your personal care plan.  Thank you for using e-Visits.  I have spent 7 min in completion and review of this note- [facility]

## 2018-05-22 NOTE — ED Triage Notes (Addendum)
Pt c.o sob and chest pain for the past 2 weeks, but has gotten worse today. Pt not able to talk in complete sentences. Hx of diabetes, cbg running  "hi" last week, no hx of dka. Recently treated for bronchitis and pneumonia. Pt a.o at this time

## 2018-05-23 ENCOUNTER — Encounter: Payer: Self-pay | Admitting: Family Medicine

## 2018-05-23 ENCOUNTER — Telehealth (INDEPENDENT_AMBULATORY_CARE_PROVIDER_SITE_OTHER): Payer: BLUE CROSS/BLUE SHIELD | Admitting: Family Medicine

## 2018-05-23 DIAGNOSIS — R05 Cough: Secondary | ICD-10-CM | POA: Diagnosis not present

## 2018-05-23 DIAGNOSIS — R059 Cough, unspecified: Secondary | ICD-10-CM

## 2018-05-23 DIAGNOSIS — R079 Chest pain, unspecified: Secondary | ICD-10-CM

## 2018-05-23 LAB — D-DIMER, QUANTITATIVE: D-Dimer, Quant: 0.4 ug/mL-FEU (ref 0.00–0.50)

## 2018-05-23 LAB — SARS CORONAVIRUS 2 BY RT PCR (HOSPITAL ORDER, PERFORMED IN ~~LOC~~ HOSPITAL LAB): SARS Coronavirus 2: NEGATIVE

## 2018-05-23 LAB — CBG MONITORING, ED: Glucose-Capillary: 204 mg/dL — ABNORMAL HIGH (ref 70–99)

## 2018-05-23 MED ORDER — GUAIFENESIN-CODEINE 100-10 MG/5ML PO SOLN: 5.0000 mL | Freq: Three times a day (TID) | ORAL | 0 refills | Status: DC | PRN

## 2018-05-23 NOTE — ED Notes (Signed)
PT states understanding of care given, follow up care, and medication prescribed. PT ambulated from ED to car with a steady gait. 

## 2018-05-23 NOTE — Discharge Instructions (Addendum)

## 2018-05-24 ENCOUNTER — Encounter: Payer: Self-pay | Admitting: Family Medicine

## 2018-05-24 ENCOUNTER — Telehealth: Payer: Self-pay | Admitting: Family Medicine

## 2018-05-24 NOTE — Telephone Encounter (Signed)
It appears I did this incorrectly during the telephone encounter I had with the patient. Sent work excuse both via a communication and through my chart to patient.  Myrene Buddy MD PGY-2 Family Medicine Resident

## 2018-05-24 NOTE — Telephone Encounter (Signed)
Pt is calling to check on the status of Dr. Primitivo Gauze writing her a note to be excused from work next week 05/29/18. Pt would like for the letter to be sent to her on mychart.

## 2018-05-25 ENCOUNTER — Encounter: Payer: Self-pay | Admitting: Family Medicine

## 2018-05-26 DIAGNOSIS — R079 Chest pain, unspecified: Secondary | ICD-10-CM | POA: Insufficient documentation

## 2018-05-26 NOTE — Assessment & Plan Note (Signed)
Likely secondary to cough. Per patient report on the phone the pain is limited to chest wall. No radiating or any alarm symptoms.

## 2018-05-26 NOTE — Assessment & Plan Note (Addendum)
Likely post viral cough.  Given that she is having trouble making it through work and having the blood-streaked sputum likely secondary to cough, can take Robitussin-DM. Will also give short supply of guaifenasin/codeine to help assist with cough.

## 2018-05-26 NOTE — Progress Notes (Signed)
 [facility] Telemedicine Visit  Patient consented to have virtual visit. Method of visit: Telephone  Encounter participants: Patient: Karina [name] - located at home Provider: Myrene Buddy - located at [facility] Others (if applicable): none  Chief Complaint: dry cough  HPI: 33 year old female who presents telephone encounter for dry cough.  Patient states cough for started 3 weeks ago.  Originally was productive with yellow-green sputum.  Around this time she also developed sore throat, muscle aches, rhinorrhea.  She states that all of her other symptoms improved but the dry cough remains.  She states that it gets worse at night.  She states that she has had a few episode of blood-streaked sputum.  She is having some chest pain which she feels is entirely due to her coughing.  She is having no radiating and it is bilateral chest.  It is quite sore when she pushes on her chest.  She has tried a combination of NyQuil, DayQuil, and "cough syrup".  ROS: per HPI  Pertinent PMHx: N/A  Exam:  GEN: 33 year old African-American female, no distress Respiratory: No wheezing or distress appreciated by telephone call.  Intermittent hacking cough noted. Psych: pleasant, appropriate  Assessment/Plan:  Cough Likely post viral cough.  Given that she is having trouble making it through work and having the blood-streaked sputum likely secondary to cough, can take Robitussin-DM. Will also give short supply of guaifenasin/codeine to help assist with cough.  Chest pain Likely secondary to cough. Per patient report on the phone the pain is limited to chest wall. No radiating or any alarm symptoms.    Time spent during visit with patient: 14 minutes

## 2018-05-29 ENCOUNTER — Ambulatory Visit (INDEPENDENT_AMBULATORY_CARE_PROVIDER_SITE_OTHER): Payer: BLUE CROSS/BLUE SHIELD | Admitting: Family Medicine

## 2018-05-29 ENCOUNTER — Other Ambulatory Visit: Payer: Self-pay

## 2018-05-29 VITALS — BP 145/90 | HR 95 | Wt 223.8 lb

## 2018-05-29 DIAGNOSIS — E118 Type 2 diabetes mellitus with unspecified complications: Secondary | ICD-10-CM

## 2018-05-29 DIAGNOSIS — IMO0002 Reserved for concepts with insufficient information to code with codable children: Secondary | ICD-10-CM

## 2018-05-29 DIAGNOSIS — E1165 Type 2 diabetes mellitus with hyperglycemia: Secondary | ICD-10-CM

## 2018-05-29 DIAGNOSIS — R05 Cough: Secondary | ICD-10-CM

## 2018-05-29 DIAGNOSIS — R059 Cough, unspecified: Secondary | ICD-10-CM

## 2018-05-29 LAB — POCT GLYCOSYLATED HEMOGLOBIN (HGB A1C): HbA1c, POC (controlled diabetic range): 14.2 % — AB (ref 0.0–7.0)

## 2018-05-29 MED ORDER — INSULIN ISOPHANE & REGULAR (HUMAN 70-30)100 UNIT/ML KWIKPEN: PEN_INJECTOR | SUBCUTANEOUS | 1 refills | Status: DC

## 2018-05-29 MED ORDER — LOSARTAN POTASSIUM 25 MG PO TABS: 25.0000 mg | ORAL_TABLET | Freq: Every day | ORAL | 3 refills | Status: DC

## 2018-05-29 MED ORDER — FLUTICASONE PROPIONATE 50 MCG/ACT NA SUSP: 2.0000 | Freq: Every day | NASAL | 0 refills | Status: DC

## 2018-05-29 MED ORDER — GABAPENTIN 100 MG PO CAPS: 100.0000 mg | ORAL_CAPSULE | Freq: Every day | ORAL | 3 refills | Status: DC

## 2018-05-29 NOTE — Assessment & Plan Note (Signed)
Cough could be exacerbated by patient's allergies or possibly acid reflux.  Recommended that patient take Claritin once per day, start Pepcid once per day, and prescribed Flonase, which she can use once to twice per day if she finds it helpful.  Reassured her that although cough can often last for over a month, it will eventually go away on its own.  Also recommended that she drink warm tea with a small amount of honey to help soothe her throat.

## 2018-05-29 NOTE — Progress Notes (Signed)
Subjective:    Karina [name] - 33 y.o. female MRN [id number]  Date of birth: [birth year]  CC:  Karina [name] is here for diabetes follow up and cough.  HPI:  Cough Going on for over one month Some shortness of breath, which can happen even when sitting down Codeine seems to help a little with the cough Tested negative for COVID twice Has also had sore throat, sneezing, and some rhinorrhea, which is worse when she is outside Symptoms seem to come and go and can be unpredictable  Type 2 Diabetes Blood sugars: unreadable (high), mid-200s to 500s Hypoglycemic symptoms: none Medications: her insurance changed, so the price of Lantus and Novolog is no longer affordable for her, has been rationing insulin (Lantus 65 units once per day, Novolog 1-2 times per day), was unable to get victoza Diet and exercise: has tried intermittent fasting, has reintroduced meat and tried to limit bread, has gone outside with her daughter about 30 mins per day Is experiencing some burning pain in her feet   Health Maintenance:  Health Maintenance Due  Topic Date Due   PNEUMOCOCCAL POLYSACCHARIDE VACCINE AGE 37-64 HIGH RISK  07/31/1985   OPHTHALMOLOGY EXAM  04/15/2018   FOOT EXAM  05/17/2018    -  reports that she has never smoked. She has never used smokeless tobacco. - Review of Systems: Per HPI. - Past Medical History: Patient Active Problem List   Diagnosis Date Noted   Chest pain 05/26/2018   Cough 05/16/2018   Acute upper respiratory infection 04/05/2018   Back pain 12/12/2017   Lower extremity edema 08/29/2017   Encounter for vitamin deficiency screening 08/16/2016   Notalgia 09/11/2015   Boil 06/06/2015   Syncope 04/08/2015   Irregular menstrual cycle 07/04/2014   Essential hypertension, benign 12/12/2013   Annual physical exam 06/27/2013   Morbid obesity (HCC) 05/25/2013   Moderate nonproliferative diabetic retinopathy (HCC) 05/24/2013   Diabetic neuropathy,  type II diabetes mellitus (HCC) 05/23/2013   Toenail fungus 01/17/2013   Type II diabetes mellitus with complication, uncontrolled (HCC) 12/27/2012   Depression 12/27/2012   - Medications: reviewed and updated   Objective:   Physical Exam BP (!) 145/90 (BP Location: Right Arm, Patient Position: Sitting, Cuff Size: Large)    Pulse 95    Wt 223 lb 12.8 oz (101.5 kg)    LMP 05/22/2018    BMI 40.93 kg/m  Gen: NAD, alert, cooperative with exam, well-appearing, obese CV: RRR, good S1/S2, no murmur, no edema Resp: CTABL, no wheezes, non-labored Abd: SNTND, BS present, no guarding or organomegaly Skin: no rashes, normal turgor  Neuro: no gross deficits.  Psych: good insight, alert and oriented        Assessment & Plan:   Type II diabetes mellitus with complication, uncontrolled (HCC) Patient's hemoglobin A1c is 14.2 today.  Her A1c has been elevated for quite some time, so we had a very serious discussion about the consequences of having poorly controlled diabetes, especially in someone as young as she is.  I told her that if we do not get this under control, she is at high risk for losing her kidney function and being on dialysis, getting amputations, losing her eyesight, and having worsening neuropathy.  I told her that if she cannot afford insulin and is rationing it, she needs to call us so that we can think of another medication for her rather than continuing to give herself suboptimal insulin doses.  We will give her insulin 70/30  for now since Lantus is over $80 per month even with her insurance.  We will set her doses at 40 units in the morning and 20 units at night, although this will likely need to be adjusted.  We have set up a telephone encounter with Dr. Raymondo Band on Thursday, May 21, where further medication optimization can be made.  I stressed to her the importance of this appointment.  I tried to also prescribe a GLP-1 antagonist, but I am unsure what this cost will be with her  insurance, so I will hold off on this until patient is evaluated by Dr. Raymondo Band.  Patient was in agreement with these changes and seem to understand the importance of having stricture diabetes control.  I restarted her losartan since her blood pressure was 145/90 today and we will obtain a BMP.  Unfortunately, we did not have time to do a foot exam or give her a pneumonia vaccine.  Cough Cough could be exacerbated by patient's allergies or possibly acid reflux.  Recommended that patient take Claritin once per day, start Pepcid once per day, and prescribed Flonase, which she can use once to twice per day if she finds it helpful.  Reassured her that although cough can often last for over a month, it will eventually go away on its own.  Also recommended that she drink warm tea with a small amount of honey to help soothe her throat.    Lezlie Octave, M.D. 05/29/2018, 4:06 PM PGY-2, [facility]

## 2018-05-29 NOTE — Assessment & Plan Note (Signed)
Patient's hemoglobin A1c is 14.2 today.  Her A1c has been elevated for quite some time, so we had a very serious discussion about the consequences of having poorly controlled diabetes, especially in someone as young as she is.  I told her that if we do not get this under control, she is at high risk for losing her kidney function and being on dialysis, getting amputations, losing her eyesight, and having worsening neuropathy.  I told her that if she cannot afford insulin and is rationing it, she needs to call us so that we can think of another medication for her rather than continuing to give herself suboptimal insulin doses.  We will give her insulin 70/30 for now since Lantus is over $80 per month even with her insurance.  We will set her doses at 40 units in the morning and 20 units at night, although this will likely need to be adjusted.  We have set up a telephone encounter with Dr. Raymondo Band on Thursday, May 21, where further medication optimization can be made.  I stressed to her the importance of this appointment.  I tried to also prescribe a GLP-1 antagonist, but I am unsure what this cost will be with her insurance, so I will hold off on this until patient is evaluated by Dr. Raymondo Band.  Patient was in agreement with these changes and seem to understand the importance of having stricture diabetes control.  I restarted her losartan since her blood pressure was 145/90 today and we will obtain a BMP.  Unfortunately, we did not have time to do a foot exam or give her a pneumonia vaccine.

## 2018-05-29 NOTE — Patient Instructions (Addendum)
It was nice seeing you today [name]!  For your cough, please try to take Claritin once per day and also take Pepcid 20 mg once per day.  I have also sent in Flonase, which you can use once or twice per day.  Drinking hot tea with honey is also helpful.  This will improve with time.  We need to work very hard on your diabetes to get it better controlled.  I am sending in a type of insulin that should be more affordable, but I want you to see our pharmacist, Dr. Raymondo Band, as soon as possible so that he can make sure you are on the best medications that your insurance will cover.  You will need to take two thirds of this medication in the morning and one third of this medication in the evening, each time before a meal.  Please let us know if this new insulin is still too expensive, and we will send you another option.  I am also sending in a medication called losartan for your blood pressure, which you will need to take once per day.  If you have any questions or concerns, please feel free to call the clinic.   Be well,  Dr. Frances Furbish

## 2018-05-30 ENCOUNTER — Encounter: Payer: Self-pay | Admitting: Family Medicine

## 2018-05-30 ENCOUNTER — Encounter: Payer: Self-pay | Admitting: Internal Medicine

## 2018-05-30 ENCOUNTER — Telehealth: Payer: Self-pay

## 2018-05-30 ENCOUNTER — Ambulatory Visit (INDEPENDENT_AMBULATORY_CARE_PROVIDER_SITE_OTHER): Payer: BLUE CROSS/BLUE SHIELD | Admitting: Internal Medicine

## 2018-05-30 DIAGNOSIS — Z794 Long term (current) use of insulin: Secondary | ICD-10-CM

## 2018-05-30 DIAGNOSIS — E113313 Type 2 diabetes mellitus with moderate nonproliferative diabetic retinopathy with macular edema, bilateral: Secondary | ICD-10-CM | POA: Diagnosis not present

## 2018-05-30 LAB — BASIC METABOLIC PANEL
BUN/Creatinine Ratio: 11 (ref 9–23)
BUN: 10 mg/dL (ref 6–20)
CO2: 23 mmol/L (ref 20–29)
Calcium: 9 mg/dL (ref 8.7–10.2)
Chloride: 96 mmol/L (ref 96–106)
Creatinine, Ser: 0.91 mg/dL (ref 0.57–1.00)
GFR calc Af Amer: 95 mL/min/{1.73_m2} (ref >59)
GFR calc non Af Amer: 83 mL/min/{1.73_m2} (ref >59)
Glucose: 381 mg/dL — ABNORMAL HIGH (ref 65–99)
Potassium: 4.4 mmol/L (ref 3.5–5.2)
Sodium: 130 mmol/L — ABNORMAL LOW (ref 134–144)

## 2018-05-30 MED ORDER — SEMAGLUTIDE(0.25 OR 0.5MG/DOS) 2 MG/1.5ML ~~LOC~~ SOPN: 0.5000 mg | PEN_INJECTOR | SUBCUTANEOUS | 5 refills | Status: DC

## 2018-05-30 MED ORDER — INSULIN DEGLUDEC 200 UNIT/ML ~~LOC~~ SOPN: 60.0000 [IU] | PEN_INJECTOR | Freq: Every day | SUBCUTANEOUS | 5 refills | Status: DC

## 2018-05-30 MED ORDER — DULAGLUTIDE 0.75 MG/0.5ML ~~LOC~~ SOAJ: SUBCUTANEOUS | 1 refills | Status: DC

## 2018-05-30 MED ORDER — FREESTYLE LIBRE 14 DAY READER DEVI: 1.0000 | Freq: Once | 1 refills | Status: AC

## 2018-05-30 MED ORDER — FREESTYLE LIBRE 14 DAY SENSOR MISC: 1.0000 | 11 refills | Status: DC

## 2018-05-30 NOTE — Patient Instructions (Addendum)
Please change from Lantus to: - Tresiba U200 60 units daily  After you finish Novolog, start: - Relion regular insulin 30 min before meals: 15-20 units before each meal  Please start: - Ozempic 0.25 mg weekly in a.m. (for example on Sunday morning) x 4 weeks, then increase to 0.5 mg weekly in a.m. if no nausea or hypoglycemia.  STOP sweet drinks.  Please let me know if the sugars are consistently <80 or >200.  Please return in 3 months with your sugar log.   PATIENT INSTRUCTIONS FOR TYPE 2 DIABETES:  **Please join MyChart!** - see attached instructions about how to join if you have not done so already.  DIET AND EXERCISE Diet and exercise is an important part of diabetic treatment.  We recommended aerobic exercise in the form of brisk walking (working between 40-60% of maximal aerobic capacity, similar to brisk walking) for 150 minutes per week (such as 30 minutes five days per week) along with 3 times per week performing 'resistance' training (using various gauge rubber tubes with handles) 5-10 exercises involving the major muscle groups (upper body, lower body and core) performing 10-15 repetitions (or near fatigue) each exercise. Start at half the above goal but build slowly to reach the above goals. If limited by weight, joint pain, or disability, we recommend daily walking in a swimming pool with water up to waist to reduce pressure from joints while allow for adequate exercise.    BLOOD GLUCOSES Monitoring your blood glucoses is important for continued management of your diabetes. Please check your blood glucoses 2-4 times a day: fasting, before meals and at bedtime (you can rotate these measurements - e.g. one day check before the 3 meals, the next day check before 2 of the meals and before bedtime, etc.).   HYPOGLYCEMIA (low blood sugar) Hypoglycemia is usually a reaction to not eating, exercising, or taking too much insulin/ other diabetes drugs.  Symptoms include tremors,  sweating, hunger, confusion, headache, etc. Treat IMMEDIATELY with 15 grams of Carbs: . 4 glucose tablets .  cup regular juice/soda . 2 tablespoons raisins . 4 teaspoons sugar . 1 tablespoon honey Recheck blood glucose in 15 mins and repeat above if still symptomatic/blood glucose <100.  RECOMMENDATIONS TO REDUCE YOUR RISK OF DIABETIC COMPLICATIONS: * Take your prescribed MEDICATION(S) * Follow a DIABETIC diet: Complex carbs, fiber rich foods, (monounsaturated and polyunsaturated) fats * AVOID saturated/trans fats, high fat foods, >2,300 mg salt per day. * EXERCISE at least 5 times a week for 30 minutes or preferably daily.  * DO NOT SMOKE OR DRINK more than 1 drink a day. * Check your FEET every day. Do not wear tightfitting shoes. Contact us if you develop an ulcer * See your EYE doctor once a year or more if needed * Get a FLU shot once a year * Get a PNEUMONIA vaccine once before and once after age 165 years  GOALS:  * Your Hemoglobin A1c of <7%  * fasting sugars need to be <130 * after meals sugars need to be <180 (2h after you start eating) * Your Systolic BP should be 140 or lower  * Your Diastolic BP should be 80 or lower  * Your HDL (Good Cholesterol) should be 40 or higher  * Your LDL (Bad Cholesterol) should be 100 or lower. * Your Triglycerides should be 150 or lower  * Your Urine microalbumin (kidney function) should be <30 * Your Body Mass Index should be 25 or lower  Please consider the following ways to cut down carbs and fat and increase fiber and micronutrients in your diet: - substitute whole grain for white bread or pasta - substitute brown rice for white rice - substitute 90-calorie flat bread pieces for slices of bread when possible - substitute sweet potatoes or yams for white potatoes - substitute humus for margarine - substitute tofu for cheese when possible - substitute almond or rice milk for regular milk (would not drink soy milk daily due to  concern for soy estrogen influence on breast cancer risk) - substitute dark chocolate for other sweets when possible - substitute water - can add lemon or orange slices for taste - for diet sodas (artificial sweeteners will trick your body that you can eat sweets without getting calories and will lead you to overeating and weight gain in the long run) - do not skip breakfast or other meals (this will slow down the metabolism and will result in more weight gain over time)  - can try smoothies made from fruit and almond/rice milk in am instead of regular breakfast - can also try old-fashioned (not instant) oatmeal made with almond/rice milk in am - order the dressing on the side when eating salad at a restaurant (pour less than half of the dressing on the salad) - eat as little meat as possible - can try juicing, but should not forget that juicing will get rid of the fiber, so would alternate with eating raw veg./fruits or drinking smoothies - use as little oil as possible, even when using olive oil - can dress a salad with a mix of balsamic vinegar and lemon juice, for e.g. - use agave nectar, stevia sugar, or regular sugar rather than artificial sweateners - steam or broil/roast veggies  - snack on veggies/fruit/nuts (unsalted, preferably) when possible, rather than processed foods - reduce or eliminate aspartame in diet (it is in diet sodas, chewing gum, etc) Read the labels!  Try to read Dr. Katherina Right book: "Program for Reversing Diabetes" for other ideas for healthy eating.

## 2018-05-30 NOTE — Telephone Encounter (Signed)
Both Trulicity and Ozempic are not covered.  Please advise.

## 2018-05-30 NOTE — Progress Notes (Signed)
Patient ID: TAJANAE GUILBAULT, female   DOB: August 26, 1985, 33 y.o.   MRN: [id number]   Patient location: Home My location: Office  Referring Provider: Kathrene Alu, MD  I connected with the patient on 05/30/18 at  8:20 AM EDT by a video enabled telemedicine application and verified that I am speaking with the correct person.   I discussed the limitations of evaluation and management by telemedicine and the availability of in person appointments. The patient expressed understanding and agreed to proceed.   Details of the encounter are shown below.  HPI: DOMONIC HISCOX is a 33 y.o.-year-old female, referred by her PCP, Dr. Shan Levans, for management of DM2, dx in 2004, insulin-dependent since 2009, uncontrolled, with long-term complications (peripheral neuropathy, moderate retinopathy + macular edema).  She complains of increased thirst and urination and her HbA1c levels have been very high for a long time.  Reviewed latest HbA1c level: Lab Results  Component Value Date   HGBA1C 14.2 (A) 05/29/2018   HGBA1C 13.0 (A) 03/07/2018   HGBA1C 11.7 (A) 12/02/2017   HGBA1C 10.6 (A) 08/29/2017   HGBA1C 14.5 05/16/2017   HGBA1C 13.6 08/16/2016   HGBA1C 14.0 05/24/2016   HGBA1C 13.5 11/24/2015   HGBA1C 14.1 03/21/2015   HGBA1C 13.3 07/17/2014   Pt is on a regimen of: - Lantus 40 units 2x a day >> now 60 units at bedtime to ration it - Novolog 15-20 units 3x a day, before meals - misses 1-3 dose a day She tried Metformin IR and ER >> N/D, however, she tolerated Glucophage well. She was recommended to use 70/30 insulin at her appointment with PCP yesterday but she did not start this yet pending our appointment today.  Pt checks her sugars 1-2x a day and they are: - am: 197-592 - 2h after b'fast: n/c - before lunch: n/c - 2h after lunch: n/c - before dinner: n/c - 2h after dinner: n/c - bedtime: 300-HI - nighttime: n/c Lowest sugar was 197; she has hypoglycemia awareness 80-90.  Highest  sugar was HI.  Glucometer: AccuChek  Pt's meals are: - Breakfast: bagel or grits, veggie sausage - Lunch: sandwich with vegan deli meat, taco, home cooked meals - Dinner: fish + veggies +/- starch - Snacks: chips, sweet tea, juice No meat for 2 years, now reintroducing it. Recently decreased starches- started 3 days ago.  - no history of CKD, last BUN/creatinine:  Lab Results  Component Value Date   BUN 10 05/29/2018   BUN 12 05/22/2018   CREATININE 0.91 05/29/2018   CREATININE 1.11 (H) 05/22/2018  On Cozaar.  -+ HL; last set of lipids: Lab Results  Component Value Date   CHOL 201 (H) 03/21/2015   HDL 60 03/21/2015   LDLCALC 111 03/21/2015   TRIG 150 (H) 03/21/2015   CHOLHDL 3.4 03/21/2015  She is not on a statin  - last eye exam was in 04/2017. 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 2015  -+ Numbness and tingling in her feet.  On Neurontin.  Pt has FH of DM in father.  She also has a history of irregular menstrual cycles, toenail fungus, boils.  ROS: Constitutional: + weight gain, + weight loss, + fatigue, no subjective hyperthermia, no subjective hypothermia, + excessive urination and nocturia Eyes: + blurry vision, no xerophthalmia ENT: no sore throat, no nodules palpated in neck, no dysphagia, no odynophagia, no hoarseness, no tinnitus, no hypoacusis Cardiovascular: + CP, + SOB, no palpitations, no leg  swelling Respiratory: + cough, + SOB, no wheezing Gastrointestinal: + N, + V, no D, no C, + acid reflux Musculoskeletal: no muscle, no joint aches Skin: no rash, no hair loss Neurological: no tremors, +  numbness and tingling - "my toes are on fire"/no dizziness/no HAs Psychiatric: no depression, no anxiety  Past Medical History:  Diagnosis Date  . Diabetes mellitus    Type 2, insulin resistant   Past Surgical History:  Procedure Laterality Date  . CERVICAL BIOPSY  2004  . CESAREAN SECTION N/A 06/02/2012   Procedure: CESAREAN SECTION;  Surgeon:  Mora Bellman, MD;  Location: [facility];  Service: Obstetrics;  Laterality: N/A;   Social History   Socioeconomic History  . Marital status: Single    Spouse name: Not on file  . Number of children: Not on file  . Years of education: Not on file  . Highest education level: Not on file  Occupational History  . Not on file  Social Needs  . Financial resource strain: Not on file  . Food insecurity:    Worry: Not on file    Inability: Not on file  . Transportation needs:    Medical: Not on file    Non-medical: Not on file  Tobacco Use  . Smoking status: Never Smoker  . Smokeless tobacco: Never Used  Substance and Sexual Activity  . Alcohol use: No  . Drug use: No  . Sexual activity: Yes    Birth control/protection: None  Lifestyle  . Physical activity:    Days per week: Not on file    Minutes per session: Not on file  . Stress: Not on file  Relationships  . Social connections:    Talks on phone: Not on file    Gets together: Not on file    Attends religious service: Not on file    Active member of club or organization: Not on file    Attends meetings of clubs or organizations: Not on file    Relationship status: Not on file  . Intimate partner violence:    Fear of current or ex partner: Not on file    Emotionally abused: Not on file    Physically abused: Not on file    Forced sexual activity: Not on file  Other Topics Concern  . Not on file  Social History Narrative  . Not on file   Current Outpatient Medications on File Prior to Visit  Medication Sig Dispense Refill  . ACCU-CHEK FASTCLIX LANCETS MISC Check sugar 5 x daily 204 each 3  . albuterol (VENTOLIN HFA) 108 (90 Base) MCG/ACT inhaler Inhale 1-2 puffs into the lungs every 6 (six) hours as needed for wheezing or shortness of breath. 1 Inhaler 0  . Cholecalciferol 50000 units capsule Take 1 capsule (50,000 Units total) by mouth every 7 (seven) days. (Patient not taking: Reported on 03/27/2018) 10 capsule 0  .  fluticasone (FLONASE) 50 MCG/ACT nasal spray Place 2 sprays into both nostrils daily. 16 g 0  . gabapentin (NEURONTIN) 100 MG capsule Take 1 capsule (100 mg total) by mouth at bedtime. 90 capsule 3  . glucose blood (ACCU-CHEK SMARTVIEW) test strip Check sugar 5 x daily 200 each 3  . guaiFENesin-codeine 100-10 MG/5ML syrup Take 5 mLs by mouth 3 (three) times daily as needed for cough. 120 mL 0  . Insulin Aspart FlexPen 100 UNIT/ML SOPN INJECT 20 UNITS SUBCUTANEOUSLY WITH BREAKFAST AND WITH LUNCH AND 15 UNITS WITH DINNER 15 mL 0  . Insulin  Isophane & Regular Human (HUMULIN 70/30 MIX) (70-30) 100 UNIT/ML PEN Inject 40 Units into the skin daily before breakfast AND 20 Units daily before supper. 15 mL 1  . Insulin Pen Needle (B-D ULTRAFINE III SHORT PEN) 31G X 8 MM MISC 1 Container by Does not apply route as needed. 1 each 11  . Insulin Syringe-Needle U-100 (ULTICARE INSULIN SYRINGE) 31G X 5/16" 1 ML MISC Use to inject insulin. 200 each 1  . Lancets Misc. (ACCU-CHEK SOFTCLIX LANCET DEV) KIT 1 application by Does not apply route 2 (two) times daily. (Patient not taking: Reported on 03/27/2018) 1 kit 2  . LANTUS SOLOSTAR 100 UNIT/ML Solostar Pen INJECT 30 UNITS SUBCUTANEOUSLY TWICE DAILY 45 mL 0  . losartan (COZAAR) 25 MG tablet Take 1 tablet (25 mg total) by mouth daily. 90 tablet 3   No current facility-administered medications on file prior to visit.    Allergies  Allergen Reactions  . Lisinopril Cough   Family History  Problem Relation Age of Onset  . Diabetes Father    PE: LMP 05/22/2018  Wt Readings from Last 3 Encounters:  05/29/18 223 lb 12.8 oz (101.5 kg)  03/27/18 222 lb (100.7 kg)  03/07/18 224 lb (101.6 kg)   Constitutional:  in NAD  The physical exam was not performed (virtual visit).  ASSESSMENT: 1. 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 Patient with long-standing,  uncontrolled diabetes, on basal-bolus insulin regimen, with still very poor control.  Latest HbA1c was reviewed and it was very high, at 13%.  Upon questioning, she reduced the dose of long-acting insulin to stretch her supply and also she misses a lot rapid acting insulin doses.  Also, she tells me that she cannot afford NovoLog. -Since her sugars are so high, we discussed about adding regular insulin instead of NovoLog.  We discussed that this is over-the-counter at [facility].  If her insurance allows, I would like to switch her from Lantus to [place] so we can give her a larger dose at one time and also for the flexibility of being able to take it at anytime of the day.  I also suggested a GLP-1 receptor agonist to help with both blood sugars and weight.  I advised her that if she starts taking the GLP-1 receptor agonist and may be able to reduce the regular insulin and possibly also Tresiba dose. -We also discussed about the importance of diet and I gave her specific examples of healthier meals.  I recommended to stop bagels and also positive liquids and replace these with either whole-grain bread or oatmeal.  I advised her to not snack between meals and to definitely eliminate sweet drinks and chips.  I explained that if she continues to drink sweet drinks, we will be able to control her diabetes.  We may need a referral to nutrition at next visit. - I suggested to:  Patient Instructions  Please change from Lantus to: - Tresiba U200 60 units daily  After you finish Novolog, start: - Relion regular insulin 30 min before meals: 15-20 units before each meal  Please start: - Ozempic 0.25 mg weekly in a.m. (for example on Sunday morning) x 4 weeks, then increase to 0.5 mg weekly in a.m. if no nausea or hypoglycemia.  STOP sweet drinks.  Please let me know if the sugars are consistently <80 or >200.  Please return in 3 months  with your sugar log.   - check sugars at different times of the day -  check 3x a day, rotating checks - discussed about CBG targets for treatment: 80-130 mg/dL before meals and <180 mg/dL after meals; target HbA1c <7%. - given sugar log and advised how to fill it and to bring it at next appt  - given foot care handout and explained the principles  - given instructions for hypoglycemia management "15-15 rule"  - advised for yearly eye exams  - Return to clinic in 3 mo with sugar log   Philemon Kingdom, MD PhD [facility] Endocrinology

## 2018-05-30 NOTE — Telephone Encounter (Signed)
She has [employer] - can you please check with the pharmacy if there is a pen nr. Issue? If not, then we need a PA for Ozempic.

## 2018-06-01 ENCOUNTER — Telehealth: Payer: Self-pay | Admitting: Pharmacist

## 2018-06-01 ENCOUNTER — Telehealth: Payer: BLUE CROSS/BLUE SHIELD | Admitting: Pharmacist

## 2018-06-01 NOTE — Telephone Encounter (Signed)
Received message from PCP Dr. Frances Furbish regarding cost concerns with patient's insulin therapy. Per her note, and endocrinology visit on 5/19, patient has been rationing [place] and Novolog.   As patient has Nurse, learning disability, she can utilize Child psychotherapist coupon cards in combination with her insurance to reduce copayments.   Left patient a message explaining the process for downloading coupon cards available from Tresiba/Novolog websites. Encouraged her to search "Tresiba coupon card" or "Novolog coupon card" and download the coupon to use at the pharmacy in combination with her insurance.   Evaristo Bury: [email or web address] Novolog: https://www.novocare.com/novolog/savings-card.[email or web address]  These are also available for any GLP1 that she eventually gets started on.   Left her my contact information should she have any future questions.   Catie Feliz Beam, PharmD, CPP PGY2 Ambulatory Care Pharmacy Resident, [facility] Phone: [phone]

## 2018-06-02 ENCOUNTER — Encounter: Payer: Self-pay | Admitting: Internal Medicine

## 2018-06-06 ENCOUNTER — Other Ambulatory Visit: Payer: Self-pay | Admitting: Internal Medicine

## 2018-06-06 DIAGNOSIS — IMO0002 Reserved for concepts with insufficient information to code with codable children: Secondary | ICD-10-CM

## 2018-06-06 DIAGNOSIS — E1165 Type 2 diabetes mellitus with hyperglycemia: Secondary | ICD-10-CM

## 2018-06-06 MED ORDER — INSULIN ASPART FLEXPEN 100 UNIT/ML ~~LOC~~ SOPN: 15.0000 [IU] | PEN_INJECTOR | Freq: Three times a day (TID) | SUBCUTANEOUS | 11 refills | Status: DC

## 2018-07-03 ENCOUNTER — Telehealth: Payer: Self-pay | Admitting: Pharmacist

## 2018-07-03 NOTE — Telephone Encounter (Signed)
Inadvertent entry

## 2018-07-21 ENCOUNTER — Other Ambulatory Visit: Payer: Self-pay

## 2018-07-25 ENCOUNTER — Encounter: Payer: Self-pay | Admitting: Internal Medicine

## 2018-07-25 ENCOUNTER — Other Ambulatory Visit: Payer: Self-pay

## 2018-07-25 ENCOUNTER — Ambulatory Visit (INDEPENDENT_AMBULATORY_CARE_PROVIDER_SITE_OTHER): Payer: BC Managed Care – PPO | Admitting: Internal Medicine

## 2018-07-25 VITALS — BP 130/80 | HR 90 | Ht 62.0 in | Wt 235.0 lb

## 2018-07-25 DIAGNOSIS — IMO0002 Reserved for concepts with insufficient information to code with codable children: Secondary | ICD-10-CM

## 2018-07-25 DIAGNOSIS — Z6841 Body Mass Index (BMI) 40.0 and over, adult: Secondary | ICD-10-CM

## 2018-07-25 DIAGNOSIS — M7989 Other specified soft tissue disorders: Secondary | ICD-10-CM

## 2018-07-25 DIAGNOSIS — E118 Type 2 diabetes mellitus with unspecified complications: Secondary | ICD-10-CM | POA: Diagnosis not present

## 2018-07-25 DIAGNOSIS — E1165 Type 2 diabetes mellitus with hyperglycemia: Secondary | ICD-10-CM

## 2018-07-25 DIAGNOSIS — Z794 Long term (current) use of insulin: Secondary | ICD-10-CM

## 2018-07-25 MED ORDER — OZEMPIC (0.25 OR 0.5 MG/DOSE) 2 MG/1.5ML ~~LOC~~ SOPN: 0.5000 mg | PEN_INJECTOR | SUBCUTANEOUS | 5 refills | Status: DC

## 2018-07-25 MED ORDER — TRESIBA FLEXTOUCH 200 UNIT/ML ~~LOC~~ SOPN: 50.0000 [IU] | PEN_INJECTOR | Freq: Every day | SUBCUTANEOUS | 5 refills | Status: DC

## 2018-07-25 MED ORDER — INSULIN ASPART FLEXPEN 100 UNIT/ML ~~LOC~~ SOPN: 15.0000 [IU] | PEN_INJECTOR | Freq: Three times a day (TID) | SUBCUTANEOUS | 11 refills | Status: DC

## 2018-07-25 NOTE — Progress Notes (Signed)
Patient ID: Johnella MoloneyMarquise P Pendergrass, female   DOB: [birth year], 33 y.o.   MRN: [id number]   HPI: Johnella MoloneyMarquise P Pattison is a 33 y.o.-year-old female, initially referred by her PCP, Dr. Frances FurbishWinfrey, returning for follow-up for DM2, dx in 2004, insulin-dependent since 2009, uncontrolled, with long-term complications (peripheral neuropathy, moderate retinopathy + macular edema).  Last visit 2 months ago.  At this visit, she complains of leg swelling that developed over the last 2 months.  She initially thought it was related to [place] which was started at last visit so she stopped this after 3 weeks and is now on 70/30 insulin.  She did not start Ozempic as I recommended at last visit.  Reviewed latest HbA1c level: Lab Results  Component Value Date   HGBA1C 14.2 (A) 05/29/2018   HGBA1C 13.0 (A) 03/07/2018   HGBA1C 11.7 (A) 12/02/2017   HGBA1C 10.6 (A) 08/29/2017   HGBA1C 14.5 05/16/2017   HGBA1C 13.6 08/16/2016   HGBA1C 14.0 05/24/2016   HGBA1C 13.5 11/24/2015   HGBA1C 14.1 03/21/2015   HGBA1C 13.3 07/17/2014   At last visit she was on: - Lantus 40 units 2x a day >> now 60 units at bedtime to ration it - Novolog 15-20 units 3x a day, before meals - misses 1-3 dose a day She tried Metformin IR and ER >> N/D, however, she tolerated Glucophage well. She was recommended to use 70/30 insulin at her appointment with PCP yesterday but she did not start this yet pending our appointment today.  We changed to: -  -now off due to leg swelling -  >> BUT she misunderstood instructions >> started on 70/30 40 units in am and 20 units in pm - -He did not start as she forgot...  Pt.checks her sugars more than 4 times a day with his CGM.  Freestyle libre CGM parameters: - Average: 250 - % active CGM time: 36% of the time - Glucose variability 44.4 (target < or = to 36%) - time in range:  - very low (<54): 0% - low (54-69): 1% - normal range (70-180): 35% - high sugars (181-250): 12% - very high sugars (>250):  52%  At last visit, sugars were: - am: 197-592 - 2h after b'fast: n/c - before lunch: n/c - 2h after lunch: n/c - before dinner: n/c - 2h after dinner: n/c - bedtime: 300-HI - nighttime: n/c Lowest sugar was 197 >> 62; she has hypoglycemia awareness 80s and 90s. Highest sugar was HI >> HI.  Glucometer: AccuChek  Pt's meals are: - Breakfast: bagel or grits, veggie sausage - Lunch: sandwich with vegan deli meat, taco, home cooked meals - Dinner: fish + veggies +/- starch - Snacks: chips,  >> stopped since last OV! No meat for 2 years, now reintroducing it. She decreased intake of starches.  Her last visit.  -No history of CKD, last BUN/creatinine:  Lab Results  Component Value Date   BUN 10 05/29/2018   BUN 12 05/22/2018   CREATININE 0.91 05/29/2018   CREATININE 1.11 (H) 05/22/2018  On Cozaar.  -+ HL; last set of lipids: Lab Results  Component Value Date   CHOL 201 (H) 03/21/2015   HDL 60 03/21/2015   LDLCALC 111 03/21/2015   TRIG 150 (H) 03/21/2015   CHOLHDL 3.4 03/21/2015  She is not on a statin.  - last eye exam was in 04/2017: + Moderate NPDR OS and OD, + macular edema- dx. 2015  -+ Numbness and tingling in her feet.  On Neurontin.  Pt has FH of DM in father.  She also has a history of irregular menstrual cycles, toenail fungus, boils.  ROS: Constitutional: + weight gain/no weight loss, no fatigue, no subjective hyperthermia, no subjective hypothermia Eyes: no blurry vision, no xerophthalmia ENT: no sore throat, no nodules palpated in neck, no dysphagia, no odynophagia, no hoarseness Cardiovascular: no CP/no SOB/no palpitations/+ leg swelling Respiratory: no cough/no SOB/no wheezing Gastrointestinal: no N/no V/no D/no C/no acid reflux Musculoskeletal: no muscle aches/no joint aches Skin: no rashes, no hair loss Neurological: no tremors/+ numbness/+ tingling/no dizziness  I reviewed pt's medications, allergies, PMH, social hx, family hx, and changes were  documented in the history of present illness. Otherwise, unchanged from my initial visit note.  Past Medical History:  Diagnosis Date  . Diabetes mellitus    Type 2, insulin resistant   Past Surgical History:  Procedure Laterality Date  . CERVICAL BIOPSY  2004  . CESAREAN SECTION N/A 06/02/2012   Procedure: CESAREAN SECTION;  Surgeon: Catalina AntiguaPeggy Constant, MD;  Location: [facility];  Service: Obstetrics;  Laterality: N/A;   Social History   Socioeconomic History  . Marital status: Single    Spouse name: Not on file  . Number of children: Not on file  . Years of education: Not on file  . Highest education level: Not on file  Occupational History  . Not on file  Social Needs  . Financial resource strain: Not on file  . Food insecurity    Worry: Not on file    Inability: Not on file  . Transportation needs    Medical: Not on file    Non-medical: Not on file  Tobacco Use  . Smoking status: Never Smoker  . Smokeless tobacco: Never Used  Substance and Sexual Activity  . Alcohol use: No  . Drug use: No  . Sexual activity: Yes    Birth control/protection: None  Lifestyle  . Physical activity    Days per week: Not on file    Minutes per session: Not on file  . Stress: Not on file  Relationships  . Social Musicianconnections    Talks on phone: Not on file    Gets together: Not on file    Attends religious service: Not on file    Active member of club or organization: Not on file    Attends meetings of clubs or organizations: Not on file    Relationship status: Not on file  . Intimate partner violence    Fear of current or ex partner: Not on file    Emotionally abused: Not on file    Physically abused: Not on file    Forced sexual activity: Not on file  Other Topics Concern  . Not on file  Social History Narrative  . Not on file   Current Outpatient Medications on File Prior to Visit  Medication Sig Dispense Refill  . albuterol (VENTOLIN HFA) 108 (90 Base) MCG/ACT inhaler Inhale 1-2  puffs into the lungs every 6 (six) hours as needed for wheezing or shortness of breath. 1 Inhaler 0  . Continuous Blood Gluc Sensor (FREESTYLE LIBRE 14 DAY SENSOR) MISC 1 each by Does not apply route every 14 (fourteen) days. Change every 2 weeks 2 each 11  . fluticasone (FLONASE) 50 MCG/ACT nasal spray Place 2 sprays into both nostrils daily. 16 g 0  . gabapentin (NEURONTIN) 100 MG capsule Take 1 capsule (100 mg total) by mouth at bedtime. 90 capsule 3  . Insulin Aspart FlexPen 100 UNIT/ML  SOPN Inject 15-20 Units into the skin 3 (three) times daily before meals. 15 mL 11  . Insulin Degludec (TRESIBA FLEXTOUCH) 200 UNIT/ML SOPN Inject 60 Units into the skin daily. 3 pen 5  . Insulin Pen Needle (B-D ULTRAFINE III SHORT PEN) 31G X 8 MM MISC 1 Container by Does not apply route as needed. 1 each 11  . Insulin Syringe-Needle U-100 (ULTICARE INSULIN SYRINGE) 31G X 5/16" 1 ML MISC Use to inject insulin. 200 each 1  . losartan (COZAAR) 25 MG tablet Take 1 tablet (25 mg total) by mouth daily. 90 tablet 3  . Cholecalciferol 50000 units capsule Take 1 capsule (50,000 Units total) by mouth every 7 (seven) days. (Patient not taking: Reported on 03/27/2018) 10 capsule 0  . Dulaglutide (TRULICITY) 6.20 BT/5.9RC SOPN Inject 0.75 mg in am weekly under skin (Patient not taking: Reported on 07/25/2018) 4 pen 1  . Semaglutide,0.25 or 0.5MG /DOS, (OZEMPIC, 0.25 OR 0.5 MG/DOSE,) 2 MG/1.5ML SOPN Inject 0.5 mg into the skin once a week. (Patient not taking: Reported on 07/25/2018) 2 pen 5   No current facility-administered medications on file prior to visit.    Allergies  Allergen Reactions  . Lisinopril Cough   Family History  Problem Relation Age of Onset  . Diabetes Father    PE: BP 130/80   Pulse 90   Ht 5\' 2"  (1.575 m)   Wt 235 lb (106.6 kg)   SpO2 96%   BMI 42.98 kg/m  Wt Readings from Last 3 Encounters:  07/25/18 235 lb (106.6 kg)  05/29/18 223 lb 12.8 oz (101.5 kg)  03/27/18 222 lb (100.7 kg)    Constitutional: overweight, in NAD Eyes: PERRLA, EOMI, no exophthalmos ENT: moist mucous membranes, no thyromegaly, no cervical lymphadenopathy Cardiovascular: RRR, No MRG Respiratory: CTA B Gastrointestinal: abdomen soft, NT, ND, BS+ Musculoskeletal: no deformities, strength intact in all 4 Skin: moist, warm, no rashes Neurological: no tremor with outstretched hands, DTR normal in all 4  ASSESSMENT: 1. 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  Leg swelling  3.  Obesity  PLAN:  1. Patient with longstanding, uncontrolled, type 2 diabetes, previously on basal-bolus insulin regimen but with many missed doses and with problems affording the NovoLog at last visit.  At that time, we switched to [place] and I advised her to use regular insulin instead of NovoLog.  However, after she started [place] she noticed leg swelling and she stopped this.  Instead of regular insulin, however, he obtained 70/30 insulin from the pharmacy and she continued with this.  She is using a total daily dose of 60 units now and her sugars are very high and fluctuating, especially dropping during the night. -At this visit, she tells me that she cannot afford NovoLog and I advised her to start this. -I also suggested to stop the 70/30 insulin and restart [place] but at a lower dose.  She agrees to do this. -She forgot about my suggestion to start Ozempic and we again discussed about starting this and she agrees.  I sent another prescription to her pharmacy -Since last visit, she stopped sweet drinks, for which I congratulated her. - I suggested to:  Patient Instructions  Please change: - Tresiba U200 50 units daily  Start: - NovoLog 15 min before meals: 15-20 units before each meal  STOP 70/30 insulin.  Please start Ozempic 0.25 mg weekly in a.m. (for example  on Sunday morning) x 4 weeks, then increase to 0.5 mg  weekly in a.m. if no nausea or hypoglycemia.  Please return in 3 months with your sugar log.   - advised to check sugars at different times of the day - 4x a day, rotating check times - advised for yearly eye exams >> she is UTD - return to clinic in 3 months  2.  Leg swelling -Most likely related to insulin since it started after she started to take it consistently -If not improved I advised her to get in touch with me and she may transiently need a diuretic  3.  Obesity -Gained 12 pounds since last visit  -Start Ozempic which should also help with weight loss -She is also interested in bariatric surgery and [facility] and given brochures -Placed referral today.  Carlus Pavlovristina Susie Pousson, MD PhD [facility]

## 2018-07-25 NOTE — Patient Instructions (Addendum)
Please change: - Tresiba U200 50 units daily  Start: - NovoLog 15 min before meals: 15-20 units before each meal  STOP 70/30 insulin.  Please start Ozempic 0.25 mg weekly in a.m. (for example on Sunday morning) x 4 weeks, then increase to 0.5 mg weekly in a.m. if no nausea or hypoglycemia.  Please return in 3 months with your sugar log.

## 2018-08-08 ENCOUNTER — Telehealth: Payer: Self-pay | Admitting: Internal Medicine

## 2018-08-08 NOTE — Telephone Encounter (Signed)
Patient called "states that her insurance does not cover her Novolog and that the pharmacy said that she should call Dr Cruzita Lederer. She also wanted to advise that she is still swelling excessively"    Call back number is [phone]

## 2018-08-08 NOTE — Telephone Encounter (Signed)
Patient states that communication through [facility] is good as well as a call back

## 2018-08-09 ENCOUNTER — Other Ambulatory Visit: Payer: Self-pay

## 2018-08-09 DIAGNOSIS — IMO0002 Reserved for concepts with insufficient information to code with codable children: Secondary | ICD-10-CM

## 2018-08-09 DIAGNOSIS — E1165 Type 2 diabetes mellitus with hyperglycemia: Secondary | ICD-10-CM

## 2018-08-09 MED ORDER — INSULIN LISPRO (1 UNIT DIAL) 100 UNIT/ML (KWIKPEN): 15.0000 [IU] | PEN_INJECTOR | Freq: Three times a day (TID) | SUBCUTANEOUS | 2 refills | Status: DC

## 2018-08-09 NOTE — Telephone Encounter (Signed)
Called pt to make her aware of new orders and to follow up with PCP re: swelling. Unable to reach. Automated voice states call cannot be completed at this time and to try call again later.

## 2018-08-09 NOTE — Telephone Encounter (Signed)
SECOND ATTEMPT:  Called pt to make her aware of new orders and to follow up with PCP re: swelling. Unable to reach. Once again, automated voice states call cannot be completed at this time and to try call again later.

## 2018-08-09 NOTE — Telephone Encounter (Signed)
humalog is OK.  Please see PCP to ask about swelling

## 2018-08-09 NOTE — Telephone Encounter (Signed)
In light of insurance not covering Novolog, please advise if you are okay with changing Rx to Humalog. Please also advise if you wish to see pt to discuss her concern about "excessive swelling"

## 2018-08-10 NOTE — Telephone Encounter (Signed)
FINAL ATTEMPT  Once again, called pt to make her aware of new orders and to follow up with PCP re: swelling. Still unable to reach. Once again, automated voice states call cannot be completed at this time and to try call again later.

## 2018-08-17 NOTE — Telephone Encounter (Signed)
Pt returned call. Informed that Novolog has been changed to Humalog. Also advised to call PCP re: concerns with swelling. While on the phone, pt asked about the status of her referral for bariatric surgery. Advised I would forward this question to Dr. Cruzita Lederer as I am not seeing a referral at this time. Verbalized acceptance and understanding.

## 2018-08-18 NOTE — Telephone Encounter (Signed)
I placed the referral at last OV, but it appears that the associated name is Dr. Delrae Rend (?). Would it work if we send them the referral and have them ignore the name?

## 2018-08-18 NOTE — Telephone Encounter (Signed)
Please advise 

## 2018-08-21 ENCOUNTER — Ambulatory Visit: Payer: BC Managed Care – PPO | Admitting: Family Medicine

## 2018-08-21 NOTE — Telephone Encounter (Signed)
Referral has been addressed on our end. The referral is awaiting [facility] to finish their review and call the patient to schedule.

## 2018-08-25 ENCOUNTER — Encounter: Payer: Self-pay | Admitting: Family Medicine

## 2018-08-25 ENCOUNTER — Ambulatory Visit (INDEPENDENT_AMBULATORY_CARE_PROVIDER_SITE_OTHER): Payer: BC Managed Care – PPO | Admitting: Family Medicine

## 2018-08-25 ENCOUNTER — Other Ambulatory Visit: Payer: Self-pay

## 2018-08-25 VITALS — BP 130/80 | HR 102 | Wt 237.0 lb

## 2018-08-25 DIAGNOSIS — R600 Localized edema: Secondary | ICD-10-CM

## 2018-08-25 LAB — POCT UA - MICROALBUMIN
Albumin/Creatinine Ratio, Urine, POC: >300
Creatinine, POC: 50 mg/dL
Microalbumin Ur, POC: 150 mg/L

## 2018-08-25 MED ORDER — GABAPENTIN 100 MG PO CAPS: 100.0000 mg | ORAL_CAPSULE | Freq: Every day | ORAL | 3 refills | Status: DC

## 2018-08-25 MED ORDER — LOSARTAN POTASSIUM 25 MG PO TABS: 25.0000 mg | ORAL_TABLET | Freq: Every day | ORAL | 3 refills | Status: DC

## 2018-08-25 NOTE — Progress Notes (Signed)
Subjective:    Karina [name] - 33 y.o. female MRN [id number]  Date of birth: [birth year]  CC:  Karina [name] is here for lower extremity edema.  She would also like to discuss her frothy urine and would like a referral for bariatric surgery.  HPI: Lower extremity edema Notes that this began in late June or early July right after starting Tresiba.  She has contacted her endocrinologist office with this concern, and her endocrinologist did not think that this was the cause of her edema, she offered to start her on diuretics, but Ms. Kana did not want to pursue this.  She says that they are somewhat less swollen in the morning, but there are no other alleviating or exacerbating factors.  She denies shortness of breath.  Frothy urine Notes that her urine is more foamy than normal.  She is concerned that this is due to her diabetes.  She had a urine microalbumin of greater than 300 in August 2019, but she does not remember being prescribed losartan even though it is on her medication list.  She denies dysuria, urinary frequency, and urinary urgency.  Bariatric surgery Patient is interested in gastric sleeve surgery.  She thinks that it would be very helpful in getting her off of insulin and controlling her diabetes.  She thinks that she would be motivated to continue her weight loss efforts after the surgery since she knows that people can gain back the weight they lost with the surgery.  She would like to be connected with our clinics nutritionist.  She has called her insurance company, and they require a physician precertification for this surgery.  Their number is [phone].  Health Maintenance:  Health Maintenance Due  Topic Date Due  . PNEUMOCOCCAL POLYSACCHARIDE VACCINE AGE 66-64 HIGH RISK  07/31/1985  . OPHTHALMOLOGY EXAM  04/15/2018  . INFLUENZA VACCINE  08/12/2018    -  reports that she has never smoked. She has never used smokeless tobacco. - Review of Systems: Per HPI. -  Past Medical History: Patient Active Problem List   Diagnosis Date Noted  . Chest pain 05/26/2018  . Cough 05/16/2018  . Acute upper respiratory infection 04/05/2018  . Back pain 12/12/2017  . Lower extremity edema 08/29/2017  . Encounter for vitamin deficiency screening 08/16/2016  . 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 Boil 06/06/2015  . Syncope 04/08/2015  . Irregular menstrual cycle 07/04/2014  . Essential hypertension, benign 12/12/2013  . Annual physical exam 06/27/2013  . Morbid obesity (HCC) 05/25/2013  . Moderate nonproliferative diabetic retinopathy (HCC) 05/24/2013  . Diabetic neuropathy, type II diabetes mellitus (HCC) 05/23/2013  . Toenail fungus 01/17/2013  . 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 Depression 12/27/2012   - Medications: reviewed and updated   Objective:   Physical Exam BP 130/80   Pulse (!) 102   Wt 237 lb (107.5 kg)   LMP 08/09/2018   SpO2 95%   BMI 43.35 kg/m  Gen: NAD, alert, cooperative with exam, well-appearing, pleasant, obese CV: RRR, good S1/S2, no murmur, 2+ pitting pedal edema bilaterally Resp: CTABL, no wheezes, non-labored  Diabetic Foot Exam - Simple   Simple Foot Form Diabetic Foot exam was performed with the following findings: Yes 08/25/2018 10:00 AM  Visual Inspection No deformities, no ulcerations, no other skin breakdown bilaterally: Yes Sensation Testing Intact to touch and monofilament testing bilaterally: Yes Pulse Check Posterior  Tibialis and Dorsalis pulse intact bilaterally: Yes Comments      Assessment & Plan:   Lower extremity edema This has been a problem for the patient before according to her chart, although she does not recall this.  Concerned that this could possibly be due to nephrotic syndrome due to the urinary albumin/creatinine ratio > 300 one year ago.  Patient was prescribed losartan  at that time but does not remember this and has not been taking this medication.  Patient was counseled on the importance of taking losartan to prevent kidney damage from diabetes and is amenable to taking it now.  We will recheck urine microalbumin today.  Patient may benefit from nephrology referral depending on this result.  TSH, BMP, CBC, and BNP were obtained to assess for possible other causes of her pedal edema.  Patient was also counseled that venous stasis could also be a cause.  Although I have never seen pedal edema occur due to [place], "swelling" is listed on its side effect list.  Will defer to her endocrinologist about the management of this medication.  Morbid obesity ([place]) Counseled patient on the importance of altering her diet and activity level before and after bariatric surgery.  Patient seems motivated to do this.  Gave patient the contact information of our nutritionist, Dr. Jenne Campus.  Will call her insurance company to give precertification for the surgery.  Ambulatory referral to bariatric surgery was placed today.  Patient was counseled to see her eye doctor for her yearly visit as soon as possible.  Maia Breslow, M.D. 08/27/2018, 8:10 AM PGY-3, [facility]

## 2018-08-25 NOTE — Patient Instructions (Signed)
It was nice meeting you today Ms. Abundis!  I have refilled her gabapentin, and we are restarting the losartan, which will protect your kidneys.  We will get a urine test to see how much protein is in your urine today.  Please see your eye doctor as soon as you can.  I will call your insurance company to work on precertification for your bariatric surgery.  I have sent in the referral today.  We are getting some tests today to look into your leg edema, although it is possible that it is from [place].  Please continue [name] until you speak with your endocrinologist about this medicine.  I will contact you if any of your lab results are abnormal.  If you have any questions or concerns, please feel free to call the clinic.   Be well,  Dr. Shan Levans

## 2018-08-26 LAB — CBC
Hematocrit: 35.2 % (ref 34.0–46.6)
Hemoglobin: 11.2 g/dL (ref 11.1–15.9)
MCH: 26.1 pg — ABNORMAL LOW (ref 26.6–33.0)
MCHC: 31.8 g/dL (ref 31.5–35.7)
MCV: 82 fL (ref 79–97)
Platelets: 410 x10E3/uL (ref 150–450)
RBC: 4.29 x10E6/uL (ref 3.77–5.28)
RDW: 13.3 % (ref 11.7–15.4)
WBC: 5.3 x10E3/uL (ref 3.4–10.8)

## 2018-08-26 LAB — BASIC METABOLIC PANEL
BUN/Creatinine Ratio: 11 (ref 9–23)
BUN: 15 mg/dL (ref 6–20)
CO2: 22 mmol/L (ref 20–29)
Calcium: 8.9 mg/dL (ref 8.7–10.2)
Chloride: 97 mmol/L (ref 96–106)
Creatinine, Ser: 1.39 mg/dL — ABNORMAL HIGH (ref 0.57–1.00)
GFR calc Af Amer: 57 mL/min/{1.73_m2} — ABNORMAL LOW (ref >59)
GFR calc non Af Amer: 49 mL/min/{1.73_m2} — ABNORMAL LOW (ref >59)
Glucose: 364 mg/dL — ABNORMAL HIGH (ref 65–99)
Potassium: 4.2 mmol/L (ref 3.5–5.2)
Sodium: 133 mmol/L — ABNORMAL LOW (ref 134–144)

## 2018-08-26 LAB — TSH: TSH: 3.6 u[IU]/mL (ref 0.450–4.500)

## 2018-08-26 LAB — BRAIN NATRIURETIC PEPTIDE: BNP: 3.9 pg/mL (ref 0.0–100.0)

## 2018-08-27 NOTE — Assessment & Plan Note (Signed)
Counseled patient on the importance of altering her diet and activity level before and after bariatric surgery.  Patient seems motivated to do this.  Gave patient the contact information of our nutritionist, Dr. Jenne Campus.  Will call her insurance company to give precertification for the surgery.  Ambulatory referral to bariatric surgery was placed today.

## 2018-08-27 NOTE — Assessment & Plan Note (Signed)
This has been a problem for the patient before according to her chart, although she does not recall this.  Concerned that this could possibly be due to nephrotic syndrome due to the urinary albumin/creatinine ratio > 300 one year ago.  Patient was prescribed losartan at that time but does not remember this and has not been taking this medication.  Patient was counseled on the importance of taking losartan to prevent kidney damage from diabetes and is amenable to taking it now.  We will recheck urine microalbumin today.  Patient may benefit from nephrology referral depending on this result.  TSH, BMP, CBC, and BNP were obtained to assess for possible other causes of her pedal edema.  Patient was also counseled that venous stasis could also be a cause.  Although I have never seen pedal edema occur due to [place], "swelling" is listed on its side effect list.  Will defer to her endocrinologist about the management of this medication.

## 2018-08-29 ENCOUNTER — Encounter: Payer: Self-pay | Admitting: Family Medicine

## 2018-08-29 ENCOUNTER — Other Ambulatory Visit: Payer: Self-pay | Admitting: Family Medicine

## 2018-08-29 DIAGNOSIS — E1121 Type 2 diabetes mellitus with diabetic nephropathy: Secondary | ICD-10-CM

## 2018-08-31 NOTE — Telephone Encounter (Signed)
LMTCB for the bariatric surgery scheduler for follow up

## 2018-09-05 NOTE — Telephone Encounter (Signed)
Per the note in the system, [facility] is awaiting insurance information from the patient so they can do the precert and clear her to get scheduled

## 2018-09-05 NOTE — Telephone Encounter (Signed)
Noted, Ty!  Patient may need to call [facility] them directly on the status of her referral.

## 2018-09-14 ENCOUNTER — Other Ambulatory Visit: Payer: BC Managed Care – PPO

## 2018-09-14 ENCOUNTER — Other Ambulatory Visit: Payer: Self-pay

## 2018-09-14 DIAGNOSIS — E1121 Type 2 diabetes mellitus with diabetic nephropathy: Secondary | ICD-10-CM

## 2018-09-15 LAB — BASIC METABOLIC PANEL
BUN/Creatinine Ratio: 11 (ref 9–23)
BUN: 12 mg/dL (ref 6–20)
CO2: 19 mmol/L — ABNORMAL LOW (ref 20–29)
Calcium: 8.6 mg/dL — ABNORMAL LOW (ref 8.7–10.2)
Chloride: 97 mmol/L (ref 96–106)
Creatinine, Ser: 1.13 mg/dL — ABNORMAL HIGH (ref 0.57–1.00)
GFR calc Af Amer: 73 mL/min/{1.73_m2} (ref >59)
GFR calc non Af Amer: 63 mL/min/{1.73_m2} (ref >59)
Glucose: 500 mg/dL — ABNORMAL HIGH (ref 65–99)
Potassium: 4.3 mmol/L (ref 3.5–5.2)
Sodium: 133 mmol/L — ABNORMAL LOW (ref 134–144)

## 2018-09-15 LAB — LIPID PANEL
Chol/HDL Ratio: 4 ratio (ref 0.0–4.4)
Cholesterol, Total: 208 mg/dL — ABNORMAL HIGH (ref 100–199)
HDL: 52 mg/dL (ref >39)
LDL Chol Calc (NIH): 123 mg/dL — ABNORMAL HIGH (ref 0–99)
Triglycerides: 190 mg/dL — ABNORMAL HIGH (ref 0–149)
VLDL Cholesterol Cal: 33 mg/dL (ref 5–40)

## 2018-09-22 ENCOUNTER — Telehealth: Payer: Self-pay | Admitting: Family Medicine

## 2018-09-22 DIAGNOSIS — E1122 Type 2 diabetes mellitus with diabetic chronic kidney disease: Secondary | ICD-10-CM | POA: Diagnosis not present

## 2018-09-22 DIAGNOSIS — R809 Proteinuria, unspecified: Secondary | ICD-10-CM | POA: Diagnosis not present

## 2018-09-22 DIAGNOSIS — R319 Hematuria, unspecified: Secondary | ICD-10-CM | POA: Diagnosis not present

## 2018-09-22 DIAGNOSIS — N183 Chronic kidney disease, stage 3 (moderate): Secondary | ICD-10-CM | POA: Diagnosis not present

## 2018-09-22 NOTE — Telephone Encounter (Signed)
Clinical info completed on Work form.  Place form in Dr. Maudie Flakes box for completion.  Karina [name], CMA

## 2018-09-22 NOTE — Telephone Encounter (Signed)
Patient came into office concerning forms that needs signing by PCP. Patients last office visit was on 08-25-18. Forms were placed in white team folder.

## 2018-09-25 ENCOUNTER — Other Ambulatory Visit: Payer: Self-pay | Admitting: Nephrology

## 2018-09-25 DIAGNOSIS — N1831 Chronic kidney disease, stage 3a: Secondary | ICD-10-CM

## 2018-09-27 ENCOUNTER — Ambulatory Visit
Admission: RE | Admit: 2018-09-27 | Discharge: 2018-09-27 | Disposition: A | Discharge status: Home / Self Care | Payer: BC Managed Care – PPO | Source: Ambulatory Visit | Attending: Nephrology | Admitting: Nephrology

## 2018-09-27 DIAGNOSIS — N1831 Chronic kidney disease, stage 3a: Secondary | ICD-10-CM

## 2018-09-27 DIAGNOSIS — N189 Chronic kidney disease, unspecified: Secondary | ICD-10-CM | POA: Diagnosis not present

## 2018-09-28 NOTE — Telephone Encounter (Signed)
Form completed and placed in RN folder.  Thanks. 

## 2018-09-28 NOTE — Telephone Encounter (Signed)
LMOVM informing pt that form was ready for pick up.  Copy made for batch scanning.  Lerline Valdivia, CMA  

## 2018-10-18 DIAGNOSIS — N183 Chronic kidney disease, stage 3 unspecified: Secondary | ICD-10-CM | POA: Diagnosis not present

## 2018-10-27 ENCOUNTER — Ambulatory Visit (INDEPENDENT_AMBULATORY_CARE_PROVIDER_SITE_OTHER): Payer: BC Managed Care – PPO | Admitting: Internal Medicine

## 2018-10-27 ENCOUNTER — Encounter: Payer: Self-pay | Admitting: Internal Medicine

## 2018-10-27 ENCOUNTER — Other Ambulatory Visit: Payer: Self-pay

## 2018-10-27 VITALS — BP 126/82 | HR 112 | Ht 62.0 in | Wt 232.0 lb

## 2018-10-27 DIAGNOSIS — E1165 Type 2 diabetes mellitus with hyperglycemia: Secondary | ICD-10-CM

## 2018-10-27 DIAGNOSIS — E118 Type 2 diabetes mellitus with unspecified complications: Secondary | ICD-10-CM | POA: Diagnosis not present

## 2018-10-27 DIAGNOSIS — Z794 Long term (current) use of insulin: Secondary | ICD-10-CM | POA: Diagnosis not present

## 2018-10-27 DIAGNOSIS — IMO0002 Reserved for concepts with insufficient information to code with codable children: Secondary | ICD-10-CM

## 2018-10-27 LAB — POCT GLYCOSYLATED HEMOGLOBIN (HGB A1C): Hemoglobin A1C: 14.3 % — AB (ref 4.0–5.6)

## 2018-10-27 MED ORDER — OZEMPIC (0.25 OR 0.5 MG/DOSE) 2 MG/1.5ML ~~LOC~~ SOPN: 0.5000 mg | PEN_INJECTOR | SUBCUTANEOUS | 5 refills | Status: DC

## 2018-10-27 NOTE — Patient Instructions (Addendum)
Please continue: - Tresiba U200 50 units daily  Try to take: - NovoLog 15 to 20 units3x a day before each meal  Try to restart: - Ozempic 0.25 mg weekly in a.m. x 1 dose and then increase to 0.5 mg weekly.  Let me know as soon as you find out about the pump.  Please schedule an appt with Antonieta Iba with nutrition.   Please return in 3 months with your sugar log.

## 2018-10-27 NOTE — Progress Notes (Signed)
Patient ID: Karina MoloneyMarquise P Bertagnolli, female   DOB: 10-21-1985, 33 y.o.   MRN: [id number]   HPI: Karina [name] is a 33 y.o.-year-old female, initially referred by her PCP, Dr. Frances FurbishWinfrey, returning for follow-up for DM2, dx in 2004, insulin-dependent since 2009, uncontrolled, with long-term complications (peripheral neuropathy, moderate retinopathy + macular edema).  Last visit 3 months ago.  Received labs from her nephrologist and on 09/14/2018 her glucose was very high, at 500.  At this visit, she tells me that she is not really taking her insulin consistently and she is not taking Ozempic at all.  Reviewed HbA1c levels: Lab Results  Component Value Date   HGBA1C 14.2 (A) 05/29/2018   HGBA1C 13.0 (A) 03/07/2018   HGBA1C 11.7 (A) 12/02/2017   HGBA1C 10.6 (A) 08/29/2017   HGBA1C 14.5 05/16/2017   HGBA1C 13.6 08/16/2016   HGBA1C 14.0 05/24/2016   HGBA1C 13.5 11/24/2015   HGBA1C 14.1 03/21/2015   HGBA1C 13.3 07/17/2014   At last visit she was on 70/30 insulin and also [place] at higher dose (!) Now on: - Tresiba U200 50 units daily - Humalog 15 to 20 units before each meal - actually 1x a day, at 1 am - Ozempic 0.5 mg weekly in a.m.   She was checking her sugars more than 4 times a day with her freestyle libre CGM now.  She is not not checking sugars at all. - am: 197-592 - 2h after b'fast: n/c - before lunch: n/c - 2h after lunch: n/c - before dinner: n/c - 2h after dinner: n/c - bedtime: 300-HI - nighttime: n/c Lowest sugar was 197 >> 62 >> ?; she has hypoglycemia awareness in the 80s or 90s. Highest sugar was HI >> HI >> 500.  Glucometer: AccuChek  Pt's meals are: - Breakfast: bagel or grits, veggie sausage - Lunch: sandwich with vegan deli meat, taco, home cooked meals - Dinner: fish + veggies +/- starch - Snacks: chips,   She is working on decreasing starches.  She also replaced red meats with fish.  She also stopped soda since last visit  -No history of, last BUN/creatinine  levels have been higher:  Lab Results  Component Value Date   BUN 12 09/14/2018   BUN 15 08/25/2018   CREATININE 1.13 (H) 09/14/2018   CREATININE 1.39 (H) 08/25/2018  On Cozaar.  -+ HL; last set of lipids: Lab Results  Component Value Date   CHOL 208 (H) 09/14/2018   HDL 52 09/14/2018   LDLCALC 123 (H) 09/14/2018   TRIG 190 (H) 09/14/2018   CHOLHDL 4.0 09/14/2018  She is not on a statin.  - last eye exam was in 04/2017: + Moderate NPDR OS and OD, + macular edema - dx. 2015  -+ Numbness and tingling in her feet.  On Neurontin.  Pt has FH of DM in father.  She also has a history of irregular menstrual cycles, toenail fungus, boils.  ROS: Constitutional: no weight gain/no weight loss, no fatigue, no subjective hyperthermia, no subjective hypothermia Eyes: no blurry vision, no xerophthalmia ENT: no sore throat, no nodules palpated in neck, no dysphagia, no odynophagia, no hoarseness Cardiovascular: no CP/no SOB/no palpitations/+ leg swelling Respiratory: no cough/no SOB/no wheezing Gastrointestinal: no N/no V/no D/no C/no acid reflux Musculoskeletal: no muscle aches/no joint aches Skin: no rashes, no hair loss Neurological: no tremors/+ numbness/+ tingling/no dizziness  I reviewed pt's medications, allergies, PMH, social hx, family hx, and changes were documented in the history of present illness. Otherwise, unchanged  from my initial visit note.  Past Medical History:  Diagnosis Date  . Diabetes mellitus    Type 2, insulin resistant   Past Surgical History:  Procedure Laterality Date  . CERVICAL BIOPSY  2004  . CESAREAN SECTION N/A 06/02/2012   Procedure: CESAREAN SECTION;  Surgeon: Catalina Antigua, MD;  Location: [facility];  Service: Obstetrics;  Laterality: N/A;   Social History   Socioeconomic History  . Marital status: Single    Spouse name: Not on file  . Number of children: Not on file  . Years of education: Not on file  . Highest education level: Not on file   Occupational History  . Not on file  Social Needs  . Financial resource strain: Not on file  . Food insecurity    Worry: Not on file    Inability: Not on file  . Transportation needs    Medical: Not on file    Non-medical: Not on file  Tobacco Use  . Smoking status: Never Smoker  . Smokeless tobacco: Never Used  Substance and Sexual Activity  . Alcohol use: No  . Drug use: No  . Sexual activity: Yes    Birth control/protection: None  Lifestyle  . Physical activity    Days per week: Not on file    Minutes per session: Not on file  . Stress: Not on file  Relationships  . Social Musician on phone: Not on file    Gets together: Not on file    Attends religious service: Not on file    Active member of club or organization: Not on file    Attends meetings of clubs or organizations: Not on file    Relationship status: Not on file  . Intimate partner violence    Fear of current or ex partner: Not on file    Emotionally abused: Not on file    Physically abused: Not on file    Forced sexual activity: Not on file  Other Topics Concern  . Not on file  Social History Narrative  . Not on file   Current Outpatient Medications on File Prior to Visit  Medication Sig Dispense Refill  . albuterol (VENTOLIN HFA) 108 (90 Base) MCG/ACT inhaler Inhale 1-2 puffs into the lungs every 6 (six) hours as needed for wheezing or shortness of breath. 1 Inhaler 0  . Continuous Blood Gluc Sensor (FREESTYLE LIBRE 14 DAY SENSOR) MISC 1 each by Does not apply route every 14 (fourteen) days. Change every 2 weeks 2 each 11  . fluticasone (FLONASE) 50 MCG/ACT nasal spray Place 2 sprays into both nostrils daily. 16 g 0  . gabapentin (NEURONTIN) 100 MG capsule Take 1 capsule (100 mg total) by mouth at bedtime. 90 capsule 3  . Insulin Degludec (TRESIBA FLEXTOUCH) 200 UNIT/ML SOPN Inject 50 Units into the skin daily. 3 pen 5  . insulin lispro (HUMALOG KWIKPEN) 100 UNIT/ML KwikPen Inject 0.15-0.2  mLs (15-20 Units total) into the skin 3 (three) times daily. 18 mL 2  . Insulin Pen Needle (B-D ULTRAFINE III SHORT PEN) 31G X 8 MM MISC 1 Container by Does not apply route as needed. 1 each 11  . Insulin Syringe-Needle U-100 (ULTICARE INSULIN SYRINGE) 31G X 5/16" 1 ML MISC Use to inject insulin. 200 each 1  . losartan (COZAAR) 25 MG tablet Take 1 tablet (25 mg total) by mouth daily. 90 tablet 3  . Semaglutide,0.25 or 0.5MG /DOS, (OZEMPIC, 0.25 OR 0.5 MG/DOSE,) 2 MG/1.5ML SOPN Inject  0.5 mg into the skin once a week. 2 pen 5  . Cholecalciferol 50000 units capsule Take 1 capsule (50,000 Units total) by mouth every 7 (seven) days. (Patient not taking: Reported on 03/27/2018) 10 capsule 0   No current facility-administered medications on file prior to visit.    Allergies  Allergen Reactions  . Lisinopril Cough   Family History  Problem Relation Age of Onset  . Diabetes Father    PE: BP 126/82   Pulse (!) 112   Ht 5\' 2"  (1.575 m)   Wt 232 lb (105.2 kg)   SpO2 99%   BMI 42.43 kg/m  Wt Readings from Last 3 Encounters:  10/27/18 232 lb (105.2 kg)  08/25/18 237 lb (107.5 kg)  07/25/18 235 lb (106.6 kg)   Constitutional: overweight, in NAD Eyes: PERRLA, EOMI, no exophthalmos ENT: moist mucous membranes, no thyromegaly, no cervical lymphadenopathy Cardiovascular: Tachycardia, RR, No MRG Respiratory: CTA B Gastrointestinal: abdomen soft, NT, ND, BS+ Musculoskeletal: no deformities, strength intact in all 4 Skin: moist, warm, no rashes Neurological: no tremor with outstretched hands, DTR normal in all 4  ASSESSMENT: 1. 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  Obesity  PLAN:  1. Patient with longstanding, uncontrolled, type 2 diabetes, previously on a basal-bolus insulin regimen but with many missed doses and with problems affording NovoLog in the past.  We started [place] in the past  along with regular insulin.  However, after starting [place] she noticed leg swelling and stopped it.  Instead of regular insulin, however, she obtained 70/30 insulin from the pharmacy and she continued on this.  At last visit sugars are very high and fluctuating and especially dropping during the night. -At last visit, we stopped the 70/30 insulin and switch to basal-bolus insulin regimen with [place] and NovoLog.  We also started Ozempic at a low dose and advised to 0.5 mg weekly.  However, at this visit, she is not taking Ozempic and she is only taking [place] and NovoLog approximately once a day, in the middle of the night (she works nights). -We discussed at last visit about her expectations and reasons why she is not following instructions regarding her regimen.  She has multiple excuses about why she is not taking the insulin, however, at the end, she admitted that she hates sticking herself so many times during the day.  She tells me that she actually has an insulin pump at home and would like to start using it.  This is a Medtronic 630.  Since she admits that she does not want to stick herself, this means that her only chance to get the required insulin may be through an insulin pump.  We discussed that this may be out of warranty and I advised her to check about this first, and then to check with her insurance about the price of supplies.  If we are able to continue with the insulin pump, she will need an appointment with the diabetes educator to go over the logistics of the pump.  I did advise her that being on an insulin pump necessitates more involvement on her part and also can be more expensive.  She is determined to go ahead with this.  I advised her to let me know as soon as she knows whether we can continue with the pump.  I am wondering if a VGo patch pump would not be an  option if the Medtronic pump is not covered by her insurance anymore. -In the meantime, we discussed about possibly going  back to the 70/30 insulin regimen in which she may release take 2 insulin injections a day to cover her throughout the day although this is definitely not an ideal regimen.  However, for now, she tells me that she has enough [place] and Humalog at home and she would want to continue with this.  She is even open to restart Ozempic. -She is interested in the referral to nutrition, which are placed today.  She will need a carb counting refresher, also. -Ultimately, she absolutely needs to restart checking her sugars, now that she is off the libre CGM due to price - I suggested to:  Patient Instructions  Please continue: - Tresiba U200 50 units daily  Try to take: - Humalog 15 to 20 units 3x a day before each meal  Try to restart: - Ozempic 0.25 mg weekly in a.m. x 1 dose and then increase to 0.5 mg weekly.  Let me know as soon as you find out about the pump.  Please schedule an appt with Oran Rein with nutrition.   Please return in 3 months with your sugar log.   - we checked her HbA1c: 14.3% (higher than before) - advised to check sugars at different times of the day - 3x a day, rotating check times - advised for yearly eye exams >> she is UTD - return to clinic in 3-4 months  2.  Obesity -She gained 12 pounds before last visit, lost 3 pounds since then -We started Ozempic at last visit and we will continue with this since this may also help with weight loss -She was interested in bariatric surgery (referred at last visit) >> I referred her at last visit, however, this is not covered  -She would like to start on a medication for weight loss but we discussed that phentermine would be contraindicated with a high heart rate (at the beginning of the visit, her pulse today was 112, and she was still tachycardic at the end of the visit  - time spent with the patient: 30 minutes, of which >50% was spent in obtaining information about her diabetes, reviewing her previous labs, evaluations, and  treatments, counseling her about her condition and possible reasons for noncompliance (please see the discussed topics above), and developing a plan to further investigate and treat it; she had a number of questions which I addressed.   Carlus Pavlov, MD PhD [facility]

## 2018-12-18 ENCOUNTER — Ambulatory Visit: Payer: BC Managed Care – PPO | Admitting: Dietician

## 2018-12-29 ENCOUNTER — Ambulatory Visit: Payer: BC Managed Care – PPO | Admitting: Internal Medicine

## 2019-01-01 DIAGNOSIS — E113313 Type 2 diabetes mellitus with moderate nonproliferative diabetic retinopathy with macular edema, bilateral: Secondary | ICD-10-CM | POA: Diagnosis not present

## 2019-01-01 DIAGNOSIS — H04123 Dry eye syndrome of bilateral lacrimal glands: Secondary | ICD-10-CM | POA: Diagnosis not present

## 2019-01-01 DIAGNOSIS — H0288A Meibomian gland dysfunction right eye, upper and lower eyelids: Secondary | ICD-10-CM | POA: Diagnosis not present

## 2019-01-01 DIAGNOSIS — H1045 Other chronic allergic conjunctivitis: Secondary | ICD-10-CM | POA: Diagnosis not present

## 2019-01-01 LAB — HM DIABETES EYE EXAM

## 2019-01-18 ENCOUNTER — Ambulatory Visit (HOSPITAL_COMMUNITY)
Admission: EM | Admit: 2019-01-18 | Discharge: 2019-01-18 | Disposition: A | Discharge status: Home / Self Care | Payer: BC Managed Care – PPO | Attending: Family Medicine | Admitting: Family Medicine

## 2019-01-18 ENCOUNTER — Encounter (HOSPITAL_COMMUNITY): Payer: Self-pay

## 2019-01-18 ENCOUNTER — Ambulatory Visit
Admission: RE | Admit: 2019-01-18 | Discharge: 2019-01-18 | Disposition: A | Discharge status: Home / Self Care | Payer: BC Managed Care – PPO | Source: Ambulatory Visit

## 2019-01-18 ENCOUNTER — Other Ambulatory Visit: Payer: Self-pay

## 2019-01-18 DIAGNOSIS — U071 COVID-19: Secondary | ICD-10-CM | POA: Diagnosis not present

## 2019-01-18 DIAGNOSIS — R52 Pain, unspecified: Secondary | ICD-10-CM

## 2019-01-18 DIAGNOSIS — R059 Cough, unspecified: Secondary | ICD-10-CM

## 2019-01-18 DIAGNOSIS — R05 Cough: Secondary | ICD-10-CM

## 2019-01-18 DIAGNOSIS — R079 Chest pain, unspecified: Secondary | ICD-10-CM

## 2019-01-18 LAB — POC SARS CORONAVIRUS 2 AG -  ED
SARS Coronavirus 2 Ag: POSITIVE — AB
SARS Coronavirus 2 Ag: POSITIVE — AB

## 2019-01-18 LAB — POC SARS CORONAVIRUS 2 AG: SARS Coronavirus 2 Ag: POSITIVE — AB

## 2019-01-18 MED ORDER — BENZONATATE 100 MG PO CAPS: 100.0000 mg | ORAL_CAPSULE | Freq: Three times a day (TID) | ORAL | 0 refills | Status: DC

## 2019-01-18 NOTE — ED Provider Notes (Signed)
Karina [name]   [id number] 01/18/19 Arrival Time: 8527  ASSESSMENT & PLAN:  1. Cough   2. Generalized body aches   3. COVID-19 virus infection      Labs Reviewed  POC SARS CORONAVIRUS 2 AG -  ED - Abnormal; Notable for the following components:      Result Value   SARS Coronavirus 2 Ag POSITIVE (*)       No indication for chest imaging at this time. See AVS for written instructions regarding COVID. Work note provided with self-isolation guidelines. No respiratory distress. Discussed symptom care.  Meds ordered this encounter  Medications  . benzonatate (TESSALON) 100 MG capsule    Sig: Take 1 capsule (100 mg total) by mouth every 8 (eight) hours.    Dispense:  21 capsule    Refill:  0    Follow-up Information    Winfrey, Alcario Drought, MD.   Specialty: Family Medicine Why: As needed. Contact information: [address] [place] [address] [place].   Specialty: Urgent Care Why: If worsening or failing to improve as anticipated. Contact information: [place] [phone]          Reviewed expectations re: course of current medical issues. Questions answered. Outlined signs and symptoms indicating need for more acute intervention. Patient verbalized understanding. After Visit Summary given.   SUBJECTIVE: History from: patient. Karina [name] is a 34 y.o. female who reports abrupt onset of body aches, coughing, chest discomfort, and significant fatigue. First noted two days ago. Televisit by T. Bast NP this morning reviewed by me. Recommended coming in for COVID testing. No known exposures. Ambulatory without difficulty. No specific aggravating or alleviating factors reported. Overall normal PO intake without n/v/d. Recent travel: none. Denies: fever and headache. Normal PO intake without n/v/d.  ROS: As per HPI.   OBJECTIVE:  Vitals:   01/18/19 1701 01/18/19 1702  BP: (!) 152/77   Pulse: (!) 102   Resp: 18   Temp: 99.1 F (37.3 C)   TempSrc: Oral   SpO2: 100%   Weight:  111.1 kg    General appearance: alert; no distress but appears fatigue Eyes: PERRLA; EOMI; conjunctiva normal HENT: [place]; AT; nasal congestion Neck: supple  Lungs: speaks full sentences without difficulty; unlabored CV: reg; slight tachycardia Extremities: no edema Skin: warm and dry Neurologic: normal gait Psychological: alert and cooperative; normal mood and affect  Labs: Results for orders placed or performed during the hospital encounter of 01/18/19  POC SARS Coronavirus 2 Ag-ED - Nasal Swab (BD Veritor Kit)  Result Value Ref Range   SARS Coronavirus 2 Ag POSITIVE (A) NEGATIVE  POC SARS Coronavirus 2 Ag  Result Value Ref Range   SARS Coronavirus 2 Ag POSITIVE (A) NEGATIVE  POC SARS Coronavirus 2 Ag-ED -  Result Value Ref Range   SARS Coronavirus 2 Ag POSITIVE (A) NEGATIVE     Allergies  Allergen Reactions  . Lisinopril Cough    Past Medical History:  Diagnosis Date  . Diabetes mellitus    Type 2, insulin resistant   Social History   Socioeconomic History  . Marital status: Single    Spouse name: Not on file  . Number of children: Not on file  . Years of education: Not on file  . Highest education level: Not on file  Occupational History  . Not on file  Tobacco Use  .  Smoking status: Never Smoker  . Smokeless tobacco: Never Used  Substance and Sexual Activity  . Alcohol use: No  . Drug use: No  . Sexual activity: Yes    Birth control/protection: None  Other Topics Concern  . Not on file  Social History Narrative  . Not on file   Social Determinants of Health   Financial Resource Strain:   . Difficulty of Paying Living Expenses: Not on file  Food Insecurity:   . Worried About Charity fundraiser in the Last Year: Not on file  . Ran Out of Food in the Last Year: Not on file  Transportation Needs:   . Lack of  Transportation (Medical): Not on file  . Lack of Transportation (Non-Medical): Not on file  Physical Activity:   . Days of Exercise per Week: Not on file  . Minutes of Exercise per Session: Not on file  Stress:   . Feeling of Stress : Not on file  Social Connections:   . Frequency of Communication with Friends and Family: Not on file  . Frequency of Social Gatherings with Friends and Family: Not on file  . Attends Religious Services: Not on file  . Active Member of Clubs or Organizations: Not on file  . Attends Archivist Meetings: Not on file  . Marital Status: Not on file  Intimate Partner Violence:   . Fear of Current or Ex-Partner: Not on file  . Emotionally Abused: Not on file  . Physically Abused: Not on file  . Sexually Abused: Not on file   Family History  Problem Relation Age of Onset  . Diabetes Father    Past Surgical History:  Procedure Laterality Date  . CERVICAL BIOPSY  2004  . CESAREAN SECTION N/A 06/02/2012   Procedure: CESAREAN SECTION;  Surgeon: Mora Bellman, MD;  Location: [facility];  Service: Obstetrics;  Laterality: N/AVanessa Kick, MD 01/18/19 1851

## 2019-01-18 NOTE — Discharge Instructions (Signed)
Come into clinic or drive through site for COVID testing If your chest pain continues I recommend EKG

## 2019-01-18 NOTE — ED Provider Notes (Signed)
Virtual Visit via Video Note:  Karina [name] Ao  initiated request for Telemedicine visit with [facility] team. I connected with Yoselyn P Polinski  on 01/18/2019 at 8:40 AM  for a synchronized telemedicine visit using a video enabled HIPPA compliant telemedicine application. I verified that I am speaking with Keniya P Gasaway  using two identifiers. Janace Aris, NP  was physically located in a [facility] Urgent care site and BREIONA COUVILLON was located at a different location.   The limitations of evaluation and management by telemedicine as well as the availability of in-person appointments were discussed. Patient was informed that she  may incur a bill ( including co-pay) for this virtual visit encounter. Batsheva P [employer]vera  expressed understanding and gave verbal consent to proceed with virtual visit.     History of Present Illness:Karina [name]  is a 34 y.o. female presents with weakness, body aches, chest burning and tightness in the chest. Reporting some dry cough. She has also had some chills. Denies  Any known COVID exposure. Took 2 ASA that helped her headache. Non smoker.  She has been eating and drinking okay. No loss of taste or smell.  No SOB   Past Medical History:  Diagnosis Date  . Diabetes mellitus    Type 2, insulin resistant    Allergies  Allergen Reactions  . Lisinopril Cough        Observations/Objective:VITALS: Per patient if applicable, see vitals. GENERAL: Alert, appears well and in no acute distress. HEENT: Atraumatic, conjunctiva clear, no obvious abnormalities on inspection of external nose and ears. NECK: Normal movements of the head and neck. CARDIOPULMONARY: No increased WOB. Speaking in clear sentences. I:E ratio WNL. Coughing during visit  MS: Moves all visible extremities without noticeable abnormality. PSYCH: Pleasant and cooperative, well-groomed. Speech normal rate and rhythm. Affect is appropriate. Insight and judgement are  appropriate. Attention is focused, linear, and appropriate.  NEURO: CN grossly intact. Oriented as arrived to appointment on time with no prompting. Moves both UE equally.  SKIN: No obvious lesions, wounds, erythema, or cyanosis noted on face or hands.     Assessment and Plan: Recommended Covid testing.  Recommended coming clinic for testing due to chest pressure for EKG evaluation to rule out cardiac   Follow Up Instructions: come in clinic     I discussed the assessment and treatment plan with the patient. The patient was provided an opportunity to ask questions and all were answered. The patient agreed with the plan and demonstrated an understanding of the instructions.   The patient was advised to call back or seek an in-person evaluation if the symptoms worsen or if the condition fails to improve as anticipated.    Janace Aris, NP  01/18/2019 8:40 AM         Janace Aris, NP 01/18/19 [phone]

## 2019-01-18 NOTE — ED Triage Notes (Signed)
Pt states she has body aches, cough, chest discomfort and fatigued this started 2 days ago. Pt was to to come in this Morning by Ward Chatters NP.

## 2019-01-18 NOTE — ED Notes (Signed)
152/77 reported to [employer]

## 2019-01-19 ENCOUNTER — Telehealth: Payer: Self-pay | Admitting: General Practice

## 2019-01-19 ENCOUNTER — Telehealth: Payer: Self-pay

## 2019-01-19 ENCOUNTER — Telehealth: Payer: Self-pay | Admitting: Nurse Practitioner

## 2019-01-19 NOTE — Telephone Encounter (Signed)
Returned call to patient.  She is COVID-19 positive per encounter at [facility].[name] She was questioning who she should contact of her friends to tell them she was positive for COVID. I suggested that she contact anyone within the past week she had been in close contact with and let them know to monitor for symptoms. Symptom tier and treatment for each was read to patient. Criteria for ending isolation were read to patient Good preventative practices were discussed.  She verbalized understanding of all information. [place] Will be notified

## 2019-01-19 NOTE — Telephone Encounter (Signed)
Called to Discuss with patient about Covid symptoms and the use of bamlanivimab, a monoclonal antibody infusion for those with mild to moderate Covid symptoms and at a high risk of hospitalization.     Pt is qualified for this infusion at the [facility] infusion center due to co-morbid conditions and/or a member of an at-risk group.    Patient Active Problem List   Diagnosis Date Noted  . Chest pain 05/26/2018  . Cough 05/16/2018  . Acute upper respiratory infection 04/05/2018  . Back pain 12/12/2017  . Lower extremity edema 08/29/2017  . Encounter for vitamin deficiency screening 08/16/2016  . 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 Boil 06/06/2015  . Syncope 04/08/2015  . Irregular menstrual cycle 07/04/2014  . Essential hypertension, benign 12/12/2013  . Annual physical exam 06/27/2013  . Morbid obesity (HCC) 05/25/2013  . Moderate nonproliferative diabetic retinopathy (HCC) 05/24/2013  . Diabetic neuropathy, type II diabetes mellitus (HCC) 05/23/2013  . Toenail fungus 01/17/2013  . 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 Depression 12/27/2012      Unable to reach pt

## 2019-01-19 NOTE — Telephone Encounter (Signed)
Pt returned call, advised of positive covid result. Pt says that she would like to be advised further.    Pt would like a call back from NT.

## 2019-01-22 ENCOUNTER — Telehealth (INDEPENDENT_AMBULATORY_CARE_PROVIDER_SITE_OTHER): Payer: BC Managed Care – PPO | Admitting: Family Medicine

## 2019-01-22 DIAGNOSIS — U071 COVID-19: Secondary | ICD-10-CM

## 2019-01-22 NOTE — Progress Notes (Signed)
[facility] Telemedicine Visit  Patient consented to have virtual visit. Method of visit: Video was attempted, but technology challenges prevented patient from using video, so visit was conducted via telephone.  Encounter participants: Patient: Karina [name] - located at home Provider: [place] Merridith Dershem - located at [facility]  Others (if applicable): n/a  Chief Complaint: covid  HPI: Patient reports that she tested positive for Covid on 01/18/2019.  She states that she has been feeling delirious at times and sometimes was having some confusion on the phone on Friday or Saturday.  She states that once yesterday she felt as if she was a little bit confused. She states that she did not feel as if it was bad enough for her to go at this time and given that it was improving and she believes it was due to her being woken up by phone calls and that she was not fully awake during the conversation.  She states that it improved after she was able to wake up more and has not noticed any confusion during the day when she has not been woken up from sleep.  She states that she is unable to breathe too deeply or it causes her to start coughing.  She states that she is not really short of breath.  She denies any chest pain today although she states that initially she had a little bit of chest pain.  She states that she is able to walk from her bedroom to her bathroom without getting short of breath.  She states that she is sore and achy.  She denies any new fevers.  Patient states that she is isolating at home.  ROS: per HPI  Pertinent PMHx: T2DM, HTN, obesity  Exam:  HR: 90 General: well-appearing  Respiratory: able to speak in complete sentences without issue, NAD  Assessment/Plan:  COVID-19 Patient has tested positive for COVID-19 and is currently symptomatic. Patient reports cough, SOB, and body aches.  Denies chest pain.  Encourage symptomatic management and increase fluid intake.   -Counseled on wearing a mask, washing hands and avoiding social gatherings  -ED precautions discussed and patient expressed good understanding.  Patient encouraged that if she has any further episodes of confusion that she needs to be evaluated in the ED immediately as this may be a sign of worsening illness.  -Patient instructed to avoid others until they meet criteria for ending isolation after any suspected COVID, which are:  -24 hours with no fever (without use of medicaitons) and -respiratory symptoms have improved (e.g. cough, shortness of breath) or  -10 days since symptoms first appeared    Time spent during visit with patient: 15 minutes  [place] Dorothy Landgrebe, DO PGY-3, [facility]

## 2019-01-24 ENCOUNTER — Encounter: Payer: Self-pay | Admitting: Family Medicine

## 2019-01-24 ENCOUNTER — Encounter (INDEPENDENT_AMBULATORY_CARE_PROVIDER_SITE_OTHER): Payer: Self-pay | Admitting: Ophthalmology

## 2019-01-24 DIAGNOSIS — U071 COVID-19: Secondary | ICD-10-CM

## 2019-01-24 HISTORY — DX: COVID-19: U07.1

## 2019-01-24 NOTE — Assessment & Plan Note (Addendum)
Patient has tested positive for COVID-19 and is currently symptomatic. Patient reports cough, SOB, and body aches.  Denies chest pain.  Encourage symptomatic management and increase fluid intake.  -Counseled on wearing a mask, washing hands and avoiding social gatherings  -ED precautions discussed and patient expressed good understanding.  Patient encouraged that if she has any further episodes of confusion that she needs to be evaluated in the ED immediately as this may be a sign of worsening illness.  -Patient instructed to avoid others until they meet criteria for ending isolation after any suspected COVID, which are:  -24 hours with no fever (without use of medicaitons) and -respiratory symptoms have improved (e.g. cough, shortness of breath) or  -10 days since symptoms first appeared

## 2019-01-31 ENCOUNTER — Telehealth (INDEPENDENT_AMBULATORY_CARE_PROVIDER_SITE_OTHER): Payer: BC Managed Care – PPO | Admitting: Family Medicine

## 2019-01-31 ENCOUNTER — Ambulatory Visit (HOSPITAL_COMMUNITY)
Admission: EM | Admit: 2019-01-31 | Discharge: 2019-01-31 | Disposition: A | Discharge status: Home / Self Care | Payer: BC Managed Care – PPO | Attending: Family Medicine | Admitting: Family Medicine

## 2019-01-31 ENCOUNTER — Ambulatory Visit (INDEPENDENT_AMBULATORY_CARE_PROVIDER_SITE_OTHER): Payer: BC Managed Care – PPO

## 2019-01-31 ENCOUNTER — Encounter (HOSPITAL_COMMUNITY): Payer: Self-pay | Admitting: Emergency Medicine

## 2019-01-31 ENCOUNTER — Other Ambulatory Visit: Payer: Self-pay

## 2019-01-31 DIAGNOSIS — R0602 Shortness of breath: Secondary | ICD-10-CM | POA: Diagnosis not present

## 2019-01-31 DIAGNOSIS — I10 Essential (primary) hypertension: Secondary | ICD-10-CM

## 2019-01-31 DIAGNOSIS — U071 COVID-19: Secondary | ICD-10-CM

## 2019-01-31 NOTE — ED Triage Notes (Signed)
Tested for covid on 01/18/2019.  While in quarantine has noticed sob and random episodes of gasping for air. Patient is speaking in complete sentences.

## 2019-01-31 NOTE — Discharge Instructions (Signed)
You have been seen at the [facility] today for feelings of shortness of breath. Your evaluation today was not suggestive of any emergent condition requiring medical intervention at this time. Your chest x-ray did not show any worrisome changes. However, some medical problems make take more time to appear. Therefore, it's very important that you pay attention to any new symptoms or worsening of your current condition.  Please proceed directly to the Emergency Department immediately should you feel worse in any way or have any of the following symptoms: chest pain, pain that spreads to your arm, neck, jaw, back or abdomen, worsening feelings of shortness of breath, or nausea and vomiting.

## 2019-01-31 NOTE — Progress Notes (Signed)
Virtual Visit via Telephone Note (video not working)  I connected with Karina [name] on 01/31/19 at  9:30 AM EST by telephone and verified that I am speaking with the correct person using two identifiers.  Location: Patient: at home Provider: [facility] clinic   I discussed the limitations, risks, security and privacy concerns of performing an evaluation and management service by telephone and the availability of in person appointments. I also discussed with the patient that there may be a patient responsible charge related to this service. The patient expressed understanding and agreed to proceed.   History of Present Illness: Patient with history of Covid positive test on 7 January.  This is now her second video visit follow-up since then, on 11 January she spoke to Dr. Talbert Forest and described that she was having lightheadedness and feeling like she was passing out.  She said that has been intermittently present but her symptoms are now described as an occasional feeling of gasping for breath.  She says that she will have short episodes of up to a minute where she will feel like she cannot breathe, she says these happen multiple times per day with no specific timing with activity level.  She says these episodes occasionally have some lightheadedness with them but she has not passed out and experiences no chest pain.  She does not have an oxygen sensor at home.     Observations/Objective: Speaking in full sentences, does not appear to be in respiratory distress on the phone  Assessment and Plan: Covid pos, ? Of O2 need vs unstable respiratory status:  these intermittent periods of gasping of breath are concerning, even more so when coupled with prior reports of lightheadedness and feeling like she is passing out.  We discussed emergency department versus urgent care evaluation and told her that if her oxygenation and vital signs are fine it is possible the urgent care will not tell her to go to the  emergency department but that I think she needs to be seen and evaluated in person somewhere.  Patient agrees and says she will go to the urgent care  Follow Up Instructions:    I discussed the assessment and treatment plan with the patient. The patient was provided an opportunity to ask questions and all were answered. The patient agreed with the plan and demonstrated an understanding of the instructions.   The patient was advised to call back or seek an in-person evaluation if the symptoms worsen or if the condition fails to improve as anticipated.  I provided 10 minutes of non-face-to-face time during this encounter.   Karina Rolling, DO

## 2019-02-01 NOTE — ED Provider Notes (Signed)
[place]   [id number] 01/31/19 Arrival Time: [place] PLAN:  1. Shortness of breath   2. COVID-19 virus infection   3. Essential hypertension     I have personally viewed the imaging studies ordered this visit. CXR without signs of PNA. Discussed and reassured. VSS. No suspicion of cardiac etiology.  Very likely related to recent COVID infection and deconditioning. Is comfortable monitoring symptoms at home.   Follow-up Information    Winfrey, Alcario Drought, MD.   Specialty: Family Medicine Why: If worsening or failing to improve as anticipated. Contact information: [address] [place] [address] [phone]       Also to recheck BP.    Reviewed expectations re: course of current medical issues. Questions answered. Outlined signs and symptoms indicating need for more acute intervention. Patient verbalized understanding. After Visit Summary given.   SUBJECTIVE:  Chart review: Tested positive for COVID on 01/18/2019.  History from: patient. Karina [name] is a 34 y.o. female who presents with complaint of intermittent feelings of being short of breath. "Feel like I need to randomly gasp and catch my breath". Happens at rest and sometimes with exertion. Has not limited daily activities. Lasts a second or two then resolves. No associated CP/n/v. Afebrile. Ambulatory without difficulty. No specific aggravating or alleviating factors reported. No home treatment reported. Does not wake her from sleep.  Social History   Tobacco Use  Smoking Status Never Smoker  Smokeless Tobacco Never Used   Illicit drug use: none.   Social History   Substance and Sexual Activity  Alcohol Use No   Increased blood pressure noted today. Reports that she is not currently treated.  She reports no swelling of ankles, no orthostatic dizziness or lightheadedness, no orthopnea or paroxysmal nocturnal dyspnea, no palpitations and no intermittent  claudication symptoms.  ROS: As per HPI. All other systems negative.   OBJECTIVE:  Vitals:   01/31/19 1419  BP: (!) 151/88  Pulse: 96  Resp: 20  Temp: 97.9 F (36.6 C)  TempSrc: Oral  SpO2: 98%    General appearance: alert, oriented, no acute distress Eyes: PERRLA; EOMI; conjunctivae normal HENT: normocephalic; atraumatic Neck: supple with FROM Lungs: without labored respirations; CTAB Heart: regular rate and rhythm without murmer Chest Wall: without tenderness to palpation Abdomen: soft, non-tender; bowel sounds normal; no masses or organomegaly; no guarding or rebound tenderness Extremities: without edema; without calf swelling or tenderness; symmetrical without gross deformities Skin: warm and dry; without rash or lesions Psychological: alert and cooperative; normal mood and affect  Labs Reviewed: Results for orders placed or performed during the hospital encounter of 01/18/19  POC SARS Coronavirus 2 Ag-ED - Nasal Swab (BD Veritor Kit)  Result Value Ref Range   SARS Coronavirus 2 Ag POSITIVE (A) NEGATIVE  POC SARS Coronavirus 2 Ag  Result Value Ref Range   SARS Coronavirus 2 Ag POSITIVE (A) NEGATIVE  POC SARS Coronavirus 2 Ag-ED -  Result Value Ref Range   SARS Coronavirus 2 Ag POSITIVE (A) NEGATIVE     Imaging: DG Chest 2 View  Result Date: 01/31/2019 CLINICAL DATA:  Shortness of breath.  COVID-19 positive EXAM: CHEST - 2 VIEW COMPARISON:  May 22 2018 FINDINGS: Lungs are clear. Heart size and pulmonary vascularity are normal. No adenopathy. No bone lesions. IMPRESSION: Lungs clear.  Cardiac silhouette normal.  No adenopathy. Electronically Signed   By: Lowella Grip III M.D.   On: 01/31/2019 14:38     Allergies  Allergen Reactions  . Lisinopril Cough    Past Medical History:  Diagnosis Date  . Diabetes mellitus    Type 2, insulin resistant  . Syncope 04/08/2015   Social History   Socioeconomic History  . Marital status: Single    Spouse name: Not  on file  . Number of children: Not on file  . Years of education: Not on file  . Highest education level: Not on file  Occupational History  . Not on file  Tobacco Use  . Smoking status: Never Smoker  . Smokeless tobacco: Never Used  Substance and Sexual Activity  . Alcohol use: No  . Drug use: No  . Sexual activity: Yes    Birth control/protection: None  Other Topics Concern  . Not on file  Social History Narrative  . Not on file   Social Determinants of Health   Financial Resource Strain:   . Difficulty of Paying Living Expenses: Not on file  Food Insecurity:   . Worried About Charity fundraiser in the Last Year: Not on file  . Ran Out of Food in the Last Year: Not on file  Transportation Needs:   . Lack of Transportation (Medical): Not on file  . Lack of Transportation (Non-Medical): Not on file  Physical Activity:   . Days of Exercise per Week: Not on file  . Minutes of Exercise per Session: Not on file  Stress:   . Feeling of Stress : Not on file  Social Connections:   . Frequency of Communication with Friends and Family: Not on file  . Frequency of Social Gatherings with Friends and Family: Not on file  . Attends Religious Services: Not on file  . Active Member of Clubs or Organizations: Not on file  . Attends Archivist Meetings: Not on file  . Marital Status: Not on file  Intimate Partner Violence:   . Fear of Current or Ex-Partner: Not on file  . Emotionally Abused: Not on file  . Physically Abused: Not on file  . Sexually Abused: Not on file   Family History  Problem Relation Age of Onset  . Diabetes Father    Past Surgical History:  Procedure Laterality Date  . CERVICAL BIOPSY  2004  . CESAREAN SECTION N/A 06/02/2012   Procedure: CESAREAN SECTION;  Surgeon: Mora Bellman, MD;  Location: [facility];  Service: Obstetrics;  Laterality: N/AVanessa Kick, MD 02/01/19 [phone]

## 2019-02-15 ENCOUNTER — Ambulatory Visit (INDEPENDENT_AMBULATORY_CARE_PROVIDER_SITE_OTHER)
Admission: RE | Admit: 2019-02-15 | Discharge: 2019-02-15 | Disposition: A | Discharge status: Home / Self Care | Payer: BC Managed Care – PPO | Source: Ambulatory Visit

## 2019-02-15 DIAGNOSIS — R05 Cough: Secondary | ICD-10-CM

## 2019-02-15 DIAGNOSIS — R059 Cough, unspecified: Secondary | ICD-10-CM

## 2019-02-15 MED ORDER — ALBUTEROL SULFATE HFA 108 (90 BASE) MCG/ACT IN AERS: 2.0000 | INHALATION_SPRAY | RESPIRATORY_TRACT | 0 refills | Status: DC | PRN

## 2019-02-15 MED ORDER — BENZONATATE 100 MG PO CAPS: 100.0000 mg | ORAL_CAPSULE | Freq: Three times a day (TID) | ORAL | 0 refills | Status: DC

## 2019-02-15 NOTE — Discharge Instructions (Addendum)
Take the [facility] as needed for your cough.  Use the albuterol inhaler as needed.    Follow up with your primary care provider or come here to be seen in person if your symptoms are not improving.

## 2019-02-15 NOTE — ED Provider Notes (Signed)
Virtual Visit via Video Note:  Karina [name]  initiated request for Telemedicine visit with [name]. I connected with Karina [name]  on 02/15/2019 at 10:23 AM  for a synchronized telemedicine visit using a video enabled HIPPA compliant telemedicine application. I verified that I am speaking with Karina [name]  using two identifiers. Mickie Bail, NP  was physically located in a [facility] Urgent care site and Karina [name] was located at a different location.   The limitations of evaluation and management by telemedicine as well as the availability of in-person appointments were discussed. Patient was informed that she  may incur a bill ( including co-pay) for this virtual visit encounter. Karina [name]  expressed understanding and gave verbal consent to proceed with virtual visit.     History of Present Illness:Karina [name]  is a 34 y.o. female presents for evaluation of nonproductive cough, occasional mild shortness of breath with activity, and chills x 2 weeks.  She tested COVID positive on 01/18/2019.  She is treating her symptoms at home with Dayquil/Nyquil.  She denies fever, sore throat, vomiting, diarrhea, rash, or other symptoms.      Allergies  Allergen Reactions  . Lisinopril Cough     Past Medical History:  Diagnosis Date  . Diabetes mellitus    Type 2, insulin resistant  . Syncope 04/08/2015     Social History   Tobacco Use  . Smoking status: Never Smoker  . Smokeless tobacco: Never Used  Substance Use Topics  . Alcohol use: No  . Drug use: No        Observations/Objective: Physical Exam  VITALS: Patient denies fever. GENERAL: Alert, appears well and in no acute distress. HEENT: Atraumatic. NECK: Normal movements of the head and neck. CARDIOPULMONARY: No increased WOB. Speaking in clear sentences. I:E ratio WNL.  MS: Moves all visible extremities without noticeable abnormality. PSYCH: Pleasant and cooperative,  well-groomed. Speech normal rate and rhythm. Affect is appropriate. Insight and judgement are appropriate. Attention is focused, linear, and appropriate.  NEURO: CN grossly intact. Oriented as arrived to appointment on time with no prompting. Moves both UE equally.  SKIN: No obvious lesions, wounds, erythema, or cyanosis noted on face or hands.   Assessment and Plan:    ICD-10-CM   1. Cough  R05        Follow Up Instructions: Treating Tessalon Perles and albuterol inhaler.  Instructed patient to follow-up with her PCP or come here to be seen in person if her symptoms are not improving.  Patient agrees to plan of care.      I discussed the assessment and treatment plan with the patient. The patient was provided an opportunity to ask questions and all were answered. The patient agreed with the plan and demonstrated an understanding of the instructions.   The patient was advised to call back or seek an in-person evaluation if the symptoms worsen or if the condition fails to improve as anticipated.      Mickie Bail, NP  02/15/2019 10:23 AM         Mickie Bail, NP 02/15/19 1023

## 2019-02-16 ENCOUNTER — Encounter: Payer: Self-pay | Admitting: Emergency Medicine

## 2019-02-16 ENCOUNTER — Ambulatory Visit (INDEPENDENT_AMBULATORY_CARE_PROVIDER_SITE_OTHER): Payer: BC Managed Care – PPO

## 2019-02-16 ENCOUNTER — Other Ambulatory Visit: Payer: Self-pay

## 2019-02-16 ENCOUNTER — Ambulatory Visit
Admission: EM | Admit: 2019-02-16 | Discharge: 2019-02-16 | Disposition: A | Discharge status: Home / Self Care | Payer: BC Managed Care – PPO | Attending: Physician Assistant | Admitting: Physician Assistant

## 2019-02-16 ENCOUNTER — Telehealth: Payer: Self-pay | Admitting: Family Medicine

## 2019-02-16 DIAGNOSIS — R600 Localized edema: Secondary | ICD-10-CM | POA: Diagnosis not present

## 2019-02-16 DIAGNOSIS — R0789 Other chest pain: Secondary | ICD-10-CM

## 2019-02-16 DIAGNOSIS — R0602 Shortness of breath: Secondary | ICD-10-CM

## 2019-02-16 MED ORDER — METHOCARBAMOL 500 MG PO TABS: 500.0000 mg | ORAL_TABLET | Freq: Two times a day (BID) | ORAL | 0 refills | Status: DC

## 2019-02-16 NOTE — Discharge Instructions (Addendum)
No alarming signs at this time. Your EKG was normal. Blood pressure is better. Chest xray was negative for infection, fluid build up.  Blood pressure can be elevated from being sick, or with taking cold medicine. Can also increase when you are in pain. At this time, continue to monitor, and document your blood pressure for your PCP to evaluate. Keep hydrated, urine should be clear to pale yellow in color. Please follow up with with your PCP next week for recheck, and lab work.   If you develop worsening chest pain/shortness of breath and blood pressure remains in the 200s, go to the emergency department for further evaluation. If having sudden weakness, dizziness, confusion, passing out, go to the emergency department for  further evaluation.

## 2019-02-16 NOTE — ED Notes (Signed)
Patient able to ambulate independently  

## 2019-02-16 NOTE — Telephone Encounter (Signed)
Received call to after hours emergency page:  Patient complaining of headache.  Most relevant medical history includes uncontrolled diabetes on insulin as well as asthma and Covid positive on January 7 (almost a month ago).  Patient states she feels like her blood sugars were high and she was assuming this was the cause of her headache so she took some extra insulin because she was not at her house.  When she got home just recently she checked her blood sugars and they were 260.  She states that she does have a severe headache for the last 10 or 12 hours.  Of note she did have a telephone urgent care visit yesterday for cough which she says has resolved.  She checked her blood pressure and says that on 2 separate readings systolic was over 200.  200/139, 205/138.  Patient is speaking in full sentences and is not coughing on exam.  Does not appear to be in respiratory distress over the phone  I discussed emergency requiring issues with blood pressures as high with symptoms including kidney damage, visual changes, chest pain, trouble breathing.  That headache was not initially a criteria for hypertensive emergency, but that without an evaluation and some testing it would be difficult to verify that she was not in a hypertensive emergency.  Patient was advised to go immediately to an urgent care and get evaluated there and that if she could not get to an urgent care that she should probably go to an emergency department so they can verify that there is no organ damage with her blood pressures this high.  Patient understands  -Dr. Parke Simmers

## 2019-02-16 NOTE — ED Provider Notes (Signed)
[facility]    CSN: [id number] Arrival date & time: 02/16/19  1946      History   Chief Complaint Chief Complaint  Patient presents with  . Hypertension    HPI Karina [name] is a 34 y.o. female.   34 year old female comes in for evaluation of chest pain that occurred last night for approx 2 mins, and today approx 4 mins. Also had a headache and checked BP, 205/138. Called PCP emergent service, who suggested evaluation at urgent care.  Denies chest pain at this time. Chest pain was to the mid chest, aching in sensation. Denies any obvious aggravating or alleviating factor. Denies obvious triggers. She was diagnosed with COVID 01/18/2019 and has residual shortness of breath, cough, for which she was evaluated via video visit yesterday. She was given albuterol, but has not started using. Denies worsening of shortness of breath. Has had nausea with the headache without vomiting. States has had leg swelling to the ankle at baseline. Has noticed increased leg swelling to the shins in the past month. She uses 6 pillows to sleep at night at baseline, denies increase use. Has history of GERD, not currently on medication. cbg 242 about 30 mins ago. Was taking dayquil/nyquil for cough, unsure if decongestants present. Also moved boxes yesterday that was around 70 pounds.      Past Medical History:  Diagnosis Date  . Diabetes mellitus    Type 2, insulin resistant  . Syncope 04/08/2015    Patient Active Problem List   Diagnosis Date Noted  . COVID-19 01/24/2019  . Back pain 12/12/2017  . Lower extremity edema 08/29/2017  . 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 Essential hypertension, benign 12/12/2013  . Morbid obesity (HCC) 05/25/2013  . Moderate nonproliferative diabetic retinopathy (HCC) 05/24/2013  . Diabetic neuropathy, type II diabetes mellitus (HCC) 05/23/2013  . 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 Depression 12/27/2012    Past Surgical History:  Procedure Laterality Date  . CERVICAL BIOPSY  2004  . CESAREAN SECTION N/A 06/02/2012   Procedure: CESAREAN SECTION;  Surgeon: Catalina Antigua, MD;  Location: [facility];  Service: Obstetrics;  Laterality: N/A;    OB History    Gravida  1   Para  1   Term  1   Preterm      AB      Living  1     SAB      TAB      Ectopic      Multiple      Live Births  1            Home Medications    Prior to Admission medications   Medication Sig Start Date End Date Taking? Authorizing Provider  albuterol (VENTOLIN HFA) 108 (90 Base) MCG/ACT inhaler Inhale 2 puffs into the lungs every 4 (four) hours as needed for wheezing or shortness of breath. 02/15/19  Yes Mickie Bail, NP  benzonatate (TESSALON) 100 MG capsule Take 1 capsule (100 mg total) by mouth every 8 (eight) hours. 02/15/19  Yes Mickie Bail, NP  Cholecalciferol 50000 units capsule Take 1 capsule (50,000 Units total) by mouth every 7 (seven) days. Patient not taking: Reported on 03/27/2018 09/03/16   Beaulah Dinning, MD  Continuous Blood Gluc Sensor (FREESTYLE LIBRE 14 DAY SENSOR) MISC 1 each by Does not apply route every 14 (fourteen) days.  Change every 2 weeks Patient not taking: Reported on 10/27/2018 05/30/18   Carlus Pavlov, MD  Insulin Degludec (TRESIBA FLEXTOUCH) 200 UNIT/ML SOPN Inject 50 Units into the skin daily. 07/25/18   Carlus Pavlov, MD  insulin lispro (HUMALOG KWIKPEN) 100 UNIT/ML KwikPen Inject 0.15-0.2 mLs (15-20 Units total) into the skin 3 (three) times daily. 08/09/18   Romero Belling, MD  Insulin Pen Needle (B-D ULTRAFINE III SHORT PEN) 31G X 8 MM MISC 1 Container by Does not apply route as needed. 05/16/17   Casey Burkitt, MD  Insulin Syringe-Needle U-100 [place] INSULIN SYRINGE) 31G X 5/16" 1 ML MISC Use to inject insulin. 08/16/16   Beaulah Dinning, MD  methocarbamol (ROBAXIN) 500 MG tablet  Take 1 tablet (500 mg total) by mouth 2 (two) times daily. 02/16/19   Cathie Hoops, Karenann Mcgrory V, PA-C  fluticasone (FLONASE) 50 MCG/ACT nasal spray Place 2 sprays into both nostrils daily. 05/29/18 01/31/19  Lennox Solders, MD  gabapentin (NEURONTIN) 100 MG capsule Take 1 capsule (100 mg total) by mouth at bedtime. 08/25/18 01/31/19  Lennox Solders, MD  losartan (COZAAR) 25 MG tablet Take 1 tablet (25 mg total) by mouth daily. 08/25/18 01/31/19  Lennox Solders, MD    Family History Family History  Problem Relation Age of Onset  . Diabetes Father     Social History Social History   Tobacco Use  . Smoking status: Never Smoker  . Smokeless tobacco: Never Used  Substance Use Topics  . Alcohol use: No  . Drug use: No     Allergies   Lisinopril   Review of Systems Review of Systems  Reason unable to perform ROS: See HPI as above.     Physical Exam Triage Vital Signs ED Triage Vitals  Enc Vitals Group     BP      Pulse      Resp      Temp      Temp src      SpO2      Weight      Height      Head Circumference      Peak Flow      Pain Score      Pain Loc      Pain Edu?      Excl. in GC?    No data found.  Updated Vital Signs BP (!) 169/114 (BP Location: Right Arm)   Pulse 98   Temp 97.6 F (36.4 C) (Temporal)   Resp 18   LMP 02/07/2019   SpO2 96%   Physical Exam Constitutional:      General: She is not in acute distress.    Appearance: Normal appearance. She is not ill-appearing, toxic-appearing or diaphoretic.  HENT:     Head: Normocephalic and atraumatic.     Mouth/Throat:     Mouth: Mucous membranes are moist.     Pharynx: Oropharynx is clear. Uvula midline.  Eyes:     Extraocular Movements: Extraocular movements intact.     Conjunctiva/sclera: Conjunctivae normal.     Pupils: Pupils are equal, round, and reactive to light.  Cardiovascular:     Rate and Rhythm: Normal rate and regular rhythm.     Heart sounds: Normal heart sounds. No murmur. No friction rub.  No gallop.   Pulmonary:     Effort: Pulmonary effort is normal. No accessory muscle usage, prolonged expiration, respiratory distress or retractions.     Comments: Lungs clear to auscultation without adventitious lung sounds.  Chest:     Comments: Diffuse tenderness to palpation of the chest.  Musculoskeletal:     Cervical back: Normal range of motion and neck supple.     Comments: 2+ pitting edema bilaterally to below the knee. No erythema, warmth. No tenderness to palpation.   Skin:    General: Skin is warm and dry.  Neurological:     General: No focal deficit present.     Mental Status: She is alert and oriented to person, place, and time.      UC Treatments / Results  Labs (all labs ordered are listed, but only abnormal results are displayed) Labs Reviewed - No data to display  EKG   Radiology DG Chest 2 View  Result Date: 02/16/2019 CLINICAL DATA:  34 year old female with shortness of breath. EXAM: CHEST - 2 VIEW COMPARISON:  Chest radiograph dated 01/31/2019. FINDINGS: The heart size and mediastinal contours are within normal limits. Both lungs are clear. The visualized skeletal structures are unremarkable. IMPRESSION: No active cardiopulmonary disease. Electronically Signed   By: Anner Crete M.D.   On: 02/16/2019 20:30    Procedures Procedures (including critical care time)  Medications Ordered in UC Medications - No data to display  Initial Impression / Assessment and Plan / UC Course  I have reviewed the triage vital signs and the nursing notes.  Pertinent labs & imaging results that were available during my care of the patient were reviewed by me and considered in my medical decision making (see chart for details).    BP 169/114. Without current chest pain. Does endorse continued shortness of breath since COVID, though without significant worsening. 2+ pitting edema to the knee bilaterally. Given recent COVID infection, will obtain EKG, CXR for further  evaluation.  EKG NSR, 97bpm, no significant ST changes, unchanged from prior.   Patient with shortness of breath since COVID without significantly worsening. No tachycardia, hypoxia, lower suspicion for PE at this time. Chest pain is reproducible by palpation. CXR negative for active cardiopulmonary disease. With stable EKG, low suspicion for ACS. Discussed to continue to monitor BP, stay hydrated, and to follow up with PCP for further evaluation and management needed. Offered treatment for headache, for which patient declined at this time. Will provide robaxin for chest wall pain. Ace wrap for leg swelling. Return precautions given. Otherwise, patient to follow up with PCP next week for recheck. Patient expresses understanding and agrees to plan.   Final Clinical Impressions(s) / UC Diagnoses   Final diagnoses:  Chest wall pain  Shortness of breath  Bilateral leg edema   ED Prescriptions    Medication Sig Dispense Auth. Provider   methocarbamol (ROBAXIN) 500 MG tablet Take 1 tablet (500 mg total) by mouth 2 (two) times daily. 14 tablet Ok Edwards, PA-C     PDMP not reviewed this encounter.   Ok Edwards, PA-C 02/17/19 [phone]

## 2019-02-16 NOTE — ED Triage Notes (Signed)
Pt presents to [facility] for assessment after having chest pain last night for approx 2 minutes, and today for about 4 minutes.  Denies at this time.  C/o headache and checked BP at home, machine read 205/138.

## 2019-02-21 DIAGNOSIS — R809 Proteinuria, unspecified: Secondary | ICD-10-CM | POA: Diagnosis not present

## 2019-02-21 DIAGNOSIS — E1122 Type 2 diabetes mellitus with diabetic chronic kidney disease: Secondary | ICD-10-CM | POA: Diagnosis not present

## 2019-02-21 DIAGNOSIS — N1831 Chronic kidney disease, stage 3a: Secondary | ICD-10-CM | POA: Diagnosis not present

## 2019-02-26 ENCOUNTER — Other Ambulatory Visit: Payer: Self-pay | Admitting: Endocrinology

## 2019-02-26 DIAGNOSIS — E1165 Type 2 diabetes mellitus with hyperglycemia: Secondary | ICD-10-CM

## 2019-02-26 DIAGNOSIS — IMO0002 Reserved for concepts with insufficient information to code with codable children: Secondary | ICD-10-CM

## 2019-02-28 ENCOUNTER — Telehealth: Payer: Self-pay | Admitting: Family Medicine

## 2019-02-28 NOTE — Telephone Encounter (Signed)
Please let Karina [name] know that she needs to make an appointment to see me so that I can accurately assess her and fill out her FMLA forms.  I see that she has had multiple visits to the emergency department in urgent care and we have several issues to discuss.  Thank you.

## 2019-03-01 ENCOUNTER — Telehealth: Payer: Self-pay | Admitting: Internal Medicine

## 2019-03-01 ENCOUNTER — Other Ambulatory Visit: Payer: Self-pay | Admitting: Endocrinology

## 2019-03-01 ENCOUNTER — Other Ambulatory Visit: Payer: Self-pay | Admitting: Internal Medicine

## 2019-03-01 DIAGNOSIS — IMO0002 Reserved for concepts with insufficient information to code with codable children: Secondary | ICD-10-CM

## 2019-03-01 DIAGNOSIS — E1165 Type 2 diabetes mellitus with hyperglycemia: Secondary | ICD-10-CM

## 2019-03-01 MED ORDER — INSULIN LISPRO (1 UNIT DIAL) 100 UNIT/ML (KWIKPEN): 20.0000 [IU] | PEN_INJECTOR | Freq: Three times a day (TID) | SUBCUTANEOUS | 3 refills | Status: DC

## 2019-03-01 NOTE — Telephone Encounter (Signed)
Received a call from the team health nurse Lauren about pt had ran out of humalog prescription and refill request has not been approved.   BG this morning was 322 and at the time of the call was 425.    I have attempted to contact the pt on the listed number on the chart without success at [phone]    I have left her a message notifying her of the refill being sent to [facility] and to take it as prescribed by original endocrinologist    Pt was asymptomatic at the time of her call with nurse Lauren.     Abby Raelyn Mora, MD  [facility] Endocrinology  [facility] [address] [place], [place] [address] Phone: [phone] FAX: [phone]

## 2019-03-02 ENCOUNTER — Ambulatory Visit (HOSPITAL_COMMUNITY)
Admission: EM | Admit: 2019-03-02 | Discharge: 2019-03-02 | Disposition: A | Discharge status: Home / Self Care | Payer: BC Managed Care – PPO | Attending: Family Medicine | Admitting: Family Medicine

## 2019-03-02 ENCOUNTER — Encounter: Payer: Self-pay | Admitting: Family Medicine

## 2019-03-02 ENCOUNTER — Encounter (HOSPITAL_COMMUNITY): Payer: Self-pay

## 2019-03-02 ENCOUNTER — Other Ambulatory Visit: Payer: Self-pay

## 2019-03-02 ENCOUNTER — Ambulatory Visit (INDEPENDENT_AMBULATORY_CARE_PROVIDER_SITE_OTHER)
Admission: RE | Admit: 2019-03-02 | Discharge: 2019-03-02 | Disposition: A | Discharge status: Home / Self Care | Payer: BC Managed Care – PPO | Source: Ambulatory Visit

## 2019-03-02 DIAGNOSIS — M791 Myalgia, unspecified site: Secondary | ICD-10-CM

## 2019-03-02 DIAGNOSIS — R05 Cough: Secondary | ICD-10-CM

## 2019-03-02 DIAGNOSIS — R0789 Other chest pain: Secondary | ICD-10-CM | POA: Diagnosis not present

## 2019-03-02 DIAGNOSIS — U071 COVID-19: Secondary | ICD-10-CM

## 2019-03-02 DIAGNOSIS — R0602 Shortness of breath: Secondary | ICD-10-CM | POA: Diagnosis not present

## 2019-03-02 DIAGNOSIS — R030 Elevated blood-pressure reading, without diagnosis of hypertension: Secondary | ICD-10-CM

## 2019-03-02 DIAGNOSIS — Z8616 Personal history of COVID-19: Secondary | ICD-10-CM | POA: Diagnosis not present

## 2019-03-02 DIAGNOSIS — R059 Cough, unspecified: Secondary | ICD-10-CM

## 2019-03-02 MED ORDER — BENZONATATE 200 MG PO CAPS: 200.0000 mg | ORAL_CAPSULE | Freq: Two times a day (BID) | ORAL | 0 refills | Status: DC | PRN

## 2019-03-02 MED ORDER — IBUPROFEN 600 MG PO TABS: 600.0000 mg | ORAL_TABLET | Freq: Three times a day (TID) | ORAL | 0 refills | Status: DC

## 2019-03-02 MED ORDER — AZITHROMYCIN 250 MG PO TABS: ORAL_TABLET | ORAL | 0 refills | Status: DC

## 2019-03-02 NOTE — Discharge Instructions (Signed)
Based on worsening symptoms of burning in chest, SOB, and chest pressure with COVID diagnosis on 01/18/19 recommending further evaluation and management in person.  Instructed patient to go to Urgent Care or ED for further evaluation.  Informed patient to call 911 and go to the ED if she did not feel comfortable driving self to Urgent Care or ED.  Patient aware and in agreement with plan.

## 2019-03-02 NOTE — ED Provider Notes (Signed)
Karina [name] Virtual Visit via Video Note:  Karina [name]  initiated request for Telemedicine visit with [facility] team. I connected with Karina [name]  on 03/02/2019 at 9:10 AM  for a synchronized telemedicine visit using a video enabled HIPPA compliant telemedicine application. I verified that I am speaking with Karina [name]  using two identifiers. [place], Karina [name]  was physically located in a [facility] Urgent care site and Karina [name] was located at a different location.   The limitations of evaluation and management by telemedicine as well as the availability of in-person appointments were discussed. Patient was informed that she  may incur a bill ( including co-pay) for this virtual visit encounter. Karina [name]  expressed understanding and gave verbal consent to proceed with virtual visit.   [id number] 03/02/19 Arrival Time: 7253  CC: COVID symptoms  SUBJECTIVE: History from: patient.  Karina [name] is a 34 y.o. female who presents with body aches, burning in chest, dry cough, and low grade fever x 2 day.  Denies known sick exposure to COVID, flu or strep. However, friend came over the other day with cold symptoms.  Has tried tessalon perles with relief.  Symptoms are made worse at night.  Reports previous COVID infection dx'ed on 01/18/19.   Was seen at [facility] on 02/16/19 had stable EKG and negative CXR at that time.  Treated with robaxin and ace bandages for LEs.  Also complains of fatigue, sore throat, mild SOB, and chest pressure.  Denies chills, sinus pain, rhinorrhea,  wheezing, chest pain, nausea, vomiting, changes in bowel or bladder habits.    ROS: As per HPI.  All other pertinent ROS negative.     Past Medical History:  Diagnosis Date  . Diabetes mellitus    Type 2, insulin resistant  . Syncope 04/08/2015   Past Surgical History:  Procedure Laterality Date  . CERVICAL BIOPSY  2004  . CESAREAN SECTION N/A 06/02/2012   Procedure:  CESAREAN SECTION;  Surgeon: Mora Bellman, MD;  Location: [facility];  Service: Obstetrics;  Laterality: N/A;   Allergies  Allergen Reactions  . Lisinopril Cough   No current facility-administered medications on file prior to encounter.   Current Outpatient Medications on File Prior to Encounter  Medication Sig Dispense Refill  . albuterol (VENTOLIN HFA) 108 (90 Base) MCG/ACT inhaler Inhale 2 puffs into the lungs every 4 (four) hours as needed for wheezing or shortness of breath. 18 g 0  . benzonatate (TESSALON) 100 MG capsule Take 1 capsule (100 mg total) by mouth every 8 (eight) hours. 21 capsule 0  . Insulin Degludec (TRESIBA FLEXTOUCH) 200 UNIT/ML SOPN Inject 50 Units into the skin daily. 3 pen 5  . insulin lispro (HUMALOG KWIKPEN) 100 UNIT/ML KwikPen Inject 0.2 mLs (20 Units total) into the skin 3 (three) times daily. 15 mL 3  . Insulin Pen Needle (B-D ULTRAFINE III SHORT PEN) 31G X 8 MM MISC 1 Container by Does not apply route as needed. 1 each 11  . Insulin Syringe-Needle U-100 (ULTICARE INSULIN SYRINGE) 31G X 5/16" 1 ML MISC Use to inject insulin. 200 each 1  . methocarbamol (ROBAXIN) 500 MG tablet Take 1 tablet (500 mg total) by mouth 2 (two) times daily. 14 tablet 0  . [DISCONTINUED] fluticasone (FLONASE) 50 MCG/ACT nasal spray Place 2 sprays into both nostrils daily. 16 g 0  . [DISCONTINUED] gabapentin (NEURONTIN) 100 MG capsule Take 1 capsule (100 mg total) by mouth at  bedtime. 90 capsule 3  . [DISCONTINUED] losartan (COZAAR) 25 MG tablet Take 1 tablet (25 mg total) by mouth daily. 90 tablet 3    OBJECTIVE:   There were no vitals filed for this visit.  General appearance: alert; appears fatigued Eyes: EOMI grossly HENT: normocephalic; atraumatic Neck: supple with FROM Lungs: normal respiratory effort; speaking in full sentences without difficulty Extremities: moves extremities without difficulty Skin: No obvious rashes Neurologic: No facial asymmetries Psychological: alert  and cooperative; normal mood and affect  ASSESSMENT & PLAN:  1. History of COVID-19   2. Shortness of breath   3. Chest pressure   4. Burning in the chest     No orders of the defined types were placed in this encounter.   Based on worsening symptoms of burning in chest, SOB, and chest pressure with COVID diagnosis on 01/18/19 recommending further evaluation and management in person.  Instructed patient to go to Urgent Care or ED for further evaluation.  Informed patient to call 911 and go to the ED if she did not feel comfortable driving self to Urgent Care or ED.  Patient aware and in agreement with plan.    I provided 12 minutes of non-face-to-face time during this encounter.  [place], Karina [name]  03/02/2019 9:10 AM          Rennis Harding, Karina [name] 03/02/19 [phone]

## 2019-03-02 NOTE — Telephone Encounter (Signed)
Pt has appointment scheduled on 03/12/19 with Dr. Frances Furbish.Reynolds Kittel Zimmerman Rumple, CMA

## 2019-03-02 NOTE — ED Notes (Signed)
Patient maintained 98% O2 while ambulating.  Dr. Delton See notified.

## 2019-03-02 NOTE — Telephone Encounter (Signed)
LVM for a return call to schedule an appt with Dr. Frances Furbish. If pt calls, please schedule pt an appt. Sunday Spillers, CMA

## 2019-03-02 NOTE — ED Provider Notes (Addendum)
[place]    CSN: [id number] Arrival date & time: 03/02/19  1339      History   Chief Complaint Chief Complaint  Patient presents with  . Cough    HPI Karina [name] is a 34 y.o. female.   HPI   Patient was diagnosed with coronavirus on 01/18/2019.  At that point she had had 2 days of body aches cough and chest discomfort and significant fatigue.  She had a video visit that requested she come in for medical evaluation.  At the medical evaluation she had Covid testing that was positive.  She was sent home with intermittent care instructions.  Because of continued symptoms she had another video visit on 01/22/2019.  She had continued home care.  On 01/31/2019 she had another televisit for increasing shortness of breath.  It was recommended that she return for additional medical evaluation.  She was seen here at the urgent care center for her shortness of breath.  Chest x-ray was done and was normal.m she had another video visit on 02/15/2019.  This was followed by another urgent care visit on 02/16/2019.  Chest x-ray was repeated on this date and was again normal.  She continued to recover at home.  She had another video visit today regarding her ongoing Covid symptoms of chest pain, chest wall pain, and fatigue.  It was advised that she come in for evaluation.  I am seeing her today for her Ortho urgent care visit since her diagnosis of Covid, in addition to the 3 video visits for medical advice. She states that her appetite is good.  She is continuing to eat.  She is drinking plenty of fluids.  She is still using Tessalon for cough.  This does not give her absolute relief from the coughing, she still has bad coughing spells.  Her chest hurts from the coughing.  It hurts to take deep breath.  Hurts to press on her chest.  It hurts in both of her arms.  No headache or body aches.  She states she did feel like she might have chills this morning.  She did not take her temperature. She has  insulin-dependent diabetes.  She ran out of her insulin.  Her diabetes has been poorly controlled.  Her hemoglobin A1c is over 14.  Her sugars recently have been in the 3 and 400 range.  I discussed with her that optimally people to continue to have shortness of breath with Covid or treat with a course of steroids.  This is not advisable with her uncontrolled diabetes.   Past Medical History:  Diagnosis Date  . Diabetes mellitus    Type 2, insulin resistant  . Syncope 04/08/2015    Patient Active Problem List   Diagnosis Date Noted  . COVID-19 01/24/2019  . Back pain 12/12/2017  . Lower extremity edema 08/29/2017  . 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 Essential hypertension, benign 12/12/2013  . Morbid obesity ([place]) 05/25/2013  . 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 Diabetic neuropathy, type II diabetes mellitus ([place]) 05/23/2013  . 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 Depression 12/27/2012    Past Surgical History:  Procedure Laterality Date  . CERVICAL BIOPSY  2004  . CESAREAN SECTION N/A 06/02/2012   Procedure: CESAREAN SECTION;  Surgeon: Mora Bellman, MD;  Location: [facility];  Service: Obstetrics;  Laterality: N/A;    OB  History    Gravida  1   Para  1   Term  1   Preterm      AB      Living  1     SAB      TAB      Ectopic      Multiple      Live Births  1            Home Medications    Prior to Admission medications   Medication Sig Start Date End Date Taking? Authorizing Provider  albuterol (VENTOLIN HFA) 108 (90 Base) MCG/ACT inhaler Inhale 2 puffs into the lungs every 4 (four) hours as needed for wheezing or shortness of breath. 02/15/19   Mickie Bail, NP  azithromycin (ZITHROMAX Z-PAK) 250 MG tablet Take two pills today followed by one a day until gone 03/02/19   Eustace Moore, MD  benzonatate  (TESSALON) 200 MG capsule Take 1 capsule (200 mg total) by mouth 2 (two) times daily as needed for cough. 03/02/19   Eustace Moore, MD  ibuprofen (ADVIL) 600 MG tablet Take 1 tablet (600 mg total) by mouth 3 (three) times daily. 03/02/19   Eustace Moore, MD  Insulin Degludec (TRESIBA FLEXTOUCH) 200 UNIT/ML SOPN Inject 50 Units into the skin daily. 07/25/18   Carlus Pavlov, MD  insulin lispro (HUMALOG KWIKPEN) 100 UNIT/ML KwikPen Inject 0.2 mLs (20 Units total) into the skin 3 (three) times daily. 03/01/19   Shamleffer, Konrad Dolores, MD  Insulin Pen Needle (B-D ULTRAFINE III SHORT PEN) 31G X 8 MM MISC 1 Container by Does not apply route as needed. 05/16/17   Casey Burkitt, MD  Insulin Syringe-Needle U-100 [place]ann [place] INSULIN SYRINGE) 31G X 5/16" 1 ML MISC Use to inject insulin. 08/16/16   Beaulah Dinning, MD  methocarbamol (ROBAXIN) 500 MG tablet Take 1 tablet (500 mg total) by mouth 2 (two) times daily. 02/16/19   Cathie Hoops, Amy V, PA-C  fluticasone (FLONASE) 50 MCG/ACT nasal spray Place 2 sprays into both nostrils daily. 05/29/18 01/31/19  Lennox Solders, MD  gabapentin (NEURONTIN) 100 MG capsule Take 1 capsule (100 mg total) by mouth at bedtime. 08/25/18 01/31/19  Lennox Solders, MD  losartan (COZAAR) 25 MG tablet Take 1 tablet (25 mg total) by mouth daily. 08/25/18 01/31/19  Lennox Solders, MD    Family History Family History  Problem Relation Age of Onset  . Diabetes Father   . Cancer Mother   . Diabetes Mother     Social History Social History   Tobacco Use  . Smoking status: Never Smoker  . Smokeless tobacco: Never Used  Substance Use Topics  . Alcohol use: No  . Drug use: No     Allergies   Lisinopril   Review of Systems Review of Systems  Constitutional: Positive for chills and fatigue. Negative for appetite change and fever.  HENT: Positive for congestion.   Respiratory: Positive for cough, chest tightness, shortness of breath and wheezing.     Cardiovascular: Positive for chest pain.  Gastrointestinal: Negative for diarrhea, nausea and vomiting.  Musculoskeletal: Positive for myalgias. Negative for back pain and neck pain.  Neurological: Positive for numbness. Negative for headaches.       Pain from neck into right arm with right arm numbness  Psychiatric/Behavioral: Positive for sleep disturbance.     Physical Exam Triage Vital Signs ED Triage Vitals  Enc Vitals Group     BP 03/02/19 1407 [name])  162/87     Pulse Rate 03/02/19 1407 97     Resp 03/02/19 1407 16     Temp 03/02/19 1407 98.3 F (36.8 C)     Temp Source 03/02/19 1407 Oral     SpO2 03/02/19 1407 100 %-stable with ambulation     Weight --      Height --      Head Circumference --      Peak Flow --      Pain Score 03/02/19 1406 6     Pain Loc --      Pain Edu? --      Excl. in GC? --    No data found.  Updated Vital Signs BP (!) 162/87 (BP Location: Left Arm)   Pulse 97   Temp 98.3 F (36.8 C) (Oral)   Resp 16   LMP 02/07/2019   SpO2 100%      Physical Exam Constitutional:      General: She is not in acute distress.    Appearance: She is well-developed. She is obese. She is ill-appearing.     Comments: Appears tired.  HENT:     Head: Normocephalic and atraumatic.     Mouth/Throat:     Mouth: Mucous membranes are moist.     Comments: Mask in place Eyes:     Conjunctiva/sclera: Conjunctivae normal.     Pupils: Pupils are equal, round, and reactive to light.  Cardiovascular:     Rate and Rhythm: Normal rate and regular rhythm.     Heart sounds: Normal heart sounds.  Pulmonary:     Effort: Pulmonary effort is normal. No respiratory distress.     Breath sounds: Normal breath sounds. No wheezing.  Musculoskeletal:        General: Normal range of motion.     Cervical back: Normal range of motion and neck supple.     Right lower leg: No edema.     Left lower leg: No edema.  Skin:    General: Skin is warm and dry.  Neurological:      General: No focal deficit present.     Mental Status: She is alert.  Psychiatric:        Mood and Affect: Mood normal.        Behavior: Behavior normal.      UC Treatments / Results  Labs (all labs ordered are listed, but only abnormal results are displayed) Labs Reviewed - No data to display  EKG   Radiology No results found.  Procedures Procedures (including critical care time)  Medications Ordered in UC Medications - No data to display  Initial Impression / Assessment and Plan / UC Course  I have reviewed the triage vital signs and the nursing notes.  Pertinent labs & imaging results that were available during my care of the patient were reviewed by me and considered in my medical decision making (see chart for details).     Patient has clear lungs.  I see no indication for chest x-ray. I discussed with her that an antibiotic is a reasonable trial. I am increasing her Tessalon from 100-200. Because she has chest wall pain and arm pain I am going to give her a limited number of ibuprofen.  Her kidney function has been normal in the past.  She is advised to take it with food.  She is told this will not be refilled, and is not a good ongoing medicine for diabetics. She is encouraged to follow a diabetic  diet, and use her insulin as prescribed  Final Clinical Impressions(s) / UC Diagnoses   Final diagnoses:  COVID-19 virus infection  Cough  Elevated blood pressure reading without diagnosis of hypertension     Discharge Instructions     Take ibuprofen 3 times a day with food as needed for chest pain Continue to drink plenty of fluids Take Tessalon 2 times a day for coughing.  May take 3 times a day if needed Take the Z-Pak as directed.  Take 2 today then 1 a day until gone See your PCP next week if not improving   ED Prescriptions    Medication Sig Dispense Auth. Provider   benzonatate (TESSALON) 200 MG capsule Take 1 capsule (200 mg total) by mouth 2 (two)  times daily as needed for cough. 20 capsule Eustace Moore, MD   azithromycin (ZITHROMAX Z-PAK) 250 MG tablet Take two pills today followed by one a day until gone 6 tablet Eustace Moore, MD   ibuprofen (ADVIL) 600 MG tablet Take 1 tablet (600 mg total) by mouth 3 (three) times daily. 21 tablet Eustace Moore, MD     PDMP not reviewed this encounter.   Eustace Moore, MD 03/02/19 1500    Eustace Moore, MD 03/02/19 1501

## 2019-03-02 NOTE — Discharge Instructions (Addendum)
Take ibuprofen 3 times a day with food as needed for chest pain Continue to drink plenty of fluids Take Tessalon 2 times a day for coughing.  May take 3 times a day if needed Take the Z-Pak as directed.  Take 2 today then 1 a day until gone See your PCP next week if not improving

## 2019-03-02 NOTE — ED Triage Notes (Signed)
Patient presents to Urgent Care with complaints of forceful cough since she tested positive for covid over a month ago. Patient reports she has also been having bilateral arm soreness.

## 2019-03-08 ENCOUNTER — Telehealth: Payer: Self-pay

## 2019-03-08 NOTE — Telephone Encounter (Signed)
Patient calls nurse line regarding FMLA paperwork. Per patient, FMLA paperwork was to reflect on going side effects of COVID, not diabetes. Patient reports that she is still having intermittent shortness of breath and coughing that she is still taking albuterol treatment and tessalon pearls for. Patient states that FMLA paperwork needs to be extended through March 25, 2019  Patient requests responses via myChart.   To PCP  Veronda Prude, RN

## 2019-03-12 ENCOUNTER — Encounter: Payer: Self-pay | Admitting: Family Medicine

## 2019-03-12 ENCOUNTER — Ambulatory Visit: Payer: BC Managed Care – PPO | Admitting: Family Medicine

## 2019-03-12 ENCOUNTER — Telehealth: Payer: Self-pay | Admitting: Family Medicine

## 2019-03-12 ENCOUNTER — Ambulatory Visit (INDEPENDENT_AMBULATORY_CARE_PROVIDER_SITE_OTHER): Payer: BC Managed Care – PPO | Admitting: Family Medicine

## 2019-03-12 ENCOUNTER — Other Ambulatory Visit: Payer: Self-pay

## 2019-03-12 ENCOUNTER — Telehealth: Payer: Self-pay | Admitting: Physician Assistant

## 2019-03-12 VITALS — BP 134/82 | HR 105 | Wt 244.2 lb

## 2019-03-12 DIAGNOSIS — F3289 Other specified depressive episodes: Secondary | ICD-10-CM

## 2019-03-12 DIAGNOSIS — M25511 Pain in right shoulder: Secondary | ICD-10-CM

## 2019-03-12 DIAGNOSIS — Z794 Long term (current) use of insulin: Secondary | ICD-10-CM

## 2019-03-12 DIAGNOSIS — E113313 Type 2 diabetes mellitus with moderate nonproliferative diabetic retinopathy with macular edema, bilateral: Secondary | ICD-10-CM

## 2019-03-12 DIAGNOSIS — Z8616 Personal history of COVID-19: Secondary | ICD-10-CM

## 2019-03-12 DIAGNOSIS — G5601 Carpal tunnel syndrome, right upper limb: Secondary | ICD-10-CM

## 2019-03-12 NOTE — Telephone Encounter (Signed)
Called to discuss with Karina [name] about Covid symptoms and the use of bamlanivimab or casirivimab/imdevimab, a monoclonal antibody infusion for those with mild to moderate Covid symptoms and at a high risk of hospitalization.     Pt does not qualify for infusion therapy as pt's symptoms first presented > 10 days prior to timing of infusion. Will discuss with Dr. Frances Furbish.    Patient Active Problem List   Diagnosis Date Noted  . COVID-19 01/24/2019  . Back pain 12/12/2017  . Lower extremity edema 08/29/2017  . 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 Essential hypertension, benign 12/12/2013  . Morbid obesity ([facility]) 05/25/2013  . Moderate nonproliferative diabetic retinopathy ([facility]) 05/24/2013  . Diabetic neuropathy, type II diabetes mellitus ([facility]) 05/23/2013  . 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 Depression 12/27/2012    Cline Crock PA-C

## 2019-03-12 NOTE — Telephone Encounter (Signed)
Left VM at [facility] referring Ms. Cumbee to them with the required information.

## 2019-03-12 NOTE — Telephone Encounter (Signed)
I have completed FMLA form with the patient's requested changes and placed it in the to be faxed pile.

## 2019-03-12 NOTE — Patient Instructions (Addendum)
It was nice seeing you today Karina [name]!  You have a rotator cuff injury on the right side, which will get better with time, but you can use Tylenol as well as a heating pad and ice for pain relief.  The most important thing is to strengthen your rotator cuff muscles, and I am including a handout to help you do this.  Please try to do these exercises every day ideally or at least multiple times per week.  You have carpal tunnel on your right hand as well.  Please buy a wrist brace that will keep your wrist from flexing and wear this either all the time or only at night.  This will hopefully get better in the next 1 to 2 months if you wear the wrist brace regularly.  I will look into monoclonal antibody therapy for your Covid symptoms.  You can use albuterol for shortness of breath.  I will also change your FMLA to reflect your needs.  Please follow-up within 1 month to discuss your mood.  If you have any questions or concerns, please feel free to call the clinic.   Be well,  Dr. Dairl P[facility] Ask your health care provider which exercises are safe for you. Do exercises exactly as told by your health care provider and adjust them as directed. It is normal to feel mild stretching, pulling, tightness, or discomfort as you do these exercises. Stop right away if you feel sudden pain or your pain gets worse. Do not begin these exercises until told by your health care provider. Stretching and range-of-motion exercises These exercises warm up your muscles and joints and improve the movement and flexibility of your shoulder. These exercises also help to relieve pain. Shoulder pendulum  1. Stand near a table or counter that you can hold onto for balance. 2. Bend forward at the waist and let your left / right arm hang straight down. Use your other arm to support you and help you stay balanced. 3. Relax your left / right arm and shoulder muscles, and move your hips and your  trunk so your left / right arm swings freely. Your arm should swing because of the motion of your body, not because you are using your arm or shoulder muscles. 4. Keep moving your hips and trunk so your arm swings in the following directions, as told by your health care provider: ? Side to side. ? Forward and backward. ? In clockwise and counterclockwise circles. 5. Slowly return to the starting position. Repeat __________ times, or for __________ seconds per direction. Complete this exercise __________ times a day. Shoulder flexion, seated In this exercise, you raise your arm in front of your body until you feel a stretch in your injured shoulder. 1. Sit in a stable chair so that your left / right forearm can rest on a flat surface. Your elbow should rest at shoulder height. 2. Keeping your left / right shoulder relaxed, lean forward at the waist and let your hand slide forward until you feel a stretch in your shoulder (flexion). 3. Hold for __________ seconds. 4. Slowly return to the starting position. Repeat __________ times. Complete this exercise __________ times a day. Strengthening exercises These exercises build strength and endurance in your shoulder. Endurance is the ability to use your muscles for a long time, even after they get tired. Shoulder extension, prone  1. Lie on your abdomen (prone position) on a firm surface so your left / right arm  hangs over the edge. 2. Hold a __________ weight in your left / right hand so your palm faces in toward your body. Your arm should be straight. 3. Squeeze your shoulder blade down toward the middle of your back. 4. Slowly raise your arm behind you, up to the height of the surface that you are lying on (extension). Keep your arm straight. 5. Hold for __________ seconds. 6. Slowly return to the starting position and relax your muscles. Repeat __________ times. Complete this exercise __________ times a day. Internal rotation, isometric This  is an exercise in which you press your palm against a door frame without moving your shoulder joint (isometric). 1. Stand or sit in a doorway, facing the door frame. 2. Bend your left / right elbow, and place the palm of your hand against the door frame so that only your hand is touching the frame. Keep your upper arm at your side. 3. Gently press your hand against the door frame, as if you are trying to push your arm toward your abdomen (internal rotation). Gradually increase the pressure until you are pressing as hard as you can. Stop increasing the pressure if you feel shoulder pain. ? Avoid shrugging your shoulder while you press your hand into the door frame. Keep your shoulder blade tucked down toward the middle of your back. 4. Hold for __________ seconds. 5. Slowly release the tension, and relax your muscles completely before you repeat the exercise. Repeat __________ times. Complete this exercise __________ times a day. External rotation 1. Lie down on your left / right side. 2. Place a small pillow or a rolled-up towel between your left / right upper arm and your body. 3. Bend your left / right elbow to a 90-degree angle (right angle) so your hand is palm-down on your abdomen. 4. Squeeze your shoulder blade back toward the middle of your back. 5. Keeping your upper arm against the pillow or towel, move (pivot) your forearm and your hand away from your abdomen and toward the ceiling. Movement away from your body is called external rotation. Keep your elbow bent at a 90-degree angle. 6. Hold this position for __________ seconds. 7. Slowly return to the starting position. Repeat __________ times. Complete this exercise __________ times a day. Scapular retraction  1. Sit in a stable chair without armrests, or stand up. 2. Secure an exercise band to a stable object in front of you so the band is at shoulder height. 3. Hold one end of the exercise band in each hand. 4. Squeeze your shoulder  blades (scapulae) together and move your elbows slightly behind you (retraction). Do not shrug your shoulders upward while you do this. 5. Hold this position for __________ seconds. 6. Slowly return to the starting position. Repeat __________ times. Complete this exercise __________ times a day. This information is not intended to replace advice given to you by your health care provider. Make sure you discuss any questions you have with your health care provider. Document Revised: 04/20/2018 Document Reviewed: 04/13/2018 Elsevier Patient Education  2020 [employer].

## 2019-03-12 NOTE — Progress Notes (Signed)
SUBJECTIVE:   CHIEF COMPLAINT / HPI:   Covid [place]ela Arm pain, chest pain worse after coughing Occurred after Covid diagnosis Shortness of breath is improving, able to perform daily activities easier Has not needed home oxygen Cough is more productive than it used to be, has caused chest soreness Cough will occur when she has talked a lot Does not use the albuterol inhaler Interested in monoclonal antibody treatment if possible  Arm pain R>L, although present bilaterally Feels like arm pain is like squeezing, pulsating Occurring since January No exacerbating factors ASA seems to help R hand can go completely numb and will tingle Works on the computer a lot Endorses R sided neck pain, but no injury there  PERTINENT  PMH / [place]: Uncontrolled type 2 diabetes, recent Covid diagnosis, morbid obesity, depression  OBJECTIVE:   BP 134/82   Pulse (!) 105   Wt 244 lb 3.2 oz (110.8 kg)   LMP 03/09/2019   SpO2 97%   BMI 44.66 kg/m   General: well appearing, appears stated age, pleasant, morbidly obese Cardiac: RRR, no MRG Respiratory: CTAB, no rhonchi, rales, or wheezing, normal work of breathing Extremities: Normal-appearing bilateral hands with normal ROM on flexion and extension of bilateral wrists, 5/5 grip strength and strength on flexion and extension.  Tinel's sign positive on the right, Phalen sign negative. Shoulder, right: No evidence of bony deformity, asymmetry, or muscle atrophy; Tenderness over long head of biceps (bicipital groove). TTP at [facility] joint. Full passive ROM, although tenderness is present.  Diminished active range of motion on elevation, abduction, internal rotation, full ROM on external rotation and adduction, Thumb to L2. Strength 5/5 throughout. No abnormal scapular function observed. Sensation intact. Peripheral pulses intact.  Special Tests:   - Empty can: POS   - Hawkins: NEG   - Neer test: POS   - Yergason's: NEG   - Speeds test: POS Psych:  appropriate mood and affect  Depression screen [facility] 2/9 03/13/2019 03/12/2019 08/25/2018  Decreased Interest 1 2 0  Down, Depressed, Hopeless 1 1 0  PHQ - 2 Score 2 3 0  Altered sleeping 3 - -  Tired, decreased energy 3 - -  Change in appetite 3 - -  Feeling bad or failure about yourself  2 - -  Trouble concentrating 0 - -  Moving slowly or fidgety/restless 1 - -  Suicidal thoughts 0 - -  PHQ-9 Score 14 - -  Difficult doing work/chores Somewhat difficult - -  Some recent data might be hidden   GAD 7 : Generalized Anxiety Score 03/13/2019  Nervous, Anxious, on Edge 1  Control/stop worrying 1  Worry too much - different things 1  Trouble relaxing 1  Restless 1  Easily annoyed or irritable 2  Afraid - awful might happen 0  Total GAD 7 Score 7  Anxiety Difficulty Somewhat difficult        ASSESSMENT/PLAN:   COVID-19 Patient appears to be slowly improving since her diagnosis in January.  She does continue to have cough and become winded with activity, but since the symptoms are improving, no concern for PE or pneumonia.  Patient counseled that improvement should continue to slowly occur, and she can use albuterol to help improve shortness of breath as needed.  Carpal tunnel syndrome of right wrist Patient's frequent typing, obesity, and type 2 diabetes make this diagnosis more likely, especially given positive Tinel's sign today.  Patient counseled that this condition responds well to a wrist brace keeping  her wrist out of flexion, which she should wear at the minimum each night but can wear all the time if she prefers.  Counseled that she will hopefully feel improvement after wearing this brace consistently for 4 to 8 weeks.  If she does not achieve improvement with this, we can consider other interventions such as corticosteroid injections or carpal tunnel release.  Acute pain of right shoulder Due to generalized tenderness on exam, patient may have rotator cuff injury or could have  referred pain from frequent coughing due to the path of the phrenic nerve from the cervical spine and to the diaphragm.  This should resolve with time if it is due to phrenic nerve irritation, but patient was also given rotator cuff strengthening exercises to see if this will help as well.  Patient was encouraged to use Tylenol, heating pad, and ice for relief as well.  Depression Discussed patient's GAD-7 and PHQ-9 with her today.  Patient will follow up within the month so that we can devote a whole visit to her depression and anxiety.     Lennox Solders, MD [facility]

## 2019-03-13 ENCOUNTER — Telehealth: Payer: Self-pay | Admitting: Nurse Practitioner

## 2019-03-13 DIAGNOSIS — M25511 Pain in right shoulder: Secondary | ICD-10-CM | POA: Insufficient documentation

## 2019-03-13 DIAGNOSIS — G5601 Carpal tunnel syndrome, right upper limb: Secondary | ICD-10-CM | POA: Insufficient documentation

## 2019-03-13 HISTORY — DX: Pain in right shoulder: M25.511

## 2019-03-13 HISTORY — DX: Carpal tunnel syndrome, right upper limb: G56.01

## 2019-03-13 NOTE — Telephone Encounter (Signed)
Called to Discuss with patient about Covid symptoms and the use of bamlanivimab, a monoclonal antibody infusion for those with mild to moderate Covid symptoms and at a high risk of hospitalization.     Pt is qualified for this infusion at the [facility] center due to co-morbid conditions and/or a member of an at-risk group.     Unable to reach pt  

## 2019-03-13 NOTE — Assessment & Plan Note (Signed)
Discussed patient's GAD-7 and PHQ-9 with her today.  Patient will follow up within the month so that we can devote a whole visit to her depression and anxiety.

## 2019-03-13 NOTE — Assessment & Plan Note (Signed)
Patient appears to be slowly improving since her diagnosis in January.  She does continue to have cough and become winded with activity, but since the symptoms are improving, no concern for PE or pneumonia.  Patient counseled that improvement should continue to slowly occur, and she can use albuterol to help improve shortness of breath as needed.

## 2019-03-13 NOTE — Assessment & Plan Note (Signed)
Due to generalized tenderness on exam, patient may have rotator cuff injury or could have referred pain from frequent coughing due to the path of the phrenic nerve from the cervical spine and to the diaphragm.  This should resolve with time if it is due to phrenic nerve irritation, but patient was also given rotator cuff strengthening exercises to see if this will help as well.  Patient was encouraged to use Tylenol, heating pad, and ice for relief as well.

## 2019-03-13 NOTE — Assessment & Plan Note (Signed)
Patient's frequent typing, obesity, and type 2 diabetes make this diagnosis more likely, especially given positive Tinel's sign today.  Patient counseled that this condition responds well to a wrist brace keeping her wrist out of flexion, which she should wear at the minimum each night but can wear all the time if she prefers.  Counseled that she will hopefully feel improvement after wearing this brace consistently for 4 to 8 weeks.  If she does not achieve improvement with this, we can consider other interventions such as corticosteroid injections or carpal tunnel release.

## 2019-03-21 ENCOUNTER — Ambulatory Visit (HOSPITAL_COMMUNITY)
Admission: RE | Admit: 2019-03-21 | Discharge: 2019-03-21 | Disposition: A | Discharge status: Home / Self Care | Payer: BC Managed Care – PPO | Source: Ambulatory Visit | Attending: Nurse Practitioner | Admitting: Nurse Practitioner

## 2019-03-21 ENCOUNTER — Ambulatory Visit (INDEPENDENT_AMBULATORY_CARE_PROVIDER_SITE_OTHER): Payer: BC Managed Care – PPO | Admitting: Nurse Practitioner

## 2019-03-21 ENCOUNTER — Other Ambulatory Visit: Payer: Self-pay

## 2019-03-21 VITALS — BP 118/86 | HR 96 | Temp 97.7°F | Ht 62.0 in | Wt 251.0 lb

## 2019-03-21 DIAGNOSIS — R05 Cough: Secondary | ICD-10-CM | POA: Insufficient documentation

## 2019-03-21 DIAGNOSIS — Z8616 Personal history of COVID-19: Secondary | ICD-10-CM | POA: Diagnosis not present

## 2019-03-21 DIAGNOSIS — R0602 Shortness of breath: Secondary | ICD-10-CM

## 2019-03-21 DIAGNOSIS — R059 Cough, unspecified: Secondary | ICD-10-CM

## 2019-03-21 MED ORDER — PREDNISONE 10 MG PO TABS: ORAL_TABLET | ORAL | 0 refills | Status: DC

## 2019-03-21 MED ORDER — FLUTICASONE PROPIONATE 50 MCG/ACT NA SUSP: 2.0000 | Freq: Every day | NASAL | 6 refills | Status: DC

## 2019-03-21 MED ORDER — BENZONATATE 200 MG PO CAPS: 200.0000 mg | ORAL_CAPSULE | Freq: Two times a day (BID) | ORAL | 0 refills | Status: DC | PRN

## 2019-03-21 MED ORDER — OMEPRAZOLE 20 MG PO CPDR: 20.0000 mg | DELAYED_RELEASE_CAPSULE | Freq: Every day | ORAL | 3 refills | Status: DC

## 2019-03-21 NOTE — Progress Notes (Signed)
$'[name]$  ID: Karina [name], female    DOB: 11/08/1985, 34 y.o.   MRN: [id number]  Chief Complaint  Patient presents with  . Post COVID    Tested Positive 1/7. Sx: Cough, shortness of breath, and headaches    Referring provider: Kathrene Alu, MD  34 year old female with history of diabetes, morbid obesity, and depression. Diagnosed with Covid in January.    Recent Significant Encounters:   Timeline of Illness:  01/18/19 - ED visit - Covid positive   01/31/19 - ED visit - Shortness of breath, - advised to follow up with PCP  02/15/19 - UC visit - Ongoing cough - Tessalon Perles and albuterol inhaler prescribed  02/16/19 ED visit - Chest pain - EKG - NSR - prescribed robaxin  03/02/19  UC tele-visit - Shortness of breath and chest pain - advised to go to ED  03/02/19 ED visit - Prescribed Z-pak, ibuprofen, tessalon perles  03/12/19 PCP follow up - diagnosed with carpal tunnel, right shoulder strain, depression. Noted to be slowly improving with Covid symptoms.    Imaging:   Chest Xray 02/16/19: No active cardiopulmonary disease.  Labs:  POC SARS Coronavirus 01/18/19: positive   HPI  Patient presents today for post covid care visit. Please see illness timeline above. Patient has had multiple ED/urgent care visits since diagnosed with Covid in January. She states that she still has ongoing cough, headache, and shortness of breath. She states that her cough is productive of clear sputum at times. She complains of ongoing fatigue. She states that she gets short of breath and fatigued while performing activities of daily living. Denies f/c/s, n/v/d, hemoptysis, PND.  Note: patient was walked in office today and O2 sats remained above 96% for entire walk. Patient was noted to get get short of breath and fatigued after just one short lap.        Allergies  Allergen Reactions  . Lisinopril Cough    Immunization History  Administered Date(s) Administered  . Influenza Split  12/20/2011  . Influenza,inj,Quad PF,6+ Mos 11/10/2015  . MMR 06/03/2012  . Tdap 03/13/2012    Past Medical History:  Diagnosis Date  . Diabetes mellitus    Type 2, insulin resistant  . Syncope 04/08/2015    Tobacco History: Social History   Tobacco Use  Smoking Status Never Smoker  Smokeless Tobacco Never Used   Counseling given: Not Answered   Outpatient Encounter Medications as of 03/21/2019  Medication Sig  . albuterol (VENTOLIN HFA) 108 (90 Base) MCG/ACT inhaler Inhale 2 puffs into the lungs every 4 (four) hours as needed for wheezing or shortness of breath.  . benzonatate (TESSALON) 200 MG capsule Take 1 capsule (200 mg total) by mouth 2 (two) times daily as needed for cough.  . Insulin Degludec (TRESIBA FLEXTOUCH) 200 UNIT/ML SOPN Inject 50 Units into the skin daily.  . insulin lispro (HUMALOG KWIKPEN) 100 UNIT/ML KwikPen Inject 0.2 mLs (20 Units total) into the skin 3 (three) times daily.  . Insulin Pen Needle (B-D ULTRAFINE III SHORT PEN) 31G X 8 MM MISC 1 Container by Does not apply route as needed.  . Insulin Syringe-Needle U-100 (ULTICARE INSULIN SYRINGE) 31G X 5/16" 1 ML MISC Use to inject insulin.  . methocarbamol (ROBAXIN) 500 MG tablet Take 1 tablet (500 mg total) by mouth 2 (two) times daily.  . [DISCONTINUED] benzonatate (TESSALON) 200 MG capsule Take 1 capsule (200 mg total) by mouth 2 (two) times daily as needed for cough.  [name]  fluticasone (FLONASE) 50 MCG/ACT nasal spray Place 2 sprays into both nostrils daily.  [name] ibuprofen (ADVIL) 600 MG tablet Take 1 tablet (600 mg total) by mouth 3 (three) times daily. (Patient not taking: Reported on 03/21/2019)  . omeprazole (PRILOSEC) 20 MG capsule Take 1 capsule (20 mg total) by mouth daily.  . predniSONE (DELTASONE) 10 MG tablet Take 4 tabs for 2 days, then 3 tabs for 2 days, then 2 tabs for 2 days, then 1 tab for 2 days, then stop  . [DISCONTINUED] fluticasone (FLONASE) 50 MCG/ACT nasal spray Place 2 sprays into both  nostrils daily.  . [DISCONTINUED] gabapentin (NEURONTIN) 100 MG capsule Take 1 capsule (100 mg total) by mouth at bedtime.  . [DISCONTINUED] losartan (COZAAR) 25 MG tablet Take 1 tablet (25 mg total) by mouth daily.   No facility-administered encounter medications on file as of 03/21/2019.     Review of Systems  Review of Systems  Constitutional: Positive for activity change (decreased) and fatigue. Negative for chills and fever.  Respiratory: Positive for cough and shortness of breath. Negative for wheezing.   Cardiovascular: Negative for chest pain.  Psychiatric/Behavioral: Negative for decreased concentration.  All other systems reviewed and are negative.      Physical Exam  BP 118/86 (BP Location: Left Arm, Patient Position: Sitting)   Pulse 96   Temp 97.7 F (36.5 C)   Ht '5\' 2"'$  (1.575 m)   Wt 251 lb (113.9 kg)   LMP 03/09/2019   SpO2 97%   BMI 45.91 kg/m   Wt Readings from Last 5 Encounters:  03/21/19 251 lb (113.9 kg)  03/12/19 244 lb 3.2 oz (110.8 kg)  01/18/19 245 lb (111.1 kg)  10/27/18 232 lb (105.2 kg)  08/25/18 237 lb (107.5 kg)     Physical Exam Vitals and nursing note reviewed.  Constitutional:      General: She is not in acute distress.    Appearance: She is well-developed.  Cardiovascular:     Rate and Rhythm: Normal rate and regular rhythm.  Pulmonary:     Effort: Pulmonary effort is normal. No respiratory distress.     Breath sounds: Normal breath sounds. No wheezing or rhonchi.  Musculoskeletal:        General: Normal range of motion.     Right lower leg: No edema.     Left lower leg: No edema.  Neurological:     Mental Status: She is alert and oriented to person, place, and time.  Psychiatric:        Mood and Affect: Mood normal.      Imaging: DG Chest 2 View  Result Date: 03/21/2019 CLINICAL DATA:  Post COVID. Cough. Shortness of breath. COVID diagnosed January 18, 2019. Persistent shortness of breath. EXAM: CHEST - 2 VIEW  COMPARISON:  Radiograph 03/08/2019 FINDINGS: The cardiomediastinal contours are normal. The lungs are clear. Pulmonary vasculature is normal. No consolidation, pleural effusion, or pneumothorax. No acute osseous abnormalities are seen. IMPRESSION: Unremarkable radiographs of the chest. Electronically Signed   By: Keith Rake M.D.   On: 03/21/2019 15:07     Assessment & Plan:   History of COVID-19 Cough Shortness of Breath  Assessment: Patient continues to have ongoing cough and shortness of breath after being diagnosed with covid in january. She has been to the ED several times for this. Patient's lungs were clear upon exam in office today. Patient was noted to become short of breath and fatigued after just 1 lap around office today. O2  sats remained stable. Heart rate did increase and returned to normal with rest. Shortness of breath most likely due to physical deconditioning.   Plan: Will check chest x ray Will refer to pulmonary - May need PFT and pulmonary rehab Stay active Hand out given on post Covid nutritional rehabilitation Will order prednisone taper - please keep close watch on blood sugar May use inhaler as needed  Cough: Start gastroesophageal reflux disease treatment with elevating the head your bed and taking antacids Continue taking over-the-counter antihistamines and nasal fluticasone to help with allergic rhinitis You need to try to suppress your cough to allow your larynx (voice box) to heal.  For three days don't talk, laugh, sing, or clear your throat. Do everything you can to suppress the cough during this time. Use hard candies (sugarless Jolly Ranchers) or non-mint or non-menthol containing cough drops during this time to soothe your throat.  Use a cough suppressant (Delsym or tessalon perles) around the clock during this time.  After three days, gradually increase the use of your voice and back off on the cough suppressants.   Follow up in 2 months or sooner  if needed       Fenton Foy, NP 03/22/2019

## 2019-03-21 NOTE — Patient Instructions (Addendum)
Will check chest x ray Will refer to pulmonary - May need PFT and pulmonary rehab Stay active Hand out given on post Covid nutritional rehabilitation Will order prednisone taper - please keep close watch on blood sugar May use inhaler as needed  Cough: Start gastroesophageal reflux disease treatment with elevating the head your bed and taking antacids Continue taking over-the-counter antihistamines and nasal fluticasone to help with allergic rhinitis You need to try to suppress your cough to allow your larynx (voice box) to heal.  For three days don't talk, laugh, sing, or clear your throat. Do everything you can to suppress the cough during this time. Use hard candies (sugarless Jolly Ranchers) or non-mint or non-menthol containing cough drops during this time to soothe your throat.  Use a cough suppressant (Delsym or tessalon perles) around the clock during this time.  After three days, gradually increase the use of your voice and back off on the cough suppressants.   Follow up in 2 months or sooner if needed

## 2019-03-21 NOTE — Progress Notes (Signed)
Left voicemail for patient to return phone call regarding results.

## 2019-03-22 DIAGNOSIS — Z8616 Personal history of COVID-19: Secondary | ICD-10-CM

## 2019-03-22 DIAGNOSIS — R0602 Shortness of breath: Secondary | ICD-10-CM | POA: Insufficient documentation

## 2019-03-22 DIAGNOSIS — R058 Other specified cough: Secondary | ICD-10-CM | POA: Insufficient documentation

## 2019-03-22 DIAGNOSIS — R05 Cough: Secondary | ICD-10-CM | POA: Insufficient documentation

## 2019-03-22 HISTORY — DX: Personal history of COVID-19: Z86.16

## 2019-03-22 HISTORY — DX: Other specified cough: R05.8

## 2019-03-22 HISTORY — DX: Shortness of breath: R06.02

## 2019-03-22 NOTE — Progress Notes (Signed)
Spoke with patient. Results were given, no additional questions

## 2019-03-22 NOTE — Assessment & Plan Note (Signed)
Cough Shortness of Breath  Assessment: Patient continues to have ongoing cough and shortness of breath after being diagnosed with covid in january. She has been to the ED several times for this. Patient's lungs were clear upon exam in office today. Patient was noted to become short of breath and fatigued after just 1 lap around office today. O2 sats remained stable. Heart rate did increase and returned to normal with rest. Shortness of breath most likely due to physical deconditioning.   Plan: Will check chest x ray Will refer to pulmonary - May need PFT and pulmonary rehab Stay active Hand out given on post Covid nutritional rehabilitation Will order prednisone taper - please keep close watch on blood sugar May use inhaler as needed  Cough: Start gastroesophageal reflux disease treatment with elevating the head your bed and taking antacids Continue taking over-the-counter antihistamines and nasal fluticasone to help with allergic rhinitis You need to try to suppress your cough to allow your larynx (voice box) to heal.  For three days don't talk, laugh, sing, or clear your throat. Do everything you can to suppress the cough during this time. Use hard candies (sugarless Jolly Ranchers) or non-mint or non-menthol containing cough drops during this time to soothe your throat.  Use a cough suppressant (Delsym or tessalon perles) around the clock during this time.  After three days, gradually increase the use of your voice and back off on the cough suppressants.   Follow up in 2 months or sooner if needed

## 2019-04-03 ENCOUNTER — Telehealth: Payer: Self-pay | Admitting: Internal Medicine

## 2019-04-03 NOTE — Telephone Encounter (Signed)
Patient called and would like to change from the [facility] to the [facility].  Patient made follow up appointment for Thursday 04/19/2019 @ 3:20 PM

## 2019-04-03 NOTE — Telephone Encounter (Deleted)
Patient has been seeing Dr.

## 2019-04-05 NOTE — Telephone Encounter (Signed)
Patient will need to contact her insurance to see if they will cover.

## 2019-04-09 ENCOUNTER — Ambulatory Visit (INDEPENDENT_AMBULATORY_CARE_PROVIDER_SITE_OTHER): Payer: BC Managed Care – PPO | Admitting: Family Medicine

## 2019-04-09 ENCOUNTER — Encounter: Payer: Self-pay | Admitting: Family Medicine

## 2019-04-09 ENCOUNTER — Other Ambulatory Visit: Payer: Self-pay

## 2019-04-09 VITALS — BP 148/100 | HR 100 | Wt 250.0 lb

## 2019-04-09 DIAGNOSIS — F3289 Other specified depressive episodes: Secondary | ICD-10-CM

## 2019-04-09 DIAGNOSIS — E1121 Type 2 diabetes mellitus with diabetic nephropathy: Secondary | ICD-10-CM | POA: Diagnosis not present

## 2019-04-09 DIAGNOSIS — I10 Essential (primary) hypertension: Secondary | ICD-10-CM | POA: Diagnosis not present

## 2019-04-09 DIAGNOSIS — R600 Localized edema: Secondary | ICD-10-CM

## 2019-04-09 MED ORDER — AMLODIPINE BESYLATE 10 MG PO TABS: 10.0000 mg | ORAL_TABLET | Freq: Every day | ORAL | 3 refills | Status: DC

## 2019-04-09 MED ORDER — LOSARTAN POTASSIUM 50 MG PO TABS: 50.0000 mg | ORAL_TABLET | Freq: Every day | ORAL | 3 refills | Status: DC

## 2019-04-09 NOTE — Progress Notes (Addendum)
    SUBJECTIVE:   CHIEF COMPLAINT / HPI:   Edema Involves legs and abdomen Seemed to get worse about 10 months ago Started to have foamy urine last year Sees Dr. Signe Colt for nephrotic syndrome, scheduled to follow-up with her in April Also sees Dr. Elvera Lennox with endocrinology for management of her type 2 diabetes Endorses some shortness of breath with activity  Depressed mood Says that her type 2 diabetes, nephrotic syndrome, and lower extremity and abdominal edema have been weighing on her and causing her mood to suffer Thinks that if she can get a better control on her physical medical problems, she will feel less depressed Is open to speaking with the therapist but would not like to start any medication for mood today   PERTINENT  PMH / PSH: Poorly controlled type 2 diabetes, morbid obesity, hypertension, recent Covid infection  OBJECTIVE:   BP (!) 148/100   Pulse 100   Wt 250 lb (113.4 kg)   LMP 03/06/2019 (Approximate)   SpO2 97%   BMI 45.73 kg/m   General: Pleasant, morbidly obese, appears stated age Cardiac: RRR, no MRG Respiratory: CTAB, no rhonchi, rales, or wheezing, normal work of breathing Skin: no rashes or other lesions, warm and well perfused Psych: appropriate mood and affect  GAD 7 : Generalized Anxiety Score 04/10/2019 03/13/2019  Nervous, Anxious, on Edge 1 1  Control/stop worrying 1 1  Worry too much - different things 1 1  Trouble relaxing 3 1  Restless 0 1  Easily annoyed or irritable 3 2  Afraid - awful might happen 0 0  Total GAD 7 Score 9 7  Anxiety Difficulty Somewhat difficult Somewhat difficult    Depression screen [facility] 2/9 04/10/2019 04/09/2019 04/09/2019  Decreased Interest 1 (No Data) 1  Down, Depressed, Hopeless 1 - 1  PHQ - 2 Score 2 - 2  Altered sleeping 1 - -  Tired, decreased energy 2 - -  Change in appetite 3 - -  Feeling bad or failure about yourself  2 - -  Trouble concentrating 0 - -  Moving slowly or fidgety/restless 0 - -    Suicidal thoughts 0 - -  PHQ-9 Score 10 - -  Difficult doing work/chores Somewhat difficult - -  Some recent data might be hidden    ASSESSMENT/PLAN:   Essential hypertension, benign Poorly controlled today.  She is already taking losartan due to her concomitant type 2 diabetes and nephrotic syndrome.  We could increase this dose, but I will allow Dr. Signe Colt to make this change if she deems it is necessary.  We will instead start amlodipine 10 mg nightly since this should not affect her kidney function.  She will return in 1 week for nurse visit to have her blood pressure rechecked.  Depression Gave patient resources to research therapists in the area and encouraged her to reach out to 1 of these.  She is also welcome to talk to me for support as well.  Nephrotic syndrome due to diabetes mellitus (HCC) Edema is most likely due to her nephrotic syndrome.  However, given her long history of poorly controlled diabetes, will perform an echo to investigate for possible cardiac cause of her edema.  Patient understands that she needs to follow closely with Dr. Signe Colt.     Lennox Solders, MD [facility]

## 2019-04-09 NOTE — Patient Instructions (Addendum)
It was nice seeing you today Ms. Forero!  We are ordering an echocardiogram, which is an ultrasound of the heart, to make sure that your heart is pumping normally and not contributing to your swelling.  We are starting amlodipine 10 mg every night before bedtime for your blood pressure.  Please return in 1 week for a nurse appointment, where we will check your blood pressure.  Please look at the website [email or web address] to look at options for therapists in the community.  You can filter for different characteristics including age, sex, and whether they take your insurance.  Make sure to follow-up with Dr. Signe Colt and Dr. Elvera Lennox.  If you have any questions or concerns, please feel free to call the clinic.   Be well,  Dr. Frances Furbish

## 2019-04-10 DIAGNOSIS — E1121 Type 2 diabetes mellitus with diabetic nephropathy: Secondary | ICD-10-CM

## 2019-04-10 DIAGNOSIS — N1831 Chronic kidney disease, stage 3a: Secondary | ICD-10-CM | POA: Diagnosis not present

## 2019-04-10 HISTORY — DX: Type 2 diabetes mellitus with diabetic nephropathy: E11.21

## 2019-04-10 MED ORDER — DEXCOM G6 TRANSMITTER MISC: 1.0000 | 2 refills | Status: DC

## 2019-04-10 MED ORDER — DEXCOM G6 SENSOR MISC: 1.0000 | 2 refills | Status: DC

## 2019-04-10 MED ORDER — DEXCOM G6 RECEIVER DEVI: 1.0000 | 0 refills | Status: DC

## 2019-04-10 NOTE — Assessment & Plan Note (Signed)
Gave patient resources to research therapists in the area and encouraged her to reach out to 1 of these.  She is also welcome to talk to me for support as well.

## 2019-04-10 NOTE — Assessment & Plan Note (Signed)
Edema is most likely due to her nephrotic syndrome.  However, given her long history of poorly controlled diabetes, will perform an echo to investigate for possible cardiac cause of her edema.  Patient understands that she needs to follow closely with Dr. Signe Colt.

## 2019-04-10 NOTE — Addendum Note (Signed)
Addended by: Darliss Ridgel I on: 04/10/2019 02:45 PM   Modules accepted: Orders

## 2019-04-10 NOTE — Assessment & Plan Note (Signed)
Poorly controlled today.  She is already taking losartan due to her concomitant type 2 diabetes and nephrotic syndrome.  We could increase this dose, but I will allow Dr. Signe Colt to make this change if she deems it is necessary.  We will instead start amlodipine 10 mg nightly since this should not affect her kidney function.  She will return in 1 week for nurse visit to have her blood pressure rechecked.

## 2019-04-10 NOTE — Telephone Encounter (Signed)
RX sent

## 2019-04-10 NOTE — Telephone Encounter (Signed)
Patient called to advise that her insurance will cover the [facility] G6 transmitter and sensor kits.  Patient is requesting that the [facility] G6 (sensors and transmitters be sent to [facility].  Any questions contact at [phone]

## 2019-04-17 ENCOUNTER — Ambulatory Visit: Payer: BC Managed Care – PPO

## 2019-04-17 ENCOUNTER — Other Ambulatory Visit: Payer: Self-pay

## 2019-04-17 VITALS — BP 126/80 | HR 100

## 2019-04-17 DIAGNOSIS — I10 Essential (primary) hypertension: Secondary | ICD-10-CM

## 2019-04-17 NOTE — Progress Notes (Signed)
Patient here today for BP check.      Last BP was on 04/09/2019 and was 148/100  BP today is 126/80 with a pulse of 100.    Checked BP in left arm with large cuff.    Symptoms present: None  Patient takes Losartan and Amlodipine as prescribed. The patient reports no missed doses.

## 2019-04-18 NOTE — Progress Notes (Signed)
That looks great.  Thanks Page!

## 2019-04-19 ENCOUNTER — Encounter: Payer: Self-pay | Admitting: Family Medicine

## 2019-04-19 ENCOUNTER — Ambulatory Visit: Payer: BC Managed Care – PPO | Admitting: Internal Medicine

## 2019-04-19 ENCOUNTER — Other Ambulatory Visit: Payer: Self-pay

## 2019-04-19 ENCOUNTER — Telehealth: Payer: Self-pay | Admitting: Family Medicine

## 2019-04-19 MED ORDER — DEXCOM G6 SENSOR MISC: 1.0000 | 2 refills | Status: DC

## 2019-04-19 MED ORDER — DEXCOM G6 TRANSMITTER MISC: 1.0000 | 2 refills | Status: DC

## 2019-04-19 NOTE — Telephone Encounter (Signed)
Karina [name] from [facility] [employer]  states she need prior authorization for an ECO for  The pt. Pt's  Eco is tomorrow please call with authorization ph# [phone]

## 2019-04-20 ENCOUNTER — Encounter: Payer: Self-pay | Admitting: Family Medicine

## 2019-04-20 ENCOUNTER — Other Ambulatory Visit: Payer: BC Managed Care – PPO

## 2019-04-20 ENCOUNTER — Telehealth (INDEPENDENT_AMBULATORY_CARE_PROVIDER_SITE_OTHER): Payer: BC Managed Care – PPO | Admitting: Internal Medicine

## 2019-04-20 ENCOUNTER — Ambulatory Visit (HOSPITAL_COMMUNITY): Payer: BC Managed Care – PPO

## 2019-04-20 ENCOUNTER — Other Ambulatory Visit: Payer: Self-pay

## 2019-04-20 ENCOUNTER — Encounter: Payer: Self-pay | Admitting: Internal Medicine

## 2019-04-20 ENCOUNTER — Telehealth: Payer: Self-pay | Admitting: Family Medicine

## 2019-04-20 DIAGNOSIS — E118 Type 2 diabetes mellitus with unspecified complications: Secondary | ICD-10-CM

## 2019-04-20 DIAGNOSIS — Z794 Long term (current) use of insulin: Secondary | ICD-10-CM | POA: Diagnosis not present

## 2019-04-20 DIAGNOSIS — E1165 Type 2 diabetes mellitus with hyperglycemia: Secondary | ICD-10-CM

## 2019-04-20 DIAGNOSIS — Z6841 Body Mass Index (BMI) 40.0 and over, adult: Secondary | ICD-10-CM

## 2019-04-20 DIAGNOSIS — E113313 Type 2 diabetes mellitus with moderate nonproliferative diabetic retinopathy with macular edema, bilateral: Secondary | ICD-10-CM

## 2019-04-20 MED ORDER — OZEMPIC (0.25 OR 0.5 MG/DOSE) 2 MG/1.5ML ~~LOC~~ SOPN: 0.5000 mg | PEN_INJECTOR | SUBCUTANEOUS | 3 refills | Status: DC

## 2019-04-20 MED ORDER — INSULIN LISPRO (1 UNIT DIAL) 100 UNIT/ML (KWIKPEN): 20.0000 [IU] | PEN_INJECTOR | Freq: Three times a day (TID) | SUBCUTANEOUS | 3 refills | Status: DC

## 2019-04-20 MED ORDER — TRESIBA FLEXTOUCH 200 UNIT/ML ~~LOC~~ SOPN: 50.0000 [IU] | PEN_INJECTOR | Freq: Every day | SUBCUTANEOUS | 3 refills | Status: DC

## 2019-04-20 NOTE — Patient Instructions (Addendum)
Please restart: - Tresiba U200 50 units daily - Ozempic 0.5 mg weekly.  Change: - Humalog 20-28 units 3x a day before each meal  Please return in 3 months with your sugar log.

## 2019-04-20 NOTE — Progress Notes (Signed)
Patient ID: Karina [name], female   DOB: 1985/07/30, 34 y.o.   MRN: [id number]   Patient location: Home My location: Office Persons participating in the virtual visit: patient, provider  Referring Provider: Lennox Solders, MD  I connected with the patient on 04/20/19 at  8:00 AM EDT by a video enabled telemedicine application and verified that I am speaking with the correct person.   I discussed the limitations of evaluation and management by telemedicine and the availability of in person appointments. The patient expressed understanding and agreed to proceed.   Details of the encounter are shown below.  HPI: Karina [name] is a 34 y.o.-year-old female, initially referred by her PCP, Dr. Frances Furbish, returning for follow-up for DM2, dx in 2004, insulin-dependent since 2009, uncontrolled, with long-term complications (peripheral neuropathy, moderate retinopathy + macular edema).  Last visit 6 months ago.  She is not compliant with appointments, recommended medications, and sugar checks.  She had COVID-19 in 01/2019. She is still fatigued. Sugars were great during the illness but they increased after she relaxed her diet afterwards. She was also on steroids afterwards >> sugars higher - 588 max 2 weeks ago at her visit with the nephrologist.  At last visit, she was not taking her insulin consistently and not taking Ozempic at all.  We restarted Humalog and Ozempic.  I also advised her to check with her insurance to see if an insulin pump will be covered.  Reviewed HbA1c levels: Lab Results  Component Value Date   HGBA1C 14.3 (A) 10/27/2018   HGBA1C 14.2 (A) 05/29/2018   HGBA1C 13.0 (A) 03/07/2018   HGBA1C 11.7 (A) 12/02/2017   HGBA1C 10.6 (A) 08/29/2017   HGBA1C 14.5 05/16/2017   HGBA1C 13.6 08/16/2016   HGBA1C 14.0 05/24/2016   HGBA1C 13.5 11/24/2015   HGBA1C 14.1 03/21/2015   She is on: - Tresiba U200 50 units daily >> skipping doses off for few days... - Humalog 15 to 20 >>  24 units 3x a day before each meal - Ozempic 0.5 mg weekly >> started but ran out... cannot remember why... At last visit she was not checking sugars at all. Still not checking  >> will get a Dexcom CGM soon: - am: 197-592 >> 80-90s (Covid 19) - 2h after b'fast: n/c >> 115  (Covid 19) - before lunch: n/c - 2h after lunch: n/c >> 110-120 (Covid 19) - before dinner: n/c - 2h after dinner: n/c  >> 110-120 (Covid 19) - bedtime: 300-HI >> n/c - nighttime: n/c Lowest sugar was 197 >> 62 >> 80; she has hypoglycemia awareness in the 80s or 90s. Highest sugar was HI >> HI >> 500 >> 588.  Glucometer: AccuChek  Pt's meals are: - Breakfast: bagel or grits, veggie sausage - Lunch: sandwich with vegan deli meat, taco, home cooked meals - Dinner: fish + veggies +/- starch - Snacks: chips, sweet drinks She is working on decreasing starches.  She also replaced red meats with fish.  -+ CKD-sees nephrology:  Lab Results  Component Value Date   BUN 12 09/14/2018   BUN 15 08/25/2018   CREATININE 1.13 (H) 09/14/2018   CREATININE 1.39 (H) 08/25/2018  On Cozaar.  -+ HL; last set of lipids: Lab Results  Component Value Date   CHOL 208 (H) 09/14/2018   HDL 52 09/14/2018   LDLCALC 123 (H) 09/14/2018   TRIG 190 (H) 09/14/2018   CHOLHDL 4.0 09/14/2018  She is not on a statin  - last eye exam  was in 12/2018: + DR, + macular edema - dx. 2015. She will see a retina specialist.  -she has Numbness and tingling in her feet.  On Neurontin.  Pt has FH of DM in father.  She also has a history of irregular menstrual cycles, toenail fungus, boils.  ROS: Constitutional: + weight gain/no weight loss, no fatigue, no subjective hyperthermia, no subjective hypothermia Eyes: no blurry vision, no xerophthalmia ENT: no sore throat, no nodules palpated in neck, no dysphagia, no odynophagia, no hoarseness Cardiovascular: no CP/no SOB/no palpitations/+ leg swelling and pain Respiratory: no cough/no SOB/no  wheezing Gastrointestinal: no N/no V/no D/no C/no acid reflux Musculoskeletal: no muscle aches/no joint aches Skin: no rashes, no hair loss Neurological: no tremors/+ numbness/+ tingling/no dizziness  I reviewed pt's medications, allergies, PMH, social hx, family hx, and changes were documented in the history of present illness. Otherwise, unchanged from my initial visit note.  Past Medical History:  Diagnosis Date  . Diabetes mellitus    Type 2, insulin resistant  . Syncope 04/08/2015   Past Surgical History:  Procedure Laterality Date  . CERVICAL BIOPSY  2004  . CESAREAN SECTION N/A 06/02/2012   Procedure: CESAREAN SECTION;  Surgeon: Catalina Antigua, MD;  Location: [facility];  Service: Obstetrics;  Laterality: N/A;   Social History   Socioeconomic History  . Marital status: Single    Spouse name: Not on file  . Number of children: Not on file  . Years of education: Not on file  . Highest education level: Not on file  Occupational History  . Not on file  Tobacco Use  . Smoking status: Never Smoker  . Smokeless tobacco: Never Used  Substance and Sexual Activity  . Alcohol use: No  . Drug use: No  . Sexual activity: Yes    Birth control/protection: None  Other Topics Concern  . Not on file  Social History Narrative  . Not on file   Social Determinants of Health   Financial Resource Strain:   . Difficulty of Paying Living Expenses:   Food Insecurity:   . Worried About Programme researcher, broadcasting/film/video in the Last Year:   . Barista in the Last Year:   Transportation Needs:   . Freight forwarder (Medical):   [name] Lack of Transportation (Non-Medical):   Physical Activity:   . Days of Exercise per Week:   . Minutes of Exercise per Session:   Stress:   . Feeling of Stress :   Social Connections:   . Frequency of Communication with Friends and Family:   . Frequency of Social Gatherings with Friends and Family:   . Attends Religious Services:   . Active Member of Clubs or  Organizations:   . Attends Banker Meetings:   [name] Marital Status:   Intimate Partner Violence:   . Fear of Current or Ex-Partner:   . Emotionally Abused:   [name] Physically Abused:   . Sexually Abused:    Current Outpatient Medications on File Prior to Visit  Medication Sig Dispense Refill  . albuterol (VENTOLIN HFA) 108 (90 Base) MCG/ACT inhaler Inhale 2 puffs into the lungs every 4 (four) hours as needed for wheezing or shortness of breath. 18 g 0  . amLODipine (NORVASC) 10 MG tablet Take 1 tablet (10 mg total) by mouth at bedtime. 90 tablet 3  . benzonatate (TESSALON) 200 MG capsule Take 1 capsule (200 mg total) by mouth 2 (two) times daily as needed for cough. 20 capsule  0  . Continuous Blood Gluc Receiver (DEXCOM G6 RECEIVER) DEVI 1 Device by Does not apply route See admin instructions. 1 each 0  . Continuous Blood Gluc Sensor (DEXCOM G6 SENSOR) MISC 1 each by Does not apply route See admin instructions. Apply new sensor every 10 days. 9 each 2  . Continuous Blood Gluc Transmit (DEXCOM G6 TRANSMITTER) MISC 1 each by Does not apply route every 3 (three) months. 1 each 2  . fluticasone (FLONASE) 50 MCG/ACT nasal spray Place 2 sprays into both nostrils daily. 16 g 6  . ibuprofen (ADVIL) 600 MG tablet Take 1 tablet (600 mg total) by mouth 3 (three) times daily. (Patient not taking: Reported on 03/21/2019) 21 tablet 0  . Insulin Degludec (TRESIBA FLEXTOUCH) 200 UNIT/ML SOPN Inject 50 Units into the skin daily. 3 pen 5  . insulin lispro (HUMALOG KWIKPEN) 100 UNIT/ML KwikPen Inject 0.2 mLs (20 Units total) into the skin 3 (three) times daily. 15 mL 3  . Insulin Pen Needle (B-D ULTRAFINE III SHORT PEN) 31G X 8 MM MISC 1 Container by Does not apply route as needed. 1 each 11  . Insulin Syringe-Needle U-100 (ULTICARE INSULIN SYRINGE) 31G X 5/16" 1 ML MISC Use to inject insulin. 200 each 1  . losartan (COZAAR) 50 MG tablet Take 1 tablet (50 mg total) by mouth at bedtime. 90 tablet 3  .  methocarbamol (ROBAXIN) 500 MG tablet Take 1 tablet (500 mg total) by mouth 2 (two) times daily. 14 tablet 0  . omeprazole (PRILOSEC) 20 MG capsule Take 1 capsule (20 mg total) by mouth daily. 30 capsule 3  . predniSONE (DELTASONE) 10 MG tablet Take 4 tabs for 2 days, then 3 tabs for 2 days, then 2 tabs for 2 days, then 1 tab for 2 days, then stop 20 tablet 0  . [DISCONTINUED] gabapentin (NEURONTIN) 100 MG capsule Take 1 capsule (100 mg total) by mouth at bedtime. 90 capsule 3   No current facility-administered medications on file prior to visit.   Allergies  Allergen Reactions  . Lisinopril Cough   Family History  Problem Relation Age of Onset  . Diabetes Father   . Cancer Mother   . Diabetes Mother    PE: There were no vitals taken for this visit. Wt Readings from Last 3 Encounters:  04/09/19 250 lb (113.4 kg)  03/21/19 251 lb (113.9 kg)  03/12/19 244 lb 3.2 oz (110.8 kg)   Constitutional:  in NAD  The physical exam was not performed (virtual visit).  ASSESSMENT: 1. 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  Obesity  PLAN:  1. Patient with longstanding, uncontrolled, type 2 diabetes, with history of noncompliance with the basal-bolus insulin regimen, GLP-1 receptor agonist, blood sugar checks and visits.  At last visit she was only taking [place] and only occasional NovoLog.  One impediment in her diabetes control is that she works nights.  -At last visit she finally revealed that she is actually not taking insulin because she does not want to stick herself.  She told me that she had an insulin pump at home (630 G Medtronic) and would like to start using it.  I referred her to nutrition and then we discussed about referral to diabetes education to start the pump.  She did not follow-up with these recommendations. A VGo mechanical pump  would also be helpful if the regular  pump is  not covered for her. -We did discuss at last visit that if she is not taking insulin and checking sugars are coming for the appointment as advised, I cannot help her. - at this visit, she is still not checking sugars.  She is in the process of obtaining the Dexcom CGM.  Freestyle libre did not work well for her. - She tells me that she came off [place] few days ago after she finished the prescription.  I advised her not to come off the insulin anymore.  I sent a prescription for 3 months of the insulin to her pharmacy.  She is not using fixed doses of Humalog and I advised her to fluctuate the dose based on the size of the meal. -She is also off Ozempic and she does not remember why she stopped it.  She feels that she ran out.  I will send a new prescription and strongly advised her to stay on it.  Explained the benefits. - I suggested to:  Patient Instructions  Please restart: - Tresiba U200 50 units daily - Ozempic 0.5 mg weekly.  Change: - Humalog 20-28 units 3x a day before each meal  Please return in 3 months with your sugar log.   - advised to check sugars at different times of the day - 4x a day, rotating check times - advised for yearly eye exams >> she is UTD - return to clinic in 3 months   2.  Obesity class III  -At last visit, we started Ozempic, but she stopped since then.  We will restart this which should also help with weight loss -Bariatric surgery was not covered by her insurance -We discussed at last visit phentermine would be contraindicated in her because of the high heart rate. -At last visit she weighed 232 pounds, now 250, gained 18 pounds  Philemon Kingdom, MD PhD [facility]

## 2019-04-20 NOTE — Telephone Encounter (Signed)
**  After Hours/ Emergency Line Call**  Received a page to call [phone]) - [phone].  Patient: Karina [name]  Caller: Self   Subjective:  Caller reports leg swelling that is not improving with fluid medication or elevation.  Her leg swelling is a chronic issue which has been present for almost a year.  Patient reports that her legs have never felt like this before.  She reports that the swelling goes from her feet all the way to mid thighs. She describes her pain as an 8 out of 10 and they feel very tight.  Patient denies any fevers, chills, chest pain, shortness of breath, nausea, vomiting.  She denies any redness or warmth in her legs. Main concerns include no obvious improvement despite diuretics and elevation.   Objective:  Observations: NAD no respiratory distress, speaking in full sentences, calm,   Assessment & Plan  Karina [name] is a 34 y.o. female with PMHx of nephrotic syndrome, hypertension, type 2 diabetes who calls with the following complaints and concerns: Acutely worsened leg swelling.  On chart review, appears that patient was just started on amlodipine on 04/09/2019.  Patient reports that she has been taking this medication and has had very good blood pressure in response.  She reports forgetting that Dr. Frances Furbish told her about the swelling in her legs and that if that did happen, she should discontinue the medication.  The swelling in her legs is most likely related to this.  She also reports that she has not been elevating her legs above the level of her heart.  She does not have any redness or warmth or severe pain that would be concerning for DVT, also less likely due to bilateral leg tightness.  Patient reports that she has been urinating almost every hour, if not twice an hour, which shows that her diuretics are working.  Since peripheral edema is dose related, gave patient the option of trying to cut her blood pressure pill in half and take 2 and half or 5 mg.  Either way, she  should continue to monitor her blood pressures at home and plan on making a follow-up visit with Dr. Frances Furbish at her earliest convenience for change in medication for blood pressure control.  For pain, recommended patient use Tylenol and avoid NSAIDs.  Patient given return precautions for emergency department, including shortness of breath, red and painful legs, chest pain.  I would also recommend going to the emergency department if patient has significant pain, given her limitations in over-the-counter pain medications.  It sounds like she is responding well to her oral diuretics, therefore unlikely that she is in need of IV diuretics at this time.  Patient agrees to go to ED for any red flag symptoms.  She reports that she will elevate her legs above the level of her heart and she is almost due for her next dose of diuretic.  If that does not help improve the pain in her legs, she will go to the emergency department.  -- Recommendations:  -Can try Ace bandages to help with compression -Tylenol only for pain, no NSAIDs -ED for red flag symptoms -Make follow-up appointment with Dr. Frances Furbish for hypertension medication change -Continue monitoring blood pressures at home until appointment with Dr. Frances Furbish  -- Red flags discussed.   -- Will forward to PCP.  Melene Plan, M.D.  PGY-2  [facility] 04/20/2019 1:29 AM

## 2019-04-23 ENCOUNTER — Other Ambulatory Visit: Payer: BC Managed Care – PPO

## 2019-04-23 NOTE — Telephone Encounter (Signed)
This appointment was cancelled per Dr. Frances Furbish.Karina [name], CMA

## 2019-05-02 DIAGNOSIS — R809 Proteinuria, unspecified: Secondary | ICD-10-CM | POA: Diagnosis not present

## 2019-05-02 DIAGNOSIS — E1122 Type 2 diabetes mellitus with diabetic chronic kidney disease: Secondary | ICD-10-CM | POA: Diagnosis not present

## 2019-05-02 DIAGNOSIS — N1831 Chronic kidney disease, stage 3a: Secondary | ICD-10-CM | POA: Diagnosis not present

## 2019-05-07 ENCOUNTER — Encounter: Payer: Self-pay | Admitting: Internal Medicine

## 2019-05-08 ENCOUNTER — Other Ambulatory Visit: Payer: Self-pay | Admitting: *Deleted

## 2019-05-09 ENCOUNTER — Encounter: Payer: Self-pay | Admitting: Family Medicine

## 2019-05-09 MED ORDER — BENZONATATE 200 MG PO CAPS: 200.0000 mg | ORAL_CAPSULE | Freq: Two times a day (BID) | ORAL | 0 refills | Status: DC | PRN

## 2019-05-15 ENCOUNTER — Encounter (INDEPENDENT_AMBULATORY_CARE_PROVIDER_SITE_OTHER): Payer: BC Managed Care – PPO | Admitting: Ophthalmology

## 2019-05-21 ENCOUNTER — Other Ambulatory Visit: Payer: Self-pay

## 2019-05-21 ENCOUNTER — Ambulatory Visit: Payer: BC Managed Care – PPO

## 2019-05-21 ENCOUNTER — Ambulatory Visit (INDEPENDENT_AMBULATORY_CARE_PROVIDER_SITE_OTHER): Payer: BC Managed Care – PPO | Admitting: Nurse Practitioner

## 2019-05-21 VITALS — BP 136/88 | HR 95 | Temp 97.1°F | Ht 62.0 in | Wt 259.5 lb

## 2019-05-21 DIAGNOSIS — R0602 Shortness of breath: Secondary | ICD-10-CM | POA: Diagnosis not present

## 2019-05-21 DIAGNOSIS — R0600 Dyspnea, unspecified: Secondary | ICD-10-CM | POA: Insufficient documentation

## 2019-05-21 DIAGNOSIS — Z8616 Personal history of COVID-19: Secondary | ICD-10-CM | POA: Diagnosis not present

## 2019-05-21 DIAGNOSIS — R0609 Other forms of dyspnea: Secondary | ICD-10-CM

## 2019-05-21 DIAGNOSIS — R059 Cough, unspecified: Secondary | ICD-10-CM

## 2019-05-21 DIAGNOSIS — R000 Tachycardia, unspecified: Secondary | ICD-10-CM | POA: Diagnosis not present

## 2019-05-21 DIAGNOSIS — R05 Cough: Secondary | ICD-10-CM

## 2019-05-21 HISTORY — DX: Dyspnea, unspecified: R06.00

## 2019-05-21 HISTORY — DX: Other forms of dyspnea: R06.09

## 2019-05-21 HISTORY — DX: Tachycardia, unspecified: R00.0

## 2019-05-21 MED ORDER — OMEPRAZOLE 20 MG PO CPDR: 20.0000 mg | DELAYED_RELEASE_CAPSULE | Freq: Every day | ORAL | 3 refills | Status: DC

## 2019-05-21 NOTE — Patient Instructions (Addendum)
Shortness of breath Cough:  Will place referral to pulmonary - may need PFT, may need pulmonary rehab  -Continue gastroesophageal reflux disease treatment with elevating the head your bed and taking antacid -Continue taking over-the-counter antihistamines and nasal fluticasone to help with allergic rhinitis -Use hard candies (sugarless Jolly Ranchers) or non-mint or non-menthol containing cough drops  -take sips of water to clear throat  Tachycardia Lower extremity edema:  Will place referral to cardiology   Patient walked in office today sats remained above 97% for entire walk heart rate increased to 120 BPM with minimal exertion  Stay active - may use albuterol inhaler before walking routine   Follow up in 2 months or sooner if needed  

## 2019-05-21 NOTE — Progress Notes (Signed)
 @Patient ID: Karina [name], female    DOB: 02/12/1985, 33 y.o.   MRN: [id number]  Chief Complaint  Patient presents with  . Follow-up    Patient stated she is still feeling SOB and fatigue.   . cough and wheezing    Referring provider: Winfrey, Amanda C, MD  33 year old female with history of diabetes, morbid obesity, and depression. Diagnosed with Covid in January.    Recent Significant Encounters:   Timeline of Illness:  01/18/19 - ED visit - Covid positive   01/31/19 - ED visit - Shortness of breath, - advised to follow up with PCP  02/15/19 - UC visit - Ongoing cough - Tessalon Perles and albuterol inhaler prescribed  02/16/19 ED visit - Chest pain - EKG - NSR - prescribed robaxin  03/02/19  UC tele-visit - Shortness of breath and chest pain - advised to go to ED  03/02/19 ED visit - Prescribed Z-pak, ibuprofen, tessalon perles  03/12/19 PCP follow up - diagnosed with carpal tunnel, right shoulder strain, depression. Noted to be slowly improving with Covid symptoms.   03/21/19 Post Covid Care Initial visit: Continuing shortness of breath and cough. O2 Sats were stable when walked in office. X ray was clear. Referral placed to pulmonary. Prednisone taper. Started on Reflux medication and antihistamine for cough.    Imaging:   Chest Xray 02/16/19: No active cardiopulmonary disease.  Chest X ray 03/21/19: Unremarkable radiographs of the chest.  Labs:  POC SARS Coronavirus 01/18/19: positive  HPI  Patient presents today for post Covid care clinic follow-up.  Since her last visit here she has followed up with her PCP.  She had discussed peripheral edema with her PCP and echo has been ordered.  Patient states that taking Prilosec and Allegra did help her cough but she is currently out of Prilosec.  She states that since she has stopped taking this that her cough has returned.  She does still complain of shortness of breath with exertion.  She is trying to walk 3 days  a week and states that she does not have to use her inhaler after walking 1 lap on the track (one fourth of a mile).  Patient's chest x-ray last visit was clear.  She has not been scheduled to see pulmonary yet. Denies f/c/s, n/v/d, hemoptysis, PND, chest pain. Patient does report edema to bilateral lower extremities and hands.    Note: Patient walked in office today sats remained above 97% for entire walk heart rate increased to 120 BPM with minimal exertion    Allergies  Allergen Reactions  . Lisinopril Cough    Immunization History  Administered Date(s) Administered  . Influenza Split 12/20/2011  . Influenza,inj,Quad PF,6+ Mos 11/10/2015  . MMR 06/03/2012  . PFIZER SARS-COV-2 Vaccination 04/04/2019  . Tdap 03/13/2012    Past Medical History:  Diagnosis Date  . Diabetes mellitus    Type 2, insulin resistant  . Syncope 04/08/2015    Tobacco History: Social History   Tobacco Use  Smoking Status Never Smoker  Smokeless Tobacco Never Used   Counseling given: Not Answered   Outpatient Encounter Medications as of 05/21/2019  Medication Sig  . albuterol (VENTOLIN HFA) 108 (90 Base) MCG/ACT inhaler Inhale 2 puffs into the lungs every 4 (four) hours as needed for wheezing or shortness of breath.  . benzonatate (TESSALON) 200 MG capsule Take 1 capsule (200 mg total) by mouth 2 (two) times daily as needed for cough.  . Continuous Blood Gluc   Receiver (DEXCOM G6 RECEIVER) DEVI 1 Device by Does not apply route See admin instructions.  . Continuous Blood Gluc Sensor (DEXCOM G6 SENSOR) MISC 1 each by Does not apply route See admin instructions. Apply new sensor every 10 days.  . Continuous Blood Gluc Transmit (DEXCOM G6 TRANSMITTER) MISC 1 each by Does not apply route every 3 (three) months.  . insulin degludec (TRESIBA FLEXTOUCH) 200 UNIT/ML FlexTouch Pen Inject 50 Units into the skin daily.  . insulin lispro (HUMALOG KWIKPEN) 100 UNIT/ML KwikPen Inject 0.2-0.28 mLs (20-28 Units total)  into the skin 3 (three) times daily.  . Insulin Pen Needle (B-D ULTRAFINE III SHORT PEN) 31G X 8 MM MISC 1 Container by Does not apply route as needed.  . Insulin Syringe-Needle U-100 (ULTICARE INSULIN SYRINGE) 31G X 5/16" 1 ML MISC Use to inject insulin.  [name] losartan (COZAAR) 50 MG tablet Take 1 tablet (50 mg total) by mouth at bedtime.  [name] omeprazole (PRILOSEC) 20 MG capsule Take 1 capsule (20 mg total) by mouth daily.  . [DISCONTINUED] omeprazole (PRILOSEC) 20 MG capsule Take 1 capsule (20 mg total) by mouth daily.  [name] amLODipine (NORVASC) 10 MG tablet Take 1 tablet (10 mg total) by mouth at bedtime. (Patient not taking: Reported on 05/21/2019)  . fluticasone (FLONASE) 50 MCG/ACT nasal spray Place 2 sprays into both nostrils daily. (Patient not taking: Reported on 05/21/2019)  . ibuprofen (ADVIL) 600 MG tablet Take 1 tablet (600 mg total) by mouth 3 (three) times daily. (Patient not taking: Reported on 03/21/2019)  . methocarbamol (ROBAXIN) 500 MG tablet Take 1 tablet (500 mg total) by mouth 2 (two) times daily. (Patient not taking: Reported on 05/21/2019)  . predniSONE (DELTASONE) 10 MG tablet Take 4 tabs for 2 days, then 3 tabs for 2 days, then 2 tabs for 2 days, then 1 tab for 2 days, then stop (Patient not taking: Reported on 05/21/2019)  . Semaglutide,0.25 or 0.5MG/DOS, (OZEMPIC, 0.25 OR 0.5 MG/DOSE,) 2 MG/1.5ML SOPN Inject 0.5 mg into the skin once a week. (Patient not taking: Reported on 05/21/2019)  . [DISCONTINUED] gabapentin (NEURONTIN) 100 MG capsule Take 1 capsule (100 mg total) by mouth at bedtime.   No facility-administered encounter medications on file as of 05/21/2019.     Review of Systems  Review of Systems  Constitutional: Positive for activity change (decreased) and fatigue.  HENT: Negative.   Respiratory: Positive for cough and shortness of breath.   Cardiovascular: Positive for leg swelling. Negative for chest pain.  Gastrointestinal: Negative.   Allergic/Immunologic:  Negative.   Neurological: Negative.   Psychiatric/Behavioral: Negative.        Physical Exam  BP 136/88 (BP Location: Left Arm, Patient Position: Sitting, Cuff Size: Large)   Pulse 95   Temp (!) 97.1 F (36.2 C)   Ht 5' 2" (1.575 m)   Wt 259 lb 8 oz (117.7 kg)   LMP 05/16/2019   SpO2 100%   BMI 47.46 kg/m   Wt Readings from Last 5 Encounters:  05/21/19 259 lb 8 oz (117.7 kg)  04/09/19 250 lb (113.4 kg)  03/21/19 251 lb (113.9 kg)  03/12/19 244 lb 3.2 oz (110.8 kg)  01/18/19 245 lb (111.1 kg)     Physical Exam Vitals and nursing note reviewed.  Constitutional:      General: She is not in acute distress.    Appearance: She is well-developed.  Cardiovascular:     Rate and Rhythm: Normal rate and regular rhythm.  Pulmonary:  Effort: Pulmonary effort is normal.     Breath sounds: Normal breath sounds.  Musculoskeletal:     Right lower leg: Edema (1+) present.     Left lower leg: Edema (1+) present.  Neurological:     Mental Status: She is alert and oriented to person, place, and time.      Lab Results:  CBC    Component Value Date/Time   WBC 5.3 08/25/2018 1102   WBC 5.6 05/22/2018 2119   RBC 4.29 08/25/2018 1102   RBC 4.31 05/22/2018 2119   HGB 11.2 08/25/2018 1102   HCT 35.2 08/25/2018 1102   PLT 410 08/25/2018 1102   MCV 82 08/25/2018 1102   MCH 26.1 (L) 08/25/2018 1102   MCH 26.9 05/22/2018 2119   MCHC 31.8 08/25/2018 1102   MCHC 33.6 05/22/2018 2119   RDW 13.3 08/25/2018 1102   LYMPHSABS 1.9 07/04/2014 1107   MONOABS 0.2 07/04/2014 1107   EOSABS 0.0 07/04/2014 1107   BASOSABS 0.0 07/04/2014 1107      Assessment & Plan:   History of COVID-19 Shortness of breath Cough:  Will place referral to pulmonary - may need PFT, may need pulmonary rehab  -Continue gastroesophageal reflux disease treatment with elevating the head your bed and taking antacid -Continue taking over-the-counter antihistamines and nasal fluticasone to help with  allergic rhinitis -Use hard candies (sugarless Jolly Ranchers) or non-mint or non-menthol containing cough drops  -take sips of water to clear throat  Tachycardia Lower extremity edema:  Will place referral to cardiology   Patient walked in office today sats remained above 97% for entire walk heart rate increased to 120 BPM with minimal exertion  Stay active - may use albuterol inhaler before walking routine   Follow up in 2 months or sooner if needed      Fenton Foy, NP 05/21/2019

## 2019-05-21 NOTE — Assessment & Plan Note (Signed)
Shortness of breath Cough:  Will place referral to pulmonary - may need PFT, may need pulmonary rehab  -Continue gastroesophageal reflux disease treatment with elevating the head your bed and taking antacid -Continue taking over-the-counter antihistamines and nasal fluticasone to help with allergic rhinitis -Use hard candies (sugarless Jolly Ranchers) or non-mint or non-menthol containing cough drops  -take sips of water to clear throat  Tachycardia Lower extremity edema:  Will place referral to cardiology   Patient walked in office today sats remained above 97% for entire walk heart rate increased to 120 BPM with minimal exertion  Stay active - may use albuterol inhaler before walking routine   Follow up in 2 months or sooner if needed

## 2019-05-22 ENCOUNTER — Encounter: Payer: Self-pay | Admitting: Family Medicine

## 2019-05-22 ENCOUNTER — Telehealth: Payer: Self-pay | Admitting: Family Medicine

## 2019-05-22 NOTE — Telephone Encounter (Signed)
Pt dropped off disability forms to be filled out. I have placed this in the white team folder at the front desk. jw

## 2019-05-22 NOTE — Telephone Encounter (Signed)
Clinical info completed on FMLA form.  Place form in Dr. Winfrey's box for completion.  Raza Bayless Zimmerman Rumple, CMA  

## 2019-05-23 ENCOUNTER — Encounter: Payer: Self-pay | Admitting: Internal Medicine

## 2019-05-23 ENCOUNTER — Ambulatory Visit (INDEPENDENT_AMBULATORY_CARE_PROVIDER_SITE_OTHER): Payer: BC Managed Care – PPO | Admitting: Internal Medicine

## 2019-05-23 ENCOUNTER — Other Ambulatory Visit: Payer: Self-pay

## 2019-05-23 DIAGNOSIS — R0600 Dyspnea, unspecified: Secondary | ICD-10-CM

## 2019-05-23 DIAGNOSIS — R058 Other specified cough: Secondary | ICD-10-CM

## 2019-05-23 DIAGNOSIS — R0609 Other forms of dyspnea: Secondary | ICD-10-CM

## 2019-05-23 DIAGNOSIS — R05 Cough: Secondary | ICD-10-CM | POA: Diagnosis not present

## 2019-05-23 MED ORDER — METHYLPREDNISOLONE ACETATE 80 MG/ML IJ SUSP
120.0000 mg | Freq: Once | INTRAMUSCULAR | Status: AC
  Administered 2019-05-23: 120 mg via INTRAMUSCULAR

## 2019-05-23 MED ORDER — BENZONATATE 200 MG PO CAPS
ORAL_CAPSULE | ORAL | 1 refills | Status: DC
Start: 2019-05-23 — End: 2019-12-25

## 2019-05-23 NOTE — Progress Notes (Signed)
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      History of Present Illness  05/23/2019  Pulmonary/ 1st office eval/Neal Oshea  Chief Complaint  Patient presents with  . Pulmonary Consult    SOb X 4 month, coughing without phlegm, chest burn when coughing, post covid   Dyspnea:  Onset at rest lasts   sev minutes even if not coughing / assoc with chest pressure not necessarily related cough/ no nausea/ no diaphoresis  Cough: dry hacking pattern day and night but worese talking, eating  Sleep: on back since covid can't breath/ pillows get her up 30 degrees but bed is flat  SABA use: ventolin ? Helps if takes before ex  Prednisone helped some   No obvious other patterns in day to day or daytime variability or assoc excess/ purulent sputum or mucus plugs or hemoptysis or cp or chest tightness, subjective wheeze or overt sinus or hb symptoms.    . Also denies any obvious fluctuation of symptoms with weather or environmental changes or other aggravating or alleviating factors except as outlined above   No unusual exposure hx or h/o childhood pna/ asthma or knowledge of premature birth.  Current Allergies, Complete Past Medical History, Past Surgical History, Family History, and Social History were reviewed in [employer] record.  ROS  The following are not active complaints unless bolded Hoarseness, sore throat, dysphagia, dental problems, itching, sneezing,  nasal congestion or discharge of excess mucus or purulent secretions, ear ache,   fever, chills, sweats, unintended wt loss or wt  gain, classically pleuritic or exertional cp,  orthopnea pnd or arm/hand swelling  or leg swelling, presyncope, palpitations, abdominal pain, anorexia, nausea, vomiting, diarrhea  or change in bowel habits or change in bladder habits, change in stools or change in urine, dysuria, hematuria,  rash, arthralgias, visual complaints, headache, numbness, weakness or ataxia or problems with walking or coordination,  change in mood or  memory.           Past Medical History:  Diagnosis Date  . Diabetes mellitus    Type 2, insulin resistant  . Syncope 04/08/2015    Outpatient Medications Prior to Visit  Medication Sig Dispense Refill  . albuterol (VENTOLIN HFA) 108 (90 Base) MCG/ACT inhaler Inhale 2 puffs into the lungs every 4 (four) hours as needed for wheezing or shortness of breath. 18 g 0  . benzonatate (TESSALON) 200 MG capsule Take 1 capsule (200 mg total) by mouth 2 (two) times daily as needed for cough. 20 capsule 0  . Continuous Blood Gluc Receiver (DEXCOM G6 RECEIVER) DEVI 1 Device by Does not apply route See admin instructions. 1 each 0  . Continuous Blood Gluc Sensor (DEXCOM G6 SENSOR) MISC 1 each by Does not apply route See admin instructions. Apply new sensor every 10 days. 9 each 2  . Continuous Blood Gluc Transmit (DEXCOM G6 TRANSMITTER) MISC 1 each by Does not apply route every 3 (three) months. 1 each 2  . fluticasone (FLONASE) 50 MCG/ACT nasal spray Place 2 sprays into both  nostrils daily. 16 g 6  . ibuprofen (ADVIL) 600 MG tablet Take 1 tablet (600 mg total) by mouth 3 (three) times daily. 21 tablet 0  . insulin degludec (TRESIBA FLEXTOUCH) 200 UNIT/ML FlexTouch Pen Inject 50 Units into the skin daily. 9 pen 3  . insulin lispro (HUMALOG KWIKPEN) 100 UNIT/ML KwikPen Inject 0.2-0.28 mLs (20-28 Units total) into the skin 3 (three) times daily. 45 mL 3  . Insulin Pen Needle (B-D ULTRAFINE III SHORT PEN) 31G X 8 MM MISC 1 Container by Does not apply route as needed. 1 each 11  . Insulin  Syringe-Needle U-100 (ULTICARE INSULIN SYRINGE) 31G X 5/16" 1 ML MISC Use to inject insulin. 200 each 1  . losartan (COZAAR) 50 MG tablet Take 1 tablet (50 mg total) by mouth at bedtime. 90 tablet 3  . omeprazole (PRILOSEC) 20 MG capsule Take 1 capsule (20 mg total) by mouth daily. 30 capsule 3  . amLODipine (NORVASC) 10 MG tablet Take 1 tablet (10 mg total) by mouth at bedtime. 90 tablet 3  . methocarbamol (ROBAXIN) 500 MG tablet Take 1 tablet (500 mg total) by mouth 2 (two) times daily. 14 tablet 0  .     0  . Semaglutide,0.25 or 0.5MG /DOS, (OZEMPIC, 0.25 OR 0.5 MG/DOSE,) 2 MG/1.5ML SOPN Inject 0.5 mg into the skin once a week. 3 pen 3      Objective:     BP 122/82 (BP Location: Left Arm, Cuff Size: Large)   Pulse 98   Temp (!) 96.9 F (36.1 C) (Temporal)   Ht 5\' 2"  (1.575 m)   Wt 262 lb 9.6 oz (119.1 kg)   LMP 05/16/2019   SpO2 97% Comment: room air  BMI 48.03 kg/m   SpO2: 97 %(room air)   Wt Readings from Last 3 Encounters:  05/23/19 262 lb 9.6 oz (119.1 kg)  05/21/19 259 lb 8 oz (117.7 kg)  04/09/19 250 lb (113.4 kg)         amb obese bf with voice fatigue   HEENT : pt wearing mask not removed for exam due to covid -19 concerns.    NECK :  without JVD/Nodes/TM/ nl carotid upstrokes bilaterally   LUNGS: no acc muscle use,  Nl contour chest which is clear to A and P bilaterally without cough on insp or exp maneuvers   CV:  RRR  no s3 or murmur or increase in P2, and no edema   ABD:  soft and nontender with nl inspiratory excursion in the supine position. No bruits or organomegaly appreciated, bowel sounds nl  MS:  Nl gait/ ext warm without deformities, calf tenderness, cyanosis or clubbing No obvious joint restrictions   SKIN: warm and dry without lesions    NEURO:  alert, approp, nl sensorium with  no motor or cerebellar deficits apparent.       Labs ordered but did not go 05/23/2019      I personally reviewed images and agree with radiology impression  as follows:  CXR:  PA and Lat  03/21/19  Unremarkable radiographs of the chest.                 Assessment   DOE (dyspnea on exertion) Onset with covid 19 infection symptoms started 01/16/19  -  05/23/2019   Walked RA  3 laps @ approx 274ft each @ moderat pace  stopped due to end of study,  no limiting sob/ sats 100% at end despite reported sob at rest  Symptoms at  rest s reproduction / exac walking =  disproportionate to objective findings and not clear to what extent this is actually a pulmonary  problem but pt does appear to have difficult to sort out respiratory symptoms of unknown origin for which  DDX  = almost all start with A and  include Adherence, Ace Inhibitors, Acid Reflux, Active Sinus Disease, Alpha 1 Antitripsin deficiency, Anxiety masquerading as Airways dz,  ABPA,  Allergy(esp in young), Aspiration (esp in elderly), Adverse effects of meds,  Active smoking or Vaping, A bunch of PE's/clot burden (a few small clots can't cause this syndrome unless there is already severe underlying pulm or vascular dz with poor reserve),  Anemia or thyroid disorder, plus two Bs  = Bronchiectasis and Beta blocker use..and one C= CHF    Adherence is always the initial "prime suspect" and is a multilayered concern that requires a "trust but verify" approach in every patient - starting with knowing how to use medications, especially inhalers, correctly, keeping up with refills and understanding the fundamental difference between maintenance and prns vs those medications only taken for a very short course and then stopped and not refilled.  - return with all meds in hand using a trust but verify approach to confirm accurate Medication  Reconciliation The principal here is that until we are certain that the  patients are doing what we've asked, it makes no sense to ask them to do more.   ? Acid (or non-acid) GERD > always difficult to exclude as up to 75% of pts in some series report no assoc GI/  Heartburn symptoms> rec max (24h)  acid suppression and diet restrictions/ reviewed and instructions given in writing.    ? Allergy/asthma  > check IgE / Eos/  depomedrol 120 mg IM  - 05/23/2019  After extensive coaching inhaler device,  effectiveness =    25% from baseline of 0 - rec Try albuterol 15 min before an activity that you know would make you short of breath and see if it makes any difference and if makes none then don't take it after activity unless you can't catch your breath.      ? Anxiety/depression/ deconditioning /wt gain > usually at the bottom of this list of usual suspects but   may interfere with adherence and also interpretation of response or lack thereof to symptom management which can be quite subjective.   ? Anemia/ thyroid dz > check labs  ? A bunch of PE's > very unlikely based on today's walking study  ? Adverse effects of meds > none of the usual suspects listed  ? chf > check bnp to be complete     Upper airway cough syndrome Of the three most common causes of  Sub-acute / recurrent or chronic cough, only one (GERD)  can actually contribute to/ trigger  the other two (asthma and post nasal drip syndrome)  and perpetuate the cylce of cough.  While not intuitively obvious, many patients with chronic low grade reflux do not cough until there is a primary insult that disturbs the protective epithelial barrier and exposes sensitive nerve endings.   This is typically viral but can due to PNDS and  either may apply here.   The point is that once this occurs, it is difficult to eliminate the cycle  using anything but a maximally effective acid suppression regimen at least in the short run, accompanied by an appropriate diet to address non acid GERD and control / eliminate the  cough itself for at least 3 days with max tessalon and  Add tramadol if this is not effective with strict voice rest as well (no work exposure).      Morbid obesity (HCC) Body mass index is  48.03 kg/m.  -  Trending up  Lab Results  Component Value Date   TSH 3.600 08/25/2018     Contributing to gerd risk/ doe/reviewed the need and the process to achieve and maintain neg calorie balance > defer f/u primary care including intermittently monitoring thyroid status            Each maintenance medication was reviewed in detail including emphasizing most importantly the difference between maintenance and prns and under what circumstances the prns are to be triggered using an action plan format where appropriate.  Total time for H and P, chart review, counseling, teaching device/ directly observing portions of ambulatory 02 saturation study/  and generating customized AVS unique to this office visit / charting = 60 min          Sandrea Hughs, MD 05/23/2019

## 2019-05-23 NOTE — Patient Instructions (Addendum)
Increase tessalon to 200 mg three times daily until no longer coughing, then take as needed   Depomedrol 120 mg IM today   prilosec 20mg  x 2  30 min before breakfast  and x 2 before supper   GERD (REFLUX)  is an extremely common cause of respiratory symptoms just like yours , many times with no obvious heartburn at all.    It can be treated with medication, but also with lifestyle changes including elevation of the head of your bed (ideally with 6 -8inch blocks under the headboard of your bed),  Smoking cessation, avoidance of late meals, excessive alcohol, and avoid fatty foods, chocolate, peppermint, colas, red wine, and acidic juices such as orange juice.  NO MINT OR MENTHOL PRODUCTS SO NO COUGH DROPS  USE SUGARLESS CANDY INSTEAD (Jolley ranchers or Stover's or Life Savers) or even ice chips will also do - the key is to swallow to prevent all throat clearing. NO OIL BASED VITAMINS - use powdered substitutes.  Avoid fish oil when coughing.  Please remember to go to the lab department [address] [facility])   for your tests - we will call you with the results when they are available and send Dr [facility] a copy  Please schedule a follow up office visit in 2 weeks, sooner if needed

## 2019-05-24 ENCOUNTER — Other Ambulatory Visit (INDEPENDENT_AMBULATORY_CARE_PROVIDER_SITE_OTHER): Payer: BC Managed Care – PPO

## 2019-05-24 ENCOUNTER — Telehealth: Payer: Self-pay | Admitting: *Deleted

## 2019-05-24 ENCOUNTER — Encounter: Payer: Self-pay | Admitting: Internal Medicine

## 2019-05-24 DIAGNOSIS — R0600 Dyspnea, unspecified: Secondary | ICD-10-CM | POA: Diagnosis not present

## 2019-05-24 DIAGNOSIS — R0609 Other forms of dyspnea: Secondary | ICD-10-CM

## 2019-05-24 LAB — BASIC METABOLIC PANEL
BUN: 25 mg/dL — ABNORMAL HIGH (ref 6–23)
CO2: 26 mEq/L (ref 19–32)
Calcium: 8.7 mg/dL (ref 8.4–10.5)
Chloride: 100 mEq/L (ref 96–112)
Creatinine, Ser: 1.41 mg/dL — ABNORMAL HIGH (ref 0.40–1.20)
GFR: 51.12 mL/min — ABNORMAL LOW (ref >60.00)
Glucose, Bld: 237 mg/dL — ABNORMAL HIGH (ref 70–99)
Potassium: 3.9 mEq/L (ref 3.5–5.1)
Sodium: 134 mEq/L — ABNORMAL LOW (ref 135–145)

## 2019-05-24 LAB — CBC WITH DIFFERENTIAL/PLATELET
Basophils Absolute: 0.1 10*3/uL (ref 0.0–0.1)
Basophils Relative: 2 % (ref 0.0–3.0)
Eosinophils Absolute: 0.2 10*3/uL (ref 0.0–0.7)
Eosinophils Relative: 2.7 % (ref 0.0–5.0)
HCT: 32.9 % — ABNORMAL LOW (ref 36.0–46.0)
Hemoglobin: 10.8 g/dL — ABNORMAL LOW (ref 12.0–15.0)
Lymphocytes Relative: 27.1 % (ref 12.0–46.0)
Lymphs Abs: 1.8 10*3/uL (ref 0.7–4.0)
MCHC: 33 g/dL (ref 30.0–36.0)
MCV: 83.8 fl (ref 78.0–100.0)
Monocytes Absolute: 0.4 10*3/uL (ref 0.1–1.0)
Monocytes Relative: 6.5 % (ref 3.0–12.0)
Neutro Abs: 4.1 10*3/uL (ref 1.4–7.7)
Neutrophils Relative %: 61.7 % (ref 43.0–77.0)
Platelets: 415 10*3/uL — ABNORMAL HIGH (ref 150.0–400.0)
RBC: 3.92 Mil/uL (ref 3.87–5.11)
RDW: 14.2 % (ref 11.5–15.5)
WBC: 6.7 10*3/uL (ref 4.0–10.5)

## 2019-05-24 LAB — SEDIMENTATION RATE: Sed Rate: 104 mm/hr — ABNORMAL HIGH (ref 0–20)

## 2019-05-24 LAB — D-DIMER, QUANTITATIVE: D-Dimer, Quant: 0.63 mcg/mL FEU — ABNORMAL HIGH (ref <0.50)

## 2019-05-24 LAB — TSH: TSH: 2.46 u[IU]/mL (ref 0.35–4.50)

## 2019-05-24 LAB — BRAIN NATRIURETIC PEPTIDE: Pro B Natriuretic peptide (BNP): 27 pg/mL (ref 0.0–100.0)

## 2019-05-24 NOTE — Assessment & Plan Note (Addendum)
Onset with covid 19 infection symptoms started 01/16/19  -  05/23/2019   Walked RA  3 laps @ approx 244ft each @ moderat pace  stopped due to end of study,  no limiting sob/ sats 100% at end despite reported sob at rest  Symptoms at rest s reproduction / exac walking =  disproportionate to objective findings and not clear to what extent this is actually a pulmonary  problem but pt does appear to have difficult to sort out respiratory symptoms of unknown origin for which  DDX  = almost all start with A and  include Adherence, Ace Inhibitors, Acid Reflux, Active Sinus Disease, Alpha 1 Antitripsin deficiency, Anxiety masquerading as Airways dz,  ABPA,  Allergy(esp in young), Aspiration (esp in elderly), Adverse effects of meds,  Active smoking or Vaping, A bunch of PE's/clot burden (a few small clots can't cause this syndrome unless there is already severe underlying pulm or vascular dz with poor reserve),  Anemia or thyroid disorder, plus two Bs  = Bronchiectasis and Beta blocker use..and one C= CHF    Adherence is always the initial "prime suspect" and is a multilayered concern that requires a "trust but verify" approach in every patient - starting with knowing how to use medications, especially inhalers, correctly, keeping up with refills and understanding the fundamental difference between maintenance and prns vs those medications only taken for a very short course and then stopped and not refilled.  - return with all meds in hand using a trust but verify approach to confirm accurate Medication  Reconciliation The principal here is that until we are certain that the  patients are doing what we've asked, it makes no sense to ask them to do more.   ? Acid (or non-acid) GERD > always difficult to exclude as up to 75% of pts in some series report no assoc GI/ Heartburn symptoms> rec max (24h)  acid suppression and diet restrictions/ reviewed and instructions given in writing.    ? Allergy/asthma  > check IgE /  Eos/  depomedrol 120 mg IM  - 05/23/2019  After extensive coaching inhaler device,  effectiveness =    25% from baseline of 0 - rec Try albuterol 15 min before an activity that you know would make you short of breath and see if it makes any difference and if makes none then don't take it after activity unless you can't catch your breath.      ? Anxiety/depression/ deconditioning /wt gain > usually at the bottom of this list of usual suspects but   may interfere with adherence and also interpretation of response or lack thereof to symptom management which can be quite subjective.   ? Anemia/ thyroid dz > check labs  ? A bunch of PE's > very unlikely based on today's walking study  ? Adverse effects of meds > none of the usual suspects listed  ? chf > check bnp to be complete

## 2019-05-24 NOTE — Telephone Encounter (Signed)
-----   Message from Nyoka Cowden, MD sent at 05/24/2019  6:17 AM EDT ----- Did not go to lab at [facility] as req

## 2019-05-24 NOTE — Telephone Encounter (Signed)
Spoke with the pt  She went and had her labs  Nothing further needed

## 2019-05-24 NOTE — Assessment & Plan Note (Addendum)
Of the three most common causes of  Sub-acute / recurrent or chronic cough, only one (GERD)  can actually contribute to/ trigger  the other two (asthma and post nasal drip syndrome)  and perpetuate the cylce of cough.  While not intuitively obvious, many patients with chronic low grade reflux do not cough until there is a primary insult that disturbs the protective epithelial barrier and exposes sensitive nerve endings.   This is typically viral but can due to PNDS and  either may apply here.   The point is that once this occurs, it is difficult to eliminate the cycle  using anything but a maximally effective acid suppression regimen at least in the short run, accompanied by an appropriate diet to address non acid GERD and control / eliminate the cough itself for at least 3 days with max tessalon and  Add tramadol if this is not effective with strict voice rest as well (no work exposure).

## 2019-05-24 NOTE — Assessment & Plan Note (Signed)
Body mass index is 48.03 kg/m.  -  Trending up  Lab Results  Component Value Date   TSH 3.600 08/25/2018     Contributing to gerd risk/ doe/reviewed the need and the process to achieve and maintain neg calorie balance > defer f/u primary care including intermittently monitoring thyroid status            Each maintenance medication was reviewed in detail including emphasizing most importantly the difference between maintenance and prns and under what circumstances the prns are to be triggered using an action plan format where appropriate.  Total time for H and P, chart review, counseling, teaching device/ directly observing portions of ambulatory 02 saturation study/  and generating customized AVS unique to this office visit / charting = 60 min

## 2019-05-24 NOTE — Telephone Encounter (Signed)
LMTCB

## 2019-05-24 NOTE — Telephone Encounter (Signed)
Pt returning a phone call. Pt can be reached at [phone].

## 2019-05-25 ENCOUNTER — Encounter: Payer: Self-pay | Admitting: Family Medicine

## 2019-06-04 ENCOUNTER — Encounter (INDEPENDENT_AMBULATORY_CARE_PROVIDER_SITE_OTHER): Payer: BC Managed Care – PPO | Admitting: Ophthalmology

## 2019-06-04 NOTE — Telephone Encounter (Signed)
Form completed and placed in RN folder on Friday, May 21.

## 2019-06-04 NOTE — Telephone Encounter (Signed)
Form faxed to [facility], copy placed for batch scanning, and copy left up front for patient.

## 2019-06-19 ENCOUNTER — Encounter: Payer: Self-pay | Admitting: Family Medicine

## 2019-06-19 DIAGNOSIS — E113411 Type 2 diabetes mellitus with severe nonproliferative diabetic retinopathy with macular edema, right eye: Secondary | ICD-10-CM | POA: Diagnosis not present

## 2019-06-19 DIAGNOSIS — H04123 Dry eye syndrome of bilateral lacrimal glands: Secondary | ICD-10-CM | POA: Diagnosis not present

## 2019-06-19 DIAGNOSIS — E113512 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, left eye: Secondary | ICD-10-CM | POA: Diagnosis not present

## 2019-06-19 DIAGNOSIS — H1045 Other chronic allergic conjunctivitis: Secondary | ICD-10-CM | POA: Diagnosis not present

## 2019-06-19 LAB — HM DIABETES EYE EXAM

## 2019-06-20 ENCOUNTER — Ambulatory Visit (INDEPENDENT_AMBULATORY_CARE_PROVIDER_SITE_OTHER): Payer: BC Managed Care – PPO | Admitting: Ophthalmology

## 2019-06-20 ENCOUNTER — Telehealth: Payer: Self-pay | Admitting: Family Medicine

## 2019-06-20 ENCOUNTER — Other Ambulatory Visit: Payer: Self-pay

## 2019-06-20 ENCOUNTER — Encounter (INDEPENDENT_AMBULATORY_CARE_PROVIDER_SITE_OTHER): Payer: Self-pay | Admitting: Ophthalmology

## 2019-06-20 DIAGNOSIS — E113591 Type 2 diabetes mellitus with proliferative diabetic retinopathy without macular edema, right eye: Secondary | ICD-10-CM

## 2019-06-20 DIAGNOSIS — E113592 Type 2 diabetes mellitus with proliferative diabetic retinopathy without macular edema, left eye: Secondary | ICD-10-CM

## 2019-06-20 DIAGNOSIS — H4312 Vitreous hemorrhage, left eye: Secondary | ICD-10-CM | POA: Diagnosis not present

## 2019-06-20 NOTE — Assessment & Plan Note (Signed)
The nature of the vitreous hemorrhage was discussed with the patient as well as the common causes.   Patients with diabetic eye disease may develop retinal neovascularization.  Other eye conditions develop retinal  neovascularization secondary to retinal venous occlusions.   Vitreous hemorrhage may result from spontaneous vitreous detachment or retinal breaks.  Blunt trauma is a common cause as wll.  The need for serial evaluation of the fundus until  clear views are obtained was addressed. An occasional need  to monitor the condition by in office  B-scan ultrasonography in the case of dense vitreous hemorrhage was discussed.  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

## 2019-06-20 NOTE — Assessment & Plan Note (Addendum)
The nature of proliferative diabetic retinopathy was discussed with the patient as well as the potential for a vitreous hemorrhage and traction on the retina. Tight control of glucose, blood pressure, and serum lipids was recommended in order to slow further progression of disease. Cessation of smoking was recommended. Treatment options including ocular injectable medications, panretinal photocoagulation and/or vitrectomy surgery for advanced disease were discussed. Local medical therapy may slow or arrest progression of disease.  OS is a treatment nave eye with dense premacular hemorrhage and a risk of macular retinal detachment.  Aggressive therapy will be needed to control the retinopathy at this time prior to undertaking surgical correction to remove the hemorrhage.  The nature of diabetic retinopathy was explained using the following analogy: "Retinopathy develops in the body's blood supply like salty water corrodes the linings of pipes in a house, until rust appears, then holes in the pipes develop which leak followed by destruction and loss of the pipes as the corrosion turns them to dust. In a similar fashion, Diabetes damages the blood supply of the body by cumulative long--term elevated blood sugar, which corrodes the blood supply in the body, particularly the blood vessels supplying the retina, kidneys, and nerves".  Thus, control of blood sugar, slows the progression of the corrosive effect of diabetes mellitus.  Discussed that the next year will be a busy year with therapy.

## 2019-06-20 NOTE — Telephone Encounter (Signed)
Patient would like for Dr. Frances Furbish to call her to discuss the busted blood vessel in her eye. She said she is having it treated but would like to update Dr. Frances Furbish. She said she might need a referral. I offered her an appointment but she said she does not feel she needs one.   Please call patient to discuss  [phone]

## 2019-06-20 NOTE — Assessment & Plan Note (Signed)

## 2019-06-20 NOTE — Progress Notes (Signed)
06/20/2019     CHIEF COMPLAINT Patient presents for Retina Evaluation   HISTORY OF PRESENT ILLNESS: Karina [name] is a 34 y.o. female who presents to the clinic today for:   HPI    Retina Evaluation    In left eye.  This started 3 days ago.  Duration of 3 days.  Associated Symptoms Blind Spot.  Context:  mid-range vision, near vision and distance vision.  Treatments tried include no treatments.          Comments    Retina Eval Vit Heme OS - Ref'd by Dr. Juliann Pulse  Pt c/o blood spot in VA OS x 3 days. Pt sts symptoms came on suddenly Sunday OS. Pt sts she is able to see peripherally, but not centrally. Pt sts VA OD is stable. Pt sts when she looks into bright light, she sees red OS. Pt sts she had pulsating sensation OS after her vaccine. LBS: 245 in office today A1c: pt sts has not checked in "quite some time" but "probably off the charts" "12 or 13 x 1 year ago"       Last edited by Karina [name], COA on 06/20/2019  8:46 AM. (History)      Referring physician: [name], MD No address on file  HISTORICAL INFORMATION:   Selected notes from the MEDICAL RECORD NUMBER    Lab Results  Component Value Date   HGBA1C 14.3 (A) 10/27/2018     CURRENT MEDICATIONS: No current outpatient medications on file. (Ophthalmic Drugs)   No current facility-administered medications for this visit. (Ophthalmic Drugs)   Current Outpatient Medications (Other)  Medication Sig  . albuterol (VENTOLIN HFA) 108 (90 Base) MCG/ACT inhaler Inhale 2 puffs into the lungs every 4 (four) hours as needed for wheezing or shortness of breath.  . benzonatate (TESSALON) 200 MG capsule Take three times a day until no longer coughing, then take as needed  . Continuous Blood Gluc Receiver (DEXCOM G6 RECEIVER) DEVI 1 Device by Does not apply route See admin instructions.  . Continuous Blood Gluc Sensor (DEXCOM G6 SENSOR) MISC 1 each by Does not apply route See admin instructions. Apply new  sensor every 10 days.  . Continuous Blood Gluc Transmit (DEXCOM G6 TRANSMITTER) MISC 1 each by Does not apply route every 3 (three) months.  . fluticasone (FLONASE) 50 MCG/ACT nasal spray Place 2 sprays into both nostrils daily.  [name] ibuprofen (ADVIL) 600 MG tablet Take 1 tablet (600 mg total) by mouth 3 (three) times daily.  . insulin degludec (TRESIBA FLEXTOUCH) 200 UNIT/ML FlexTouch Pen Inject 50 Units into the skin daily.  . insulin lispro (HUMALOG KWIKPEN) 100 UNIT/ML KwikPen Inject 0.2-0.28 mLs (20-28 Units total) into the skin 3 (three) times daily.  . Insulin Pen Needle (B-D ULTRAFINE III SHORT PEN) 31G X 8 MM MISC 1 Container by Does not apply route as needed.  . Insulin Syringe-Needle U-100 (ULTICARE INSULIN SYRINGE) 31G X 5/16" 1 ML MISC Use to inject insulin.  [name] losartan (COZAAR) 50 MG tablet Take 1 tablet (50 mg total) by mouth at bedtime.  [name] omeprazole (PRILOSEC) 20 MG capsule Take 1 capsule (20 mg total) by mouth daily.   No current facility-administered medications for this visit. (Other)      REVIEW OF SYSTEMS:    ALLERGIES Allergies  Allergen Reactions  . Lisinopril Cough    PAST MEDICAL HISTORY Past Medical History:  Diagnosis Date  . Acute pain of right shoulder 03/13/2019  . Back pain  12/12/2017  . Carpal tunnel syndrome of right wrist 03/13/2019  . Depression 12/27/2012  . Diabetes mellitus    Type 2, insulin resistant  . Diabetic neuropathy, type II diabetes mellitus (HCC) 05/23/2013  . DOE (dyspnea on exertion) 05/21/2019   Onset with covid 19 infection symptoms started 01/16/19  -  05/23/2019   Walked RA  3 laps @ approx 265ft each @ moderat pace  stopped due to end of study,  no limiting sob/ sats 100% at end despite reported sob at rest   . Essential hypertension, benign 12/12/2013  . History of COVID-19 03/22/2019  . Lower extremity edema 08/29/2017  . Moderate nonproliferative diabetic retinopathy (HCC) 05/24/2013   Both OS and OD.  Diagnosed by Dr. Altamease Oiler,  MD, PhD 05/23/13. Also mild macular edema. Follows with Dr. Dione Booze  . Morbid obesity (HCC) 05/25/2013  . Nephrotic syndrome due to diabetes mellitus (HCC) 04/10/2019  . 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 Shortness of breath 03/22/2019  . Syncope 04/08/2015  . Tachycardia 05/21/2019  . 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 Upper airway cough syndrome 03/22/2019   Past Surgical History:  Procedure Laterality Date  . CERVICAL BIOPSY  2004  . CESAREAN SECTION N/A 06/02/2012   Procedure: CESAREAN SECTION;  Surgeon: Catalina Antigua, MD;  Location: [facility];  Service: Obstetrics;  Laterality: N/A;    FAMILY HISTORY Family History  Problem Relation Age of Onset  . Diabetes Father   . Cancer Mother   . Diabetes Mother     SOCIAL HISTORY Social History   Tobacco Use  . Smoking status: Never Smoker  . Smokeless tobacco: Never Used  Substance Use Topics  . Alcohol use: No  . Drug use: No         OPHTHALMIC EXAM:  Base Eye Exam    Visual Acuity (ETDRS)      Right Left   Dist [place] 20/30 +2 20/100 peripherally -3   Dist ph [place] 20/20 -1 NI       Tonometry (Tonopen, 8:52 AM)      Right Left   Pressure 13 16       Pupils      Pupils Dark Light Shape React APD   Right PERRL 4 3 Round Brisk None   Left PERRL 4 3 Round Brisk None       Visual Fields      Left Right     Full   Restrictions Partial inner superior temporal, inferior temporal, superior nasal, inferior nasal deficiencies        Extraocular Movement      Right Left    Full Full       Neuro/Psych    Oriented x3: Yes   Mood/Affect: Normal       Dilation    Both eyes: 1.0% Mydriacyl, 2.5% Phenylephrine @ 8:52 AM        Slit Lamp and Fundus Exam    External Exam      Right Left   External Normal Normal          IMAGING AND PROCEDURES  Imaging and Procedures for 06/20/19  OCT, Retina -  OU - Both Eyes       Right Eye Quality was good. Scan locations included subfoveal. Central Foveal Thickness: 237. Findings include vitreomacular adhesion .   Left Eye Quality was good. Scan locations  included subfoveal. Findings include abnormal foveal contour.   Notes Minor CSME OD       Color Fundus Photography Optos - OU - Both Eyes       Right Eye Progression has no prior data. Disc findings include neovascularization. Macula : microaneurysms. Vessels : Neovascularization. Periphery : neovascularization.   Left Eye Progression has no prior data. Disc findings include neovascularization. Vessels : Neovascularization. Periphery : hemorrhage, neovascularization.   Notes OD with severe proliferative diabetic retinopathy with high risk characteristics nearly 360 degrees neovascularization elsewhere and signs of extensive peripheral retinal nonperfusion with groundglass appearance of the to the retinal area  OS, large premacular subhyaloid hemorrhage covering the macular nerve and inferiorly.  Massive regions of neovascularization elsewhere, capillary dropout retinal nonperfusion.       Fluorescein Angiography Optos (Transit OS)       Right Eye   Progression has no prior data. Early phase findings include leakage, microaneurysm. Mid/Late phase findings include neovascularization disc, leakage.   Left Eye   Progression has no prior data. Early phase findings include leakage, microaneurysm, neovascularization disc. Mid/Late phase findings include neovascularization disc, leakage. Choroidal neovascularization is not present.   Notes OD with severe proliferative diabetic retinopathy with high risk characteristics nearly 360 degrees neovascularization elsewhere and signs of extensive peripheral retinal nonperfusion with groundglass appearance of the to the retinal area  OS, large premacular subhyaloid hemorrhage covering the macular nerve and inferiorly.  Massive regions of  neovascularization elsewhere, capillary dropout retinal nonperfusion  Massive peripheral retinal capillary dropout and nonperfusion with near total 360 degree neovascularization elsewhere peripherally in each eye.       Panretinal Photocoagulation - OS - Left Eye       Time Out Confirmed correct patient, procedure, site, and patient consented.   Anesthesia Topical anesthesia was used. Anesthetic medications included Proparacaine 0.5%.   Laser Information The type of laser was diode. Color was yellow. The duration in seconds was 0.02. The spot size was 390 microns. Laser power was 210.   Post-op The patient tolerated the procedure well. There were no complications. The patient received written and verbal post procedure care education.   Notes Peripheral PRP delivered anteriorly superiorly and temporally.  Patient began to breathe rapidly and thus the session was halted prior to treatment inferiorly                ASSESSMENT/PLAN:  Vitreous hemorrhage of left eye (HCC) The nature of the vitreous hemorrhage was discussed with the patient as well as the common causes.   Patients with diabetic eye disease may develop retinal neovascularization.  Other eye conditions develop retinal  neovascularization secondary to retinal venous occlusions.   Vitreous hemorrhage may result from spontaneous vitreous detachment or retinal breaks.  Blunt trauma is a common cause as wll.  The need for serial evaluation of the fundus until  clear views are obtained was addressed. An occasional need  to monitor the condition by in office  B-scan ultrasonography in the case of dense vitreous hemorrhage was discussed.  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 The  patient was advised to maintain good glucose, blood pressure, monitor kidney function and serum lipid control as advised by personal physician. Rare risk for reactivation of progression exist with untreated severe anemia, untreated renal failure, untreated heart  failure, and smoking. Complete avoidance of smoking was recommended. The chance of recurrent proliferative diabetic retinopathy was discussed as well as the chance of vitreous hemorrhage for which further treatments may be necessary.   Explained to the patient that the quiescent  proliferative diabetic retinopathy disease is unlikely to ever worsen.  Worsening factors would include however severe anemia, hypertension out-of-control or impending renal failure.  Proliferative diabetic retinopathy of left eye ([place]) The nature of proliferative diabetic retinopathy was discussed with the patient as well as the potential for a vitreous hemorrhage and traction on the retina. Tight control of glucose, blood pressure, and serum lipids was recommended in order to slow further progression of disease. Cessation of smoking was recommended. Treatment options including ocular injectable medications, panretinal photocoagulation and/or vitrectomy surgery for advanced disease were discussed. Local medical therapy may slow or arrest progression of disease.  OS is a treatment nave eye with dense premacular hemorrhage and a risk of macular retinal detachment.  Aggressive therapy will be needed to control the retinopathy at this time prior to undertaking surgical correction to remove the hemorrhage.  The nature of diabetic retinopathy was explained using the following analogy: "Retinopathy develops in the body's blood supply like salty water corrodes the linings of pipes in a house, until rust appears, then holes in the pipes develop which leak followed by destruction and loss of the pipes as the corrosion turns them to dust. In a similar fashion, Diabetes damages the  blood supply of the body by cumulative long--term elevated blood sugar, which corrodes the blood supply in the body, particularly the blood vessels supplying the retina, kidneys, and nerves".  Thus, control of blood sugar, slows the progression of the corrosive effect of diabetes mellitus.  Discussed that the next year will be a busy year with therapy.          ICD-10-CM   1. Proliferative diabetic retinopathy of left eye without macular edema associated with type 2 diabetes mellitus (HCC)  E11.3592 OCT, Retina - OU - Both Eyes    Color Fundus Photography Optos - OU - Both Eyes    Fluorescein Angiography Optos (Transit OS)    Panretinal Photocoagulation - OS - Left Eye  2. 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 Vitreous hemorrhage of left eye (HCC)  H43.12 Color Fundus Photography Optos - OU - Both Eyes    1.  Peripheral panretinal photocoagulation as initial step to initiate slight involutional changes to the neovascularization will be delivered today OS  2.  OD will undergo similar PRP later this week or within 1 week.  3.OS, dense premacular, with active large fronds of neovascular disease nearly 360 with extensive retinal nonperfusion from diabetic retinopathy  4.  Patient will return next week for injection of intravitreal Avastin OS prior to planned vitrectomy and removal of intravitreal, subhyaloid hemorrhage OS   Ophthalmic Meds Ordered this visit:  No orders of the defined types were placed in this encounter.      Return in about 1 day (around 06/21/2019) for OD, PRP.  There are no Patient Instructions on file for this visit.   Explained the diagnoses, plan, and follow up with the patient and they expressed understanding.  Patient expressed understanding of the importance of proper follow  up care.   Clent Demark Saniya Tranchina M.D. Diseases &  Surgery of the [facility] 06/20/19     Abbreviations: M myopia (nearsighted); A astigmatism; H hyperopia (farsighted); P presbyopia; Mrx spectacle prescription;  CTL contact lenses; OD right eye; OS left eye; OU both eyes  XT exotropia; ET esotropia; PEK punctate epithelial keratitis; PEE punctate epithelial erosions; DES dry eye syndrome; MGD meibomian gland dysfunction; ATs artificial tears; PFAT's preservative free artificial tears; NSC nuclear sclerotic cataract; PSC posterior subcapsular cataract; ERM epi-retinal membrane; PVD posterior vitreous detachment; RD retinal detachment; DM diabetes mellitus; DR diabetic retinopathy; NPDR non-proliferative diabetic retinopathy; PDR proliferative diabetic retinopathy; CSME clinically significant macular edema; DME diabetic macular edema; dbh dot blot hemorrhages; CWS cotton wool spot; POAG primary open angle glaucoma; C/D cup-to-disc ratio; HVF humphrey visual field; GVF goldmann visual field; OCT optical coherence tomography; IOP intraocular pressure; BRVO Branch retinal vein occlusion; CRVO central retinal vein occlusion; CRAO central retinal artery occlusion; BRAO branch retinal artery occlusion; RT retinal tear; SB scleral buckle; PPV pars plana vitrectomy; VH Vitreous hemorrhage; PRP panretinal laser photocoagulation; IVK intravitreal kenalog; VMT vitreomacular traction; MH Macular hole;  NVD neovascularization of the disc; NVE neovascularization elsewhere; AREDS age related eye disease study; ARMD age related macular degeneration; POAG primary open angle glaucoma; EBMD epithelial/anterior basement membrane dystrophy; ACIOL anterior chamber intraocular lens; IOL intraocular lens; PCIOL posterior chamber intraocular lens; Phaco/IOL phacoemulsification with intraocular lens placement; PRK photorefractive keratectomy; LASIK laser assisted in situ keratomileusis; HTN  hypertension; DM diabetes mellitus; COPD chronic obstructive pulmonary disease

## 2019-06-21 ENCOUNTER — Other Ambulatory Visit: Payer: Self-pay

## 2019-06-21 ENCOUNTER — Ambulatory Visit (INDEPENDENT_AMBULATORY_CARE_PROVIDER_SITE_OTHER): Payer: BC Managed Care – PPO | Admitting: Ophthalmology

## 2019-06-21 ENCOUNTER — Encounter (INDEPENDENT_AMBULATORY_CARE_PROVIDER_SITE_OTHER): Payer: Self-pay | Admitting: Ophthalmology

## 2019-06-21 ENCOUNTER — Encounter: Payer: Self-pay | Admitting: Family Medicine

## 2019-06-21 ENCOUNTER — Ambulatory Visit (INDEPENDENT_AMBULATORY_CARE_PROVIDER_SITE_OTHER): Payer: BC Managed Care – PPO | Admitting: Family Medicine

## 2019-06-21 ENCOUNTER — Ambulatory Visit: Payer: BC Managed Care – PPO | Admitting: Cardiology

## 2019-06-21 VITALS — BP 128/74 | HR 97 | Wt 256.2 lb

## 2019-06-21 DIAGNOSIS — E113313 Type 2 diabetes mellitus with moderate nonproliferative diabetic retinopathy with macular edema, bilateral: Secondary | ICD-10-CM | POA: Diagnosis not present

## 2019-06-21 DIAGNOSIS — G44209 Tension-type headache, unspecified, not intractable: Secondary | ICD-10-CM

## 2019-06-21 DIAGNOSIS — H4312 Vitreous hemorrhage, left eye: Secondary | ICD-10-CM | POA: Diagnosis not present

## 2019-06-21 DIAGNOSIS — E113591 Type 2 diabetes mellitus with proliferative diabetic retinopathy without macular edema, right eye: Secondary | ICD-10-CM | POA: Diagnosis not present

## 2019-06-21 DIAGNOSIS — Z794 Long term (current) use of insulin: Secondary | ICD-10-CM | POA: Diagnosis not present

## 2019-06-21 LAB — POCT GLYCOSYLATED HEMOGLOBIN (HGB A1C): HbA1c, POC (controlled diabetic range): 12.2 % — AB (ref 0.0–7.0)

## 2019-06-21 MED ORDER — CYCLOBENZAPRINE HCL 10 MG PO TABS
10.0000 mg | ORAL_TABLET | Freq: Three times a day (TID) | ORAL | 0 refills | Status: DC | PRN
Start: 2019-06-21 — End: 2019-09-06

## 2019-06-21 NOTE — Patient Instructions (Addendum)
For your headaches today, we have prescribed you a muscle relaxer that will hopefully relieve some of the tension in the muscles of your neck and face.  Please take these 3 times a day if you can tolerate it without getting drowsy.  If it makes you too drowsy take 1 at night.  The goal is to be 3 to 4 days at least without having headaches.  If you have been taking his medication for 2 weeks and not having any improvement, please call our office to schedule another visit.  I scheduled an appointment with Dr. Frances Furbish on 6/30 to discuss diabetes control and your headache issues.  Have a great day,  Frederic Jericho, MD

## 2019-06-21 NOTE — Progress Notes (Signed)
    SUBJECTIVE:   CHIEF COMPLAINT / HPI:   Left eye blindness: Saturday night she had pain on the left cheek causing her to awaken from sleep.  She then eventually fell back to sleep but when she woke up Sunday morning the pain was gone but she couuldn't see how to have her left eye.Karina [name]  She went to an ophthalmologist on Monday who diagnosed her with vitreous hemorrhage of the left eye likely due to her diabetic retinopathy.  She has continued follow-up with them over the next few weeks.  Vision in her left eye is now minimal with a red haze covering the majority of her vision.  Both eyes hurt with extraocular movements she states.  Headaches: frontal headache.  She developed these a week before her episode of blindness last Saturday.  Was having a headache 'every day' last week.  Took tylenol for it.  Pain subsided a little bit but still throbbing. Had 1 episode of vomiting but did have nausea.  Headaches have not changed since her episode of vitreous hemorrhage on Saturday.  Mild light sensitivity causing her to use sunglasses.  Sees spots, floaters.  Headache pain is a chronic 5-6 out of 10 that ramps up to 9 or 10 out of 10 in intensity not sleeping well.    Htn: losartan 100mg  once daily.    Dm: humolog 20U tid, tresiba 50 U   PERTINENT  PMH / PSH: Diabetes mellitus, hypertension  OBJECTIVE:   BP 128/74   Pulse 97   Wt 256 lb 3.2 oz (116.2 kg)   LMP 06/12/2019   SpO2 96%   BMI 46.86 kg/m   General: Alert.  No acute distress.  Obese female, appears stated age. HEENT: Sclera normal.  No hyphema seen in the left eye.  EOMI.  Pupils equal and round.  Patient's central and peripheral vision in right eye is normal.  Patient has almost complete loss of vision in the left eye.  Patient is able to see how out of the most inferior field of her vision but otherwise cannot even differentiate objects that are 2 feet away from her.  The patient has tenderness to palpation over the forehead, temporalis  muscle (right greater than left), masseter muscle (right greater than left), insertion of the right sternocleidomastoid at the occipital bone. CV: Regular rate and rhythm, 2+ radial pulse bilaterally   ASSESSMENT/PLAN:   Vitreous hemorrhage of left eye (HCC) Patient is currently being evaluated by ophthalmology for this condition.  Vision is severely limited in the left eye.  No changes today to treatment plan.  Likely a result of her proliferative diabetic retinopathy.  Will get A1c today.  Patient advised to follow-up with PCP for diabetes management.  Tension headache Patient has tenderness to palpation over several muscles in the jaw and neck.  Her symptoms and location of headache are most consistent with tension type headache.  I do not believe the headaches are a cause or symptom of the vitreous hemorrhage she experienced on Saturday.  Advised patient to continue Tylenol, but also added a muscle relaxant for her to use scheduled up to 3 times a day until she has 2 to 3 days of headache free days before tapering off.     Sunday, MD [facility]

## 2019-06-21 NOTE — Progress Notes (Signed)
06/21/2019     CHIEF COMPLAINT Patient presents for Retina Follow Up   HISTORY OF PRESENT ILLNESS: Karina [name] is a 34 y.o. female who presents to the clinic today for:   HPI    Retina Follow Up    Patient presents with  Diabetic Retinopathy.  In right eye.  Severity is severe.  Since onset it is stable.  I, the attending physician,  performed the HPI with the patient and updated documentation appropriately.          Comments    PRP OD  Pt states she had some pain in OS after PRP.  BGL: 143       Last edited by Elyse Jarvis on 06/21/2019 10:14 AM. (History)      Referring physician: Lennox Solders, MD [address] [place],  [place] [address]  HISTORICAL INFORMATION:   Selected notes from the MEDICAL RECORD NUMBER    Lab Results  Component Value Date   HGBA1C 14.3 (A) 10/27/2018     CURRENT MEDICATIONS: No current outpatient medications on file. (Ophthalmic Drugs)   No current facility-administered medications for this visit. (Ophthalmic Drugs)   Current Outpatient Medications (Other)  Medication Sig  . albuterol (VENTOLIN HFA) 108 (90 Base) MCG/ACT inhaler Inhale 2 puffs into the lungs every 4 (four) hours as needed for wheezing or shortness of breath.  . benzonatate (TESSALON) 200 MG capsule Take three times a day until no longer coughing, then take as needed  . Continuous Blood Gluc Receiver (DEXCOM G6 RECEIVER) DEVI 1 Device by Does not apply route See admin instructions.  . Continuous Blood Gluc Sensor (DEXCOM G6 SENSOR) MISC 1 each by Does not apply route See admin instructions. Apply new sensor every 10 days.  . Continuous Blood Gluc Transmit (DEXCOM G6 TRANSMITTER) MISC 1 each by Does not apply route every 3 (three) months.  . fluticasone (FLONASE) 50 MCG/ACT nasal spray Place 2 sprays into both nostrils daily.  [name] ibuprofen (ADVIL) 600 MG tablet Take 1 tablet (600 mg total) by mouth 3 (three) times daily.  . insulin degludec (TRESIBA  FLEXTOUCH) 200 UNIT/ML FlexTouch Pen Inject 50 Units into the skin daily.  . insulin lispro (HUMALOG KWIKPEN) 100 UNIT/ML KwikPen Inject 0.2-0.28 mLs (20-28 Units total) into the skin 3 (three) times daily.  . Insulin Pen Needle (B-D ULTRAFINE III SHORT PEN) 31G X 8 MM MISC 1 Container by Does not apply route as needed.  . Insulin Syringe-Needle U-100 (ULTICARE INSULIN SYRINGE) 31G X 5/16" 1 ML MISC Use to inject insulin.  [name] losartan (COZAAR) 50 MG tablet Take 1 tablet (50 mg total) by mouth at bedtime.  [name] omeprazole (PRILOSEC) 20 MG capsule Take 1 capsule (20 mg total) by mouth daily.   No current facility-administered medications for this visit. (Other)      REVIEW OF SYSTEMS: ROS    Positive for: Endocrine   Last edited by Elyse Jarvis on 06/21/2019 10:14 AM. (History)       ALLERGIES Allergies  Allergen Reactions  . Lisinopril Cough    PAST MEDICAL HISTORY Past Medical History:  Diagnosis Date  . Acute pain of right shoulder 03/13/2019  . Back pain 12/12/2017  . Carpal tunnel syndrome of right wrist 03/13/2019  . Depression 12/27/2012  . Diabetes mellitus    Type 2, insulin resistant  . Diabetic neuropathy, type II diabetes mellitus (HCC) 05/23/2013  . DOE (dyspnea on exertion) 05/21/2019   Onset with covid 19 infection  symptoms started 01/16/19  -  05/23/2019   Walked RA  3 laps @ approx 221ft each @ moderat pace  stopped due to end of study,  no limiting sob/ sats 100% at end despite reported sob at rest   . Essential hypertension, benign 12/12/2013  . History of COVID-19 03/22/2019  . Lower extremity edema 08/29/2017  . Moderate nonproliferative diabetic retinopathy ([place]) 05/24/2013   Both OS and OD.  Diagnosed by Dr. Loyal Buba, MD, PhD 05/23/13. Also mild macular edema. Follows with Dr. Katy Fitch  . Morbid obesity ([place]) 05/25/2013  . Nephrotic syndrome due to diabetes mellitus ([place]) 04/10/2019  . 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 Shortness of breath 03/22/2019  . Syncope 04/08/2015  .  Tachycardia 05/21/2019  . 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 Upper airway cough syndrome 03/22/2019   Past Surgical History:  Procedure Laterality Date  . CERVICAL BIOPSY  2004  . CESAREAN SECTION N/A 06/02/2012   Procedure: CESAREAN SECTION;  Surgeon: Mora Bellman, MD;  Location: [facility];  Service: Obstetrics;  Laterality: N/A;    FAMILY HISTORY Family History  Problem Relation Age of Onset  . Diabetes Father   . Cancer Mother   . Diabetes Mother     SOCIAL HISTORY Social History   Tobacco Use  . Smoking status: Never Smoker  . Smokeless tobacco: Never Used  Vaping Use  . Vaping Use: Never used  Substance Use Topics  . Alcohol use: No  . Drug use: No         OPHTHALMIC EXAM:  Base Eye Exam    Visual Acuity (Snellen - Linear)      Right Left   Dist [place] 20/40 20/100 -2   Dist ph [place] 20/20 -1        Tonometry (Tonopen, 10:20 AM)      Right Left   Pressure 24 22       Pupils      Pupils Dark Light Shape React APD   Right PERRL 4 3 Round Brisk None   Left PERRL 4 3 Round Brisk None       Neuro/Psych    Oriented x3: Yes   Mood/Affect: Normal       Dilation    Right eye: 1.0% Mydriacyl, 2.5% Phenylephrine @ 10:20 AM        Slit Lamp and Fundus Exam    External Exam      Right Left   External Normal Normal          IMAGING AND PROCEDURES  Imaging and Procedures for 06/21/19  Panretinal Photocoagulation - OD - Right Eye       Time Out Confirmed correct patient, procedure, site, and patient consented.   Anesthesia Topical anesthesia was used. Anesthetic medications included Proparacaine 0.5%.   Laser Information The type of laser was diode. Color was yellow. The duration in seconds was 0.02. The spot size was 390 microns. Laser power was 180. Total spots was 771.   Post-op The patient tolerated the  procedure well. There were no complications. The patient received written and verbal post procedure care education.   Notes PRP APPLIED INFERIOR , AND NASALLY                ASSESSMENT/PLAN:  No problem-specific Assessment & Plan notes found for this encounter.      ICD-10-CM  1. 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  PRP applied OD today inferiorly on nasal  2.  Patient also was offered a return visit for next week, but she is not available.  She is to return in 10 to 12 days for likely injection of intravitreal Avastin OS followed thereafter that week by vitrectomy membrane peel and clearance of the dense premacular hemorrhage OS  3.  Ophthalmic Meds Ordered this visit:  No orders of the defined types were placed in this encounter.      Return in about 11 days (around 07/02/2019) for COLOR FP, dilate, OS, AVASTIN OCT.  There are no Patient Instructions on file for this visit.   Explained the diagnoses, plan, and follow up with the patient and they expressed understanding.  Patient expressed understanding of the importance of proper follow up care.   Alford Highland Anastacia Reinecke M.D. Diseases & Surgery of the Retina and Vitreous [facility] 06/21/19     Abbreviations: M myopia (nearsighted); A astigmatism; H hyperopia (farsighted); P presbyopia; Mrx spectacle prescription;  CTL contact lenses; OD right eye; OS left eye; OU both eyes  XT exotropia; ET esotropia; PEK punctate epithelial keratitis; PEE punctate epithelial erosions; DES dry eye syndrome; MGD meibomian gland dysfunction; ATs artificial tears; PFAT's preservative free artificial tears; NSC nuclear sclerotic cataract; PSC posterior subcapsular cataract; ERM epi-retinal membrane; PVD posterior vitreous detachment; RD retinal detachment; DM diabetes mellitus; DR  diabetic retinopathy; NPDR non-proliferative diabetic retinopathy; PDR proliferative diabetic retinopathy; CSME clinically significant macular edema; DME diabetic macular edema; dbh dot blot hemorrhages; CWS cotton wool spot; POAG primary open angle glaucoma; C/D cup-to-disc ratio; HVF humphrey visual field; GVF goldmann visual field; OCT optical coherence tomography; IOP intraocular pressure; BRVO Branch retinal vein occlusion; CRVO central retinal vein occlusion; CRAO central retinal artery occlusion; BRAO branch retinal artery occlusion; RT retinal tear; SB scleral buckle; PPV pars plana vitrectomy; VH Vitreous hemorrhage; PRP panretinal laser photocoagulation; IVK intravitreal kenalog; VMT vitreomacular traction; MH Macular hole;  NVD neovascularization of the disc; NVE neovascularization elsewhere; AREDS age related eye disease study; ARMD age related macular degeneration; POAG primary open angle glaucoma; EBMD epithelial/anterior basement membrane dystrophy; ACIOL anterior chamber intraocular lens; IOL intraocular lens; PCIOL posterior chamber intraocular lens; Phaco/IOL phacoemulsification with intraocular lens placement; PRK photorefractive keratectomy; LASIK laser assisted in situ keratomileusis; HTN hypertension; DM diabetes mellitus; COPD chronic obstructive pulmonary disease

## 2019-06-22 ENCOUNTER — Encounter: Payer: Self-pay | Admitting: Internal Medicine

## 2019-06-22 ENCOUNTER — Ambulatory Visit (INDEPENDENT_AMBULATORY_CARE_PROVIDER_SITE_OTHER): Payer: BC Managed Care – PPO | Admitting: Internal Medicine

## 2019-06-22 ENCOUNTER — Other Ambulatory Visit: Payer: Self-pay

## 2019-06-22 VITALS — BP 126/68 | HR 98 | Ht 62.0 in | Wt 256.2 lb

## 2019-06-22 DIAGNOSIS — Z8616 Personal history of COVID-19: Secondary | ICD-10-CM

## 2019-06-22 DIAGNOSIS — R0609 Other forms of dyspnea: Secondary | ICD-10-CM

## 2019-06-22 DIAGNOSIS — R079 Chest pain, unspecified: Secondary | ICD-10-CM | POA: Diagnosis not present

## 2019-06-22 DIAGNOSIS — R0600 Dyspnea, unspecified: Secondary | ICD-10-CM | POA: Diagnosis not present

## 2019-06-22 DIAGNOSIS — R072 Precordial pain: Secondary | ICD-10-CM

## 2019-06-22 DIAGNOSIS — M7989 Other specified soft tissue disorders: Secondary | ICD-10-CM | POA: Diagnosis not present

## 2019-06-22 MED ORDER — METOPROLOL TARTRATE 50 MG PO TABS
100.0000 mg | ORAL_TABLET | Freq: Once | ORAL | 0 refills | Status: DC
Start: 2019-06-22 — End: 2019-07-26

## 2019-06-22 NOTE — Telephone Encounter (Signed)
Patient is calling back again asking Dr. Frances Furbish to please call her to discuss message below.

## 2019-06-22 NOTE — Patient Instructions (Addendum)
Medication Instructions:  No Changes *If you need a refill on your cardiac medications before your next appointment, please call your pharmacy*   Lab Work: BMP 1 week prior to CCTA If you have labs (blood work) drawn today and your tests are completely normal, you will receive your results only by: [name] MyChart Message (if you have MyChart) OR . A paper copy in the mail .  Testing  Will be scheduled for Coronary CTA once authorized by insurance.  [address], [place] has requested that you have an echocardiogram. Echocardiography is a painless test that uses sound waves to create images of your heart. It provides your doctor with information about the size and shape of your heart and how well your heart's chambers and valves are working. This procedure takes approximately one hour. There are no restrictions for this procedure. And   Follow-Up: At [facility], you and your health needs are our priority.  As part of our continuing mission to provide you with exceptional heart care, we have created designated Provider Care Teams.  These Care Teams include your primary Cardiologist (physician) and Advanced Practice Providers (APPs -  Physician Assistants and Nurse Practitioners) who all work together to provide you with the care you need, when you need it.    Your next appointment:   2 month(s)  The format for your next appointment:   In Person  Provider:   You may see Dr. Margaretann Loveless       Other Instructions     Your cardiac CT will be scheduled  the below locations:   [facility] [address] [place], [place] [address] [phone]      If scheduled at [facility], please arrive at the [facility] main entrance of [facility] 30 minutes prior to test start time. Proceed to the [facility] Radiology Department (first floor) to check-in and test prep.     Please follow these instructions carefully (unless  otherwise directed):  Please have BMP 1 week prior to testing  On the Night Before the Test: . Be sure to Drink plenty of water. . Do not consume any caffeinated/decaffeinated beverages or chocolate 12 hours prior to your test. . Do not take any antihistamines 12 hours prior to your test.   On the Day of the Test: . Drink plenty of water. Do not drink any water within one hour of the test. . Do not eat any food 4 hours prior to the test. . You may take your regular medications prior to the test.  . Take metoprolol (Lopressor)'100mg'$  two hours prior to test. .  . FEMALES- please wear underwire-free bra if available        After the Test: . Drink plenty of water. . After receiving IV contrast, you may experience a mild flushed feeling. This is normal. . On occasion, you may experience a mild rash up to 24 hours after the test. This is not dangerous. If this occurs, you can take Benadryl 25 mg and increase your fluid intake. . If you experience trouble breathing, this can be serious. If it is severe call 911 IMMEDIATELY. If it is mild, please call our office. . If you take any of these medications: Glipizide/Metformin, Avandament, Glucavance, please do not take 48 hours after completing test unless otherwise instructed.   Once we have confirmed authorization from your insurance company, we will call you to set up a date and time for your  test.   For non-scheduling related questions, please contact the cardiac imaging nurse navigator should you have any questions/concerns: Marchia Bond, Cardiac Imaging Nurse Navigator [name], Interim Cardiac Imaging Nurse [place] [facility] Direct Office Dial: [phone]   For scheduling needs, including cancellations and rescheduling, please call [phone].

## 2019-06-22 NOTE — Progress Notes (Signed)
Cardiology Office Note:    Date:  06/22/2019   ID:  Karina [name], DOB 26-Mar-1985, MRN [id number]  PCP:  Wilber Oliphant, MD  Cardiologist:  No primary care provider on file.  Electrophysiologist:  None   Referring MD: Fenton Foy, NP   Chief Complaint: chest pain  History of Present Illness:    Karina [name] is a 34 y.o. female with a history of DM2 with neuropathy and retinopathy, DOE after COVID 19 infection, HTN, LE edema, obesity, syncope and tachycardia. She presents for evaluation of chest pain and DOE.   Lasix 10 mg for past 3 mo. Doubled it on her own improved symptoms. Better results on lasix. Didn't feel good off of lasix.  Inhalers help a lot pre exercise. 1 mo.   Left chest pain with exertion, with minimal activity. Pushing a cart in a store will cause more shortness of breath. Improves with rest. Lasts minutes.   Has diabetes with retinopathy, on insulin. Dx in 2004 - 17 year. Type 2 diabetes. On losartan. No statin yet.   No heart disease in family.   Past Medical History:  Diagnosis Date  . Acute pain of right shoulder 03/13/2019  . Back pain 12/12/2017  . Carpal tunnel syndrome of right wrist 03/13/2019  . Depression 12/27/2012  . Diabetes mellitus    Type 2, insulin resistant  . Diabetic neuropathy, type II diabetes mellitus ([place]) 05/23/2013  . DOE (dyspnea on exertion) 05/21/2019   Onset with covid 19 infection symptoms started 01/16/19  -  05/23/2019   Walked RA  3 laps @ approx 256f each @ moderat pace  stopped due to end of study,  no limiting sob/ sats 100% at end despite reported sob at rest   . Essential hypertension, benign 12/12/2013  . History of COVID-19 03/22/2019  . Lower extremity edema 08/29/2017  . Moderate nonproliferative diabetic retinopathy ([place] 05/24/2013   Both OS and OD.  Diagnosed by Dr. SLoyal Buba MD, PhD 05/23/13. Also mild macular edema. Follows with Dr. GKaty Fitch . Morbid obesity ([facility] 05/25/2013  . Nephrotic syndrome due to diabetes  mellitus ([facility] 04/10/2019  . 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 Shortness of breath 03/22/2019  . Syncope 04/08/2015  . Tachycardia 05/21/2019  . 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 Upper airway cough syndrome 03/22/2019    Past Surgical History:  Procedure Laterality Date  . CERVICAL BIOPSY  2004  . CESAREAN SECTION N/A 06/02/2012   Procedure: CESAREAN SECTION;  Surgeon: PMora Bellman MD;  [facility];  Service: Obstetrics;  Laterality: N/A;    Current Medications: Current Meds  Medication Sig  . albuterol (VENTOLIN HFA) 108 (90 Base) MCG/ACT inhaler Inhale 2 puffs into the lungs every 4 (four) hours as needed for wheezing or shortness of breath.  . benzonatate (TESSALON) 200 MG capsule Take three times a day until no longer coughing, then take as needed  . Continuous Blood Gluc Receiver (DEXCOM G6 RECEIVER) DEVI 1 Device by Does not apply route See admin instructions.  . Continuous Blood Gluc Sensor (DEXCOM G6 SENSOR) MISC 1 each by Does not apply route See admin instructions. Apply new sensor every 10 days.  . Continuous Blood Gluc Transmit (DEXCOM G6 TRANSMITTER) MISC 1 each by Does not apply route every 3 (three) months.  . cyclobenzaprine (FLEXERIL) 10 MG tablet Take 1 tablet (  10 mg total) by mouth 3 (three) times daily as needed for muscle spasms.  . fluticasone (FLONASE) 50 MCG/ACT nasal spray Place 2 sprays into both nostrils daily.  [name] ibuprofen (ADVIL) 600 MG tablet Take 1 tablet (600 mg total) by mouth 3 (three) times daily.  . insulin degludec (TRESIBA FLEXTOUCH) 200 UNIT/ML FlexTouch Pen Inject 50 Units into the skin daily.  . insulin lispro (HUMALOG KWIKPEN) 100 UNIT/ML KwikPen Inject 0.2-0.28 mLs (20-28 Units total) into the skin 3 (three) times daily.  . Insulin Pen Needle (B-D ULTRAFINE III SHORT PEN) 31G X 8 MM MISC 1 Container by Does not apply  route as needed.  . Insulin Syringe-Needle U-100 (ULTICARE INSULIN SYRINGE) 31G X 5/16" 1 ML MISC Use to inject insulin.  . [DISCONTINUED] losartan (COZAAR) 50 MG tablet Take 1 tablet (50 mg total) by mouth at bedtime. (Patient taking differently: Take 100 mg by mouth at bedtime. )  . [DISCONTINUED] omeprazole (PRILOSEC) 20 MG capsule Take 1 capsule (20 mg total) by mouth daily.     Allergies:   Lisinopril and Contrast media [iodinated diagnostic agents]   Social History   Socioeconomic History  . Marital status: Single    Spouse name: Not on file  . Number of children: Not on file  . Years of education: Not on file  . Highest education level: Not on file  Occupational History  . Not on file  Tobacco Use  . Smoking status: Never Smoker  . Smokeless tobacco: Never Used  Vaping Use  . Vaping Use: Never used  Substance and Sexual Activity  . Alcohol use: No  . Drug use: No  . Sexual activity: Yes    Birth control/protection: None  Other Topics Concern  . Not on file  Social History Narrative  . Not on file   Social Determinants of Health   Financial Resource Strain:   . Difficulty of Paying Living Expenses:   Food Insecurity:   . Worried About Charity fundraiser in the Last Year:   . Arboriculturist in the Last Year:   Transportation Needs:   . Film/video editor (Medical):   [name] Lack of Transportation (Non-Medical):   Physical Activity:   . Days of Exercise per Week:   . Minutes of Exercise per Session:   Stress:   . Feeling of Stress :   Social Connections:   . Frequency of Communication with Friends and Family:   . Frequency of Social Gatherings with Friends and Family:   . Attends Religious Services:   . Active Member of Clubs or Organizations:   . Attends Archivist Meetings:   [name] Marital Status:      Family History: The patient's family history includes Cancer in her mother; Diabetes in her father and mother.  ROS:   Please see the history  of present illness.    All other systems reviewed and are negative.  EKGs/Labs/Other Studies Reviewed:    The following studies were reviewed today:  EKG:   SR rate 96  Recent Labs: 08/25/2018: BNP 3.9 05/24/2019: BUN 25; Creatinine, Ser 1.41; Hemoglobin 10.8; Platelets 415.0; Potassium 3.9; Pro B Natriuretic peptide (BNP) 27.0; Sodium 134; TSH 2.46  Recent Lipid Panel    Component Value Date/Time   CHOL 208 (H) 09/14/2018 1502   TRIG 190 (H) 09/14/2018 1502   HDL 52 09/14/2018 1502   CHOLHDL 4.0 09/14/2018 1502   CHOLHDL 3.4 03/21/2015 1230   VLDL 30 03/21/2015 1230  [place] 123 (H) 09/14/2018 1502    Physical Exam:    VS:  BP 126/68   Pulse 98   Ht '5\' 2"'$  (1.575 m)   Wt 256 lb 3.2 oz (116.2 kg)   LMP 06/12/2019   BMI 46.86 kg/m     Wt Readings from Last 5 Encounters:  07/11/19 256 lb (116.1 kg)  07/05/19 261 lb (118.4 kg)  07/02/19 264 lb 12.8 oz (120.1 kg)  06/22/19 256 lb 3.2 oz (116.2 kg)  06/21/19 256 lb 3.2 oz (116.2 kg)     Constitutional: No acute distress Eyes: sclera non-icteric, normal conjunctiva and lids ENMT: normal dentition, moist mucous membranes Cardiovascular: regular rhythm, normal rate, no murmurs. S1 and S2 normal. Radial pulses normal bilaterally. No jugular venous distention.  Respiratory: clear to auscultation bilaterally GI : normal bowel sounds, soft and nontender. No distention.   MSK: extremities warm, well perfused. No edema.  NEURO: grossly nonfocal exam, moves all extremities. PSYCH: alert and oriented x 3, normal mood and affect.   ASSESSMENT:    1. DOE (dyspnea on exertion)   2. Chest pain, unspecified type   3. History of COVID-19   4. Swelling of both lower extremities   5. Precordial pain    PLAN:    DOE and chest pain in setting of DM2 with complications - she needs an evaluation for coronary artery disease given history of DM2 and exertional chest pain. We will obtain CCTA, give BB and nitro before scan for best  quality images.   DM2 - pending results of testing, may make additional management recommendations targeted at CV outcomes.   LE swelling - ok to continue lasix as it is heping. Encouraged compression stockings.    Total time of encounter: 60 minutes total time of encounter, including 35 minutes spent in face-to-face patient care on the date of this encounter. This time includes coordination of care and counseling regarding above mentioned problem list. Remainder of non-face-to-face time involved reviewing chart documents/testing relevant to the patient encounter and documentation in the medical record. I have independently reviewed documentation from referring provider.   Cherlynn Kaiser, MD [place]  [facility]    Medication Adjustments/Labs and Tests Ordered: Current medicines are reviewed at length with the patient today.  Concerns regarding medicines are outlined above.  Orders Placed This Encounter  Procedures  . CT CORONARY MORPH W/CTA COR W/SCORE W/CA W/CM &/OR WO/CM  . CT CORONARY FRACTIONAL FLOW RESERVE DATA PREP  . CT CORONARY FRACTIONAL FLOW RESERVE FLUID ANALYSIS  . Basic Metabolic Panel (BMET)  . EKG 12-Lead  . ECHOCARDIOGRAM COMPLETE   Meds ordered this encounter  Medications  . DISCONTD: metoprolol tartrate (LOPRESSOR) 50 MG tablet    Sig: Take 2 tablets (100 mg total) by mouth once for 1 dose. 2 hour prior to procedure    Dispense:  2 tablet    Refill:  0    Patient Instructions  Medication Instructions:  No Changes *If you need a refill on your cardiac medications before your next appointment, please call your pharmacy*   Lab Work: BMP 1 week prior to CCTA If you have labs (blood work) drawn today and your tests are completely normal, you will receive your results only by: [name] MyChart Message (if you have MyChart) OR . A paper copy in the mail .  Testing  Will be scheduled for Coronary CTA once authorized by insurance.  [address], [address] Your physician has requested that you  have an echocardiogram. Echocardiography is a painless test that uses sound waves to create images of your heart. It provides your doctor with information about the size and shape of your heart and how well your heart's chambers and valves are working. This procedure takes approximately one hour. There are no restrictions for this procedure. And   Follow-Up: At [facility], you and your health needs are our priority.  As part of our continuing mission to provide you with exceptional heart care, we have created designated Provider Care Teams.  These Care Teams include your primary Cardiologist (physician) and Advanced Practice Providers (APPs -  Physician Assistants and Nurse Practitioners) who all work together to provide you with the care you need, when you need it.    Your next appointment:   2 month(s)  The format for your next appointment:   In Person  Provider:   You may see Dr. Margaretann Loveless       Other Instructions     Your cardiac CT will be scheduled  the below locations:   [facility] [address] [place], [place] [address] [phone]      If scheduled at [facility], please arrive at the [facility] main entrance of [facility] 30 minutes prior to test start time. Proceed to the [facility] Radiology Department (first floor) to check-in and test prep.     Please follow these instructions carefully (unless otherwise directed):  Please have BMP 1 week prior to testing  On the Night Before the Test: . Be sure to Drink plenty of water. . Do not consume any caffeinated/decaffeinated beverages or chocolate 12 hours prior to your test. . Do not take any antihistamines 12 hours prior to your test.   On the Day of the Test: . Drink plenty of water. Do not drink any water within one hour of the test. . Do not eat any food 4 hours prior to the test. . You may take your regular  medications prior to the test.  . Take metoprolol (Lopressor)'100mg'$  two hours prior to test. .  . FEMALES- please wear underwire-free bra if available        After the Test: . Drink plenty of water. . After receiving IV contrast, you may experience a mild flushed feeling. This is normal. . On occasion, you may experience a mild rash up to 24 hours after the test. This is not dangerous. If this occurs, you can take Benadryl 25 mg and increase your fluid intake. . If you experience trouble breathing, this can be serious. If it is severe call 911 IMMEDIATELY. If it is mild, please call our office. . If you take any of these medications: Glipizide/Metformin, Avandament, Glucavance, please do not take 48 hours after completing test unless otherwise instructed.   Once we have confirmed authorization from your insurance company, we will call you to set up a date and time for your test.   For non-scheduling related questions, please contact the cardiac imaging nurse navigator should you have any questions/concerns: Marchia Bond, Cardiac Imaging Nurse Navigator [name], Interim Cardiac Imaging Nurse [facility] Direct Office Dial: [phone]   For scheduling needs, including cancellations and rescheduling, please call [phone].

## 2019-06-23 DIAGNOSIS — G44209 Tension-type headache, unspecified, not intractable: Secondary | ICD-10-CM | POA: Insufficient documentation

## 2019-06-23 NOTE — Assessment & Plan Note (Signed)
Patient has tenderness to palpation over several muscles in the jaw and neck.  Her symptoms and location of headache are most consistent with tension type headache.  I do not believe the headaches are a cause or symptom of the vitreous hemorrhage she experienced on Saturday.  Advised patient to continue Tylenol, but also added a muscle relaxant for her to use scheduled up to 3 times a day until she has 2 to 3 days of headache free days before tapering off.

## 2019-06-23 NOTE — Assessment & Plan Note (Addendum)
Patient is currently being evaluated by ophthalmology for this condition.  Vision is severely limited in the left eye.  No changes today to treatment plan.  Likely a result of her proliferative diabetic retinopathy.  Will get A1c today.  Patient advised to follow-up with PCP for diabetes management.

## 2019-06-25 NOTE — Telephone Encounter (Signed)
Called Ms. Lorentz to follow-up on her eye injury.  Left a voice message saying that I had seen her notes with the ophthalmologist and I am glad that she has been getting treatment.  I asked her to call back if she would like, but she does not have to.  I look forward to seeing her at her appointment with me in about 2 weeks.

## 2019-07-02 ENCOUNTER — Encounter (INDEPENDENT_AMBULATORY_CARE_PROVIDER_SITE_OTHER): Payer: BC Managed Care – PPO | Admitting: Ophthalmology

## 2019-07-02 ENCOUNTER — Ambulatory Visit (INDEPENDENT_AMBULATORY_CARE_PROVIDER_SITE_OTHER): Payer: BC Managed Care – PPO | Admitting: Internal Medicine

## 2019-07-02 ENCOUNTER — Other Ambulatory Visit: Payer: Self-pay

## 2019-07-02 ENCOUNTER — Encounter: Payer: Self-pay | Admitting: Internal Medicine

## 2019-07-02 DIAGNOSIS — R058 Other specified cough: Secondary | ICD-10-CM

## 2019-07-02 DIAGNOSIS — R05 Cough: Secondary | ICD-10-CM | POA: Diagnosis not present

## 2019-07-02 DIAGNOSIS — R0600 Dyspnea, unspecified: Secondary | ICD-10-CM

## 2019-07-02 DIAGNOSIS — R0609 Other forms of dyspnea: Secondary | ICD-10-CM

## 2019-07-02 MED ORDER — PANTOPRAZOLE SODIUM 40 MG PO TBEC
DELAYED_RELEASE_TABLET | ORAL | 2 refills | Status: DC
Start: 2019-07-02 — End: 2019-11-16

## 2019-07-02 NOTE — Patient Instructions (Addendum)
protonix 40 mg Take 30- 60 min before your first and last meals of the day   Try albuterol 15 min before an activity that you know would make you short of breath and see if it makes any difference and if makes none then don't take it after activity unless you can't catch your breath.     We will set you up with one of our sleep doctors next available and stay off your back in the meantime

## 2019-07-02 NOTE — Progress Notes (Signed)
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      History of Present Illness  05/23/2019  Pulmonary/ 1st office eval/Treyce Spillers  Chief Complaint  Patient presents with  . Pulmonary Consult    SOb X 4 month, coughing without phlegm, chest burn when coughing, post covid   Dyspnea:  Onset at rest lasts   sev minutes even if not coughing / assoc with chest pressure not necessarily related cough/ no nausea/ no diaphoresis  Cough: dry hacking pattern day and night but worese talking, eating  Sleep: on back since covid can't breath/ pillows get her up 30 degrees but bed is flat  SABA use: ventolin ? Helps if takes before ex  Prednisone helped some  rec Increase tessalon to 200 mg three times daily until no longer coughing, then take as needed  Depomedrol 120 mg IM today  Prilosec 20mg  x 2  30 min before breakfast  and x 2 before supper  GERD  Diet   07/02/2019  f/u ov/Bolton Canupp re:  Chief Complaint  Patient presents with  . Follow-up    SOB on exertion and while asleep. Family says she snores. Can exersise a bit longer than usual.    Dyspnea:  Ok at rest now / only doe with steps or if gets in hurry  Cough: much better  Sleeping: no better with freq awakening despite bed blocks/ moderate daytime drowsiness  SABA use: before ex  02: none    No obvious day to day or daytime variability or assoc excess/ purulent sputum or mucus plugs or hemoptysis or cp or chest tightness, subjective wheeze or overt sinus or hb symptoms.    Also denies any obvious fluctuation of symptoms with weather or environmental changes or  other aggravating or alleviating factors except as outlined above   No unusual exposure hx or h/o childhood pna/ asthma or knowledge of premature birth.  Current Allergies, Complete Past Medical History, Past Surgical History, Family History, and Social History were reviewed in 07/04/2019 record.  ROS  The following are not active complaints unless bolded Hoarseness, sore throat, dysphagia, dental problems, itching, sneezing,  nasal congestion or discharge of excess mucus or purulent secretions, ear ache,   fever, chills, sweats, unintended wt loss or wt gain, classically pleuritic or exertional cp,  orthopnea pnd or arm/hand swelling  or leg swelling, presyncope, palpitations, abdominal pain, anorexia, nausea, vomiting, diarrhea  or change in bowel habits or change in bladder habits, change in stools or change in urine, dysuria, hematuria,  rash, arthralgias, visual complaints, headache, numbness, weakness or ataxia or problems with walking or coordination,  change in mood or  memory.        Current Meds  Medication Sig  . albuterol (VENTOLIN HFA) 108 (90 Base) MCG/ACT inhaler Inhale 2 puffs into the lungs every 4 (four) hours as needed for wheezing or shortness of breath.  . benzonatate (TESSALON) 200 MG capsule Take three times a day until no longer coughing, then take as needed  . Continuous Blood Gluc Receiver (DEXCOM G6  RECEIVER) DEVI 1 Device by Does not apply route See admin instructions.  . Continuous Blood Gluc Sensor (DEXCOM G6 SENSOR) MISC 1 each by Does not apply route See admin instructions. Apply new sensor every 10 days.  . Continuous Blood Gluc Transmit (DEXCOM G6 TRANSMITTER) MISC 1 each by Does not apply route every 3 (three) months.  . fluticasone (FLONASE) 50 MCG/ACT nasal spray Place 2 sprays into both nostrils daily.  [name] ibuprofen (ADVIL) 600 MG tablet Take 1 tablet (600 mg total) by mouth 3 (three) times daily.  . insulin degludec (TRESIBA FLEXTOUCH)  200 UNIT/ML FlexTouch Pen Inject 50 Units into the skin daily.  . insulin lispro (HUMALOG KWIKPEN) 100 UNIT/ML KwikPen Inject 0.2-0.28 mLs (20-28 Units total) into the skin 3 (three) times daily.  . Insulin Pen Needle (B-D ULTRAFINE III SHORT PEN) 31G X 8 MM MISC 1 Container by Does not apply route as needed.  . Insulin Syringe-Needle U-100 (ULTICARE INSULIN SYRINGE) 31G X 5/16" 1 ML MISC Use to inject insulin.  [name] losartan (COZAAR) 100 MG tablet Take 100 mg by mouth daily.  [name] omeprazole (PRILOSEC) 20 MG capsule Take 1 capsule (20 mg total) by mouth daily.  . [DISCONTINUED] losartan (COZAAR) 50 MG tablet Take 1 tablet (50 mg total) by mouth at bedtime. (Patient taking differently: Take 100 mg by mouth at bedtime. )                Past Medical History:  Diagnosis Date  . Diabetes mellitus    Type 2, insulin resistant  . Syncope 04/08/2015      Objective:     amb obese bf nad  Vital signs reviewed  07/02/2019  - Note at rest 02 sats  100% on RA    07/02/2019       264   05/23/19 262 lb 9.6 oz (119.1 kg)  05/21/19 259 lb 8 oz (117.7 kg)  04/09/19 250 lb (113.4 kg)     HEENT : pt wearing mask not removed for exam due to covid -19 concerns.    NECK :  without JVD/Nodes/TM/ nl carotid upstrokes bilaterally   LUNGS: no acc muscle use,  Nl contour chest which is clear to A and P bilaterally without cough on insp or exp maneuvers   CV:  RRR  no s3 or murmur or increase in P2, and no edema   ABD:  soft and nontender with nl inspiratory excursion in the supine position. No bruits or organomegaly appreciated, bowel sounds nl  MS:  Nl gait/ ext warm without deformities, calf tenderness, cyanosis or clubbing No obvious joint restrictions   SKIN: warm and dry without lesions    NEURO:  alert, approp, nl sensorium with  no motor or cerebellar deficits apparent.                 Assessment

## 2019-07-03 ENCOUNTER — Encounter: Payer: Self-pay | Admitting: Internal Medicine

## 2019-07-03 NOTE — Assessment & Plan Note (Signed)
Body mass index is 48.43 kg/m.  -  trending up Lab Results  Component Value Date   TSH 2.46 05/24/2019     Contributing to gerd risk/ doe/reviewed the need and the process to achieve and maintain neg calorie balance > defer f/u primary care including intermittently monitoring thyroid status    Also concerned about OSA > referred to sleep medicine.  Pulmonary fu is prn          Each maintenance medication was reviewed in detail including emphasizing most importantly the difference between maintenance and prns and under what circumstances the prns are to be triggered using an action plan format where appropriate.  Total time for H and P, chart review, counseling, teaching device and generating customized AVS unique to this summary final  office visit / charting = 20 min

## 2019-07-03 NOTE — Assessment & Plan Note (Addendum)
Onset with covid 19 infection symptoms started 01/16/19  -  05/23/2019   Walked RA  3 laps @ approx 256ft each @ moderat pace  stopped due to end of study,  no limiting sob/ sats 100% at end despite reported sob at rest  No additional w/u needed for now as this appears to be mostly related to her wt   Advised re approp use of saba: I spent extra time with pt today reviewing appropriate use of albuterol for prn use on exertion with the following points: 1) saba is for relief of sob that does not improve by walking a slower pace or resting but rather if the pt does not improve after trying this first. 2) If the pt is convinced, as many are, that saba helps recover from activity faster then it's easy to tell if this is the case by re-challenging : ie stop, take the inhaler, then p 5 minutes try the exact same activity (intensity of workload) that just caused the symptoms and see if they are substantially diminished or not after saba 3) if there is an activity that reproducibly causes the symptoms, try the saba 15 min before the activity on alternate days   If in fact the saba really does help, then fine to continue to use it prn but advised may need to look closer at the maintenance regimen being used to achieve better control of airways disease with exertion.   Pulmonary f/u is prn

## 2019-07-03 NOTE — Assessment & Plan Note (Signed)
Improved though some of her noct symptoms may still be from gerd  rec protonix 40 mg bid ac and diet / bed blocks

## 2019-07-04 ENCOUNTER — Institutional Professional Consult (permissible substitution): Payer: BC Managed Care – PPO | Admitting: Internal Medicine

## 2019-07-05 ENCOUNTER — Encounter: Payer: Self-pay | Admitting: Neurology

## 2019-07-05 ENCOUNTER — Ambulatory Visit (INDEPENDENT_AMBULATORY_CARE_PROVIDER_SITE_OTHER): Payer: BC Managed Care – PPO | Admitting: Neurology

## 2019-07-05 VITALS — BP 132/70 | HR 96 | Ht 62.0 in | Wt 261.0 lb

## 2019-07-05 DIAGNOSIS — Z794 Long term (current) use of insulin: Secondary | ICD-10-CM

## 2019-07-05 DIAGNOSIS — Z6841 Body Mass Index (BMI) 40.0 and over, adult: Secondary | ICD-10-CM

## 2019-07-05 DIAGNOSIS — E093419 Drug or chemical induced diabetes mellitus with severe nonproliferative diabetic retinopathy with macular edema, unspecified eye: Secondary | ICD-10-CM | POA: Diagnosis not present

## 2019-07-05 DIAGNOSIS — G441 Vascular headache, not elsewhere classified: Secondary | ICD-10-CM

## 2019-07-05 DIAGNOSIS — E1165 Type 2 diabetes mellitus with hyperglycemia: Secondary | ICD-10-CM

## 2019-07-05 DIAGNOSIS — E113313 Type 2 diabetes mellitus with moderate nonproliferative diabetic retinopathy with macular edema, bilateral: Secondary | ICD-10-CM

## 2019-07-05 DIAGNOSIS — E113513 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, bilateral: Secondary | ICD-10-CM | POA: Insufficient documentation

## 2019-07-05 DIAGNOSIS — R519 Headache, unspecified: Secondary | ICD-10-CM | POA: Insufficient documentation

## 2019-07-05 DIAGNOSIS — I10 Essential (primary) hypertension: Secondary | ICD-10-CM

## 2019-07-05 DIAGNOSIS — E662 Morbid (severe) obesity with alveolar hypoventilation: Secondary | ICD-10-CM | POA: Insufficient documentation

## 2019-07-05 NOTE — Patient Instructions (Signed)
Obesity Hypoventilation Syndrome  Obesity hypoventilation syndrome (OHS) means that you are not breathing well enough to get air in and out of your lungs efficiently (ventilation). This causes a low oxygen level and a high carbon dioxide level in your blood (hypoventilation). Having too much total body fat (obesity) is a significant risk factor for developing OHS. OHS makes it harder for your heart to pump oxygen-rich blood to your body. It can cause sleep disturbances and make you feel sleepy during the day. Over time, OHS can increase your risk for:  Heart disease.  High blood pressure (hypertension).  Reduced ability to absorb sugar from the bloodstream (insulin resistance).  Heart failure. Over time, OHS weakens your heart and can lead to heart failure. What are the causes? The exact cause of OHS is not known. Possible causes include:  Pressure on the lungs from excess body weight.  Obesity-related changes in how much air the lungs can hold (lung capacity) and how much they can expand (lung compliance).  Failure of the brain to regulate oxygen and carbon dioxide levels properly.  Chemicals (hormones) produced by excess fat cells interfering with breathing regulation.  A breathing condition in which breathing pauses or becomes shallow during sleep (sleep apnea). This condition can eventually cause the body to ventilate poorly and to hold onto carbon dioxide during the day. What increases the risk? You may have a greater risk for OHS if you:  Have a BMI of 30 or higher. BMI is an estimate of body fat that is calculated from height and weight. For adults, a BMI of 30 or higher is considered obese.  Are 40?36 years old.  Carry most of your excess weight around your waist.  Experience moderate symptoms of sleep apnea. What are the signs or symptoms? The most common symptoms of OHS are:  Daytime sleepiness.  Lack of energy.  Shortness of breath.  Morning headaches.  Sleep  apnea.  Trouble concentrating.  Irritability, mood swings, or depression.  Swollen veins in the neck.  Swelling of the legs. How is this diagnosed? Your health care provider may suspect OHS if you are obese and have poor breathing during the day and at night. Your health care provider will also do a physical exam. You may have tests to:  Measure your BMI.  Measure your blood oxygen level with a sensor placed on your finger (pulse oximetry).  Measure blood oxygen and carbon dioxide in a blood sample.  Measure the amount of red blood cells in a blood sample. OHS causes the number of red blood cells you have to increase (polycythemia).  Check your breathing ability (pulmonary function testing).  Check your breathing ability, breathing patterns, and oxygen level while you sleep (sleep study). You may also have a chest X-ray to rule out other breathing problems. You may have an electrocardiogram (ECG) and or echocardiogram to check for signs of heart failure. How is this treated? Weight loss is the most important part of treatment for OHS, and it may be the only treatment that you need. Other treatments may include:  Using a device to open your airway while you sleep, such as a continuous positive airway pressure (CPAP) machine that delivers oxygen to your airway through a mask.  Surgery (gastric bypass surgery) to lower your BMI. This may be needed if: ? You are very obese. ? Other treatments have not worked for you. ? Your OHS is very severe and is causing organ damage, such as heart failure. Follow these   instructions at home:  Medicines  Take over-the-counter and prescription medicines only as told by your health care provider.  Ask your health care provider what medicines are safe for you. You may be told to avoid medicines that can impair breathing and make OHS worse, such as sedatives and narcotics. Sleeping habits  If you are prescribed a CPAP machine, make sure you  understand and use the machine as directed.  Try to get 8 hours of sleep every night.  Go to bed at the same time every night, and get up at the same time every day. General instructions  Work with your health care provider to make a diet and exercise plan that helps you reach and maintain a healthy weight.  Eat a healthy diet.  Avoid smoking.  Exercise regularly as told by your health care provider.  During the evening, do not drink caffeine and do not eat heavy meals.  Keep all follow-up visits as told by your health care provider. This is important. Contact a health care provider if:  You experience new or worsening shortness of breath.  You have chest pain.  You have an irregular heartbeat (palpitations).  You have dizziness.  You faint.  You develop a cough.  You have a fever.  You have chest pain when you breathe (pleurisy). This information is not intended to replace advice given to you by your health care provider. Make sure you discuss any questions you have with your health care provider. Document Revised: 04/21/2018 Document Reviewed: 06/09/2015 Elsevier Patient Education  2020 Elsevier Inc. Screening for Sleep Apnea  Sleep apnea is a condition in which breathing pauses or becomes shallow during sleep. Sleep apnea screening is a test to determine if you are at risk for sleep apnea. The test is easy and only takes a few minutes. Your health care provider may ask you to have this test in preparation for surgery or as part of a physical exam. What are the symptoms of sleep apnea? Common symptoms of sleep apnea include:  Snoring.  Restless sleep.  Daytime sleepiness.  Pauses in breathing.  Choking during sleep.  Irritability.  Forgetfulness.  Trouble thinking clearly.  Depression.  Personality changes. Most people with sleep apnea are not aware that they have it. Why should I get screened? Getting screened for sleep apnea can help:  Ensure  your safety. It is important for your health care providers to know whether or not you have sleep apnea, especially if you are having surgery or have other long-term (chronic) health conditions.  Improve your health and allow you to get a better night's rest. Restful sleep can help you: ? Have more energy. ? Lose weight. ? Improve high blood pressure. ? Improve diabetes management. ? Prevent stroke. ? Prevent car accidents. How is screening done? Screening usually includes being asked a list of questions about your sleep quality. Some questions you may be asked include:  Do you snore?  Is your sleep restless?  Do you have daytime sleepiness?  Has a partner or spouse told you that you stop breathing during sleep?  Have you had trouble concentrating or memory loss? If your screening test is positive, you are at risk for the condition. Further testing may be needed to confirm a diagnosis of sleep apnea. Where to find more information You can find screening tools online or at your health care clinic. For more information about sleep apnea screening and healthy sleep, visit these websites:  Centers for Disease Control and   Prevention: www.cdc.gov/sleep/index.html  American Sleep Apnea Association: www.sleepapnea.org Contact a health care provider if:  You think that you may have sleep apnea. Summary  Sleep apnea screening can help determine if you are at risk for sleep apnea.  It is important for your health care providers to know whether or not you have sleep apnea, especially if you are having surgery or have other chronic health conditions.  You may be asked to take a screening test for sleep apnea in preparation for surgery or as part of a physical exam. This information is not intended to replace advice given to you by your health care provider. Make sure you discuss any questions you have with your health care provider. Document Revised: 10/14/2017 Document Reviewed:  04/09/2016 Elsevier Patient Education  2020 Elsevier Inc.  

## 2019-07-05 NOTE — Progress Notes (Signed)
Provider:  Melvyn Novas, M D  Referring Provider: Lennox Solders, MD Primary Care Physician:  Lennox Solders, MD  Chief Complaint  [name] presents with  . 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 Pt is scheduled for eye surgery on 07/11/2019.   Karina [name] Headache    Pt reports daily h/a- takes otc tylenol to help treat-     HPI:  Karina [name] is a 34 y.o. female  Is seen here as a referral from [facility] .  I am seeing today Karina [name], a 34 year old African-American female with a longstanding history of diabetes mellitus and multiple ophthalmologically, vascular, retinal, renal and neurologic complications. The [name] also contracted COVID-19 and stated that she had a longstanding dry cough for which she has followed up with Karina [name].  Her diabetes is uncontrolled and has been for month and years- see epic.   She is here upon referral by her eye doctor, and looking at her last visit with the [name] her primary doctor is Karina [name] whom she has not seen since early June.  She was seen at [facility] eye care on 19 June 2019 Karina [name], Karina [name] there stating that she had since Sunday a headache but he could not find ophthalmologic changes explaining the pain and therefore decided to referral to neurology as primary care was not available.  She has recently canceled an appointment with her retinal specialist but is going to follow-up.  She has seen Karina [name] internal medicine-pulmonology.  She is in diabetic care with Karina [name] endocrinologist.  Review of systems from her last visit was good eye care state no neurologic complications or symptoms, particular [name] has a history of myopia, astigmatism, she is on Systane 4 times a day, she has macular edema (!)  moderate nonproliferative diabetic retinopathy dry eye  contributing to conjunctivitis and meibomian gland dysfunction.  She is an insulin-dependent diabetes type 2 [name].  The [name] experienced on June 6 and new symptom onset is a left-sided headache with retro-orbital pain pressure and blurring of vision.  She called her ophthalmologist and could be seen on Tuesday following 8 June.  She is wearing a eye patch he has photosensitivity and light hurts her eyes.  She is straining to see with the left. The [name]'s pupil respect bilaterally to light and accommodation, she still reports eye pain.  Unlikely that Mrs. Again he had her retinal images saved to her smart phone from June 9.  Shows clearly quite significant edematous changes, a post retinal bleed these pictures were obtained by her retinal specialist yes Karina [name].    This is a diabetic macular edema, preretinal hemorrhages subretinal fluid accumulation macular swelling.  Aside from the obvious risk factor of ocular manifestations of diabetes type 2 of the degree of control and not sure the second stabilizing treatment is to evaluate for obstructive sleep apnea and make sure that she does not have a contribution of ocular pressure from obstructive sleep apnea.  Usually Dr. Susann [name] will refer directly to [place] for a sleep study but in this case I understand that she is going to be evaluated in August (!) By Karina [name] pulmonology.  I would like to add that the  HbA1 c was 12.2 (!) on 06-21-2019.   She  clearly has a left eye vitreous hemorrhage and the bleed is overlying the macula.   She is post Covid 19 was infected in early January.     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  eye pain, retro-orbital pressure sensation, photophobia. , LOUD SNORER, morbidly obese. Non-controlled DM2.   Social History   Socioeconomic History  . Marital status: Single    Spouse name: Not on file  . Number of children: Not on file  . Years of education: Not on file  . Highest education level: Not on file  Occupational History  . Not on file  Tobacco Use  . Smoking status: Never Smoker  . Smokeless tobacco: Never Used  Vaping Use  . Vaping Use: Never used  Substance and Sexual Activity  . Alcohol use: No  . Drug use: No  . Sexual activity: Yes    Birth control/protection: None  Other Topics Concern  . Not on file  Social History Narrative  . Not on file   Social Determinants of Health   Financial Resource Strain:   . Difficulty of Paying Living Expenses:   Food Insecurity:   . Worried About Charity fundraiser in the Last Year:   . Arboriculturist in the Last Year:   Transportation Needs:   . Film/video editor (Medical):   Karina [name] Lack of Transportation (Non-Medical):   Physical Activity:   . Days of Exercise per Week:   . Minutes of Exercise per Session:   Stress:   . Feeling of Stress :   Social Connections:   . Frequency of Communication with Friends and Family:   . Frequency of Social Gatherings with Friends and Family:   . Attends Religious Services:   . Active Member of Clubs or Organizations:   . Attends Theatre manager Meetings:   Karina [name] Marital Status:   Intimate Partner Violence:   . Fear of Current or Ex-Partner:   . Emotionally Abused:   Karina [name] Physically Abused:   . Sexually Abused:     Family History  Problem Relation Age of Onset  . Diabetes Father   . Cancer Mother   . Diabetes Mother     Past Medical History:  Diagnosis Date  . Acute pain of right shoulder 03/13/2019  . Back pain 12/12/2017  . Carpal tunnel syndrome of right wrist 03/13/2019  . Depression 12/27/2012  . Diabetes mellitus, uncontrolled     Type 2, insulin resistant  . Diabetic type II diabetes mellitus ([place]) 05/23/2013  . DOE (dyspnea on exertion) 05/21/2019   Onset with covid 19 infection symptoms started 01/16/19  -  05/23/2019   Walked RA  3 laps @ approx 23ft each @ moderat pace  stopped due to end of study,  no limiting  sob/ sats 100% at end despite reported sob at rest   . Essential hypertension, benign 12/12/2013  . History of COVID-19 03/22/2019  . Lower extremity edema 08/29/2017  . Moderate nonproliferative diabetic retinopathy (HCC) 05/24/2013   Both OS and OD.  Diagnosed by Dr. Altamease Oiler, MD, PhD 05/23/13. Also mild macular edema. Follows with Dr. Dione Booze  . Morbid obesity (HCC) 05/25/2013  . Nephrotic syndrome due to diabetes mellitus (HCC) 04/10/2019  . 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 Shortness of breath 03/22/2019  . Syncope 04/08/2015  . Tachycardia 05/21/2019  . 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 Upper airway cough syndrome 03/22/2019    Past Surgical History:  Procedure Laterality Date  . CERVICAL BIOPSY  2004  . CESAREAN SECTION N/A 06/02/2012   Procedure: CESAREAN SECTION;  Surgeon: Catalina Antigua, MD;  Location: [facility];  Service: Obstetrics;  Laterality: N/A;    Current Outpatient Medications  Medication Sig Dispense Refill  . albuterol (VENTOLIN HFA) 108 (90 Base) MCG/ACT  inhaler Inhale 2 puffs into the lungs every 4 (four) hours as needed for wheezing or shortness of breath. 18 g 0  . benzonatate (TESSALON) 200 MG capsule Take three times a day until no longer coughing, then take as needed 45 capsule 1  . Continuous Blood Gluc Receiver (DEXCOM G6 RECEIVER) DEVI 1 Device by Does not apply route See admin instructions. 1 each 0  . Continuous Blood Gluc Sensor (DEXCOM G6 SENSOR) MISC 1 each by Does not apply route See admin instructions. Apply new sensor every 10 days. 9 each 2  . Continuous Blood Gluc Transmit (DEXCOM G6 TRANSMITTER) MISC 1 each by Does not apply route every 3 (three) months. 1 each 2  . cyclobenzaprine (FLEXERIL) 10 MG tablet Take 1 tablet (10 mg total) by mouth 3 (three) times daily as needed for muscle spasms. 30 tablet 0  . fluticasone (FLONASE) 50 MCG/ACT nasal spray Place 2 sprays into both nostrils daily. 16 g 6  . ibuprofen (ADVIL) 600 MG tablet Take 1 tablet (600 mg total) by mouth 3 (three) times daily. 21 tablet 0  . insulin degludec (TRESIBA FLEXTOUCH) 200 UNIT/ML FlexTouch Pen Inject 50 Units into the skin daily. 9 pen 3  . insulin lispro (HUMALOG KWIKPEN) 100 UNIT/ML KwikPen Inject 0.2-0.28 mLs (20-28 Units total) into the skin 3 (three) times daily. 45 mL 3  . Insulin Pen Needle (B-D ULTRAFINE III SHORT PEN) 31G X 8 MM MISC 1 Container by Does not apply route as needed. 1 each 11  . Insulin Syringe-Needle U-100 (ULTICARE INSULIN SYRINGE) 31G X 5/16" 1 ML MISC Use to inject insulin. 200 each 1  . losartan (COZAAR) 100 MG tablet Take 100 mg by mouth daily.    . pantoprazole (PROTONIX) 40 MG tablet Take 30- 60 min before your first and last meals of the day 60 tablet 2  . metoprolol tartrate (LOPRESSOR) 50 MG tablet Take 2 tablets (100 mg total) by mouth once for 1 dose. 2 hour prior to procedure 2 tablet 0   No current facility-administered medications for this visit.    Allergies as of 07/05/2019 - Review Complete 07/05/2019  Allergen  Reaction Noted  . Lisinopril Cough 12/26/2017  . Contrast media [iodinated diagnostic agents]  07/02/2019    Vitals: BP 132/70   Pulse 96   Ht 5\' 2"  (1.575 m)   Wt  261 lb (118.4 kg)   LMP 06/12/2019   SpO2 98%   BMI 47.74 kg/m  Last Weight:  Wt Readings from Last 1 Encounters:  07/05/19 261 lb (118.4 kg)   Last Height:   Ht Readings from Last 1 Encounters:  07/05/19 5\' 2"  (1.575 m)    Physical exam:  General: The [name] is awake, alert and appears not in acute distress. The [name] is groomed. Head: Normocephalic, atraumatic. Neck is supple. Mallampati 3, neck circumference:19 Cardiovascular:  Regular rate and rhythm , without  murmurs or carotid bruit, and without distended neck veins. Respiratory: Lungs are clear to auscultation. Skin:  Without evidence of edema, or rash Trunk: BMI is 47.74 elevated .  Neurologic exam : The [name] is awake and alert, oriented to place and time.  Memory subjective described as intact. There is a normal attention span & concentration ability. Speech is fluent without dysarthria, dysphonia or aphasia. Mood and affect are aloof.  Cranial nerves: Pupils are equal and briskly reactive to light. Funduscopic exam with evidence of pallor / edema. Extraocular movements  in vertical and horizontal planes intact and without nystagmus.  Visual fields by finger perimetry are intact. She reports abnormal depth perception.  Hearing to finger rub intact.  Facial sensation intact to fine touch. Facial motor strength is symmetric and tongue and uvula move midline. Tongue protrusion into either cheek is normal.  Shoulder shrug is normal.   Motor exam:   Normal tone ,muscle bulk and symmetric  strength in all extremities. Sensory:  Fine touch, pinprick and vibration were  Coordination: Rapid alternating movements in the fingers/hands were normal. Finger-to-nose maneuver  normal without evidence of ataxia, dysmetria or tremor. Gait and station: [name] walks  without assistive device . Deep tendon reflexes: in the  upper and lower extremities are attenuated.  Assessment:  After physical and neurologic examination, review of laboratory studies, imaging, neurophysiology testing and pre-existing records, assessment is that of :   Full physical findings including the retinal and orbital changes are related to a longstanding history of diabetes mellitus supposedly type II, and for as long as I can follow her lab results on epic poorly controlled diabetes.  The [name] also is in the category of morbid obesity BMI 47.7, and she has had diabetes related nephropathy, vasculopathy, neuropathy, retinopathy well-documented for many years.  Her macular edema is also diabetic related but can be further aggravated by the Karina of obstructive sleep apnea CO2 retention, hypoxemia at night.  Plan:  Treatment plan and additional workup :  MRI brain to rule out any intracranial bleed or advanced vascular changes.   PSG attended sleep study ASAP_ this [name]'s eyesight may depend on fast correction of possibly present OSA, OHV.    Sekai Gitlin MD 07/05/2019

## 2019-07-06 ENCOUNTER — Telehealth: Payer: Self-pay | Admitting: Neurology

## 2019-07-06 ENCOUNTER — Encounter: Payer: Self-pay | Admitting: Family Medicine

## 2019-07-06 NOTE — Telephone Encounter (Signed)
[employer] [facility] Harvey: [id number] (exp. 07/06/19 to 08/04/19) order sent to GI. They will reach out to the patient to schedule.

## 2019-07-09 ENCOUNTER — Encounter (INDEPENDENT_AMBULATORY_CARE_PROVIDER_SITE_OTHER): Payer: BC Managed Care – PPO | Admitting: Ophthalmology

## 2019-07-11 ENCOUNTER — Other Ambulatory Visit: Payer: Self-pay

## 2019-07-11 ENCOUNTER — Encounter: Payer: Self-pay | Admitting: Family Medicine

## 2019-07-11 ENCOUNTER — Ambulatory Visit (INDEPENDENT_AMBULATORY_CARE_PROVIDER_SITE_OTHER): Payer: BC Managed Care – PPO | Admitting: Family Medicine

## 2019-07-11 DIAGNOSIS — I10 Essential (primary) hypertension: Secondary | ICD-10-CM | POA: Diagnosis not present

## 2019-07-11 DIAGNOSIS — F3289 Other specified depressive episodes: Secondary | ICD-10-CM

## 2019-07-11 MED ORDER — AMLODIPINE BESYLATE 10 MG PO TABS
10.0000 mg | ORAL_TABLET | Freq: Every day | ORAL | 3 refills | Status: DC
Start: 2019-07-11 — End: 2019-07-11

## 2019-07-11 MED ORDER — BUPROPION HCL ER (XL) 150 MG PO TB24: 150.0000 mg | ORAL_TABLET | Freq: Every day | ORAL | 5 refills | Status: DC

## 2019-07-11 NOTE — Progress Notes (Signed)
    SUBJECTIVE:   CHIEF COMPLAINT / HPI:   Mood difficulty Patient reports that she has been under a significant amount of stress recently due to her Covid symptoms and now due to a vitreous hemorrhage of her left eye. She says that she resorts to binge eating for comfort, and she is working to stop this behavior by using the weight loss service [employer]. She says that this has been going well, and it is helping her to become more cognizant of her eating habits. She is also working on walking outside multiple times per week and says that this makes her feel a lot better.  Hypertension Patient reports that she usually takes losartan nightly, but she forgot her dose last night.  She says that amlodipine has made her lower extremity swelling in the past.  She denies chest pain, headache.  PERTINENT  PMH / PSH: Uncontrolled type 2 diabetes, morbid obesity  OBJECTIVE:   BP (!) 142/90   Pulse 97   Wt 256 lb (116.1 kg)   LMP 07/10/2019   SpO2 98%   BMI 46.82 kg/m   General: well appearing, pleasant, appears stated age, patch over left eye Cardiac: RRR, no MRG Respiratory: CTAB, no rhonchi, rales, or wheezing, normal work of breathing Skin: no rashes or other lesions, warm and well perfused Psych: appropriate mood and affect  Depression screen PHQ 2/9 07/11/2019  Decreased Interest 1  Down, Depressed, Hopeless 1  PHQ - 2 Score 2  Altered sleeping 3  Tired, decreased energy 2  Change in appetite 3  Feeling bad or failure about yourself  1  Trouble concentrating 0  Moving slowly or fidgety/restless 0  Suicidal thoughts 0  PHQ-9 Score 11  Difficult doing work/chores Very difficult  Some recent data might be hidden   GAD 7 : Generalized Anxiety Score 07/11/2019 04/10/2019 03/13/2019  Nervous, Anxious, on Edge 2 1 1   Control/stop worrying 1 1 1   Worry too much - different things 1 1 1   Trouble relaxing 2 3 1   Restless 0 0 1  Easily annoyed or irritable 3 3 2   Afraid - awful might happen  2 0 0  Total GAD 7 Score 11 9 7   Anxiety Difficulty Very difficult Somewhat difficult Somewhat difficult     ASSESSMENT/PLAN:   Morbid obesity (HCC) Congratulated patient on starting a weight loss program and working on her exercise regimen.  Encouraged her to keep working on this since it will help improve her diabetes and overall health.  Depression I am reassured that patient appears to have motivation to make changes and has a normal affect today, but the stressors of her chronic health problems as well as her recent vitreous hemorrhage are making the management of her mood more difficult.  We will try bupropion 150 mg once daily since this will not cause weight gain.  Would like for her to return in 1 to 2 months for follow-up.  Essential hypertension, benign Blood pressure poorly controlled today after 2 readings.  Since patient did not take her blood pressure medication yesterday, will have her return in 1 week for a blood pressure recheck.  She is already on the maximum dose of losartan, and has had an adverse reaction to amlodipine, so a thiazide diuretic or beta-blocker could be another option for her if she continues to have elevated blood pressure in 1 week.     , MD [facility]

## 2019-07-11 NOTE — Patient Instructions (Addendum)
It was nice seeing you today Karina [name]!  I am very proud of the work that you are doing on your health. Please keep it up. Please also check out YouTube for other ideas for exercise if she cannot get out of the house to walk.  I am adding bupropion once daily to help with your mood. Please let us know if you have any issues with this medication. Please return in about 1 month so that we can monitor your progress.  Your blood pressure was a little elevated today.  Please take this once per day. I would like for you to return in about 1 week for a blood pressure recheck.  If you have any questions or concerns, please feel free to call the clinic.   Be well,  Dr. Frances Furbish

## 2019-07-11 NOTE — Assessment & Plan Note (Signed)
Blood pressure poorly controlled today after 2 readings.  Since patient did not take her blood pressure medication yesterday, will have her return in 1 week for a blood pressure recheck.  She is already on the maximum dose of losartan, and has had an adverse reaction to amlodipine, so a thiazide diuretic or beta-blocker could be another option for her if she continues to have elevated blood pressure in 1 week.

## 2019-07-11 NOTE — Assessment & Plan Note (Signed)
Congratulated patient on starting a weight loss program and working on her exercise regimen.  Encouraged her to keep working on this since it will help improve her diabetes and overall health.

## 2019-07-11 NOTE — Assessment & Plan Note (Signed)
I am reassured that patient appears to have motivation to make changes and has a normal affect today, but the stressors of her chronic health problems as well as her recent vitreous hemorrhage are making the management of her mood more difficult.  We will try bupropion 150 mg once daily since this will not cause weight gain.  Would like for her to return in 1 to 2 months for follow-up.

## 2019-07-18 ENCOUNTER — Ambulatory Visit: Payer: BC Managed Care – PPO

## 2019-07-18 ENCOUNTER — Other Ambulatory Visit: Payer: Self-pay

## 2019-07-18 ENCOUNTER — Ambulatory Visit
Admission: RE | Admit: 2019-07-18 | Discharge: 2019-07-18 | Disposition: A | Discharge status: Home / Self Care | Payer: BC Managed Care – PPO | Source: Ambulatory Visit | Attending: Neurology | Admitting: Neurology

## 2019-07-18 ENCOUNTER — Telehealth: Payer: Self-pay

## 2019-07-18 DIAGNOSIS — G441 Vascular headache, not elsewhere classified: Secondary | ICD-10-CM | POA: Diagnosis not present

## 2019-07-18 DIAGNOSIS — I10 Essential (primary) hypertension: Secondary | ICD-10-CM

## 2019-07-18 MED ORDER — GADOBENATE DIMEGLUMINE 529 MG/ML IV SOLN
20.0000 mL | Freq: Once | INTRAVENOUS | Status: AC | PRN
  Administered 2019-07-18: 20 mL via INTRAVENOUS

## 2019-07-18 NOTE — Progress Notes (Signed)
Patient presents in nurse clinic for BP recheck from 6/30.   Patients BP today 148/76 HR: 100  Patient has not taken her BP meds yet today. Patient reports she takes Losartan ~4pm each day.   Recheck 15 minutes later 144/74.  Patient scheduled for FU with new PCP 08/08/2019.

## 2019-07-18 NOTE — Telephone Encounter (Signed)
Unable to LVM for pt to call me back to schedule sleep study. Mailbox full.  

## 2019-07-20 ENCOUNTER — Ambulatory Visit (INDEPENDENT_AMBULATORY_CARE_PROVIDER_SITE_OTHER): Payer: BC Managed Care – PPO | Admitting: Internal Medicine

## 2019-07-20 ENCOUNTER — Ambulatory Visit: Payer: BC Managed Care – PPO

## 2019-07-20 ENCOUNTER — Other Ambulatory Visit (HOSPITAL_COMMUNITY): Payer: BC Managed Care – PPO

## 2019-07-20 DIAGNOSIS — Z5329 Procedure and treatment not carried out because of patient's decision for other reasons: Secondary | ICD-10-CM

## 2019-07-20 NOTE — Progress Notes (Addendum)
Patient ID: Karina [name], female   DOB: 1985-11-14, 34 y.o.   MRN: [id number]   NO SHOW  HPI: Karina [name] is a 34 y.o.-year-old female, initially referred by her PCP, Dr. Frances Furbish, returning for follow-up for DM2, dx in 2004, insulin-dependent since 2009, uncontrolled, with long-term complications (peripheral neuropathy, moderate retinopathy + macular edema).  Last visit 3 months ago (virtual).  She has a history of noncompliance with appointments, recommended medications and sugar checks.  Reviewed HbA1c levels: Lab Results  Component Value Date   HGBA1C 12.2 (A) 06/21/2019   HGBA1C 14.3 (A) 10/27/2018   HGBA1C 14.2 (A) 05/29/2018   HGBA1C 13.0 (A) 03/07/2018   HGBA1C 11.7 (A) 12/02/2017   HGBA1C 10.6 (A) 08/29/2017   HGBA1C 14.5 05/16/2017   HGBA1C 13.6 08/16/2016   HGBA1C 14.0 05/24/2016   HGBA1C 13.5 11/24/2015   HGBA1C 14.1 03/21/2015   HGBA1C 13.3 07/17/2014   HGBA1C 13.7 07/04/2014   HGBA1C 12.8 04/09/2014   HGBA1C 14.2 10/22/2013   HGBA1C >14.0 05/23/2013   HGBA1C 11.6 12/27/2012   HGBA1C 13.3 (H) 11/16/2011   At last visit she was taking her medication regularly: - Tresiba U200 50 units daily >> skipping doses off for few days... - Humalog 15 to 20 >> 24 units 3x a day before each meal - Ozempic 0.5 mg weekly >> started but ran out... cannot remember why...  We restarted her [place] and Ozempic: - Tresiba U200 50 units daily - Ozempic 0.5 mg weekly. - Humalog 20-28 units 3x a day before each meal  At last visit she was not checking her sugars and was waiting to get a Dexcom GGM. - am: 197-592 >> 80-90s (Covid 19) - 2h after b'fast: n/c >> 115  (Covid 19) - before lunch: n/c - 2h after lunch: n/c >> 110-120 (Covid 19) - before dinner: n/c - 2h after dinner: n/c  >> 110-120 (Covid 19) - bedtime: 300-HI >> n/c - nighttime: n/c Lowest sugar was 197 >> 62 >> 80; she has hypoglycemia awareness in the 80s or 90s. Highest sugar was HI >> HI >> 500 >>  588.  Glucometer: AccuChek  Pt's meals are: - Breakfast: bagel or grits, veggie sausage - Lunch: sandwich with vegan deli meat, taco, home cooked meals - Dinner: fish + veggies +/- starch - Snacks: chips, sweet drinks She is working on decreasing starches.  She also replaced red meats with fish.  -+ CKD-sees nephrology:  Lab Results  Component Value Date   BUN 25 (H) 05/24/2019   BUN 12 09/14/2018   CREATININE 1.41 (H) 05/24/2019   CREATININE 1.13 (H) 09/14/2018  On Cozaar.  + HL; last set of lipids: Lab Results  Component Value Date   CHOL 208 (H) 09/14/2018   HDL 52 09/14/2018   LDLCALC 123 (H) 09/14/2018   TRIG 190 (H) 09/14/2018   CHOLHDL 4.0 09/14/2018  She is not on a statin  - last eye exam was in 06/20/2019: + PDR with macular edema OU- dr Luciana Axe. She will see a retina specialist.  - + Numbness and tingling in her feet.  On Neurontin.  Pt has FH of DM in father.  She also has a history of irregular menstrual cycles, toenail fungus, furunculosis.  ROS: Constitutional: no weight gain/no weight loss, no fatigue, no subjective hyperthermia, no subjective hypothermia Eyes: no blurry vision, no xerophthalmia ENT: no sore throat, no nodules palpated in neck, no dysphagia, no odynophagia, no hoarseness Cardiovascular: no CP/no SOB/no palpitations/no leg swelling Respiratory:  no cough/no SOB/no wheezing Gastrointestinal: no N/no V/no D/no C/no acid reflux Musculoskeletal: no muscle aches/no joint aches Skin: no rashes, no hair loss Neurological: no tremors/+ numbness/+ tingling/no dizziness  I reviewed pt's medications, allergies, PMH, social hx, family hx, and changes were documented in the history of present illness. Otherwise, unchanged from my initial visit note.  Past Medical History:  Diagnosis Date  . Acute pain of right shoulder 03/13/2019  . Back pain 12/12/2017  . Carpal tunnel syndrome of right wrist 03/13/2019  . Depression 12/27/2012  . Diabetes mellitus     Type 2, insulin resistant  . Diabetic neuropathy, type II diabetes mellitus (HCC) 05/23/2013  . DOE (dyspnea on exertion) 05/21/2019   Onset with covid 19 infection symptoms started 01/16/19  -  05/23/2019   Walked RA  3 laps @ approx 279ft each @ moderat pace  stopped due to end of study,  no limiting sob/ sats 100% at end despite reported sob at rest   . Essential hypertension, benign 12/12/2013  . History of COVID-19 03/22/2019  . Lower extremity edema 08/29/2017  . Moderate nonproliferative diabetic retinopathy (HCC) 05/24/2013   Both OS and OD.  Diagnosed by Dr. Altamease Oiler, MD, PhD 05/23/13. Also mild macular edema. Follows with Dr. Dione Booze  . Morbid obesity (HCC) 05/25/2013  . Nephrotic syndrome due to diabetes mellitus (HCC) 04/10/2019  . 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 Shortness of breath 03/22/2019  . Syncope 04/08/2015  . Tachycardia 05/21/2019  . 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 Upper airway cough syndrome 03/22/2019   Past Surgical History:  Procedure Laterality Date  . CERVICAL BIOPSY  2004  . CESAREAN SECTION N/A 06/02/2012   Procedure: CESAREAN SECTION;  Surgeon: Catalina Antigua, MD;  Location: [facility];  Service: Obstetrics;  Laterality: N/A;   Social History   Socioeconomic History  . Marital status: Single    Spouse name: Not on file  . Number of children: Not on file  . Years of education: Not on file  . Highest education level: Not on file  Occupational History  . Not on file  Tobacco Use  . Smoking status: Never Smoker  . Smokeless tobacco: Never Used  Vaping Use  . Vaping Use: Never used  Substance and Sexual Activity  . Alcohol use: No  . Drug use: No  . Sexual activity: Yes    Birth control/protection: None  Other Topics Concern  . Not on file  Social History Narrative  . Not on file   Social Determinants of Health   Financial  Resource Strain:   . Difficulty of Paying Living Expenses:   Food Insecurity:   . Worried About Programme researcher, broadcasting/film/video in the Last Year:   . Barista in the Last Year:   Transportation Needs:   . Freight forwarder (Medical):   [name] Lack of Transportation (Non-Medical):   Physical Activity:   . Days of Exercise per Week:   . Minutes of Exercise per Session:   Stress:   . Feeling of Stress :   Social Connections:   . Frequency of Communication with Friends and Family:   . Frequency of Social Gatherings with Friends and Family:   . Attends Religious Services:   . Active Member of Clubs or Organizations:   . Attends Banker Meetings:   [name] Marital Status:  Intimate Partner Violence:   . Fear of Current or Ex-Partner:   . Emotionally Abused:   [name]. Physically Abused:   . Sexually Abused:    Current Outpatient Medications on File Prior to Visit  Medication Sig Dispense Refill  . albuterol (VENTOLIN HFA) 108 (90 Base) MCG/ACT inhaler Inhale 2 puffs into the lungs every 4 (four) hours as needed for wheezing or shortness of breath. 18 g 0  . benzonatate (TESSALON) 200 MG capsule Take three times a day until no longer coughing, then take as needed 45 capsule 1  . buPROPion (WELLBUTRIN XL) 150 MG 24 hr tablet Take 1 tablet (150 mg total) by mouth daily. 30 tablet 5  . Continuous Blood Gluc Receiver (DEXCOM G6 RECEIVER) DEVI 1 Device by Does not apply route See admin instructions. 1 each 0  . Continuous Blood Gluc Sensor (DEXCOM G6 SENSOR) MISC 1 each by Does not apply route See admin instructions. Apply new sensor every 10 days. 9 each 2  . Continuous Blood Gluc Transmit (DEXCOM G6 TRANSMITTER) MISC 1 each by Does not apply route every 3 (three) months. 1 each 2  . cyclobenzaprine (FLEXERIL) 10 MG tablet Take 1 tablet (10 mg total) by mouth 3 (three) times daily as needed for muscle spasms. 30 tablet 0  . fluticasone (FLONASE) 50 MCG/ACT nasal spray Place 2 sprays into both  nostrils daily. 16 g 6  . ibuprofen (ADVIL) 600 MG tablet Take 1 tablet (600 mg total) by mouth 3 (three) times daily. 21 tablet 0  . insulin degludec (TRESIBA FLEXTOUCH) 200 UNIT/ML FlexTouch Pen Inject 50 Units into the skin daily. 9 pen 3  . insulin lispro (HUMALOG KWIKPEN) 100 UNIT/ML KwikPen Inject 0.2-0.28 mLs (20-28 Units total) into the skin 3 (three) times daily. 45 mL 3  . Insulin Pen Needle (B-D ULTRAFINE III SHORT PEN) 31G X 8 MM MISC 1 Container by Does not apply route as needed. 1 each 11  . Insulin Syringe-Needle U-100 (ULTICARE INSULIN SYRINGE) 31G X 5/16" 1 ML MISC Use to inject insulin. 200 each 1  . losartan (COZAAR) 100 MG tablet Take 100 mg by mouth daily.    . metoprolol tartrate (LOPRESSOR) 50 MG tablet Take 2 tablets (100 mg total) by mouth once for 1 dose. 2 hour prior to procedure 2 tablet 0  . pantoprazole (PROTONIX) 40 MG tablet Take 30- 60 min before your first and last meals of the day 60 tablet 2  . [DISCONTINUED] gabapentin (NEURONTIN) 100 MG capsule Take 1 capsule (100 mg total) by mouth at bedtime. 90 capsule 3   No current facility-administered medications on file prior to visit.   Allergies  Allergen Reactions  . Lisinopril Cough  . Contrast Media [Iodinated Diagnostic Agents]    Family History  Problem Relation Age of Onset  . Diabetes Father   . Cancer Mother   . Diabetes Mother    PE: LMP 07/10/2019  Wt Readings from Last 3 Encounters:  07/11/19 256 lb (116.1 kg)  07/05/19 261 lb (118.4 kg)  07/02/19 264 lb 12.8 oz (120.1 kg)   Constitutional: overweight, in NAD Eyes: PERRLA, EOMI, no exophthalmos ENT: moist mucous membranes, no thyromegaly, no cervical lymphadenopathy Cardiovascular: RRR, No MRG Respiratory: CTA B Gastrointestinal: abdomen soft, NT, ND, BS+ Musculoskeletal: no deformities, strength intact in all 4 Skin: moist, warm, no rashes Neurological: no tremor with outstretched hands, DTR normal in all 4  ASSESSMENT: 1. DM2,  insulin-dependent, uncontrolled, with complications - PN - moderate NP DR  OD and OS, with macular edema - toenail fungus - furunculosis  2.  Obesity class III  PLAN:  1. Patient with longstanding uncontrolled, type 2 diabetes, with history of noncompliance with her basal/bolus insulin regimen and also weekly GLP-1 receptor agonist, and also with poor compliance with blood sugar checks and visits.  Her lowest HbA1c in the last 8 years was 10.6% in 2020.  We did discuss in the past that if she is not coming for the appointment and following treatment recommendations, I cannot help her. -One of the problems is that she is still working nights which hinders her diabetes control.  Also, she described in the past that she is not taking insulin because she did not want to stick herself.  She was telling me that she had an insulin pump at home (630 G Medtronic) and would like to start using it.  I referred her to nutrition and diabetes education to start the plan but she did not follow-up with his referrals.  We also discussed about a VGo mechanical pump  but this was not covered. -At last check (last month) HbA1c is still high, but improved from before, at 12.2%. -At last visit, she was on [place] and Ozempic as sugars were very high.  I advised her to restart both.  I also advised her how to vary the dose of Humalog based on her meals.  She was not checking sugars strongly advised her to restart checking before every meal and occasionally at bedtime.  She was trying to obtain a Dexcom CGM.  Of note, freestyle libre CGM did not work well for her.  - I suggested to:  Patient Instructions  Please continue: - Tresiba U200 50 units daily - Ozempic 0.5 mg weekly. - Humalog 20-28 units 3x a day before each meal  Please return in 3 months with your sugar log.   - we checked her HbA1c: 7%  - advised to check sugars at different times of the day - 3x a day, rotating check times - advised for yearly eye  exams >> she is UTD - return to clinic in 3 months  2.  Obesity class III  -At last visit, she was off Ozempic and we restarted it.  This should also help with weight loss. -Bariatric surgery was not covered by her insurance -In the past, I advised her that phentermine would be contraindicated in her because of the high heart rate. -She gained 18 pounds before last visit  Carlus Pavlov, MD PhD [facility]

## 2019-07-21 ENCOUNTER — Other Ambulatory Visit: Payer: BC Managed Care – PPO

## 2019-07-23 ENCOUNTER — Telehealth: Payer: Self-pay | Admitting: Neurology

## 2019-07-23 NOTE — Telephone Encounter (Signed)
Pt called wanting to know if her MRI results are in. Please advise. °

## 2019-07-23 NOTE — Telephone Encounter (Signed)
Called the patient to advise that Dr Epimenio Foot had reviewed the MRI imaging and it appeared normal. Pt verbalized understanding. Pt had no questions at this time but was encouraged to call back if questions arise.

## 2019-07-24 ENCOUNTER — Encounter: Payer: Self-pay | Admitting: Neurology

## 2019-07-25 ENCOUNTER — Other Ambulatory Visit: Payer: BC Managed Care – PPO

## 2019-07-25 ENCOUNTER — Other Ambulatory Visit (HOSPITAL_COMMUNITY): Payer: Self-pay | Admitting: *Deleted

## 2019-07-25 ENCOUNTER — Telehealth (HOSPITAL_COMMUNITY): Payer: Self-pay | Admitting: *Deleted

## 2019-07-25 MED ORDER — PREDNISONE 50 MG PO TABS
ORAL_TABLET | ORAL | 0 refills | Status: DC
Start: 2019-07-25 — End: 2019-10-03

## 2019-07-25 NOTE — Telephone Encounter (Signed)
Reaching out to patient to offer assistance regarding upcoming cardiac imaging study; pt verbalizes understanding of appt date/time, parking situation and where to check in, pre-test NPO status and medications ordered, and verified current allergies; name and call back number provided for further questions should they arise  [place] [facility] [phone] office [phone] cell  Pt verbalized understanding of 13 hour prep and that she would need a driver.

## 2019-07-26 ENCOUNTER — Telehealth (HOSPITAL_COMMUNITY): Payer: Self-pay | Admitting: *Deleted

## 2019-07-26 ENCOUNTER — Other Ambulatory Visit (HOSPITAL_COMMUNITY): Payer: Self-pay | Admitting: *Deleted

## 2019-07-26 ENCOUNTER — Ambulatory Visit (HOSPITAL_COMMUNITY): Admission: RE | Admit: 2019-07-26 | Payer: BC Managed Care – PPO | Source: Ambulatory Visit

## 2019-07-26 MED ORDER — METOPROLOL TARTRATE 50 MG PO TABS: 100.0000 mg | ORAL_TABLET | Freq: Once | ORAL | 0 refills | Status: DC

## 2019-07-26 NOTE — Telephone Encounter (Signed)
Returning a call from patient regarding cardiac CT scan. Pt states she was unable to get the blood work and her metoprolol.  Pt informed that medication was called in again and that she could get blood work at the [facility] prior to her test.  Pt also informed that the scheduler would reach out to reschedule.  Pt expressed understanding.  [name], Cardiac Imaging Nurse Navigator Office; [phone]

## 2019-07-29 ENCOUNTER — Encounter: Payer: Self-pay | Admitting: Internal Medicine

## 2019-07-30 ENCOUNTER — Ambulatory Visit (INDEPENDENT_AMBULATORY_CARE_PROVIDER_SITE_OTHER): Payer: BC Managed Care – PPO | Admitting: Neurology

## 2019-07-30 DIAGNOSIS — E662 Morbid (severe) obesity with alveolar hypoventilation: Secondary | ICD-10-CM

## 2019-07-30 DIAGNOSIS — E113313 Type 2 diabetes mellitus with moderate nonproliferative diabetic retinopathy with macular edema, bilateral: Secondary | ICD-10-CM

## 2019-07-30 DIAGNOSIS — G4733 Obstructive sleep apnea (adult) (pediatric): Secondary | ICD-10-CM | POA: Diagnosis not present

## 2019-07-30 DIAGNOSIS — E1165 Type 2 diabetes mellitus with hyperglycemia: Secondary | ICD-10-CM

## 2019-07-30 DIAGNOSIS — I10 Essential (primary) hypertension: Secondary | ICD-10-CM

## 2019-07-30 DIAGNOSIS — G441 Vascular headache, not elsewhere classified: Secondary | ICD-10-CM

## 2019-07-30 DIAGNOSIS — E093419 Drug or chemical induced diabetes mellitus with severe nonproliferative diabetic retinopathy with macular edema, unspecified eye: Secondary | ICD-10-CM

## 2019-07-30 DIAGNOSIS — Z6841 Body Mass Index (BMI) 40.0 and over, adult: Secondary | ICD-10-CM

## 2019-08-06 ENCOUNTER — Telehealth (HOSPITAL_COMMUNITY): Payer: Self-pay | Admitting: *Deleted

## 2019-08-06 DIAGNOSIS — R079 Chest pain, unspecified: Secondary | ICD-10-CM | POA: Diagnosis not present

## 2019-08-06 DIAGNOSIS — R0600 Dyspnea, unspecified: Secondary | ICD-10-CM | POA: Diagnosis not present

## 2019-08-06 NOTE — Telephone Encounter (Signed)
Reaching out to patient to offer assistance regarding upcoming cardiac imaging study; pt verbalizes understanding of appt date/time, parking situation and where to check in, pre-test NPO status and medications ordered, and verified current allergies; name and call back number provided for further questions should they arise  [place] [facility] [phone] office [phone] cell  Pt verbalized understanding of how to take 13hr prep and that she needed to get blood work today prior to her test scheduled for July 27.

## 2019-08-07 ENCOUNTER — Encounter (HOSPITAL_COMMUNITY): Payer: Self-pay

## 2019-08-07 ENCOUNTER — Ambulatory Visit (HOSPITAL_COMMUNITY)
Admission: RE | Admit: 2019-08-07 | Discharge: 2019-08-07 | Disposition: A | Discharge status: Home / Self Care | Payer: BC Managed Care – PPO | Source: Ambulatory Visit | Attending: Internal Medicine | Admitting: Internal Medicine

## 2019-08-07 ENCOUNTER — Encounter: Payer: BC Managed Care – PPO | Admitting: *Deleted

## 2019-08-07 ENCOUNTER — Other Ambulatory Visit: Payer: Self-pay

## 2019-08-07 DIAGNOSIS — R0609 Other forms of dyspnea: Secondary | ICD-10-CM

## 2019-08-07 DIAGNOSIS — R079 Chest pain, unspecified: Secondary | ICD-10-CM

## 2019-08-07 DIAGNOSIS — R0600 Dyspnea, unspecified: Secondary | ICD-10-CM

## 2019-08-07 DIAGNOSIS — Z8616 Personal history of COVID-19: Secondary | ICD-10-CM | POA: Diagnosis not present

## 2019-08-07 DIAGNOSIS — Z006 Encounter for examination for normal comparison and control in clinical research program: Secondary | ICD-10-CM

## 2019-08-07 DIAGNOSIS — M7989 Other specified soft tissue disorders: Secondary | ICD-10-CM

## 2019-08-07 LAB — BASIC METABOLIC PANEL
BUN/Creatinine Ratio: 12 (ref 9–23)
BUN: 18 mg/dL (ref 6–20)
CO2: 22 mmol/L (ref 20–29)
Calcium: 8.7 mg/dL (ref 8.7–10.2)
Chloride: 100 mmol/L (ref 96–106)
Creatinine, Ser: 1.49 mg/dL — ABNORMAL HIGH (ref 0.57–1.00)
GFR calc Af Amer: 52 mL/min/{1.73_m2} — ABNORMAL LOW (ref >59)
GFR calc non Af Amer: 45 mL/min/{1.73_m2} — ABNORMAL LOW (ref >59)
Glucose: 383 mg/dL — ABNORMAL HIGH (ref 65–99)
Potassium: 4.7 mmol/L (ref 3.5–5.2)
Sodium: 134 mmol/L (ref 134–144)

## 2019-08-07 MED ORDER — METOPROLOL TARTRATE 5 MG/5ML IV SOLN
INTRAVENOUS | Status: AC
  Administered 2019-08-07: 5 mg
  Filled 2019-08-07: qty 15

## 2019-08-07 MED ORDER — DILTIAZEM HCL 25 MG/5ML IV SOLN
5.0000 mg | INTRAVENOUS | Status: DC | PRN
  Administered 2019-08-07 (×4): 5 mg via INTRAVENOUS
  Filled 2019-08-07: qty 5

## 2019-08-07 MED ORDER — DILTIAZEM HCL 25 MG/5ML IV SOLN
INTRAVENOUS | Status: AC
  Filled 2019-08-07: qty 5

## 2019-08-07 MED ORDER — METOPROLOL TARTRATE 5 MG/5ML IV SOLN
5.0000 mg | INTRAVENOUS | Status: DC | PRN
  Administered 2019-08-07 (×2): 5 mg via INTRAVENOUS

## 2019-08-07 MED ORDER — NITROGLYCERIN 0.4 MG SL SUBL: 0.8000 mg | SUBLINGUAL_TABLET | Freq: Once | SUBLINGUAL | Status: DC

## 2019-08-07 NOTE — Progress Notes (Signed)
Per Dr. Jacques Navy exam is to be cancelled due to elevated creatinine and elevated HR post medication administration.See previous note and MAR

## 2019-08-07 NOTE — Progress Notes (Signed)
Spoke with Dr. Delton See in regards to patient HR in the high 80s. Patient completed 13 hr prep for contrast allergy and took 100 mg of lopressor. At this time this RN has given 15 mg IV lopressor and 10 mg IV cardizem and HR/BP still elevated. Dr. Delton See recommends giving additional cardizem as long as BP supportive.

## 2019-08-07 NOTE — Research (Signed)
CADFEM Informed Consent                  Subject Name:   Karina [name]   Subject met inclusion and exclusion criteria.  The informed consent form, study requirements and expectations were reviewed with the subject and questions and concerns were addressed prior to the signing of the consent form.  The subject verbalized understanding of the trial requirements.  The subject agreed to participate in the CADFEM trial and signed the informed consent.  The informed consent was obtained prior to performance of any protocol-specific procedures for the subject.  A copy of the signed informed consent was given to the subject and a copy was placed in the subject's medical record.   [place] Brogden, Research Assistant  08/07/2019  13:46 p.m.

## 2019-08-07 NOTE — Progress Notes (Signed)
SUBJECTIVE:  CHIEF COMPLAINT / HPI:   Mood Difficulty  PHQ 9  Depression since January. Around the time she had COVID. Never SI. Has tried meditation, exercise (which helps a lot). Has not tried other medications. Currently working on counseling with [employer] through her job.  Was supposed to start Wellbutrin 150 mg XL as prescribed by Dr. Frances Furbish.  Patient reports that she has not tried this yet.  Hypertension  140/88 Scheduled for echocardiogram tomorrow morning.  Started having high blood pressure last year when she started eating meat after being vegetarian. Sees [facility], Dr. Signe Colt. Increased Losartan and started furosemide. Last saw her in February. Will be following with them soon as she was supposed to get contrast CT yesterday, but was unable to due to tachycardia.   Diabetes June last A1C 12.2 Previously on an insulin pump in 2014 and patient reports that she was in her best health at that time. Patient preference to go back to insulin injecitons. Sugars in the 300-400s.  Follows with endocrinology, though has not been seen with them since early spring.  Toenail issues  Patient reports that she got out of bed the other day and her toenail stayed in the bed.  She reports that they have never fallen off before.  She does note that there is been some tingling in her feet likely due to neuropathy from diabetes.  She would like referral to podiatry.  PERTINENT  PMH / PSH: Morbid obesity, type 2 diabetes with multiple complications (diabetic retinopathy, kidney disease), depression   OBJECTIVE:  BP (!) 140/88   Pulse 95   Ht 5\' 2"  (1.575 m)   Wt (!) 259 lb 6.4 oz (117.7 kg)   LMP 08/07/2019 (Exact Date)   SpO2 98%   BMI 47.44 kg/m   Well-appearing female, no acute distress. Psych: Patient is well groomed with good hygiene.  Speech is normal with normal rate, latency, volume and intonation.  Behavior is normal with normal eye contact.  Patient is friendly, cooperative.  Tight and logical  thought process is.  No abnormal thought content.  Mood is good with appropriate affect and range of emotion.   ASSESSMENT/PLAN:  Current mild episode of major depressive disorder (HCC) PHQ-9 today is 5.  Patient does not have any SI.  Patient has not started medication but plans to start medication.  Would like to see patient in 2 weeks after starting medication to check in.  Plans to increase Wellbutrin to 300 mg eventually.  Patient also given list of counseling services as well.  Type 2 diabetes mellitus with both eyes affected by moderate nonproliferative retinopathy and macular edema, with long-term current use of insulin (HCC) Discussed diabetes with patient today.  [facility] follows with [facility].  Per [facility] notes, patient is noncompliant with medications and appointments.  She reports that she has not been dismissed from their practice.  I strongly encourage patient to follow-up with endocrinology as her blood glucose levels have been severely elevated at home from 300-400.  Additionally, she has been having worsening hypertension and worsening kidney disease.  Last year, Dr. 08/09/2019 referred her to Dr. Frances Furbish for telephone visit.  We will message Dr. Raymondo Band to see if this is a patient that he is able to manage.  I believe that she would greatly benefit from closer follow-up. Currently, patient is taking Tresiba 50 units daily, 20 units Humulin with meals.  She was on Ozempic previously but reports that this was too expensive  for her.  Unsure if she communicated this with her endocrinologist.  If barriers are affording medication, we can certainly help her via CCM.  At follow-up, will discuss CCM with patient to see if she would like a referral.  Hypertension associated with stage 2 chronic kidney disease due to type 2 diabetes mellitus [facility]) Patient currently follows with [facility].  Patient reports the last time she was seen was in February.   Losartan was increased to 100 mg and was also started on Lasix 20 mg.  Her blood pressure is 140/88 and continues to be elevated today.  Strongly encourage patient to go and see [facility] again as she will need closer follow-up with her kidney function.    Melene Plan, MD [facility]

## 2019-08-08 ENCOUNTER — Ambulatory Visit (INDEPENDENT_AMBULATORY_CARE_PROVIDER_SITE_OTHER): Payer: BC Managed Care – PPO | Admitting: Family Medicine

## 2019-08-08 ENCOUNTER — Encounter: Payer: Self-pay | Admitting: Family Medicine

## 2019-08-08 ENCOUNTER — Other Ambulatory Visit: Payer: Self-pay

## 2019-08-08 VITALS — BP 140/88 | HR 95 | Ht 62.0 in | Wt 259.4 lb

## 2019-08-08 DIAGNOSIS — E113313 Type 2 diabetes mellitus with moderate nonproliferative diabetic retinopathy with macular edema, bilateral: Secondary | ICD-10-CM | POA: Diagnosis not present

## 2019-08-08 DIAGNOSIS — E1122 Type 2 diabetes mellitus with diabetic chronic kidney disease: Secondary | ICD-10-CM

## 2019-08-08 DIAGNOSIS — N182 Chronic kidney disease, stage 2 (mild): Secondary | ICD-10-CM

## 2019-08-08 DIAGNOSIS — F320 Major depressive disorder, single episode, mild: Secondary | ICD-10-CM | POA: Insufficient documentation

## 2019-08-08 DIAGNOSIS — S99829A Other specified injuries of unspecified foot, initial encounter: Secondary | ICD-10-CM | POA: Diagnosis not present

## 2019-08-08 DIAGNOSIS — Z794 Long term (current) use of insulin: Secondary | ICD-10-CM

## 2019-08-08 DIAGNOSIS — I129 Hypertensive chronic kidney disease with stage 1 through stage 4 chronic kidney disease, or unspecified chronic kidney disease: Secondary | ICD-10-CM

## 2019-08-08 MED ORDER — BLOOD PRESSURE MONITOR AUTOMAT DEVI: 1.0000 | Freq: Every day | 0 refills | Status: DC

## 2019-08-08 MED ORDER — BD PEN NEEDLE SHORT U/F 31G X 8 MM MISC: 1.0000 | 11 refills | Status: AC | PRN

## 2019-08-08 NOTE — Patient Instructions (Signed)
Therapy and Counseling Resources Most providers on this list will take Medicaid. Patients with commercial insurance or Medicare should contact their insurance company to get a list of in network providers.  [facility]  [address] [place], [place] [address]      [phone]  [facility] [address]  [place], [place] [address] [phone]  [facility] [address]  [place], [place] [address]       [phone]      [facility]ovita [facility]uss[facility] [address] [place], [place] [phone]  [facility]:  [address] [place] [phone]  [facility]:  [address] [place], [place] [phone]  [facility] (under & uninsured) [address]   [place] [phone]    [email or web address]    [facility] -[address]     Phone:  [phone]     [facility]-  [address]  [phone]   [facility] [address]      [place], [place] [phone]  [facility]: [address] [place], [place], [place]  [phone]   [facility] (Spanish therapist) [address] [place]  [address]   [place] [address]    [phone]    [facility]   [address],  [place], [place]  [phone]   [facility]  [address] [place]  [phone]  [facility]  [address] [place], [place]        [phone]  [facility] under & uninsured  [place], To schedule an appointment call [phone] [address], [place] [phone]):  Macario Golds from 8 AM - 3 PM Moving June 1 to [employer] [facility] at [facility] [address]  [facility] [address],  (Spanish)   [address], [place]: [phone]) 8:30 - 12; 1 - 2:30  [facility] [employer],  [address] [place], [place]    [phone]):8:30 - 12; 2 - 3PM  [facility],  [address],  [place]; [phone]):   Mon - Fri 8 AM - 5 PM  [facility] [address] [place]  MWF 12:30 to 3:00 or call to schedule an appointment  [phone]  Specific Provider options Psychology Today  [email or web address]://www.psychologytoday.com/us 1. click on find a therapist  2. enter your zip code 3. left side and select or tailor a therapist for your specific need.   [facility] Provider Directory [email or web address]  (Medicaid)   Follow all drop down to find a provider  Social Support program [facility] [phone] or [employer] [address], [place], [place] Recovery support and educational   24- Hour Availability:  .  [name]  . [address] [place], [place] [employer] [phone] Crisis [phone]  . [facility] [phone]  [facility] Crisis Service  [phone]   . [facility] [employer]  [phone] (after hours)  . [facility]/Mobile Crisis   [phone]  . [place] National Suicide Hotline  570-248-1523 (TALK)  . Call 911 or go to emergency room  . [employer]  [phone]);  [place] [facility] [employer]   . [facility]  [phone]); [place],  Ardelia Mems, [place], [place], [place], [place]

## 2019-08-08 NOTE — Assessment & Plan Note (Signed)
PHQ-9 today is 5.  Patient does not have any SI.  Patient has not started medication but plans to start medication.  Would like to see patient in 2 weeks after starting medication to check in.  Plans to increase Wellbutrin to 300 mg eventually.  Patient also given list of counseling services as well.

## 2019-08-08 NOTE — Assessment & Plan Note (Signed)
Patient currently follows with [facility].  Patient reports the last time she was seen was in February.  Losartan was increased to 100 mg and was also started on Lasix 20 mg.  Her blood pressure is 140/88 and continues to be elevated today.  Strongly encourage patient to go and see [facility] again as she will need closer follow-up with her kidney function.

## 2019-08-08 NOTE — Assessment & Plan Note (Addendum)
Discussed diabetes with patient today.  Patient follows with [facility] our endocrinology.  Per low our endocrinology notes, patient is noncompliant with medications and appointments.  She reports that she has not been dismissed from their practice.  I strongly encourage patient to follow-up with endocrinology as her blood glucose levels have been severely elevated at home from 300-400.  Additionally, she has been having worsening hypertension and worsening kidney disease.  Last year, Dr. Frances Furbish referred her to Dr. Raymondo Band for telephone visit.  We will message Dr. Raymondo Band to see if this is a patient that he is able to manage.  I believe that she would greatly benefit from closer follow-up. Currently, patient is taking Tresiba 50 units daily, 20 units Humulin with meals.  She was on Ozempic previously but reports that this was too expensive for her.  Unsure if she communicated this with her endocrinologist.  If barriers are affording medication, we can certainly help her via CCM.  At follow-up, will discuss CCM with patient to see if she would like a referral.

## 2019-08-09 ENCOUNTER — Ambulatory Visit (HOSPITAL_COMMUNITY): Payer: BC Managed Care – PPO | Attending: Cardiovascular Disease

## 2019-08-09 DIAGNOSIS — R0609 Other forms of dyspnea: Secondary | ICD-10-CM

## 2019-08-09 DIAGNOSIS — Z8616 Personal history of COVID-19: Secondary | ICD-10-CM | POA: Insufficient documentation

## 2019-08-09 DIAGNOSIS — M7989 Other specified soft tissue disorders: Secondary | ICD-10-CM | POA: Insufficient documentation

## 2019-08-09 DIAGNOSIS — R079 Chest pain, unspecified: Secondary | ICD-10-CM | POA: Diagnosis not present

## 2019-08-09 DIAGNOSIS — R0600 Dyspnea, unspecified: Secondary | ICD-10-CM | POA: Insufficient documentation

## 2019-08-09 LAB — ECHOCARDIOGRAM COMPLETE
Area-P 1/2: 3.21 cm2
S' Lateral: 2.5 cm

## 2019-08-16 ENCOUNTER — Telehealth: Payer: Self-pay | Admitting: *Deleted

## 2019-08-16 NOTE — Telephone Encounter (Signed)
Spoke to patient . She was not able to have CCTA  Due to ELEVATED HEARTRATE  Per Dr Jacques Navy,  Patient will need an  Appointment to discuss if  Further testing is needed. Patient is aware . Virtual appointment scheduled

## 2019-08-17 DIAGNOSIS — E1165 Type 2 diabetes mellitus with hyperglycemia: Secondary | ICD-10-CM | POA: Insufficient documentation

## 2019-08-17 DIAGNOSIS — E113522 Type 2 diabetes mellitus with proliferative diabetic retinopathy with traction retinal detachment involving the macula, left eye: Secondary | ICD-10-CM | POA: Diagnosis not present

## 2019-08-17 DIAGNOSIS — E113553 Type 2 diabetes mellitus with stable proliferative diabetic retinopathy, bilateral: Secondary | ICD-10-CM | POA: Insufficient documentation

## 2019-08-17 NOTE — Progress Notes (Signed)
IMPRESSION:  1. Moderate severe Obstructive Sleep Apnea with hypopneas  dominating the AHI of  29.7/h(OSA), supine sleep accentuated the  AHI and REM AHI was 68/h , indicating REM sleep dependence.  2. Clinically insignificant degree of Periodic Limb Movement  Disorder (PLMD).  3. Prolonged hypoxemia of 67 minutes within a TST of 341 minutes.    RECOMMENDATIONS:  1. Advise full night, attended, PAP titration study to optimize  therapy and have opportunity to add oxygen, if needed.  2. Dental devices will neither address/ control hypoxia nor REM  dependent apnea. Only PAP therapy can do achieve that.  3. Grade 3 obesity is contributing to the retinal changes as it  fosters DM and OSA.

## 2019-08-17 NOTE — Addendum Note (Signed)
Addended by: Melvyn Novas on: 08/17/2019 11:29 AM   Modules accepted: Orders

## 2019-08-17 NOTE — Procedures (Signed)
PATIENT'S NAME:  Karina [name], Karina [name] DOB:      08/10/1985      MR#:    [id number]     DATE OF RECORDING: 07/30/2019  AL REFERRING M.D.:  Lezlie Octave MD, Dr Fawn Kirk, PA [place] Study Performed:   Baseline Polysomnogram HISTORY:  34 y.o. female with uncontrolled DM, retinal changes and headaches is seen here upon a referral from Dr.[facility] .   [name], a 34 year old African female with a longstanding history of poorly controlled diabetes mellitus and had multiple ophthalmological, vascular, retinal, renal and neurologic complications. The patient also contracted COVID-19 and stated that she had a longstanding dry cough for which she has followed up with Dr. Sherene Sires.  Her diabetes is uncontrolled and has been for month, if not years- see epic. She is an insulin-dependent diabetes type 2 patient. She is here upon referral by her eye care provider-She was seen at [facility] on 19 June 2019 by Alma Downs, PA , to whom the patient reported that she had since Sunday a headache. No acute ophthalmologic changes were explaining the pain and therefore he referred to neurology as primary care was not available.  She has recently canceled an appointment with her retinal specialist but promised to follow-up.  She has seen Dr. Sherene Sires internal medicine-pulmonology. She is post Covid 19 was infected in early January.   She is in diabetic care with Dr. Melvenia Beam, endocrinologist.   The patient experienced on June 6 a new symptom of a left-sided headache with retro-orbital pain pressure and blurring of vision. She called her ophthalmologist and was to be seen on Tuesday following 8 June.  She is wearing an eye patch he has photosensitivity and light hurts her eyes.  She is straining to see with the left. The patient's pupils responded bilaterally to light and accommodation, but she still reports eye pain.  I was lucky that Mrs. Sun had her retinal images saved to her smart phone from June 9.  Shows  clearly quite significant changes- these pictures were obtained by her retinal specialist. Aside from the obvious risk factor of manifestations of diabetes type 2 and to increase the degree of control, the second stabilizing treatment is to evaluate for obstructive sleep apnea and make sure that she does not have a contribution of ocular pressure from obstructive sleep apnea. I would like to add that the HbA1 c was 12.2 (!) on 06-21-2019.    The patient endorsed the Epworth Sleepiness Scale at 15/24 points.   The patient's weight 260 pounds with a height of 62 (inches), resulting in a BMI of 47.9 kg/m2. The patient's neck circumference measured 19 inches.  CURRENT MEDICATIONS: Ventolin, Flexeril, Flonase, Advil, Humalog, Cozaar, Protonix, Lopressor   PROCEDURE:  This is a multichannel digital polysomnogram utilizing the Somnostar 11.2 system.  Electrodes and sensors were applied and monitored per AASM Specifications.   EEG, EOG, Chin and Limb EMG, were sampled at 200 Hz.  ECG, Snore and Nasal Pressure, Thermal Airflow, Respiratory Effort, CPAP Flow and Pressure, Oximetry was sampled at 50 Hz. Digital video and audio were recorded.      BASELINE STUDY: Lights Out was at 20:57 and Lights On at 05:00.  Total recording time (TRT) was 483.5 minutes, with a total sleep time (TST) of 341 minutes.   The patient's sleep latency was 65 minutes.  REM latency was 68 minutes.  The sleep efficiency was 70.5 %.     SLEEP ARCHITECTURE: WASO (Wake after sleep  onset) was 95 minutes.  There were 25 minutes in Stage N1, 69.5 minutes Stage N2, 134 minutes Stage N3 and 112.5 minutes in Stage REM.  The percentage of Stage N1 was 7.3%, Stage N2 was 20.4%, Stage N3 was 39.3% and Stage R (REM sleep) was 33.%.   RESPIRATORY ANALYSIS:  There were a total of 169 respiratory events:  50 obstructive apneas, 2 central apneas and 0 mixed apneas with a total of 52 apneas and an apnea index (AI) of 9.1 /hour. There were 117 hypopneas  with a hypopnea index of 20.6 /hour.      The total APNEA/HYPOPNEA INDEX (AHI) was 29.7/hour and the total RESPIRATORY DISTURBANCE INDEX was  29.7 /hour.  128 events occurred in REM sleep and 72 events in NREM. The REM AHI was  68.3 /hour, versus a non-REM AHI of 10.8.  The patient spent 50 minutes of total sleep time in the supine position and 291 minutes in non-supine. The supine AHI was 39.6/h versus a non-supine AHI of 28.0/h.  OXYGEN SATURATION & C02:  The Wake baseline 02 saturation was 98%, with the lowest being 49%. Time spent below 89% saturation equaled 64 minutes. The arousals were noted as: 17 were spontaneous, 1 were associated with PLM, 42 were associated with respiratory events. The patient had a total of 52 Periodic Limb Movements.  The Periodic Limb Movement (PLM) Arousal index was 0.2/hour. The patient took bathroom breaks. Snoring was noted. EKG was in keeping with normal sinus rhythm (NSR).  IMPRESSION: 1. Moderate severe Obstructive Sleep Apnea with hypopneas dominating the AHI of   29.7/h(OSA), supine sleep accentuated the AHI and REM AHI was 68/h , indicating REM sleep dependence. 2. Clinically insignificant degree of Periodic Limb Movement Disorder (PLMD). 3. Prolonged hypoxemia of 67 minutes within a TST of 341 minutes.   RECOMMENDATIONS: 1. Advise full night, attended, PAP titration study to optimize therapy and have opportunity to add oxygen, if needed.  2. Dental devices will neither address/ control hypoxia nor REM dependent apnea. Only PAP therapy can do achieve that. 3. Grade 3 obesity is contributing to the retinal changes as it fosters DM and OSA.     I certify that I have reviewed the entire raw data recording prior to the issuance of this report in accordance with the Standards of Accreditation of the American Academy of Sleep Medicine (AASM)  Melvyn Novas, MD Diplomat, American Board of Psychiatry and Neurology  Diplomat, American Board of Sleep  Medicine Wellsite geologist, [place] [facility] [employer]

## 2019-08-20 ENCOUNTER — Encounter: Payer: Self-pay | Admitting: Internal Medicine

## 2019-08-20 ENCOUNTER — Telehealth: Payer: Self-pay

## 2019-08-20 ENCOUNTER — Telehealth: Payer: Self-pay | Admitting: Neurology

## 2019-08-20 DIAGNOSIS — Z6841 Body Mass Index (BMI) 40.0 and over, adult: Secondary | ICD-10-CM

## 2019-08-20 DIAGNOSIS — E093419 Drug or chemical induced diabetes mellitus with severe nonproliferative diabetic retinopathy with macular edema, unspecified eye: Secondary | ICD-10-CM

## 2019-08-20 DIAGNOSIS — G441 Vascular headache, not elsewhere classified: Secondary | ICD-10-CM

## 2019-08-20 DIAGNOSIS — E1165 Type 2 diabetes mellitus with hyperglycemia: Secondary | ICD-10-CM

## 2019-08-20 DIAGNOSIS — I10 Essential (primary) hypertension: Secondary | ICD-10-CM

## 2019-08-20 DIAGNOSIS — E662 Morbid (severe) obesity with alveolar hypoventilation: Secondary | ICD-10-CM

## 2019-08-20 NOTE — Telephone Encounter (Signed)
[employer] denied cpap titration.Not sure why PSG was approved except we were looking for restless legs or tachycardia and none was shown.  Need patient set up on auto cpap.

## 2019-08-20 NOTE — Telephone Encounter (Signed)
-----   Message from Melvyn Novas, MD sent at 08/17/2019 11:29 AM EDT ----- IMPRESSION:  1. Moderate severe Obstructive Sleep Apnea with hypopneas  dominating the AHI of  29.7/h(OSA), supine sleep accentuated the  AHI and REM AHI was 68/h , indicating REM sleep dependence.  2. Clinically insignificant degree of Periodic Limb Movement  Disorder (PLMD).  3. Prolonged hypoxemia of 67 minutes within a TST of 341 minutes.    RECOMMENDATIONS:  1. Advise full night, attended, PAP titration study to optimize  therapy and have opportunity to add oxygen, if needed.  2. Dental devices will neither address/ control hypoxia nor REM  dependent apnea. Only PAP therapy can do achieve that.  3. Grade 3 obesity is contributing to the retinal changes as it  fosters DM and OSA.

## 2019-08-20 NOTE — Telephone Encounter (Signed)
[employer] denied the

## 2019-08-20 NOTE — Telephone Encounter (Signed)
I called pt. I advised pt that Dr. Dohmeier reviewed their sleep study results and found that moderate to severe sleep apnea and recommends that pt be treated with a cpap. Dr. Dohmeier recommends that pt return for a repeat sleep study in order to properly titrate the cpap and ensure a good mask fit. Pt is agreeable to returning for a titration study. I advised pt that our sleep lab will file with pt's insurance and call pt to schedule the sleep study when we hear back from the pt's insurance regarding coverage of this sleep study. Pt verbalized understanding of results. Pt had no questions at this time but was encouraged to call back if questions arise.   

## 2019-08-21 NOTE — Addendum Note (Signed)
Addended by: Melvyn Novas on: 08/21/2019 04:28 PM   Modules accepted: Orders

## 2019-08-21 NOTE — Telephone Encounter (Signed)
IMPRESSION:  1. Moderate severe Obstructive Sleep Apnea with hypopneas  dominating the AHI of  29.7/h(OSA), supine sleep accentuated the  AHI and REM AHI was 68/h , indicating REM sleep dependence.  2. Clinically insignificant degree of Periodic Limb Movement  Disorder (PLMD).  3. Prolonged hypoxemia of 67 minutes within a TST of 341 minutes.   Incomprehensible, that [employer] wouldn't allow in lab titration, but they denied. I will order autotitration device with [facility].   Order printed, 08-21-2019.

## 2019-08-22 NOTE — Telephone Encounter (Signed)
I called pt. Advised insurance would not cover the titration study. Dr. Vickey Huger recommends that pt starts auto CPAP. I reviewed PAP compliance expectations with the pt. Pt is agreeable to starting a CPAP. I advised pt that an order will be sent to a DME, [facility] and [facility] will call the pt within about one week after they file with the pt's insurance. [facility]) will show the pt how to use the machine, fit for masks, and troubleshoot the CPAP if needed. A follow up appt was made for insurance purposes with Dr. Vickey Huger on Oct 21,2021 at 1:30 pm. Pt verbalized understanding to arrive 15 minutes early and bring their CPAP. A letter with all of this information in it will be mailed to the pt as a reminder. I verified with the pt that the address we have on file is correct. Pt verbalized understanding of results. Pt had no questions at this time but was encouraged to call back if questions arise. I have sent the order to [facility]) and have received confirmation that they have received the order.

## 2019-08-23 ENCOUNTER — Ambulatory Visit: Payer: BC Managed Care – PPO | Admitting: Neurology

## 2019-08-23 ENCOUNTER — Ambulatory Visit: Payer: Self-pay | Admitting: Neurology

## 2019-08-24 DIAGNOSIS — R079 Chest pain, unspecified: Secondary | ICD-10-CM

## 2019-08-24 DIAGNOSIS — R0600 Dyspnea, unspecified: Secondary | ICD-10-CM

## 2019-08-24 DIAGNOSIS — E113591 Type 2 diabetes mellitus with proliferative diabetic retinopathy without macular edema, right eye: Secondary | ICD-10-CM | POA: Diagnosis not present

## 2019-08-24 DIAGNOSIS — R072 Precordial pain: Secondary | ICD-10-CM

## 2019-08-24 DIAGNOSIS — E113532 Type 2 diabetes mellitus with proliferative diabetic retinopathy with traction retinal detachment not involving the macula, left eye: Secondary | ICD-10-CM | POA: Diagnosis not present

## 2019-08-24 DIAGNOSIS — Z8616 Personal history of COVID-19: Secondary | ICD-10-CM

## 2019-08-24 DIAGNOSIS — R0609 Other forms of dyspnea: Secondary | ICD-10-CM

## 2019-08-27 ENCOUNTER — Other Ambulatory Visit: Payer: Self-pay

## 2019-08-27 ENCOUNTER — Ambulatory Visit (INDEPENDENT_AMBULATORY_CARE_PROVIDER_SITE_OTHER): Payer: BC Managed Care – PPO | Admitting: Podiatry

## 2019-08-27 DIAGNOSIS — E119 Type 2 diabetes mellitus without complications: Secondary | ICD-10-CM

## 2019-08-28 ENCOUNTER — Encounter: Payer: Self-pay | Admitting: Podiatry

## 2019-08-28 NOTE — Progress Notes (Signed)
Subjective: Karina [name] presents today referred by Wilber Oliphant, MD for diabetic foot evaluation.  Patient relates greater than 3 year history of diabetes.  Patient denies any history of foot wounds.  Patient occasional history of numbness, tingling, burning, pins/needles sensations.  Past Medical History:  Diagnosis Date  . Acute pain of right shoulder 03/13/2019  . Back pain 12/12/2017  . Carpal tunnel syndrome of right wrist 03/13/2019  . Depression 12/27/2012  . Diabetes mellitus    Type 2, insulin resistant  . Diabetic neuropathy, type II diabetes mellitus ([place]) 05/23/2013  . DOE (dyspnea on exertion) 05/21/2019   Onset with covid 19 infection symptoms started 01/16/19  -  05/23/2019   Walked RA  3 laps @ approx 223f each @ moderat pace  stopped due to end of study,  no limiting sob/ sats 100% at end despite reported sob at rest   . Essential hypertension, benign 12/12/2013  . History of COVID-19 03/22/2019  . Lower extremity edema 08/29/2017  . Moderate nonproliferative diabetic retinopathy ([place] 05/24/2013   Both OS and OD.  Diagnosed by Dr. SLoyal Buba MD, PhD 05/23/13. Also mild macular edema. Follows with Dr. GKaty Fitch . Morbid obesity ([facility] 05/25/2013  . Nephrotic syndrome due to diabetes mellitus ([place] 04/10/2019  . 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 Shortness of breath 03/22/2019  . Syncope 04/08/2015  . Tachycardia 05/21/2019  . 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 Upper airway cough syndrome 03/22/2019    Patient Active Problem List   Diagnosis Date Noted  . Uncontrolled type 2 diabetes mellitus with hyperglycemia ([place] 08/17/2019  . Current mild episode of major depressive disorder ([place] 08/08/2019  . Toenail torn away 08/08/2019  . Sleep-related headache 07/05/2019  . 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 Class 3 obesity with alveolar hypoventilation without serious comorbidity with body mass index (BMI) of 45.0 to 49.9 in adult (H[place] 07/05/2019  . 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, bilateral ([facility] 07/05/2019  . Cephalalgia 07/05/2019  . Tension headache 06/23/2019  . Proliferative diabetic retinopathy of right eye ([place] 06/20/2019  . Proliferative diabetic retinopathy of left eye ([facility] 06/20/2019  . Vitreous hemorrhage of left eye ([facility] 06/20/2019  . DOE (dyspnea on exertion) 05/21/2019  . Tachycardia 05/21/2019  . Nephrotic syndrome due to diabetes mellitus ([place] 04/10/2019  . History of COVID-19 03/22/2019  . Upper airway cough syndrome 03/22/2019  . Shortness of breath 03/22/2019  . Carpal tunnel syndrome of right wrist 03/13/2019  . Acute pain of right shoulder 03/13/2019  . COVID-19 01/24/2019  . Gastroesophageal reflux disease 03/23/2018  . Hoarse 03/23/2018  . Referred ear pain, bilateral 03/23/2018  . Back pain 12/12/2017  . Lower extremity edema 08/29/2017  . 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 Hypertension associated with stage 2 chronic kidney disease due to type 2 diabetes mellitus (HS[place] 12/12/2013  . Morbid obesity (H[place] 05/25/2013  . Diabetic neuropathy, type II diabetes mellitus (HL[place] 05/23/2013  . Type 2 diabetes mellitus with both eyes affected by moderate nonproliferative retinopathy and macular edema, with long-term current use of insulin (HH[place] 12/27/2012  . Depression 12/27/2012  . Pre-existing diabetes mellitus in pregnancy 02/11/2012    Past Surgical History:  Procedure Laterality Date  . CERVICAL BIOPSY  2004  . CESAREAN SECTION  N/A 06/02/2012   Procedure: CESAREAN SECTION;  Surgeon: Mora Bellman, MD;  Location: [facility];  Service: Obstetrics;  Laterality: N/A;    Current Outpatient Medications on File Prior to Visit  Medication Sig Dispense Refill  . albuterol (VENTOLIN  HFA) 108 (90 Base) MCG/ACT inhaler Inhale 2 puffs into the lungs every 4 (four) hours as needed for wheezing or shortness of breath. 18 g 0  . benzonatate (TESSALON) 200 MG capsule Take three times a day until no longer coughing, then take as needed 45 capsule 1  . Blood Pressure Monitoring (BLOOD PRESSURE MONITOR AUTOMAT) DEVI 1 kit by Does not apply route daily. Please call the office if you have any issues obtaining this monitor. 1 each 0  . buPROPion (WELLBUTRIN XL) 150 MG 24 hr tablet Take 1 tablet (150 mg total) by mouth daily. 30 tablet 5  . Continuous Blood Gluc Receiver (DEXCOM G6 RECEIVER) DEVI 1 Device by Does not apply route See admin instructions. 1 each 0  . Continuous Blood Gluc Sensor (DEXCOM G6 SENSOR) MISC 1 each by Does not apply route See admin instructions. Apply new sensor every 10 days. 9 each 2  . Continuous Blood Gluc Transmit (DEXCOM G6 TRANSMITTER) MISC 1 each by Does not apply route every 3 (three) months. 1 each 2  . cyclobenzaprine (FLEXERIL) 10 MG tablet Take 1 tablet (10 mg total) by mouth 3 (three) times daily as needed for muscle spasms. 30 tablet 0  . fluticasone (FLONASE) 50 MCG/ACT nasal spray Place 2 sprays into both nostrils daily. 16 g 6  . furosemide (LASIX) 20 MG tablet Take 20 mg by mouth daily.    . insulin degludec (TRESIBA FLEXTOUCH) 200 UNIT/ML FlexTouch Pen Inject 50 Units into the skin daily. 9 pen 3  . insulin lispro (HUMALOG KWIKPEN) 100 UNIT/ML KwikPen Inject 0.2-0.28 mLs (20-28 Units total) into the skin 3 (three) times daily. 45 mL 3  . Insulin Pen Needle (B-D ULTRAFINE III SHORT PEN) 31G X 8 MM MISC 1 Container by Does not apply route as needed. 1 each 11  . Insulin Syringe-Needle U-100 (ULTICARE INSULIN SYRINGE) 31G X 5/16" 1 ML MISC Use to inject insulin. 200 each 1  . losartan (COZAAR) 100 MG tablet Take 100 mg by mouth daily.    . pantoprazole (PROTONIX) 40 MG tablet Take 30- 60 min before your first and last meals of the day 60 tablet 2  .  predniSONE (DELTASONE) 50 MG tablet Take 1 tablet ('50mg'$ ) 13 hours before cardiac CT scan. Take 1 tablet ('50mg'$ ) 7 hours before cardiac CT scan. Take 1 tablet ('50mg'$ ) AND '50mg'$  benadryl 1 hour before cardiac CT scan. 3 tablet 0  . [DISCONTINUED] gabapentin (NEURONTIN) 100 MG capsule Take 1 capsule (100 mg total) by mouth at bedtime. 90 capsule 3   No current facility-administered medications on file prior to visit.     Allergies  Allergen Reactions  . Lisinopril Cough  . Eggs Or Egg-Derived Products Nausea And Vomiting  . Contrast Media [Iodinated Diagnostic Agents]     Social History   Occupational History  . Not on file  Tobacco Use  . Smoking status: Never Smoker  . Smokeless tobacco: Never Used  Vaping Use  . Vaping Use: Never used  Substance and Sexual Activity  . Alcohol use: No  . Drug use: No  . Sexual activity: Yes    Birth control/protection: None    Family History  Problem Relation Age of Onset  . Diabetes Father   .  Cancer Mother   . Diabetes Mother     Immunization History  Administered Date(s) Administered  . Influenza Split 12/20/2011  . Influenza,inj,Quad PF,6+ Mos 11/10/2015  . MMR 06/03/2012  . PFIZER SARS-COV-2 Vaccination 04/04/2019, 04/25/2019  . Tdap 03/13/2012    Review of systems: Positive Findings in bold print.  Constitutional:  chills, fatigue, fever, sweats, weight change Communication: Optometrist, sign Ecologist, hand writing, iPad/Android device Head: headaches, head injury Eyes: changes in vision, eye pain, glaucoma, cataracts, macular degeneration, diplopia, glare,  light sensitivity, eyeglasses or contacts, blindness Ears nose mouth throat: hearing impaired, hearing aids,  ringing in ears, deaf, sign language,  vertigo, nosebleeds,  rhinitis,  cold sores, snoring, swollen glands Cardiovascular: HTN, edema, arrhythmia, pacemaker in place, defibrillator in place, chest pain/tightness, chronic anticoagulation, blood clot, heart  failure, MI Peripheral Vascular: leg cramps, varicose veins, blood clots, lymphedema, varicosities Respiratory:  asthma, difficulty breathing, denies congestion, SOB, wheezing, cough, emphysema Gastrointestinal: change in appetite or weight, abdominal pain, constipation, diarrhea, nausea, vomiting, vomiting blood, change in bowel habits, abdominal pain, jaundice, rectal bleeding, hemorrhoids, GERD Genitourinary:  nocturia,  pain on urination, polyuria,  blood in urine, Foley catheter, urinary urgency, ESRD on hemodialysis Musculoskeletal: amputation, cramping, stiff joints, painful joints, decreased joint motion, fractures, OA, gout, hemiplegia, paraplegia, uses cane, wheelchair bound, uses walker, uses rollator Skin: +changes in toenails, color change, dryness, itching, mole changes,  rash, wound(s) Neurological: headaches, numbness in feet, paresthesias in feet, burning in feet, fainting,  seizures, change in speech, migraines, memory problems/poor historian, cerebral palsy, weakness, paralysis, CVA, TIA Endocrine: diabetes, hypothyroidism, hyperthyroidism,  goiter, dry mouth, flushing, heat intolerance, cold intolerance,  excessive thirst, denies polyuria,  nocturia Hematological:  easy bleeding, excessive bleeding, easy bruising, enlarged lymph nodes, on long term blood thinner, history of past transusions Allergy/immunological:  hives, eczema, frequent infections, multiple drug allergies, seasonal allergies, transplant recipient, multiple food allergies Psychiatric:  anxiety, depression, mood disorder, suicidal ideations, hallucinations, insomnia  Objective: There were no vitals filed for this visit. Vascular Examination: Capillary refill time less than 3 seconds x 10 digits.  Dorsalis pedis pulses palpable.  Posterior tibial pulses palpable.  Digital hair present x 10 digits.  Skin temperature gradient WNL b/l.  Dermatological Examination: Skin with normal turgor, texture and tone  b/l  Toenails 1-5 b/l discolored, thick, dystrophic with subungual debris and pain with palpation to nailbeds due to thickness of nails.  Musculoskeletal: Muscle strength 5/5 to all LE muscle groups.  Neurological: Sensation intact with 10 gram monofilament.  Vibratory sensation intact.  Assessment: 1. NIDDM 2. Encounter for diabetic foot examination  Plan: 1. Discussed diabetic foot care principles. Literature dispensed on today. 2. Patient to continue soft, supportive shoe gear daily. 3. Patient to report any pedal injuries to medical professional immediately. 4. Follow up one year. 5. Patient/POA to call should there be a concern in the interim.

## 2019-08-28 NOTE — Telephone Encounter (Signed)
Karina [name] is calling wanting to know if Dr. Jacques Navy ever replied to her message. I advised her of Dr. Lupe Carney response and she stated she would like to do another form of test other than the CT. I advised her I would send a message and have someone contact her in regards to what test and scheduling. Please advise.

## 2019-08-29 NOTE — Telephone Encounter (Signed)
Parke Poisson, MD  You; Tobin Chad, RN 3 minutes ago (3:49 PM)   Treadmill stress nuc. If she thinks she can't do a treadmill, pharmacologic nuclear study.  GA

## 2019-08-30 ENCOUNTER — Telehealth: Payer: BC Managed Care – PPO | Admitting: Internal Medicine

## 2019-08-31 ENCOUNTER — Ambulatory Visit (INDEPENDENT_AMBULATORY_CARE_PROVIDER_SITE_OTHER): Payer: BC Managed Care – PPO | Admitting: Family Medicine

## 2019-08-31 ENCOUNTER — Other Ambulatory Visit: Payer: Self-pay

## 2019-08-31 ENCOUNTER — Encounter: Payer: Self-pay | Admitting: Family Medicine

## 2019-08-31 VITALS — BP 142/86 | HR 95 | Ht 62.0 in | Wt 258.8 lb

## 2019-08-31 DIAGNOSIS — E1165 Type 2 diabetes mellitus with hyperglycemia: Secondary | ICD-10-CM | POA: Diagnosis not present

## 2019-08-31 DIAGNOSIS — Z794 Long term (current) use of insulin: Secondary | ICD-10-CM | POA: Diagnosis not present

## 2019-08-31 DIAGNOSIS — E118 Type 2 diabetes mellitus with unspecified complications: Secondary | ICD-10-CM | POA: Diagnosis not present

## 2019-08-31 DIAGNOSIS — IMO0002 Reserved for concepts with insufficient information to code with codable children: Secondary | ICD-10-CM

## 2019-08-31 DIAGNOSIS — E113313 Type 2 diabetes mellitus with moderate nonproliferative diabetic retinopathy with macular edema, bilateral: Secondary | ICD-10-CM

## 2019-08-31 DIAGNOSIS — Z1159 Encounter for screening for other viral diseases: Secondary | ICD-10-CM

## 2019-08-31 DIAGNOSIS — E559 Vitamin D deficiency, unspecified: Secondary | ICD-10-CM

## 2019-08-31 MED ORDER — HUMALOG KWIKPEN 200 UNIT/ML ~~LOC~~ SOPN: 20.0000 [IU] | PEN_INJECTOR | Freq: Three times a day (TID) | SUBCUTANEOUS | 0 refills | Status: DC

## 2019-08-31 MED ORDER — TRESIBA FLEXTOUCH 200 UNIT/ML ~~LOC~~ SOPN: 50.0000 [IU] | PEN_INJECTOR | Freq: Every day | SUBCUTANEOUS | 0 refills | Status: DC

## 2019-08-31 NOTE — Patient Instructions (Signed)
Diabetes: make another appointment to go through your medications. We'll find out what your insurance covers. Continue taking your blood sugars - Fast and midday and after.   Vitamin D: checking levels today   Bump on Breast: continue to monitor.

## 2019-08-31 NOTE — Progress Notes (Signed)
    SUBJECTIVE:   CHIEF COMPLAINT / HPI:   Depression/Anxiety  Would like to come off of the wellbutrin as she has had been having high BP at home. She would like to have her vitamin D check today as she knows that that can lead to some low moods.   DM, poorly controlled  Would like to transition her DM managenent here as we are her primary care as she reports it is just easier since she is getting the remainder of her care here. Last seen at [facility] endocrinology . Low on insulin. Reli on monitor has been 230's at home on average. She is checking her sugars 2-3 x most days. Usually fasting and after lunch.   PERTINENT  PMH / PSH: Vitmain D Deficiency   OBJECTIVE:   BP (!) 142/86   Pulse 95   Ht 5\' 2"  (1.575 m)   Wt 258 lb 12.8 oz (117.4 kg)   LMP 08/07/2019 (Exact Date)   SpO2 98%   BMI 47.34 kg/m   General: well appearing female. No changes in appearance since last exam.   ASSESSMENT/PLAN:   Type 2 diabetes mellitus with both eyes affected by moderate nonproliferative retinopathy and macular edema, with long-term current use of insulin [facility]) Patient requesting to switch DM management to [facility]. Patient not seen by [facility] endocrinology since April 2021. In Dr. May 2021 notes, pt is ntoed to be noncompliant with DM sugar checks, medicaitons and appointments. Patient to make an appointment to focus solely on diabetes management. I have asked patient to record sugars three x a day for two weeks until next appointment to help with adjusting medications. Patient would benefit from CCM -- will ask at next DM visit.  SGLT2 or GLP1 at next visit.   Vitamin D deficiency Hx for severe vit D deficiency. On recheck, level is < 4.0. Will supplement with 50,000 IU weekly 6-8 weeks, then 800 IU D3 daily. Recheck in 8-12 weeks.  Consider etiology 2/2 worsening renal status, malabsorption, hx of nephrotic sdm?. Calcium levels are normal.    Follow up lasix (Dr. 10-12).  Patient has follow up with  [facility] September 1st at 11AM  Upcoming ophth appointment on 09/06/19.   PAP at next visit   09/08/19, MD [facility]

## 2019-09-01 LAB — BASIC METABOLIC PANEL
BUN/Creatinine Ratio: 15 (ref 9–23)
BUN: 20 mg/dL (ref 6–20)
CO2: 24 mmol/L (ref 20–29)
Calcium: 8.7 mg/dL (ref 8.7–10.2)
Chloride: 101 mmol/L (ref 96–106)
Creatinine, Ser: 1.35 mg/dL — ABNORMAL HIGH (ref 0.57–1.00)
GFR calc Af Amer: 58 mL/min/{1.73_m2} — ABNORMAL LOW (ref >59)
GFR calc non Af Amer: 51 mL/min/{1.73_m2} — ABNORMAL LOW (ref >59)
Glucose: 305 mg/dL — ABNORMAL HIGH (ref 65–99)
Potassium: 4.3 mmol/L (ref 3.5–5.2)
Sodium: 136 mmol/L (ref 134–144)

## 2019-09-01 LAB — VITAMIN D 25 HYDROXY (VIT D DEFICIENCY, FRACTURES): Vit D, 25-Hydroxy: <4 ng/mL — ABNORMAL LOW (ref 30.0–100.0)

## 2019-09-01 LAB — HEPATITIS C ANTIBODY: Hep C Virus Ab: <0.1 s/co ratio (ref 0.0–0.9)

## 2019-09-03 DIAGNOSIS — Z0271 Encounter for disability determination: Secondary | ICD-10-CM

## 2019-09-04 ENCOUNTER — Institutional Professional Consult (permissible substitution): Payer: BC Managed Care – PPO | Admitting: Pulmonary Disease

## 2019-09-04 ENCOUNTER — Encounter: Payer: Self-pay | Admitting: Family Medicine

## 2019-09-04 DIAGNOSIS — E559 Vitamin D deficiency, unspecified: Secondary | ICD-10-CM | POA: Insufficient documentation

## 2019-09-04 NOTE — Assessment & Plan Note (Addendum)
Patient requesting to switch DM management to [facility]. Patient not seen by [facility] endocrinology since April 2021. In Dr. Lavella Hammock notes, pt is ntoed to be noncompliant with DM sugar checks, medicaitons and appointments. Patient to make an appointment to focus solely on diabetes management. I have asked patient to record sugars three x a day for two weeks until next appointment to help with adjusting medications. Patient would benefit from CCM -- will ask at next DM visit.  SGLT2 or GLP1 at next visit.

## 2019-09-04 NOTE — Assessment & Plan Note (Addendum)
Hx for severe vit D deficiency. On recheck, level is < 4.0. Will supplement with 50,000 IU weekly 6-8 weeks, then 800 IU D3 daily. Recheck in 8-12 weeks.  Consider etiology 2/2 worsening renal status, malabsorption, hx of nephrotic sdm?. Calcium levels are normal.

## 2019-09-04 NOTE — Progress Notes (Signed)
Virtual Visit via Telephone Note   This visit type was conducted due to national recommendations for restrictions regarding the COVID-19 Pandemic (e.g. social distancing) in an effort to limit this patient's exposure and mitigate transmission in our community.  Due to her co-morbid illnesses, this patient is at least at moderate risk for complications without adequate follow up.  This format is felt to be most appropriate for this patient at this time.  The patient did not have access to video technology/had technical difficulties with video requiring transitioning to audio format only (telephone).  All issues noted in this document were discussed and addressed.  No physical exam could be performed with this format.  Please refer to the patient's chart for her  consent to telehealth for [facility].    Date:  09/04/2019   ID:  Karina [name], DOB 07-03-1985, MRN [id number] The patient was identified using 2 identifiers.  Patient Location: Home Provider Location: Home Office  PCP:  Wilber Oliphant, MD  Cardiologist:  No primary care provider on file.  Electrophysiologist:  None   Evaluation Performed:  Follow-Up Visit  Chief Complaint:  Chest pain follow up  History of Present Illness:    Karina [name] is a 34 y.o. female with DM2 with neuropathy and retinopathy, DOE after COVID 19 infection, HTN, LE edema, obesity, syncope and tachycardia. She presents for evaluation of chest pain and DOE.    She is still experiencing swelling, but has noticed that she is less winded now.  Her chest pain is less noticeable and also is improving.  The episodes are fewer and farther between.  She notes the discomfort in the middle of her chest and thinks it may be related to acid reflux.  She has, by her report, complex eye surgery for retinal detachment upcoming.  She will need general anesthesia.  She has informed her ophthalmologist that she is concerned about her heart and would like to complete  her testing prior to undergoing surgery.  We discussed in detail that if her symptoms are improving, it may very well be secondary to recovering from COVID-19 infection.  She has risk factors including diabetes with complication, hypertension, obesity that do put her at risk for coronary disease.  We participated in shared decision-making and reviewed this in detail.  We were unable to complete her coronary CTA due to inability to optimize her heart rate and obtain diagnostic quality images.  I have given her the option of pursuing nuclear stress testing instead.  Since her symptoms are improving this is not mandatory prior to eye surgery, however the patient feels very strongly that she would like to know if she has ischemia prior to surgery.  With this in mind, I am happy to arrange.  With lower extremity swelling, she had not continue to be Lasix 20 mg daily but did feel it was helping.  We will refill this today.  The patient does not have symptoms concerning for COVID-19 infection (fever, chills, cough, or new shortness of breath).    Past Medical History:  Diagnosis Date   Acute pain of right shoulder 03/13/2019   Back pain 12/12/2017   Carpal tunnel syndrome of right wrist 03/13/2019   Depression 12/27/2012   Diabetes mellitus    Type 2, insulin resistant   Diabetic neuropathy, type II diabetes mellitus ([place]) 05/23/2013   DOE (dyspnea on exertion) 05/21/2019   Onset with covid 19 infection symptoms started 01/16/19  -  05/23/2019   Walked  RA  3 laps @ approx 236ft each @ moderat pace  stopped due to end of study,  no limiting sob/ sats 100% at end despite reported sob at rest    Essential hypertension, benign 12/12/2013   History of COVID-19 03/22/2019   Lower extremity edema 08/29/2017   Moderate nonproliferative diabetic retinopathy ([place]) 05/24/2013   Both OS and OD.  Diagnosed by Dr. Loyal Buba, MD, PhD 05/23/13. Also mild macular edema. Follows with Dr. Katy Fitch   Morbid obesity ([place])  05/25/2013   Nephrotic syndrome due to diabetes mellitus ([place]) 04/10/2019   Notalgia 09/11/2015   Shortness of breath 03/22/2019   Syncope 04/08/2015   Tachycardia 05/21/2019   Type 2 diabetes mellitus with both eyes affected by moderate nonproliferative retinopathy and macular edema, with long-term current use of insulin ([place]) 12/27/2012        Upper airway cough syndrome 03/22/2019   Past Surgical History:  Procedure Laterality Date   CERVICAL BIOPSY  2004   CESAREAN SECTION N/A 06/02/2012   Procedure: CESAREAN SECTION;  Surgeon: Mora Bellman, MD;  Location: [facility];  Service: Obstetrics;  Laterality: N/A;     Current Meds  Medication Sig   benzonatate (TESSALON) 200 MG capsule Take three times a day until no longer coughing, then take as needed   Blood Pressure Monitoring (BLOOD PRESSURE MONITOR AUTOMAT) DEVI 1 kit by Does not apply route daily. Please call the office if you have any issues obtaining this monitor.   insulin degludec (TRESIBA FLEXTOUCH) 200 UNIT/ML FlexTouch Pen Inject 50 Units into the skin daily for 28 days.   insulin lispro (HUMALOG KWIKPEN) 200 UNIT/ML KwikPen Inject 20-28 Units into the skin 3 (three) times daily.   Insulin Pen Needle (B-D ULTRAFINE III SHORT PEN) 31G X 8 MM MISC 1 Container by Does not apply route as needed.   losartan (COZAAR) 100 MG tablet Take 100 mg by mouth daily.   pantoprazole (PROTONIX) 40 MG tablet Take 30- 60 min before your first and last meals of the day   predniSONE (DELTASONE) 50 MG tablet Take 1 tablet ($RemoveB'50mg'igEAVjwb$ ) 13 hours before cardiac CT scan. Take 1 tablet ($RemoveB'50mg'DzcrwFSN$ ) 7 hours before cardiac CT scan. Take 1 tablet ($RemoveB'50mg'mCAEikmV$ ) AND $Rem'50mg'tpLf$  benadryl 1 hour before cardiac CT scan.   Vitamin D, Ergocalciferol, (DRISDOL) 1.25 MG (50000 UNIT) CAPS capsule Take 1 capsule (50,000 Units total) by mouth every 7 (seven) days.     Allergies:   Lisinopril, Eggs or egg-derived products, and Contrast media [iodinated diagnostic agents]   Social  History   Tobacco Use   Smoking status: Never Smoker   Smokeless tobacco: Never Used  Vaping Use   Vaping Use: Never used  Substance Use Topics   Alcohol use: No   Drug use: No     Family Hx: The patient's family history includes Cancer in her mother; Diabetes in her father and mother.  ROS:   Please see the history of present illness.     All other systems reviewed and are negative.   Prior CV studies:   The following studies were reviewed today:  Echocardiogram  Labs/Other Tests and Data Reviewed:    EKG:  No ECG reviewed.  Recent Labs: 05/24/2019: Hemoglobin 10.8; Platelets 415.0; Pro B Natriuretic peptide (BNP) 27.0; TSH 2.46 08/31/2019: BUN 20; Creatinine, Ser 1.35; Potassium 4.3; Sodium 136   Recent Lipid Panel Lab Results  Component Value Date/Time   CHOL 208 (H) 09/14/2018 03:02 PM   TRIG 190 (H) 09/14/2018 03:02 PM  HDL 52 09/14/2018 03:02 PM   CHOLHDL 4.0 09/14/2018 03:02 PM   CHOLHDL 3.4 03/21/2015 12:30 PM   LDLCALC 123 (H) 09/14/2018 03:02 PM    Wt Readings from Last 3 Encounters:  08/31/19 258 lb 12.8 oz (117.4 kg)  08/08/19 (!) 259 lb 6.4 oz (117.7 kg)  07/11/19 256 lb (116.1 kg)     Objective:    Vital Signs:  LMP 08/07/2019 (Exact Date)  recent BP 142/86  VITAL SIGNS:  reviewed GEN:  no acute distress RESPIRATORY:  normal respiratory effort, no increased work of breathing NEURO:  alert and oriented x 3, speech normal PSYCH:  normal affect   ASSESSMENT & PLAN:    1. DOE (dyspnea on exertion)   2. Precordial pain   3. History of COVID-19   4. Swelling of both lower extremities   5. Hypertension associated with stage 2 chronic kidney disease due to type 2 diabetes mellitus (HCC)    Chest pain - she is quite keen to have a stress test before undergoing general anesthesia. She had chest pain previously, and it is reasonable given her risk factors to complete an ischemic evaluation. She has been told her surgery will be long, though  it is unclear the duration. We will order stress myoview.   HTN - resume lasix for swelling. Continue losartan. Will likely need an additional agent for BP control, not currently at goal.   COVID-19 Education: The signs and symptoms of COVID-19 were discussed with the patient and how to seek care for testing (follow up with PCP or arrange E-visit).  The importance of social distancing was discussed today.  Time:   Today, I have spent 20 minutes total encounter time, >50% with the patient with telehealth technology discussing the above problems.     Medication Adjustments/Labs and Tests Ordered: Current medicines are reviewed at length with the patient today.  Concerns regarding medicines are outlined above.   Tests Ordered: Orders Placed This Encounter  Procedures   MYOCARDIAL PERFUSION IMAGING    Medication Changes: Meds ordered this encounter  Medications   furosemide (LASIX) 20 MG tablet    Sig: Take 1 tablet (20 mg total) by mouth daily.    Dispense:  90 tablet    Refill:  3    Follow Up: after stress test  Signed, Elouise Munroe, MD  09/04/2019 3:33 PM    [facility]

## 2019-09-05 ENCOUNTER — Encounter: Payer: Self-pay | Admitting: Family Medicine

## 2019-09-06 ENCOUNTER — Telehealth (INDEPENDENT_AMBULATORY_CARE_PROVIDER_SITE_OTHER): Payer: BC Managed Care – PPO | Admitting: Internal Medicine

## 2019-09-06 ENCOUNTER — Encounter: Payer: Self-pay | Admitting: Family Medicine

## 2019-09-06 DIAGNOSIS — Z8616 Personal history of COVID-19: Secondary | ICD-10-CM | POA: Diagnosis not present

## 2019-09-06 DIAGNOSIS — R0609 Other forms of dyspnea: Secondary | ICD-10-CM

## 2019-09-06 DIAGNOSIS — M7989 Other specified soft tissue disorders: Secondary | ICD-10-CM

## 2019-09-06 DIAGNOSIS — R072 Precordial pain: Secondary | ICD-10-CM

## 2019-09-06 DIAGNOSIS — N182 Chronic kidney disease, stage 2 (mild): Secondary | ICD-10-CM

## 2019-09-06 DIAGNOSIS — R0600 Dyspnea, unspecified: Secondary | ICD-10-CM

## 2019-09-06 DIAGNOSIS — E1122 Type 2 diabetes mellitus with diabetic chronic kidney disease: Secondary | ICD-10-CM

## 2019-09-06 DIAGNOSIS — I129 Hypertensive chronic kidney disease with stage 1 through stage 4 chronic kidney disease, or unspecified chronic kidney disease: Secondary | ICD-10-CM

## 2019-09-06 MED ORDER — FUROSEMIDE 20 MG PO TABS
20.0000 mg | ORAL_TABLET | Freq: Every day | ORAL | 3 refills | Status: DC
Start: 2019-09-06 — End: 2020-02-25

## 2019-09-06 MED ORDER — VITAMIN D (ERGOCALCIFEROL) 1.25 MG (50000 UNIT) PO CAPS: 50000.0000 [IU] | ORAL_CAPSULE | ORAL | 0 refills | Status: DC

## 2019-09-06 NOTE — Patient Instructions (Addendum)
Medication Instructions:  Restart lasix 20 mg daily   *If you need a refill on your cardiac medications before your next appointment, please call your pharmacy*   Lab Work: None    Testing/Procedures: Your physician has requested that you have en exercise stress myoview. For further information please visit [email or web address] Please follow instruction sheet, as given [address]  Follow-Up: At [facility], you and your health needs are our priority.  As part of our continuing mission to provide you with exceptional heart care, we have created designated Provider Care Teams.  These Care Teams include your primary Cardiologist (physician) and Advanced Practice Providers (APPs -  Physician Assistants and Nurse Practitioners) who all work together to provide you with the care you need, when you need it.  We recommend signing up for the patient portal called "MyChart".  Sign up information is provided on this After Visit Summary.  MyChart is used to connect with patients for Virtual Visits (Telemedicine).  Patients are able to view lab/test results, encounter notes, upcoming appointments, etc.  Non-urgent messages can be sent to your provider as well.   To learn more about what you can do with MyChart, go to [email or web address].    Your next appointment:   After test  The format for your next appointment:   Virtual Visit   Provider:   Weston Brass, MD  You are scheduled for a Myocardial Perfusion Imaging Study on.  Please arrive 15 minutes prior to your appointment time for registration and insurance purposes.  The test will take approximately 3 to 4 hours to complete; you may bring reading material.  If someone comes with you to your appointment, they will need to remain in the main lobby due to limited space in the testing area. **If you are pregnant or breastfeeding, please notify the nuclear lab prior to your appointment**  How to prepare for your  Myocardial Perfusion Test: . Do not eat or drink 3 hours prior to your test, except you may have water. . Do not consume products containing caffeine (regular or decaffeinated) 12 hours prior to your test. (ex: coffee, chocolate, sodas, tea). . Do bring a list of your current medications with you.  If not listed below, you may take your medications as normal. . Do wear comfortable clothes (no dresses or overalls) and walking shoes, tennis shoes preferred (No heels or open toe shoes are allowed). . Do NOT wear cologne, perfume, aftershave, or lotions (deodorant is allowed). . If these instructions are not followed, your test will have to be rescheduled.  Please report to [address], [address] for your test.  If you have questions or concerns about your appointment, you can call the Nuclear Lab at [phone].  If you cannot keep your appointment, please provide 24 hours notification to the Nuclear Lab, to avoid a possible $50 charge to your account.

## 2019-09-11 ENCOUNTER — Ambulatory Visit: Payer: BC Managed Care – PPO | Admitting: Family Medicine

## 2019-09-12 DIAGNOSIS — E1122 Type 2 diabetes mellitus with diabetic chronic kidney disease: Secondary | ICD-10-CM | POA: Diagnosis not present

## 2019-09-12 DIAGNOSIS — I129 Hypertensive chronic kidney disease with stage 1 through stage 4 chronic kidney disease, or unspecified chronic kidney disease: Secondary | ICD-10-CM | POA: Diagnosis not present

## 2019-09-12 DIAGNOSIS — N1831 Chronic kidney disease, stage 3a: Secondary | ICD-10-CM | POA: Diagnosis not present

## 2019-09-12 DIAGNOSIS — R809 Proteinuria, unspecified: Secondary | ICD-10-CM | POA: Diagnosis not present

## 2019-09-13 ENCOUNTER — Telehealth: Payer: Self-pay | Admitting: Family Medicine

## 2019-09-13 NOTE — Telephone Encounter (Signed)
Patient called to see if Dr. Selena Batten rcvd a fax from [facility], regarding disability paperwork.  Fax was not in PCP's box told pt to have [place] re-fax it.   Karina [name]

## 2019-09-18 DIAGNOSIS — Z6841 Body Mass Index (BMI) 40.0 and over, adult: Secondary | ICD-10-CM | POA: Diagnosis not present

## 2019-09-18 DIAGNOSIS — Z20822 Contact with and (suspected) exposure to covid-19: Secondary | ICD-10-CM | POA: Diagnosis not present

## 2019-09-20 ENCOUNTER — Other Ambulatory Visit (HOSPITAL_COMMUNITY)
Admission: RE | Admit: 2019-09-20 | Discharge: 2019-09-20 | Disposition: A | Discharge status: Home / Self Care | Payer: BC Managed Care – PPO | Source: Ambulatory Visit | Attending: Internal Medicine | Admitting: Internal Medicine

## 2019-09-20 ENCOUNTER — Telehealth (HOSPITAL_COMMUNITY): Payer: Self-pay

## 2019-09-20 DIAGNOSIS — Z20822 Contact with and (suspected) exposure to covid-19: Secondary | ICD-10-CM | POA: Diagnosis not present

## 2019-09-20 DIAGNOSIS — Z01812 Encounter for preprocedural laboratory examination: Secondary | ICD-10-CM | POA: Insufficient documentation

## 2019-09-20 LAB — SARS CORONAVIRUS 2 (TAT 6-24 HRS): SARS Coronavirus 2: NEGATIVE

## 2019-09-21 ENCOUNTER — Telehealth: Payer: Self-pay | Admitting: Internal Medicine

## 2019-09-21 NOTE — Telephone Encounter (Signed)
Returned call to patient. Patient calling in regards to disability forms. Patient stated she has been out of work since April 29 2019. Patient states she needs disability forms filled out for from then until 9/23 for after her cardiac testing (stress test). Patient states that her PCP put her out of work for heart and lung issues. Advised patient I would speak with Dr. Jacques Navy on Monday when she is back in clinic regarding her disability forms.

## 2019-09-21 NOTE — Telephone Encounter (Signed)
Follow up:    This patient would like to speak to concering hr paper work. Please call patient.

## 2019-09-24 ENCOUNTER — Ambulatory Visit (HOSPITAL_COMMUNITY): Payer: BC Managed Care – PPO | Attending: Cardiology

## 2019-09-24 ENCOUNTER — Other Ambulatory Visit: Payer: Self-pay

## 2019-09-24 DIAGNOSIS — R072 Precordial pain: Secondary | ICD-10-CM | POA: Diagnosis not present

## 2019-09-24 MED ORDER — TECHNETIUM TC 99M TETROFOSMIN IV KIT
31.7000 | PACK | Freq: Once | INTRAVENOUS | Status: AC | PRN
  Administered 2019-09-24: 31.7 via INTRAVENOUS
  Filled 2019-09-24: qty 32

## 2019-09-24 NOTE — Telephone Encounter (Signed)
i have [place] on the line requesting to speak with Dr. Jacques Navy or nurse about disability forms. Please call back

## 2019-09-24 NOTE — Telephone Encounter (Signed)
Spoke with Karina [name] regarding her need for her disability forms to be filled out. Patient stated that she just needed a comment on the forms stating that depending on the results on the stress test she may need a blood thinner which one delay her eye surgery which would hinder her from returning to work. Advised patient I would speak with Dr. Jacques Navy about this.

## 2019-09-24 NOTE — Telephone Encounter (Signed)
Patient is returning call.  °

## 2019-09-24 NOTE — Telephone Encounter (Signed)
Attempted to return call to patient. Left message for patient to call back.  

## 2019-09-25 ENCOUNTER — Other Ambulatory Visit: Payer: Self-pay

## 2019-09-25 ENCOUNTER — Ambulatory Visit (HOSPITAL_COMMUNITY): Payer: BC Managed Care – PPO | Attending: Cardiology

## 2019-09-25 LAB — MYOCARDIAL PERFUSION IMAGING
Estimated workload: 6.2 METS
Exercise duration (min): 5 min
LV dias vol: 69 mL (ref 46–106)
LV sys vol: 30 mL
MPHR: 184 {beats}/min
Peak HR: 162 {beats}/min
Percent HR: 88 %
Rest HR: 90 {beats}/min
SDS: 4
SRS: 0
SSS: 4
TID: 0.98

## 2019-09-25 MED ORDER — TECHNETIUM TC 99M TETROFOSMIN IV KIT
30.6000 | PACK | Freq: Once | INTRAVENOUS | Status: AC | PRN
  Administered 2019-09-25: 30.6 via INTRAVENOUS
  Filled 2019-09-25: qty 31

## 2019-10-02 ENCOUNTER — Other Ambulatory Visit: Payer: Self-pay

## 2019-10-02 ENCOUNTER — Encounter: Payer: Self-pay | Admitting: Family Medicine

## 2019-10-02 ENCOUNTER — Ambulatory Visit (INDEPENDENT_AMBULATORY_CARE_PROVIDER_SITE_OTHER): Payer: BC Managed Care – PPO | Admitting: Family Medicine

## 2019-10-02 VITALS — BP 128/82 | HR 99 | Ht 62.0 in | Wt 263.0 lb

## 2019-10-02 DIAGNOSIS — E1165 Type 2 diabetes mellitus with hyperglycemia: Secondary | ICD-10-CM | POA: Diagnosis not present

## 2019-10-02 DIAGNOSIS — E113313 Type 2 diabetes mellitus with moderate nonproliferative diabetic retinopathy with macular edema, bilateral: Secondary | ICD-10-CM

## 2019-10-02 DIAGNOSIS — E118 Type 2 diabetes mellitus with unspecified complications: Secondary | ICD-10-CM | POA: Diagnosis not present

## 2019-10-02 DIAGNOSIS — Z794 Long term (current) use of insulin: Secondary | ICD-10-CM | POA: Diagnosis not present

## 2019-10-02 DIAGNOSIS — IMO0002 Reserved for concepts with insufficient information to code with codable children: Secondary | ICD-10-CM

## 2019-10-02 DIAGNOSIS — Z8742 Personal history of other diseases of the female genital tract: Secondary | ICD-10-CM | POA: Diagnosis not present

## 2019-10-02 LAB — POCT GLYCOSYLATED HEMOGLOBIN (HGB A1C): HbA1c, POC (controlled diabetic range): 10.8 % — AB (ref 0.0–7.0)

## 2019-10-02 NOTE — Patient Instructions (Signed)
30 units of Tresiba once in the morning and once at night. Determined by fasting sugar  Continue 16-20 units Humalog with meals.   In two weeks, we will discuss the Freestyle.

## 2019-10-02 NOTE — Progress Notes (Addendum)
    SUBJECTIVE:  CHIEF COMPLAINT / HPI:   1. Uncontrolled type 2 diabetes mellitus with complication, with long-term current use of insulin (HCC) Current regimen Humalog w/ meals 20 units  Tresiba 50 units once daily usually at night.   CBGS: fasting, midday and before bed. Yesterday morning- 328  Last night before bed 356. Usually in the 300's. A couple of time, too high.   Feels sick at least once a week (nausea, malaise). Takes an extra 16-18 units and then goes away.   Was supposed to be taking Ozempic, d/c'ed due to $$.  Previously used Dexcom but reports having difficulty with accurate reads.   24 hours  0700 - to help daughter get ready for school  Breakfast: 8 grapes, water w/ lemon Lunch: Smoothie + celery sticks  Snack: low card tortilla with low sodium deli meat + romaine lettuce + apple juice  Dinner: mac and cheese ~ 1.5 servings + hot dog    2. Referral to OBGYN  Patient is not established with OBGYN and would like to establish. Also due to PAP smear     PERTINENT  PMH / PSH: Poorly controlled diabetes   OBJECTIVE:  BP 128/82   Pulse 99   Ht _0  (1.575 m)   Wt 263 lb (119.3 kg)   LMP 09/07/2019 (Exact Date)   SpO2 99%   BMI 48.10 kg/m   General: NAD, non-toxic, well-appearing, sitting comfortably in chair. Patient wearing sunglasses through encounter  HEENT: [place]/AT. PERRLA. EOMI.  Cardiovascular: RRR, normal S1, S2. B/L 2+ RP. No BLEE Respiratory: CTAB. No IWOB.  Abdomen: + BS. NT, ND, soft to palpation.    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 Given patient's history of non-compliance, focus today will be on simple intervention, SMART Goals and close follow up with patient.  Patient ultimate goal to get to insulin pump; however, she has a long way to go before she is a candidate for this. Therefore,  strict CBG monitoring while we titrate her insulins will be important.   Patient would like to discuss CGM at her next visit to limit needle sticks (as this has kept her from compliance in the past).   Goals:  1. Compliance:  Take humalog with every meal. Make meals at a time that works for her. No sliding scale at this time.  2. Fasting CBGs to titrate tresiba. Titrate 2 units daily every day her fast CBG is > 200.   Follow up in two weeks   - Reveiwed diet with patient - she would benefit from DM education as her last was several years ago.  - No refills needed today  - f/u in two weeks    Referral to OBGYN to establish gyn care    Wilber Oliphant, MD [place]

## 2019-10-03 ENCOUNTER — Telehealth (INDEPENDENT_AMBULATORY_CARE_PROVIDER_SITE_OTHER): Payer: BC Managed Care – PPO | Admitting: Internal Medicine

## 2019-10-03 VITALS — Ht 62.0 in | Wt 263.0 lb

## 2019-10-03 DIAGNOSIS — Z01810 Encounter for preprocedural cardiovascular examination: Secondary | ICD-10-CM

## 2019-10-03 DIAGNOSIS — Z79899 Other long term (current) drug therapy: Secondary | ICD-10-CM

## 2019-10-03 DIAGNOSIS — R0609 Other forms of dyspnea: Secondary | ICD-10-CM

## 2019-10-03 DIAGNOSIS — R072 Precordial pain: Secondary | ICD-10-CM | POA: Diagnosis not present

## 2019-10-03 DIAGNOSIS — R0600 Dyspnea, unspecified: Secondary | ICD-10-CM

## 2019-10-03 DIAGNOSIS — Z8616 Personal history of COVID-19: Secondary | ICD-10-CM | POA: Diagnosis not present

## 2019-10-03 NOTE — Patient Instructions (Addendum)
Medication Instructions:  No Changes In Medications at this time.  *If you need a refill on your cardiac medications before your next appointment, please call your pharmacy*  Lab Work: None Ordered At This Time.  If you have labs (blood work) drawn today and your tests are completely normal, you will receive your results only by: . MyChart Message (if you have MyChart) OR . A paper copy in the mail If you have any lab test that is abnormal or we need to change your treatment, we will call you to review the results.  Testing/Procedures: None Ordered At This Time.   Follow-Up: At [facility], you and your health needs are our priority.  As part of our continuing mission to provide you with exceptional heart care, we have created designated Provider Care Teams.  These Care Teams include your primary Cardiologist (physician) and Advanced Practice Providers (APPs -  Physician Assistants and Nurse Practitioners) who all work together to provide you with the care you need, when you need it.  Your next appointment:   3 month(s)  The format for your next appointment:   In Person  Provider:   Chryl Holten, MD  

## 2019-10-03 NOTE — Progress Notes (Signed)
Virtual Visit via Telephone Note   This visit type was conducted due to national recommendations for restrictions regarding the COVID-19 Pandemic (e.g. social distancing) in an effort to limit this patient's exposure and mitigate transmission in our community.  Due to her co-morbid illnesses, this patient is at least at moderate risk for complications without adequate follow up.  This format is felt to be most appropriate for this patient at this time.  The patient did not have access to video technology/had technical difficulties with video requiring transitioning to audio format only (telephone).  All issues noted in this document were discussed and addressed.  No physical exam could be performed with this format.  Please refer to the patient's chart for her  consent to telehealth for [facility].    Date:  10/03/2019   ID:  Karina [name], DOB [birth year], MRN [id number] The patient was identified using 2 identifiers.  Patient Location: Home Provider Location: Home Office  PCP:  Wilber Oliphant, MD  Cardiologist:  No primary care provider on file.  Electrophysiologist:  None   Evaluation Performed:  Follow-Up Visit  Chief Complaint:  Chest pain  History of Present Illness:    Karina [name] is a 34 y.o. female with DM2 with neuropathy and retinopathy, DOE after COVID 19 infection, HTN, LE edema, obesity, syncope and tachycardia. She presents for evaluation of chest pain and DOE.     Her stress test showed no ischemia or infarction.  Her eye surgery is scheduled for Monday.  She has had no lightheadedness.  Overall her symptoms are improving and her chest pain continues to improve on Protonix.  The patient does not have symptoms concerning for COVID-19 infection (fever, chills, cough, or new shortness of breath).    Past Medical History:  Diagnosis Date   Acute pain of right shoulder 03/13/2019   Back pain 12/12/2017   Carpal tunnel syndrome of right wrist 03/13/2019    Depression 12/27/2012   Diabetes mellitus    Type 2, insulin resistant   Diabetic neuropathy, type II diabetes mellitus ([place]) 05/23/2013   DOE (dyspnea on exertion) 05/21/2019   Onset with covid 19 infection symptoms started 01/16/19  -  05/23/2019   Walked RA  3 laps @ approx 212f each @ moderat pace  stopped due to end of study,  no limiting sob/ sats 100% at end despite reported sob at rest    Essential hypertension, benign 12/12/2013   History of COVID-19 03/22/2019   Lower extremity edema 08/29/2017   Moderate nonproliferative diabetic retinopathy ([place] 05/24/2013   Both OS and OD.  Diagnosed by Dr. SLoyal Buba MD, PhD 05/23/13. Also mild macular edema. Follows with Dr. 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 Please call the office if you have any issues obtaining this monitor.   furosemide (LASIX) 20 MG tablet Take 1 tablet (20 mg total) by mouth daily.   Insulin Pen Needle (B-D ULTRAFINE III SHORT PEN) 31G X 8 MM MISC 1 Container by Does not apply route as needed.   losartan (COZAAR) 100 MG tablet Take 100 mg by mouth daily.    pantoprazole (PROTONIX) 40 MG tablet Take 30- 60 min before your first and last meals of the day   Vitamin D, Ergocalciferol, (DRISDOL) 1.25 MG (50000 UNIT) CAPS capsule Take 1 capsule (50,000 Units total) by mouth every 7 (seven) days.     Allergies:   Lisinopril, Eggs or egg-derived products, and Contrast media [iodinated diagnostic agents]   Social History   Tobacco Use   Smoking status: Never Smoker   Smokeless tobacco: Never Used  Vaping Use   Vaping Use: Never used  Substance Use Topics   Alcohol use: No   Drug use: No     Family Hx: The patient's family history includes Cancer in her mother; Diabetes in her father and mother.  ROS:   Please see the history of present illness.     All other systems reviewed and are negative.   Prior CV studies:   The following studies were reviewed today:    Labs/Other Tests and Data Reviewed:    EKG:  No ECG reviewed.  Recent Labs: 05/24/2019: Hemoglobin 10.8; Platelets 415.0; Pro B Natriuretic peptide (BNP) 27.0; TSH 2.46 08/31/2019: BUN 20; Creatinine, Ser 1.35; Potassium 4.3; Sodium 136   Recent Lipid Panel Lab Results  Component Value Date/Time   CHOL 208 (H) 09/14/2018 03:02 PM   TRIG 190 (H) 09/14/2018 03:02 PM   HDL 52 09/14/2018 03:02 PM   CHOLHDL 4.0 09/14/2018 03:02 PM   CHOLHDL 3.4 03/21/2015 12:30 PM   LDLCALC 123 (H) 09/14/2018 03:02 PM    Wt Readings from Last 3 Encounters:  10/03/19 263 lb (119.3 kg)  10/02/19 263 lb (119.3 kg)  09/24/19 258 lb (117 kg)     Objective:    Vital Signs:  Ht 5' 2" (1.575 m)    Wt 263 lb (119.3 kg)    LMP 09/07/2019 (Exact Date)    BMI 48.10 kg/m    VITAL SIGNS:  reviewed GEN:  no acute distress RESPIRATORY:  normal respiratory effort, no increased work of breathing NEURO:  alert and oriented x 3, speech normal PSYCH:  normal affect   ASSESSMENT & PLAN:    Precordial pain  DOE (dyspnea on exertion)  History of COVID-19  Preoperative cardiovascular  examination  The patient is low-intermediate risk for intermediate risk procedure due to need for general anesthesia.  No further cardiovascular testing is required prior to the procedure.  If this level of risk is acceptable to the patient and surgical team, the patient should be considered optimized from a cardiovascular standpoint. This has been discussed in detail with patient. She understands risk level and is willing to proceed.  Chest pain is likely extracardiac and improving with PPI.   DOE improving. Continue lasix 20 mg if helpful for swelling and DOE. Can take PRN otherwise.   Continue losartan for HTN at current dose.   COVID-19 Education: The signs and symptoms of COVID-19 were discussed with the patient and how to seek care for testing (follow  up with PCP or arrange E-visit).  The importance of social distancing was discussed today.  Time:   Today, I have spent 10 minutes with the patient with telehealth technology discussing the above problems.     Medication Adjustments/Labs and Tests Ordered: Current medicines are reviewed at length with the patient today.  Concerns regarding medicines are outlined above.   Patient Instructions  Medication Instructions:  No Changes In Medications at this time.  *If you need a refill on your cardiac medications before your next appointment, please call your pharmacy*  Lab Work: None Ordered At This Time.  If you have labs (blood work) drawn today and your tests are completely normal, you will receive your results only by:  [place] (if you have MyChart) OR  A paper copy in the mail If you have any lab test that is abnormal or we need to change your treatment, we will call you to review the results.  Testing/Procedures: None Ordered At This Time.   Follow-Up: At [facility], you and your health needs are our priority.  As part of our continuing mission to provide you with exceptional heart care, we have created  designated Provider Care Teams.  These Care Teams include your primary Cardiologist (physician) and Advanced Practice Providers (APPs -  Physician Assistants and Nurse Practitioners) who all work together to provide you with the care you need, when you need it.  Your next appointment:   3 month(s)  The format for your next appointment:   In Person  Provider:   Cherlynn Kaiser, MD    Signed, Elouise Munroe, MD  10/03/2019 9:01 AM    Progress

## 2019-10-07 ENCOUNTER — Encounter: Payer: Self-pay | Admitting: Family Medicine

## 2019-10-07 NOTE — Assessment & Plan Note (Signed)
A1C today is10.8. Given patient's history of non-compliance, focus today will be on simple intervention, SMART Goals and close follow up with patient.  Patient ultimate goal to get to insulin pump; however, she has a long way to go before she is a candidate for this. Therefore, strict CBG monitoring while we titrate her insulins will be important.   Patient would like to discuss CGM at her next visit to limit needle sticks (as this has kept her from compliance in the past).   Goals:  1. Compliance:  Take humalog with every meal. Make meals at a time that works for her. No sliding scale at this time.  2. Fasting CBGs to titrate tresiba. Titrate 2 units daily every day her fast CBG is > 200.   Follow up in two weeks   - Reveiwed diet with patient - she would benefit from DM education as her last was several years ago.  - No refills needed today  - f/u in two weeks

## 2019-10-08 DIAGNOSIS — E113553 Type 2 diabetes mellitus with stable proliferative diabetic retinopathy, bilateral: Secondary | ICD-10-CM | POA: Diagnosis not present

## 2019-10-08 DIAGNOSIS — E113591 Type 2 diabetes mellitus with proliferative diabetic retinopathy without macular edema, right eye: Secondary | ICD-10-CM | POA: Diagnosis not present

## 2019-10-08 DIAGNOSIS — E113542 Type 2 diabetes mellitus with proliferative diabetic retinopathy with combined traction retinal detachment and rhegmatogenous retinal detachment, left eye: Secondary | ICD-10-CM | POA: Diagnosis not present

## 2019-10-08 DIAGNOSIS — H43812 Vitreous degeneration, left eye: Secondary | ICD-10-CM | POA: Diagnosis not present

## 2019-10-08 DIAGNOSIS — H35342 Macular cyst, hole, or pseudohole, left eye: Secondary | ICD-10-CM | POA: Diagnosis not present

## 2019-10-08 HISTORY — PX: RETINAL DETACHMENT REPAIR W/ SCLERAL BUCKLE LE: SHX2338

## 2019-10-10 ENCOUNTER — Ambulatory Visit (INDEPENDENT_AMBULATORY_CARE_PROVIDER_SITE_OTHER)
Admission: EM | Admit: 2019-10-10 | Discharge: 2019-10-10 | Disposition: A | Discharge status: Home / Self Care | Payer: BC Managed Care – PPO | Source: Home / Self Care

## 2019-10-10 ENCOUNTER — Other Ambulatory Visit: Payer: Self-pay

## 2019-10-10 ENCOUNTER — Telehealth: Payer: Self-pay | Admitting: Family Medicine

## 2019-10-10 ENCOUNTER — Emergency Department (HOSPITAL_COMMUNITY): Payer: BC Managed Care – PPO

## 2019-10-10 ENCOUNTER — Emergency Department (HOSPITAL_COMMUNITY)
Admission: EM | Admit: 2019-10-10 | Discharge: 2019-10-11 | Disposition: A | Discharge status: Home / Self Care | Payer: BC Managed Care – PPO | Attending: Emergency Medicine | Admitting: Emergency Medicine

## 2019-10-10 ENCOUNTER — Encounter (HOSPITAL_COMMUNITY): Payer: Self-pay | Admitting: Emergency Medicine

## 2019-10-10 DIAGNOSIS — R0789 Other chest pain: Secondary | ICD-10-CM | POA: Insufficient documentation

## 2019-10-10 DIAGNOSIS — Z8616 Personal history of COVID-19: Secondary | ICD-10-CM | POA: Insufficient documentation

## 2019-10-10 DIAGNOSIS — N182 Chronic kidney disease, stage 2 (mild): Secondary | ICD-10-CM | POA: Insufficient documentation

## 2019-10-10 DIAGNOSIS — E1121 Type 2 diabetes mellitus with diabetic nephropathy: Secondary | ICD-10-CM | POA: Insufficient documentation

## 2019-10-10 DIAGNOSIS — E1122 Type 2 diabetes mellitus with diabetic chronic kidney disease: Secondary | ICD-10-CM | POA: Diagnosis not present

## 2019-10-10 DIAGNOSIS — Z209 Contact with and (suspected) exposure to unspecified communicable disease: Secondary | ICD-10-CM | POA: Diagnosis not present

## 2019-10-10 DIAGNOSIS — R079 Chest pain, unspecified: Secondary | ICD-10-CM | POA: Diagnosis not present

## 2019-10-10 DIAGNOSIS — E1140 Type 2 diabetes mellitus with diabetic neuropathy, unspecified: Secondary | ICD-10-CM | POA: Diagnosis not present

## 2019-10-10 DIAGNOSIS — R0682 Tachypnea, not elsewhere classified: Secondary | ICD-10-CM | POA: Insufficient documentation

## 2019-10-10 DIAGNOSIS — R2243 Localized swelling, mass and lump, lower limb, bilateral: Secondary | ICD-10-CM | POA: Insufficient documentation

## 2019-10-10 DIAGNOSIS — R0600 Dyspnea, unspecified: Secondary | ICD-10-CM | POA: Diagnosis not present

## 2019-10-10 DIAGNOSIS — E11319 Type 2 diabetes mellitus with unspecified diabetic retinopathy without macular edema: Secondary | ICD-10-CM | POA: Insufficient documentation

## 2019-10-10 DIAGNOSIS — Z79899 Other long term (current) drug therapy: Secondary | ICD-10-CM | POA: Diagnosis not present

## 2019-10-10 DIAGNOSIS — I161 Hypertensive emergency: Secondary | ICD-10-CM | POA: Insufficient documentation

## 2019-10-10 DIAGNOSIS — I129 Hypertensive chronic kidney disease with stage 1 through stage 4 chronic kidney disease, or unspecified chronic kidney disease: Secondary | ICD-10-CM | POA: Insufficient documentation

## 2019-10-10 DIAGNOSIS — R61 Generalized hyperhidrosis: Secondary | ICD-10-CM | POA: Diagnosis not present

## 2019-10-10 DIAGNOSIS — M5489 Other dorsalgia: Secondary | ICD-10-CM | POA: Diagnosis not present

## 2019-10-10 DIAGNOSIS — Z794 Long term (current) use of insulin: Secondary | ICD-10-CM | POA: Diagnosis not present

## 2019-10-10 DIAGNOSIS — R0602 Shortness of breath: Secondary | ICD-10-CM | POA: Diagnosis not present

## 2019-10-10 LAB — CBC
HCT: 30.8 % — ABNORMAL LOW (ref 36.0–46.0)
Hemoglobin: 9.7 g/dL — ABNORMAL LOW (ref 12.0–15.0)
MCH: 26.4 pg (ref 26.0–34.0)
MCHC: 31.5 g/dL (ref 30.0–36.0)
MCV: 83.7 fL (ref 80.0–100.0)
Platelets: 448 10*3/uL — ABNORMAL HIGH (ref 150–400)
RBC: 3.68 MIL/uL — ABNORMAL LOW (ref 3.87–5.11)
RDW: 13.2 % (ref 11.5–15.5)
WBC: 6.7 10*3/uL (ref 4.0–10.5)
nRBC: 0 % (ref 0.0–0.2)

## 2019-10-10 LAB — I-STAT BETA HCG BLOOD, ED (MC, WL, AP ONLY): I-stat hCG, quantitative: <5 m[IU]/mL (ref <5)

## 2019-10-10 LAB — BASIC METABOLIC PANEL
Anion gap: 9 (ref 5–15)
BUN: 16 mg/dL (ref 6–20)
CO2: 27 mmol/L (ref 22–32)
Calcium: 8.9 mg/dL (ref 8.9–10.3)
Chloride: 103 mmol/L (ref 98–111)
Creatinine, Ser: 1.59 mg/dL — ABNORMAL HIGH (ref 0.44–1.00)
GFR calc Af Amer: 48 mL/min — ABNORMAL LOW (ref >60)
GFR calc non Af Amer: 41 mL/min — ABNORMAL LOW (ref >60)
Glucose, Bld: 150 mg/dL — ABNORMAL HIGH (ref 70–99)
Potassium: 4.1 mmol/L (ref 3.5–5.1)
Sodium: 139 mmol/L (ref 135–145)

## 2019-10-10 LAB — TROPONIN I (HIGH SENSITIVITY): Troponin I (High Sensitivity): 6 ng/L (ref <18)

## 2019-10-10 NOTE — ED Triage Notes (Signed)
Pt states she had eye surgery on Monday and developed chest pains beginning this morning. Pt is also complaining of being short of breath and fatigued. Pt is aox4 and ambulates with some difficulty.

## 2019-10-10 NOTE — Telephone Encounter (Signed)
Received a second call the after-hours emergency call line.  When I returned the patient's call she was at the urgent care being evaluated for her chest pain.  She reports that she did not go to the emergency room this morning after I instructed her to because her daughter had school and she had other things she had to deal with today.  She reports the pain is still significant but has no need for assistance at this time because she is at the urgent care.

## 2019-10-10 NOTE — ED Provider Notes (Signed)
[facility]    CSN: [id number] Arrival date & time: 10/10/19  1905      History   Chief Complaint Chief Complaint  Patient presents with  . Chest Pain    since today    HPI Karina [name] is a 34 y.o. female  With extensive medical history as outlined below presenting for central chest pain that began abruptly at 2 AM this morning.  Denies nausea or vomiting, palpitations.  States that she feels like she has increased dyspnea from baseline.  No recent illness, fever, change in medications.  Has had elevated blood pressure readings.  Called her PCP who recommended she go to ER for further evaluation.  Presented here as she thought she would have a shorter wait time.  Past Medical History:  Diagnosis Date  . Acute pain of right shoulder 03/13/2019  . Back pain 12/12/2017  . Carpal tunnel syndrome of right wrist 03/13/2019  . COVID-19 01/24/2019  . Depression 12/27/2012  . Diabetes mellitus    Type 2, insulin resistant  . Diabetic neuropathy, type II diabetes mellitus ([place]) 05/23/2013  . DOE (dyspnea on exertion) 05/21/2019   Onset with covid 19 infection symptoms started 01/16/19  -  05/23/2019   Walked RA  3 laps @ approx 26ft each @ moderat pace  stopped due to end of study,  no limiting sob/ sats 100% at end despite reported sob at rest   . Essential hypertension, benign 12/12/2013  . History of COVID-19 03/22/2019  . Lower extremity edema 08/29/2017  . Moderate nonproliferative diabetic retinopathy ([place]) 05/24/2013   Both OS and OD.  Diagnosed by Dr. Loyal Buba, MD, PhD 05/23/13. Also mild macular edema. Follows with Dr. Katy Fitch  . Morbid obesity ([place]) 05/25/2013  . Nephrotic syndrome due to diabetes mellitus ([place]) 04/10/2019  . 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 Shortness of breath 03/22/2019  . Syncope 04/08/2015  . Tachycardia 05/21/2019  . 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 Upper airway cough syndrome 03/22/2019    Patient Active Problem List   Diagnosis Date Noted  . Vitamin D deficiency 09/04/2019  . Stable proliferative diabetic retinopathy of both eyes associated with type 2 diabetes mellitus ([place]) 08/17/2019  . Current mild episode of major depressive disorder ([place]) 08/08/2019  . Sleep-related headache 07/05/2019  . 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 Cephalalgia 07/05/2019  . Proliferative diabetic retinopathy of right eye ([place]) 06/20/2019  . Proliferative diabetic retinopathy of left eye ([place]) 06/20/2019  . Vitreous hemorrhage of left eye ([place]) 06/20/2019  . DOE (dyspnea on exertion) 05/21/2019  . Tachycardia 05/21/2019  . History of COVID-19 03/22/2019  . Upper airway cough syndrome 03/22/2019  . Shortness of breath 03/22/2019  . Carpal tunnel syndrome of right wrist 03/13/2019  . Gastroesophageal reflux disease 03/23/2018  . Hoarse 03/23/2018  . Referred ear pain, bilateral 03/23/2018  . Hypertension associated with stage 2 chronic kidney disease due to type 2 diabetes mellitus ([place]) 12/12/2013  . Morbid obesity ([place]) 05/25/2013  . Type 2 diabetes mellitus with both eyes affected by moderate nonproliferative retinopathy and macular edema, with long-term current use of insulin ([place]) 12/27/2012  . Depression 12/27/2012    Past Surgical History:  Procedure Laterality Date  . CERVICAL BIOPSY  2004  . CESAREAN SECTION N/A 06/02/2012   Procedure: CESAREAN SECTION;  Surgeon: Mora Bellman, MD;  Location: [facility];  Service: Obstetrics;  Laterality: N/A;    OB History    Gravida  1   Para  1   Term  1   Preterm      AB      Living  1     SAB      TAB      Ectopic      Multiple      Live Births  1             Home Medications    Prior to Admission medications   Medication Sig Start Date End Date Taking? Authorizing Provider  benzonatate (TESSALON) 200 MG capsule Take three times a day until no longer coughing, then take as needed 05/23/19   Tanda Rockers, MD  Blood Pressure Monitoring (BLOOD PRESSURE MONITOR AUTOMAT) DEVI 1 kit by Does not apply route daily. Please call the office if you have any issues obtaining this monitor. 08/08/19   Wilber Oliphant, MD  furosemide (LASIX) 20 MG tablet Take 1 tablet (20 mg total) by mouth daily. 09/06/19 12/05/19  Elouise Munroe, MD  insulin lispro (HUMALOG KWIKPEN) 200 UNIT/ML KwikPen Inject 20-28 Units into the skin 3 (three) times daily. 08/31/19 09/30/19  Wilber Oliphant, MD  Insulin Pen Needle (B-D ULTRAFINE III SHORT PEN) 31G X 8 MM MISC 1 Container by Does not apply route as needed. 08/08/19   Wilber Oliphant, MD  losartan (COZAAR) 100 MG tablet Take 100 mg by mouth daily. 05/04/19   [provider]  pantoprazole (PROTONIX) 40 MG tablet Take 30- 60 min before your first and last meals of the day 07/02/19   Tanda Rockers, MD  Vitamin D, Ergocalciferol, (DRISDOL) 1.25 MG (50000 UNIT) CAPS capsule Take 1 capsule (50,000 Units total) by mouth every 7 (seven) days. 09/06/19   Wilber Oliphant, MD  gabapentin (NEURONTIN) 100 MG capsule Take 1 capsule (100 mg total) by mouth at bedtime. 08/25/18 01/31/19  Kathrene Alu, MD    Family History Family History  Problem Relation Age of Onset  . Diabetes Father   . Cancer Mother   . Diabetes Mother     Social History Social History   Tobacco Use  . Smoking status: Never Smoker  . Smokeless tobacco: Never Used  Vaping Use  . Vaping Use: Never used  Substance Use Topics  . Alcohol use: No  . Drug use: No     Allergies   Lisinopril, Eggs or egg-derived products, and Contrast media [iodinated diagnostic agents]   Review of Systems As per HPI    Physical Exam Triage Vital Signs ED  Triage Vitals  Enc Vitals Group     BP      Pulse      Resp      Temp      Temp src      SpO2      Weight      Height      Head Circumference      Peak Flow      Pain Score      Pain Loc      Pain Edu?      Excl. in [place]?    No data found.  Updated Vital Signs BP (!) 167/112 (BP Location: Left Arm)   Pulse  99   Temp 98.2 F (36.8 C) (Oral)   Resp (!) 22   SpO2 97%   Visual Acuity Right Eye Distance:   Left Eye Distance:   Bilateral Distance:    Right Eye Near:   Left Eye Near:    Bilateral Near:     Physical Exam Constitutional:      General: She is not in acute distress. HENT:     Head: Normocephalic and atraumatic.  Eyes:     General: No scleral icterus.    Pupils: Pupils are equal, round, and reactive to light.  Cardiovascular:     Rate and Rhythm: Normal rate and regular rhythm.     Heart sounds: Normal heart sounds.  Pulmonary:     Effort: Pulmonary effort is normal. Tachypnea present.     Breath sounds: Normal breath sounds.     Comments: Labored when talking Skin:    Coloration: Skin is not jaundiced or pale.  Neurological:     Mental Status: She is alert and oriented to person, place, and time.      UC Treatments / Results  Labs (all labs ordered are listed, but only abnormal results are displayed) Labs Reviewed - No data to display  EKG   Radiology   Procedures Procedures (including critical care time)  Medications Ordered in UC Medications - No data to display  Initial Impression / Assessment and Plan / UC Course  I have reviewed the triage vital signs and the nursing notes.  Pertinent labs & imaging results that were available during my care of the patient were reviewed by me and considered in my medical decision making (see chart for details).     Patient hypertensive, initially tachypnea, though this resolved with rest.  EKG done office, reviewed by me compared to previous from 06/25/2019.  NSR with possible LVH.   Ventricular rate 94 bpm.  No QTC prolongation.  Patient does have new onset biphasic T waves in inferior leads.  Given patient's comorbidities (coronary CT pending, ordered by cardiologist), concern for post acute inferior MI.  Referred to ER for further evaluation/management of chest pain.  Transported in stable condition via EMS. Final Clinical Impressions(s) / UC Diagnoses   Final diagnoses:  Chest pain, unspecified type  Tachypnea  Hypertensive emergency   Discharge Instructions   None    ED Prescriptions    None     PDMP not reviewed this encounter.   [place], [place], [place] 10/11/19 1043

## 2019-10-10 NOTE — ED Triage Notes (Signed)
Pt presents to ED BIB [facility]. Pt c/o L CP that began at 0200. Pt reports that she had bilateral eye surgery on mon then began having cough and SOB.   EMS given  324 ASA Nitro x2

## 2019-10-10 NOTE — Telephone Encounter (Signed)
Patient called the after-hours emergency line due to sudden onset chest pain.  She reports she had a procedure done on Monday and that this morning she woke up to a 8 out of 10 pain on the left side of her chest.  The pain has been present for several hours and she thought that it would resolve but has not.  She started taking Tylenol yesterday for other pains but it has not helped with this new chest pain.  She reports the pain is a mixture of sharp pain and pressure in her chest.  The pain does not radiate to her back or down her arm.  The pain is worse with breathing in but not reproducible with palpation.  Discussed with the patient the possible causes of this chest pain.  Given the sudden onset nature of the pain as well as the pleuritic nature of her pain and recent procedure I recommended she take an aspirin and be further evaluated in the emergency department.  Patient is agreeable and reports she will go right now.

## 2019-10-11 ENCOUNTER — Emergency Department (HOSPITAL_COMMUNITY): Payer: BC Managed Care – PPO

## 2019-10-11 DIAGNOSIS — R079 Chest pain, unspecified: Secondary | ICD-10-CM | POA: Diagnosis not present

## 2019-10-11 LAB — CBG MONITORING, ED
Glucose-Capillary: 137 mg/dL — ABNORMAL HIGH (ref 70–99)
Glucose-Capillary: 130 mg/dL — ABNORMAL HIGH (ref 70–99)

## 2019-10-11 LAB — BRAIN NATRIURETIC PEPTIDE: B Natriuretic Peptide: 26.8 pg/mL (ref 0.0–100.0)

## 2019-10-11 LAB — D-DIMER, QUANTITATIVE: D-Dimer, Quant: 1.65 ug/mL-FEU — ABNORMAL HIGH (ref 0.00–0.50)

## 2019-10-11 LAB — TROPONIN I (HIGH SENSITIVITY): Troponin I (High Sensitivity): 4 ng/L (ref <18)

## 2019-10-11 MED ORDER — IOHEXOL 350 MG/ML SOLN
50.0000 mL | Freq: Once | INTRAVENOUS | Status: AC | PRN
  Administered 2019-10-11: 50 mL via INTRAVENOUS

## 2019-10-11 MED ORDER — FAMOTIDINE 20 MG PO TABS: 20.0000 mg | ORAL_TABLET | Freq: Two times a day (BID) | ORAL | 0 refills | Status: DC

## 2019-10-11 MED ORDER — ONDANSETRON HCL 4 MG/2ML IJ SOLN
4.0000 mg | Freq: Once | INTRAMUSCULAR | Status: AC
  Administered 2019-10-11: 4 mg via INTRAVENOUS
  Filled 2019-10-11: qty 2

## 2019-10-11 MED ORDER — OXYCODONE-ACETAMINOPHEN 5-325 MG PO TABS
1.0000 | ORAL_TABLET | ORAL | Status: DC | PRN
  Administered 2019-10-11: 1 via ORAL
  Filled 2019-10-11: qty 1

## 2019-10-11 MED ORDER — ACETAMINOPHEN 325 MG PO TABS
650.0000 mg | ORAL_TABLET | Freq: Once | ORAL | Status: AC
  Administered 2019-10-11: 650 mg via ORAL
  Filled 2019-10-11: qty 2

## 2019-10-11 NOTE — Discharge Instructions (Addendum)
Please follow up with your primary care provider or with your cardiologist within 3-5 days for re-evaluation of your symptoms.   Please return to the emergency department for any new or worsening symptoms.

## 2019-10-11 NOTE — ED Provider Notes (Signed)
[facility] EMERGENCY DEPARTMENT Provider Note   CSN: [id number] Arrival date & time: 10/10/19  2115     History Chief Complaint  Patient presents with  . Chest Pain    Karina [name] is a 34 y.o. female.  HPI   Pt is a 34 y/o female with a h/o covid, depression, DM, diabetic neuropathy, HTN, diabetic retinopathy, nephrotic syndrome,  who presents to the ED today for eval of chest pain that started yesterday at 200AM. Pain located to the left side of the chest initially but is now involving the right side of the chest. States the left side of her back was also hurting. Describes pain as "between dull and sharp". Rates pain 8/10. Pain has gradually improved since onset. Reports associated dyspnea and diaphoresis. Denies pleuritic pain, nausea, vomiting. Has had some increase in her BLE swelling over the last 4 days. She is on lasix, she missed her lasix twice this week.   Has had COVID earlier this year. She also received her covid vaccines following this.   States father passed of an MI at 55. Denies other known early fam hx of heart disease. Denies tobacco use.    Past Medical History:  Diagnosis Date  . Acute pain of right shoulder 03/13/2019  . Back pain 12/12/2017  . Carpal tunnel syndrome of right wrist 03/13/2019  . COVID-19 01/24/2019  . Depression 12/27/2012  . Diabetes mellitus    Type 2, insulin resistant  . Diabetic neuropathy, type II diabetes mellitus ([place]) 05/23/2013  . DOE (dyspnea on exertion) 05/21/2019   Onset with covid 19 infection symptoms started 01/16/19  -  05/23/2019   Walked RA  3 laps @ approx 259ft each @ moderat pace  stopped due to end of study,  no limiting sob/ sats 100% at end despite reported sob at rest   . Essential hypertension, benign 12/12/2013  . History of COVID-19 03/22/2019  . Lower extremity edema 08/29/2017  . Moderate nonproliferative diabetic retinopathy ([place]) 05/24/2013   Both OS and OD.  Diagnosed by Dr. Loyal Buba, MD,  PhD 05/23/13. Also mild macular edema. Follows with Dr. Katy Fitch  . Morbid obesity ([place]) 05/25/2013  . Nephrotic syndrome due to diabetes mellitus ([place]) 04/10/2019  . 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 Shortness of breath 03/22/2019  . Syncope 04/08/2015  . Tachycardia 05/21/2019  . 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 Upper airway cough syndrome 03/22/2019    Patient Active Problem List   Diagnosis Date Noted  . Vitamin D deficiency 09/04/2019  . Stable proliferative diabetic retinopathy of both eyes associated with type 2 diabetes mellitus ([place]) 08/17/2019  . Current mild episode of major depressive disorder ([place]) 08/08/2019  . Sleep-related headache 07/05/2019  . 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, bilateral ([place]) 07/05/2019  . Cephalalgia 07/05/2019  . Proliferative diabetic retinopathy of right eye ([place]) 06/20/2019  . Proliferative diabetic retinopathy of left eye ([place]) 06/20/2019  . Vitreous hemorrhage of left eye ([place]) 06/20/2019  . DOE (dyspnea on exertion) 05/21/2019  . Tachycardia 05/21/2019  . History of COVID-19 03/22/2019  . Upper airway cough syndrome 03/22/2019  . Shortness of breath 03/22/2019  . Carpal tunnel syndrome of right wrist 03/13/2019  . Gastroesophageal  reflux disease 03/23/2018  . Hoarse 03/23/2018  . Referred ear pain, bilateral 03/23/2018  . Hypertension associated with stage 2 chronic kidney disease due to type 2 diabetes mellitus (HCC) 12/12/2013  . Morbid obesity (HCC) 05/25/2013  . 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 Depression  12/27/2012    Past Surgical History:  Procedure Laterality Date  . CERVICAL BIOPSY  2004  . CESAREAN SECTION N/A 06/02/2012   Procedure: CESAREAN SECTION;  Surgeon: Catalina Antigua, MD;  Location: [facility];  Service: Obstetrics;  Laterality: N/A;     OB History    Gravida  1   Para  1   Term  1   Preterm      AB      Living  1     SAB      TAB      Ectopic      Multiple      Live Births  1           Family History  Problem Relation Age of Onset  . Diabetes Father   . Cancer Mother   . Diabetes Mother     Social History   Tobacco Use  . Smoking status: Never Smoker  . Smokeless tobacco: Never Used  Vaping Use  . Vaping Use: Never used  Substance Use Topics  . Alcohol use: No  . Drug use: No    Home Medications Prior to Admission medications   Medication Sig Start Date End Date Taking? Authorizing Provider  albuterol (VENTOLIN HFA) 108 (90 Base) MCG/ACT inhaler Inhale 1-2 puffs into the lungs every 4 (four) hours as needed for shortness of breath.   Yes [provider]  ALPHAGAN P 0.1 % SOLN Place 1 drop into both eyes 2 (two) times daily. 10/09/19  Yes [provider]  benzonatate (TESSALON) 200 MG capsule Take three times a day until no longer coughing, then take as needed 05/23/19  Yes Nyoka Cowden, MD  dorzolamide-timolol (COSOPT) 22.3-6.8 MG/ML ophthalmic solution Place 1 drop into both eyes 2 (two) times daily. 10/09/19  Yes [provider]  furosemide (LASIX) 20 MG tablet Take 1 tablet (20 mg total) by mouth daily. 09/06/19 12/05/19 Yes Parke Poisson, MD  Insulin Degludec (TRESIBA) 100 UNIT/ML SOLN Inject 30 Units into the skin 2 (two) times daily.   Yes [provider]  insulin lispro (HUMALOG KWIKPEN) 200 UNIT/ML KwikPen Inject 20-28 Units into the skin 3 (three) times daily. Patient taking differently: Inject 16-20 Units into the skin 3 (three) times daily.  08/31/19 10/11/19 Yes Melene Plan, MD  losartan  (COZAAR) 100 MG tablet Take 100 mg by mouth daily. 05/04/19  Yes [provider]  moxifloxacin (VIGAMOX) 0.5 % ophthalmic solution Place 1 drop into both eyes 3 (three) times daily.   Yes [provider]  pantoprazole (PROTONIX) 40 MG tablet Take 30- 60 min before your first and last meals of the day Patient taking differently: Take 40 mg by mouth daily.  07/02/19  Yes Nyoka Cowden, MD  Vitamin D, Ergocalciferol, (DRISDOL) 1.25 MG (50000 UNIT) CAPS capsule Take 1 capsule (50,000 Units total) by mouth every 7 (seven) days. Patient taking differently: Take 50,000 Units by mouth every Friday.  09/06/19  Yes Melene Plan, MD  Blood Pressure Monitoring (BLOOD PRESSURE MONITOR AUTOMAT) DEVI 1 kit by Does not apply route daily.  Please call the office if you have any issues obtaining this monitor. 08/08/19   Wilber Oliphant, MD  Insulin Pen Needle (B-D ULTRAFINE III SHORT PEN) 31G X 8 MM MISC 1 Container by Does not apply route as needed. 08/08/19   Wilber Oliphant, MD  gabapentin (NEURONTIN) 100 MG capsule Take 1 capsule (100 mg total) by mouth at bedtime. 08/25/18 01/31/19  Kathrene Alu, MD    Allergies    Lisinopril, Eggs or egg-derived products, and Contrast media [iodinated diagnostic agents]  Review of Systems   Review of Systems  Constitutional: Positive for diaphoresis. Negative for fever.  HENT: Negative for ear pain and sore throat.   Eyes: Negative for visual disturbance.  Respiratory: Positive for shortness of breath. Negative for cough.   Cardiovascular: Positive for chest pain and leg swelling.  Gastrointestinal: Negative for abdominal pain, constipation, diarrhea, nausea and vomiting.  Genitourinary: Negative for dysuria and hematuria.  Musculoskeletal: Negative for back pain.  Skin: Negative for rash.  Neurological: Negative for headaches.  All other systems reviewed and are negative.   Physical Exam Updated Vital Signs BP (!) 174/108   Pulse 98   Temp 98.2 F  (36.8 C) (Oral)   Resp 18   Ht _0  (1.575 m)   SpO2 100%   BMI 48.10 kg/m   Physical Exam Vitals and nursing note reviewed.  Constitutional:      General: She is not in acute distress.    Appearance: She is well-developed.  HENT:     Head: Normocephalic and atraumatic.  Eyes:     Conjunctiva/sclera: Conjunctivae normal.  Cardiovascular:     Rate and Rhythm: Normal rate and regular rhythm.     Heart sounds: No murmur heard.   Pulmonary:     Effort: Pulmonary effort is normal. No respiratory distress.     Breath sounds: Normal breath sounds. No decreased breath sounds, wheezing, rhonchi or rales.  Chest:     Chest wall: No tenderness.  Abdominal:     Palpations: Abdomen is soft.     Tenderness: There is no abdominal tenderness. There is no guarding or rebound.  Musculoskeletal:     Cervical back: Neck supple.     Right lower leg: No tenderness. Edema present.     Left lower leg: No tenderness. Edema present.  Skin:    General: Skin is warm and dry.  Neurological:     Mental Status: She is alert.     ED Results / Procedures / Treatments   Labs (all labs ordered are listed, but only abnormal results are displayed) Labs Reviewed  BASIC METABOLIC PANEL - Abnormal; Notable for the following components:      Result Value   Glucose, Bld 150 (*)    Creatinine, Ser 1.59 (*)    GFR calc non Af Amer 41 (*)    GFR calc Af Amer 48 (*)    All other components within normal limits  CBC - Abnormal; Notable for the following components:   RBC 3.68 (*)    Hemoglobin 9.7 (*)    HCT 30.8 (*)    Platelets 448 (*)    All other components within normal limits  D-DIMER, QUANTITATIVE (NOT AT [facility]) - Abnormal; Notable for the following components:   D-Dimer, Quant 1.65 (*)    All other components within normal limits  CBG MONITORING, ED - Abnormal; Notable for the following components:   Glucose-Capillary 137 (*)    All other components within normal  limits  CBG MONITORING, ED -  Abnormal; Notable for the following components:   Glucose-Capillary 130 (*)    All other components within normal limits  BRAIN NATRIURETIC PEPTIDE  I-STAT BETA HCG BLOOD, ED ([facility], [facility], [facility] ONLY)  TROPONIN I (HIGH SENSITIVITY)  TROPONIN I (HIGH SENSITIVITY)    EKG None   EKG with normal sinus rhythm, minimal voltage criteria for LVH, age-indeterminate anterior infarct.  No evidence of STEMI.  Radiology DG Chest 2 View  Result Date: 10/10/2019 CLINICAL DATA:  Chest pain, history of COVID, tachycardia, by the EXAM: CHEST - 2 VIEW COMPARISON:  Chest x-ray 03/21/2019 FINDINGS: The heart size and mediastinal contours are within normal limits. No focal consolidation. No pulmonary edema. No pleural effusion. No pneumothorax. No acute osseous abnormality. IMPRESSION: No active cardiopulmonary disease. Electronically Signed   By: Iven Finn M.D.   On: 10/10/2019 21:58   CT Angio Chest PE W and/or Wo Contrast  Result Date: 10/11/2019 CLINICAL DATA:  PE suspected, high prob Chest pain.  Prior COVID. EXAM: CT ANGIOGRAPHY CHEST WITH CONTRAST TECHNIQUE: Multidetector CT imaging of the chest was performed using the standard protocol during bolus administration of intravenous contrast. Multiplanar CT image reconstructions and MIPs were obtained to evaluate the vascular anatomy. Patient reports vomiting with IV contrast, received antiemetics. No reported contrast reaction. CONTRAST:  48m OMNIPAQUE IOHEXOL 350 MG/ML SOLN COMPARISON:  Radiograph yesterday. FINDINGS: Cardiovascular: There are no filling defects within the pulmonary arteries to suggest pulmonary embolus. Thoracic aorta is normal in caliber. Common origin of the brachiocephalic and left common carotid artery, variant arch anatomy. Heart is normal in size. There is no pericardial fluid. Mediastinum/Nodes: Tiny hiatal hernia with minimal distal esophageal wall thickening. No enlarged mediastinal or hilar lymph nodes. No suspicious thyroid nodule.  Lungs/Pleura: Mild central bronchial wall thickening. Slight heterogeneous pulmonary parenchyma. No focal or confluent airspace disease. No pleural fluid. No findings of pulmonary edema. Upper Abdomen: No acute or unexpected findings. Musculoskeletal: There are no acute or suspicious osseous abnormalities. Review of the MIP images confirms the above findings. IMPRESSION: 1. No pulmonary embolus. 2. Mild central bronchial wall thickening with heterogeneous pulmonary parenchyma, can be seen with asthma or small airways disease. 3. Tiny hiatal hernia with minimal distal esophageal wall thickening, suggesting reflux or esophagitis. Electronically Signed   By: MKeith RakeM.D.   On: 10/11/2019 17:39    Procedures Procedures (including critical care time)  Medications Ordered in ED Medications  oxyCODONE-acetaminophen (PERCOCET/ROXICET) 5-325 MG per tablet 1 tablet (1 tablet Oral Given 10/11/19 0210)  acetaminophen (TYLENOL) tablet 650 mg (has no administration in time range)  ondansetron (ZOFRAN) injection 4 mg (4 mg Intravenous Given 10/11/19 1552)  iohexol (OMNIPAQUE) 350 MG/ML injection 50 mL (50 mLs Intravenous Contrast Given 10/11/19 1724)    ED Course  I have reviewed the triage vital signs and the nursing notes.  Pertinent labs & imaging results that were available during my care of the patient were reviewed by me and considered in my medical decision making (see chart for details).    MDM Rules/Calculators/A&P                          [age]year old female presenting for evaluation of chest discomfort and some shortness of breath that started last night.  Patient initially somewhat hypertensive, remainder of vital signs are within normal limits.  She does have some bilateral lower extremity swelling on exam but her exam is otherwise unremarkable.  Reviewed/interpreted labs CBC shows anemia and thrombocytosis which appears stable from prior labs 4 months ago BMP shows abnormal kidney  function which also appears baseline Pregnancy test is negative Delta troponins are negative D-dimer elevated - will proceed with CTA BNP normal - less likely CHF exacerbation but she does report noncompliance with lasix this week   EKG was reviewed by Dr. Roderic Palau which showed NSR, no acute ischemic change, and was unchanged from prior  Chest x-ray reviewed/interpreted- No active cardiopulmonary disease.  CTA chest does not show any evidence of PE.  She does have some evidence of reactive airway disease.  She also has a small hiatal hernia with evidence of esophagitis.    Discussed findings with patient.  She states she has had some persistent shortness of breath after having Covid earlier this year.  She has not been taking her inhaler at home.  Advised her to use this.  She is also on pantoprazole at home, will add H2 blocker to her regimen to help with her esophagitis.  Her work-up here today is reassuring and I do not see any indication for admission at this time.  I have low suspicion for ACS given negative delta troponins, nonischemic EKG and characteristics of symptoms.  Have advised close follow-up with her PCP and strict return precautions.  She voices understanding the plan and reasons to return.  All questions answered.  Patient stable for discharge.  Final Clinical Impression(s) / ED Diagnoses Final diagnoses:  Atypical chest pain  Shortness of breath    Rx / DC Orders ED Discharge Orders    None       Bishop Dublin 10/11/19 Pollyann Samples, MD 10/13/19 [phone]

## 2019-10-11 NOTE — Telephone Encounter (Signed)
Received a second page to the after-hours emergency call line from the patient.  She is currently in the emergency room at [facility] after being sent there from the urgent care.  They called EMS who brought her to the ED.  She is asking if she needs to stay because she has been waiting for several hours and is hurting and would like some pain medications and is also unsure if she needs to remain at the hospital.  I informed her that she needs to be evaluated by the emergency room providers to determine if she needs to be admitted or not.  I recommended that she stay for further evaluation and patient is agreeable to this. [name]

## 2019-10-12 ENCOUNTER — Encounter: Payer: Self-pay | Admitting: Family Medicine

## 2019-10-12 ENCOUNTER — Other Ambulatory Visit: Payer: Self-pay | Admitting: Family Medicine

## 2019-10-15 ENCOUNTER — Ambulatory Visit (INDEPENDENT_AMBULATORY_CARE_PROVIDER_SITE_OTHER): Payer: BC Managed Care – PPO | Admitting: Family Medicine

## 2019-10-15 ENCOUNTER — Telehealth: Payer: Self-pay | Admitting: *Deleted

## 2019-10-15 ENCOUNTER — Encounter: Payer: Self-pay | Admitting: Family Medicine

## 2019-10-15 ENCOUNTER — Other Ambulatory Visit: Payer: Self-pay

## 2019-10-15 DIAGNOSIS — E1122 Type 2 diabetes mellitus with diabetic chronic kidney disease: Secondary | ICD-10-CM

## 2019-10-15 DIAGNOSIS — E113313 Type 2 diabetes mellitus with moderate nonproliferative diabetic retinopathy with macular edema, bilateral: Secondary | ICD-10-CM | POA: Diagnosis not present

## 2019-10-15 DIAGNOSIS — Z794 Long term (current) use of insulin: Secondary | ICD-10-CM

## 2019-10-15 DIAGNOSIS — R600 Localized edema: Secondary | ICD-10-CM

## 2019-10-15 DIAGNOSIS — I129 Hypertensive chronic kidney disease with stage 1 through stage 4 chronic kidney disease, or unspecified chronic kidney disease: Secondary | ICD-10-CM

## 2019-10-15 DIAGNOSIS — N182 Chronic kidney disease, stage 2 (mild): Secondary | ICD-10-CM

## 2019-10-15 MED ORDER — FREESTYLE LIBRE 2 READER DEVI: 1.0000 | Freq: Four times a day (QID) | 0 refills | Status: DC

## 2019-10-15 MED ORDER — FREESTYLE LIBRE 2 SENSOR MISC: 1.0000 | Freq: Four times a day (QID) | 0 refills | Status: DC

## 2019-10-15 NOTE — Telephone Encounter (Signed)
Received a fax requesting a refill for albuterol (VENTOLIN) is has been done under a historical provider. Talyssa Gibas Zimmerman Rumple, CMA

## 2019-10-15 NOTE — Patient Instructions (Signed)
Continue taking 60 units of Tresiba once daily. Continue to 20 units with 3 meals as well as 12 units with snack between lunch and dinner. I am sending your prescription for the freestyle libre.  We will work on the prior authorization here.  Please come back in 2 weeks to check in on your diabetes. I would also like to discuss your blood pressure at the next visit as it has been high.

## 2019-10-15 NOTE — Progress Notes (Signed)
    SUBJECTIVE:  CHIEF COMPLAINT / HPI:   Diabetes, two week follow up  Now checking sugar four times a day. Elevations at lunch and dinner and patient adding an additional 12 untis of Humalog between lunch and dinner.  20 units at breakfast, at lunch and dinner.  Currently doing 30 and 30 tresiba.   Only time she was in the 300's was when she ran out of tresiba for two days. Denies any hypoglycemia. Lowest CBG at home in the 90's.   ED visit follow up  Patient reports that she was seen in the ED after complaints of chest pain.  Her work-up was negative.  She continues to be concerned about having blood clots in her body.  PERTINENT  PMH / PSH: poorly controlled diabetes, obesity, diabetic retinopathy, nephropathy    OBJECTIVE:  BP (!) 142/98   Pulse 93   Ht 5\' 2"  (1.575 m)   Wt 256 lb (116.1 kg)   LMP 10/09/2019 (Exact Date)   SpO2 98%   BMI 46.82 kg/m   General: NAD, non-toxic, well-appearing, sitting comfortably in chair. She has large visor over her eyes and is looking down for most of the visit due to eye procedure    Cardiovascular: RRR, normal S1, S2. B/L 2+ RP. 1+ pitting  BLEE Respiratory: CTAB. No IWOB.  Abdomen: + BS. NT, ND, soft to palpation.  Extremities: Warm and well perfused. Moving spontaneously.  Neuro: A & O x4. CN grossly intact. No FND  ASSESSMENT/PLAN:  Type 2 diabetes mellitus with both eyes affected by moderate nonproliferative retinopathy and macular edema, with long-term current use of insulin (HCC) Congratulated patient on improved blood sugars at home.  This is unlikely due to increasing 10/11/2019, more so likely due to compliance with Humalog with multiple blood sugar checks daily.  She denies any hypoglycemia.  Given improvement of patient's blood sugars, will order continuous glucose monitor today.  We will follow up again in 2 weeks.  Continue setting SMART goals with close follow-up.  Hypertension associated with stage 2 chronic kidney disease due  to type 2 diabetes mellitus (HCC) Blood pressure elevated to 142/98 today.  As we are working on small goals with each visit, will address at next visit.  Lower extremity edema Lower extremity edema likely due to venous insufficiency.  Patient concerned about blood clots after her recent ED visit with positive D-dimer.  She was concerned that only her chest was checked for clots and that there could possibly be other clots in other areas of her body that she would not know about.  She does report strong family history of blood clots but no personal history.  Patient is not having any acute onset warm extremities with extreme swelling.  She does have some chronic swelling at baseline.  Provided reassurance.  Provided return precautions to the emergency department.  Today, there is no clinical indication for patient to obtain DVT ultrasound or other imaging as requested by patient.     [place], MD [facility]

## 2019-10-16 ENCOUNTER — Telehealth: Payer: Self-pay | Admitting: *Deleted

## 2019-10-16 ENCOUNTER — Encounter: Payer: Self-pay | Admitting: Family Medicine

## 2019-10-16 DIAGNOSIS — E113522 Type 2 diabetes mellitus with proliferative diabetic retinopathy with traction retinal detachment involving the macula, left eye: Secondary | ICD-10-CM | POA: Diagnosis not present

## 2019-10-16 MED ORDER — FREESTYLE LIBRE 2 SENSOR MISC
1.0000 | Freq: Four times a day (QID) | 0 refills | Status: DC
Start: 2019-10-16 — End: 2019-12-11

## 2019-10-16 MED ORDER — FREESTYLE LIBRE 2 READER DEVI
1.0000 | Freq: Four times a day (QID) | 0 refills | Status: DC
Start: 2019-10-16 — End: 2020-02-18

## 2019-10-16 NOTE — Assessment & Plan Note (Addendum)
Lower extremity edema likely due to venous insufficiency.  Patient concerned about blood clots after her recent ED visit with positive D-dimer.  She was concerned that only her chest was checked for clots and that there could possibly be other clots in other areas of her body that she would not know about.  She does report strong family history of blood clots but no personal history.  Patient is not having any acute onset warm extremities with extreme swelling.  She does have some chronic swelling at baseline.  Provided reassurance.  Provided return precautions to the emergency department.  Today, there is no clinical indication for patient to obtain DVT ultrasound or other imaging as requested by patient.

## 2019-10-16 NOTE — Telephone Encounter (Signed)
Called the patient concerning her MyChart message:  Hello Dr. Jacques Navy, I have been experiencing chest pains immediately following surgery. May I make an appointment sooner than we planned?  She stated that she started having chest pain after her eye surgery on 9/27. She was advised to go to the ED by her PCP.   Per the ED:  Discussed findings with patient.  She states she has had some persistent shortness of breath after having Covid earlier this year.  She has not been taking her inhaler at home.  Advised her to use this.  She is also on pantoprazole at home, will add H2 blocker to her regimen to help with her esophagitis.  Her work-up here today is reassuring and I do not see any indication for admission at this time.  I have low suspicion for ACS given negative delta troponins, nonischemic EKG and characteristics of symptoms.  Have advised close follow-up with her PCP and strict return precautions.  The patient stated that she is still having intermittent pain on the left side. Nothing seems to relieves the pain and it comes randomly. She denies any other symptoms. She was not currently having pain. She has requested an appointment. One has been made for 10/14.  She has been advised to go to the ED if she is having consistent pain.

## 2019-10-16 NOTE — Assessment & Plan Note (Addendum)
Congratulated patient on improved blood sugars at home.  This is unlikely due to increasing [place], more so likely due to compliance with Humalog with multiple blood sugar checks daily.  She denies any hypoglycemia.  Given improvement of patient's blood sugars, will order continuous glucose monitor today.  We will follow up again in 2 weeks.  Continue setting SMART goals with close follow-up.

## 2019-10-16 NOTE — Assessment & Plan Note (Signed)
Blood pressure elevated to 142/98 today.  As we are working on small goals with each visit, will address at next visit.

## 2019-10-20 ENCOUNTER — Telehealth: Payer: Self-pay | Admitting: Family Medicine

## 2019-10-20 NOTE — Telephone Encounter (Signed)
Received fax about short term disability. Spoke to patient about this and recommended that she send this over to her ophthalmology for her diabetic retinopathy. Pt is agreeable.   Melene Plan, M.D.  2:34 PM 10/20/2019

## 2019-10-24 DIAGNOSIS — H02886 Meibomian gland dysfunction of left eye, unspecified eyelid: Secondary | ICD-10-CM | POA: Diagnosis not present

## 2019-10-24 DIAGNOSIS — H02883 Meibomian gland dysfunction of right eye, unspecified eyelid: Secondary | ICD-10-CM | POA: Diagnosis not present

## 2019-10-24 DIAGNOSIS — G44221 Chronic tension-type headache, intractable: Secondary | ICD-10-CM | POA: Diagnosis not present

## 2019-10-24 DIAGNOSIS — H04129 Dry eye syndrome of unspecified lacrimal gland: Secondary | ICD-10-CM | POA: Diagnosis not present

## 2019-10-25 ENCOUNTER — Ambulatory Visit: Payer: BC Managed Care – PPO | Admitting: Internal Medicine

## 2019-10-27 ENCOUNTER — Emergency Department (HOSPITAL_COMMUNITY)
Admission: EM | Admit: 2019-10-27 | Discharge: 2019-10-27 | Disposition: A | Discharge status: Home / Self Care | Payer: BC Managed Care – PPO | Attending: Emergency Medicine | Admitting: Emergency Medicine

## 2019-10-27 ENCOUNTER — Encounter (HOSPITAL_COMMUNITY): Payer: Self-pay | Admitting: Emergency Medicine

## 2019-10-27 ENCOUNTER — Other Ambulatory Visit: Payer: Self-pay

## 2019-10-27 ENCOUNTER — Emergency Department (HOSPITAL_COMMUNITY): Payer: BC Managed Care – PPO

## 2019-10-27 ENCOUNTER — Telehealth: Payer: Self-pay | Admitting: Family Medicine

## 2019-10-27 DIAGNOSIS — R079 Chest pain, unspecified: Secondary | ICD-10-CM | POA: Diagnosis not present

## 2019-10-27 DIAGNOSIS — Z8616 Personal history of COVID-19: Secondary | ICD-10-CM | POA: Insufficient documentation

## 2019-10-27 DIAGNOSIS — R1013 Epigastric pain: Secondary | ICD-10-CM | POA: Insufficient documentation

## 2019-10-27 DIAGNOSIS — E1122 Type 2 diabetes mellitus with diabetic chronic kidney disease: Secondary | ICD-10-CM | POA: Diagnosis not present

## 2019-10-27 DIAGNOSIS — I129 Hypertensive chronic kidney disease with stage 1 through stage 4 chronic kidney disease, or unspecified chronic kidney disease: Secondary | ICD-10-CM | POA: Insufficient documentation

## 2019-10-27 DIAGNOSIS — J9811 Atelectasis: Secondary | ICD-10-CM | POA: Diagnosis not present

## 2019-10-27 DIAGNOSIS — Z794 Long term (current) use of insulin: Secondary | ICD-10-CM | POA: Diagnosis not present

## 2019-10-27 DIAGNOSIS — Z79899 Other long term (current) drug therapy: Secondary | ICD-10-CM | POA: Insufficient documentation

## 2019-10-27 DIAGNOSIS — N182 Chronic kidney disease, stage 2 (mild): Secondary | ICD-10-CM | POA: Insufficient documentation

## 2019-10-27 DIAGNOSIS — R0789 Other chest pain: Secondary | ICD-10-CM

## 2019-10-27 LAB — TROPONIN I (HIGH SENSITIVITY)
Troponin I (High Sensitivity): 5 ng/L (ref <18)
Troponin I (High Sensitivity): 5 ng/L (ref <18)

## 2019-10-27 LAB — CBC
HCT: 29.6 % — ABNORMAL LOW (ref 36.0–46.0)
Hemoglobin: 9.5 g/dL — ABNORMAL LOW (ref 12.0–15.0)
MCH: 26.8 pg (ref 26.0–34.0)
MCHC: 32.1 g/dL (ref 30.0–36.0)
MCV: 83.6 fL (ref 80.0–100.0)
Platelets: 349 K/uL (ref 150–400)
RBC: 3.54 MIL/uL — ABNORMAL LOW (ref 3.87–5.11)
RDW: 13.2 % (ref 11.5–15.5)
WBC: 5.8 K/uL (ref 4.0–10.5)
nRBC: 0 % (ref 0.0–0.2)

## 2019-10-27 LAB — COMPREHENSIVE METABOLIC PANEL WITH GFR
ALT: 26 U/L (ref 0–44)
AST: 21 U/L (ref 15–41)
Albumin: 2.5 g/dL — ABNORMAL LOW (ref 3.5–5.0)
Alkaline Phosphatase: 112 U/L (ref 38–126)
Anion gap: 11 (ref 5–15)
BUN: 20 mg/dL (ref 6–20)
CO2: 24 mmol/L (ref 22–32)
Calcium: 9.1 mg/dL (ref 8.9–10.3)
Chloride: 101 mmol/L (ref 98–111)
Creatinine, Ser: 1.69 mg/dL — ABNORMAL HIGH (ref 0.44–1.00)
GFR, Estimated: 38 mL/min — ABNORMAL LOW (ref >60)
Glucose, Bld: 230 mg/dL — ABNORMAL HIGH (ref 70–99)
Potassium: 3.7 mmol/L (ref 3.5–5.1)
Sodium: 136 mmol/L (ref 135–145)
Total Bilirubin: 0.5 mg/dL (ref 0.3–1.2)
Total Protein: 6.1 g/dL — ABNORMAL LOW (ref 6.5–8.1)

## 2019-10-27 LAB — I-STAT BETA HCG BLOOD, ED (MC, WL, AP ONLY): I-stat hCG, quantitative: <5 m[IU]/mL (ref <5)

## 2019-10-27 LAB — LIPASE, BLOOD: Lipase: 34 U/L (ref 11–51)

## 2019-10-27 LAB — CBG MONITORING, ED
Glucose-Capillary: 225 mg/dL — ABNORMAL HIGH (ref 70–99)
Glucose-Capillary: 193 mg/dL — ABNORMAL HIGH (ref 70–99)

## 2019-10-27 MED ORDER — HYDROCODONE-ACETAMINOPHEN 5-325 MG PO TABS
1.0000 | ORAL_TABLET | Freq: Once | ORAL | Status: AC
  Administered 2019-10-27: 1 via ORAL
  Filled 2019-10-27: qty 1

## 2019-10-27 MED ORDER — MORPHINE SULFATE (PF) 4 MG/ML IV SOLN
4.0000 mg | Freq: Once | INTRAVENOUS | Status: DC
  Filled 2019-10-27: qty 1

## 2019-10-27 MED ORDER — SODIUM CHLORIDE 0.9 % IV BOLUS
1000.0000 mL | Freq: Once | INTRAVENOUS | Status: AC
  Administered 2019-10-27: 1000 mL via INTRAVENOUS

## 2019-10-27 MED ORDER — ALUM & MAG HYDROXIDE-SIMETH 200-200-20 MG/5ML PO SUSP
30.0000 mL | Freq: Once | ORAL | Status: AC
  Administered 2019-10-27: 30 mL via ORAL
  Filled 2019-10-27: qty 30

## 2019-10-27 MED ORDER — ONDANSETRON HCL 4 MG/2ML IJ SOLN
4.0000 mg | Freq: Once | INTRAMUSCULAR | Status: AC
  Administered 2019-10-27: 4 mg via INTRAVENOUS
  Filled 2019-10-27: qty 2

## 2019-10-27 NOTE — Discharge Instructions (Addendum)
You were seen today for chest and abdominal pain.  Your work-up is largely reassuring.  Your last CT scan shows some evidence of esophageal thickening which could indicate esophagitis.  You need to follow-up with gastroenterology for endoscopy.  Continue your Protonix daily as well as Pepcid.

## 2019-10-27 NOTE — ED Triage Notes (Signed)
Pt to triage via [facility] from home.  Reports she woke up at 5am with generalized abd cramping, diaphoresis, and chest pressure.  States her blood sugar was 432 and she took "extra diabetes meds".  EMS administered ASA 324mg .  Also reports nausea, vomiting, and soft stools.

## 2019-10-27 NOTE — ED Provider Notes (Signed)
[facility] EMERGENCY DEPARTMENT Provider Note   CSN: [id number] Arrival date & time: 10/27/19  2355     History Chief Complaint  Patient presents with  . Chest Pain  . Abdominal Pain    Karina [name] is a 34 y.o. female.  HPI     This is a 34 year old female with a history of diabetes, long-haul Covid, hypertension, obesity who presents with abdominal and chest pain. Patient reports that she woke up around midnight and noted her blood sugars to be elevated greater than 400. She took an extra dose of insulin. Around 5 AM she woke up with intense sharp upper abdominal pain and "like something was sitting on my chest." She states that the pain doubled her over and she had multiple episodes of nonbilious, nonbloody emesis. She also reports loose stools. She states that the pain took her breath away. Currently she rates her pain at 7 out of 10. It is nonradiating. She states that she has had some similar pains in the past but usually self resolves. She takes Protonix and Pepcid and reports that she has been taking these as prescribed. She was recently evaluated for some chest pain and had a negative PE study at the end of September. She is now fully vaccinated against COVID-19.  Past Medical History:  Diagnosis Date  . Acute pain of right shoulder 03/13/2019  . Back pain 12/12/2017  . Carpal tunnel syndrome of right wrist 03/13/2019  . COVID-19 01/24/2019  . Depression 12/27/2012  . Diabetes mellitus    Type 2, insulin resistant  . Diabetic neuropathy, type II diabetes mellitus ([place]) 05/23/2013  . DOE (dyspnea on exertion) 05/21/2019   Onset with covid 19 infection symptoms started 01/16/19  -  05/23/2019   Walked RA  3 laps @ approx 276f each @ moderat pace  stopped due to end of study,  no limiting sob/ sats 100% at end despite reported sob at rest   . Essential hypertension, benign 12/12/2013  . History of COVID-19 03/22/2019  . Lower extremity edema 08/29/2017  .  Moderate nonproliferative diabetic retinopathy ([place] 05/24/2013   Both OS and OD.  Diagnosed by Dr. SLoyal Buba MD, PhD 05/23/13. Also mild macular edema. Follows with Dr. GKaty Fitch . Morbid obesity ([facility] 05/25/2013  . Nephrotic syndrome due to diabetes mellitus ([place] 04/10/2019  . 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 Shortness of breath 03/22/2019  . Syncope 04/08/2015  . Tachycardia 05/21/2019  . Type 2 diabetes mellitus with both eyes affected by moderate nonproliferative retinopathy and macular edema, with long-term current use of insulin ([place] 12/27/2012       . Upper airway cough syndrome 03/22/2019    Patient Active Problem List   Diagnosis Date Noted  . Vitamin D deficiency 09/04/2019  . Stable proliferative diabetic retinopathy of both eyes associated with type 2 diabetes mellitus ([place] 08/17/2019  . Current mild episode of major depressive disorder ([place] 08/08/2019  . Sleep-related headache 07/05/2019  . 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, bilateral (HE[place] 07/05/2019  . Cephalalgia 07/05/2019  . Proliferative diabetic retinopathy of right eye ([place] 06/20/2019  . Proliferative diabetic retinopathy of left eye (HC[place] 06/20/2019  . Vitreous hemorrhage of left eye ([place] 06/20/2019  . DOE (dyspnea on exertion) 05/21/2019  . Tachycardia 05/21/2019  . History of COVID-19 03/22/2019  . Upper airway cough syndrome 03/22/2019  .  Shortness of breath 03/22/2019  . Carpal tunnel syndrome of right wrist 03/13/2019  . Gastroesophageal reflux disease 03/23/2018  . Lower extremity edema 08/29/2017  . Hypertension associated with stage 2 chronic kidney disease due to type 2 diabetes mellitus ([place]) 12/12/2013  . Morbid obesity ([place]) 05/25/2013  . Type 2 diabetes mellitus with both eyes affected by moderate nonproliferative retinopathy and  macular edema, with long-term current use of insulin ([place]) 12/27/2012  . Depression 12/27/2012    Past Surgical History:  Procedure Laterality Date  . CERVICAL BIOPSY  2004  . CESAREAN SECTION N/A 06/02/2012   Procedure: CESAREAN SECTION;  Surgeon: Mora Bellman, MD;  Location: [facility];  Service: Obstetrics;  Laterality: N/A;     OB History    Gravida  1   Para  1   Term  1   Preterm      AB      Living  1     SAB      TAB      Ectopic      Multiple      Live Births  1           Family History  Problem Relation Age of Onset  . Diabetes Father   . Cancer Mother   . Diabetes Mother     Social History   Tobacco Use  . Smoking status: Never Smoker  . Smokeless tobacco: Never Used  Vaping Use  . Vaping Use: Never used  Substance Use Topics  . Alcohol use: No  . Drug use: No    Home Medications Prior to Admission medications   Medication Sig Start Date End Date Taking? Authorizing Provider  albuterol (VENTOLIN HFA) 108 (90 Base) MCG/ACT inhaler Inhale 1-2 puffs into the lungs every 4 (four) hours as needed for shortness of breath.    [provider]  ALPHAGAN P 0.1 % SOLN Place 1 drop into both eyes 2 (two) times daily. 10/09/19   [provider]  benzonatate (TESSALON) 200 MG capsule Take three times a day until no longer coughing, then take as needed 05/23/19   Tanda Rockers, MD  Blood Pressure Monitoring (BLOOD PRESSURE MONITOR AUTOMAT) DEVI 1 kit by Does not apply route daily. Please call the office if you have any issues obtaining this monitor. 08/08/19   Wilber Oliphant, MD  Continuous Blood Gluc Receiver (FREESTYLE LIBRE 2 READER) DEVI 1 Device by Does not apply route in the morning, at noon, in the evening, and at bedtime. 10/16/19   Wilber Oliphant, MD  Continuous Blood Gluc Sensor (FREESTYLE LIBRE 2 SENSOR) MISC 1 Device by Does not apply route in the morning, at noon, in the evening, and at bedtime. 10/16/19   Wilber Oliphant, MD    dorzolamide-timolol (COSOPT) 22.3-6.8 MG/ML ophthalmic solution Place 1 drop into both eyes 2 (two) times daily. 10/09/19   [provider]  famotidine (PEPCID) 20 MG tablet Take 1 tablet (20 mg total) by mouth 2 (two) times daily. 10/11/19   Couture, Cortni S, PA-C  furosemide (LASIX) 20 MG tablet Take 1 tablet (20 mg total) by mouth daily. 09/06/19 12/05/19  Elouise Munroe, MD  Insulin Degludec (TRESIBA) 100 UNIT/ML SOLN Inject 30 Units into the skin 2 (two) times daily.    [provider]  insulin lispro (HUMALOG KWIKPEN) 200 UNIT/ML KwikPen Inject 20-28 Units into the skin 3 (three) times daily. Patient taking differently: Inject 16-20 Units into the skin 3 (three) times daily.  08/31/19 10/11/19  Wilber Oliphant, MD  Insulin Pen Needle (B-D ULTRAFINE III SHORT PEN) 31G X 8 MM MISC 1 Container by Does not apply route as needed. 08/08/19   Wilber Oliphant, MD  losartan (COZAAR) 100 MG tablet Take 100 mg by mouth daily. 05/04/19   [provider]  moxifloxacin (VIGAMOX) 0.5 % ophthalmic solution Place 1 drop into both eyes 3 (three) times daily.    [provider]  pantoprazole (PROTONIX) 40 MG tablet Take 30- 60 min before your first and last meals of the day Patient taking differently: Take 40 mg by mouth daily.  07/02/19   Tanda Rockers, MD  TRESIBA FLEXTOUCH 200 UNIT/ML FlexTouch Pen INJECT 50 UNITS SUBCUTANEOUSLY ONCE DAILY FOR 28 DAYS 10/12/19   Wilber Oliphant, MD  Vitamin D, Ergocalciferol, (DRISDOL) 1.25 MG (50000 UNIT) CAPS capsule Take 1 capsule (50,000 Units total) by mouth every 7 (seven) days. Patient taking differently: Take 50,000 Units by mouth every Friday.  09/06/19   Wilber Oliphant, MD  gabapentin (NEURONTIN) 100 MG capsule Take 1 capsule (100 mg total) by mouth at bedtime. 08/25/18 01/31/19  Kathrene Alu, MD    Allergies    Lisinopril, Eggs or egg-derived products, and Contrast media [iodinated diagnostic agents]  Review of Systems   Review of  Systems  Constitutional: Negative for chills and fever.  Respiratory: Positive for shortness of breath. Negative for cough.   Cardiovascular: Positive for chest pain.  Gastrointestinal: Positive for abdominal pain, nausea and vomiting. Negative for constipation and diarrhea.  Genitourinary: Negative for dysuria.  All other systems reviewed and are negative.   Physical Exam Updated Vital Signs BP 139/84 (BP Location: Left Arm)   Pulse 97   Temp 98.6 F (37 C) (Oral)   Resp 17   Ht 1.575 m (_0 )   Wt 114.8 kg   LMP 10/09/2019 (Exact Date)   SpO2 96%   BMI 46.27 kg/m   Physical Exam Vitals and nursing note reviewed.  Constitutional:      Appearance: She is well-developed. She is obese.     Comments: Chronically ill-appearing but nontoxic  HENT:     Head: Normocephalic and atraumatic.  Eyes:     Pupils: Pupils are equal, round, and reactive to light.  Cardiovascular:     Rate and Rhythm: Normal rate and regular rhythm.     Heart sounds: Normal heart sounds.  Pulmonary:     Effort: Pulmonary effort is normal. No respiratory distress.     Breath sounds: No wheezing.  Abdominal:     General: Bowel sounds are normal.     Palpations: Abdomen is soft.     Tenderness: There is abdominal tenderness.     Comments: Epigastric tenderness palpation, no rebound or guarding  Musculoskeletal:     Cervical back: Neck supple.     Comments: Trace bilateral lower extremity edema  Skin:    General: Skin is warm and dry.  Neurological:     Mental Status: She is alert and oriented to person, place, and time.  Psychiatric:        Mood and Affect: Mood normal.     ED Results / Procedures / Treatments   Labs (all labs ordered are listed, but only abnormal results are displayed) Labs Reviewed  COMPREHENSIVE METABOLIC PANEL - Abnormal; Notable for the following components:      Result Value   Glucose, Bld 230 (*)    Creatinine, Ser 1.69 (*)  Total Protein 6.1 (*)    Albumin 2.5  (*)    GFR, Estimated 38 (*)    All other components within normal limits  CBC - Abnormal; Notable for the following components:   RBC 3.54 (*)    Hemoglobin 9.5 (*)    HCT 29.6 (*)    All other components within normal limits  CBG MONITORING, ED - Abnormal; Notable for the following components:   Glucose-Capillary 225 (*)    All other components within normal limits  CBG MONITORING, ED - Abnormal; Notable for the following components:   Glucose-Capillary 193 (*)    All other components within normal limits  LIPASE, BLOOD  URINALYSIS, ROUTINE W REFLEX MICROSCOPIC  I-STAT BETA HCG BLOOD, ED ([facility], [facility], [facility] ONLY)  TROPONIN I (HIGH SENSITIVITY)  TROPONIN I (HIGH SENSITIVITY)    EKG None  Radiology DG Chest 2 View  Result Date: 10/27/2019 CLINICAL DATA:  Chest pain EXAM: CHEST - 2 VIEW COMPARISON:  10/10/2019 chest radiograph and prior. 10/11/2019 CTA chest. FINDINGS: Hazy bibasilar opacities. No pneumothorax or pleural effusion. Cardiomediastinal silhouette within normal limits. No acute osseous abnormality. IMPRESSION: Hazy bibasilar opacities, likely atelectasis. Electronically Signed   By: Primitivo Gauze M.D.   On: 10/27/2019 08:15    Procedures Procedures (including critical care time)  Medications Ordered in ED Medications  ondansetron (ZOFRAN) injection 4 mg (4 mg Intravenous Given 10/27/19 1132)  alum & mag hydroxide-simeth (MAALOX/MYLANTA) 200-200-20 MG/5ML suspension 30 mL (30 mLs Oral Given 10/27/19 1115)  sodium chloride 0.9 % bolus 1,000 mL (1,000 mLs Intravenous New Bag/Given 10/27/19 1138)  HYDROcodone-acetaminophen (NORCO/VICODIN) 5-325 MG per tablet 1 tablet (1 tablet Oral Given 10/27/19 1140)    ED Course  I have reviewed the triage vital signs and the nursing notes.  Pertinent labs & imaging results that were available during my care of the patient were reviewed by me and considered in my medical decision making (see chart for details).    MDM  Rules/Calculators/A&P                          Patient presents with abdominal chest pain.  She is overall nontoxic and vital signs are reassuring.  She was recently evaluated and has a cardiologist regarding her chest pain work-up.  She had a CT scan to rule out PE.  During that time she had evidence of esophageal thickening and is on Protonix and Pepcid.  Some of her features of her pain do suggest GI etiology.  However, atypical ACS is not excluded.  EKG shows no evidence of acute arrhythmia or ischemia.  Troponin x2 is stable and negative at 5.  Patient declined IV pain medication but was given oral pain medication and a GI cocktail with some improvement of her symptoms.  She is able to tolerate fluids without difficulty and no recurrent vomiting.  Do not feel she needs repeat imaging for PE as she just had a study and feel this is low risk.  Feel that her symptoms are most likely related to origin.  Would have her follow-up with gastroenterology for upper endoscopy.  In the meantime continue Protonix at home as prescribed.  She is on 40 mg daily already.  After history, exam, and medical workup I feel the patient has been appropriately medically screened and is safe for discharge home. Pertinent diagnoses were discussed with the patient. Patient was given return precautions.  Final Clinical Impression(s) / ED Diagnoses Final diagnoses:  Epigastric pain  Atypical chest pain    Rx / DC Orders ED Discharge Orders    None       Kalia Vahey, Barbette Hair, MD 10/27/19 1253

## 2019-10-29 NOTE — Telephone Encounter (Signed)
**  After Hours/ Emergency Line Call**  Received a call to report that Karina [name] having chest pain.  Patient already in ED when call was returned.    Katha Cabal, DO PGY-2, [facility] 10/29/2019 12:22 PM

## 2019-10-30 ENCOUNTER — Ambulatory Visit: Payer: BC Managed Care – PPO | Admitting: Family Medicine

## 2019-10-30 NOTE — Telephone Encounter (Signed)
Patient was scheduled an in office visit 10/14 with Dr. Jacques Navy to address chest pain issues. Patient was no show for appointment.

## 2019-10-31 ENCOUNTER — Ambulatory Visit: Payer: Self-pay | Admitting: Neurology

## 2019-11-02 ENCOUNTER — Other Ambulatory Visit: Payer: Self-pay

## 2019-11-02 ENCOUNTER — Ambulatory Visit (INDEPENDENT_AMBULATORY_CARE_PROVIDER_SITE_OTHER): Payer: BC Managed Care – PPO | Admitting: Family Medicine

## 2019-11-02 VITALS — BP 140/82 | HR 88 | Wt 260.0 lb

## 2019-11-02 DIAGNOSIS — E1122 Type 2 diabetes mellitus with diabetic chronic kidney disease: Secondary | ICD-10-CM

## 2019-11-02 DIAGNOSIS — I129 Hypertensive chronic kidney disease with stage 1 through stage 4 chronic kidney disease, or unspecified chronic kidney disease: Secondary | ICD-10-CM

## 2019-11-02 DIAGNOSIS — N182 Chronic kidney disease, stage 2 (mild): Secondary | ICD-10-CM

## 2019-11-03 ENCOUNTER — Encounter: Payer: Self-pay | Admitting: Family Medicine

## 2019-11-03 NOTE — Progress Notes (Signed)
Patient roomed but left without being seen.

## 2019-11-05 ENCOUNTER — Encounter: Payer: Self-pay | Admitting: Family Medicine

## 2019-11-07 ENCOUNTER — Other Ambulatory Visit: Payer: Self-pay | Admitting: Family Medicine

## 2019-11-07 DIAGNOSIS — IMO0002 Reserved for concepts with insufficient information to code with codable children: Secondary | ICD-10-CM

## 2019-11-07 DIAGNOSIS — E1165 Type 2 diabetes mellitus with hyperglycemia: Secondary | ICD-10-CM

## 2019-11-14 ENCOUNTER — Encounter: Payer: Self-pay | Admitting: Nurse Practitioner

## 2019-11-14 ENCOUNTER — Other Ambulatory Visit: Payer: Self-pay

## 2019-11-14 ENCOUNTER — Ambulatory Visit (INDEPENDENT_AMBULATORY_CARE_PROVIDER_SITE_OTHER): Payer: BC Managed Care – PPO | Admitting: Nurse Practitioner

## 2019-11-14 VITALS — BP 140/80 | Ht 62.0 in | Wt 260.0 lb

## 2019-11-14 DIAGNOSIS — R350 Frequency of micturition: Secondary | ICD-10-CM | POA: Diagnosis not present

## 2019-11-14 DIAGNOSIS — N898 Other specified noninflammatory disorders of vagina: Secondary | ICD-10-CM

## 2019-11-14 DIAGNOSIS — Z01419 Encounter for gynecological examination (general) (routine) without abnormal findings: Secondary | ICD-10-CM

## 2019-11-14 LAB — WET PREP FOR TRICH, YEAST, CLUE

## 2019-11-14 NOTE — Addendum Note (Signed)
Addended by: Tito Dine on: 11/14/2019 04:38 PM   Modules accepted: Orders

## 2019-11-14 NOTE — Progress Notes (Signed)
   Karina [name] 08/05/1985 [id number]   History:  34 y.o. G 1P1001 presents to establish care.  Regular monthly cycle. 2015 ASCUS negative HPV, subsequent paps normal. 2004 benign cervical biopsy. Complains of brown discharge after period that lasts a few days. She also notices a "fishy" vaginal odor after eating seafood. Denies itching. She has urinary frequency and says this has been ongoing with her diabetes. She denies dysuria, urgency, and hematuria. Not sexually active for 6 years. Mother diagnosed with breast cancer in her mid 7s, mother and patient are BRCA negative.   Gynecologic History LMP 11/06/2019 Contra/ception: abstinence Last Pap: 05/24/2016. Results were: normal  Past medical history, past surgical history, family history and social history were all reviewed and documented in the EPIC chart.  ROS:  A ROS was performed and pertinent positives and negatives are included.  Exam:  Vitals:   11/14/19 1536  BP: 140/80  Weight: 260 lb (117.9 kg)  Height: $Remove'5\' 2"'TyZqsPG$  (1.575 m)   Body mass index is 47.55 kg/m.  General appearance:  Normal Thyroid:  Symmetrical, normal in size, without palpable masses or nodularity. Respiratory  Auscultation:  Clear without wheezing or rhonchi Cardiovascular  Auscultation:  Regular rate, without rubs, murmurs or gallops  Edema/varicosities:  Not grossly evident Abdominal  Soft,nontender, without masses, guarding or rebound.  Liver/spleen:  No organomegaly noted  Hernia:  None appreciated  Skin  Inspection:  Grossly normal   Breasts: Examined lying and sitting.   Right: Without masses, retractions, discharge or axillary adenopathy.   Left: Without masses, retractions, discharge or axillary adenopathy. Gentitourinary   Inguinal/mons:  Normal without inguinal adenopathy  External genitalia:  Normal  BUS/Urethra/Skene's glands:  Normal  Vagina:  Normal  Cervix:  Difficult to visualize due to collapsing of vaginal walls and patient  tensing  Uterus:  Difficult to palpate due to body habitus but no gross masses or tenderness  Adnexa/parametria:     Rt: Without masses or tenderness.   Lt: Without masses or tenderness.  Anus and perineum: Normal  Wet prep negative  Assessment/Plan:  34 y.o. G1P1001 for annual exam.   Well female exam with routine gynecological exam - Education provided on SBEs, importance of preventative screenings, current guidelines, high calcium diet, regular exercise, and multivitamin daily. Labs done elsewhere.   Vaginal discharge - Plan: [place], CLUE. Unremarkable. Reassured that brown discharge after cycle is old blood. It is normal as long as other vaginal symptoms to not accompany the discharge. Fishy odor after eating seafood is normal as long as the odor does not persist.    Urinary frequency - This is not new for her and has been ongoing for years with her diabetes. Denies dysuria, urgency, or hematuria. Plan: Urinalysis w microscopic + reflex culture.   Screening for cervical cancer - 2004 benign cervical biopsy, 2015 ASCUS negative HPV, subsequent paps normal. Pap today.   Follow up in 1 year for annual.      Tamela Gammon [facility], 3:59 PM 11/14/2019

## 2019-11-14 NOTE — Patient Instructions (Signed)
Health Maintenance, Female Adopting a healthy lifestyle and getting preventive care are important in promoting health and wellness. Ask your health care provider about:  The right schedule for you to have regular tests and exams.  Things you can do on your own to prevent diseases and keep yourself healthy. What should I know about diet, weight, and exercise? Eat a healthy diet   Eat a diet that includes plenty of vegetables, fruits, low-fat dairy products, and lean protein.  Do not eat a lot of foods that are high in solid fats, added sugars, or sodium. Maintain a healthy weight Body mass index (BMI) is used to identify weight problems. It estimates body fat based on height and weight. Your health care provider can help determine your BMI and help you achieve or maintain a healthy weight. Get regular exercise Get regular exercise. This is one of the most important things you can do for your health. Most adults should:  Exercise for at least 150 minutes each week. The exercise should increase your heart rate and make you sweat (moderate-intensity exercise).  Do strengthening exercises at least twice a week. This is in addition to the moderate-intensity exercise.  Spend less time sitting. Even light physical activity can be beneficial. Watch cholesterol and blood lipids Have your blood tested for lipids and cholesterol at 36 years of age, then have this test every 5 years. Have your cholesterol levels checked more often if:  Your lipid or cholesterol levels are high.  You are older than 36 years of age.  You are at high risk for heart disease. What should I know about cancer screening? Depending on your health history and family history, you may need to have cancer screening at various ages. This may include screening for:  Breast cancer.  Cervical cancer.  Colorectal cancer.  Skin cancer.  Lung cancer. What should I know about heart disease, diabetes, and high blood  pressure? Blood pressure and heart disease  High blood pressure causes heart disease and increases the risk of stroke. This is more likely to develop in people who have high blood pressure readings, are of African descent, or are overweight.  Have your blood pressure checked: ? Every 3-5 years if you are 18-39 years of age. ? Every year if you are 40 years old or older. Diabetes Have regular diabetes screenings. This checks your fasting blood sugar level. Have the screening done:  Once every three years after age 40 if you are at a normal weight and have a low risk for diabetes.  More often and at a younger age if you are overweight or have a high risk for diabetes. What should I know about preventing infection? Hepatitis B If you have a higher risk for hepatitis B, you should be screened for this virus. Talk with your health care provider to find out if you are at risk for hepatitis B infection. Hepatitis C Testing is recommended for:  Everyone born from 1945 through 1965.  Anyone with known risk factors for hepatitis C. Sexually transmitted infections (STIs)  Get screened for STIs, including gonorrhea and chlamydia, if: ? You are sexually active and are younger than 36 years of age. ? You are older than 36 years of age and your health care provider tells you that you are at risk for this type of infection. ? Your sexual activity has changed since you were last screened, and you are at increased risk for chlamydia or gonorrhea. Ask your health care provider if   you are at risk.  Ask your health care provider about whether you are at high risk for HIV. Your health care provider may recommend a prescription medicine to help prevent HIV infection. If you choose to take medicine to prevent HIV, you should first get tested for HIV. You should then be tested every 3 months for as long as you are taking the medicine. Pregnancy  If you are about to stop having your period (premenopausal) and  you may become pregnant, seek counseling before you get pregnant.  Take 400 to 800 micrograms (mcg) of folic acid every day if you become pregnant.  Ask for birth control (contraception) if you want to prevent pregnancy. Osteoporosis and menopause Osteoporosis is a disease in which the bones lose minerals and strength with aging. This can result in bone fractures. If you are 65 years old or older, or if you are at risk for osteoporosis and fractures, ask your health care provider if you should:  Be screened for bone loss.  Take a calcium or vitamin D supplement to lower your risk of fractures.  Be given hormone replacement therapy (HRT) to treat symptoms of menopause. Follow these instructions at home: Lifestyle  Do not use any products that contain nicotine or tobacco, such as cigarettes, e-cigarettes, and chewing tobacco. If you need help quitting, ask your health care provider.  Do not use street drugs.  Do not share needles.  Ask your health care provider for help if you need support or information about quitting drugs. Alcohol use  Do not drink alcohol if: ? Your health care provider tells you not to drink. ? You are pregnant, may be pregnant, or are planning to become pregnant.  If you drink alcohol: ? Limit how much you use to 0-1 drink a day. ? Limit intake if you are breastfeeding.  Be aware of how much alcohol is in your drink. In the [place] one drink equals one 12 oz bottle of beer (355 mL), one 5 oz glass of wine (148 mL), or one 1 oz glass of hard liquor (44 mL). General instructions  Schedule regular health, dental, and eye exams.  Stay current with your vaccines.  Tell your health care provider if: ? You often feel depressed. ? You have ever been abused or do not feel safe at home. Summary  Adopting a healthy lifestyle and getting preventive care are important in promoting health and wellness.  Follow your health care provider's instructions about healthy  diet, exercising, and getting tested or screened for diseases.  Follow your health care provider's instructions on monitoring your cholesterol and blood pressure. This information is not intended to replace advice given to you by your health care provider. Make sure you discuss any questions you have with your health care provider. Document Revised: 12/21/2017 Document Reviewed: 12/21/2017 Elsevier Patient Education  2020 [employer]vier [employer].  

## 2019-11-15 LAB — URINALYSIS W MICROSCOPIC + REFLEX CULTURE
Bacteria, UA: NONE SEEN /HPF
Bilirubin Urine: NEGATIVE
Hyaline Cast: NONE SEEN /LPF
Ketones, ur: NEGATIVE
Leukocyte Esterase: NEGATIVE
Nitrites, Initial: NEGATIVE
Specific Gravity, Urine: 1.018 (ref 1.001–1.03)
pH: 6.5 (ref 5.0–8.0)

## 2019-11-15 LAB — NO CULTURE INDICATED

## 2019-11-16 ENCOUNTER — Encounter: Payer: Self-pay | Admitting: Internal Medicine

## 2019-11-16 ENCOUNTER — Ambulatory Visit (INDEPENDENT_AMBULATORY_CARE_PROVIDER_SITE_OTHER): Payer: Self-pay | Admitting: Internal Medicine

## 2019-11-16 VITALS — BP 154/84 | HR 87 | Ht 62.0 in | Wt 265.0 lb

## 2019-11-16 DIAGNOSIS — R112 Nausea with vomiting, unspecified: Secondary | ICD-10-CM

## 2019-11-16 DIAGNOSIS — R935 Abnormal findings on diagnostic imaging of other abdominal regions, including retroperitoneum: Secondary | ICD-10-CM

## 2019-11-16 DIAGNOSIS — R1013 Epigastric pain: Secondary | ICD-10-CM

## 2019-11-16 DIAGNOSIS — K219 Gastro-esophageal reflux disease without esophagitis: Secondary | ICD-10-CM

## 2019-11-16 LAB — PAP IG W/ RFLX HPV ASCU

## 2019-11-16 MED ORDER — PANTOPRAZOLE SODIUM 40 MG PO TBEC
40.0000 mg | DELAYED_RELEASE_TABLET | Freq: Every day | ORAL | 3 refills | Status: DC
Start: 2019-11-16 — End: 2020-12-15

## 2019-11-16 NOTE — Patient Instructions (Signed)
You have been scheduled for an abdominal ultrasound at [facility] (1st floor of hospital) on 11/21/2019 at 11:00am. Please arrive 15 minutes prior to your appointment for registration. Make certain not to have anything to eat or drink after midnight prior to your appointment. Should you need to reschedule your appointment, please contact radiology at [phone]. This test typically takes about 30 minutes to perform.  You have been scheduled for an endoscopy. Please follow written instructions given to you at your visit today. If you use inhalers (even only as needed), please bring them with you on the day of your procedure.  Continue your Pantoprazole

## 2019-11-16 NOTE — Progress Notes (Signed)
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  Patient presents today after being referred by the emergency room regarding acute abdominal pain.  Patient tells me that she has had approximately 10 episodes of acute abdominal pain this year.  She describes epigastric or mid abdominal pain without radiation.  Occasionally associated with nausea and vomiting.  May last anywhere between 1 and 3 hours.  Can awaken her from sleep.  She does report intermittent problems with regurgitation and pyrosis.  No dysphagia.  She will have abdominal bloating at times.  No hematemesis or melena.  She was injected Covid January 2021.  Her most recent bout of acute abdominal pain occurred October 27, 2019 for which she sought care at the emergency room.  Also complaining of chest discomfort at that time.  Laboratories from that evaluation were remarkable for normal liver tests and normal lipase.  Normal white blood cell count.  Anemia with hemoglobin 9.5.  Cardiac work-up was negative.  Chest x-ray revealed atelectasis.  She did have a CT angio of the chest October 11, 2019 due to chest pain.  No pulmonary embolus.  She was noted to have a hiatal hernia as well as possible esophagitis.  At the emergency room she was placed on pantoprazole 40 mg daily and famotidine 40 mg twice daily.  She has not had recurrent problems with pain since.  Issues with GERD have resolved.  She has been vaccinated post Covid infection.  REVIEW OF SYSTEMS:  All non-GI ROS negative as otherwise stated in the HPI except for visual change, lower extremity swelling, increased thirst, sleeping problems  Past Medical History:  Diagnosis Date  . Acute pain of right shoulder 03/13/2019  . Back pain 12/12/2017  . Carpal tunnel syndrome of right wrist 03/13/2019  . COVID-19 01/24/2019  .  Depression 12/27/2012  . Diabetes mellitus    Type 2, insulin resistant  . Diabetic neuropathy, type II diabetes mellitus (HCC) 05/23/2013  . DOE (dyspnea on exertion) 05/21/2019   Onset with covid 19 infection symptoms started 01/16/19  -  05/23/2019   Walked RA  3 laps @ approx 257ft each @ moderat pace  stopped due to end of study,  no limiting sob/ sats 100% at end despite reported sob at rest   . Essential hypertension, benign 12/12/2013  . History of COVID-19 03/22/2019  . Lower extremity edema 08/29/2017  . Moderate nonproliferative diabetic retinopathy (HCC) 05/24/2013   Both OS and OD.  Diagnosed by Dr. Altamease Oiler, MD, PhD 05/23/13. Also mild macular edema. Follows with Dr. Dione Booze  . Morbid obesity (HCC) 05/25/2013  . Nephrotic syndrome due to diabetes mellitus (HCC) 04/10/2019  . 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 Shortness of breath 03/22/2019  . Syncope 04/08/2015  . Tachycardia 05/21/2019  . 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 Upper airway cough syndrome 03/22/2019    Past Surgical History:  Procedure Laterality Date  . CERVICAL BIOPSY  2004  . CESAREAN SECTION N/A 06/02/2012   Procedure: CESAREAN SECTION;  Surgeon: Catalina Antigua, MD;  Location: [facility];  Service: Obstetrics;  Laterality: N/A;    Social History Karina [name]  reports that she has never smoked. She has never used smokeless tobacco. She reports  that she does not drink alcohol and does not use drugs.  family history includes Cancer in her mother; Diabetes in her father and mother.  Allergies  Allergen Reactions  . Lisinopril Cough  . Eggs Or Egg-Derived Products Nausea And Vomiting  . Contrast Media [Iodinated Diagnostic Agents]        PHYSICAL EXAMINATION: Vital signs: BP (!) 154/84   Pulse 87   Ht 5\' 2"  (1.575 m)   Wt 265 lb (120.2 kg)   BMI 48.47 kg/m   Constitutional: Pleasant  but obese and unhealthy appearing, no acute distress Psychiatric: alert and oriented x3, cooperative Eyes: extraocular movements intact, anicteric, conjunctiva pink Mouth: oral pharynx moist, no lesions Neck: supple no lymphadenopathy Cardiovascular: heart regular rate and rhythm, no murmur Lungs: clear to auscultation bilaterally Abdomen: soft, obese, nontender, nondistended, no obvious ascites, no peritoneal signs, normal bowel sounds, no organomegaly Rectal: Omitted Extremities: no clubbing or cyanosis.  1+ lower extremity edema bilaterally Skin: no lesions on visible extremities Neuro: No focal deficits.  Cranial nerves intact  ASSESSMENT:  1.  Recurrent acute upper abdominal pain.  Possibilities include GERD, PUD, and gallstones. 2.  GERD.  Improved on PPI.  CT with esophageal thickening 3.  Morbid obesity 4.  Multiple significant medical problems   PLAN:  1.  Reflux precautions with attention to weight loss 2.  Continue pantoprazole daily.  We have refilled her prescription 3.  Schedule abdominal ultrasound to rule out gallstones 4.  Schedule upper endoscopy to evaluate recurrent pain and esophageal thickening on CT.  The patient is HIGH RISK given her comorbidities and body habitus.  She will require monitored anesthesia care for her sedation.The nature of the procedure, as well as the risks, benefits, and alternatives were carefully and thoroughly reviewed with the patient. Ample time for discussion and questions allowed. The patient understood, was satisfied, and agreed to proceed.

## 2019-11-20 ENCOUNTER — Ambulatory Visit (INDEPENDENT_AMBULATORY_CARE_PROVIDER_SITE_OTHER): Payer: BC Managed Care – PPO | Admitting: Family Medicine

## 2019-11-20 ENCOUNTER — Encounter: Payer: Self-pay | Admitting: Family Medicine

## 2019-11-20 ENCOUNTER — Other Ambulatory Visit: Payer: Self-pay

## 2019-11-20 DIAGNOSIS — E113313 Type 2 diabetes mellitus with moderate nonproliferative diabetic retinopathy with macular edema, bilateral: Secondary | ICD-10-CM | POA: Diagnosis not present

## 2019-11-20 DIAGNOSIS — N182 Chronic kidney disease, stage 2 (mild): Secondary | ICD-10-CM

## 2019-11-20 DIAGNOSIS — Z794 Long term (current) use of insulin: Secondary | ICD-10-CM | POA: Diagnosis not present

## 2019-11-20 DIAGNOSIS — E1122 Type 2 diabetes mellitus with diabetic chronic kidney disease: Secondary | ICD-10-CM

## 2019-11-20 DIAGNOSIS — I129 Hypertensive chronic kidney disease with stage 1 through stage 4 chronic kidney disease, or unspecified chronic kidney disease: Secondary | ICD-10-CM

## 2019-11-20 MED ORDER — LOSARTAN POTASSIUM-HCTZ 100-25 MG PO TABS: 1.0000 | ORAL_TABLET | Freq: Every day | ORAL | 1 refills | Status: DC

## 2019-11-20 NOTE — Patient Instructions (Addendum)
Diabetes We would like to titrate to the [place] so that your fasting sugars are around 150.  Please add 1 unit of Tresiba every day that your fasting sugar is above 150.  Once your fasting sugars are stable around 150, you can stop the titration.  Please make sure that you keep a close eye on your blood sugars throughout the day, as this may result in having to adjust your mealtime short acting insulin. Please follow-up with [place] in 2 weeks for diabetes follow up.   High Blood Pressure I am going to contact Dr. Signe Colt at [facility] for recommendations on your blood pressure medications before we make any changes.

## 2019-11-20 NOTE — Progress Notes (Signed)
SUBJECTIVE:  CHIEF COMPLAINT / HPI:   Diabetes Mellitus Type II, Follow-up Last seen for diabetes 1 months ago.  Management since then includes Tresiba 60 units + 2-24 units humalog TID WC. She reports good compliance with treatment. She is not having side effects.  Denies fatigue, nausea/vmoiting, appetite changes, paraesthesias, polyuria/polydipsia, visual changes. Denies hypoglycemia with tremulousness, diaphoresis, tachycardia. Reports diabetic foot checks at home.  Wt Readings from Last 3 Encounters:  11/20/19 266 lb 12.8 oz (121 kg)  11/16/19 265 lb (120.2 kg)  11/14/19 260 lb (117.9 kg)  Home blood sugar records: fasting range: 230's   0800-0830 Fasting sugars 230's.  Take 60 units of Tresiba +20 units Humalog 1130-1200  sugars around 230s.  20-24 units of Humalog with meal 1600-1700 **Prior to CGM, patient was taking snack time Humalog (see previous notes).     Reports not taking snack time since starting CGM.** 2300  sugars around 300s.  Takes 22 units of Humalog.  Episodes of hypoglycemia? No  Current insulin regiment: Tresiba 60 units + 2-24 units humalog TID WC.  Hypertension  Ran out of losartan 100 mg (Rx 05/04/19).  Last clinic note from [facility] shows that patient was placed on losartan 100 mg.  Patient reports that she has been taking the losartan 100 mg at night.  She does report running out of the 100 mg tablets and has since been taking two 50 mg tablets.  She also reports taking Lasix in the afternoon.  She reports having 1-2 voids with the Lasix and does not think she is urinating as much as previously.  She does not check her blood pressure at home.  She did by manual cuff, but has had a lot of trouble with it.  She reports that she has been on amlodipine in the past and experienced severe lower extremity swelling to where she could not fit in her pants or shoes.  She would like to avoid this medication. She denies any chest pain, shortness of  breath.  On her last encounter, patient's blood pressure is listed as 154/84.  At our last visit, patient's blood pressure is 140/82.  Today, blood pressure 180/97 and she reports not taking her medication last night.  PERTINENT  PMH / PSH: CKD 3b, proliferative diabetic retinopathy of both eyes, macular degeneration, depression, poorly controlled type 2 diabetes, morbid obesity  OBJECTIVE:  BP (!) 180/97   Pulse (!) 115   Ht 5\' 2"  (1.575 m)   Wt 266 lb 12.8 oz (121 kg)   SpO2 98%   BMI 48.80 kg/m   General: Patient overall well-appearing on exam. MSK: 1+ BLEE   ASSESSMENT/PLAN:  Hypertension associated with stage 2 chronic kidney disease due to type 2 diabetes mellitus (HCC) [facility]  Would like to speak with Dr. [place] prior to changing any medications.  Patient reports that she was last seen with Dr. Signe Colt a couple months ago.  Spoke to Dr. Signe Colt medical assistant who is faxing the most recent records from September visit over to [facility] family practice office.  She also has left a message for Dr. [facility] to call me back to discuss patient's hypertension regimen.  Patient ideally needs a second medication in did consider losartan hydrochlorothiazide.  Can also consider chlorthalidone or indapamide.  It does not sound like patient's urine output is responding to the 20 mg of Lasix.  Will discuss with Dr. Signe Colt and reviewed with patient.  She is following up for diabetes in 2 weeks.  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  She does have elevations to the 300s around 11 PM.  She reports not taking the snack dose in the middle afternoon as she had previously when she started the continuous glucose monitor.  As her blood sugars are elevated throughout the day, I have recommended that  patient add 1 unit of Tresiba on daily until fasting blood sugar is around 150.  Have also instructed patient to monitor her mealtime insulin and titrate insulin as necessary.  Also discussed possibly starting SGLT2 at next A1c check if A1c is less than 10.  Patient is doing very well with adherence to medications as compared to the past.  She has struggled greatly in the past with noncompliance and missed appointments.  Patient has done very well with close follow-up and setting small goals with each visit.  Patient congratulated on great improvement overall.  We have discussed ultimately reaching her goal of getting to an insulin pump once her sugars are better controlled.  Will speak to Dr. Raymondo Band about office's policy insulin pumps.   Melene Plan, MD [facility]

## 2019-11-20 NOTE — Assessment & Plan Note (Signed)
Patient's blood sugars in the 230s during the day.  She does have elevations to the 300s around 11 PM.  She reports not taking the snack dose in the middle afternoon as she had previously when she started the continuous glucose monitor.  As her blood sugars are elevated throughout the day, I have recommended that patient add 1 unit of Tresiba on daily until fasting blood sugar is around 150.  Have also instructed patient to monitor her mealtime insulin and titrate insulin as necessary.  Also discussed possibly starting SGLT2 at next A1c check if A1c is less than 10.  Patient is doing very well with adherence to medications as compared to the past.  She has struggled greatly in the past with noncompliance and missed appointments.  Patient has done very well with close follow-up and setting small goals with each visit.  Patient congratulated on great improvement overall.  We have discussed ultimately reaching her goal of getting to an insulin pump once her sugars are better controlled.  Will speak to Dr. Raymondo Band about office's policy insulin pumps.

## 2019-11-20 NOTE — Assessment & Plan Note (Signed)
Patient is also followed by [facility].  Would like to speak with Dr. Signe Colt prior to changing any medications.  Patient reports that she was last seen with Dr. Signe Colt a couple months ago.  Spoke to Dr. Beaulah Corin medical assistant who is faxing the most recent records from September visit over to [facility] family practice office.  She also has left a message for Dr. Signe Colt to call me back to discuss patient's hypertension regimen.  Patient ideally needs a second medication in did consider losartan hydrochlorothiazide.  Can also consider chlorthalidone or indapamide.  It does not sound like patient's urine output is responding to the 20 mg of Lasix.  Will discuss with Dr. Signe Colt and reviewed with patient.  She is following up for diabetes in 2 weeks.

## 2019-11-21 ENCOUNTER — Ambulatory Visit (HOSPITAL_COMMUNITY)
Admission: RE | Admit: 2019-11-21 | Discharge: 2019-11-21 | Disposition: A | Discharge status: Home / Self Care | Payer: BC Managed Care – PPO | Source: Ambulatory Visit | Attending: Internal Medicine | Admitting: Internal Medicine

## 2019-11-21 DIAGNOSIS — R1013 Epigastric pain: Secondary | ICD-10-CM

## 2019-11-21 DIAGNOSIS — K219 Gastro-esophageal reflux disease without esophagitis: Secondary | ICD-10-CM | POA: Diagnosis not present

## 2019-11-21 DIAGNOSIS — R112 Nausea with vomiting, unspecified: Secondary | ICD-10-CM

## 2019-11-23 ENCOUNTER — Telehealth: Payer: Self-pay | Admitting: Internal Medicine

## 2019-11-23 NOTE — Telephone Encounter (Signed)
Called patient regarding Disability paperwork received from [facility], Dr.Acharya deferred the patient to have her PCP to complete this paper work. Patient did not want a copy of blank disability forms that was offered.

## 2019-11-27 ENCOUNTER — Other Ambulatory Visit: Payer: Self-pay

## 2019-11-27 DIAGNOSIS — E1165 Type 2 diabetes mellitus with hyperglycemia: Secondary | ICD-10-CM

## 2019-11-27 DIAGNOSIS — IMO0002 Reserved for concepts with insufficient information to code with codable children: Secondary | ICD-10-CM

## 2019-11-27 MED ORDER — HUMALOG KWIKPEN 200 UNIT/ML ~~LOC~~ SOPN: PEN_INJECTOR | SUBCUTANEOUS | 0 refills | Status: DC

## 2019-11-27 NOTE — Telephone Encounter (Signed)
Patient calls nurse line regarding concerns for humalog refill. Patient has increased frequency of insulin,therefore, needs sooner refill than usual. Patient reports taking insulin before breakfast, lunch, between lunch and dinner for afternoon snack, and after dinner.   Per insurance guidelines, rx will need to be updated to reflect this change or insurance will not cover.   To PCP  Please advise  Veronda Prude, RN

## 2019-12-07 ENCOUNTER — Other Ambulatory Visit: Payer: Self-pay | Admitting: Family Medicine

## 2019-12-07 DIAGNOSIS — K219 Gastro-esophageal reflux disease without esophagitis: Secondary | ICD-10-CM | POA: Diagnosis not present

## 2019-12-07 DIAGNOSIS — E559 Vitamin D deficiency, unspecified: Secondary | ICD-10-CM | POA: Diagnosis not present

## 2019-12-07 DIAGNOSIS — E1165 Type 2 diabetes mellitus with hyperglycemia: Secondary | ICD-10-CM | POA: Diagnosis not present

## 2019-12-10 ENCOUNTER — Ambulatory Visit: Payer: BC Managed Care – PPO | Admitting: Family Medicine

## 2019-12-10 ENCOUNTER — Other Ambulatory Visit: Payer: Self-pay | Admitting: Family Medicine

## 2019-12-10 NOTE — Progress Notes (Deleted)
 Current Outpatient Medications  Medication Instructions  . albuterol (VENTOLIN HFA) 108 (90 Base) MCG/ACT inhaler 1-2 puffs, Inhalation, Every 4 hours PRN  . ALPHAGAN P 0.1 % SOLN 1 drop, Both Eyes, 2 times daily  . benzonatate (TESSALON) 200 MG capsule Take three times a day until no longer coughing, then take as needed  . Blood Pressure Monitoring (BLOOD PRESSURE MONITOR AUTOMAT) DEVI 1 kit, Does not apply, Daily, Please call the office if you have any issues obtaining this monitor.  . Continuous Blood Gluc Receiver (FREESTYLE LIBRE 2 READER) DEVI 1 Device, Does not apply, 4 times daily  . Continuous Blood Gluc Sensor (FREESTYLE LIBRE 2 SENSOR) MISC 1 Device, Does not apply, 4 times daily  . dorzolamide-timolol (COSOPT) 22.3-6.8 MG/ML ophthalmic solution 1 drop, Both Eyes, 2 times daily  . famotidine (PEPCID) 20 mg, Oral, 2 times daily  . furosemide (LASIX) 20 mg, Oral, Daily  . insulin lispro (HUMALOG KWIKPEN) 200 UNIT/ML KwikPen INJECT 20 TO 28 UNITS SUBCUTANEOUSLY THREE TIMES DAILY  . Insulin Pen Needle (B-D ULTRAFINE III SHORT PEN) 31G X 8 MM MISC 1 Container, Does not apply, As needed  . moxifloxacin (VIGAMOX) 0.5 % ophthalmic solution 1 drop, Both Eyes, 3 times daily  . pantoprazole (PROTONIX) 40 mg, Oral, Daily  . TRESIBA FLEXTOUCH 200 UNIT/ML FlexTouch Pen INJECT 50 UNITS SUBCUTANEOUSLY ONCE DAILY FOR 28 DAYS  . Tresiba 30 Units, Subcutaneous, 2 times daily  . Vitamin D (Ergocalciferol) (DRISDOL) 50,000 Units, Oral, Every 7 days   Health Maintenance Due  Topic Date Due  . PNEUMOCOCCAL POLYSACCHARIDE VACCINE AGE 2-64 HIGH RISK  Never done  . PAP SMEAR-Modifier  05/25/2019  . FOOT EXAM  08/25/2019  . URINE MICROALBUMIN  08/25/2019   PHQ9 SCORE ONLY 11/20/2019 10/15/2019 10/02/2019  PHQ-9 Total Score 8 0 1   SUBJECTIVE:  CHIEF COMPLAINT / HPI:   Diabetes Follow up  ***   Hypertension  ***    PERTINENT  PMH / PSH: *** Patient Active Problem List   Diagnosis Date Noted   . Vitamin D deficiency 09/04/2019  . Stable proliferative diabetic retinopathy of both eyes associated with type 2 diabetes mellitus (HCC) 08/17/2019  . Current mild episode of major depressive disorder (HCC) 08/08/2019  . Sleep-related headache 07/05/2019  . 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, bilateral (HCC) 07/05/2019  . Cephalalgia 07/05/2019  . Proliferative diabetic retinopathy of right eye (HCC) 06/20/2019  . Proliferative diabetic retinopathy of left eye (HCC) 06/20/2019  . Vitreous hemorrhage of left eye (HCC) 06/20/2019  . DOE (dyspnea on exertion) 05/21/2019  . Tachycardia 05/21/2019  . History of COVID-19 03/22/2019  . Upper airway cough syndrome 03/22/2019  . Shortness of breath 03/22/2019  . Carpal tunnel syndrome of right wrist 03/13/2019  . Gastroesophageal reflux disease 03/23/2018  . Lower extremity edema 08/29/2017  . Hypertension associated with stage 2 chronic kidney disease due to type 2 diabetes mellitus (HCC) 12/12/2013  . Morbid obesity (HCC) 05/25/2013  . 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 Depression 12/27/2012    OBJECTIVE:  There were no vitals taken for this visit.  ***  ASSESSMENT/PLAN:  No problem-specific Assessment & Plan notes found for this encounter.    Rachel E Kim, MD [facility]  

## 2019-12-14 ENCOUNTER — Encounter: Payer: Self-pay | Admitting: Internal Medicine

## 2019-12-14 ENCOUNTER — Ambulatory Visit (AMBULATORY_SURGERY_CENTER): Payer: Medicaid Other | Admitting: Internal Medicine

## 2019-12-14 ENCOUNTER — Other Ambulatory Visit: Payer: Self-pay

## 2019-12-14 VITALS — BP 162/103 | HR 99 | Temp 97.1°F | Resp 13 | Ht 62.0 in | Wt 265.0 lb

## 2019-12-14 DIAGNOSIS — R112 Nausea with vomiting, unspecified: Secondary | ICD-10-CM

## 2019-12-14 DIAGNOSIS — R1013 Epigastric pain: Secondary | ICD-10-CM | POA: Diagnosis not present

## 2019-12-14 DIAGNOSIS — R109 Unspecified abdominal pain: Secondary | ICD-10-CM | POA: Diagnosis not present

## 2019-12-14 DIAGNOSIS — K219 Gastro-esophageal reflux disease without esophagitis: Secondary | ICD-10-CM

## 2019-12-14 DIAGNOSIS — R935 Abnormal findings on diagnostic imaging of other abdominal regions, including retroperitoneum: Secondary | ICD-10-CM

## 2019-12-14 MED ORDER — SODIUM CHLORIDE 0.9 % IV SOLN: 500.0000 mL | Freq: Once | INTRAVENOUS | Status: DC

## 2019-12-14 NOTE — Op Note (Signed)
[facility] Patient Name: Karina [name] Procedure Date: 12/14/2019 11:21 AM MRN: [id number] Endoscopist: Wilhemina Bonito. Marina Goodell , MD Age: 34 Referring MD:  Date of Birth: 16-Jan-1985 Gender: Female Account #: [id number] Procedure:                Upper GI endoscopy Indications:              Epigastric abdominal pain, Esophageal reflux,                            Abnormal CT of the GI tract (esophagus), Nausea                            with vomiting Medicines:                Monitored Anesthesia Care Procedure:                Pre-Anesthesia Assessment:                           - Prior to the procedure, a History and Physical                            was performed, and patient medications and                            allergies were reviewed. The patient's tolerance of                            previous anesthesia was also reviewed. The risks                            and benefits of the procedure and the sedation                            options and risks were discussed with the patient.                            All questions were answered, and informed consent                            was obtained. Prior Anticoagulants: The patient has                            taken no previous anticoagulant or antiplatelet                            agents. ASA Grade Assessment: II - A patient with                            mild systemic disease. After reviewing the risks                            and benefits, the patient was deemed in  satisfactory condition to undergo the procedure.                           After obtaining informed consent, the endoscope was                            passed under direct vision. Throughout the                            procedure, the patient's blood pressure, pulse, and                            oxygen saturations were monitored continuously. The                            Endoscope was introduced through the mouth,  and                            advanced to the second part of duodenum. The upper                            GI endoscopy was accomplished without difficulty.                            The patient tolerated the procedure well. Scope In: Scope Out: Findings:                 The esophagus was normal.                           The stomach was normal.                           The examined duodenum was normal.                           The cardia and gastric fundus were normal on                            retroflexion. Complications:            No immediate complications. Estimated Blood Loss:     Estimated blood loss: none. Impression:               1. GERD                           2. Normal EGD. Recommendation:           1. Reflux precautions                           2. Weight loss                           3. Continue pantoprazole daily                           4. Return to the care  of your primary provider. GI                            follow-up as needed Wilhemina Bonito. Marina Goodell, MD 12/14/2019 11:50:30 AM This report has been signed electronically.

## 2019-12-14 NOTE — Patient Instructions (Signed)
Discharge instructions given. ?Normal exam. ?Resume previous medications. ?YOU HAD AN ENDOSCOPIC PROCEDURE TODAY AT THE [facility]:   Refer to the procedure report that was given to you for any specific questions about what was found during the examination.  If the procedure report does not answer your questions, please call your gastroenterologist to clarify.  If you requested that your care partner not be given the details of your procedure findings, then the procedure report has been included in a sealed envelope for you to review at your convenience later. ? ?YOU SHOULD EXPECT: Some feelings of bloating in the abdomen. Passage of more gas than usual.  Walking can help get rid of the air that was put into your GI tract during the procedure and reduce the bloating. If you had a lower endoscopy (such as a colonoscopy or flexible sigmoidoscopy) you may notice spotting of blood in your stool or on the toilet paper. If you underwent a bowel prep for your procedure, you may not have a normal bowel movement for a few days. ? ?Please Note:  You might notice some irritation and congestion in your nose or some drainage.  This is from the oxygen used during your procedure.  There is no need for concern and it should clear up in a day or so. ? ?SYMPTOMS TO REPORT IMMEDIATELY: ? ? ?Following upper endoscopy (EGD) ? Vomiting of blood or coffee ground material ? New chest pain or pain under the shoulder blades ? Painful or persistently difficult swallowing ? New shortness of breath ? Fever of 100?F or higher ? Black, tarry-looking stools ? ?For urgent or emergent issues, a gastroenterologist can be reached at any hour by calling [phone]. ?Do not use MyChart messaging for urgent concerns.  ? ? ?DIET:  We do recommend a small meal at first, but then you may proceed to your regular diet.  Drink plenty of fluids but you should avoid alcoholic beverages for 24 hours. ? ?ACTIVITY:  You should plan to take it easy  for the rest of today and you should NOT DRIVE or use heavy machinery until tomorrow (because of the sedation medicines used during the test).   ? ?FOLLOW UP: ?Our staff will call the number listed on your records 48-72 hours following your procedure to check on you and address any questions or concerns that you may have regarding the information given to you following your procedure. If we do not reach you, we will leave a message.  We will attempt to reach you two times.  During this call, we will ask if you have developed any symptoms of COVID 19. If you develop any symptoms (ie: fever, flu-like symptoms, shortness of breath, cough etc.) before then, please call [phone].  If you test positive for Covid 19 in the 2 weeks post procedure, please call and report this information to us.   ? ?If any biopsies were taken you will be contacted by phone or by letter within the next 1-3 weeks.  Please call us at [phone] if you have not heard about the biopsies in 3 weeks.  ? ? ?SIGNATURES/CONFIDENTIALITY: ?You and/or your care partner have signed paperwork which will be entered into your electronic medical record.  These signatures attest to the fact that that the information above on your After Visit Summary has been reviewed and is understood.  Full responsibility of the confidentiality of this discharge information lies with you and/or your care-partner.  ?

## 2019-12-14 NOTE — Progress Notes (Signed)
A and O x3. Report to RN. Tolerated MAC anesthesia well.Teeth unchanged after procedure.

## 2019-12-14 NOTE — Progress Notes (Signed)
VS by CW. ?

## 2019-12-18 ENCOUNTER — Telehealth: Payer: Self-pay | Admitting: *Deleted

## 2019-12-18 NOTE — Telephone Encounter (Signed)
Follow up call made. 

## 2019-12-18 NOTE — Telephone Encounter (Signed)
  Follow up Call-  Call back number 12/14/2019  Post procedure Call Back phone  # [phone]  Permission to leave phone message Yes  Some recent data might be hidden     Patient questions:  Do you have a fever, pain , or abdominal swelling? No. Pain Score  0 *  Have you tolerated food without any problems? Yes.    Have you been able to return to your normal activities? Yes.    Do you have any questions about your discharge instructions: Diet   No. Medications  No. Follow up visit  No.  Do you have questions or concerns about your Care? No.  Actions: * If pain score is 4 or above: No action needed, pain <4.  1. Have you developed a fever since your procedure? no  2.   Have you had an respiratory symptoms (SOB or cough) since your procedure? no  3.   Have you tested positive for COVID 19 since your procedure no  4.   Have you had any family members/close contacts diagnosed with the COVID 19 since your procedure?  no   If yes to any of these questions please route to Laverna Peace, RN and Karlton Lemon, RN

## 2019-12-24 ENCOUNTER — Telehealth: Payer: Self-pay | Admitting: Internal Medicine

## 2019-12-24 NOTE — Telephone Encounter (Signed)
12/24/19   Patient reporting 2 weeks of increased cough and congestion for the last 2 weeks.  She reports she is been using [employer].  Has had increased drainage in the back of her throat.  She has received the first two COVID-19 vaccinations.  Patient denies fevers.  Last seen by Dr. Sherene Sires in June/2021.  Plan: Patient to present to our office on 12/25/2019 for an in person physical evaluation  Nothing further needed at this time  Elisha Headland FNP

## 2019-12-25 ENCOUNTER — Encounter: Payer: Self-pay | Admitting: Primary Care

## 2019-12-25 ENCOUNTER — Other Ambulatory Visit: Payer: Self-pay

## 2019-12-25 ENCOUNTER — Ambulatory Visit: Payer: Medicaid Other | Admitting: Primary Care

## 2019-12-25 VITALS — BP 138/90 | HR 98 | Temp 98.0°F | Ht 62.0 in | Wt 265.8 lb

## 2019-12-25 DIAGNOSIS — R058 Other specified cough: Secondary | ICD-10-CM

## 2019-12-25 DIAGNOSIS — R0602 Shortness of breath: Secondary | ICD-10-CM | POA: Diagnosis not present

## 2019-12-25 LAB — CBC WITH DIFFERENTIAL/PLATELET
Basophils Absolute: 0 10*3/uL (ref 0.0–0.1)
Basophils Relative: 0.7 % (ref 0.0–3.0)
Eosinophils Absolute: 0.1 10*3/uL (ref 0.0–0.7)
Eosinophils Relative: 2 % (ref 0.0–5.0)
HCT: 29 % — ABNORMAL LOW (ref 36.0–46.0)
Hemoglobin: 9.6 g/dL — ABNORMAL LOW (ref 12.0–15.0)
Lymphocytes Relative: 26.4 % (ref 12.0–46.0)
Lymphs Abs: 1.7 10*3/uL (ref 0.7–4.0)
MCHC: 33.1 g/dL (ref 30.0–36.0)
MCV: 81.6 fl (ref 78.0–100.0)
Monocytes Absolute: 0.3 10*3/uL (ref 0.1–1.0)
Monocytes Relative: 5 % (ref 3.0–12.0)
Neutro Abs: 4.3 10*3/uL (ref 1.4–7.7)
Neutrophils Relative %: 65.9 % (ref 43.0–77.0)
Platelets: 347 10*3/uL (ref 150.0–400.0)
RBC: 3.56 Mil/uL — ABNORMAL LOW (ref 3.87–5.11)
RDW: 14.5 % (ref 11.5–15.5)
WBC: 6.5 10*3/uL (ref 4.0–10.5)

## 2019-12-25 LAB — BASIC METABOLIC PANEL
BUN: 19 mg/dL (ref 6–23)
CO2: 25 mEq/L (ref 19–32)
Calcium: 8.8 mg/dL (ref 8.4–10.5)
Chloride: 99 mEq/L (ref 96–112)
Creatinine, Ser: 1.79 mg/dL — ABNORMAL HIGH (ref 0.40–1.20)
GFR: 36.07 mL/min — ABNORMAL LOW (ref >60.00)
Glucose, Bld: 424 mg/dL — ABNORMAL HIGH (ref 70–99)
Potassium: 4.5 mEq/L (ref 3.5–5.1)
Sodium: 130 mEq/L — ABNORMAL LOW (ref 135–145)

## 2019-12-25 LAB — BRAIN NATRIURETIC PEPTIDE: Pro B Natriuretic peptide (BNP): 24 pg/mL (ref 0.0–100.0)

## 2019-12-25 MED ORDER — FLOVENT HFA 110 MCG/ACT IN AERO: 2.0000 | INHALATION_SPRAY | Freq: Two times a day (BID) | RESPIRATORY_TRACT | 1 refills | Status: DC

## 2019-12-25 MED ORDER — BENZONATATE 200 MG PO CAPS: ORAL_CAPSULE | ORAL | 1 refills | Status: DC

## 2019-12-25 NOTE — Patient Instructions (Addendum)
Recommendations: - Continue Pantoprazole twice daily per GI   Rx: - Trial Flovent inhaler- take 2 puffs twice daily  (rinse mouth after) - Refill tessalon perles- take three times a day as needed  - Start over Zyrtec 10mg  daily or Claritin  Orders: - Pulmonary function testing  Follow-up: - 1 month fu with Dr. or myself- televisit ok      Food Choices for Gastroesophageal Reflux Disease, Adult When you have gastroesophageal reflux disease (GERD), the foods you eat and your eating habits are very important. Choosing the right foods can help ease your discomfort. Think about working with a nutrition specialist (dietitian) to help you make good choices. What are tips for following this plan?  Meals  Choose healthy foods that are low in fat, such as fruits, vegetables, whole grains, low-fat dairy products, and lean meat, fish, and poultry.  Eat small meals often instead of 3 large meals a day. Eat your meals slowly, and in a place where you are relaxed. Avoid bending over or lying down until 2-3 hours after eating.  Avoid eating meals 2-3 hours before bed.  Avoid drinking a lot of liquid with meals.  Cook foods using methods other than frying. Bake, grill, or broil food instead.  Avoid or limit: ? Chocolate. ? Peppermint or spearmint. ? Alcohol. ? Pepper. ? Black and decaffeinated coffee. ? Black and decaffeinated tea. ? Bubbly (carbonated) soft drinks. ? Caffeinated energy drinks and soft drinks.  Limit high-fat foods such as: ? Fatty meat or fried foods. ? Whole milk, cream, butter, or ice cream. ? Nuts and nut butters. ? Pastries, donuts, and sweets made with butter or shortening.  Avoid foods that cause symptoms. These foods may be different for everyone. Common foods that cause symptoms include: ? Tomatoes. ? Oranges, lemons, and limes. ? Peppers. ? Spicy food. ? Onions and garlic. ? Vinegar. Lifestyle  Maintain a healthy weight. Ask your doctor what  weight is healthy for you. If you need to lose weight, work with your doctor to do so safely.  Exercise for at least 30 minutes for 5 or more days each week, or as told by your doctor.  Wear loose-fitting clothes.  Do not smoke. If you need help quitting, ask your doctor.  Sleep with the head of your bed higher than your feet. Use a wedge under the mattress or blocks under the bed frame to raise the head of the bed. Summary  When you have gastroesophageal reflux disease (GERD), food and lifestyle choices are very important in easing your symptoms.  Eat small meals often instead of 3 large meals a day. Eat your meals slowly, and in a place where you are relaxed.  Limit high-fat foods such as fatty meat or fried foods.  Avoid bending over or lying down until 2-3 hours after eating.  Avoid peppermint and spearmint, caffeine, alcohol, and chocolate. This information is not intended to replace advice given to you by your health care provider. Make sure you discuss any questions you have with your health care provider. Document Revised: 04/20/2018 Document Reviewed: 02/03/2016 Elsevier Patient Education  2020 02/05/2016.

## 2019-12-25 NOTE — Progress Notes (Signed)
$'@Patient'k$  ID: Karina [name], female    DOB: 01-13-1985, 34 y.o.   MRN: [id number]  Chief Complaint  Patient presents with  . Follow-up    Pt states she has had an off and on cough x4 months which did stop but then began up again 3 weeks ago which is now persistent. Pt also has complaints of wheezing and also has SOB when active.    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   HPI: 34 year old female, never smoked. PMH significant UACS, GERD, hypertension, type 2 diabetes, hx covid-19, vit D deficiency, lower extremity swelling, obesity. Patient of Dr. Melvyn Novas, last seen on 07/02/19.   12/25/2019 Patient presents today for acute visit. Reports 2 weeks of increased cough. Associated dyspnea, wheezing, fluid retention. Weight is up 13 lbs since September 2021. She last took lasix two days ago. She saw neurology for sleep consult, dx with OSA. Awaiting CPAP d/t [employer], television/film/video. She has started the process to potentially get bariatric surgery. She is compliant with pantoprazole $RemoveBeforeDEI'40mg'ReZlleXHXIzaspMW$  twice daily, GI took her off pepcid. She had a normal EGD on 12/14/19. Denies post nasal drip symptoms.   Dyspnea: With exertion and associated wheezing  Cough: Intermittent dry cough Nocturnal symptoms: Dx with sleep apnea , nocturnal cough  SABA: Rare   Allergies  Allergen Reactions  . Lisinopril Cough  . Eggs Or Egg-Derived Products Nausea And Vomiting  . Contrast Media [Iodinated Diagnostic Agents]     Immunization History  Administered Date(s) Administered  . Influenza Split 12/20/2011  . Influenza,inj,Quad PF,6+ Mos 11/10/2015  . MMR  06/03/2012  . PFIZER SARS-COV-2 Vaccination 04/04/2019, 04/25/2019  . Tdap 03/13/2012    Past Medical History:  Diagnosis Date  . Acute pain of right shoulder 03/13/2019  . Allergy   . Back pain 12/12/2017  . Carpal tunnel syndrome of right wrist 03/13/2019  . COVID-19 01/24/2019  . Depression 12/27/2012  . Diabetes mellitus    Type 2, insulin resistant  . Diabetic neuropathy, type II diabetes mellitus ([place]) 05/23/2013  . DOE (dyspnea on exertion) 05/21/2019   Onset with covid 19 infection symptoms started 01/16/19  -  05/23/2019   Walked RA  3 laps @ approx 283ft each @ moderat pace  stopped due to end of study,  no limiting sob/ sats 100% at end despite reported sob at rest   . Essential hypertension, benign 12/12/2013  . GERD (gastroesophageal reflux disease)   . History of COVID-19 03/22/2019  . Lower extremity edema 08/29/2017  . Moderate nonproliferative diabetic retinopathy ([place]) 05/24/2013   Both OS and OD.  Diagnosed by Dr. Loyal Buba, MD, PhD 05/23/13. Also mild macular edema. Follows with Dr. Katy Fitch  . Morbid obesity ([place]) 05/25/2013  . Nephrotic syndrome due to diabetes mellitus ([place]) 04/10/2019  . 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 Shortness of breath 03/22/2019  . Sleep apnea    doesn't have machine yet  . Syncope 04/08/2015  . Tachycardia 05/21/2019  . 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  Upper airway cough syndrome 03/22/2019    Tobacco History: Social History   Tobacco Use  Smoking Status Never Smoker  Smokeless Tobacco Never Used   Counseling given: Not Answered   Outpatient Medications Prior to Visit  Medication Sig Dispense Refill  . albuterol (VENTOLIN HFA) 108 (90 Base) MCG/ACT inhaler Inhale 1-2 puffs into the lungs every 4 (four) hours as needed for shortness of breath.    . Blood Pressure Monitoring (BLOOD PRESSURE MONITOR AUTOMAT) DEVI 1 kit  by Does not apply route daily. Please call the office if you have any issues obtaining this monitor. 1 each 0  . Cholecalciferol (VITAMIN D3) 50 MCG (2000 UT) CHEW Chew 2,000 Units by mouth daily.    . Continuous Blood Gluc Receiver (FREESTYLE LIBRE 2 READER) DEVI 1 Device by Does not apply route in the morning, at noon, in the evening, and at bedtime. 1 each 0  . Continuous Blood Gluc Sensor (FREESTYLE LIBRE 2 SENSOR) MISC AS DIRECTED IN  THE  MORNING,  AT  NOON,  IN  THE  EVENING  AND  AT  BEDTIME 1 each 0  . furosemide (LASIX) 40 MG tablet Take 40 mg by mouth.    . hydroxypropyl methylcellulose / hypromellose (ISOPTO TEARS / GONIOVISC) 2.5 % ophthalmic solution Place 1 drop into both eyes as needed for dry eyes.    . Insulin Degludec (TRESIBA) 100 UNIT/ML SOLN Inject 60 Units into the skin daily.     . insulin lispro (HUMALOG KWIKPEN) 200 UNIT/ML KwikPen INJECT 20 TO 28 UNITS SUBCUTANEOUSLY THREE TIMES DAILY 12 mL 0  . Insulin Pen Needle (B-D ULTRAFINE III SHORT PEN) 31G X 8 MM MISC 1 Container by Does not apply route as needed. 1 each 11  . pantoprazole (PROTONIX) 40 MG tablet Take 1 tablet (40 mg total) by mouth daily. 90 tablet 3  . furosemide (LASIX) 20 MG tablet Take 1 tablet (20 mg total) by mouth daily. 90 tablet 3  . benzonatate (TESSALON) 200 MG capsule Take three times a day until no longer coughing, then take as needed (Patient not taking: Reported on 12/25/2019) 45 capsule 1  . famotidine (PEPCID) 20 MG tablet Take 1 tablet (20 mg total) by mouth 2 (two) times daily. 30 tablet 0  . hydroxypropyl methylcellulose / hypromellose (ISOPTO TEARS / GONIOVISC) 2.5 % ophthalmic solution Place 1 drop into the left eye daily.    . Vitamin D, Ergocalciferol, (DRISDOL) 1.25 MG (50000 UNIT) CAPS capsule Take 1 capsule (50,000 Units total) by mouth every 7 (seven) days. 8 capsule 0   No facility-administered medications prior to visit.   Review of Systems  Review of Systems  Constitutional:  Negative.   Respiratory: Positive for cough, shortness of breath and wheezing.   Cardiovascular: Positive for leg swelling.   Physical Exam  BP 138/90 (BP Location: Right Arm, Cuff Size: Large)   Pulse 98   Temp 98 F (36.7 C) (Oral)   Ht $R'5\' 2"'Jy$  (1.575 m)   Wt 265 lb 12.8 oz (120.6 kg)   LMP 12/04/2019   SpO2 98%   BMI 48.62 kg/m  Physical Exam Constitutional:      Appearance: Normal appearance.  HENT:     Mouth/Throat:     Mouth: Mucous membranes are moist.     Pharynx: Oropharynx is clear.     Comments: Mallampati class III Cardiovascular:     Rate and Rhythm: Normal rate and regular rhythm.  Pulmonary:     Effort: Pulmonary effort  is normal.     Breath sounds: Normal breath sounds. No wheezing, rhonchi or rales.  Musculoskeletal:        General: Normal range of motion.  Skin:    General: Skin is warm and dry.  Neurological:     General: No focal deficit present.     Mental Status: She is alert and oriented to person, place, and time. Mental status is at baseline.  Psychiatric:        Mood and Affect: Mood normal.        Behavior: Behavior normal.        Thought Content: Thought content normal.        Judgment: Judgment normal.      Lab Results:  CBC    Component Value Date/Time   WBC 5.8 10/27/2019 0758   RBC 3.54 (L) 10/27/2019 0758   HGB 9.5 (L) 10/27/2019 0758   HGB 11.2 08/25/2018 1102   HCT 29.6 (L) 10/27/2019 0758   HCT 35.2 08/25/2018 1102   PLT 349 10/27/2019 0758   PLT 410 08/25/2018 1102   MCV 83.6 10/27/2019 0758   MCV 82 08/25/2018 1102   MCH 26.8 10/27/2019 0758   MCHC 32.1 10/27/2019 0758   RDW 13.2 10/27/2019 0758   RDW 13.3 08/25/2018 1102   LYMPHSABS 1.8 05/24/2019 1031   MONOABS 0.4 05/24/2019 1031   EOSABS 0.2 05/24/2019 1031   BASOSABS 0.1 05/24/2019 1031    BMET    Component Value Date/Time   NA 136 10/27/2019 0758   NA 136 08/31/2019 1425   K 3.7 10/27/2019 0758   CL 101 10/27/2019 0758   CO2 24 10/27/2019 0758    GLUCOSE 230 (H) 10/27/2019 0758   BUN 20 10/27/2019 0758   BUN 20 08/31/2019 1425   CREATININE 1.69 (H) 10/27/2019 0758   CREATININE 0.93 03/21/2015 1230   CALCIUM 9.1 10/27/2019 0758   GFRNONAA 38 (L) 10/27/2019 0758   GFRNONAA 82 03/21/2015 1230   GFRAA 48 (L) 10/10/2019 2133   GFRAA >89 03/21/2015 1230    BNP    Component Value Date/Time   BNP 26.8 10/11/2019 1216    ProBNP    Component Value Date/Time   PROBNP 27.0 05/24/2019 1031    Imaging: No results found.   Assessment & Plan:   Upper airway cough syndrome Patient has had increase dry cough over 2 weeks. Associated dyspnea on exertion and wheezing. She is compliant with Protonix $RemoveBefor'40mg'lMCpfAJPNVMG$  twice daily. She had a normal EGD in December 2021 with GI. Clinical symptoms most consistent with possible underlying asthma. Ordering PFTs. Trial Flovent 175mcg 2 puffs twice daily.  Also recommend adding OTC antihistamine such as Zyrtec or Claritin. Refill tesslon perles TID prn cough. Advised patient to follow GERD diet. FU in 4 weeks.    Martyn Ehrich, NP 12/25/2019

## 2019-12-25 NOTE — Assessment & Plan Note (Addendum)
Patient has had increase dry cough over 2 weeks. Associated dyspnea on exertion and wheezing. She is compliant with Protonix 40mg  twice daily. She had a normal EGD in December 2021 with GI. Clinical symptoms most consistent with possible underlying asthma. Ordering PFTs. Trial Flovent January 2022 2 puffs twice daily.  Also recommend adding OTC antihistamine such as Zyrtec or Claritin. Refill tesslon perles TID prn cough. Advised patient to follow GERD diet. FU in 4 weeks.

## 2019-12-26 ENCOUNTER — Other Ambulatory Visit: Payer: Self-pay | Admitting: *Deleted

## 2019-12-26 DIAGNOSIS — R0602 Shortness of breath: Secondary | ICD-10-CM

## 2019-12-26 DIAGNOSIS — R0609 Other forms of dyspnea: Secondary | ICD-10-CM

## 2019-12-26 DIAGNOSIS — R0600 Dyspnea, unspecified: Secondary | ICD-10-CM

## 2019-12-26 LAB — IGE: IgE (Immunoglobulin E), Serum: 676 kU/L — ABNORMAL HIGH (ref <=114)

## 2019-12-28 ENCOUNTER — Other Ambulatory Visit: Payer: Self-pay

## 2019-12-28 ENCOUNTER — Ambulatory Visit (INDEPENDENT_AMBULATORY_CARE_PROVIDER_SITE_OTHER): Payer: Medicaid Other | Admitting: Internal Medicine

## 2019-12-28 DIAGNOSIS — R0600 Dyspnea, unspecified: Secondary | ICD-10-CM

## 2019-12-28 DIAGNOSIS — R0609 Other forms of dyspnea: Secondary | ICD-10-CM

## 2019-12-28 DIAGNOSIS — R0602 Shortness of breath: Secondary | ICD-10-CM | POA: Diagnosis not present

## 2019-12-28 LAB — PULMONARY FUNCTION TEST
DL/VA % pred: 125 %
DL/VA: 5.71 ml/min/mmHg/L
DLCO cor % pred: 99 %
DLCO cor: 21.52 ml/min/mmHg
DLCO unc % pred: 85 %
DLCO unc: 18.5 ml/min/mmHg
FEF 25-75 Post: 2.69 L/sec
FEF 25-75 Pre: 2.48 L/sec
FEF2575-%Change-Post: 8 %
FEF2575-%Pred-Post: 95 %
FEF2575-%Pred-Pre: 87 %
FEV1-%Change-Post: 1 %
FEV1-%Pred-Post: 84 %
FEV1-%Pred-Pre: 83 %
FEV1-Post: 2.12 L
FEV1-Pre: 2.09 L
FEV1FVC-%Change-Post: 3 %
FEV1FVC-%Pred-Pre: 102 %
FEV6-%Change-Post: -1 %
FEV6-%Pred-Post: 83 %
FEV6-%Pred-Pre: 84 %
FEV6-Post: 2.39 L
FEV6-Pre: 2.42 L
FEV6FVC-%Pred-Post: 104 %
FEV6FVC-%Pred-Pre: 104 %
FVC-%Change-Post: -1 %
FVC-%Pred-Post: 80 %
FVC-%Pred-Pre: 81 %
FVC-Post: 2.39 L
FVC-Pre: 2.42 L
Post FEV1/FVC ratio: 89 %
Post FEV6/FVC ratio: 100 %
Pre FEV1/FVC ratio: 86 %
Pre FEV6/FVC Ratio: 100 %
RV % pred: 128 %
RV: 1.81 L
TLC % pred: 96 %
TLC: 4.61 L

## 2019-12-28 NOTE — Progress Notes (Signed)
Full PFT performed today. °

## 2020-01-01 ENCOUNTER — Other Ambulatory Visit: Payer: Self-pay | Admitting: Family Medicine

## 2020-01-07 ENCOUNTER — Encounter: Payer: Self-pay | Admitting: Family Medicine

## 2020-01-07 ENCOUNTER — Ambulatory Visit (INDEPENDENT_AMBULATORY_CARE_PROVIDER_SITE_OTHER): Payer: Medicaid Other | Admitting: Family Medicine

## 2020-01-07 ENCOUNTER — Other Ambulatory Visit: Payer: Self-pay

## 2020-01-07 VITALS — BP 152/92 | HR 95 | Ht 62.0 in | Wt 263.8 lb

## 2020-01-07 DIAGNOSIS — E113313 Type 2 diabetes mellitus with moderate nonproliferative diabetic retinopathy with macular edema, bilateral: Secondary | ICD-10-CM

## 2020-01-07 DIAGNOSIS — I129 Hypertensive chronic kidney disease with stage 1 through stage 4 chronic kidney disease, or unspecified chronic kidney disease: Secondary | ICD-10-CM

## 2020-01-07 DIAGNOSIS — N182 Chronic kidney disease, stage 2 (mild): Secondary | ICD-10-CM | POA: Diagnosis not present

## 2020-01-07 DIAGNOSIS — Z794 Long term (current) use of insulin: Secondary | ICD-10-CM

## 2020-01-07 DIAGNOSIS — Z23 Encounter for immunization: Secondary | ICD-10-CM | POA: Diagnosis not present

## 2020-01-07 DIAGNOSIS — E1122 Type 2 diabetes mellitus with diabetic chronic kidney disease: Secondary | ICD-10-CM

## 2020-01-07 LAB — POCT GLYCOSYLATED HEMOGLOBIN (HGB A1C): Hemoglobin A1C: 11.5 % — AB (ref 4.0–5.6)

## 2020-01-07 MED ORDER — INSULIN ASPART FLEXPEN 100 UNIT/ML ~~LOC~~ SOPN: 20.0000 [IU] | PEN_INJECTOR | Freq: Three times a day (TID) | SUBCUTANEOUS | 3 refills | Status: DC

## 2020-01-07 MED ORDER — TRESIBA FLEXTOUCH 200 UNIT/ML ~~LOC~~ SOPN: 30.0000 [IU] | PEN_INJECTOR | Freq: Every day | SUBCUTANEOUS | 2 refills | Status: DC

## 2020-01-07 NOTE — Patient Instructions (Signed)
Samples:  3 boxed of U 200 Tresiba. TAKE 30 UNITS DAILY. This is concentrated insulin. Take 30 units, this is the same as 60 units of the U 100 Tresiba.   4 boxes of U100 Aspart. Take 20-28 units three times a day with meals. This will replace your humalog until you get your insurance back.

## 2020-01-07 NOTE — Progress Notes (Signed)
SUBJECTIVE:  CHIEF COMPLAINT / HPI:   Diabetes Follow up  Losartan - 4 days ago -- on [facility] discount  list ($9 for 30)  Freestyle - checking manually - yesterday was 64  Tresiba - out for a week - 5mL 100 ml/ml sol'n lasts 5 days.  Humalog U200 -- rationing 12 units twice a day. Now has 70 left.   CBG measured at 581 yesterday, then took 24 units novolog and down to 240.  Trying to stay hydrated. Having some nausea, but no vomiting.  No changes in mental status, confusion.  Hypertension Discussed at last visit. Per reivew of [facility] notes, patient was supposed to start coreg 6.125 mg BID after stress test in September, which she reports today that she did not start. She denies CP, SOB, visual changes. She has been out of losartan for four days. Does not check her BP at home.    PERTINENT  PMH / PSH: Poorly controlled DM II with retinopathy, nephropathy   OBJECTIVE:  BP (!) 152/92   Pulse 95   Ht 5\' 2"  (1.575 m)   Wt 263 lb 12.8 oz (119.7 kg)   LMP 01/03/2020 (Exact Date)   SpO2 98%   BMI 48.25 kg/m   General: Well appearing female, NAD  Diabetic Foot Exam - Simple   Simple Foot Form Diabetic Foot exam was performed with the following findings: Yes 01/07/2020  1:11 PM  Visual Inspection Sensation Testing Pulse Check Comments Negative sensation at site 9 bilaterally  Tuning fork exam not done today.  Patient follows with podiatry outpatient.     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  Out of Tresiba  x1 week.  Has been rationing her Humalog U200 for 12 units twice a day.  Has about 70 units left. A1c  --Provided 3 sample boxes of U200 Tresiba,  Patient to take 69 U200 tresiba  -- 4 sample boxes of U100 aspart (takes U 200 humalog 20-28 units). Patient to take 40-50 units TID WC. (Called  patient to confirm that she is taking U200 Humalog at home).  Patient to come back in 2 weeks for follow up if she needs further sample boxes. No concern for DKA symptoms at this time   -- Filled out signature waiver and release of records information forms and placed in [facility] pharmacy box today.  Staff message sent to [phone] for pharmaceutical assistance.  Patient will continue to work with Medicaid and her insurance but will certainly need help with medications while she is between insurance.   --Also discussed long-term plans for insulin pump.  Previously, discussed insulin pump with patient on multiple occasions.  Discussed that we will need to get her with adult endocrinology in order for insulin pump in the future.  We are still working with her to get her blood sugars under better control before she would be a candidate for insulin pump.  She is understanding.  Hypertension associated with stage 2 chronic kidney disease due to type 2 diabetes mellitus (HCC) Discussed at last visit. Per reivew of [facility] notes, patient was supposed to start coreg 6.125 mg BID after stress test in September, which she did not. She has been out of Losartan for four days. BP is elevated at 152/92 today. Given medication issues today, did not further address HTN. She denies  CP, SOB, visual changes. Patient would be a good candidate for chlorithalidone. Will defer this for now and recommended that patient discuss with Dr. Signe Colt at her next [facility] appt in January.  -- losartan on [facility] discount list ($9 for 30 day supply at [facility])  -- consider chlorithalidone  -- follow up with [facility] if patient to start coreg as previously planned  -- no changes in medication today     Melene Plan, MD [facility]

## 2020-01-07 NOTE — Assessment & Plan Note (Addendum)
Discussed at last visit. Per reivew of [facility] notes, patient was supposed to start coreg 6.125 mg BID after stress test in September, which she did not. She has been out of Losartan for four days. BP is elevated at 152/92 today. Given medication issues today, did not further address HTN. She denies CP, SOB, visual changes. Patient would be a good candidate for chlorithalidone. Will defer this for now and recommended that patient discuss with Dr. Signe Colt at her next [facility] appt in January.  -- losartan on [facility] discount list ($9 for 30 day supply at [facility])  -- consider chlorithalidone (CLICK trial) -- follow up with [facility] if patient to start coreg as previously planned  -- no changes in medication today

## 2020-01-07 NOTE — Assessment & Plan Note (Addendum)
Patient has been out of medications for about a week.  Out of Tresiba  x1 week.  Has been rationing her Humalog U200 for 12 units twice a day.  Has about 70 units left. A1c  --Provided 3 sample boxes of U200 Tresiba,  Patient to take 33 U200 tresiba  -- 4 sample boxes of U100 aspart (takes U 200 humalog 20-28 units). Patient to take 40-50 units TID WC. (Called patient to confirm that she is taking U200 Humalog at home).  Patient to come back in 2 weeks for follow up if she needs further sample boxes. No concern for DKA symptoms at this time   -- Filled out signature waiver and release of records information forms and placed in [facility] pharmacy box today.  Staff message sent to Mardee Postin for pharmaceutical assistance.  Patient will continue to work with Medicaid and her insurance but will certainly need help with medications while she is between insurance.   --Also discussed long-term plans for insulin pump.  Previously, discussed insulin pump with patient on multiple occasions.  Discussed that we will need to get her with adult endocrinology in order for insulin pump in the future.  We are still working with her to get her blood sugars under better control before she would be a candidate for insulin pump.  She is understanding.

## 2020-01-22 ENCOUNTER — Ambulatory Visit: Payer: Medicaid Other | Admitting: Family Medicine

## 2020-01-25 ENCOUNTER — Other Ambulatory Visit: Payer: Self-pay

## 2020-01-25 ENCOUNTER — Ambulatory Visit (INDEPENDENT_AMBULATORY_CARE_PROVIDER_SITE_OTHER): Payer: Medicaid Other | Admitting: Primary Care

## 2020-01-25 DIAGNOSIS — R768 Other specified abnormal immunological findings in serum: Secondary | ICD-10-CM

## 2020-01-25 MED ORDER — ADVAIR HFA 115-21 MCG/ACT IN AERO: 2.0000 | INHALATION_SPRAY | Freq: Two times a day (BID) | RESPIRATORY_TRACT | 3 refills | Status: DC

## 2020-01-25 NOTE — Progress Notes (Signed)
Virtual Visit via Telephone Note  I connected with Karina [name] on 01/25/20 at 11:30 AM EST by telephone and verified that I am speaking with the correct person using two identifiers.  Location: Patient: Home Provider: Office   I discussed the limitations, risks, security and privacy concerns of performing an evaluation and management service by telephone and the availability of in person appointments. I also discussed with the patient that there may be a patient responsible charge related to this service. The patient expressed understanding and agreed to proceed.   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   HPI: 35 year old female, never smoked. PMH significant UACS, GERD, hypertension, type 2 diabetes, hx covid-19, vit D deficiency, lower extremity swelling, obesity. Patient of Dr. Sherene Sires, last seen on 07/02/19.   12/25/2019 Patient presents today for acute visit. Reports 2 weeks of increased cough. Associated dyspnea, wheezing, fluid retention. Weight is up 13 lbs since September 2021. She last took lasix two days ago. She saw neurology for sleep consult, dx with OSA. Awaiting CPAP d/t Sport and exercise psychologist. She has started the process to potentially get bariatric surgery. She is compliant with pantoprazole 40mg  twice daily, GI took her off pepcid. She had a normal EGD on 12/14/19. Denies post nasal drip symptoms.   Dyspnea: With exertion and associated wheezing  Cough: Intermittent dry cough Nocturnal symptoms: Dx with sleep apnea , nocturnal cough  SABA: Rare   01/25/2020 Patient contacts today for 1 month follow-up televisit. Patient  had increased cough over 2 weeks with associated dyspnea on exertion and wheezing. During last visit ordered PFTs and gave patient trial Flovent 01/27/2020 2 puffs twice daily. We also added OTC antihistamine.  Patient states that flovent has helped with her cough and wheeze. Shortness of breath is similar to previous. Spirometry was normal in December 2021. There was some evidence of air trapping. Her PCP took her off lasix, she has not noticed a difference in breathing since stopping this. She had a CTA in September 2021 that showed mild central bronchial wall thickening with heterogeneous pulmonary parenchyma which can be seen with asthma or small airway disease.   Observations/Objective:  - Able to speaking in full sentences; no overt cough or wheezing  Assessment and Plan:  Asthma: - Cough and wheezing have improved on Flovent but continues to have moderate amount of dyspnea. An echocardiogram in July 2021 and BNP were normal.  - Spirometry in December 2021 was normal. There was some evidence of air trapping - Recommend changing Fluticasone (Flovent) to Fluticasone-salmeterol (Advair HFA)   Allergic rhinitis: - Continue Cetirizine daily over the counter  - Refer to allergist d/t elevated allergy markers , IgE >600   Follow Up Instructions:  - 6-8 week with Dr. January 2022    I discussed the assessment and treatment plan with the patient. The patient was provided an opportunity to ask questions and all were answered. The patient agreed with the plan and demonstrated an understanding of the instructions.   The patient was advised to call back or seek an in-person evaluation if the symptoms worsen or if the condition fails to  improve as anticipated.  I provided 22 minutes of non-face-to-face time during this encounter.   Martyn Ehrich, NP

## 2020-01-25 NOTE — Patient Instructions (Signed)
Stop Flovent Start Advair - take two puffs morning and evening every day Use albuterol rescue inhaler as needed for breakthrough shortness of breath  Refer to allergy d/t elevated allergy markers  Follow-up in 6-8 weeks with Dr. Sherene Sires

## 2020-01-30 DIAGNOSIS — Z79899 Other long term (current) drug therapy: Secondary | ICD-10-CM | POA: Diagnosis not present

## 2020-01-30 DIAGNOSIS — Z794 Long term (current) use of insulin: Secondary | ICD-10-CM | POA: Diagnosis not present

## 2020-01-30 DIAGNOSIS — H0288B Meibomian gland dysfunction left eye, upper and lower eyelids: Secondary | ICD-10-CM | POA: Diagnosis not present

## 2020-01-30 DIAGNOSIS — H0288A Meibomian gland dysfunction right eye, upper and lower eyelids: Secondary | ICD-10-CM | POA: Diagnosis not present

## 2020-01-30 DIAGNOSIS — E113522 Type 2 diabetes mellitus with proliferative diabetic retinopathy with traction retinal detachment involving the macula, left eye: Secondary | ICD-10-CM | POA: Diagnosis not present

## 2020-01-30 DIAGNOSIS — E113551 Type 2 diabetes mellitus with stable proliferative diabetic retinopathy, right eye: Secondary | ICD-10-CM | POA: Diagnosis not present

## 2020-02-01 ENCOUNTER — Telehealth: Payer: Self-pay | Admitting: Pharmacist

## 2020-02-01 ENCOUNTER — Encounter: Payer: Self-pay | Admitting: Family Medicine

## 2020-02-01 NOTE — Telephone Encounter (Signed)
Patient presents to clinic and provided with four boxes of Fiasp.   Veronda Prude, RN

## 2020-02-01 NOTE — Telephone Encounter (Signed)
Patient called reporting that her CBG this AM was 540's. She ran out of Novolog yesterday. She has been using 40-42 units TID (she has not been taking her snack time dose as discussed at previous visit as she was afraid she was going to run out).   I have been following with Ms. Durfey closely in the last few months to help optimize her poorly controlled diabetes with multiple complications. I will be leaving for maternity shortly and patient is agreeable to continue following closely with Dr. Raymondo Band (has previously followed patient) during my absence.   Our current goals are to get her blood sugars under ctonrol, which have been much improved, but now affected by changes in her insurance. Additionally, we have also been working on blood pressures.   Ideally, patient can follow up next week given recent missed appointment to follow up on blood pressure and diabetes. I have been following with patient q 2-4 weeks.   Melene Plan, M.D.  2:17 PM 02/01/2020

## 2020-02-01 NOTE — Telephone Encounter (Addendum)
Attempted to call patient to discuss providing Novolog samples. Patient did not answer and left HIPAA compliant voicemail requesting call back.  Currently samples are being housed off-site and will have to be brought over therefore was trying to speak with patient about coming later this afternoon to give clinic time to acquire them.  Patient to be provided 4 boxes of Novolog FlexPen per previous visit with Dr. Selena Batten from 01/07/20 in which she was provided the same sample to substitute her Humalog mealtime insulin for the time being.

## 2020-02-01 NOTE — Telephone Encounter (Signed)
Spoke to patient on phone regarding supplying samples of her insulin therapy.  Current regimen: Insulin aspart (Fiasp) 40-44 units with meals Insulin degludec Evaristo Bury) 30 units at bedtime  Instructed patient goal is to bring up her basal insulin dose Evaristo Bury) as her current regimen is 18.5% basal and 81.5% bolus. We will begin slowly titrating basal for now ensuring patient does not experience any hypoglycemic events.   Typically she states her fasting blood glucose is in the high 100's and her post-prandial blood glucose is in the 200's.  Instructed patient over phone to increase Tresiba from 30 to 36 units daily and if she begins to experience hypoglycemic events to reduce her dose of Tresiba by 2 units.   Pharmacy team will follow-up with patient over the phone on Monday 02/04/20. Patient will need close follow-up for further insulin titration.  Medication Samples have been left in sample fridge for patient.  Drug name: Candie Mile     Strength: 100units/mL        Qty: 4 pens LOT: [id number]  Exp.Date: 09/10/2021  Dosing instructions: Inject 40-42 units into the skin with meals  The patient has been instructed regarding the correct time, dose, and frequency of taking this medication, including desired effects and most common side effects.   Cheral Almas 3:59 PM 02/01/2020

## 2020-02-04 ENCOUNTER — Telehealth: Payer: Self-pay | Admitting: Pharmacist

## 2020-02-04 NOTE — Telephone Encounter (Signed)
Called patient to follow-up regarding diabetes management and insulin titration.  Per patient current regimen: Insulin degludec Karina [name]) 42-44 units at night Insulin aspart (Fiasp) 30-34 units with meals  Over the weekend she woke up early Saturday morning feeling shaky and checked her blood sugar and it was 85. She drank 4 oz of orange juice and felt better. On Sunday morning her fasting blood sugar was 120. She states she has noticed an improvement in her blood sugars since initiating the above regimen although different than the plan provided on Friday. She had one post-prandial blood sugar of 315, only one in 300's, and attributes to having noodles, an apple, and 1/2 sweet 1/2 unsweet tea.  Discussed with patient to titrate [place] down to 40 units over next two days until appointment on 02/06/20 due to symptomatic hypoglycemic event. Instructed if over next two days her fasting is >200 to then titrate back up 2 units to 42 units once daily.  Pharmacy team will continue to follow patient closely for diabetes management. Patient will need frequent follow-up for insulin titrations until her blood sugars are more consistently at goal.

## 2020-02-05 NOTE — Progress Notes (Signed)
    SUBJECTIVE:   CHIEF COMPLAINT / HPI:   T2DM  Current regimen: Tresiba 40 units nightly, insulin aspart 30 to 34 units with meals She has been following with pharmacy and they last spoke with her on 1/24 when she noted to be having hypoglycemic symptoms, for this reason Evaristo Bury was decreased from 42 units to 40 units CBGs  CBG at 0100 this AM was 80 and she was jittery with thism had some grapes and trail mix and it improved, then 140 in the AM Last A1c on 12/27 was 11.5 On losartan Last BMP on 12/14, creatinine 1.79  Has one box of fiasp and no more tresiba Has trouble affording insulin  Has medicaid insurance  Needs Vit D Refill  PERTINENT  PMH / PSH: Hypertension with CKD stage II, GERD, T2DM, proliferative diabetic retinopathy OU, morbid obesity, depression  OBJECTIVE:   BP (!) 152/82   Pulse 94   Ht 5\' 2"  (1.575 m)   Wt 262 lb (118.8 kg)   SpO2 99%   BMI 47.92 kg/m    Physical Exam:  General: 35 y.o. 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  She has been having some lows although her CBGs have been within normal range, these are just lower than she is used to.  We will go ahead and keep her Tresiba 40 units nightly and decrease her mealtime insulin from 30 to 34 units with meals to 28 to 30 units.  She was provided samples of Tresiba and 5 asked to get her through 1 month.  Pharmacy plans to call her within the next week to check in and continue to monitor her sugars.   She will follow-up with this provider in 1 month as her PCP will be on maternity leave.  At that point, could consider making some adjustments to her blood pressure regimen as her BP was slightly elevated today, but for now we will continue to monitor.  Cost has been a large issue and unsure of the reason as it appears patient has Medicaid.  CCM referral placed for medication assistance.  Vitamin D deficiency Refill provided for her daily vitamin D.     Cheral Almas, DO [facility]

## 2020-02-06 ENCOUNTER — Ambulatory Visit (INDEPENDENT_AMBULATORY_CARE_PROVIDER_SITE_OTHER): Payer: Medicaid Other | Admitting: Family Medicine

## 2020-02-06 ENCOUNTER — Other Ambulatory Visit: Payer: Self-pay

## 2020-02-06 ENCOUNTER — Encounter: Payer: Self-pay | Admitting: Family Medicine

## 2020-02-06 VITALS — BP 152/82 | HR 94 | Ht 62.0 in | Wt 262.0 lb

## 2020-02-06 DIAGNOSIS — N189 Chronic kidney disease, unspecified: Secondary | ICD-10-CM | POA: Diagnosis not present

## 2020-02-06 DIAGNOSIS — Z794 Long term (current) use of insulin: Secondary | ICD-10-CM | POA: Diagnosis not present

## 2020-02-06 DIAGNOSIS — E559 Vitamin D deficiency, unspecified: Secondary | ICD-10-CM | POA: Diagnosis not present

## 2020-02-06 DIAGNOSIS — I129 Hypertensive chronic kidney disease with stage 1 through stage 4 chronic kidney disease, or unspecified chronic kidney disease: Secondary | ICD-10-CM | POA: Diagnosis not present

## 2020-02-06 DIAGNOSIS — Z136 Encounter for screening for cardiovascular disorders: Secondary | ICD-10-CM | POA: Diagnosis not present

## 2020-02-06 DIAGNOSIS — Z6841 Body Mass Index (BMI) 40.0 and over, adult: Secondary | ICD-10-CM | POA: Diagnosis not present

## 2020-02-06 DIAGNOSIS — N1831 Chronic kidney disease, stage 3a: Secondary | ICD-10-CM | POA: Diagnosis not present

## 2020-02-06 DIAGNOSIS — E1165 Type 2 diabetes mellitus with hyperglycemia: Secondary | ICD-10-CM | POA: Diagnosis not present

## 2020-02-06 DIAGNOSIS — G4733 Obstructive sleep apnea (adult) (pediatric): Secondary | ICD-10-CM | POA: Diagnosis not present

## 2020-02-06 DIAGNOSIS — E1122 Type 2 diabetes mellitus with diabetic chronic kidney disease: Secondary | ICD-10-CM | POA: Diagnosis not present

## 2020-02-06 DIAGNOSIS — E113313 Type 2 diabetes mellitus with moderate nonproliferative diabetic retinopathy with macular edema, bilateral: Secondary | ICD-10-CM

## 2020-02-06 DIAGNOSIS — D631 Anemia in chronic kidney disease: Secondary | ICD-10-CM | POA: Diagnosis not present

## 2020-02-06 MED ORDER — VITAMIN D3 50 MCG (2000 UT) PO CHEW: 2000.0000 [IU] | CHEWABLE_TABLET | Freq: Every day | ORAL | 3 refills | Status: DC

## 2020-02-06 NOTE — Assessment & Plan Note (Addendum)
Discussed with Cheral Almas, pharmacist who has been helping patient transition to lower mealtime insulins and higher basal dose as she has not had good control and has been on very high doses of mealtime insulin but not eating very carb heavy meals.  She has been having some lows although her CBGs have been within normal range, these are just lower than she is used to.  We will go ahead and keep her Tresiba 40 units nightly and decrease her mealtime insulin from 30 to 34 units with meals to 28 to 30 units.  She was provided samples of Tresiba and 5 asked to get her through 1 month.  Pharmacy plans to call her within the next week to check in and continue to monitor her sugars.  She will follow-up with this provider in 1 month as her PCP will be on maternity leave.  At that point, could consider making some adjustments to her blood pressure regimen as her BP was slightly elevated today, but for now we will continue to monitor.  Cost has been a large issue and unsure of the reason as it appears patient has Medicaid.  CCM referral placed for medication assistance.

## 2020-02-06 NOTE — Assessment & Plan Note (Signed)
Refill provided for her daily vitamin D.

## 2020-02-06 NOTE — Progress Notes (Signed)
Medication Samples have been provided to the patient by Dr. Obie Dredge  Drug name: insulin degludec Evaristo Bury)      Strength: 200 units/mL       Qty: 1 pen LOT: [id number]  Exp.Date: 05/10/20  Dosing instructions: inject 40 units into the skin daily  Drug name: insulin aspart (Fiasp)     Strength: 100 units/mL       Qty: 9 pens   LOT: [id number]  Exp.Date: 09/10/21  Dosing instructions: inject 28-30 units into the skin with meals   Cheral Almas 12:15 PM 02/06/2020

## 2020-02-06 NOTE — Patient Instructions (Signed)
Thank you for coming to see me today. It was a pleasure. Today we talked about:   Decrease your mealtime insulin to 28-30 units.  Keep your Tresiba at 40 units nightly.  Keep a log of your sugars.  Our pharmacist will be in touch as will our team that helps with medication costs.  Please follow-up with me in 1 month.  If you have any questions or concerns, please do not hesitate to call the office at [phone].  Best,   Luis Abed, DO

## 2020-02-11 ENCOUNTER — Other Ambulatory Visit: Payer: Self-pay | Admitting: *Deleted

## 2020-02-11 ENCOUNTER — Other Ambulatory Visit: Payer: Self-pay | Admitting: Family Medicine

## 2020-02-11 ENCOUNTER — Other Ambulatory Visit: Payer: Self-pay

## 2020-02-11 DIAGNOSIS — Z794 Long term (current) use of insulin: Secondary | ICD-10-CM

## 2020-02-11 DIAGNOSIS — Z5941 Food insecurity: Secondary | ICD-10-CM

## 2020-02-11 DIAGNOSIS — E113313 Type 2 diabetes mellitus with moderate nonproliferative diabetic retinopathy with macular edema, bilateral: Secondary | ICD-10-CM

## 2020-02-11 NOTE — Patient Instructions (Signed)
Visit Information  Ms. Guallpa was given information about Medicaid Managed Care team care coordination services as a part of their Healthy [facility] Medicaid benefit. Prisma P Rosier verbally consented to engagement with the [facility] Managed Care team.   For questions related to your Healthy [facility] health plan, please call: [phone] or visit the homepage here: [email or web address]  If you would like to schedule transportation through your Healthy [facility] plan, please call the following number at least 2 days in advance of your appointment: [phone]  Ms. Levi - following are the goals we discussed in your visit today:  Goals Addressed            This Visit's Progress   . Monitor and Manage My Blood Sugar-Diabetes Type 2       Timeframe:  Long-Range Goal Priority:  High Start Date:  02/11/20                           Expected End Date:  04/12/20                     Follow Up Date    - check blood sugar at prescribed times - check blood sugar if I feel it is too high or too low - enter blood sugar readings and medication or insulin into daily log - take the blood sugar log to all doctor visits - take the blood sugar meter to all doctor visits    Why is this important?    Checking your blood sugar at home helps to keep it from getting very high or very low.   Writing the results in a diary or log helps the doctor know how to care for you.   Your blood sugar log should have the time, date and the results.   Also, write down the amount of insulin or other medicine that you take.   Other information, like what you ate, exercise done and how you were feeling, will also be helpful.          . Obtain Eye Exam-Diabetes Type 2       Follow Up Date 03/12/20    - schedule appointment with eye doctor    Why is this important?    Eye check-ups are important when you have diabetes.   Vision loss can be prevented.         . Set My  Target A1C-Diabetes Type 2       Timeframe:  Long-Range Goal Priority:  Medium Start Date:  02/11/20                           Expected End Date:  03/12/20                     Follow Up Date 03/12/20   - set target A1C    Why is this important?    Your target A1C is decided together by you and your doctor.   It is based on several things like your age and other health issues.           Please see education materials related to diabetes and diet provided by MyChart link.    Diabetes Mellitus and Nutrition, Adult When you have diabetes, or diabetes mellitus, it is very important to have healthy eating habits because your blood sugar (glucose) levels are greatly affected by what you eat  and drink. Eating healthy foods in the right amounts, at about the same times every day, can help you:  Control your blood glucose.  Lower your risk of heart disease.  Improve your blood pressure.  Reach or maintain a healthy weight. What can affect my meal plan? Every person with diabetes is different, and each person has different needs for a meal plan. Your health care provider may recommend that you work with a dietitian to make a meal plan that is best for you. Your meal plan may vary depending on factors such as:  The calories you need.  The medicines you take.  Your weight.  Your blood glucose, blood pressure, and cholesterol levels.  Your activity level.  Other health conditions you have, such as heart or kidney disease. How do carbohydrates affect me? Carbohydrates, also called carbs, affect your blood glucose level more than any other type of food. Eating carbs naturally raises the amount of glucose in your blood. Carb counting is a method for keeping track of how many carbs you eat. Counting carbs is important to keep your blood glucose at a healthy level, especially if you use insulin or take certain oral diabetes medicines. It is important to know how many carbs you can safely  have in each meal. This is different for every person. Your dietitian can help you calculate how many carbs you should have at each meal and for each snack. How does alcohol affect me? Alcohol can cause a sudden decrease in blood glucose (hypoglycemia), especially if you use insulin or take certain oral diabetes medicines. Hypoglycemia can be a life-threatening condition. Symptoms of hypoglycemia, such as sleepiness, dizziness, and confusion, are similar to symptoms of having too much alcohol.  Do not drink alcohol if: ? Your health care provider tells you not to drink. ? You are pregnant, may be pregnant, or are planning to become pregnant.  If you drink alcohol: ? Do not drink on an empty stomach. ? Limit how much you use to:  0-1 drink a day for women.  0-2 drinks a day for men. ? Be aware of how much alcohol is in your drink. In the [place] one drink equals one 12 oz bottle of beer (355 mL), one 5 oz glass of wine (148 mL), or one 1 oz glass of hard liquor (44 mL). ? Keep yourself hydrated with water, diet soda, or unsweetened iced tea.  Keep in mind that regular soda, juice, and other mixers may contain a lot of sugar and must be counted as carbs. What are tips for following this plan? Reading food labels  Start by checking the serving size on the "Nutrition Facts" label of packaged foods and drinks. The amount of calories, carbs, fats, and other nutrients listed on the label is based on one serving of the item. Many items contain more than one serving per package.  Check the total grams (g) of carbs in one serving. You can calculate the number of servings of carbs in one serving by dividing the total carbs by 15. For example, if a food has 30 g of total carbs per serving, it would be equal to 2 servings of carbs.  Check the number of grams (g) of saturated fats and trans fats in one serving. Choose foods that have a low amount or none of these fats.  Check the number of milligrams  (mg) of salt (sodium) in one serving. Most people should limit total sodium intake to less than 2,300 mg  per day.  Always check the nutrition information of foods labeled as "low-fat" or "nonfat." These foods may be higher in added sugar or refined carbs and should be avoided.  Talk to your dietitian to identify your daily goals for nutrients listed on the label. Shopping  Avoid buying canned, pre-made, or processed foods. These foods tend to be high in fat, sodium, and added sugar.  Shop around the outside edge of the grocery store. This is where you will most often find fresh fruits and vegetables, bulk grains, fresh meats, and fresh dairy. Cooking  Use low-heat cooking methods, such as baking, instead of high-heat cooking methods like deep frying.  Cook using healthy oils, such as olive, canola, or sunflower oil.  Avoid cooking with butter, cream, or high-fat meats. Meal planning  Eat meals and snacks regularly, preferably at the same times every day. Avoid going long periods of time without eating.  Eat foods that are high in fiber, such as fresh fruits, vegetables, beans, and whole grains. Talk with your dietitian about how many servings of carbs you can eat at each meal.  Eat 4-6 oz (112-168 g) of lean protein each day, such as lean meat, chicken, fish, eggs, or tofu. One ounce (oz) of lean protein is equal to: ? 1 oz (28 g) of meat, chicken, or fish. ? 1 egg. ?  cup (62 g) of tofu.  Eat some foods each day that contain healthy fats, such as avocado, nuts, seeds, and fish.   What foods should I eat? Fruits Berries. Apples. Oranges. Peaches. Apricots. Plums. Grapes. Mango. Papaya. Pomegranate. Kiwi. Cherries. Vegetables Lettuce. Spinach. Leafy greens, including kale, chard, collard greens, and mustard greens. Beets. Cauliflower. Cabbage. Broccoli. Carrots. Green beans. Tomatoes. Peppers. Onions. Cucumbers. Brussels sprouts. Grains Whole grains, such as whole-wheat or  whole-grain bread, crackers, tortillas, cereal, and pasta. Unsweetened oatmeal. Quinoa. Brown or wild rice. Meats and other proteins Seafood. Poultry without skin. Lean cuts of poultry and beef. Tofu. Nuts. Seeds. Dairy Low-fat or fat-free dairy products such as milk, yogurt, and cheese. The items listed above may not be a complete list of foods and beverages you can eat. Contact a dietitian for more information. What foods should I avoid? Fruits Fruits canned with syrup. Vegetables Canned vegetables. Frozen vegetables with butter or cream sauce. Grains Refined white flour and flour products such as bread, pasta, snack foods, and cereals. Avoid all processed foods. Meats and other proteins Fatty cuts of meat. Poultry with skin. Breaded or fried meats. Processed meat. Avoid saturated fats. Dairy Full-fat yogurt, cheese, or milk. Beverages Sweetened drinks, such as soda or iced tea. The items listed above may not be a complete list of foods and beverages you should avoid. Contact a dietitian for more information. Questions to ask a health care provider  Do I need to meet with a diabetes educator?  Do I need to meet with a dietitian?  What number can I call if I have questions?  When are the best times to check my blood glucose? Where to find more information:  American Diabetes Association: diabetes.org  Academy of Nutrition and Dietetics: www.eatright.[employer] of Diabetes and Digestive and Kidney Diseases: CarFlippers.tn  Association of Diabetes Care and Education Specialists: www.diabeteseducator.org Summary  It is important to have healthy eating habits because your blood sugar (glucose) levels are greatly affected by what you eat and drink.  A healthy meal plan will help you control your blood glucose and maintain a healthy lifestyle.  Your health care provider may recommend that you work with a dietitian to make a meal plan that is best for  you.  Keep in mind that carbohydrates (carbs) and alcohol have immediate effects on your blood glucose levels. It is important to count carbs and to use alcohol carefully. This information is not intended to replace advice given to you by your health care provider. Make sure you discuss any questions you have with your health care provider. Document Revised: 12/05/2018 Document Reviewed: 12/05/2018 Elsevier Patient Education  2021 Elsevier Inc.   Patient verbalizes understanding of instructions provided today.   Telephone follow up appointment with Managed Medicaid care management team member scheduled for:03/12/20 @ 2:30p  Heidi Dach, RN  Following is a copy of your plan of care:  Patient Care Plan: Diabetes Type 2 (Adult)    Problem Identified: Glycemic Management (Diabetes, Type 2)     Long-Range Goal: Glycemic Management Optimized   Start Date: 02/11/2020  Expected End Date: 04/14/2020  This Visit's Progress: On track  Priority: High  Note:   CARE PLAN ENTRY Medicaid Managed Care (see longtitudinal plan of care for additional care plan information)  Objective:  Lab Results  Component Value Date   HGBA1C 11.5 (A) 01/07/2020 .   Lab Results  Component Value Date   CREATININE 1.79 (H) 12/25/2019   CREATININE 1.69 (H) 10/27/2019   CREATININE 1.59 (H) 10/10/2019   . Patient reported cbg findings: 120-140 before meals and around 300 before bed   Current Barriers:  [name] Knowledge Deficits related to basic Diabetes pathophysiology and self care/management-Ms Woolman was diagnosed with DMII in 2004. She reports that in the last 3 weeks she has really tried to change the way she eats. She has not had a diabetic education class in a long time and is interested in a referral. Patient reports checking her blood sugar before meals and at bedtime with readings being 120-140 preprandial and around 300 at bedtime. She recently had some confusion with her insurance status, this has been  corrected, now her copay for medication is $3. She is needing an optometrist appointment for a vision exam. . Financial Constraints  Case Manager Clinical Goal(s):  [name] Over the next 30 days, patient will demonstrate improved adherence to prescribed treatment plan for diabetes self care/management as evidenced by:  . daily monitoring and recording of CBG . adherence to ADA/ carb modified diet . exercise 5 days/week . Schedule an appointment for vision exam . Attend a diabetic education class  Interventions:  . Provided education to patient about basic DM disease process and diet . Reviewed medications with patient and discussed importance of medication adherence . Discussed plans with patient for ongoing care management follow up and provided patient with direct contact information for care management team . Provided patient with written educational materials related to hypo and hyperglycemia and importance of correct treatment . Reviewed scheduled/upcoming provider appointments  . Referral made to pharmacy team for assistance with medication management . Referral made to community resources care guide team for assistance with food insecurity . Review of patient status, including review of consultants reports, relevant laboratory and other test results, and medications completed. Steele Sizer with PCP for referral to diabetic education . Explained to patient to check her blood sugar before eating a snack at bedtime, if reading is 140 or less-eat a protein rich snack . Provided patient with [employer] transportation number [phone])  . Provided patient with information on [facility] locations that accept  Medicaid ([facility] and [facility])  Patient Self Care Activities:  - check blood sugar at prescribed times - check blood sugar if I feel it is too high or too low - enter blood sugar readings and medication or insulin into daily log - take the blood sugar log to all doctor  visits - take the blood sugar meter to all doctor visits  - schedule appointment with eye doctor  - set target A1C    Initial goal documentation

## 2020-02-11 NOTE — Patient Outreach (Signed)
Medicaid Managed Care   Nurse Care Manager Note  02/11/2020 Name:  Karina [name] MRN:  [id number] DOB:  [birth year]  Karina [name] is an 35 y.o. year old female who is a primary patient of Maudie Mercury, Charlyne Quale, MD.  The [facility] Managed Care Coordination team was consulted for assistance with:    DMII  Ms. Colan was given information about Medicaid Managed Care Coordination team services today. Karina [name] agreed to services and verbal consent obtained.  Engaged with patient by telephone for initial visit in response to provider referral for case management and/or care coordination services.   Assessments/Interventions:  Review of past medical history, allergies, medications, health status, including review of consultants reports, laboratory and other test data, was performed as part of comprehensive evaluation and provision of chronic care management services.  SDOH (Social Determinants of Health) assessments and interventions performed:   Care Plan  Allergies  Allergen Reactions  . Lisinopril Cough  . Eggs Or Egg-Derived Products Nausea And Vomiting  . Contrast Media [Iodinated Diagnostic Agents]     Medications Reviewed Today    Reviewed by Karina Montane, RN (Registered Nurse) on 02/11/20 at 1635  Med List Status: <None>  Medication Order Taking? Sig Documenting Provider Last Dose Status Informant  albuterol (VENTOLIN HFA) 108 (90 Base) MCG/ACT inhaler [id number] Yes Inhale 1-2 puffs into the lungs every 4 (four) hours as needed for shortness of breath. [provider] Taking Active Self  benzonatate (TESSALON) 200 MG capsule [id number] Yes Take three times a day until no longer coughing, then take as needed Martyn Ehrich, NP Taking Active   Blood Pressure Monitoring (BLOOD PRESSURE MONITOR AUTOMAT) DEVI [id number] No 1 kit by Does not apply route daily. Please call the office if you have any issues obtaining this monitor.  Patient not taking: Reported on 02/11/2020    Wilber Oliphant, MD Not Taking Active Self  carvedilol (COREG) 6.25 MG tablet [id number] Yes Take 6.25 mg by mouth 2 (two) times daily with a meal. [provider] Taking Active   Cholecalciferol (VITAMIN D3) 50 MCG (2000 UT) CHEW [id number] No Chew 2,000 Units by mouth daily.  Patient not taking: Reported on 02/11/2020   Meccariello, Bernita Raisin, DO Not Taking Active   Continuous Blood Gluc Receiver (FREESTYLE LIBRE 2 READER) DEVI [id number] No 1 Device by Does not apply route in the morning, at noon, in the evening, and at bedtime.  Patient not taking: Reported on 02/11/2020   Wilber Oliphant, MD Not Taking Active   Continuous Blood Gluc Sensor (FREESTYLE [place] 2 SENSOR) [place] [id number] No USE AS DIRECTED MORNING,  NOON,  EVENING,  AND  BEDTIME  Patient not taking: Reported on 02/11/2020   Wilber Oliphant, MD Not Taking Active   fluticasone-salmeterol (ADVAIR [facility]) 423-53 MCG/ACT inhaler [id number] Yes Inhale 2 puffs into the lungs 2 (two) times daily. Martyn Ehrich, NP Taking Active   furosemide (LASIX) 20 MG tablet [id number]  Take 1 tablet (20 mg total) by mouth daily. Elouise Munroe, MD  Expired 12/14/19 2359 Self  furosemide (LASIX) 40 MG tablet [id number] No Take 40 mg by mouth.  Patient not taking: No sig reported   [provider] Not Taking Active         Discontinued 01/31/19 1448   hydroxypropyl methylcellulose / hypromellose (ISOPTO TEARS / GONIOVISC) 2.5 % ophthalmic solution [id number] Yes Place 1 drop into both eyes as needed for dry eyes. [provider] Taking Active  Insulin Aspart FlexPen 100 UNIT/ML SOPN [id number] Yes Inject 20-28 Units into the skin 3 (three) times daily with meals. Wilber Oliphant, MD Taking Active            Med Note [facility], RACHELLE   Mon Feb 04, 2020 11:46 AM) Currently 30-34 units with meals  insulin degludec (TRESIBA FLEXTOUCH) 200 UNIT/ML FlexTouch Pen [id number] Yes Inject 30 Units into the skin daily. Wilber Oliphant, MD Taking  Active            Med Note [facility], RACHELLE   Mon Feb 04, 2020 11:46 AM) Currently 40 units  Insulin Degludec (TRESIBA) 100 UNIT/ML SOLN [id number] No Inject 60 Units into the skin daily.   Patient not taking: Reported on 02/11/2020   [provider] Not Taking Active Self  insulin lispro (HUMALOG KWIKPEN) 200 UNIT/ML KwikPen [id number] No INJECT 20 TO 28 UNITS SUBCUTANEOUSLY THREE TIMES DAILY  Patient not taking: Reported on 02/11/2020   Wilber Oliphant, MD Not Taking Active   Insulin Pen Needle (B-D ULTRAFINE III SHORT PEN) 31G X 8 MM MISC [id number] Yes 1 Container by Does not apply route as needed. Wilber Oliphant, MD Taking Active Self  losartan (COZAAR) 100 MG tablet [id number] Yes Take 100 mg by mouth daily. [provider] Taking Active   pantoprazole (PROTONIX) 40 MG tablet [id number]  Take 1 tablet (40 mg total) by mouth daily. Irene Shipper, MD  Active            Med Note Thamas Jaegers, Beryl Meager Feb 11, 2020  3:16 PM) Taking differently-taking twice daily  Med List Note Silverio Lay 11/16/11 9628):               Patient Active Problem List   Diagnosis Date Noted  . Vitamin D deficiency 09/04/2019  . Stable proliferative diabetic retinopathy of both eyes associated with type 2 diabetes mellitus ([place]) 08/17/2019  . Current mild episode of major depressive disorder ([place]) 08/08/2019  . Sleep-related headache 07/05/2019  . 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 Cephalalgia 07/05/2019  . Proliferative diabetic retinopathy of right eye ([place]) 06/20/2019  . Proliferative diabetic retinopathy of left eye ([place]) 06/20/2019  . Vitreous hemorrhage of left eye ([place]) 06/20/2019  . DOE (dyspnea on exertion) 05/21/2019  . Tachycardia 05/21/2019  . History of  COVID-19 03/22/2019  . Upper airway cough syndrome 03/22/2019  . Shortness of breath 03/22/2019  . Carpal tunnel syndrome of right wrist 03/13/2019  . Gastroesophageal reflux disease 03/23/2018  . Lower extremity edema 08/29/2017  . Hypertension associated with stage 2 chronic kidney disease due to type 2 diabetes mellitus ([place]) 12/12/2013  . Morbid obesity ([place]) 05/25/2013  . Type 2 diabetes mellitus with both eyes affected by moderate nonproliferative retinopathy and macular edema, with long-term current use of insulin ([place]) 12/27/2012  . Depression 12/27/2012    Conditions to be addressed/monitored per PCP order:  DMII  Care Plan : Diabetes Type 2 (Adult)  Updates made by Karina Montane, RN since 02/11/2020 12:00 AM    Problem: Glycemic Management (Diabetes, Type 2)     Long-Range Goal: Glycemic Management Optimized   Start Date: 02/11/2020  Expected End Date: 04/14/2020  This Visit's Progress: On track  Priority: High  Note:   CARE PLAN ENTRY Medicaid Managed  Care (see longtitudinal plan of care for additional care plan information)  Objective:  Lab Results  Component Value Date   HGBA1C 11.5 (A) 01/07/2020 .   Lab Results  Component Value Date   CREATININE 1.79 (H) 12/25/2019   CREATININE 1.69 (H) 10/27/2019   CREATININE 1.59 (H) 10/10/2019   . Patient reported cbg findings: 120-140 before meals and around 300 before bed   Current Barriers:  [name] Knowledge Deficits related to basic Diabetes pathophysiology and self care/management-Ms Greenbaum was diagnosed with DMII in 2004. She reports that in the last 3 weeks she has really tried to change the way she eats. She has not had a diabetic education class in a long time and is interested in a referral. Patient reports checking her blood sugar before meals and at bedtime with readings being 120-140 preprandial and around 300 at bedtime. She recently had some confusion with her insurance status, this has been corrected, now her copay  for medication is $3. She is needing an optometrist appointment for a vision exam. . Financial Constraints  Case Manager Clinical Goal(s):  [name] Over the next 30 days, patient will demonstrate improved adherence to prescribed treatment plan for diabetes self care/management as evidenced by:  . daily monitoring and recording of CBG . adherence to ADA/ carb modified diet . exercise 5 days/week . Schedule an appointment for vision exam . Attend a diabetic education class  Interventions:  . Provided education to patient about basic DM disease process and diet . Reviewed medications with patient and discussed importance of medication adherence . Discussed plans with patient for ongoing care management follow up and provided patient with direct contact information for care management team . Provided patient with written educational materials related to hypo and hyperglycemia and importance of correct treatment . Reviewed scheduled/upcoming provider appointments  . Referral made to pharmacy team for assistance with medication management . Referral made to community resources care guide team for assistance with food insecurity . Review of patient status, including review of consultants reports, relevant laboratory and other test results, and medications completed. Nash Dimmer with PCP for referral to diabetic education . Explained to patient to check her blood sugar before eating a snack at bedtime, if reading is 140 or less-eat a protein rich snack . Provided patient with [employer] transportation number [phone])  . Provided patient with information on [facility] locations that accept Medicaid ([place] and [place])  Patient Self Care Activities:  - check blood sugar at prescribed times - check blood sugar if I feel it is too high or too low - enter blood sugar readings and medication or insulin into daily log - take the blood sugar log to all doctor visits - take the blood  sugar meter to all doctor visits  - schedule appointment with eye doctor  - set target A1C    Initial goal documentation      Follow Up:  Patient agrees to Care Plan and Follow-up.  Plan: The Managed Medicaid care management team will reach out to the patient again over the next 30 days.  Date/time of next scheduled RN care management/care coordination outreach: 03/12/20 @ 2:30p  Karina Joiner RN, [place] RN Care Coordinator

## 2020-02-11 NOTE — Progress Notes (Signed)
Requesting DM education referral.

## 2020-02-12 DIAGNOSIS — F4325 Adjustment disorder with mixed disturbance of emotions and conduct: Secondary | ICD-10-CM | POA: Diagnosis not present

## 2020-02-13 ENCOUNTER — Telehealth: Payer: Self-pay | Admitting: Family Medicine

## 2020-02-13 NOTE — Telephone Encounter (Signed)
I attempted to reach Karina [name] today to get her scheduled for a phone visit with the Managed Medicaid Pharmacist. I was not able to leave a message due to her VM box being full. I will make another attempt to reach her in the next 7-14 days.

## 2020-02-14 ENCOUNTER — Telehealth: Payer: Self-pay | Admitting: Family Medicine

## 2020-02-14 NOTE — Telephone Encounter (Signed)
   Telephone encounter was:  Successful.  02/14/2020 Name: SHILYNN HOCH MRN: [id number] DOB: 06-08-1985  Johnella Moloney is a 35 y.o. year old female who is a primary care patient of Selena Batten, Vinnie Langton, MD . The community resource team was consulted for assistance with Food Insecurity  Care guide performed the following interventions: Patient provided with information about care guide support team and interviewed to confirm resource needs Investigation of community resources performed Discussed resources to assist with food resources in her area.  I spoke to pt and she gets a little over $400 in food stamps each month. We discussed her utilizing local food banks in her area, so CG will send her information via email on listings for food banks in her area. .  Follow Up Plan:  Care Guide will email patient list of food banks. There are no other resources that the patient needs at this time.   Rojelio Brenner Care Guide, Embedded Care Coordination [facility], Care Management Phone: [phone] Email: [email or web address]

## 2020-02-15 ENCOUNTER — Telehealth: Payer: Self-pay | Admitting: Family Medicine

## 2020-02-15 ENCOUNTER — Telehealth: Payer: Self-pay | Admitting: Pharmacist

## 2020-02-15 NOTE — Telephone Encounter (Signed)
Spoke to patient on phone and currently insulin regimen: Insulin aspart (Fiasp) 30-34 units with meals Insulin degludec Evaristo Bury) 40-42 units once daily  She reports blood glucose readings typically in the 100-200's with no hypoglycemic events. She states that on occasion her blood glucose between dinner and bedtime will be in the 300's and she will inject 22 additional units of Fiasp. This has not caused her to have a hypoglycemic event thus far.   Instructed patient to take 40 units of Tresiba and not to use a sliding scale when administering her basal insulin. In addition, instructed her to begin testing her blood glucose before meals and to keep utilizing the same mealtime sliding scale of Fiasp. Told her not to take Candie Mile unless eating a meal.Told patient blood glucose in the 300's after meals could be from diet but also could be from blood glucose being highly elevated before meals and her not being aware d/t not checking. Discussed that having pre meal blood glucose readings will help establish patterns. Patient understood all instructions and verbalized back. Will call on Monday to follow-up from weekend readings.

## 2020-02-15 NOTE — Telephone Encounter (Signed)
Attempted to speak with patient regarding current insulin therapy and blood glucose readings. Unable to leave voicemail d/t box being full. Will attempt to call patient back later today.

## 2020-02-15 NOTE — Telephone Encounter (Signed)
° °  Telephone encounter was:  Successful.  02/15/2020 Name: Karina [name] MRN: [id number] DOB: 04/25/1985  Karina [name] is a 35 y.o. year old female who is a primary care patient of Selena Batten, Vinnie Langton, MD . The community resource team was consulted for assistance with email for food resources.  Care guide performed the following interventions: Patient provided with information about care guide support team and interviewed to confirm resource needs Investigation of community resources performed.  Follow Up Plan:  No further follow up planned at this time. The patient has been provided with needed resources.  Rojelio Brenner Care Guide, Embedded Care Coordination [facility], Care Management Phone: [phone] Email: [email or web address]

## 2020-02-18 ENCOUNTER — Telehealth: Payer: Self-pay | Admitting: Pharmacist

## 2020-02-18 ENCOUNTER — Other Ambulatory Visit: Payer: Self-pay | Admitting: Family Medicine

## 2020-02-18 DIAGNOSIS — Z794 Long term (current) use of insulin: Secondary | ICD-10-CM

## 2020-02-18 DIAGNOSIS — E113313 Type 2 diabetes mellitus with moderate nonproliferative diabetic retinopathy with macular edema, bilateral: Secondary | ICD-10-CM

## 2020-02-18 MED ORDER — TRULICITY 0.75 MG/0.5ML ~~LOC~~ SOAJ: 0.7500 mg | SUBCUTANEOUS | 2 refills | Status: DC

## 2020-02-18 MED ORDER — FREESTYLE LIBRE 2 SENSOR MISC: 11 refills | Status: DC

## 2020-02-18 MED ORDER — FREESTYLE LIBRE 2 READER DEVI
1.0000 | Freq: Four times a day (QID) | 0 refills | Status: DC
Start: 2020-02-18 — End: 2020-02-26

## 2020-02-18 NOTE — Telephone Encounter (Signed)
Rx sent for tulicity and libre device and sensor!  Thanks for your help!

## 2020-02-18 NOTE — Progress Notes (Signed)
See phone note from pharmacy

## 2020-02-18 NOTE — Telephone Encounter (Signed)
Called patient to discuss further diabetes management.  On phone patient stated blood glucose readings have been much better with only one reported hypoglycemic event where she woke up this morning feeling shaky. She checked her blood glucose and it was 180. She attributes this to likely taking 20 units of Fiasp before bedtime. She states she did this because she did not take her Fiasp with her meal and when she checked her blood glucose before bedtime it went up to 300's and she was worried.  Per previous discuss with patient last Friday, instructed her not to take Candie Mile unless eating a meal. Reminded patient of these instructions and she verbalized understanding.  Current regimen:  Insulin degludec Evaristo Bury) 42 units once daily Insulin aspart (Fiasp) 30-34 units with meals  Self-reported blood glucose readings: Friday before dinner: 140's Friday before bed: 180's Saturday morning: 110's Saturday before dinner: 140's Saturday before bed: 190's Sunday morning: 110's Sunday before dinner: low 200's Sunday after dinner: low 300's Monday morning: 80  This patient would benefit from restarting CGM monitor until her insulin regimen is optimized. She was previously on dexcom and libre and preferred Brielle, and is willing to start utilizing again. For the time being will apply libre sample to patient for 2 weeks to have a better picture of blood glucose.   Also discussed GLP-1 initiation with patient. Previously prescribed Ozempic but unable to afford. Trulicity should be covered on insurance. Spoke with Dr. Obie Dredge who will send in prescription.  Patient to present to Rx clinic on 02/20/20.

## 2020-02-20 DIAGNOSIS — Z794 Long term (current) use of insulin: Secondary | ICD-10-CM | POA: Diagnosis not present

## 2020-02-20 DIAGNOSIS — H0288A Meibomian gland dysfunction right eye, upper and lower eyelids: Secondary | ICD-10-CM | POA: Diagnosis not present

## 2020-02-20 DIAGNOSIS — E1136 Type 2 diabetes mellitus with diabetic cataract: Secondary | ICD-10-CM | POA: Diagnosis not present

## 2020-02-20 DIAGNOSIS — E113551 Type 2 diabetes mellitus with stable proliferative diabetic retinopathy, right eye: Secondary | ICD-10-CM | POA: Diagnosis not present

## 2020-02-20 DIAGNOSIS — H0288B Meibomian gland dysfunction left eye, upper and lower eyelids: Secondary | ICD-10-CM | POA: Diagnosis not present

## 2020-02-20 DIAGNOSIS — E113522 Type 2 diabetes mellitus with proliferative diabetic retinopathy with traction retinal detachment involving the macula, left eye: Secondary | ICD-10-CM | POA: Diagnosis not present

## 2020-02-20 DIAGNOSIS — H2512 Age-related nuclear cataract, left eye: Secondary | ICD-10-CM | POA: Diagnosis not present

## 2020-02-21 DIAGNOSIS — E1142 Type 2 diabetes mellitus with diabetic polyneuropathy: Secondary | ICD-10-CM | POA: Diagnosis not present

## 2020-02-21 DIAGNOSIS — Z6841 Body Mass Index (BMI) 40.0 and over, adult: Secondary | ICD-10-CM | POA: Diagnosis not present

## 2020-02-21 DIAGNOSIS — E11319 Type 2 diabetes mellitus with unspecified diabetic retinopathy without macular edema: Secondary | ICD-10-CM | POA: Diagnosis not present

## 2020-02-21 DIAGNOSIS — E559 Vitamin D deficiency, unspecified: Secondary | ICD-10-CM | POA: Diagnosis not present

## 2020-02-21 DIAGNOSIS — G4733 Obstructive sleep apnea (adult) (pediatric): Secondary | ICD-10-CM | POA: Diagnosis not present

## 2020-02-21 DIAGNOSIS — I10 Essential (primary) hypertension: Secondary | ICD-10-CM | POA: Diagnosis not present

## 2020-02-21 DIAGNOSIS — E1165 Type 2 diabetes mellitus with hyperglycemia: Secondary | ICD-10-CM | POA: Diagnosis not present

## 2020-02-21 DIAGNOSIS — K219 Gastro-esophageal reflux disease without esophagitis: Secondary | ICD-10-CM | POA: Diagnosis not present

## 2020-02-21 DIAGNOSIS — E1121 Type 2 diabetes mellitus with diabetic nephropathy: Secondary | ICD-10-CM | POA: Diagnosis not present

## 2020-02-22 ENCOUNTER — Telehealth: Payer: Self-pay

## 2020-02-22 NOTE — Telephone Encounter (Signed)
Received fax from pharmacy, PA needed on Trulicity. Clinical questions submitted via Cover My Meds. Medication was approved and the pharmacy has been updated.

## 2020-02-25 ENCOUNTER — Other Ambulatory Visit: Payer: Self-pay

## 2020-02-25 ENCOUNTER — Ambulatory Visit (INDEPENDENT_AMBULATORY_CARE_PROVIDER_SITE_OTHER): Payer: Medicaid Other | Admitting: Pharmacist

## 2020-02-25 ENCOUNTER — Ambulatory Visit: Payer: Medicaid Other | Admitting: Pharmacist

## 2020-02-25 VITALS — Wt 263.0 lb

## 2020-02-25 DIAGNOSIS — E113313 Type 2 diabetes mellitus with moderate nonproliferative diabetic retinopathy with macular edema, bilateral: Secondary | ICD-10-CM

## 2020-02-25 DIAGNOSIS — Z794 Long term (current) use of insulin: Secondary | ICD-10-CM

## 2020-02-25 NOTE — Patient Instructions (Signed)
Ms. Guldin it was a pleasure seeing you today.   . The sensor is small waterproof disc that is placed on the back of the upper arm.  . There is a very thin filament that is inserted under the surface of the skin and measures the amount of glucose in the interstitial fluid.  . This system collects your sugar levels for up to 14 days and it automatically records the glucose level every 15 minutes. This will show your provider any patterns in your glucose levels.  Please remember... 1. Sensor will last 14 days 2. Sensor should be applied to area away from scarring, tattoos, irritation, and bones. 3. Starting the sensor: 1 hour warm up before BG readings available   4. Scan the sensor at least every 8 hours 5. Hold reader within 1.5 inches of sensor to scan 6. When the blood drop and magnifying glass symbol appears, test fingerstick blood glucose prior to making treatment decisions 7. Do a fingerstick blood glucose test if the sensor readings do not match how you feel 8. Remove sensor prior to magnetic resonance imaging (MRI), computed tomography (CT) scan, or high-frequency electrical heat (diathermy) treatment. 9. Freestyle Libre may be worn through a Environmental education officer. It may not be exposed to an advanced Imaging Technology (AIT) body scanner (also called a millimeter wave scanner) or the baggage x-ray machine. Instead, ask for hand-wanding or full-body pat-down and visual inspection.  10. Doses of vitamin C (ascorbic acid) >500 mg every day may cause false high readings. 11. Do not submerge more than 3 feet or keep underwater longer than 30 minutes at a time. Gently pat to dry.  12. Store sensor kit between 39 and 77 degrees Farenheit. Can be refrigerated within this temperature range.  Problems with Freestyle Libre sticking? 1. Order Tegaderm I.V. films to place directly over [facility] sensor on arm. 2. May also order Skin Tac from [facility]. Alcohol swab area you plan to  administer Freestyle Libre then let dry. Once dry, apply Skin Tac in a circular motion (with a spot in the middle for sensor without skin tac) and let dry. Once dry you can apply Freestyle Libre   Problems taking off Freestyle [place]? 1. Remember to try to shower before removing Freestyle Libre 2. Order Tac Away to help remove any extra adhesive left on your skin once you remove Freestyle Libre 3. May also try baby oil to loosen adhesive  [place] Phone number: [phone] Available 7 days a week; excluding holidays 8 AM to 8PM EST  [email or web address]  Please do the following:  1. Decrease Fiasp from 30 units to 20 units with meals AND Decrease Tresiba from 40 units to 35 units starting Wednesday at first dose of Trulicity as directed today during your appointment. If you have any questions or if you believe something has occurred because of this change, call me or your doctor to let one of [place] know.  2. Continue checking blood sugars at home. It's really important that you record these and bring these in to your next doctor's appointment.  3. Continue making the lifestyle changes we've discussed together during our visit. Diet and exercise play a significant role in improving your blood sugars.  4. Follow-up with me via telephone in one week.   Hypoglycemia or low blood sugar:   Low blood sugar can happen quickly and may become an emergency if not treated right away.   While this shouldn't happen often, it can be  brought upon if you skip a meal or do not eat enough. Also, if your insulin or other diabetes medications are dosed too high, this can cause your blood sugar to go to low.   Warning signs of low blood sugar include: 1. Feeling shaky or dizzy 2. Feeling weak or tired  3. Excessive hunger 4. Feeling anxious or upset  5. Sweating even when you aren't exercising  What to do if I experience low blood sugar? Follow the Rule of 15 1. Check your blood sugar.  If lower than 70, proceed to step 2.  2. Treat with 15 grams of fast acting carbs which is found in 3-4 glucose tablets. If none are available you can try hard candy, 1 tablespoon of sugar or honey,4 ounces of fruit juice, or 6 ounces of REGULAR soda.  3. Re-check your sugar in 15 minutes. If it is still below 70, do what you did in step 2 again. If your blood sugar has come back up, go ahead and eat a snack or small meal made up of complex carbs (ex. Whole grains) and protein at this time to avoid recurrence of low blood sugar.

## 2020-02-25 NOTE — Progress Notes (Signed)
S:     PMH significant for T2DM.   Patient presents today for diabetes management and Libre 2.0 application. Patient was referred and last seen by Primary Care Provider 01/07/20.  Patient reports diabetes was diagnosed in 2004.   Insurance coverage/medication affordability: Medicaid  Patient reports adherence with medications.  Current diabetes medications include: Insulin aspart (Fiasp) 30 units TID with meals, insulin degludec [name]) 40 units once daily (PM)  Patient reported dietary habits:  Eats 3 meals/day and 3 snacks/day; Boluses with Fiasp 41meals/day Breakfast: smoothie or unsweetened applesauce Lunch: wrap with side of chips  Dinner: veggie pasta or salad and protein Snacks: fruit, trail mix, unsweetened applesauce Drinks:unsweetened tea, water Patient states she is currently intermittent fasting and does not eat full meal until after 2 PM. This does not include breakfast as it is not a "full meal."  Patient-reported exercise habits: currently not exercising regularly although set goal to improve   Patient denies hypoglycemic events. Patient denies polyuria (increased urination).  Patient reports polyphagia (increased appetite) around 10-11 PM Patient reports polydipsia (increased thirst).  Patient reports neuropathy (nerve pain). Patient has no new visual changes. Patient reports self foot exams.   Freestyle Libre 2.0 patient education Patient taking >500 mg Vitamin C: denies Reminded patient to not take Vitamin C with Freestyle Libre.  Instruction: [employer] 14-day Freestyle Libre patient education  CGM overview and set-up 1. Button, touch screen, and icons 2. Power supply and recharging 3. Home screen 4. Date and time 5. Set BG target range: 80-250 mg/dL 6. Set alarm/alert tone  7. Interstitial vs. capillary blood glucose readings  8. When to verify sensor reading with fingerstick blood glucose  Sensor application -- sensor placed on right arm 1. Site  selection and site prep with alcohol pad/Skin Tac 2. Sensor prep-sensor pack and sensor applicator 3. Starting the sensor: 1 hour warm up before BG readings available     Will ask for fingersticks the first 12 hours   4. Sensor change every 14 days and rotate site 5. Call [employer] customer service if sensor comes off before 14 days  Safety and Troubleshooting 1. Scan the sensor at least every 8 hours 2. When the "test BG" symbol appears, test fingerstick blood glucose prior to    making treatment decisions 3. Do a fingerstick blood glucose test if the sensor readings do not match how    you feel 4. Remove sensor prior for MRI or CT. Sensor may be damaged by exposure to    airport x-ray screening 5. Vitamin C may cause false high readings and aspirin may cause false low     readings 6. Store sensor kit between 39 and 77 degrees. Can be refrigerated at this temp.  Contact information provided for [employer] customer service and/or trainer.   O:   Self-reported blood glucose readings: Wednesday: 140 (fasting), 130 (before lunch), 210 (before dinner) Thursday: 142 (fasting), 113 (before lunch), 237 (before dinner)  Friday: 120 (fasting), 220 (before dinner), 306 (before bed)  Saturday: 237 (fasting), 199 (before lunch), 202 (before dinner) Sunday: 241 (after lunch), 203 (before dinner)  Today: 230 (fasting)  Labs:   Vitals:   02/25/20 1526  Weight: 263 lb (119.3 kg)    Lab Results  Component Value Date   HGBA1C 11.5 (A) 01/07/2020   HGBA1C 10.8 (A) 10/02/2019   HGBA1C 12.2 (A) 06/21/2019  Patient reports A1C checked last week at other provider office and it was 10.7%  Lab Results  Component Value Date  CPEPTIDE 2.03 03/10/2012       Component Value Date/Time   CHOL 208 (H) 09/14/2018 1502   TRIG 190 (H) 09/14/2018 1502   HDL 52 09/14/2018 1502   CHOLHDL 4.0 09/14/2018 1502   CHOLHDL 3.4 03/21/2015 1230   VLDL 30 03/21/2015 1230   LDLCALC 123 (H) 09/14/2018 1502     Lab Results  Component Value Date   MICRALBCREAT >300 08/25/2018    PHQ-9 Score: 7; Patient attributes this to being switched from once weekly vitamin D which she states had greatly improved her mood.   Assessment: T2DM longstanding is not controlled likely due previously high dose of bolus and low dose of basal insulin and further medication adjustments needed. Medication adherence appears optimal. Freestyle Libre 2.0 CGM placed on patient's right arm successfully. Patient is able to verbalize appropriate hypoglycemia management plan. Due to patient's previously reported symptomatic hypoglycemia, will decrease basal and bolus insulin on day that patient begins Trulicity and then monitor blood glucose closely.  Plan: 1. Decreased dose of basal insulin degludec [name]) to 35 units once daily starting on Wednesday 2. Decreased dose of  rapid insulin aspart (Fiasp) to 20 units TID with meals starting on Wednesday 3. Started GLP-1 dulaglutide (Trulicity) 0.75mg  on Wednesday  4. Continue to use Freestyle Libre 2.0 CGM 5. Extensively discussed pathophysiology of diabetes, recommended lifestyle interventions, dietary effects on blood sugar control 6. Counseled on s/sx of and management of hypoglycemia 7. Next A1C anticipated May 2022.   Follow-up appointment in one week via telephone to review sugar readings. Written patient instructions provided.  This appointment required 60 minutes of patient care (this includes precharting, chart review, review of results, and face-to-face care).  Thank you for involving pharmacy to assist in providing this patient's care.  Review patient and plan with Dr. Madison Hickman, MD

## 2020-02-26 ENCOUNTER — Telehealth: Payer: Self-pay | Admitting: *Deleted

## 2020-02-26 ENCOUNTER — Other Ambulatory Visit: Payer: Self-pay | Admitting: Family Medicine

## 2020-02-26 DIAGNOSIS — E113313 Type 2 diabetes mellitus with moderate nonproliferative diabetic retinopathy with macular edema, bilateral: Secondary | ICD-10-CM

## 2020-02-26 DIAGNOSIS — Z794 Long term (current) use of insulin: Secondary | ICD-10-CM

## 2020-02-26 MED ORDER — DEXCOM G6 SENSOR MISC: 11 refills | Status: DC

## 2020-02-26 MED ORDER — DEXCOM G6 TRANSMITTER MISC: 3 refills | Status: DC

## 2020-02-26 MED ORDER — DEXCOM G6 RECEIVER DEVI: 1.0000 | Freq: Once | 0 refills | Status: AC

## 2020-02-26 NOTE — Progress Notes (Signed)
Insurance will only cover Dexcom

## 2020-02-26 NOTE — Telephone Encounter (Signed)
Hi Rachelle,  Just letting you know that we will have to switch to [facility] for her (I saw you just set up the FreeStyle yesterday).  I have placed the orders, but wanted you to know for future set up.  Thanks! Fredric Mare

## 2020-02-26 NOTE — Telephone Encounter (Signed)
Received fax from pharmacy that states that the [facility] 2 reader and sensor are NOT covered by medicaid.  It says that- They will pay for the [facility] brand. Can we switch to that brand.  If appropriate please resend new Rx to pharmacy.

## 2020-02-29 ENCOUNTER — Ambulatory Visit (INDEPENDENT_AMBULATORY_CARE_PROVIDER_SITE_OTHER): Payer: Medicaid Other | Admitting: Podiatry

## 2020-02-29 ENCOUNTER — Other Ambulatory Visit: Payer: Self-pay

## 2020-02-29 ENCOUNTER — Encounter: Payer: Self-pay | Admitting: Podiatry

## 2020-02-29 DIAGNOSIS — M21869 Other specified acquired deformities of unspecified lower leg: Secondary | ICD-10-CM

## 2020-02-29 DIAGNOSIS — M722 Plantar fascial fibromatosis: Secondary | ICD-10-CM | POA: Diagnosis not present

## 2020-02-29 DIAGNOSIS — M216X9 Other acquired deformities of unspecified foot: Secondary | ICD-10-CM | POA: Diagnosis not present

## 2020-02-29 NOTE — Progress Notes (Signed)
Subjective:  Patient ID: Karina [name], female    DOB: 13-Sep-1985,  MRN: [id number]  Chief Complaint  Patient presents with  . Diabetes    Follow up appointment.    35 y.o. female presents with the above complaint.  Patient presents with complaint of bilateral heel pain that has been going on for quite some time.  Patient states the pain is getting worse.  She also has diabetes that is uncontrolled and getting problems with the neuropathy.  She would like to address the heel pain.  She denies treating anything for she denies doing stretching exercises she has not seen anyone else prior to see me for this heel pain.  She denies any other acute complaints.   Review of Systems: Negative except as noted in the HPI. Denies N/V/F/Ch.  Past Medical History:  Diagnosis Date  . Acute pain of right shoulder 03/13/2019  . Allergy   . Back pain 12/12/2017  . Carpal tunnel syndrome of right wrist 03/13/2019  . COVID-19 01/24/2019  . Depression 12/27/2012  . Diabetes mellitus    Type 2, insulin resistant  . Diabetic neuropathy, type II diabetes mellitus ([place]) 05/23/2013  . DOE (dyspnea on exertion) 05/21/2019   Onset with covid 19 infection symptoms started 01/16/19  -  05/23/2019   Walked RA  3 laps @ approx 254ft each @ moderat pace  stopped due to end of study,  no limiting sob/ sats 100% at end despite reported sob at rest   . Essential hypertension, benign 12/12/2013  . GERD (gastroesophageal reflux disease)   . History of COVID-19 03/22/2019  . Lower extremity edema 08/29/2017  . Moderate nonproliferative diabetic retinopathy ([place]) 05/24/2013   Both OS and OD.  Diagnosed by Dr. Loyal Buba, MD, PhD 05/23/13. Also mild macular edema. Follows with Dr. Katy Fitch  . Morbid obesity ([place]) 05/25/2013  . Nephrotic syndrome due to diabetes mellitus ([place]) 04/10/2019  . 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 Shortness of breath 03/22/2019  . Sleep apnea    doesn't have machine yet  . Syncope 04/08/2015  . Tachycardia 05/21/2019  .  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 Upper airway cough syndrome 03/22/2019    Current Outpatient Medications:  .  albuterol (VENTOLIN HFA) 108 (90 Base) MCG/ACT inhaler, Inhale 1-2 puffs into the lungs every 4 (four) hours as needed for shortness of breath., Disp: , Rfl:  .  benzonatate (TESSALON) 200 MG capsule, Take three times a day until no longer coughing, then take as needed, Disp: 45 capsule, Rfl: 1 .  Blood Pressure Monitoring (BLOOD PRESSURE MONITOR AUTOMAT) DEVI, 1 kit by Does not apply route daily. Please call the office if you have any issues obtaining this monitor. (Patient not taking: No sig reported), Disp: 1 each, Rfl: 0 .  carvedilol (COREG) 6.25 MG tablet, Take 6.25 mg by mouth 2 (two) times daily with a meal., Disp: , Rfl:  .  Cholecalciferol (VITAMIN D3) 50 MCG (2000 UT) CHEW, Chew 2,000 Units by mouth daily. (Patient not taking: No sig reported), Disp: 30 tablet, Rfl: 3 .  Continuous Blood Gluc Sensor (DEXCOM G6 SENSOR) MISC, Place one sensor every 10 days., Disp: 3 each, Rfl: 11 .  Continuous Blood Gluc Transmit (DEXCOM G6 TRANSMITTER) MISC, Use every 90 days to transmit information from sensor., Disp: 1 each, Rfl: 3 .  Dulaglutide (TRULICITY) 7.51 WC/5.8NI SOPN,  Inject 0.75 mg into the skin once a week., Disp: 1 mL, Rfl: 2 .  fluticasone-salmeterol (ADVAIR HFA) 115-21 MCG/ACT inhaler, Inhale 2 puffs into the lungs 2 (two) times daily., Disp: 1 each, Rfl: 3 .  hydroxypropyl methylcellulose / hypromellose (ISOPTO TEARS / GONIOVISC) 2.5 % ophthalmic solution, Place 1 drop into both eyes as needed for dry eyes., Disp: , Rfl:  .  Insulin Aspart FlexPen 100 UNIT/ML SOPN, Inject 20-28 Units into the skin 3 (three) times daily with meals., Disp: 3 mL, Rfl: 3 .  insulin degludec (TRESIBA FLEXTOUCH) 200 UNIT/ML FlexTouch Pen, Inject 30 Units into the  skin daily., Disp: 3 mL, Rfl: 2 .  Insulin Degludec (TRESIBA) 100 UNIT/ML SOLN, Inject 60 Units into the skin daily.  (Patient not taking: No sig reported), Disp: , Rfl:  .  insulin lispro (HUMALOG KWIKPEN) 200 UNIT/ML KwikPen, INJECT 20 TO 28 UNITS SUBCUTANEOUSLY THREE TIMES DAILY (Patient not taking: No sig reported), Disp: 12 mL, Rfl: 0 .  Insulin Pen Needle (B-D ULTRAFINE III SHORT PEN) 31G X 8 MM MISC, 1 Container by Does not apply route as needed., Disp: 1 each, Rfl: 11 .  losartan (COZAAR) 100 MG tablet, Take 100 mg by mouth daily., Disp: , Rfl:  .  pantoprazole (PROTONIX) 40 MG tablet, Take 1 tablet (40 mg total) by mouth daily., Disp: 90 tablet, Rfl: 3  Social History   Tobacco Use  Smoking Status Never Smoker  Smokeless Tobacco Never Used    Allergies  Allergen Reactions  . Lisinopril Cough  . Eggs Or Egg-Derived Products Nausea And Vomiting  . Contrast Media [Iodinated Diagnostic Agents]    Objective:  There were no vitals filed for this visit. There is no height or weight on file to calculate BMI. Constitutional Well developed. Well nourished.  Vascular Dorsalis pedis pulses palpable bilaterally. Posterior tibial pulses palpable bilaterally. Capillary refill normal to all digits.  No cyanosis or clubbing noted. Pedal hair growth normal.  Neurologic Normal speech. Oriented to person, place, and time. Epicritic sensation to light touch grossly present bilaterally.  Dermatologic Nails well groomed and normal in appearance. No open wounds. No skin lesions.  Orthopedic: Normal joint ROM without pain or crepitus bilaterally. No visible deformities. Tender to palpation at the calcaneal tuber bilaterally. No pain with calcaneal squeeze bilaterally. Ankle ROM diminished range of motion bilaterally. [place] Test: positive bilaterally.   Radiographs: None  Assessment:   1. Plantar fasciitis of right foot   2. Plantar fasciitis of left foot    Plan:  Patient  was evaluated and treated and all questions answered.  Plantar Fasciitis, bilaterally with underlying ankle equinus - XR reviewed as above.  - Educated on icing and stretching. Instructions given.  - Injection delivered to the plantar fascia as below. - DME: Plantar Fascial Brace x2 - Pharmacologic management: None  Procedure: Injection Tendon/Ligament Location: Bilateral plantar fascia at the glabrous junction; medial approach. Skin Prep: alcohol Injectate: 0.5 cc 0.5% marcaine plain, 0.5 cc of 1% Lidocaine, 0.5 cc kenalog 10. Disposition: Patient tolerated procedure well. Injection site dressed with a band-aid.  No follow-ups on file.

## 2020-03-03 ENCOUNTER — Telehealth: Payer: Self-pay | Admitting: Pharmacist

## 2020-03-03 NOTE — Telephone Encounter (Cosign Needed Addendum)
Called patient to follow-up regarding Libre device, Trulicity initiation, and blood glucose readings.   Patient states she has been tolerating Trulicity well but her blood glucose readings have increased.   Current regimen: Trulicity 0.75mg  (started on Thursday) Insulin aspart (Fiasp) 20 units with meals Insulin degludec Evaristo Bury) 30 units once daily    Patient has had no HYPOglycemic events since initiating Trulicity and blood glucose readings have consistently ran >200.   Recommended patient increase Fiasp to 24 units with meals and increase Tresiba to 36 units. Patient repeated back instructions. Counseled on Rule of 15 for hypoglycemic events.   Will call patient on 03/07/20 to follow-up.   Discussed patient and recommendations with Dr. Jules Schick, DO

## 2020-03-04 NOTE — Telephone Encounter (Signed)
Noted and agree. 

## 2020-03-07 ENCOUNTER — Telehealth: Payer: Self-pay | Admitting: Pharmacist

## 2020-03-07 NOTE — Telephone Encounter (Addendum)
Called patient to follow-up on Libre device and further insulin management.   Patient reported issues with device over past several days and reached out to [employer] and they should be sending patient new sensor by early next week. Patient was on her way to pick up new test strips.Instructed patient to check blood glucose with glucometer in the interim and will follow-up on Wednesday.

## 2020-03-10 ENCOUNTER — Encounter: Payer: Self-pay | Admitting: Podiatry

## 2020-03-10 DIAGNOSIS — Z6841 Body Mass Index (BMI) 40.0 and over, adult: Secondary | ICD-10-CM | POA: Diagnosis not present

## 2020-03-10 DIAGNOSIS — Z713 Dietary counseling and surveillance: Secondary | ICD-10-CM | POA: Diagnosis not present

## 2020-03-12 ENCOUNTER — Telehealth: Payer: Self-pay | Admitting: Pharmacist

## 2020-03-12 ENCOUNTER — Other Ambulatory Visit: Payer: Self-pay | Admitting: *Deleted

## 2020-03-12 ENCOUNTER — Other Ambulatory Visit: Payer: Self-pay | Admitting: Family Medicine

## 2020-03-12 DIAGNOSIS — E113313 Type 2 diabetes mellitus with moderate nonproliferative diabetic retinopathy with macular edema, bilateral: Secondary | ICD-10-CM

## 2020-03-12 DIAGNOSIS — Z794 Long term (current) use of insulin: Secondary | ICD-10-CM

## 2020-03-12 MED ORDER — INPEN 100-BLUE-NOVO DEVI: 1.0000 "pen " | Freq: Once | 2 refills | Status: AC

## 2020-03-12 NOTE — Patient Outreach (Signed)
Care Coordination  03/12/2020  Karina [name] 1985/12/14 [id number]   Medicaid Managed Care   Unsuccessful Outreach Note  03/12/2020 Name: Karina [name] MRN: [id number] DOB: 01-21-1985  Referred by: Melene Plan, MD Reason for referral : High Risk Managed Medicaid (Unsuccessful RNCM follow up outreach)   An unsuccessful telephone outreach was attempted today. The patient was referred to the case management team for assistance with care management and care coordination.   Follow Up Plan: The care management team will reach out to the patient again over the next 7-14 days.   Estanislado Emms RN, BSN   [employer] [employer]

## 2020-03-12 NOTE — Telephone Encounter (Signed)
Inpen sent into pharmacy, thanks!  [name] MD

## 2020-03-12 NOTE — Telephone Encounter (Signed)
Called patient to follow-up on Libre sensor and diabetes management.  Current medications: Insulin aspart (Fiasp) 20 units with meals Insulin degludec [name]) 30 units with meals Dulaglutide (Trulicity) 3.70DU  Patient was previously having issues with Libre sensor and after contacting [employer] they sent her a new sensor which she placed on her arm on Saturday (2/26).   Data from [place]:    Patient continues to fluctuate with blood glucose and attributes it likely to timing and amount of carbohydrates she consumes at meals. She states she met with nutritionist on Monday and is now motivated to get back on track with her diet.   Reports one episode of HYPOglycemia on 2/26 in early AM in which she woke up feeling unwell. She treated with hard candy and states she did not re-check her BG but felt better and went back to bed.   Patient will be taking her third dose of Trulicity today and has been tolerating well. Future plan is to continue to titrate up dose after she completes a total of 4 doses of 0.55m.   I believe patient is good candidate for inpen and Medicaid covers Novolog cartridges for use with pen. In the meantime will have patient go back to utilizing sliding scale for insulin aspart (Fiasp) to avoid hypoglycemic events. Instructed patient to take 10-20 units of Fiasp depending on amount of carbohydrates consumed during meal. Counseled on proper treatment of hypoglycemic events. Patient repeated back instructions.   Discussed patient and recommendations with Dr. MYehuda Savannah MD

## 2020-03-12 NOTE — Progress Notes (Signed)
Inpen sent to pharmacy.  [name] MD

## 2020-03-12 NOTE — Patient Instructions (Signed)
Visit Information  [name]  - as a part of your Medicaid benefit, you are eligible for care management and care coordination services at no cost or copay. I was unable to reach you by phone today but would be happy to help you with your health related needs. Please feel free to call me @ [phone].   A member of the Managed Medicaid care management team will reach out to you again over the next 7-14 days.   Estanislado Emms RN, BSN [place]  [employer]

## 2020-03-14 ENCOUNTER — Telehealth: Payer: Self-pay

## 2020-03-14 NOTE — Telephone Encounter (Signed)
I was able to get Dexcom supplies covered by insurance via covermymeds. I spoke with the pharmacy who confirmed 0 dollar copay for patient. The patient has been updated.

## 2020-03-17 ENCOUNTER — Other Ambulatory Visit: Payer: Self-pay

## 2020-03-17 ENCOUNTER — Ambulatory Visit: Payer: Medicaid Other | Admitting: Allergy

## 2020-03-17 ENCOUNTER — Encounter: Payer: Self-pay | Admitting: Allergy

## 2020-03-17 VITALS — BP 130/88 | HR 97 | Temp 97.7°F | Resp 18 | Ht 62.0 in | Wt 256.8 lb

## 2020-03-17 DIAGNOSIS — R12 Heartburn: Secondary | ICD-10-CM | POA: Diagnosis not present

## 2020-03-17 DIAGNOSIS — J4540 Moderate persistent asthma, uncomplicated: Secondary | ICD-10-CM

## 2020-03-17 DIAGNOSIS — T781XXD Other adverse food reactions, not elsewhere classified, subsequent encounter: Secondary | ICD-10-CM | POA: Diagnosis not present

## 2020-03-17 DIAGNOSIS — J3089 Other allergic rhinitis: Secondary | ICD-10-CM

## 2020-03-17 MED ORDER — MONTELUKAST SODIUM 10 MG PO TABS: 10.0000 mg | ORAL_TABLET | Freq: Every day | ORAL | 5 refills | Status: DC

## 2020-03-17 NOTE — Progress Notes (Signed)
New Patient Note  RE: Karina [name] MRN: [id number] DOB: [birth year] Date of Office Visit: 03/17/2020  Referring provider: Martyn Ehrich, NP Primary care provider: Wilber Oliphant, MD  Chief Complaint: Allergic Reaction  History of Present Illness: I had the pleasure of seeing Karina [name] for initial evaluation at the [facility] on 03/18/2020. She is a 35 y.o. female, who is referred here by Dr. Melvyn Novas (pulmonology) for the evaluation of asthma and IgE level.  Respiratory:  She reports symptoms of chest tightness, shortness of breath, coughing, wheezing, nocturnal awakenings for 1 years. Patient had Covid-19 in January 2021 and afterwards started having issues with her breathing. Current medications include albuterol prn and Advair 182mcg 2 puffs twice a day which help. She reports not using aerochamber with inhalers. She tried the following inhalers: Flovent. Main triggers are exertion, stress. In the last month, frequency of symptoms: daily. Frequency of nocturnal symptoms: 1-2 times per night.  Patient has OSA but not on CPAP yet as machines are on backorder.   Frequency of SABA use: 4x/week. Interference with physical activity: sometimes. Sleep is disturbed. In the last 12 months, emergency room visits/urgent care visits/doctor office visits or hospitalizations due to respiratory issues: no. In the last 12 months, oral steroids courses: 2. Lifetime history of hospitalization for respiratory issues: no. Prior intubations: no. History of pneumonia: no. She was evaluated by pulmonologist in the past. Smoking exposure: no. Up to date with flu vaccine: no. Up to date with COVID-19 vaccine: yes.  History of reflux: yes and takes pantoprazole daily.  Patient is in the process of getting evaluated to get bariatric surgery.  Patient follows with nephrology for CKD.  10/27/2019 CXR: "IMPRESSION: Hazy bibasilar opacities, likely atelectasis."  01/25/20 pulm visit: "35  year old female, never smoked. PMH significant UACS, GERD, hypertension, type 2 diabetes, hx covid-19, vit D deficiency, lower extremity swelling, obesity. Patient of Dr. Melvyn Novas, last seen on 07/02/19.   12/25/2019 Patient presents today for acute visit. Reports 2 weeks of increased cough. Associated dyspnea, wheezing, fluid retention. Weight is up 13 lbs since September 2021. She last took lasix two days ago. She saw neurology for sleep consult, dx with OSA. Awaiting CPAP d/t Producer, television/film/video. She has started the process to potentially get bariatric surgery. She is compliant with pantoprazole $RemoveBeforeDEI'40mg'slMkkPkLPJXDCivo$  twice daily, GI took her off pepcid. She had a normal EGD on 12/14/19. Denies post nasal drip symptoms.   Dyspnea: With exertion and associated wheezing  Cough: Intermittent dry cough Nocturnal symptoms: Dx with sleep apnea , nocturnal cough  SABA: Rare   01/25/2020 Patient contacts today for 1 month follow-up televisit. Patient had increased cough over 2 weeks with associated dyspnea on exertion and wheezing. During last visit ordered PFTs and gave patient trial Flovent 110mcg 2 puffs twice daily. We also added OTC antihistamine.  Patient states that flovent has helped with her cough and wheeze. Shortness of breath is similar to previous. Spirometry was normal in December 2021. There was some evidence of air trapping. Her PCP took her off lasix, she has not noticed a difference in breathing since stopping this. She had a CTA in September 2021 that showed mild central bronchial wall thickening with heterogeneous pulmonary parenchyma which can be seen with asthma or small airway disease. "   Component     Latest Ref Rng & Units 12/25/2019  IgE (Immunoglobulin E), Serum     <OR=114 kU/L 676 (H)   Component  Latest Ref Rng & Units 05/24/2019 10/10/2019 10/27/2019 12/25/2019  WBC     4.0 - 10.5 K/uL 6.7 6.7 5.8 6.5  RBC     3.87 - 5.11 Mil/uL 3.92 3.68 (L) 3.54 (L) 3.56 (L)  Hemoglobin     12.0 -  15.0 g/dL 10.8 (L) 9.7 (L) 9.5 (L) 9.6 (L)  HCT     36.0 - 46.0 % 32.9 (L) 30.8 (L) 29.6 (L) 29.0 (L)  MCV     78.0 - 100.0 fl 83.8 83.7 83.6 81.6  MCHC     30.0 - 36.0 g/dL 33.0 31.5 32.1 33.1  RDW     11.5 - 15.5 % 14.2 13.2 13.2 14.5  Platelets     150.0 - 400.0 K/uL 415.0 (H) 448 (H) 349 347.0  Neutrophils     43.0 - 77.0 % 61.7   65.9  Lymphocytes     12.0 - 46.0 % 27.1   26.4  Monocytes Relative     3.0 - 12.0 % 6.5   5.0  Eosinophil     0.0 - 5.0 % 2.7   2.0  Basophil     0.0 - 3.0 % 2.0   0.7  NEUT#     1.4 - 7.7 K/uL 4.1   4.3  Lymphocyte #     0.7 - 4.0 K/uL 1.8   1.7  Monocyte #     0.1 - 1.0 K/uL 0.4   0.3  Eosinophils Absolute     0.0 - 0.7 K/uL 0.2   0.1    Assessment and Plan: Karina [name] is a 35 y.o. female with: Other allergic rhinitis Perennial rhinoconjunctivitis symptoms for 10+ years with worsening in the spring and fall.  Tried Allegra with good benefit.  No prior allergy/ENT evaluation.  Today's skin testing showed: Positive to tree pollen, mold, and borderline to cat and dog.   Start environmental control measures as below.  May use over the counter antihistamines such as Zyrtec (cetirizine), Claritin (loratadine), Allegra (fexofenadine), or Xyzal (levocetirizine) daily as needed. Start Singulair (montelukast) 10mg  daily at night. Cautioned that in some children/adults can experience behavioral changes including hyperactivity, agitation, depression, sleep disturbances and suicidal ideations. These side effects are rare, but if you notice them you should notify me and discontinue Singulair (montelukast).  Read about allergy injections - handout given.   Reactive airway disease Diagnosed with asthma/RAD about 1 year ago after she had COVID-19. Currently on Advair 128mcg 2 puffs BID and using albuterol about 4 times per week. Main triggers are exertion/stress but concerned about allergies. Follows with pulmonology. PMHx significant for GERD, CKD,  insulin dependent DM, HTN, obesity, OSA.  12/25/2019 IgE 676; eos 100, H&H 9.6/29  Continue to follow up with pulmonology. Daily controller medication(s): continue Advair 145mcg 2 puffs twice a day with spacer and rinse mouth afterwards. Start Singulair 10mg  daily at night as above. Spacer given and demonstrated proper use with inhaler. Patient understood technique and all questions/concerned were addressed.  May use albuterol rescue inhaler 2 puffs every 4 to 6 hours as needed for shortness of breath, chest tightness, coughing, and wheezing. May use albuterol rescue inhaler 2 puffs 5 to 15 minutes prior to strenuous physical activities. Monitor frequency of use.   Heartburn  Continue Protonix 40mg  daily.  Continue dietary and lifestyle modifications.  Adverse reaction to food, subsequent encounter Currently avoiding kiwi, pineapple and eggs as they caused vomiting in the past. Tolerates baked eggs with no issues.   Continue to avoid  above foods - consider skin testing in future.   For mild symptoms you can take over the counter antihistamines such as Benadryl and monitor symptoms closely. If symptoms worsen or if you have severe symptoms including breathing issues, throat closure, significant swelling, whole body hives, severe diarrhea and vomiting, lightheadedness then seek immediate medical care.  Return in about 2 months (around 05/17/2020).  Meds ordered this encounter  Medications  . montelukast (SINGULAIR) 10 MG tablet    Sig: Take 1 tablet (10 mg total) by mouth at bedtime.    Dispense:  30 tablet    Refill:  5   Lab Orders  No laboratory test(s) ordered today    Other allergy screening: Rhino conjunctivitis: yes  She reports symptoms of itchy/watery eyes, sneezing. Symptoms have been going on for 10+ years. The symptoms are present all year around with worsening in spring and fall. Anosmia: no. Headache: no. She has used allegra with fair improvement in symptoms. Uses  artificial tears and had detached retina with increased pressure. Sinus infections: no. Previous work up includes: none. Previous ENT evaluation: no. Previous sinus imaging: no. History of nasal polyps: no. Last eye exam: recently.  Food allergy: yes  Eggs cause vomiting since a child.  Tolerates baked eggs with no issues. Pineapple also causes vomiting.  Kiwi causes nausea.   Past work up includes: none Dietary History: patient has been eating other foods including milk, peanut, treenuts, sesame, shellfish, fish, soy, wheat, meats, fruits and vegetables.  She reports reading labels and avoiding eggs in diet completely. She tolerates baked egg and baked milk products.   Medication allergy: yes  Lisinopril - cough Hymenoptera allergy: no Urticaria: no Eczema: as a child History of recurrent infections suggestive of immunodeficency: no  Diagnostics: Skin Testing: Environmental allergy panel. Positive to tree pollen, mold, and borderline to cat and dog.  Results discussed with patient/family.  Airborne Adult Perc - 03/17/20 1443    Time Antigen Placed 1443    Allergen Manufacturer Lavella Hammock    Location Back    Number of Test 59    Panel 1 Select    1. Control-Buffer 50% Glycerol Negative    2. Control-Histamine 1 mg/ml 2+    3. Albumin saline Negative    4. [place] Negative    5. [place] Negative    6. Johnson Negative    7. Watts Blue Negative    8. Meadow Fescue Negative    9. Perennial Rye Negative    10. Sweet Vernal Negative    11. Timothy Negative    12. Cocklebur Negative    13. Burweed Marshelder Negative    14. Ragweed, short Negative    15. Ragweed, Giant Negative    16. Plantain,  English Negative    17. Lamb's Quarters Negative    18. Sheep Sorrell Negative    19. Rough Pigweed Negative    20. Marsh Elder, Rough Negative    21. Mugwort, Common Negative    22. Ash mix Negative    23. Birch mix Negative    24. Beech American Negative    25. Box, Elder 2+     26. Cedar, red Negative    27. Cottonwood, [place] Negative    28. Elm mix Negative    29. Hickory Negative    30. Maple mix Negative    31. Oak, [place] mix Negative    32. Pecan Pollen Negative    33. Pine mix Negative    34. Sycamore Eastern Negative  [place], Black Pollen Negative    36. Alternaria alternata Negative    37. Cladosporium Herbarum Negative    38. Aspergillus mix Negative    39. Penicillium mix Negative    40. Bipolaris sorokiniana (Helminthosporium) Negative    41. Drechslera spicifera (Curvularia) Negative    42. Mucor plumbeus Negative    43. Fusarium moniliforme Negative    44. Aureobasidium pullulans (pullulara) Negative    45. Rhizopus oryzae Negative    46. Botrytis cinera Negative    47. Epicoccum nigrum Negative    48. Phoma betae Negative    49. Candida Albicans Negative    50. Trichophyton mentagrophytes Negative    51. Mite, D Farinae  5,000 AU/ml Negative    52. Mite, D Pteronyssinus  5,000 AU/ml Negative    53. Cat Hair 10,000 BAU/ml Negative    54.  Dog Epithelia Negative    55. Mixed Feathers Negative    56. Horse Epithelia Negative    57. Cockroach, German Negative    58. Mouse Negative    59. Tobacco Leaf Negative          Intradermal - 03/17/20 1536    Time Antigen Placed 1536    Allergen Manufacturer Lavella Hammock    Location Arm    Number of Test 14    Intradermal Select    Control Negative    [place] Negative    Johnson Negative    7 Grass Negative    Ragweed mix Negative    Weed mix Negative    Mold 1 Negative    Mold 2 3+    Mold 3 2+    Mold 4 2+    Cat --   +/-   Dog --   +/-   Cockroach Negative    Mite mix Negative           Past Medical History: Patient Active Problem List   Diagnosis Date Noted  . Other allergic rhinitis 03/18/2020  . Reactive airway disease 03/18/2020  . Adverse reaction to food, subsequent encounter 03/18/2020  . Heartburn 03/18/2020  . Vitamin D deficiency 09/04/2019  . Stable  proliferative diabetic retinopathy of both eyes associated with type 2 diabetes mellitus ([place]) 08/17/2019  . Current mild episode of major depressive disorder ([place]) 08/08/2019  . Sleep-related headache 07/05/2019  . 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 Cephalalgia 07/05/2019  . Proliferative diabetic retinopathy of right eye ([place]) 06/20/2019  . Proliferative diabetic retinopathy of left eye ([place]) 06/20/2019  . Vitreous hemorrhage of left eye ([place]) 06/20/2019  . DOE (dyspnea on exertion) 05/21/2019  . Tachycardia 05/21/2019  . History of COVID-19 03/22/2019  . Upper airway cough syndrome 03/22/2019  . Shortness of breath 03/22/2019  . Carpal tunnel syndrome of right wrist 03/13/2019  . Gastroesophageal reflux disease 03/23/2018  . Lower extremity edema 08/29/2017  . Hypertension associated with stage 2 chronic kidney disease due to type 2 diabetes mellitus ([place]) 12/12/2013  . Morbid obesity ([place]) 05/25/2013  . Type 2 diabetes mellitus with both eyes affected by moderate nonproliferative retinopathy and macular edema, with long-term current use of insulin ([place]) 12/27/2012  . Depression 12/27/2012   Past Medical History:  Diagnosis Date  . Acute pain of right shoulder 03/13/2019  . Allergy   . Angio-edema   . Asthma   . Back  pain 12/12/2017  . Carpal tunnel syndrome of right wrist 03/13/2019  . COVID-19 01/24/2019  . Depression 12/27/2012  . Diabetes mellitus    Type 2, insulin resistant  . Diabetic neuropathy, type II diabetes mellitus ([place]) 05/23/2013  . DOE (dyspnea on exertion) 05/21/2019   Onset with covid 19 infection symptoms started 01/16/19  -  05/23/2019   Walked RA  3 laps @ approx 260ft each @ moderat pace  stopped due to end of study,  no limiting sob/ sats 100% at end despite  reported sob at rest   . Essential hypertension, benign 12/12/2013  . GERD (gastroesophageal reflux disease)   . History of COVID-19 03/22/2019  . Lower extremity edema 08/29/2017  . Moderate nonproliferative diabetic retinopathy ([place]) 05/24/2013   Both OS and OD.  Diagnosed by Dr. Loyal Buba, MD, PhD 05/23/13. Also mild macular edema. Follows with Dr. Katy Fitch  . Morbid obesity ([place]) 05/25/2013  . Nephrotic syndrome due to diabetes mellitus ([place]) 04/10/2019  . 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 Shortness of breath 03/22/2019  . Sleep apnea    doesn't have machine yet  . Syncope 04/08/2015  . Tachycardia 05/21/2019  . Type 2 diabetes mellitus with both eyes affected by moderate nonproliferative retinopathy and macular edema, with long-term current use of insulin ([place]) 12/27/2012       . Upper airway cough syndrome 03/22/2019   Past Surgical History: Past Surgical History:  Procedure Laterality Date  . CERVICAL BIOPSY  2004  . CESAREAN SECTION N/A 06/02/2012   Procedure: CESAREAN SECTION;  Surgeon: Mora Bellman, MD;  Location: [facility];  [name]: Obstetrics;  Laterality: N/A;  . RETINAL DETACHMENT REPAIR W/ SCLERAL BUCKLE LE Left 10/08/2019   Medication List:  Current Outpatient Medications  Medication Sig Dispense Refill  . albuterol (VENTOLIN HFA) 108 (90 Base) MCG/ACT inhaler Inhale 1-2 puffs into the lungs every 4 (four) hours as needed for shortness of breath.    . benzonatate (TESSALON) 200 MG capsule Take three times a day until no longer coughing, then take as needed 45 capsule 1  . Blood Pressure Monitoring (BLOOD PRESSURE MONITOR AUTOMAT) DEVI 1 kit by Does not apply route daily. Please call the office if you have any issues obtaining this monitor. 1 each 0  . carvedilol (COREG) 6.25 MG tablet Take 6.25 mg by mouth 2 (two) times daily with a meal.    . Cholecalciferol (VITAMIN D3) 50 MCG (2000 UT) CHEW Chew 2,000 Units by mouth daily. 30 tablet 3  . Continuous Blood Gluc Sensor (DEXCOM G6 SENSOR)  MISC Place one sensor every 10 days. 3 each 11  . Continuous Blood Gluc Transmit (DEXCOM G6 TRANSMITTER) MISC Use every 90 days to transmit information from sensor. 1 each 3  . Dulaglutide (TRULICITY) 7.02 OV/7.8HY SOPN Inject 0.75 mg into the skin once a week. 1 mL 2  . hydroxypropyl methylcellulose / hypromellose (ISOPTO TEARS / GONIOVISC) 2.5 % ophthalmic solution Place 1 drop into both eyes as needed for dry eyes.    . Insulin Aspart FlexPen 100 UNIT/ML SOPN Inject 20-28 Units into the skin 3 (three) times daily with meals. 3 mL 3  . insulin degludec (TRESIBA FLEXTOUCH) 200 UNIT/ML FlexTouch Pen Inject 30 Units into the skin daily. 3 mL 2  . Insulin Degludec (TRESIBA) 100 UNIT/ML SOLN Inject 60 Units into the skin daily.    . Insulin Pen Needle (B-D ULTRAFINE III SHORT PEN) 31G X 8 MM MISC 1 Container by Does not apply route as needed. 1  each 11  . losartan (COZAAR) 100 MG tablet Take 100 mg by mouth daily.    . montelukast (SINGULAIR) 10 MG tablet Take 1 tablet (10 mg total) by mouth at bedtime. 30 tablet 5  . pantoprazole (PROTONIX) 40 MG tablet Take 1 tablet (40 mg total) by mouth daily. 90 tablet 3  . fluticasone-salmeterol (ADVAIR HFA) 115-21 MCG/ACT inhaler Inhale 2 puffs into the lungs 2 (two) times daily. 1 each 3  . insulin lispro (HUMALOG KWIKPEN) 200 UNIT/ML KwikPen INJECT 20 TO 28 UNITS SUBCUTANEOUSLY THREE TIMES DAILY 12 mL 0   No current facility-administered medications for this visit.   Allergies: Allergies  Allergen Reactions  . Lisinopril Cough  . Eggs Or Egg-Derived Products Nausea And Vomiting  . Contrast Media [Iodinated Diagnostic Agents]    Social History: Social History   Socioeconomic History  . Marital status: Single    Spouse name: Not on file  . Number of children: Not on file  . Years of education: Not on file  . Highest education level: Not on file  Occupational History  . Not on file  Tobacco Use  . Smoking status: Never Smoker  . Smokeless  tobacco: Never Used  Vaping Use  . Vaping Use: Never used  Substance and Sexual Activity  . Alcohol use: No  . Drug use: No  . Sexual activity: Not Currently    Birth control/protection: None  Other Topics Concern  . Not on file  Social History Narrative  . Not on file   Social Determinants of Health   Financial Resource Strain: Not on file  Food Insecurity: Not on file  Transportation Needs: Not on file  Physical Activity: Not on file  Stress: Not on file  Social Connections: Not on file   Lives in a house. Smoking: denies Occupation: not employed  Environmental HistoryFreight forwarder in the house: no Charity fundraiser in the family room: no Carpet in the bedroom: yes Heating: electric Cooling: window Pet: 1 dog  Family History: Family History  Problem Relation Age of Onset  . Diabetes Father   . Stomach cancer Father   . Cirrhosis Father   . Cancer Mother   . Diabetes Mother   . Breast cancer Mother   . Rectal cancer Neg Hx   . Esophageal cancer Neg Hx   . Colon cancer Neg Hx    Problem                               Relation Asthma                                   No  Eczema                                No Food allergy                          No  Allergic rhino conjunctivitis     Daughter, siblings  Review of Systems  Constitutional: Negative for appetite change, chills, fever and unexpected weight change.  HENT: Negative for congestion and rhinorrhea.   Eyes: Positive for itching.  Respiratory: Positive for cough, chest tightness, shortness of breath and wheezing.   Cardiovascular: Negative for chest pain.  Gastrointestinal: Negative for abdominal  pain.  Genitourinary: Negative for difficulty urinating.  Skin: Negative for rash.  Allergic/Immunologic: Positive for environmental allergies.  Neurological: Negative for headaches.   Objective: BP 130/88   Pulse 97   Temp 97.7 F (36.5 C)   Resp 18   Ht $R'5\' 2"'vF$  (1.575 m)   Wt 256 lb 12.8 oz (116.5  kg)   SpO2 96%   BMI 46.97 kg/m  Body mass index is 46.97 kg/m. Physical Exam Vitals and nursing note reviewed.  Constitutional:      Appearance: Normal appearance. She is well-developed.  HENT:     Head: Normocephalic and atraumatic.     Right Ear: Tympanic membrane and external ear normal.     Left Ear: Tympanic membrane and external ear normal.     Nose: Nose normal.     Mouth/Throat:     Mouth: Mucous membranes are moist.     Pharynx: Oropharynx is clear.  Eyes:     Conjunctiva/sclera: Conjunctivae normal.  Cardiovascular:     Rate and Rhythm: Normal rate and regular rhythm.     Heart sounds: Normal heart sounds. No murmur heard. No friction rub. No gallop.   Pulmonary:     Effort: Pulmonary effort is normal.     Breath sounds: Normal breath sounds. No wheezing, rhonchi or rales.  Musculoskeletal:     Cervical back: Neck supple.  Skin:    General: Skin is warm.     Findings: No rash.  Neurological:     Mental Status: She is alert and oriented to person, place, and time.  Psychiatric:        Behavior: Behavior normal.    The plan was reviewed with the patient/family, and all questions/concerned were addressed.  It was my pleasure to see Karina [name] today and participate in her care. Please feel free to contact me with any questions or concerns.  Sincerely,  Rexene Alberts, DO Allergy & Immunology  [facility] office: [place] office: [phone]

## 2020-03-17 NOTE — Patient Instructions (Addendum)
Today's skin testing showed: Positive to tree pollen, mold, and borderline to cat and dog.   Environmental allergies  Start environmental control measures as below.  May use over the counter antihistamines such as Zyrtec (cetirizine), Claritin (loratadine), Allegra (fexofenadine), or Xyzal (levocetirizine) daily as needed. Start Singulair (montelukast) 10mg  daily at night. Cautioned that in some children/adults can experience behavioral changes including hyperactivity, agitation, depression, sleep disturbances and suicidal ideations. These side effects are rare, but if you notice them you should notify me and discontinue Singulair (montelukast).  Read about allergy injections - handout given.   Asthma:  Continue to follow up with pulmonology. Daily controller medication(s): continue Advair 2 pufs twice a day with spacer and rinse mouth afterwards. Spacer given and demonstrated proper use with inhaler. Patient understood technique and all questions/concerned were addressed.  May use albuterol rescue inhaler 2 puffs every 4 to 6 hours as needed for shortness of breath, chest tightness, coughing, and wheezing. May use albuterol rescue inhaler 2 puffs 5 to 15 minutes prior to strenuous physical activities. Monitor frequency of use.  Asthma control goals:  Full participation in all desired activities (may need albuterol before activity) Albuterol use two times or less a week on average (not counting use with activity) Cough interfering with sleep two times or less a month Oral steroids no more than once a year No hospitalizations  Follow up in 2 months or sooner if needed.   Reducing Pollen Exposure . Pollen seasons: trees (spring), grass (summer) and ragweed/weeds (fall). 05-20-1982 Keep windows closed in your home and car to lower pollen exposure.  [name] air conditioning in the bedroom and throughout the house if possible.  . Avoid going out in dry windy days - especially early  morning. . Pollen counts are highest between 5 - 10 AM and on dry, hot and windy days.  . Save outside activities for late afternoon or after a heavy rain, when pollen levels are lower.  . Avoid mowing of grass if you have grass pollen allergy. Lilian Kapur Be aware that pollen can also be transported indoors on people and pets.  . Dry your clothes in an automatic dryer rather than hanging them outside where they might collect pollen.  . Rinse hair and eyes before bedtime. Mold Control . Mold and fungi can grow on a variety of surfaces provided certain temperature and moisture conditions exist.  . Outdoor molds grow on plants, decaying vegetation and soil. The major outdoor mold, Alternaria and Cladosporium, are found in very high numbers during hot and dry conditions. Generally, a late summer - fall peak is seen for common outdoor fungal spores. Rain will temporarily lower outdoor mold spore count, but counts rise rapidly when the rainy period ends. . The most important indoor molds are Aspergillus and Penicillium. Dark, humid and poorly ventilated basements are ideal sites for mold growth. The next most common sites of mold growth are the bathroom and the kitchen. Outdoor (Seasonal) Mold Control . Use air conditioning and keep windows closed. . Avoid exposure to decaying vegetation. 11-07-1977 Avoid leaf raking. . Avoid grain handling. . Consider wearing a face mask if working in moldy areas.  Indoor (Perennial) Mold Control  . Maintain humidity below 50%. . Get rid of mold growth on hard surfaces with water, detergent and, if necessary, 5% bleach (do not mix with other cleaners). Then dry the area completely. If mold covers an area more than 10 square feet, consider hiring an [name]. . For  clothing, washing with soap and water is best. If moldy items cannot be cleaned and dried, throw them away. . Remove sources e.g. contaminated carpets. . Repair and seal leaking roofs or pipes.  Using dehumidifiers in damp basements may be helpful, but empty the water and clean units regularly to prevent mildew from forming. All rooms, especially basements, bathrooms and kitchens, require ventilation and cleaning to deter mold and mildew growth. Avoid carpeting on concrete or damp floors, and storing items in damp areas. Pet Allergen Avoidance: . Contrary to popular opinion, there are no "hypoallergenic" breeds of dogs or cats. That is because people are not allergic to an animal's hair, but to an allergen found in the animal's saliva, dander (dead skin flakes) or urine. Pet allergy symptoms typically occur within minutes. For some people, symptoms can build up and become most severe 8 to 12 hours after contact with the animal. People with severe allergies can experience reactions in public places if dander has been transported on the pet owners' clothing. [name] Keeping an animal outdoors is only a partial solution, since homes with pets in the yard still have higher concentrations of animal allergens. . Before getting a pet, ask your allergist to determine if you are allergic to animals. If your pet is already considered part of your family, try to minimize contact and keep the pet out of the bedroom and other rooms where you spend a great deal of time. . As with dust mites, vacuum carpets often or replace carpet with a hardwood floor, tile or linoleum. . High-efficiency particulate air (HEPA) cleaners can reduce allergen levels over time. . While dander and saliva are the source of cat and dog allergens, urine is the source of allergens from rabbits, hamsters, mice and [place] pigs; so ask a non-allergic family member to clean the animal's cage. . If you have a pet allergy, talk to your allergist about the potential for allergy immunotherapy (allergy shots). This strategy can often provide long-term relief.

## 2020-03-18 ENCOUNTER — Ambulatory Visit: Payer: Medicaid Other | Admitting: Family Medicine

## 2020-03-18 ENCOUNTER — Encounter: Payer: Self-pay | Admitting: Allergy

## 2020-03-18 DIAGNOSIS — T781XXD Other adverse food reactions, not elsewhere classified, subsequent encounter: Secondary | ICD-10-CM | POA: Insufficient documentation

## 2020-03-18 DIAGNOSIS — J3089 Other allergic rhinitis: Secondary | ICD-10-CM | POA: Insufficient documentation

## 2020-03-18 DIAGNOSIS — F4325 Adjustment disorder with mixed disturbance of emotions and conduct: Secondary | ICD-10-CM | POA: Diagnosis not present

## 2020-03-18 DIAGNOSIS — R12 Heartburn: Secondary | ICD-10-CM | POA: Insufficient documentation

## 2020-03-18 DIAGNOSIS — J45909 Unspecified asthma, uncomplicated: Secondary | ICD-10-CM | POA: Insufficient documentation

## 2020-03-18 NOTE — Assessment & Plan Note (Signed)
   Continue Protonix 40mg  daily.  Continue dietary and lifestyle modifications.

## 2020-03-18 NOTE — Telephone Encounter (Signed)
The system is not allowing me to do a PA on her inpen.

## 2020-03-18 NOTE — Assessment & Plan Note (Addendum)
Diagnosed with asthma/RAD about 1 year ago after she had COVID-19. Currently on Advair 2 puffs BID and using albuterol about 4 times per week. Main triggers are exertion/stress but concerned about allergies. Follows with pulmonology. PMHx significant for GERD, CKD, insulin dependent DM, HTN, obesity, OSA.  12/25/2019 IgE 676; eos 100, H&H 9.6/29  Continue to follow up with pulmonology. Daily controller medication(s): continue Advair 2 puffs twice a day with spacer and rinse mouth afterwards. Start Singulair 10mg  daily at night as above. Spacer given and demonstrated proper use with inhaler. Patient understood technique and all questions/concerned were addressed.  May use albuterol rescue inhaler 2 puffs every 4 to 6 hours as needed for shortness of breath, chest tightness, coughing, and wheezing. May use albuterol rescue inhaler 2 puffs 5 to 15 minutes prior to strenuous physical activities. Monitor frequency of use.

## 2020-03-18 NOTE — Assessment & Plan Note (Signed)
Perennial rhinoconjunctivitis symptoms for 10+ years with worsening in the spring and fall.  Tried Allegra with good benefit.  No prior allergy/ENT evaluation.  Today's skin testing showed: Positive to tree pollen, mold, and borderline to cat and dog.   Start environmental control measures as below.  May use over the counter antihistamines such as Zyrtec (cetirizine), Claritin (loratadine), Allegra (fexofenadine), or Xyzal (levocetirizine) daily as needed. Start Singulair (montelukast) 10mg  daily at night. Cautioned that in some children/adults can experience behavioral changes including hyperactivity, agitation, depression, sleep disturbances and suicidal ideations. These side effects are rare, but if you notice them you should notify me and discontinue Singulair (montelukast).  Read about allergy injections - handout given.

## 2020-03-18 NOTE — Progress Notes (Deleted)
    SUBJECTIVE:   CHIEF COMPLAINT / HPI:   T2DM Current regimen: Trulicity 0.75, Tresiba 30 units daily, Fiasp 20 units with meals Now has CGM, working with pharmacy team CBGs *** Last BMP 12/25/2019, creatinine 1.79 at that time Last A1c 11.5 on 12/27 Diet***  CKD stage IIIb Last creatinine 1.79 on 12/14 Patient has been previously referred to nephrology ***  Normocytic anemia Last hemoglobin 9.6 on 12/25/2019 For the last year, it appears she has been in the 9-11 range No recent iron or B12 noted in chart Bleeding*** Denies chest pain, shortness of breath, dizziness upon standing***     PERTINENT  PMH / PSH: ***  OBJECTIVE:   There were no vitals taken for this visit.  ***  ASSESSMENT/PLAN:   No problem-specific Assessment & Plan notes found for this encounter.     Unknown Jim, DO [facility]   {    This will disappear when note is signed, click to select method of visit    :1}

## 2020-03-18 NOTE — Assessment & Plan Note (Signed)
Currently avoiding kiwi, pineapple and eggs as they caused vomiting in the past. Tolerates baked eggs with no issues.   Continue to avoid above foods - consider skin testing in future.   For mild symptoms you can take over the counter antihistamines such as Benadryl and monitor symptoms closely. If symptoms worsen or if you have severe symptoms including breathing issues, throat closure, significant swelling, whole body hives, severe diarrhea and vomiting, lightheadedness then seek immediate medical care.

## 2020-03-18 NOTE — Telephone Encounter (Signed)
It is in your mailbox

## 2020-03-19 ENCOUNTER — Telehealth: Payer: Self-pay | Admitting: Family Medicine

## 2020-03-19 NOTE — Telephone Encounter (Signed)
Reached out to Ms.Batta today to see about getting her rescheduled for her phone visit she missed with the [facility] on the Managed Medicaid Team. I left my contact info for her to call me back. I will reach out again if I don't hear back from her.

## 2020-03-21 ENCOUNTER — Encounter: Payer: Medicaid Other | Attending: Family Medicine | Admitting: Dietician

## 2020-03-21 ENCOUNTER — Encounter: Payer: Self-pay | Admitting: Dietician

## 2020-03-21 ENCOUNTER — Other Ambulatory Visit: Payer: Self-pay

## 2020-03-21 DIAGNOSIS — Z794 Long term (current) use of insulin: Secondary | ICD-10-CM | POA: Diagnosis not present

## 2020-03-21 DIAGNOSIS — E113313 Type 2 diabetes mellitus with moderate nonproliferative diabetic retinopathy with macular edema, bilateral: Secondary | ICD-10-CM | POA: Diagnosis not present

## 2020-03-21 NOTE — Patient Instructions (Addendum)
Omnipod.com (Omnipod DASH insulin pump) www.medtronicdiabetes.com/products/inpen-smart-insulin-pen-system (InPen) Calorie King app  Glucose goals: 80-130 fasting Less than 180 2 hours after you eat a meal. (Difference of of 40-60 points.)  1.  Stay active.  Start with 15 minutes most days and increase to 30 minutes.  Break the exercise up during the day if you need. Walking most days and strength exercises about 3 times weekly.  2.  Small amounts of protein with each meal and snack but do not over eat protein due to your kidney function.  55-60 grams per day from all foods is sufficient due to your kidney function.  3.  Focus on nutrition quality.  Eat to live.  4.  Mindful eating  Choice  Portion sizes  Eat slowly, chew your food 30 times, stop when satisfied.  Avoid eating with distraction.  When you are stressed, what can you do rather than eat? (walk, go outside, meditate)  5,  You will need to avoid carbonated beverages post op  6.  Think about your snack choices.  You are not taking insulin for these so the carbohydrate content should be lower (less than 15 grams).  Raw vegetables are a good choice.  Consider lite bread

## 2020-03-21 NOTE — Progress Notes (Signed)
Diabetes Self-Management Education  Visit Type: First/Initial  Appt. Start Time: 1330 Appt. End Time: 1610 Patient arrived late.  03/21/2020  [name], identified by name and date of birth, is a 35 y.o. female with a diagnosis of Diabetes: Type 2.   ASSESSMENT Patient is here today alone.  Patient arrived late.  She last was seen by and RD in this office in 2014 for insulin pump training.  She also sees an RD at [facility] 02/2020 for their Bariatric Program and is followed by a pharmacist at [facility] weekly.  She has done NOOM in the past for about a year as well as went to a naturopathic practitioner who taught mindfulness.She states that she is planning to have bariatric surgery sometime this summer (Sleeve gastrectomy).   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  She would like to learn more about portions and what to eat during this visit.  She states that she would like to practice healthy eating prior to surgery  So that it will not be such a shock post surgery. She needs to get her A1C down prior to being accepted for surgery. Patient is considering intermittent fasting and would like more information about this.  Currently she is trying to eat in a 12 hour window. States that she feels her diet is an issues and needs to see food as fuel.  She also struggles with emotional eating. States that she is trying to be consistent with her medication.  Knows that if her blood sugar is high it will make her feel worse. She also would like to consider ASpire rather than C-pap but her current weight is too high to qualify. She knows how to count carbohydrates but has not been doing this recently.  She has not been tracking her intake. She had a Medtronic insulin pump in the past (during pregnancy) and asked questions about the Omnipod and InPen.  Resources provided.  Showed patient the POD for the omnipod.  History includes Type 2 Diabetes, retinopathy, macular edema,  HTN, CKD, GERD, OSA, asthma Medications include:  Trulicy, Fiasp 96-04 units before each meal depending on her blood glucose, Tresiba 30 units q HS. She is using [place] currently but her insurance does not cover this so she will be changing to the Dexcom.  She does not prefer the dexcom and has used this before. Labs:  A1C 11.5% 01/07/2020 decreased from 14.3% 10/27/2018, BUN 19, Creatinine 1.79, Potassium 4.5, eGFR 36 12/25/18-21 Sleep:  Trouble falling asleep, restless interrupted sleep, sleeps only 3-4 hours per night, and intermittently falls asleep during the day.  Energy is poor.  Weight: 262 lbs 03/21/20  Patient lives with others including her 9 yo daughter.  Patient does the shopping and cooking.  Allergic to eggs, cooks without salt, use of rice is rare.  She is unemployed. 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  Height $Remov'5\' 2"'aBFaeN$  (1.575 m), weight 262 lb (118.8 kg). Body mass index is 47.92 kg/m.   Diabetes Self-Management Education - 03/21/20 1555      Visit Information   Visit Type First/Initial      Initial Visit   Diabetes Type Type 2    Are you currently following a meal plan? Yes    What type of meal plan do you follow? prebariatric/diabetic    Are you taking your medications as prescribed? Yes    Date Diagnosed 2004  Health Coping   How would you rate your overall health? Fair      Psychosocial Assessment   Patient Belief/Attitude about Diabetes Motivated to manage diabetes    Self-care barriers None    Self-management support Doctor's office    Other persons present Patient    Patient Concerns Nutrition/Meal planning;Glycemic Control;Weight Control;Healthy Lifestyle    Special Needs Large print    Preferred Learning Style No preference indicated    Learning Readiness Ready    How often do you need to have someone help you when you read instructions, pamphlets, or other written materials from your doctor or pharmacy? 1 -  Never    What is the last grade level you completed in school? Bachelor's      Pre-Education Assessment   Patient understands the diabetes disease and treatment process. Needs Review    Patient understands incorporating nutritional management into lifestyle. Needs Review    Patient undertands incorporating physical activity into lifestyle. Needs Review    Patient understands using medications safely. Needs Review    Patient understands monitoring blood glucose, interpreting and using results Needs Review    Patient understands prevention, detection, and treatment of acute complications. Needs Review    Patient understands prevention, detection, and treatment of chronic complications. Needs Review    Patient understands how to develop strategies to address psychosocial issues. Needs Review    Patient understands how to develop strategies to promote health/change behavior. Needs Review      Complications   Last HgB A1C per patient/outside source 11.5 %   01/07/2020 decreased from 14.3% 10/27/2018   How often do you check your blood sugar? > 4 times/day    Fasting Blood glucose range (mg/dL) >200   200-250   Postprandial Blood glucose range (mg/dL) >200   270-280 when she eats bread and meat together   Number of hypoglycemic episodes per month 0    Number of hyperglycemic episodes per week 21    Can you tell when your blood sugar is high? Yes   worrsoned blurry vision   What do you do if your blood sugar is high? drinks lots of water, short walk    Have you had a dilated eye exam in the past 12 months? Yes    Have you had a dental exam in the past 12 months? Yes    Are you checking your feet? Yes    How many days per week are you checking your feet? 4      Dietary Intake   Snack (morning) apple   apple   Lunch mini [place] Mike[employer] sup OR 1/2 of a regular sub   1200   Snack (afternoon) rest of sub at 2:00, torilla chips and salsa, raw vegetables at 4:00   2:00 adn 4:00   Dinner baked  chicken, vegetable, occasional potatoes, sometimes bread   6   Snack (evening) raw vegetables, chips   chips, vegetables   Beverage(s) alkaline water, selzer water, occasional regular gingerale (4-6 oz), 100% juice (splash) with unsweetened tea      Exercise   Exercise Type Light (walking / raking leaves)   walking, kettle bell, dumb bell, resistance bands   How many days per week to you exercise? 3    How many minutes per day do you exercise? 15    Total minutes per week of exercise 45      Patient Education   Previous Diabetes Education Yes (please comment)   see note  Disease state  Other (comment)   review of insulin resistance   Nutrition management  Meal options for control of blood glucose level and chronic complications.;Meal timing in regards to the patients' current diabetes medication.;Role of diet in the treatment of diabetes and the relationship between the three main macronutrients and blood glucose level    Physical activity and exercise  Role of exercise on diabetes management, blood pressure control and cardiac health.    Medications Reviewed patients medication for diabetes, action, purpose, timing of dose and side effects.    Monitoring Taught/discussed recording of test results and interpretation of SMBG.;Identified appropriate SMBG and/or A1C goals.;Daily foot exams;Yearly dilated eye exam    Chronic complications Relationship between chronic complications and blood glucose control    Psychosocial adjustment Worked with patient to identify barriers to care and solutions;Role of stress on diabetes      Individualized Goals (developed by patient)   Nutrition General guidelines for healthy choices and portions discussed    Physical Activity Exercise 5-7 days per week;30 minutes per day    Medications take my medication as prescribed    Monitoring  test my blood glucose as discussed    Problem Solving balanced meals    Reducing Risk examine blood glucose patterns;do foot  checks daily;treat hypoglycemia with 15 grams of carbs if blood glucose less than $RemoveB'70mg'ULAvFPay$ /dL    Health Coping discuss diabetes with (comment)   MD, RD, MPH, CDCES     Post-Education Assessment   Patient understands the diabetes disease and treatment process. Demonstrates understanding / competency    Patient understands incorporating nutritional management into lifestyle. Needs Review    Patient undertands incorporating physical activity into lifestyle. Needs Review    Patient understands using medications safely. Demonstrates understanding / competency    Patient understands monitoring blood glucose, interpreting and using results Demonstrates understanding / competency    Patient understands prevention, detection, and treatment of acute complications. Demonstrates understanding / competency    Patient understands prevention, detection, and treatment of chronic complications. Demonstrates understanding / competency    Patient understands how to develop strategies to address psychosocial issues. Demonstrates understanding / competency    Patient understands how to develop strategies to promote health/change behavior. Needs Review      Outcomes   Expected Outcomes Demonstrated interest in learning. Expect positive outcomes    Future DMSE PRN    Program Status Completed           Individualized Plan for Diabetes Self-Management Training:   Learning Objective:  Patient will have a greater understanding of diabetes self-management. Patient education plan is to attend individual and/or group sessions per assessed needs and concerns.   Plan:   Patient Instructions  Omnipod.com (Omnipod DASH insulin pump) www.medtronicdiabetes.com/products/inpen-smart-insulin-pen-system (InPen) Calorie King app  Glucose goals: 80-130 fasting Less than 180 2 hours after you eat a meal. (Difference of of 40-60 points.)  1.  Stay active.  Start with 15 minutes most days and increase to 30 minutes.  Break  the exercise up during the day if you need. Walking most days and strength exercises about 3 times weekly.  2.  Small amounts of protein with each meal and snack but do not over eat protein due to your kidney function.  55-60 grams per day from all foods is sufficient due to your kidney function.  3.  Focus on nutrition quality.  Eat to live.  4.  Mindful eating  Choice  Portion sizes  Eat slowly, chew your food  30 times, stop when satisfied.  Avoid eating with distraction.  When you are stressed, what can you do rather than eat? (walk, go outside, meditate)  5,  You will need to avoid carbonated beverages post op  6.  Think about your snack choices.  You are not taking insulin for these so the carbohydrate content should be lower (less than 15 grams).  Raw vegetables are a good choice.  Consider lite bread   Expected Outcomes:  Demonstrated interest in learning. Expect positive outcomes  Education material provided: Meal plan card and Snack sheet  If problems or questions, patient to contact team via:  Phone  Future DSME appointment: PRN

## 2020-03-28 ENCOUNTER — Ambulatory Visit: Payer: Medicaid Other | Admitting: Podiatry

## 2020-03-31 ENCOUNTER — Telehealth: Payer: Self-pay | Admitting: Pharmacist

## 2020-03-31 NOTE — Telephone Encounter (Signed)
Called patient to discuss Inpen status. Patient states she has not heard from [facility]. I will reach out to company to figure out how long the process for approval takes. Patient has appt scheduled for 3/23 to review blood glucose readings.

## 2020-04-01 ENCOUNTER — Other Ambulatory Visit: Payer: Self-pay | Admitting: Primary Care

## 2020-04-02 ENCOUNTER — Ambulatory Visit: Payer: Medicaid Other | Admitting: Pharmacist

## 2020-04-03 ENCOUNTER — Telehealth: Payer: Self-pay | Admitting: *Deleted

## 2020-04-03 NOTE — Telephone Encounter (Signed)
Received fax from pharmacy, PA needed on Tresiba flexpen 200units/ml.  Clinical questions submitted via Cover My Meds.  Waiting on response, could take up to 72 hours.  Cover My Meds info: Key: [id number]  Jone Baseman, CMA

## 2020-04-03 NOTE — Telephone Encounter (Signed)
Approved, Coverage Starts on: 04/03/2020 12:00:00 AM, Coverage Ends on: 04/03/2021 12:00:00 AM.  Pharmacy informed. Jone Baseman, CMA

## 2020-04-07 DIAGNOSIS — Z713 Dietary counseling and surveillance: Secondary | ICD-10-CM | POA: Diagnosis not present

## 2020-04-10 ENCOUNTER — Telehealth: Payer: Self-pay | Admitting: Pharmacist

## 2020-04-10 MED ORDER — INPEN 100-PINK-NOVO DEVI: 0 refills | Status: AC

## 2020-04-10 NOTE — Telephone Encounter (Cosign Needed)
Pharmacy called and stated that they were unable to complete transfer of inpen prescription from [facility], as [facility] stated they did not have it. Re-sent prescription for inpen device.

## 2020-04-11 ENCOUNTER — Other Ambulatory Visit: Payer: Self-pay

## 2020-04-11 ENCOUNTER — Ambulatory Visit: Payer: Medicaid Other | Admitting: Podiatry

## 2020-04-11 DIAGNOSIS — M722 Plantar fascial fibromatosis: Secondary | ICD-10-CM | POA: Diagnosis not present

## 2020-04-14 ENCOUNTER — Ambulatory Visit: Payer: Medicaid Other | Admitting: Pharmacist

## 2020-04-15 ENCOUNTER — Encounter: Payer: Self-pay | Admitting: Podiatry

## 2020-04-15 NOTE — Progress Notes (Signed)
Subjective:  Patient ID: Karina [name], female    DOB: Feb 09, 1985,  MRN: [id number]  Chief Complaint  Patient presents with  . Plantar Fasciitis    PT stated that she is doing okay     35 y.o. female presents with the above complaint.  Patient presents to follow-up with bilateral plantar fasciitis.  She states she is doing a lot better she does not have any pain.  The braces do help.  She denies any other acute complaints.  She would like to discuss any further treatment options   Review of Systems: Negative except as noted in the HPI. Denies N/V/F/Ch.  Past Medical History:  Diagnosis Date  . Acute pain of right shoulder 03/13/2019  . Allergy   . Angio-edema   . Asthma   . Back pain 12/12/2017  . Carpal tunnel syndrome of right wrist 03/13/2019  . COVID-19 01/24/2019  . Depression 12/27/2012  . Diabetes mellitus    Type 2, insulin resistant  . Diabetic neuropathy, type II diabetes mellitus (HCC) 05/23/2013  . DOE (dyspnea on exertion) 05/21/2019   Onset with covid 19 infection symptoms started 01/16/19  -  05/23/2019   Walked RA  3 laps @ approx 247ft each @ moderat pace  stopped due to end of study,  no limiting sob/ sats 100% at end despite reported sob at rest   . Essential hypertension, benign 12/12/2013  . GERD (gastroesophageal reflux disease)   . History of COVID-19 03/22/2019  . Lower extremity edema 08/29/2017  . Moderate nonproliferative diabetic retinopathy (HCC) 05/24/2013   Both OS and OD.  Diagnosed by Dr. Altamease Oiler, MD, PhD 05/23/13. Also mild macular edema. Follows with Dr. Dione Booze  . Morbid obesity (HCC) 05/25/2013  . Nephrotic syndrome due to diabetes mellitus (HCC) 04/10/2019  . 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 Shortness of breath 03/22/2019  . Sleep apnea    doesn't have machine yet  . Syncope 04/08/2015  . Tachycardia 05/21/2019  . 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 Upper airway cough syndrome 03/22/2019    Current Outpatient Medications:  .  albuterol (VENTOLIN HFA) 108 (90 Base) MCG/ACT inhaler, Inhale 1-2 puffs into the lungs every 4 (four) hours as needed for shortness of breath., Disp: , Rfl:  .  benzonatate (TESSALON) 200 MG capsule, Take three times a day until no longer coughing, then take as needed, Disp: 45 capsule, Rfl: 1 .  Blood Pressure Monitoring (BLOOD PRESSURE MONITOR AUTOMAT) DEVI, 1 kit by Does not apply route daily. Please call the office if you have any issues obtaining this monitor., Disp: 1 each, Rfl: 0 .  carvedilol (COREG) 6.25 MG tablet, Take 6.25 mg by mouth 2 (two) times daily with a meal., Disp: , Rfl:  .  Cholecalciferol (VITAMIN D3) 50 MCG (2000 UT) CHEW, Chew 2,000 Units by mouth daily., Disp: 30 tablet, Rfl: 3 .  Continuous Blood Gluc Sensor (DEXCOM G6 SENSOR) MISC, Place one sensor every 10 days., Disp: 3 each, Rfl: 11 .  Continuous Blood Gluc Transmit (DEXCOM G6 TRANSMITTER) MISC, Use every 90 days to transmit information from sensor., Disp: 1 each, Rfl: 3 .  Dulaglutide (TRULICITY) 0.75 MG/0.5ML SOPN, Inject 0.75 mg into the skin once a week., Disp: 1 mL, Rfl: 2 .  fluticasone-salmeterol (ADVAIR HFA) 115-21 MCG/ACT inhaler, Inhale 2 puffs into the lungs 2 (two) times daily., Disp:  1 each, Rfl: 3 .  hydroxypropyl methylcellulose / hypromellose (ISOPTO TEARS / GONIOVISC) 2.5 % ophthalmic solution, Place 1 drop into both eyes as needed for dry eyes., Disp: , Rfl:  .  injection device for insulin (INPEN 100-PINK-NOVO) DEVI, Use as directed with novolog cartridges, Disp: 1 each, Rfl: 0 .  Insulin Aspart FlexPen 100 UNIT/ML SOPN, Inject 20-28 Units into the skin 3 (three) times daily with meals. (Patient not taking: Reported on 03/21/2020), Disp: 3 mL, Rfl: 3 .  Insulin Aspart, w/Niacinamide, (FIASP PENFILL Bantry), Inject 10-20 Units into the skin. Before meals, Disp: , Rfl:  .  insulin degludec (TRESIBA  FLEXTOUCH) 200 UNIT/ML FlexTouch Pen, Inject 30 Units into the skin daily., Disp: 3 mL, Rfl: 2 .  Insulin Degludec (TRESIBA) 100 UNIT/ML SOLN, Inject 60 Units into the skin daily., Disp: , Rfl:  .  insulin lispro (HUMALOG KWIKPEN) 200 UNIT/ML KwikPen, INJECT 20 TO 28 UNITS SUBCUTANEOUSLY THREE TIMES DAILY (Patient not taking: Reported on 03/21/2020), Disp: 12 mL, Rfl: 0 .  Insulin Pen Needle (B-D ULTRAFINE III SHORT PEN) 31G X 8 MM MISC, 1 Container by Does not apply route as needed., Disp: 1 each, Rfl: 11 .  losartan (COZAAR) 100 MG tablet, Take 100 mg by mouth daily., Disp: , Rfl:  .  montelukast (SINGULAIR) 10 MG tablet, Take 1 tablet (10 mg total) by mouth at bedtime. (Patient not taking: Reported on 03/21/2020), Disp: 30 tablet, Rfl: 5 .  pantoprazole (PROTONIX) 40 MG tablet, Take 1 tablet (40 mg total) by mouth daily., Disp: 90 tablet, Rfl: 3  Social History   Tobacco Use  Smoking Status Never Smoker  Smokeless Tobacco Never Used    Allergies  Allergen Reactions  . Lisinopril Cough  . Eggs Or Egg-Derived Products Nausea And Vomiting  . Contrast Media [Iodinated Diagnostic Agents]    Objective:  There were no vitals filed for this visit. There is no height or weight on file to calculate BMI. Constitutional Well developed. Well nourished.  Vascular Dorsalis pedis pulses palpable bilaterally. Posterior tibial pulses palpable bilaterally. Capillary refill normal to all digits.  No cyanosis or clubbing noted. Pedal hair growth normal.  Neurologic Normal speech. Oriented to person, place, and time. Epicritic sensation to light touch grossly present bilaterally.  Dermatologic Nails well groomed and normal in appearance. No open wounds. No skin lesions.  Orthopedic: Normal joint ROM without pain or crepitus bilaterally. No visible deformities. No tender to palpation at the calcaneal tuber bilaterally. No pain with calcaneal squeeze bilaterally. Ankle ROM diminished range of  motion bilaterally. Silfverskiold Test: positive bilaterally.   Radiographs: None  Assessment:   1. Plantar fasciitis of right foot   2. Plantar fasciitis of left foot    Plan:  Patient was evaluated and treated and all questions answered.  Plantar Fasciitis, bilaterally with underlying ankle equinus - Clinically healed.  I discussed with the patient the importance of shoe gear modification continues wearing of orthotics.  Patient has obtained orthotics through [employer].  I have encouraged her to continue wearing them.  She states understanding and will do that.  No follow-ups on file.

## 2020-04-16 DIAGNOSIS — E113551 Type 2 diabetes mellitus with stable proliferative diabetic retinopathy, right eye: Secondary | ICD-10-CM | POA: Diagnosis not present

## 2020-04-16 DIAGNOSIS — H2512 Age-related nuclear cataract, left eye: Secondary | ICD-10-CM | POA: Diagnosis not present

## 2020-04-16 DIAGNOSIS — H0288A Meibomian gland dysfunction right eye, upper and lower eyelids: Secondary | ICD-10-CM | POA: Diagnosis not present

## 2020-04-16 DIAGNOSIS — Z794 Long term (current) use of insulin: Secondary | ICD-10-CM | POA: Diagnosis not present

## 2020-04-16 DIAGNOSIS — E113522 Type 2 diabetes mellitus with proliferative diabetic retinopathy with traction retinal detachment involving the macula, left eye: Secondary | ICD-10-CM | POA: Diagnosis not present

## 2020-04-16 DIAGNOSIS — H0288B Meibomian gland dysfunction left eye, upper and lower eyelids: Secondary | ICD-10-CM | POA: Diagnosis not present

## 2020-04-16 DIAGNOSIS — E1136 Type 2 diabetes mellitus with diabetic cataract: Secondary | ICD-10-CM | POA: Diagnosis not present

## 2020-04-23 DIAGNOSIS — N1832 Chronic kidney disease, stage 3b: Secondary | ICD-10-CM | POA: Diagnosis not present

## 2020-04-23 DIAGNOSIS — E1165 Type 2 diabetes mellitus with hyperglycemia: Secondary | ICD-10-CM | POA: Diagnosis not present

## 2020-04-23 DIAGNOSIS — I10 Essential (primary) hypertension: Secondary | ICD-10-CM | POA: Diagnosis not present

## 2020-04-23 DIAGNOSIS — Z6841 Body Mass Index (BMI) 40.0 and over, adult: Secondary | ICD-10-CM | POA: Diagnosis not present

## 2020-04-23 DIAGNOSIS — E7800 Pure hypercholesterolemia, unspecified: Secondary | ICD-10-CM | POA: Diagnosis not present

## 2020-04-23 DIAGNOSIS — E559 Vitamin D deficiency, unspecified: Secondary | ICD-10-CM | POA: Diagnosis not present

## 2020-04-23 DIAGNOSIS — G4733 Obstructive sleep apnea (adult) (pediatric): Secondary | ICD-10-CM | POA: Diagnosis not present

## 2020-04-29 ENCOUNTER — Telehealth: Payer: Self-pay | Admitting: Family Medicine

## 2020-04-29 NOTE — Telephone Encounter (Signed)
Today was 2nd attempt to reach Karina [name] to get her rescheduled for a phone visit with the Managed Medicaid RNCM. I left my name and number for her to return my call.

## 2020-05-01 DIAGNOSIS — Z6841 Body Mass Index (BMI) 40.0 and over, adult: Secondary | ICD-10-CM | POA: Diagnosis not present

## 2020-05-01 DIAGNOSIS — I10 Essential (primary) hypertension: Secondary | ICD-10-CM | POA: Diagnosis not present

## 2020-05-01 DIAGNOSIS — G4733 Obstructive sleep apnea (adult) (pediatric): Secondary | ICD-10-CM | POA: Diagnosis not present

## 2020-05-01 DIAGNOSIS — E1142 Type 2 diabetes mellitus with diabetic polyneuropathy: Secondary | ICD-10-CM | POA: Diagnosis not present

## 2020-05-01 DIAGNOSIS — E11319 Type 2 diabetes mellitus with unspecified diabetic retinopathy without macular edema: Secondary | ICD-10-CM | POA: Diagnosis not present

## 2020-05-01 DIAGNOSIS — E1165 Type 2 diabetes mellitus with hyperglycemia: Secondary | ICD-10-CM | POA: Diagnosis not present

## 2020-05-01 DIAGNOSIS — E1121 Type 2 diabetes mellitus with diabetic nephropathy: Secondary | ICD-10-CM | POA: Diagnosis not present

## 2020-05-01 DIAGNOSIS — K219 Gastro-esophageal reflux disease without esophagitis: Secondary | ICD-10-CM | POA: Diagnosis not present

## 2020-05-01 DIAGNOSIS — E559 Vitamin D deficiency, unspecified: Secondary | ICD-10-CM | POA: Diagnosis not present

## 2020-05-08 DIAGNOSIS — Z713 Dietary counseling and surveillance: Secondary | ICD-10-CM | POA: Diagnosis not present

## 2020-05-08 DIAGNOSIS — G4733 Obstructive sleep apnea (adult) (pediatric): Secondary | ICD-10-CM | POA: Diagnosis not present

## 2020-05-14 DIAGNOSIS — E669 Obesity, unspecified: Secondary | ICD-10-CM | POA: Diagnosis not present

## 2020-05-14 DIAGNOSIS — N1831 Chronic kidney disease, stage 3a: Secondary | ICD-10-CM | POA: Diagnosis not present

## 2020-05-14 DIAGNOSIS — E1122 Type 2 diabetes mellitus with diabetic chronic kidney disease: Secondary | ICD-10-CM | POA: Diagnosis not present

## 2020-05-14 DIAGNOSIS — I129 Hypertensive chronic kidney disease with stage 1 through stage 4 chronic kidney disease, or unspecified chronic kidney disease: Secondary | ICD-10-CM | POA: Diagnosis not present

## 2020-05-22 ENCOUNTER — Other Ambulatory Visit: Payer: Self-pay | Admitting: Family Medicine

## 2020-05-22 DIAGNOSIS — IMO0002 Reserved for concepts with insufficient information to code with codable children: Secondary | ICD-10-CM

## 2020-05-22 DIAGNOSIS — E1165 Type 2 diabetes mellitus with hyperglycemia: Secondary | ICD-10-CM

## 2020-05-22 NOTE — Telephone Encounter (Signed)
Patient needs follow up for future refills. Please schedule with Cyndi Bender.

## 2020-05-23 DIAGNOSIS — E559 Vitamin D deficiency, unspecified: Secondary | ICD-10-CM | POA: Diagnosis not present

## 2020-05-26 ENCOUNTER — Ambulatory Visit (INDEPENDENT_AMBULATORY_CARE_PROVIDER_SITE_OTHER): Payer: Medicaid Other | Admitting: Pharmacist

## 2020-05-26 ENCOUNTER — Other Ambulatory Visit: Payer: Self-pay

## 2020-05-26 DIAGNOSIS — N182 Chronic kidney disease, stage 2 (mild): Secondary | ICD-10-CM

## 2020-05-26 DIAGNOSIS — Z794 Long term (current) use of insulin: Secondary | ICD-10-CM | POA: Diagnosis not present

## 2020-05-26 DIAGNOSIS — F4325 Adjustment disorder with mixed disturbance of emotions and conduct: Secondary | ICD-10-CM | POA: Diagnosis not present

## 2020-05-26 DIAGNOSIS — E113313 Type 2 diabetes mellitus with moderate nonproliferative diabetic retinopathy with macular edema, bilateral: Secondary | ICD-10-CM

## 2020-05-26 DIAGNOSIS — I129 Hypertensive chronic kidney disease with stage 1 through stage 4 chronic kidney disease, or unspecified chronic kidney disease: Secondary | ICD-10-CM | POA: Diagnosis not present

## 2020-05-26 DIAGNOSIS — E1122 Type 2 diabetes mellitus with diabetic chronic kidney disease: Secondary | ICD-10-CM

## 2020-05-26 NOTE — Progress Notes (Signed)
Subjective:    Patient ID: Karina [name], female    DOB: 07/12/1985, 35 y.o.   MRN: [id number]  HPI Patient is a 35 y.o. female who presents for diabetes management and Dexcom application. She is in good spirits and presents without assistance. Patient was referred on 01/07/20 and last seen by provider, Dr. Sandi Carne on 02/06/20.  Patient states she has been struggling with her blood glucose readings and knows she needs to make improvements. She recently had an A1C check with [facility] on 05/23/20 which was 12.8% and she expressed she was not happy about this. She reports just recently making big improvements in her lifestyle (diet and exercise), which she hopes will help her with her blood glucose.  Patient reports diabetes was diagnosed in 2004.   Insurance coverage/medication affordability: Medicaid  Patient reports adherence with medications.   Current diabetes medications include: insulin lispro (Humalog) 20-28 units (patient reports taking 10-12 units TID because it is 200 unit/mL), insulin degludec [name]) 40 units once daily (patient reports taking 20 units because it is 176 unit/mL), trulicity 0.75mg  once weekly Current hypertension medications include: carvedilol 6.25mg  BID, losartan 100mg  Current hyperlipidemia medications include: none Patient states that she is not taking her medications as prescribed because she decreased her insulin doses based on picking up the 280mL/unit pens as opposed to the 100unit/mL pens. Patient reports adherence with medications. Patient states that she previously struggled with remembering to take her Humalog dose with lunch but sets an alarm now which has helped tremendously. Patient also has been off of Trulicity for 2-3 weeks and she cannot recall the reason why, but states it was not due to any side effects and that she had previously been tolerating the medication well.  Does you feel that your medications are working for you?  yes  Have you  been experiencing any side effects to the medications prescribed? Experienced swelling while on singulair so she stopped taking and swelling has minimized  Do you have any problems obtaining medications due to transportation or finances?  no    Patient reported dietary habits:  Eats 3 meals/day and 2 snacks/day; Boluses with Humalog 29meals/day Breakfast: smoothie (1-2x/week) and on other days will have fruit, breakfast meat, and biscuit Lunch: wrap or low carb bread (meat, cheese, veggies) sometimes with fruit Dinner: cauliflower rice, veggie pasta, protein Snacks: protein (cheese), veggies Drinks: unsweetened tea, water, low calorie body armour  Patient-reported exercise habits: resistance training with physical therapy   Patient reports hypoglycemic event yesterday where she felt unwell and did not check her blood glucose right away but instead ate two pieces of fruit and then checked and it was 93 "so I know it had to have been low before that."  Patient reports polyuria (increased urination).  Patient reports polyphagia (increased appetite).  Patient reports polydipsia (increased thirst).  Patient reports neuropathy (nerve pain). Patient reports visual changes. Patient reports self foot exams.   Dexcom G6 patient education Patient taking >1 gram acetaminophen every 6 hours: denies Patient taking hydroxyrea: denies  Sensor application -- patient placed sensor on stomach 1. Site selection and site prep with alcohol pad 2. Sensor prep-sensor pack and sensor applicator 3. Sensor applied to area away from waistband, scarring, tattoos, irritation, and bones 4. Transmitter sanitized with alcohol pad and inserted into sensor. 5. Starting the sensor: 2 hour warm up before BG readings available 6. Sensor change every 10 days and rotate site 7. Call Dexcom customer service if sensor comes off before 10  days  Safety and Troubleshooting 1. Do a fingerstick blood glucose test if the sensor  readings do not match how you feel 2. Remove sensor prior to magnetic resonance imaging (MRI), computed tomography (CT) scan, or high-frequency electrical heat (diathermy) treatment. 3. Do not allow sun screen or insect repellant to come into contact with Dexcom G6. These skin care products may lead for the plastic used in the Dexcom G6 to crack. 4. Dexcom G6 may be worn through a Environmental education officer. It may not be exposed to an advanced Imaging Technology (AIT) body scanner (also called a millimeter wave scanner) or the baggage x-ray machine. Instead, ask for hand-wanding or full-body pat-down and visual inspection.  5. Doses of acetaminophen (Tylenol) >1 gram every 6 hours may cause false high readings. 6. Hydroxyurea (Hydrea, Droxia) may interfere with accuracy of blood glucose readings from Dexcom G6. 7. Store sensor kit between 36 and 86 degrees Farenheit. Can be refrigerated within this temperature range.  Contact information provided for [facility] customer service and/or trainer.  Patient reported home blood pressure readings: 120's/70's mmHg   Objective:    Labs:   Lab Results  Component Value Date   HGBA1C 11.5 (A) 01/07/2020   HGBA1C 10.8 (A) 10/02/2019   HGBA1C 12.2 (A) 06/21/2019    Vitals:   05/26/20 1547  BP: 140/80  Pulse: 90  SpO2: 98%    Lab Results  Component Value Date   MICRALBCREAT >300 08/25/2018    Lipid Panel     Component Value Date/Time   CHOL 208 (H) 09/14/2018 1502   TRIG 190 (H) 09/14/2018 1502   HDL 52 09/14/2018 1502   CHOLHDL 4.0 09/14/2018 1502   CHOLHDL 3.4 03/21/2015 1230   VLDL 30 03/21/2015 1230   LDLCALC 123 (H) 09/14/2018 1502    Clinical Atherosclerotic Cardiovascular Disease (ASCVD): No  The ASCVD Risk score Mikey Bussing DC Jr., et al., 2013) failed to calculate for the following reasons:   The 2013 ASCVD risk score is only valid for ages 73 to 3   PHQ-9 Score: did not complete  Assessment/Plan:   T2DM is not controlled  likely due to patient unknowingly reducing insulin dose by 50% and not currently taking Trulicity. Medication adherence appears optimal. Additional pharmacotherapy is not appropriate at this time. Discussed with patient the difference between U-100 and U-200 insulin and in the future to follow the directions written on the prescription and if she has questions or concerns she should call clinic before administering or making medication changes. Patient agreeable to re-start Trulicity with goal to titrate medication as appropriate. Will not adjust insulin at this time due to potential hypoglycemic event yesterday and having no updated blood glucose readings since April 15th when patient had issues with [facility] transmitter. Still working on getting patient inpen device to administer bolus doses of insulin. Provided patient phone number to contact company to finalize enrollment. Following instruction patient verbalized understanding of treatment plan.  Dexcom CGM placed on patient's stomach successfully. Patient connected to Dexcom with clinic so readings may now be viewed.  1. Continued basal insulin degludec [name]) 20 units once daily. 2. Continued  rapid insulin lispro (Humalog) 10-12 units TID with meals.  3. Restarted GLP-1 Trulicity 0.$RemoveBeforeDEI'75mg'ltfzeaPzoBeURrHn$  once weekly. 4. Continue to use Dexcom CGM 5. Extensively discussed pathophysiology of diabetes, dietary effects on blood sugar control, and recommended lifestyle interventions 6. Patient will adhere to dietary modifications 7. Patient exercising around 100 minutes weekly with goal to increase towards target of at least  150 minutes of moderate intensity exercise weekly 8. Counseled on s/sx of and management of hypoglycemia 9. Next A1C anticipated in 3 months.  Hypertension longstanding currently uncontrolled, however, patient's home blood pressure readings are within goal.  Blood pressure goal <130/80 mmHg. Medication adherence appears optimal.   1. Continue  current blood pressure medications 2. Continue checking blood pressure at home  Follow-up appointment via telephone in one week to review Dexcom blood glucose results. Written patient instructions provided.  This appointment required 60 minutes of patient care (this includes precharting, chart review, review of results, and face-to-face care).  Thank you for involving pharmacy to assist in providing this patient's care.  Patient seen with Marlowe Alt PharmD Candidate  The patient is currently using Continuous Glucose Monitoring. The patient is injecting insulin 4 times a day and was previously testing blood glucose at least 3 times a day. The patient is making adjustments to their diabetes regimen based on glucose readings.

## 2020-05-26 NOTE — Patient Instructions (Addendum)
Ms. Yamin it was a pleasure seeing you today.   Please do the following:  1. Continue taking Tresiba 20 units once daily and Humalog 10-12 units TID as directed today during your appointment. If you have any questions or if you believe something has occurred because of this change, call me or your doctor to let one of [place] know.  2. Pick up Trulicity 0.75mg  from the pharmacy and take once weekly for four weeks. In about four weeks I will call you to discuss increasing the dose.   3. Continue checking blood sugars at home. It's really important that you record these and bring these in to your next doctor's appointment.  4. Continue making the lifestyle changes we've discussed together during our visit. Diet and exercise play a significant role in improving your blood sugars.  5. I will call you in one week to review your blood glucose readings. 6. Please call [phone] ext [phone] regarding your inpen device.  Hypoglycemia or low blood sugar:   Low blood sugar can happen quickly and may become an emergency if not treated right away.   While this shouldn't happen often, it can be brought upon if you skip a meal or do not eat enough. Also, if your insulin or other diabetes medications are dosed too high, this can cause your blood sugar to go to low.   Warning signs of low blood sugar include: 1. Feeling shaky or dizzy 2. Feeling weak or tired  3. Excessive hunger 4. Feeling anxious or upset  5. Sweating even when you aren't exercising  What to do if I experience low blood sugar? Follow the Rule of 15 1. Check your blood sugar with your meter. If lower than 70, proceed to step 2.  2. Treat with 15 grams of fast acting carbs which is found in 3-4 glucose tablets. If none are available you can try hard candy, 1 tablespoon of sugar or honey,4 ounces of fruit juice, or 6 ounces of REGULAR soda.  3. Re-check your sugar in 15 minutes. If it is still below 70, do what you did in step 2 again. If  your blood sugar has come back up, go ahead and eat a snack or small meal made up of complex carbs (ex. Whole grains) and protein at this time to avoid recurrence of low blood sugar.

## 2020-05-27 NOTE — Assessment & Plan Note (Signed)
Hypertension longstanding currently uncontrolled, however, patient's home blood pressure readings are within goal.  Blood pressure goal <130/80 mmHg. Medication adherence appears optimal.   1. Continue current blood pressure medications 2. Continue checking blood pressure at home

## 2020-05-27 NOTE — Assessment & Plan Note (Signed)
T2DM is not controlled likely due to patient unknowingly reducing insulin dose by 50% and not currently taking Trulicity. Medication adherence appears optimal. Additional pharmacotherapy is not appropriate at this time. Discussed with patient the difference between U-100 and U-200 insulin and in the future to follow the directions written on the prescription and if she has questions or concerns she should call clinic before administering or making medication changes. Patient agreeable to re-start Trulicity with goal to titrate medication as appropriate. Will not adjust insulin at this time due to potential hypoglycemic event yesterday and having no updated blood glucose readings since April 15th when patient had issues with [facility] transmitter. Still working on getting patient inpen device to administer bolus doses of insulin. Provided patient phone number to contact company to finalize enrollment. Following instruction patient verbalized understanding of treatment plan.  Dexcom CGM placed on patient's stomach successfully. Patient connected to Dexcom with clinic so readings may now be viewed.  1. Continued basal insulin degludec Evaristo Bury) 20 units once daily. 2. Continued  rapid insulin lispro (Humalog) 10-12 units TID with meals.  3. Restarted GLP-1 Trulicity 0.75mg  once weekly. 4. Continue to use Dexcom CGM 5. Extensively discussed pathophysiology of diabetes, dietary effects on blood sugar control, and recommended lifestyle interventions 6. Patient will adhere to dietary modifications 7. Patient exercising around 100 minutes weekly with goal to increase towards target of at least 150 minutes of moderate intensity exercise weekly 8. Counseled on s/sx of and management of hypoglycemia 9. Next A1C anticipated in 3 months.

## 2020-05-29 ENCOUNTER — Telehealth: Payer: Self-pay

## 2020-05-29 DIAGNOSIS — Z794 Long term (current) use of insulin: Secondary | ICD-10-CM | POA: Diagnosis not present

## 2020-05-29 DIAGNOSIS — H25812 Combined forms of age-related cataract, left eye: Secondary | ICD-10-CM | POA: Diagnosis not present

## 2020-05-29 DIAGNOSIS — E113551 Type 2 diabetes mellitus with stable proliferative diabetic retinopathy, right eye: Secondary | ICD-10-CM | POA: Diagnosis not present

## 2020-05-29 DIAGNOSIS — E113522 Type 2 diabetes mellitus with proliferative diabetic retinopathy with traction retinal detachment involving the macula, left eye: Secondary | ICD-10-CM | POA: Diagnosis not present

## 2020-05-29 NOTE — Telephone Encounter (Signed)
Please see the below determination for rx.       Called pharmacy and provided with authorization. Called and LVM with patient to return call to office for update on medication.   Veronda Prude, RN

## 2020-05-29 NOTE — Telephone Encounter (Signed)
Received fax from pharmacy, PA needed on Humalog Kwikpen 200 unit/ mL.  Clinical questions submitted via Cover My Meds.  Waiting on response, could take up to 72 hours.  Cover My Meds info: Key: [id number]  Veronda Prude, RN

## 2020-06-04 ENCOUNTER — Ambulatory Visit: Payer: Medicaid Other | Admitting: Allergy

## 2020-06-04 ENCOUNTER — Telehealth: Payer: Self-pay | Admitting: Family Medicine

## 2020-06-04 NOTE — Telephone Encounter (Signed)
Patient is returning Dr. Elmyra Ricks call and would like her to call back to discuss her dexcom.   The best call back is [phone].

## 2020-06-05 NOTE — Telephone Encounter (Signed)
Thanks Dr. Selena Batten! I reached out to the patient on Monday to follow up and was not able to get in touch with her. I can reach out to her next week. I told her I would likely have to adjust her insulin further but I was unable to at last visit because she had not been using Dexcom for almost a month or checking her blood sugars.

## 2020-06-05 NOTE — Telephone Encounter (Addendum)
Patient calling reporting discrepancies in her DexCom and POCT. (e.g. 185 on dexcom and 287 on POCT, 269 & 375, 170 & 282).  Patient is concerned that this is due to this being a sample.  She does report having this issue in the past.  Discussed with Dr. Raymondo Band.   It is reassuring that Dexcom and point-of-care are consistent in their discrepancy.  Discussed lag of Dexcom compared to point-of-care.  Patient should continue Dexcom to help with titration.  She is not having any hypoglycemia which is also reassuring.  Perhaps, as her glucose is improved, the discrepancy will also improve.  She can also try using on a different site.  These are only troubleshooting methods.  We will continue to monitor her sugars.  She does not have to do point-of-care checks with every Dexcom check. Patient has sensors at home and does not have any needs at this time.  She recently followed with Dr. Nicholaus Bloom earlier this month, her next follow-up is in late August.  Melene Plan, M.D.  1:43 PM 06/05/2020

## 2020-06-11 DIAGNOSIS — F4325 Adjustment disorder with mixed disturbance of emotions and conduct: Secondary | ICD-10-CM | POA: Diagnosis not present

## 2020-06-12 ENCOUNTER — Ambulatory Visit: Payer: Medicaid Other | Admitting: Family Medicine

## 2020-06-12 NOTE — Progress Notes (Deleted)
   [address] [place] [address] Dept: [phone]  FOLLOW UP NOTE  Patient ID: Karina [name], female    DOB: [birth year]  Age: 35 y.o. MRN: [id number] Date of Office Visit: 06/12/2020  Assessment  Chief Complaint: No chief complaint on file.  HPI Ronnell P Wanamaker    Drug Allergies:  Allergies  Allergen Reactions  . Lisinopril Cough  . Eggs Or Egg-Derived Products Nausea And Vomiting  . Contrast Media [Iodinated Diagnostic Agents]     Physical Exam: There were no vitals taken for this visit.   Physical Exam  Diagnostics:    Assessment and Plan: No diagnosis found.  No orders of the defined types were placed in this encounter.   There are no Patient Instructions on file for this visit.  No follow-ups on file.    Thank you for the opportunity to care for this patient.  Please do not hesitate to contact me with questions.  Thermon Leyland, FNP [facility]

## 2020-06-16 IMAGING — DX CHEST - 2 VIEW
2 series · 2 of 2 positions shown · non-contrast
Comparison: None.

CLINICAL DATA: Productive cough

EXAM:
CHEST - 2 VIEW

[chest pa]
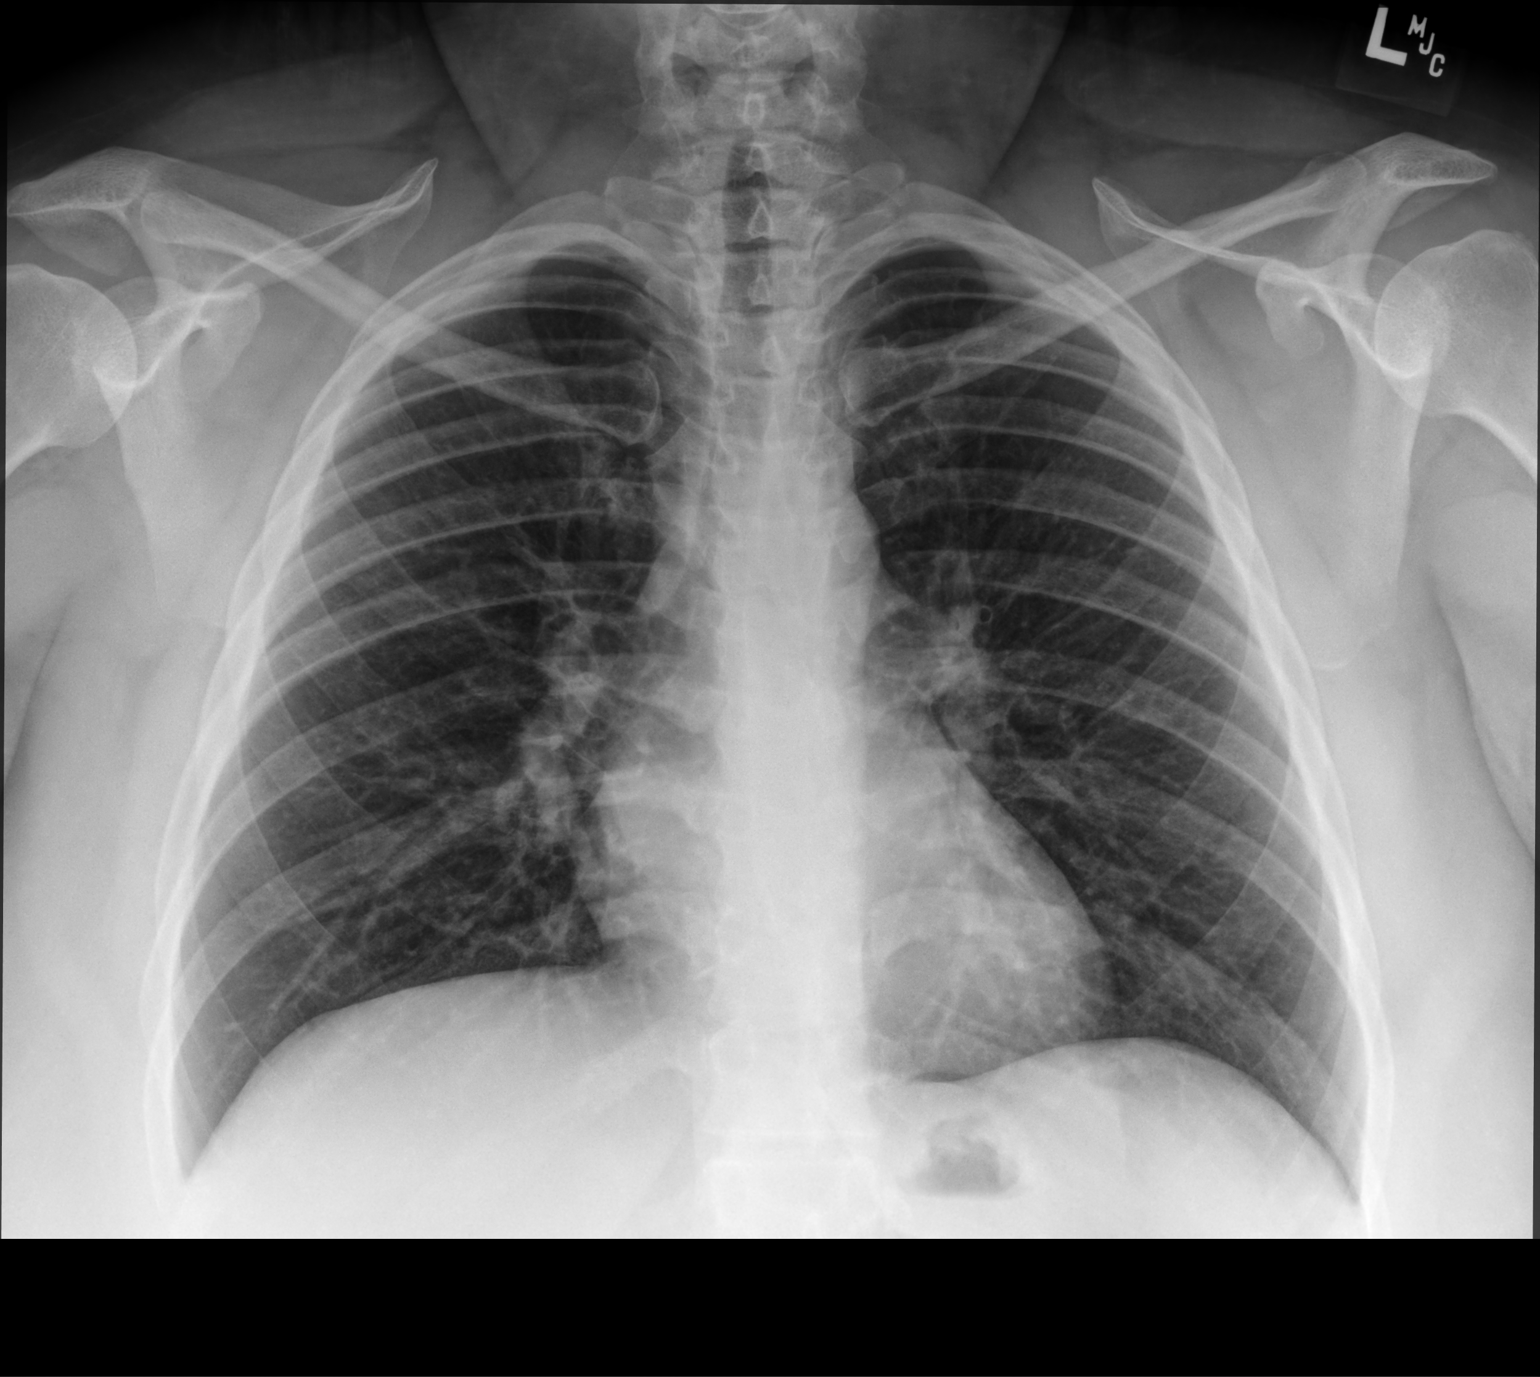

[chest lat]
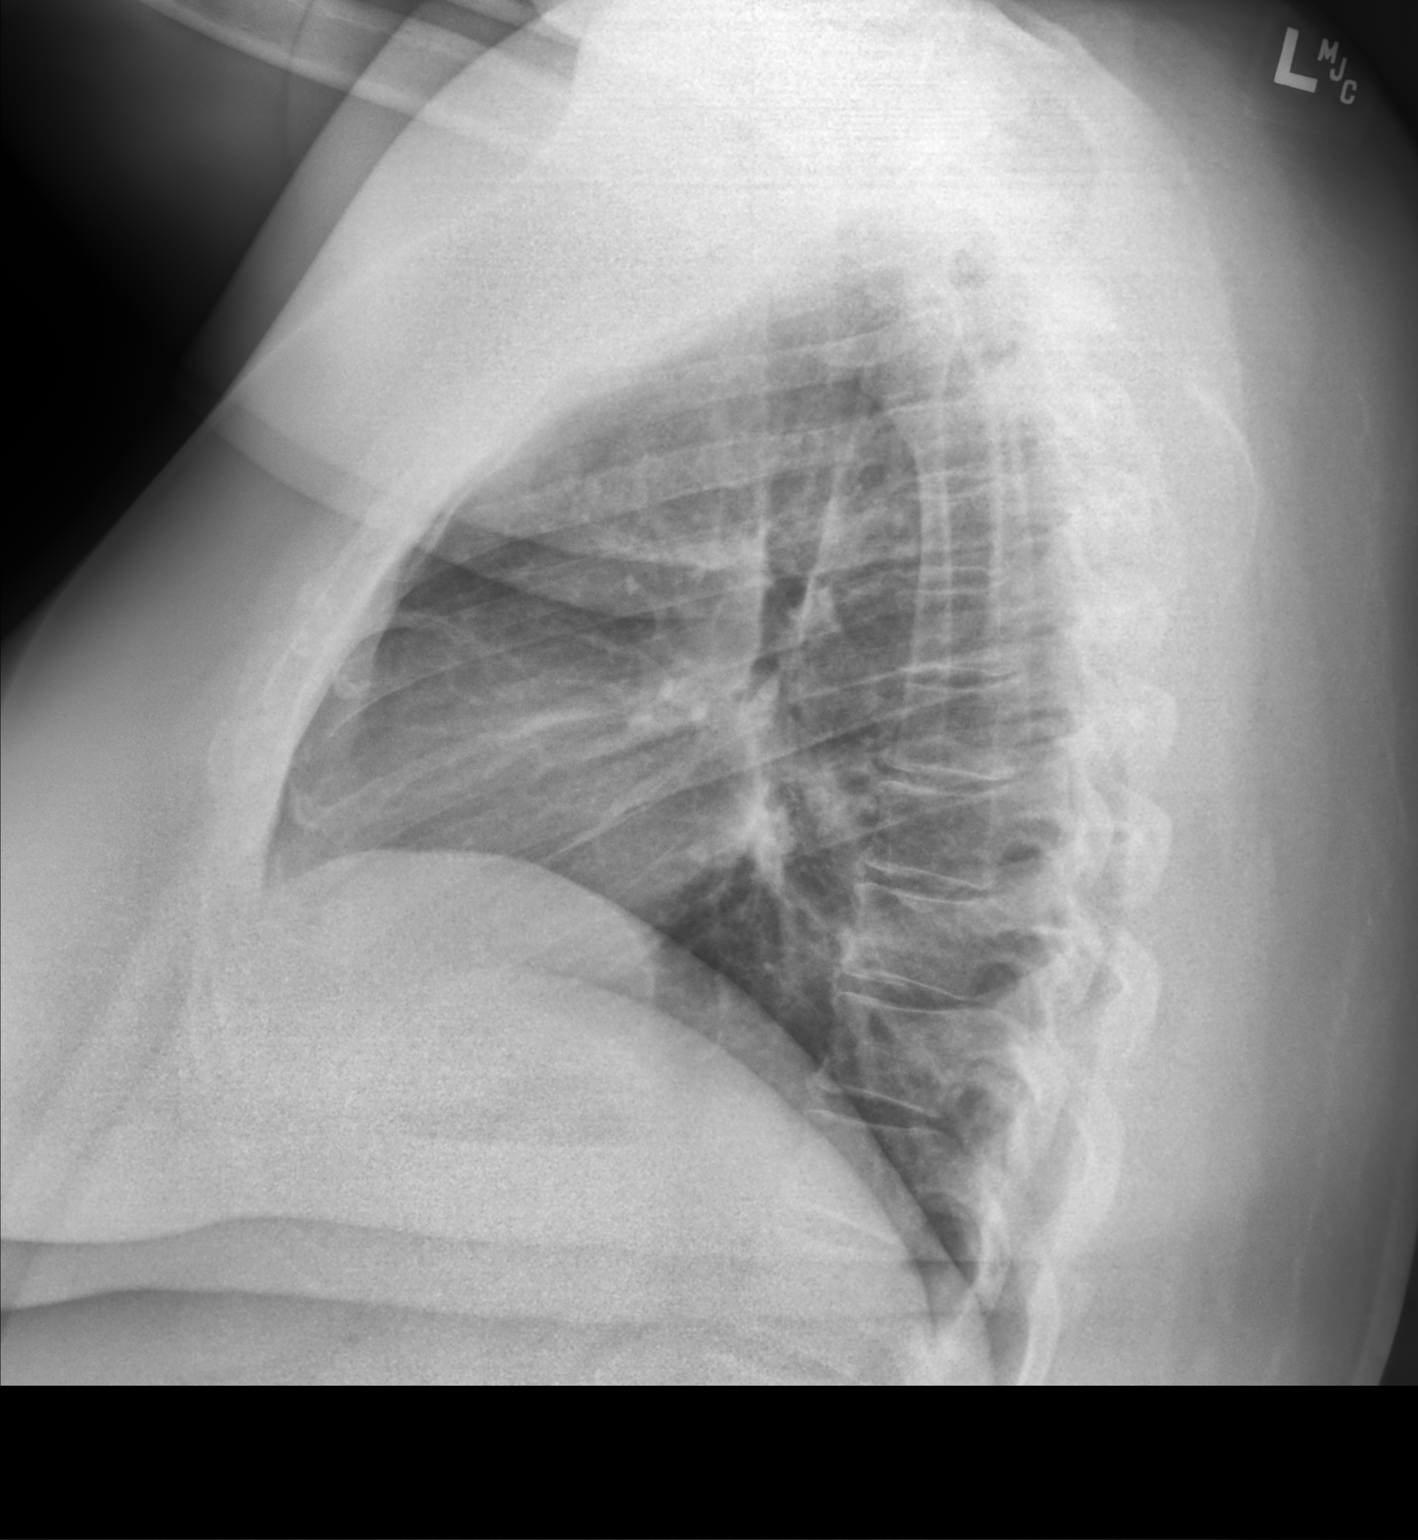

[2 of 2 positions shown; findings below may reference images not displayed]

FINDINGS: The heart size and mediastinal contours are within normal limits.
Both lungs are clear. The visualized skeletal structures are
unremarkable.
IMPRESSION: Negative chest.

## 2020-06-18 ENCOUNTER — Telehealth: Payer: Self-pay | Admitting: Internal Medicine

## 2020-06-18 DIAGNOSIS — Z794 Long term (current) use of insulin: Secondary | ICD-10-CM | POA: Diagnosis not present

## 2020-06-18 DIAGNOSIS — E113522 Type 2 diabetes mellitus with proliferative diabetic retinopathy with traction retinal detachment involving the macula, left eye: Secondary | ICD-10-CM | POA: Diagnosis not present

## 2020-06-18 DIAGNOSIS — H0288B Meibomian gland dysfunction left eye, upper and lower eyelids: Secondary | ICD-10-CM | POA: Diagnosis not present

## 2020-06-18 DIAGNOSIS — H25812 Combined forms of age-related cataract, left eye: Secondary | ICD-10-CM | POA: Diagnosis not present

## 2020-06-18 DIAGNOSIS — H0288A Meibomian gland dysfunction right eye, upper and lower eyelids: Secondary | ICD-10-CM | POA: Diagnosis not present

## 2020-06-18 DIAGNOSIS — E113551 Type 2 diabetes mellitus with stable proliferative diabetic retinopathy, right eye: Secondary | ICD-10-CM | POA: Diagnosis not present

## 2020-06-18 NOTE — Telephone Encounter (Signed)
PT CALLED IN STATING THAT SHE IS EXPERIENCING SWELLING AND WOULD LIKE TO SEE DR. ACHARYA AS SHE IS CONCERNED HOW THIS WILL AFFECT HER UPCOMING PROCEDURES.  Pt c/o swelling: STAT is pt has developed SOB within 24 hours  1. If swelling, where is the swelling located? LEGS, FEET AND HANDS   2. How much weight have you gained and in what time span? ABOUT 20 POUNDS IN A MONTH AND A HALF ALMOST TWO MONTHS   3. Have you gained 3 pounds in a day or 5 pounds in a week? YES  4. Do you have a log of your daily weights (if so, list)? 260, 261, [id number]  5. Are you currently taking a fluid pill? NO  Are you currently SOB? NOT UNLESS SHE IS MOVING AROUND   6. Have you traveled recently? Yes

## 2020-06-18 NOTE — Telephone Encounter (Signed)
Returned call to patient no answer.Unable to leave a message mailbox is full. 

## 2020-06-20 NOTE — Telephone Encounter (Signed)
Unable to reach pt or leave a message mailbox is full 

## 2020-06-23 DIAGNOSIS — F4325 Adjustment disorder with mixed disturbance of emotions and conduct: Secondary | ICD-10-CM | POA: Diagnosis not present

## 2020-06-23 IMAGING — DX CHEST - 2 VIEW
2 series · 2 of 2 positions shown · non-contrast
Comparison: PA and lateral chest 05/15/2018.

CLINICAL DATA: Left chest pain and shortness of breath for 2-2.5
weeks.

EXAM:
CHEST - 2 VIEW

[chest pa]
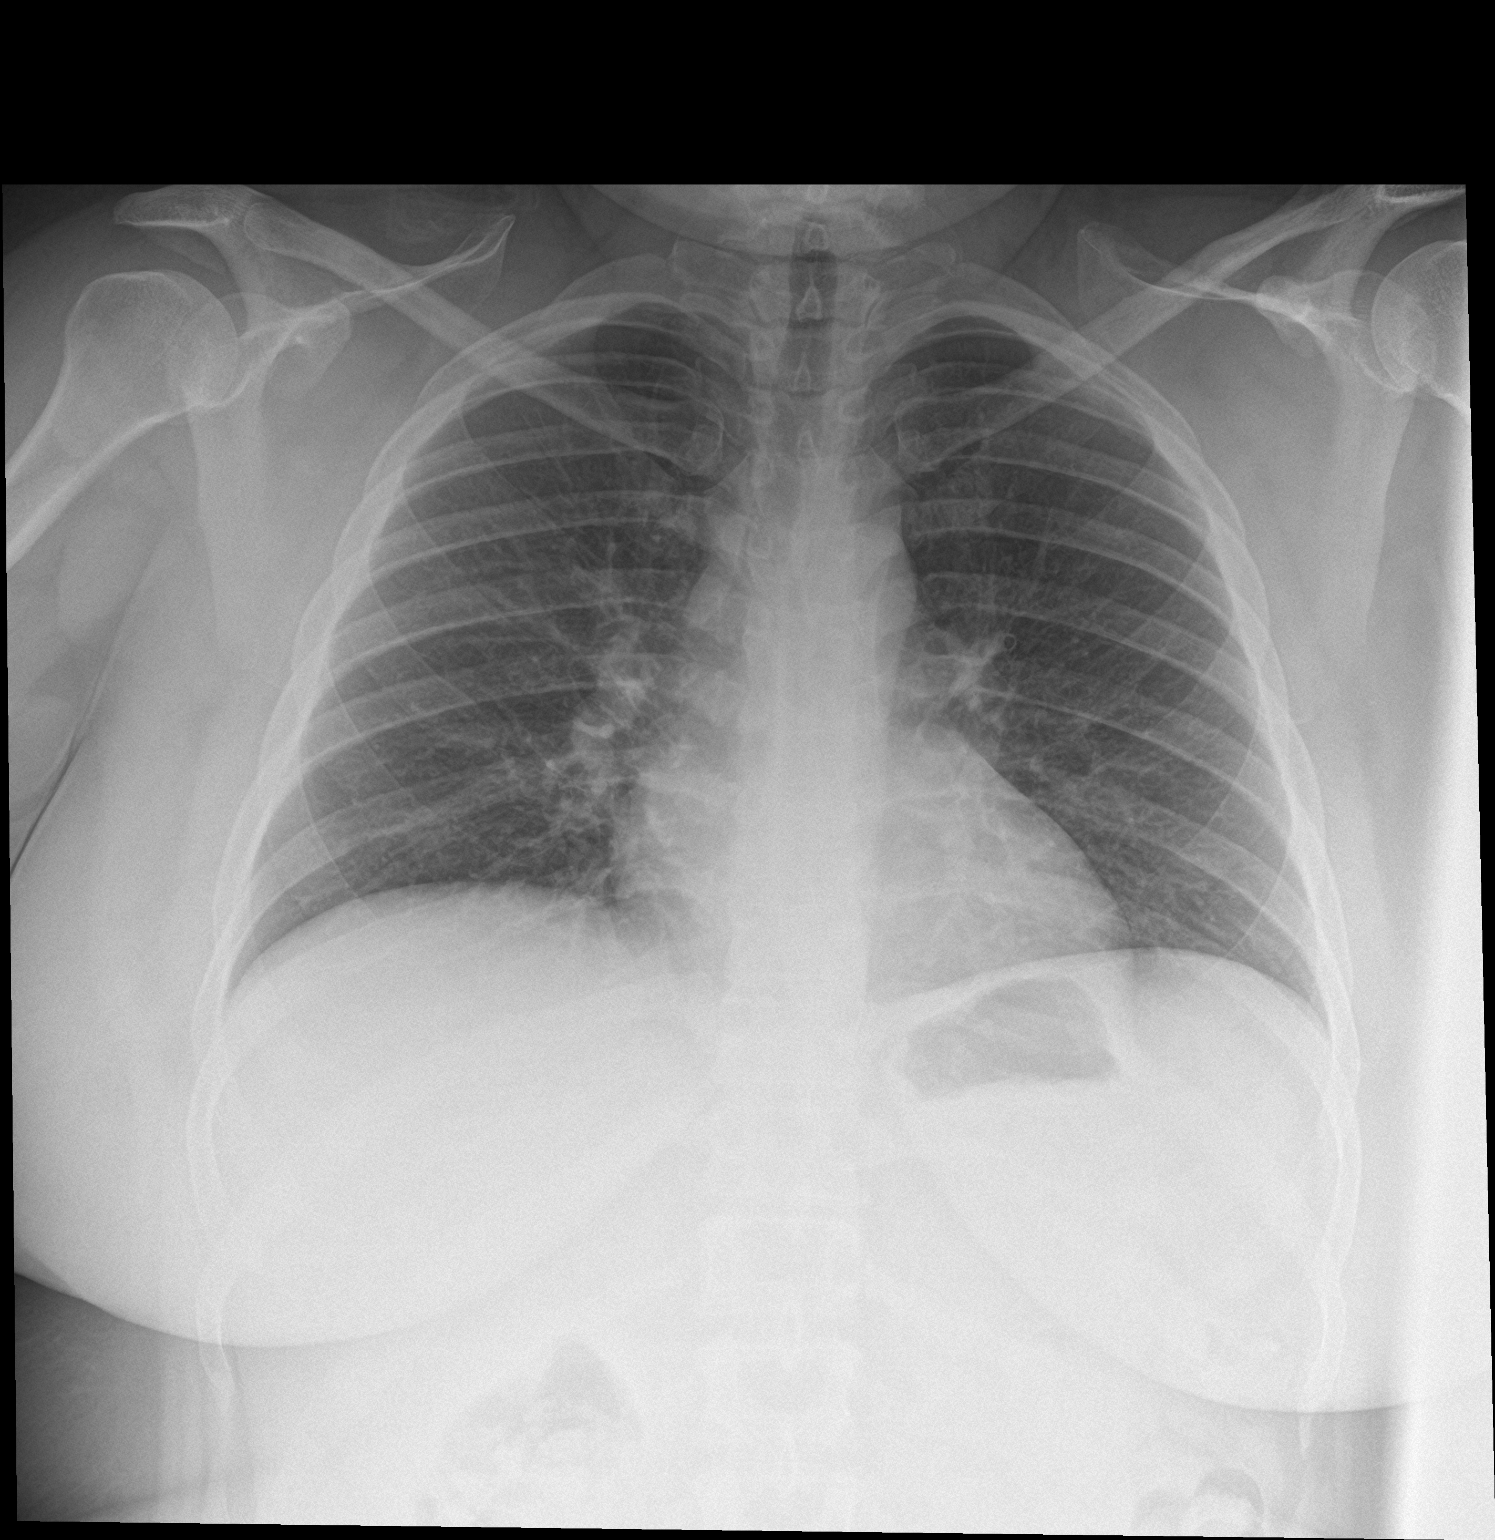

[chest lat]
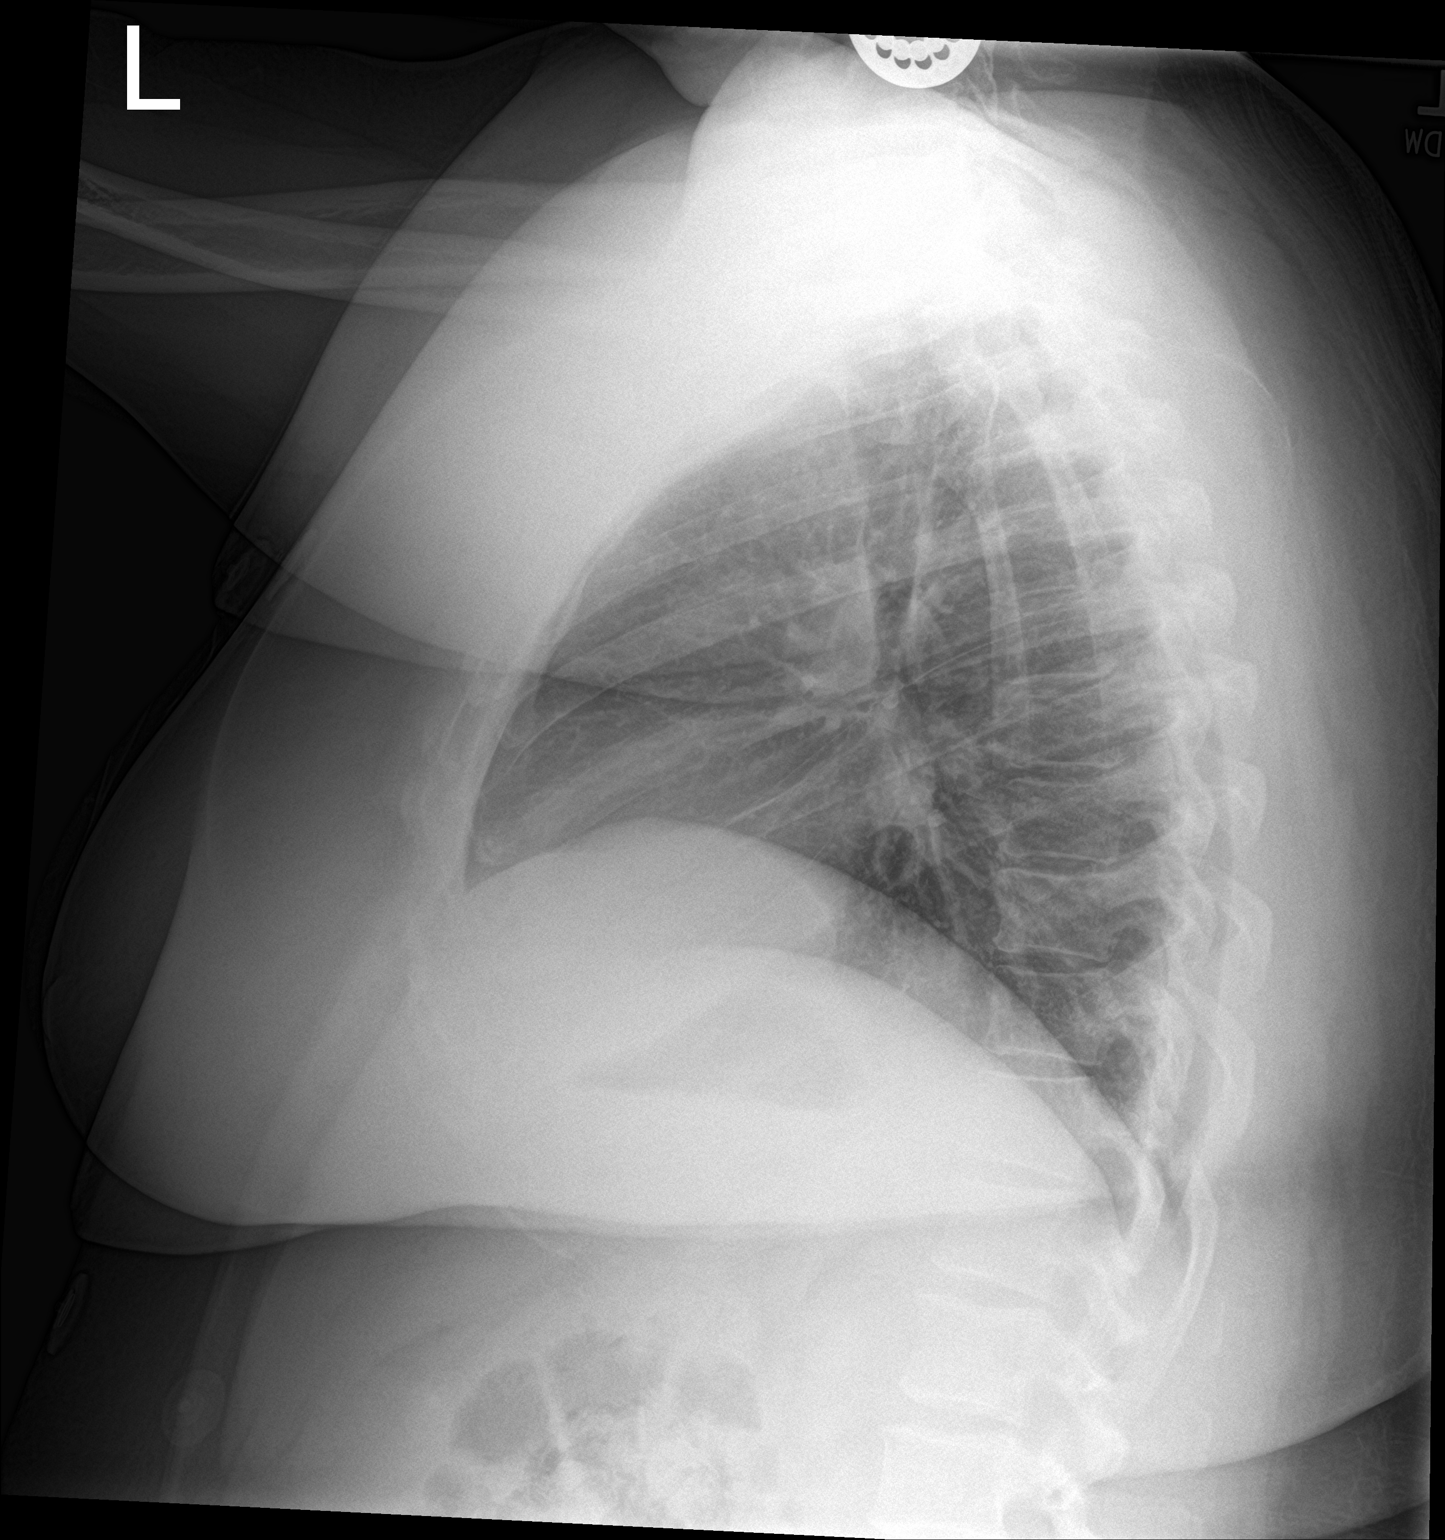

[2 of 2 positions shown; findings below may reference images not displayed]

FINDINGS: Lungs clear. Heart size normal. No pneumothorax or pleural fluid. No
acute or focal bony abnormality.
IMPRESSION: Negative chest.

## 2020-06-23 NOTE — Telephone Encounter (Signed)
Called patient left message on personal voice mail calling about a message from last week.Advised to call back.

## 2020-06-24 ENCOUNTER — Telehealth: Payer: Self-pay | Admitting: Pharmacist

## 2020-06-24 MED ORDER — TRULICITY 1.5 MG/0.5ML ~~LOC~~ SOAJ: 1.5000 mg | SUBCUTANEOUS | 0 refills | Status: DC

## 2020-06-24 NOTE — Telephone Encounter (Signed)
FMTS ATTENDING NOTE Nicolette Bang I  I agree with the clinical pharmacist's findings, assessment and care plan.

## 2020-06-24 NOTE — Telephone Encounter (Signed)
Called patient to follow-up on status of inpen. Patient stated she is still waiting on something from the company and has yet to receive the device. Provided patient phone number to follow-up. Patient is going to call me back after she speaks with them.  Patient also informed me that she took her last dose of Trulicity 0.75mg  last Wednesday. Discussed with patient plan to increase Trulicity since her avg blood glucose on dexcom was 268. Patient agreeable to plan. Patient verbalized proper hypoglycemic management. Will make follow-up appt when patient calls back.

## 2020-06-25 ENCOUNTER — Other Ambulatory Visit: Payer: Self-pay

## 2020-06-25 ENCOUNTER — Ambulatory Visit: Payer: Medicaid Other | Admitting: Internal Medicine

## 2020-06-25 ENCOUNTER — Encounter: Payer: Self-pay | Admitting: Internal Medicine

## 2020-06-25 VITALS — BP 140/60 | HR 96 | Ht 62.0 in | Wt 264.6 lb

## 2020-06-25 DIAGNOSIS — Z01818 Encounter for other preprocedural examination: Secondary | ICD-10-CM | POA: Diagnosis not present

## 2020-06-25 DIAGNOSIS — M7989 Other specified soft tissue disorders: Secondary | ICD-10-CM

## 2020-06-25 NOTE — Progress Notes (Signed)
Cardiology Office Note   Date:  06/25/2020   ID:  Noga, Fogg 1985/11/12, MRN [id number]  PCP:  Wilber Oliphant, MD  Cardiologist:  None  Electrophysiologist:  None   Evaluation Performed:  Follow-Up Visit  Chief Complaint:  Chest pain  History of Present Illness:    Karina [name] is a 35 y.o. female with DM2 with neuropathy and retinopathy, DOE after COVID 19 infection, HTN, LE edema, obesity, syncope and tachycardia. She presents for evaluation of chest pain and DOE.     Her stress test showed no ischemia or infarction. Overall her symptoms improved on Protonix.  Today, she reports her eye surgery went well. On 6/27 she is scheduled for another procedure. She wanted to follow-up with cardiology for her own concerns about having CHF. We discussed her last Echo results (07/2019).     She endorses edema in her bilateral LE's, hands, and abdomen. Her nephrologist Dr. Hollie Salk discontinued her furosemide due to continued inadequate urination and likely no continued need. She was told to increase her salt intake while drinking large amounts of water to avoid hyponatremia per her report. Occasionally she will wear compression stockings when travelling or driving for several hours at a time.  Of note, she is scheduled to have bariatric surgery later this year with Dr. Volanda Napoleon at [facility] in [facility]).  She denies any chest pain, shortness of breath, palpitations, or exertional symptoms. No headaches, lightheadedness, or syncope to report. Also has no orthopnea or PND.    Past Medical History:  Diagnosis Date   Acute pain of right shoulder 03/13/2019   Allergy    Angio-edema    Asthma    Back pain 12/12/2017   Carpal tunnel syndrome of right wrist 03/13/2019   COVID-19 01/24/2019   Depression 12/27/2012   Diabetes mellitus    Type 2, insulin resistant   Diabetic neuropathy, type II diabetes mellitus ([place]) 05/23/2013   DOE (dyspnea on exertion)  05/21/2019   Onset with covid 19 infection symptoms started 01/16/19  -  05/23/2019   Walked RA  3 laps @ approx 26f each @ moderat pace  stopped due to end of study,  no limiting sob/ sats 100% at end despite reported sob at rest    Essential hypertension, benign 12/12/2013   GERD (gastroesophageal reflux disease)    History of COVID-19 03/22/2019   Lower extremity edema 08/29/2017   Moderate nonproliferative diabetic retinopathy ([facility] 05/24/2013   Both OS and OD.  Diagnosed by Dr. SLoyal Buba MD, PhD 05/23/13. Also mild macular edema. Follows with Dr. 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   benzonatate (TESSALON) 200 MG capsule Take three times a day until no longer coughing, then take as needed (Patient taking differently: Take 200 mg by mouth 3 (three) times daily as needed. Take three times a day until no longer coughing, then take as needed)   Blood Pressure Monitoring (BLOOD PRESSURE MONITOR AUTOMAT) DEVI 1 kit by Does not apply route daily. Please call the office if you have any issues obtaining this monitor.    carvedilol (COREG) 6.25 MG tablet Take 6.25 mg by mouth 2 (two) times daily with a meal.   Cholecalciferol (VITAMIN D3) 50 MCG (2000 UT) CHEW Chew 2,000 Units by mouth daily.   Continuous Blood Gluc Sensor (DEXCOM G6 SENSOR) MISC Place one sensor every 10 days.   Continuous Blood Gluc Transmit (DEXCOM G6 TRANSMITTER) MISC Use every 90 days to transmit information from sensor.   Dulaglutide (TRULICITY) 1.5 DZ/3.2DJ SOPN Inject 1.5 mg into the skin once a week.   fluticasone-salmeterol (ADVAIR HFA) 115-21 MCG/ACT inhaler Inhale 2 puffs into the lungs 2 (two) times daily.   HUMALOG KWIKPEN 200 UNIT/ML KwikPen INJECT 20 TO 28 UNITS SUBCUTANEOUSLY THREE TIMES DAILY   injection device for insulin (INPEN 100-PINK-NOVO) DEVI Use as directed with novolog cartridges   Insulin Pen Needle (B-D ULTRAFINE III SHORT PEN) 31G X 8 MM MISC 1 Container by Does not apply route as needed.   losartan (COZAAR) 100 MG tablet Take 100 mg by mouth daily.   pantoprazole (PROTONIX) 40 MG tablet Take 1 tablet (40 mg total) by mouth daily.   TRESIBA FLEXTOUCH 200 UNIT/ML FlexTouch Pen INJECT 50 UNITS SUBCUTANEOUSLY ONCE DAILY FOR 28 DAYS     Allergies:   Lisinopril, Eggs or egg-derived products, and Contrast media [iodinated diagnostic agents]   Social History   Tobacco Use   Smoking status: Never   Smokeless tobacco: Never  Vaping Use   Vaping Use: Never used  Substance Use Topics   Alcohol use: No   Drug use: No     Family Hx: The patient's family history includes Breast cancer in her mother; Cancer in her mother; Cirrhosis in her father; Diabetes in her father and mother; Stomach cancer in her father. There is no history of Rectal cancer, Esophageal cancer, or Colon cancer.  ROS:   Please see the history of present illness.    (+) Edema, bilateral hands and abdomen (+) Bilateral LE edema All other systems reviewed and are negative.   Prior CV studies:   The following studies were reviewed  today:  Nuclear Stress Test 09/25/2019: Nuclear stress EF: 56%. Blood pressure demonstrated a normal response to exercise. There was no ST segment deviation noted during stress. The study is normal. This is a low risk study. The left ventricular ejection fraction is normal (55-65%).   Normal stress nuclear study with no ischemia or infarction.  Gated ejection fraction 56% with normal wall motion.  Echo 08/09/2019: 1. Normal GLS -22.3. Left ventricular ejection fraction, by estimation,  is 55 to 60%. The left ventricle has normal function. The left ventricle  has no regional wall motion abnormalities. Left ventricular diastolic  parameters were normal.   2. Right ventricular systolic function is normal. The right ventricular  size is normal.   3. Left atrial size was moderately dilated.   4. The mitral valve is normal in structure. Trivial mitral valve  regurgitation. No evidence of mitral stenosis.  5. The aortic valve is tricuspid. Aortic valve regurgitation is not  visualized. No aortic stenosis is present.   6. The inferior vena cava is normal in size with greater than 50%  respiratory variability, suggesting right atrial pressure of 3 mmHg.   Labs/Other Tests and Data Reviewed:    EKG:   06/25/2020: NSR, rate 96 bpm, poor R wave progression  Recent Labs: 10/11/2019: B Natriuretic Peptide 26.8 10/27/2019: ALT 26 12/25/2019: BUN 19; Creatinine, Ser 1.79; Hemoglobin 9.6; Platelets 347.0; Potassium 4.5; Pro B Natriuretic peptide (BNP) 24.0; Sodium 130   Recent Lipid Panel Lab Results  Component Value Date/Time   CHOL 208 (H) 09/14/2018 03:02 PM   TRIG 190 (H) 09/14/2018 03:02 PM   HDL 52 09/14/2018 03:02 PM   CHOLHDL 4.0 09/14/2018 03:02 PM   CHOLHDL 3.4 03/21/2015 12:30 PM   LDLCALC 123 (H) 09/14/2018 03:02 PM    Wt Readings from Last 3 Encounters:  06/25/20 264 lb 9.6 oz (120 kg)  05/26/20 259 lb (117.5 kg)  03/21/20 262 lb (118.8 kg)     Objective:    Vital  Signs:  BP 140/60   Pulse 96   Ht _0  (1.575 m)   Wt 264 lb 9.6 oz (120 kg)   SpO2 95%   BMI 48.40 kg/m    Constitutional: No acute distress Eyes: sclera non-icteric, normal conjunctiva and lids ENMT: normal dentition, moist mucous membranes Cardiovascular: regular rhythm, normal rate, no murmurs. S1 and S2 normal. Radial pulses normal bilaterally. No jugular venous distention.  Respiratory: clear to auscultation bilaterally GI : normal bowel sounds, soft and nontender. Mild distention.   MSK: extremities warm, well perfused. 1+ bilateral edema. NEURO: grossly nonfocal exam, moves all extremities. PSYCH: alert and oriented x 3, normal mood and affect.    ASSESSMENT & PLAN:    Pre-op evaluation - Plan: EKG 12-Lead  Swelling of both lower extremities  The patient is low-intermediate risk for intermediate risk procedure due to need for general anesthesia.  No further cardiovascular testing is required prior to the procedure.  If this level of risk is acceptable to the patient and surgical team, the patient should be considered optimized from a cardiovascular standpoint. This has been discussed in detail with patient. She understands risk level and is willing to proceed.  Chest pain is likely noncardiac and improving with PPI.   LE swelling may be due to weight and venous insufficiency.  We discussed very reassuring cardiac testing and no signs of either systolic or diastolic heart failure.  I have encouraged her to wear compression stockings when able to limit lower extremity edema.  She also plans to have weight loss surgery later this year, which may also help with abdominal and lower extremity edema.  Continue losartan and carvedilol for HTN at current dose.   Total time of encounter: 30 minutes total time of encounter, including 20 minutes spent in face-to-face patient care on the date of this encounter. This time includes coordination of care and counseling regarding above  mentioned problem list. Remainder of non-face-to-face time involved reviewing chart documents/testing relevant to the patient encounter and documentation in the medical record. I have independently reviewed documentation from referring provider.   Cherlynn Kaiser, MD, [facility]    Medication Adjustments/Labs and Tests Ordered: Current medicines are reviewed at length with the patient today.  Concerns regarding medicines are outlined above.   Patient Instructions  Medication Instructions:  Your Physician recommend you continue on your  current medication as directed.    *If you need a refill on your cardiac medications before your next appointment, please call your pharmacy*   Lab Work: None ordered today   Testing/Procedures: None ordered   Follow-Up: At [facility], you and your health needs are our priority.  As part of our continuing mission to provide you with exceptional heart care, we have created designated Provider Care Teams.  These Care Teams include your primary Cardiologist (physician) and Advanced Practice Providers (APPs -  Physician Assistants and Nurse Practitioners) who all work together to provide you with the care you need, when you need it.  We recommend signing up for the patient portal called "MyChart".  Sign up information is provided on this After Visit Summary.  MyChart is used to connect with patients for Virtual Visits (Telemedicine).  Patients are able to view lab/test results, encounter notes, upcoming appointments, etc.  Non-urgent messages can be sent to your provider as well.   To learn more about what you can do with MyChart, go to [email or web address].    Your next appointment:   As needed   The format for your next appointment:   In Person  Provider:   Elouise Munroe, MD   [facility],acting as a scribe for Elouise Munroe, MD.,have documented all relevant documentation on the behalf of Elouise Munroe,  MD,as directed by  Elouise Munroe, MD while in the presence of Elouise Munroe, MD.  I, Elouise Munroe, MD, have reviewed all documentation for this visit. The documentation on 06/25/20 for the exam, diagnosis, procedures, and orders are all accurate and complete.

## 2020-06-25 NOTE — Patient Instructions (Signed)
Medication Instructions:  Your Physician recommend you continue on your current medication as directed.    *If you need a refill on your cardiac medications before your next appointment, please call your pharmacy*   Lab Work: None ordered today   Testing/Procedures: None ordered   Follow-Up: At [facility], you and your health needs are our priority.  As part of our continuing mission to provide you with exceptional heart care, we have created designated Provider Care Teams.  These Care Teams include your primary Cardiologist (physician) and Advanced Practice Providers (APPs -  Physician Assistants and Nurse Practitioners) who all work together to provide you with the care you need, when you need it.  We recommend signing up for the patient portal called "MyChart".  Sign up information is provided on this After Visit Summary.  MyChart is used to connect with patients for Virtual Visits (Telemedicine).  Patients are able to view lab/test results, encounter notes, upcoming appointments, etc.  Non-urgent messages can be sent to your provider as well.   To learn more about what you can do with MyChart, go to [email or web address].    Your next appointment:   As needed   The format for your next appointment:   In Person  Provider:   Jodelle Red, MD

## 2020-06-26 DIAGNOSIS — G4733 Obstructive sleep apnea (adult) (pediatric): Secondary | ICD-10-CM | POA: Diagnosis not present

## 2020-06-26 DIAGNOSIS — E1142 Type 2 diabetes mellitus with diabetic polyneuropathy: Secondary | ICD-10-CM | POA: Diagnosis not present

## 2020-06-26 DIAGNOSIS — Z6841 Body Mass Index (BMI) 40.0 and over, adult: Secondary | ICD-10-CM | POA: Diagnosis not present

## 2020-06-26 DIAGNOSIS — E1121 Type 2 diabetes mellitus with diabetic nephropathy: Secondary | ICD-10-CM | POA: Diagnosis not present

## 2020-06-26 DIAGNOSIS — I10 Essential (primary) hypertension: Secondary | ICD-10-CM | POA: Diagnosis not present

## 2020-06-26 DIAGNOSIS — E11319 Type 2 diabetes mellitus with unspecified diabetic retinopathy without macular edema: Secondary | ICD-10-CM | POA: Diagnosis not present

## 2020-06-26 DIAGNOSIS — K219 Gastro-esophageal reflux disease without esophagitis: Secondary | ICD-10-CM | POA: Diagnosis not present

## 2020-06-26 DIAGNOSIS — E559 Vitamin D deficiency, unspecified: Secondary | ICD-10-CM | POA: Diagnosis not present

## 2020-06-26 DIAGNOSIS — E1165 Type 2 diabetes mellitus with hyperglycemia: Secondary | ICD-10-CM | POA: Diagnosis not present

## 2020-06-27 DIAGNOSIS — H25812 Combined forms of age-related cataract, left eye: Secondary | ICD-10-CM | POA: Diagnosis not present

## 2020-06-27 DIAGNOSIS — E113522 Type 2 diabetes mellitus with proliferative diabetic retinopathy with traction retinal detachment involving the macula, left eye: Secondary | ICD-10-CM | POA: Diagnosis not present

## 2020-06-27 DIAGNOSIS — Z794 Long term (current) use of insulin: Secondary | ICD-10-CM | POA: Diagnosis not present

## 2020-07-01 NOTE — Telephone Encounter (Signed)
Patient was seen in office by Dr.Acharya on 06/15.  Will remove from triage.

## 2020-07-07 DIAGNOSIS — E113522 Type 2 diabetes mellitus with proliferative diabetic retinopathy with traction retinal detachment involving the macula, left eye: Secondary | ICD-10-CM | POA: Diagnosis not present

## 2020-07-09 DIAGNOSIS — G4733 Obstructive sleep apnea (adult) (pediatric): Secondary | ICD-10-CM | POA: Diagnosis not present

## 2020-07-11 DIAGNOSIS — G4733 Obstructive sleep apnea (adult) (pediatric): Secondary | ICD-10-CM | POA: Diagnosis not present

## 2020-07-27 ENCOUNTER — Other Ambulatory Visit: Payer: Self-pay | Admitting: Family Medicine

## 2020-07-27 DIAGNOSIS — E1165 Type 2 diabetes mellitus with hyperglycemia: Secondary | ICD-10-CM

## 2020-07-27 DIAGNOSIS — Z794 Long term (current) use of insulin: Secondary | ICD-10-CM

## 2020-07-27 DIAGNOSIS — IMO0002 Reserved for concepts with insufficient information to code with codable children: Secondary | ICD-10-CM

## 2020-07-27 DIAGNOSIS — E113313 Type 2 diabetes mellitus with moderate nonproliferative diabetic retinopathy with macular edema, bilateral: Secondary | ICD-10-CM

## 2020-07-29 DIAGNOSIS — Z6841 Body Mass Index (BMI) 40.0 and over, adult: Secondary | ICD-10-CM | POA: Diagnosis not present

## 2020-07-29 DIAGNOSIS — E559 Vitamin D deficiency, unspecified: Secondary | ICD-10-CM | POA: Diagnosis not present

## 2020-07-29 DIAGNOSIS — I10 Essential (primary) hypertension: Secondary | ICD-10-CM | POA: Diagnosis not present

## 2020-07-29 DIAGNOSIS — K219 Gastro-esophageal reflux disease without esophagitis: Secondary | ICD-10-CM | POA: Diagnosis not present

## 2020-07-29 DIAGNOSIS — G4733 Obstructive sleep apnea (adult) (pediatric): Secondary | ICD-10-CM | POA: Diagnosis not present

## 2020-07-29 DIAGNOSIS — E1165 Type 2 diabetes mellitus with hyperglycemia: Secondary | ICD-10-CM | POA: Diagnosis not present

## 2020-07-29 DIAGNOSIS — E1121 Type 2 diabetes mellitus with diabetic nephropathy: Secondary | ICD-10-CM | POA: Diagnosis not present

## 2020-07-29 DIAGNOSIS — E1142 Type 2 diabetes mellitus with diabetic polyneuropathy: Secondary | ICD-10-CM | POA: Diagnosis not present

## 2020-07-29 DIAGNOSIS — E11319 Type 2 diabetes mellitus with unspecified diabetic retinopathy without macular edema: Secondary | ICD-10-CM | POA: Diagnosis not present

## 2020-08-05 DIAGNOSIS — Z6841 Body Mass Index (BMI) 40.0 and over, adult: Secondary | ICD-10-CM | POA: Diagnosis not present

## 2020-08-05 DIAGNOSIS — E1122 Type 2 diabetes mellitus with diabetic chronic kidney disease: Secondary | ICD-10-CM | POA: Diagnosis not present

## 2020-08-05 DIAGNOSIS — E1140 Type 2 diabetes mellitus with diabetic neuropathy, unspecified: Secondary | ICD-10-CM | POA: Diagnosis not present

## 2020-08-05 DIAGNOSIS — E1165 Type 2 diabetes mellitus with hyperglycemia: Secondary | ICD-10-CM | POA: Diagnosis not present

## 2020-08-05 DIAGNOSIS — E11319 Type 2 diabetes mellitus with unspecified diabetic retinopathy without macular edema: Secondary | ICD-10-CM | POA: Diagnosis not present

## 2020-08-05 DIAGNOSIS — E1169 Type 2 diabetes mellitus with other specified complication: Secondary | ICD-10-CM | POA: Diagnosis not present

## 2020-08-05 DIAGNOSIS — E785 Hyperlipidemia, unspecified: Secondary | ICD-10-CM | POA: Diagnosis not present

## 2020-08-05 DIAGNOSIS — N183 Chronic kidney disease, stage 3 unspecified: Secondary | ICD-10-CM | POA: Diagnosis not present

## 2020-08-05 DIAGNOSIS — Z794 Long term (current) use of insulin: Secondary | ICD-10-CM | POA: Diagnosis not present

## 2020-08-11 ENCOUNTER — Other Ambulatory Visit: Payer: Self-pay

## 2020-08-11 ENCOUNTER — Ambulatory Visit
Admission: RE | Admit: 2020-08-11 | Discharge: 2020-08-11 | Disposition: A | Discharge status: Home / Self Care | Payer: Medicaid Other | Source: Ambulatory Visit | Attending: Emergency Medicine | Admitting: Emergency Medicine

## 2020-08-11 VITALS — BP 142/88 | HR 97 | Temp 99.2°F | Resp 17

## 2020-08-11 DIAGNOSIS — G4733 Obstructive sleep apnea (adult) (pediatric): Secondary | ICD-10-CM | POA: Diagnosis not present

## 2020-08-11 DIAGNOSIS — Z20822 Contact with and (suspected) exposure to covid-19: Secondary | ICD-10-CM

## 2020-08-11 DIAGNOSIS — Z1152 Encounter for screening for COVID-19: Secondary | ICD-10-CM

## 2020-08-11 MED ORDER — MOLNUPIRAVIR 200 MG PO CAPS: 800.0000 mg | ORAL_CAPSULE | Freq: Two times a day (BID) | ORAL | 0 refills | Status: AC

## 2020-08-11 MED ORDER — BENZONATATE 100 MG PO CAPS: 200.0000 mg | ORAL_CAPSULE | Freq: Three times a day (TID) | ORAL | 0 refills | Status: DC

## 2020-08-11 NOTE — ED Provider Notes (Signed)
[facility]    CSN: [id number] Arrival date & time: 08/11/20  1233      History   Chief Complaint Chief Complaint  Patient presents with   Appointment    1230   Headache   Generalized Body Aches   Nasal Congestion   Fever   Chills    HPI Karina [name] is a 35 y.o. female presenting today for evaluation of sore throat.  Reports associated sore throat chills and body aches.  Reports 3 days ago began to develop slight throat irritation and over the past 2 to 3 days has developed worsening body aches, fatigue, sore throat with slight cough.  Reports at home COVID test positive.  Has CKD stage III, last GFR on file is 34 from 07/29/2020.  HPI  Past Medical History:  Diagnosis Date   Acute pain of right shoulder 03/13/2019   Allergy    Angio-edema    Asthma    Back pain 12/12/2017   Carpal tunnel syndrome of right wrist 03/13/2019   COVID-19 01/24/2019   Depression 12/27/2012   Diabetes mellitus    Type 2, insulin resistant   Diabetic neuropathy, type II diabetes mellitus ([place]) 05/23/2013   DOE (dyspnea on exertion) 05/21/2019   Onset with covid 19 infection symptoms started 01/16/19  -  05/23/2019   Walked RA  3 laps @ approx 230ft each @ moderat pace  stopped due to end of study,  no limiting sob/ sats 100% at end despite reported sob at rest    Essential hypertension, benign 12/12/2013   GERD (gastroesophageal reflux disease)    History of COVID-19 03/22/2019   Lower extremity edema 08/29/2017   Moderate nonproliferative diabetic retinopathy ([place]) 05/24/2013   Both OS and OD.  Diagnosed by Dr. Loyal Buba, MD, PhD 05/23/13. Also mild macular edema. Follows with Dr. 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, with long-term current use of insulin ([place]) 12/27/2012        Upper airway cough syndrome 03/22/2019    Patient Active Problem List   Diagnosis Date Noted   Other allergic rhinitis 03/18/2020   Reactive airway disease 03/18/2020   Adverse reaction to food, subsequent encounter 03/18/2020   Heartburn 03/18/2020   Vitamin D deficiency 09/04/2019   Stable proliferative diabetic retinopathy of both eyes associated with type 2 diabetes mellitus ([place]) 08/17/2019   Current mild episode of major depressive disorder ([place]) 08/08/2019   Sleep-related headache 07/05/2019   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, drug or chemical induced, with severe nonproliferative retinopathy ([place]) 07/05/2019   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, bilateral ([place]) 07/05/2019   Cephalalgia 07/05/2019   Proliferative diabetic retinopathy of right eye ([place]) 06/20/2019   Proliferative diabetic retinopathy of left eye ([place]) 06/20/2019   Vitreous hemorrhage of left eye ([place]) 06/20/2019   DOE (dyspnea on exertion) 05/21/2019   Tachycardia 05/21/2019   History of COVID-19 03/22/2019   Upper airway cough syndrome 03/22/2019   Shortness of breath 03/22/2019   Carpal tunnel syndrome of right wrist 03/13/2019   Gastroesophageal reflux disease 03/23/2018   Lower extremity edema 08/29/2017   Hypertension associated with  stage 2 chronic kidney disease due to type 2 diabetes mellitus ([place]) 12/12/2013   Morbid obesity ([place]) 05/25/2013   Type 2 diabetes mellitus with both eyes affected by moderate nonproliferative retinopathy and macular edema, with long-term current use of insulin ([place] 12/27/2012   Depression 12/27/2012    Past Surgical History:  Procedure Laterality Date   CERVICAL BIOPSY  2004   CESAREAN SECTION N/A 06/02/2012   Procedure: CESAREAN SECTION;  Surgeon: Mora Bellman, MD;  Location: [facility];  Service:  Obstetrics;  Laterality: N/A;   RETINAL DETACHMENT REPAIR W/ SCLERAL BUCKLE LE Left 10/08/2019    OB History     Gravida  1   Para  1   Term  1   Preterm      AB  0   Living  1      SAB  0   IAB      Ectopic      Multiple      Live Births  1            Home Medications    Prior to Admission medications   Medication Sig Start Date End Date Taking? Authorizing Provider  benzonatate (TESSALON) 100 MG capsule Take 2 capsules (200 mg total) by mouth every 8 (eight) hours. 08/11/20  Yes Shamaya Kauer C, PA-C  Molnupiravir 200 MG CAPS Take 4 capsules (800 mg total) by mouth 2 (two) times daily for 5 days. 08/11/20 08/16/20 Yes Jaidee Stipe C, PA-C  albuterol (VENTOLIN HFA) 108 (90 Base) MCG/ACT inhaler Inhale 1-2 puffs into the lungs every 4 (four) hours as needed for shortness of breath.    [provider]  Blood Pressure Monitoring (BLOOD PRESSURE MONITOR AUTOMAT) DEVI 1 kit by Does not apply route daily. Please call the office if you have any issues obtaining this monitor. 08/08/19   Wilber Oliphant, MD  carvedilol (COREG) 6.25 MG tablet Take 6.25 mg by mouth 2 (two) times daily with a meal.    [provider]  Cholecalciferol (VITAMIN D3) 50 MCG (2000 UT) CHEW Chew 2,000 Units by mouth daily. 02/06/20   Meccariello, Bernita Raisin, DO  Continuous Blood Gluc Sensor (DEXCOM G6 SENSOR) MISC Place one sensor every 10 days. 02/26/20   Meccariello, Bernita Raisin, DO  Continuous Blood Gluc Transmit (DEXCOM G6 TRANSMITTER) MISC Use every 90 days to transmit information from sensor. 02/26/20   Meccariello, Bernita Raisin, DO  Dulaglutide (TRULICITY) 1.5 FA/2.1HY SOPN Inject 1.5 mg into the skin once a week. 06/24/20   Hughes Better, RPH-CPP  fluticasone-salmeterol (ADVAIR HFA) [phone] MCG/ACT inhaler Inhale 2 puffs into the lungs 2 (two) times daily. 01/25/20   Martyn Ehrich, NP  HUMALOG KWIKPEN 200 UNIT/ML KwikPen INJECT 20 TO [place] TIMES DAILY 07/28/20    Ezequiel Essex, MD  injection device for insulin (INPEN 100-PINK-NOVO) DEVI Use as directed with novolog cartridges 04/10/20   Andrena Mews T, MD  Insulin Pen Needle (B-D ULTRAFINE III SHORT PEN) 31G X 8 MM MISC 1 Container by Does not apply route as needed. 08/08/19   Wilber Oliphant, MD  losartan (COZAAR) 100 MG tablet Take 100 mg by mouth daily.    [provider]  pantoprazole (PROTONIX) 40 MG tablet Take 1 tablet (40 mg total) by mouth daily. 11/16/19   Irene Shipper, MD  TRESIBA FLEXTOUCH 200 UNIT/ML FlexTouch Pen INJECT 50 UNITS SUBCUTANEOUSLY ONCE DAILY FOR 28 DAYS 07/28/20   Ezequiel Essex, MD  gabapentin (NEURONTIN) 100 MG  capsule Take 1 capsule (100 mg total) by mouth at bedtime. 08/25/18 01/31/19  Kathrene Alu, MD    Family History Family History  Problem Relation Age of Onset   Diabetes Father    Stomach cancer Father    Cirrhosis Father    Cancer Mother    Diabetes Mother    Breast cancer Mother    Rectal cancer Neg Hx    Esophageal cancer Neg Hx    Colon cancer Neg Hx     Social History Social History   Tobacco Use   Smoking status: Never   Smokeless tobacco: Never  Vaping Use   Vaping Use: Never used  Substance Use Topics   Alcohol use: No   Drug use: No     Allergies   Lisinopril, Eggs or egg-derived products, and Contrast media [iodinated diagnostic agents]   Review of Systems Review of Systems  Constitutional:  Positive for chills and fatigue. Negative for activity change, appetite change and fever.  HENT:  Positive for congestion, rhinorrhea and sore throat. Negative for ear pain, sinus pressure and trouble swallowing.   Eyes:  Negative for discharge and redness.  Respiratory:  Positive for cough. Negative for chest tightness and shortness of breath.   Cardiovascular:  Negative for chest pain.  Gastrointestinal:  Negative for abdominal pain, diarrhea, nausea and vomiting.  Musculoskeletal:  Negative for myalgias.  Skin:  Negative for  rash.  Neurological:  Negative for dizziness, light-headedness and headaches.    Physical Exam Triage Vital Signs ED Triage Vitals  Enc Vitals Group     BP      Pulse      Resp      Temp      Temp src      SpO2      Weight      Height      Head Circumference      Peak Flow      Pain Score      Pain Loc      Pain Edu?      Excl. in [place]?    No data found.  Updated Vital Signs BP (!) 142/88 (BP Location: Left Arm)   Pulse 97   Temp 99.2 F (37.3 C) (Oral)   Resp 17   LMP 07/30/2020 (Exact Date)   SpO2 97%   Visual Acuity Right Eye Distance:   Left Eye Distance:   Bilateral Distance:    Right Eye Near:   Left Eye Near:    Bilateral Near:     Physical Exam Vitals and nursing note reviewed.  Constitutional:      Appearance: She is well-developed.     Comments: No acute distress  HENT:     Head: Normocephalic and atraumatic.     Ears:     Comments: Bilateral ears without tenderness to palpation of external auricle, tragus and mastoid, EAC's without erythema or swelling, TM's with good bony landmarks and cone of light. Non erythematous.      Nose: Nose normal.     Mouth/Throat:     Comments: Oral mucosa pink and moist, no tonsillar enlargement or exudate. Posterior pharynx patent and nonerythematous, no uvula deviation or swelling. Normal phonation.  Eyes:     Conjunctiva/sclera: Conjunctivae normal.  Cardiovascular:     Rate and Rhythm: Normal rate and regular rhythm.  Pulmonary:     Effort: Pulmonary effort is normal. No respiratory distress.     Comments: Breathing comfortably at rest, CTABL, no  wheezing, rales or other adventitious sounds auscultated  Abdominal:     General: There is no distension.  Musculoskeletal:        General: Normal range of motion.     Cervical back: Neck supple.  Skin:    General: Skin is warm and dry.  Neurological:     Mental Status: She is alert and oriented to person, place, and time.     UC Treatments / Results   Labs (all labs ordered are listed, but only abnormal results are displayed) Labs Reviewed  NOVEL CORONAVIRUS, NAA    EKG   Radiology No results found.  Procedures Procedures (including critical care time)  Medications Ordered in UC Medications - No data to display  Initial Impression / Assessment and Plan / UC Course  I have reviewed the triage vital signs and the nursing notes.  Pertinent labs & imaging results that were available during my care of the patient were reviewed by me and considered in my medical decision making (see chart for details).     Suspected COVID infection-reported at home test positive, PCR pending to confirm, initiating on molnupravir reports long-haul COVID in the past, educated on importance of backup protection x3 months after use, continue symptomatic and supportive care rest and fluids and close monitoring.  Discussed strict return precautions. Patient verbalized understanding and is agreeable with plan. - Final Clinical Impressions(s) / UC Diagnoses   Final diagnoses:  Suspected COVID-19 virus infection     Discharge Instructions      COVID test pending Begin molnupravir Use backup protection x3 months after use Rest and fluids Tylenol for headache, body aches Tessalon for cough Follow-up if any symptoms not improving or worsening     ED Prescriptions     Medication Sig Dispense Auth. Provider   Molnupiravir 200 MG CAPS Take 4 capsules (800 mg total) by mouth 2 (two) times daily for 5 days. 40 capsule Zania Kalisz C, PA-C   benzonatate (TESSALON) 100 MG capsule Take 2 capsules (200 mg total) by mouth every 8 (eight) hours. 30 capsule Orman Matsumura, [place] C, PA-C      PDMP not reviewed this encounter.   Leandre Wien, Elizabeth C, PA-C 08/11/20 1350

## 2020-08-11 NOTE — ED Triage Notes (Signed)
Pt presents with c/o headache, generalized body aches, sore throat, runny nose, fever and chills X 2 days.   States she took Tylenol and children's Mucinex.  States she felt some relief in her throat after taking Mucinex. States 1000 mg of Tylenol did not help with the body aches.

## 2020-08-11 NOTE — Discharge Instructions (Addendum)
COVID test pending Begin molnupravir Use backup protection x3 months after use Rest and fluids Tylenol for headache, body aches Tessalon for cough Follow-up if any symptoms not improving or worsening

## 2020-08-12 ENCOUNTER — Encounter: Payer: Self-pay | Admitting: Family Medicine

## 2020-08-12 LAB — NOVEL CORONAVIRUS, NAA: SARS-CoV-2, NAA: DETECTED — AB

## 2020-08-12 LAB — SARS-COV-2, NAA 2 DAY TAT

## 2020-08-20 DIAGNOSIS — I129 Hypertensive chronic kidney disease with stage 1 through stage 4 chronic kidney disease, or unspecified chronic kidney disease: Secondary | ICD-10-CM | POA: Diagnosis not present

## 2020-08-20 DIAGNOSIS — E1122 Type 2 diabetes mellitus with diabetic chronic kidney disease: Secondary | ICD-10-CM | POA: Diagnosis not present

## 2020-08-20 DIAGNOSIS — N1831 Chronic kidney disease, stage 3a: Secondary | ICD-10-CM | POA: Diagnosis not present

## 2020-08-20 DIAGNOSIS — E669 Obesity, unspecified: Secondary | ICD-10-CM | POA: Diagnosis not present

## 2020-09-05 DIAGNOSIS — K219 Gastro-esophageal reflux disease without esophagitis: Secondary | ICD-10-CM | POA: Diagnosis not present

## 2020-09-05 DIAGNOSIS — E785 Hyperlipidemia, unspecified: Secondary | ICD-10-CM | POA: Diagnosis not present

## 2020-09-05 DIAGNOSIS — E11319 Type 2 diabetes mellitus with unspecified diabetic retinopathy without macular edema: Secondary | ICD-10-CM | POA: Diagnosis not present

## 2020-09-05 DIAGNOSIS — E559 Vitamin D deficiency, unspecified: Secondary | ICD-10-CM | POA: Diagnosis not present

## 2020-09-05 DIAGNOSIS — E1142 Type 2 diabetes mellitus with diabetic polyneuropathy: Secondary | ICD-10-CM | POA: Diagnosis not present

## 2020-09-05 DIAGNOSIS — I10 Essential (primary) hypertension: Secondary | ICD-10-CM | POA: Diagnosis not present

## 2020-09-05 DIAGNOSIS — E1121 Type 2 diabetes mellitus with diabetic nephropathy: Secondary | ICD-10-CM | POA: Diagnosis not present

## 2020-09-05 DIAGNOSIS — E1169 Type 2 diabetes mellitus with other specified complication: Secondary | ICD-10-CM | POA: Diagnosis not present

## 2020-09-05 DIAGNOSIS — E1165 Type 2 diabetes mellitus with hyperglycemia: Secondary | ICD-10-CM | POA: Diagnosis not present

## 2020-09-10 ENCOUNTER — Ambulatory Visit: Payer: Medicaid Other | Admitting: Podiatry

## 2020-09-10 DIAGNOSIS — E113522 Type 2 diabetes mellitus with proliferative diabetic retinopathy with traction retinal detachment involving the macula, left eye: Secondary | ICD-10-CM | POA: Diagnosis not present

## 2020-09-10 DIAGNOSIS — Z48810 Encounter for surgical aftercare following surgery on the sense organs: Secondary | ICD-10-CM | POA: Diagnosis not present

## 2020-09-10 DIAGNOSIS — Z794 Long term (current) use of insulin: Secondary | ICD-10-CM | POA: Diagnosis not present

## 2020-09-10 DIAGNOSIS — Z961 Presence of intraocular lens: Secondary | ICD-10-CM | POA: Diagnosis not present

## 2020-09-10 DIAGNOSIS — H26492 Other secondary cataract, left eye: Secondary | ICD-10-CM | POA: Diagnosis not present

## 2020-09-11 ENCOUNTER — Ambulatory Visit: Payer: Medicaid Other | Admitting: Neurology

## 2020-09-11 VITALS — BP 168/96 | HR 95 | Ht 62.0 in | Wt 270.0 lb

## 2020-09-11 DIAGNOSIS — R519 Headache, unspecified: Secondary | ICD-10-CM

## 2020-09-11 DIAGNOSIS — G9331 Postviral fatigue syndrome: Secondary | ICD-10-CM | POA: Insufficient documentation

## 2020-09-11 DIAGNOSIS — G4710 Hypersomnia, unspecified: Secondary | ICD-10-CM

## 2020-09-11 DIAGNOSIS — G4733 Obstructive sleep apnea (adult) (pediatric): Secondary | ICD-10-CM | POA: Diagnosis not present

## 2020-09-11 DIAGNOSIS — G933 Postviral fatigue syndrome: Secondary | ICD-10-CM | POA: Diagnosis not present

## 2020-09-11 DIAGNOSIS — E1165 Type 2 diabetes mellitus with hyperglycemia: Secondary | ICD-10-CM | POA: Diagnosis not present

## 2020-09-11 DIAGNOSIS — Z9989 Dependence on other enabling machines and devices: Secondary | ICD-10-CM | POA: Diagnosis not present

## 2020-09-11 DIAGNOSIS — E113313 Type 2 diabetes mellitus with moderate nonproliferative diabetic retinopathy with macular edema, bilateral: Secondary | ICD-10-CM

## 2020-09-11 DIAGNOSIS — Z794 Long term (current) use of insulin: Secondary | ICD-10-CM | POA: Diagnosis not present

## 2020-09-11 NOTE — Progress Notes (Signed)
CM sent to [facility] 

## 2020-09-11 NOTE — Patient Instructions (Signed)
Hypersomnia Hypersomnia is a condition in which a person feels very tired during the day even though he or she gets plenty of sleep at night. A person with this condition may take naps during the day and may find it very difficult to wake up from sleep. Hypersomnia may affect a person's ability to think, concentrate,drive, or remember things. What are the causes? The cause of this condition may not be known. Possible causes include: Certain medicines. Sleep disorders, such as narcolepsy and sleep apnea. Injury to the head, brain, or spinal cord. Drug or alcohol use. Gastroesophageal reflux disease (GERD). Tumors. Certain medical conditions, such as depression, diabetes, or an underactive thyroid gland (hypothyroidism). What are the signs or symptoms? The main symptoms of hypersomnia include: Feeling very tired throughout the day, regardless of how much sleep you got the night before. Having trouble waking up. Others may find it difficult to wake you up when you are sleeping. Sleeping for longer and longer periods at a time. Taking naps throughout the day. Other symptoms may include: Feeling restless, anxious, or annoyed. Lacking energy. Having trouble with: Remembering. Speaking. Thinking. Loss of appetite. Seeing, hearing, tasting, smelling, or feeling things that are not real (hallucinations). How is this diagnosed? This condition may be diagnosed based on: Your symptoms and medical history. Your sleeping habits. Your health care provider may ask you to write down your sleeping habits in a daily sleep log, along with any symptoms you have. A series of tests that are done while you sleep (sleep study or polysomnogram). A test that measures how quickly you can fall asleep during the day (daytime nap study or multiple sleep latency test). How is this treated? Treatment can help you manage your condition. Treatment may include: Following a regular sleep routine. Lifestyle changes,  such as changing your eating habits, getting regular exercise, and avoiding alcohol or caffeinated beverages. Taking medicines to make you more alert (stimulants) during the day. Treating any underlying medical causes of hypersomnia. Follow these instructions at home: Sleep routine  Schedule the same bedtime and wake-up time each day. Practice a relaxing bedtime routine. This may include reading, meditation, deep breathing, or taking a warm bath before going to sleep. Get regular exercise each day. Avoid strenuous exercise in the evening hours. Keep your sleep environment at a cooler temperature, darkened, and quiet. Sleep with pillows and a mattress that are comfortable and supportive. Schedule short 20-minute naps for when you feel sleepiest during the day. Talk with your employer or teachers about your hypersomnia. If possible, adjust your schedule so that: You have a regular daytime work schedule. You can take a scheduled nap during the day. You do not have to work or be active at night. Do not eat a heavy meal for a few hours before bedtime. Eat your meals at about the same times every day. Avoid drinking alcohol or caffeinated beverages.  Safety  Do not drive or use heavy machinery if you are sleepy. Ask your health care provider if it is safe for you to drive. Wear a life jacket when swimming or spending time near water.  General instructions Take supplements and over-the-counter and prescription medicines only as told by your health care provider. Keep a sleep log that will help your doctor manage your condition. This may include information about: What time you go to bed each night. How often you wake up at night. How many hours you sleep at night. How often and for how long you nap during the   day. Any observations from others, such as leg movements during sleep, sleep walking, or snoring. Keep all follow-up visits as told by your health care provider. This is  important. Contact a health care provider if: You have new symptoms. Your symptoms get worse. Get help right away if: You have serious thoughts about hurting yourself or someone else. If you ever feel like you may hurt yourself or others, or have thoughts about taking your own life, get help right away. You can go to your nearest emergency department or call: Your local emergency services (911 in the [place] A suicide crisis helpline, such as the National Suicide Prevention Lifeline at 1-800-273-8255. This is open 24 hours a day. Summary Hypersomnia refers to a condition in which you feel very tired during the day even though you get plenty of sleep at night. A person with this condition may take naps during the day and may find it very difficult to wake up from sleep. Hypersomnia may affect a person's ability to think, concentrate, drive, or remember things. Treatment, such as following a regular sleep routine and making some lifestyle changes, can help you manage your condition. This information is not intended to replace advice given to you by your health care provider. Make sure you discuss any questions you have with your healthcare provider. Document Revised: 11/08/2019 Document Reviewed: 11/08/2019 Elsevier Patient Education  2022 [employer]  

## 2020-09-11 NOTE — Progress Notes (Signed)
Provider:  Larey Seat, M D  Referring Provider: Ezequiel Essex, MD Primary Care Physician:  Ezequiel Essex, MD    HPI:  Karina [name] is a 35 y.o. female  and seen in a RV today. 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  She had undergone surgery but also developed cataracts as a side effect and just recently had those removed.  She is still feeling that her vision is progressively clearing up at this time.  When I have my last visit with her had ordered an MRI of the brain with and without contrast because she had presented also with headaches and dizziness.  Cerebellum and brainstem appears normal there was normal flow and normal brain volume no scars no trauma no strokes.  Also important her paranasal sinuses were clear.  Paranasal sinuses congestion often creates headaches.  The patient had undergone a nocturnal polysomnography and this was a surprising finding.  She was seen for baseline polysomnogram attended on 07-30-2019 she had by the time overcome COVID 6 months earlier.  She had 169 respiratory events throughout the night.  Her AHI was 29.7 moderate to severe in rem sleep her apnea index became 68.3.  She also had significant oxygen desaturation actually her nadir was 49% and the time spent below 89% oxygen saturation equal to 64 minutes during the night.  Her EKG remarkably kept in normal sinus rhythm.    So we ordered an auto titration device for the patient between 6 and 18 cmH2O was 2 cm EPR .   She was finally set up on 07-08-2020.  So 11 months after her sleep study.  Her compliance for days is 24 out of 30 days or 80% but she has not made it to 4 hours each night.  She still has to work on that.  Average user time right now is 2 hours and 52 minutes to see if he should add at least an hour and a half.  The minimum pressure was 6 maximum pressure  18 and an EPR level of 2 cm with a residual AHI of 0.9/h so this patient went from almost 30 apneas per hour to 0.9 her 95th percentile pressure is 12.8 cmH2O well covered within her current settings.  Her 95th percentile air leak is moderate to high at 23.8 L/min again this is a question of comfort we can always change the mask or refit for a different interface.   E discussed compliance barriers. She was fully vaccinated  and still has had  COVID again in JULY 2022, herr second time,and sinus pressure and coughing - she was treated with Pavloxid . she has been coached by hr DME to improve compliance. She changed from a FFM to nasal piece and likes this pillow.       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 The patient also contracted COVID-19 and stated that she had a longstanding dry cough for which she has followed up with Dr. Melvyn Novas.  Her diabetes is uncontrolled and has been for month and years- see epic.  She is here upon referral by her eye doctor, and looking at her last visit with the patient her primary doctor is Maia Breslow whom she has not seen since early June.  She was seen at [facility] on 19 June 2019 Shirleen Schirmer, [place] evaluated the patient there stating that she had since Sunday a headache but he could not find ophthalmologic changes explaining the pain and therefore decided to referral to neurology as primary care was not available.  She has recently canceled an appointment with her retinal specialist but is going to follow-up.  She has seen Dr. Melvyn Novas internal medicine-pulmonology.  She is in diabetic care with Dr. Julianne Rice  endocrinologist.  Review of systems from her last visit was good eye care state no neurologic complications or symptoms, particular patient has a history of myopia, astigmatism, she is on Systane 4 times a day, she has macular edema (!)  moderate nonproliferative diabetic retinopathy dry eye contributing to conjunctivitis and meibomian gland dysfunction.  She is an insulin-dependent diabetes type 2 patient.  The patient experienced on June 6 and new symptom onset is a left-sided headache with retro-orbital pain pressure and blurring of vision.  She called her ophthalmologist and could be seen on Tuesday following 8 June.  She is wearing a eye patch he has photosensitivity and light hurts her eyes.  She is straining to see with the left. The patient's pupil respect bilaterally to light and accommodation, she still reports eye pain.  Unlikely that Mrs. Again he had her retinal images saved to her smart phone from June 9.  Shows clearly quite significant edematous changes, a post retinal bleed these pictures were obtained by her retinal specialist yes Dr. Deloria Lair.    This is a diabetic macular edema, preretinal hemorrhages subretinal fluid accumulation macular swelling.  Aside from the obvious risk factor of ocular manifestations of diabetes type 2 of the degree of control and not sure the second stabilizing treatment is to evaluate for obstructive sleep apnea and make sure that she does not have a contribution of ocular pressure from obstructive sleep apnea.  Usually Dr. Mateo Flow will refer directly to [place] for a sleep study but in this case I understand that she is going to be evaluated in August (!) By [facility] pulmonology.  I would like to add that the  HbA1 c was 12.2 (!) on 06-21-2019.   She clearly has a left eye vitreous hemorrhage and the bleed is overlying the macula.   She is post Covid 19 was infected in early January.     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) Proliferative diabetic retinopathy of left eye (HCC) Vitreous hemorrhage of left eye (HCC) DOE (dyspnea on exertion) Tachycardia Nephrotic syndrome due to diabetes mellitus (HCC) History of COVID-19 Upper airway cough syndrome Shortness of breath Acute pain of right shoulder COVID-19 Back pain Lower extremity edema Notalgia Essential hypertension, benign Morbid obesity ([place]) Type 2 diabetes mellitus with both eyes affected by moderate nonproliferative retinopathy and macular edema, with long-term current use of insulin ([place]) Depression  Review of Systems: Out of a complete 14 system review, the patient complains of only the following symptoms, and all other reviewed systems are negative.  eye pain, retro-orbital pressure sensation, photophobia. , LOUD SNORER, morbidly obese. Non-controlled DM2.    How likely are you to doze in the following situations: 0 =  not likely, 1 = slight chance, 2 = moderate chance, 3 = high chance  Sitting and Reading? Watching Television? Sitting inactive in a public place (theater or meeting)? Lying down in the afternoon when circumstances permit? Sitting and talking to someone? Sitting quietly after lunch without alcohol? In a car, while stopped for a few minutes in traffic? As a passenger in a car for an hour without a break?  Total =22/ 24  FSS at 58/ 63 points all on CPAP , but not compliant, and post COVID in July 2022.    Social History   Socioeconomic History   Marital status: Single    Spouse name: Not on file   Number of children: Not on file   Years of education: Not on file   Highest education level: Not on file  Occupational History   Not on file  Tobacco Use   Smoking status: Never   Smokeless tobacco: Never  Vaping Use   Vaping Use: Never used  Substance and Sexual Activity   Alcohol use: No    Drug use: No   Sexual activity: Not Currently    Birth control/protection: None  Other Topics Concern   Not on file  Social History Narrative   Not on file   Social Determinants of Health   Financial Resource Strain: Not on file  Food Insecurity: Not on file  Transportation Needs: Not on file  Physical Activity: Not on file  Stress: Not on file  Social Connections: Not on file  Intimate Partner Violence: Not on file    Family History  Problem Relation Age of Onset   Diabetes Father    Stomach cancer Father    Cirrhosis Father    Cancer Mother    Diabetes Mother    Breast cancer Mother    Rectal cancer Neg Hx    Esophageal cancer Neg Hx    Colon cancer Neg Hx     Past Medical History:  Diagnosis Date   Acute pain of right shoulder 03/13/2019   Back pain 12/12/2017   Carpal tunnel syndrome of right wrist 03/13/2019   Depression 12/27/2012   Diabetes mellitus, uncontrolled     Type 2, insulin resistant   Diabetic type II diabetes mellitus ([place]) 05/23/2013   DOE (dyspnea on exertion) 05/21/2019   Onset with covid 19 infection symptoms started 01/16/19  -  05/23/2019   Walked RA  3 laps @ approx 279f each @ moderat pace  stopped due to end of study,  no limiting sob/ sats 100% at end despite reported sob at rest    Essential hypertension, benign 12/12/2013   History of COVID-19 03/22/2019   Lower extremity edema 08/29/2017   Moderate nonproliferative diabetic retinopathy ([facility] 05/24/2013   Both OS and OD.  Diagnosed by Dr. SLoyal Buba MD, PhD 05/23/13. Also mild macular edema. Follows with Dr. GKaty Fitch  Morbid obesity ([facility] 05/25/2013   Nephrotic syndrome due to diabetes mellitus ([place] 04/10/2019   Notalgia 09/11/2015   Shortness of breath 03/22/2019   Syncope 04/08/2015   Tachycardia 05/21/2019   Type 2 diabetes mellitus with both eyes affected by moderate nonproliferative retinopathy and macular edema, with long-term current use of insulin ([facility] 12/27/2012        Upper airway cough  syndrome 03/22/2019    Past Surgical History:  Procedure Laterality Date   CERVICAL BIOPSY  2004   CESAREAN SECTION N/A 06/02/2012   Procedure: CESAREAN SECTION;  Surgeon: PMora Bellman MD;  [facility];  Service: Obstetrics;  Laterality: N/A;   RETINAL DETACHMENT REPAIR W/ SCLERAL BUCKLE LE Left 10/08/2019    Current Outpatient Medications  Medication Sig Dispense Refill   albuterol (VENTOLIN HFA) 108 (90 Base) MCG/ACT inhaler Inhale 1-2 puffs into the lungs every 4 (four) hours as needed for shortness of breath.     atorvastatin (LIPITOR) 80 MG tablet Take 80 mg by mouth daily.     benzonatate (TESSALON) 100 MG capsule Take 2 capsules (200 mg total) by mouth every 8 (eight) hours. 30 capsule 0   Blood Pressure Monitoring (BLOOD PRESSURE MONITOR AUTOMAT) DEVI 1 kit by Does not apply route daily. Please call the office if you have any issues obtaining this monitor. 1 each 0   carvedilol (COREG) 12.5 MG tablet Take 12.5 mg by mouth 2 (two) times daily with a meal.     Cholecalciferol (VITAMIN D3) 50 MCG (2000 UT) CHEW Chew 2,000 Units by mouth daily. 30 tablet 3   Continuous Blood Gluc Sensor (DEXCOM G6 SENSOR) MISC Place one sensor every 10 days. 3 each 11   Continuous Blood Gluc Transmit (DEXCOM G6 TRANSMITTER) MISC Use every 90 days to transmit information from sensor. 1 each 3   Dulaglutide (TRULICITY) 1.5 KZ/9.9JT SOPN Inject 1.5 mg into the skin once a week. 2 mL 0   fluticasone-salmeterol (ADVAIR HFA) 115-21 MCG/ACT inhaler Inhale 2 puffs into the lungs 2 (two) times daily. 1 each 3   HUMALOG KWIKPEN 200 UNIT/ML KwikPen INJECT 20 TO 28 UNITS SUBCUTANEOUSLY THREE TIMES DAILY 12 mL 0   injection device for insulin (INPEN 100-PINK-NOVO) DEVI Use as directed with novolog cartridges 1 each 0   Insulin Pen Needle (B-D ULTRAFINE III SHORT PEN) 31G X 8 MM MISC 1 Container by Does not apply route as needed. 1 each 11   losartan (COZAAR) 100 MG tablet Take 100 mg by mouth daily.      pantoprazole (PROTONIX) 40 MG tablet Take 1 tablet (40 mg total) by mouth daily. 90 tablet 3   TRESIBA FLEXTOUCH 200 UNIT/ML FlexTouch Pen INJECT 50 UNITS SUBCUTANEOUSLY ONCE DAILY FOR 28 DAYS 9 mL 0   No current facility-administered medications for this visit.    Allergies as of 09/11/2020 - Review Complete 08/11/2020  Allergen Reaction Noted   Lisinopril Cough 12/26/2017   Eggs or egg-derived products Nausea And Vomiting 08/24/2019   Contrast media [iodinated diagnostic agents]  07/02/2019    Vitals: BP (!) 168/96   Pulse 95   Ht 5' 2" (1.575 m)   Wt 270 lb (122.5 kg)   BMI 49.38 kg/m  Last Weight:  Wt Readings from Last 1 Encounters:  09/11/20 270 lb (122.5 kg)   Last Height:   Ht Readings from Last 1 Encounters:  09/11/20 5' 2" (1.575 m)    Physical exam:  Intraocular pressure was 15 down form 21 pre CPAP.   General: The patient is awake, alert and appears not in acute distress. The patient is groomed. Head: Normocephalic, atraumatic. Neck is supple. Mallampati 3, neck circumference:19 Cardiovascular:  Regular rate and rhythm , without  murmurs or carotid bruit, and without distended neck veins. Respiratory: Lungs are clear to auscultation. Skin:  Without evidence of edema, or rash Trunk: BMI is 47.74 elevated .  Neurologic exam : The patient is awake and alert, oriented to place and time.  Memory subjective described as intact. There is a normal attention span & concentration ability. Speech is fluent without dysarthria, dysphonia or aphasia. Mood and affect are  aloof.  Cranial nerves: Pupils are equal and briskly reactive to light. Funduscopic exam with evidence of pallor / edema. Extraocular movements  in vertical and horizontal planes intact and without nystagmus.  Visual fields by finger perimetry are intact. She reports abnormal depth perception.  Hearing to finger rub intact.  Facial sensation intact to fine touch. Facial motor strength is symmetric and tongue  and uvula move midline. Tongue protrusion into either cheek is normal.  Shoulder shrug is normal.   Motor exam:   Normal tone ,muscle bulk and symmetric  strength in all extremities. Sensory:  Fine touch, pinprick and vibration were  Coordination: Rapid alternating movements in the fingers/hands were normal. Finger-to-nose maneuver  normal without evidence of ataxia, dysmetria or tremor. Gait and station: Patient walks without assistive device . Deep tendon reflexes: in the  upper and lower extremities are attenuated.  Assessment:  After physical and neurologic examination, review of laboratory studies, imaging, neurophysiology testing and pre-existing records, assessment is that of :  She has had a reduction in intraocular pressure, hypertension.  Postviral fatigue is still present.  NASH Plan:  Treatment plan and additional workup : Continue CPAP as prescribed and with nasal pillow.  ONO overnight while on CPAP.   RV with NP in  2-4 month   Larey Seat MD 09/11/2020

## 2020-09-19 DIAGNOSIS — Z794 Long term (current) use of insulin: Secondary | ICD-10-CM | POA: Diagnosis not present

## 2020-09-19 DIAGNOSIS — E11319 Type 2 diabetes mellitus with unspecified diabetic retinopathy without macular edema: Secondary | ICD-10-CM | POA: Diagnosis not present

## 2020-09-19 DIAGNOSIS — N183 Chronic kidney disease, stage 3 unspecified: Secondary | ICD-10-CM | POA: Diagnosis not present

## 2020-09-19 DIAGNOSIS — E1122 Type 2 diabetes mellitus with diabetic chronic kidney disease: Secondary | ICD-10-CM | POA: Diagnosis not present

## 2020-09-19 DIAGNOSIS — E1165 Type 2 diabetes mellitus with hyperglycemia: Secondary | ICD-10-CM | POA: Diagnosis not present

## 2020-09-19 DIAGNOSIS — E1169 Type 2 diabetes mellitus with other specified complication: Secondary | ICD-10-CM | POA: Diagnosis not present

## 2020-09-19 DIAGNOSIS — E785 Hyperlipidemia, unspecified: Secondary | ICD-10-CM | POA: Diagnosis not present

## 2020-09-19 DIAGNOSIS — E1140 Type 2 diabetes mellitus with diabetic neuropathy, unspecified: Secondary | ICD-10-CM | POA: Diagnosis not present

## 2020-09-19 DIAGNOSIS — Z6841 Body Mass Index (BMI) 40.0 and over, adult: Secondary | ICD-10-CM | POA: Diagnosis not present

## 2020-09-23 DIAGNOSIS — Z794 Long term (current) use of insulin: Secondary | ICD-10-CM | POA: Diagnosis not present

## 2020-09-23 DIAGNOSIS — H26492 Other secondary cataract, left eye: Secondary | ICD-10-CM | POA: Diagnosis not present

## 2020-09-23 DIAGNOSIS — Z79899 Other long term (current) drug therapy: Secondary | ICD-10-CM | POA: Diagnosis not present

## 2020-09-23 DIAGNOSIS — E113522 Type 2 diabetes mellitus with proliferative diabetic retinopathy with traction retinal detachment involving the macula, left eye: Secondary | ICD-10-CM | POA: Diagnosis not present

## 2020-09-23 DIAGNOSIS — Z7952 Long term (current) use of systemic steroids: Secondary | ICD-10-CM | POA: Diagnosis not present

## 2020-09-26 ENCOUNTER — Other Ambulatory Visit: Payer: Self-pay

## 2020-09-26 ENCOUNTER — Ambulatory Visit: Payer: Medicaid Other | Admitting: Podiatry

## 2020-09-26 DIAGNOSIS — E119 Type 2 diabetes mellitus without complications: Secondary | ICD-10-CM

## 2020-09-26 DIAGNOSIS — M792 Neuralgia and neuritis, unspecified: Secondary | ICD-10-CM | POA: Diagnosis not present

## 2020-09-26 MED ORDER — PREGABALIN 75 MG PO CAPS: 75.0000 mg | ORAL_CAPSULE | Freq: Two times a day (BID) | ORAL | 0 refills | Status: DC

## 2020-09-30 NOTE — Progress Notes (Signed)
Subjective: Karina [name] presents today referred by Ezequiel Essex, MD for diabetic foot evaluation.  Patient relates greater than 3 year history of diabetes.  Patient denies any history of foot wounds.  Patient occasional history of numbness, tingling, burning, pins/needles sensations.  Past Medical History:  Diagnosis Date   Acute pain of right shoulder 03/13/2019   Allergy    Angio-edema    Asthma    Back pain 12/12/2017   Carpal tunnel syndrome of right wrist 03/13/2019   COVID-19 01/24/2019   Depression 12/27/2012   Diabetes mellitus    Type 2, insulin resistant   Diabetic neuropathy, type II diabetes mellitus ([place]) 05/23/2013   DOE (dyspnea on exertion) 05/21/2019   Onset with covid 19 infection symptoms started 01/16/19  -  05/23/2019   Walked RA  3 laps @ approx 243f each @ moderat pace  stopped due to end of study,  no limiting sob/ sats 100% at end despite reported sob at rest    Essential hypertension, benign 12/12/2013   GERD (gastroesophageal reflux disease)    History of COVID-19 03/22/2019   Lower extremity edema 08/29/2017   Moderate nonproliferative diabetic retinopathy ([place] 05/24/2013   Both OS and OD.  Diagnosed by Dr. SLoyal Buba MD, PhD 05/23/13. Also mild macular edema. Follows with Dr. GKaty Fitch  Morbid obesity (HEvaro 05/25/2013   Nephrotic syndrome due to diabetes mellitus (HRed[place] 04/10/2019   Notalgia 09/11/2015   Shortness of breath 03/22/2019   Sleep apnea    doesn't have machine yet   Syncope 04/08/2015   Tachycardia 05/21/2019   Type 2 diabetes mellitus with both eyes affected by moderate nonproliferative retinopathy and macular edema, with long-term current use of insulin (HPheasant [place] 12/27/2012        Upper airway cough syndrome 03/22/2019    Patient Active Problem List   Diagnosis Date Noted   Hypersomnia, persistent 09/11/2020   OSA on CPAP 09/11/2020   Uncontrolled type 2 diabetes mellitus with hyperglycemia (HMor[place] 09/11/2020   PVFS (postviral fatigue syndrome)  09/11/2020   Other allergic rhinitis 03/18/2020   Reactive airway disease 03/18/2020   Adverse reaction to food, subsequent encounter 03/18/2020   Heartburn 03/18/2020   Vitamin D deficiency 09/04/2019   Stable proliferative diabetic retinopathy of both eyes associated with type 2 diabetes mellitus (HPort Aransas 08/17/2019   Current mild episode of major depressive disorder (HGren[place] 08/08/2019   Sleep-related headache 07/05/2019   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, drug or chemical induced, with severe nonproliferative retinopathy (HBottine[place] 07/05/2019   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, bilateral (HWest V[place] 07/05/2019   Cephalalgia 07/05/2019   Proliferative diabetic retinopathy of right eye (HH[place] 06/20/2019   Proliferative diabetic retinopathy of left eye (HDeepstep 06/20/2019   Vitreous hemorrhage of left eye (HProtection 06/20/2019   DOE (dyspnea on exertion) 05/21/2019   Tachycardia 05/21/2019   History of COVID-19 03/22/2019   Upper airway cough syndrome 03/22/2019   Shortness of breath 03/22/2019   Carpal tunnel syndrome of right wrist 03/13/2019   Gastroesophageal reflux disease 03/23/2018   Lower extremity edema 08/29/2017   Hypertension associated with stage 2 chronic kidney disease due to type 2 diabetes mellitus (HThompson[place] 12/12/2013   Morbid obesity (HWain[place] 05/25/2013   Type 2 diabetes mellitus with both eyes affected by moderate nonproliferative retinopathy and macular edema, with long-term current use of insulin (HWisconsin Dells 12/27/2012   Depression 12/27/2012    Past Surgical History:  Procedure Laterality Date   CERVICAL BIOPSY  2004  CESAREAN SECTION N/A 06/02/2012   Procedure: CESAREAN SECTION;  Surgeon: Mora Bellman, MD;  Location: [facility];  Service: Obstetrics;  Laterality: N/A;   RETINAL DETACHMENT REPAIR W/ SCLERAL BUCKLE LE Left 10/08/2019    Current Outpatient Medications on File Prior to Visit  Medication Sig Dispense Refill   albuterol  (VENTOLIN HFA) 108 (90 Base) MCG/ACT inhaler Inhale 1-2 puffs into the lungs every 4 (four) hours as needed for shortness of breath.     atorvastatin (LIPITOR) 80 MG tablet Take 80 mg by mouth daily.     benzonatate (TESSALON) 100 MG capsule Take 2 capsules (200 mg total) by mouth every 8 (eight) hours. 30 capsule 0   Blood Pressure Monitoring (BLOOD PRESSURE MONITOR AUTOMAT) DEVI 1 kit by Does not apply route daily. Please call the office if you have any issues obtaining this monitor. 1 each 0   carvedilol (COREG) 12.5 MG tablet Take 12.5 mg by mouth 2 (two) times daily with a meal.     Cholecalciferol (VITAMIN D3) 50 MCG (2000 UT) CHEW Chew 2,000 Units by mouth daily. 30 tablet 3   Continuous Blood Gluc Sensor (DEXCOM G6 SENSOR) MISC Place one sensor every 10 days. 3 each 11   Continuous Blood Gluc Transmit (DEXCOM G6 TRANSMITTER) MISC Use every 90 days to transmit information from sensor. 1 each 3   Dulaglutide (TRULICITY) 1.5 GN/0.0BB SOPN Inject 1.5 mg into the skin once a week. 2 mL 0   fluticasone-salmeterol (ADVAIR HFA) 115-21 MCG/ACT inhaler Inhale 2 puffs into the lungs 2 (two) times daily. 1 each 3   HUMALOG KWIKPEN 200 UNIT/ML KwikPen INJECT 20 TO 28 UNITS SUBCUTANEOUSLY THREE TIMES DAILY 12 mL 0   injection device for insulin (INPEN 100-PINK-NOVO) DEVI Use as directed with novolog cartridges 1 each 0   Insulin Pen Needle (B-D ULTRAFINE III SHORT PEN) 31G X 8 MM MISC 1 Container by Does not apply route as needed. 1 each 11   losartan (COZAAR) 100 MG tablet Take 100 mg by mouth daily.     pantoprazole (PROTONIX) 40 MG tablet Take 1 tablet (40 mg total) by mouth daily. 90 tablet 3   TRESIBA FLEXTOUCH 200 UNIT/ML FlexTouch Pen INJECT 50 UNITS SUBCUTANEOUSLY ONCE DAILY FOR 28 DAYS 9 mL 0   [DISCONTINUED] gabapentin (NEURONTIN) 100 MG capsule Take 1 capsule (100 mg total) by mouth at bedtime. 90 capsule 3   No current facility-administered medications on file prior to visit.     Allergies   Allergen Reactions   Lisinopril Cough   Eggs Or Egg-Derived Products Nausea And Vomiting   Contrast Media [Iodinated Diagnostic Agents]     Social History   Occupational History   Not on file  Tobacco Use   Smoking status: Never   Smokeless tobacco: Never  Vaping Use   Vaping Use: Never used  Substance and Sexual Activity   Alcohol use: No   Drug use: No   Sexual activity: Not Currently    Birth control/protection: None    Family History  Problem Relation Age of Onset   Diabetes Father    Stomach cancer Father    Cirrhosis Father    Cancer Mother    Diabetes Mother    Breast cancer Mother    Rectal cancer Neg Hx    Esophageal cancer Neg Hx    Colon cancer Neg Hx     Immunization History  Administered Date(s) Administered   Influenza Split 12/20/2011   Influenza,inj,Quad PF,6+ Mos 11/10/2015  MMR 06/03/2012   PFIZER(Purple Top)SARS-COV-2 Vaccination 04/04/2019, 04/25/2019   Tdap 03/13/2012    Review of systems: Positive Findings in bold print.  Constitutional:  chills, fatigue, fever, sweats, weight change Communication: Optometrist, sign Ecologist, hand writing, iPad/Android device Head: headaches, head injury Eyes: changes in vision, eye pain, glaucoma, cataracts, macular degeneration, diplopia, glare,  light sensitivity, eyeglasses or contacts, blindness Ears nose mouth throat: hearing impaired, hearing aids,  ringing in ears, deaf, sign language,  vertigo, nosebleeds,  rhinitis,  cold sores, snoring, swollen glands Cardiovascular: HTN, edema, arrhythmia, pacemaker in place, defibrillator in place, chest pain/tightness, chronic anticoagulation, blood clot, heart failure, MI Peripheral Vascular: leg cramps, varicose veins, blood clots, lymphedema, varicosities Respiratory:  asthma, difficulty breathing, denies congestion, SOB, wheezing, cough, emphysema Gastrointestinal: change in appetite or weight, abdominal pain, constipation, diarrhea, nausea,  vomiting, vomiting blood, change in bowel habits, abdominal pain, jaundice, rectal bleeding, hemorrhoids, GERD Genitourinary:  nocturia,  pain on urination, polyuria,  blood in urine, Foley catheter, urinary urgency, ESRD on hemodialysis Musculoskeletal: amputation, cramping, stiff joints, painful joints, decreased joint motion, fractures, OA, gout, hemiplegia, paraplegia, uses cane, wheelchair bound, uses walker, uses rollator Skin: +changes in toenails, color change, dryness, itching, mole changes,  rash, wound(s) Neurological: headaches, numbness in feet, paresthesias in feet, burning in feet, fainting,  seizures, change in speech, migraines, memory problems/poor historian, cerebral palsy, weakness, paralysis, CVA, TIA Endocrine: diabetes, hypothyroidism, hyperthyroidism,  goiter, dry mouth, flushing, heat intolerance, cold intolerance,  excessive thirst, denies polyuria,  nocturia Hematological:  easy bleeding, excessive bleeding, easy bruising, enlarged lymph nodes, on long term blood thinner, history of past transusions Allergy/immunological:  hives, eczema, frequent infections, multiple drug allergies, seasonal allergies, transplant recipient, multiple food allergies Psychiatric:  anxiety, depression, mood disorder, suicidal ideations, hallucinations, insomnia  Objective: There were no vitals filed for this visit. Vascular Examination: Capillary refill time less than 3 seconds x 10 digits.  Dorsalis pedis pulses palpable.  Posterior tibial pulses palpable.  Digital hair present x 10 digits.  Skin temperature gradient WNL b/l.  Dermatological Examination: Skin with normal turgor, texture and tone b/l  Toenails 1-5 b/l discolored, thick, dystrophic with subungual debris and pain with palpation to nailbeds due to thickness of nails.  Musculoskeletal: Muscle strength 5/5 to all LE muscle groups.  Neurological: Diminished intact with 10 gram monofilament.  More subjective  neuropathic pain is experienced.  Vibratory sensation diminished  Assessment: NIDDM Encounter for diabetic foot examination Neuropathic pain  Plan: Discussed diabetic foot care principles. Literature dispensed on today. Patient to continue soft, supportive shoe gear daily. Patient to report any pedal injuries to medical professional immediately. Follow up 3 months Patient/POA to call should there be a concern in the interim. I dispensed Lyrica 75 mg to help control her neuropathic pain if there is improvement patient can proceed obtaining more of it from primary care physician.  She states understanding.

## 2020-10-08 DIAGNOSIS — Z961 Presence of intraocular lens: Secondary | ICD-10-CM | POA: Diagnosis not present

## 2020-10-08 DIAGNOSIS — H26492 Other secondary cataract, left eye: Secondary | ICD-10-CM | POA: Diagnosis not present

## 2020-10-08 DIAGNOSIS — Z48810 Encounter for surgical aftercare following surgery on the sense organs: Secondary | ICD-10-CM | POA: Diagnosis not present

## 2020-10-09 DIAGNOSIS — Z961 Presence of intraocular lens: Secondary | ICD-10-CM | POA: Diagnosis not present

## 2020-10-09 DIAGNOSIS — H2513 Age-related nuclear cataract, bilateral: Secondary | ICD-10-CM | POA: Diagnosis not present

## 2020-10-09 DIAGNOSIS — Z794 Long term (current) use of insulin: Secondary | ICD-10-CM | POA: Diagnosis not present

## 2020-10-09 DIAGNOSIS — H0288B Meibomian gland dysfunction left eye, upper and lower eyelids: Secondary | ICD-10-CM | POA: Diagnosis not present

## 2020-10-09 DIAGNOSIS — G4733 Obstructive sleep apnea (adult) (pediatric): Secondary | ICD-10-CM | POA: Diagnosis not present

## 2020-10-09 DIAGNOSIS — H0288A Meibomian gland dysfunction right eye, upper and lower eyelids: Secondary | ICD-10-CM | POA: Diagnosis not present

## 2020-10-09 DIAGNOSIS — Z79899 Other long term (current) drug therapy: Secondary | ICD-10-CM | POA: Diagnosis not present

## 2020-10-09 DIAGNOSIS — E113551 Type 2 diabetes mellitus with stable proliferative diabetic retinopathy, right eye: Secondary | ICD-10-CM | POA: Diagnosis not present

## 2020-10-09 DIAGNOSIS — E113522 Type 2 diabetes mellitus with proliferative diabetic retinopathy with traction retinal detachment involving the macula, left eye: Secondary | ICD-10-CM | POA: Diagnosis not present

## 2020-10-13 DIAGNOSIS — F4325 Adjustment disorder with mixed disturbance of emotions and conduct: Secondary | ICD-10-CM | POA: Diagnosis not present

## 2020-11-08 DIAGNOSIS — G4733 Obstructive sleep apnea (adult) (pediatric): Secondary | ICD-10-CM | POA: Diagnosis not present

## 2020-11-10 DIAGNOSIS — F4325 Adjustment disorder with mixed disturbance of emotions and conduct: Secondary | ICD-10-CM | POA: Diagnosis not present

## 2020-11-13 ENCOUNTER — Telehealth: Payer: Medicaid Other | Admitting: Physician Assistant

## 2020-11-13 DIAGNOSIS — M5441 Lumbago with sciatica, right side: Secondary | ICD-10-CM

## 2020-11-13 DIAGNOSIS — R109 Unspecified abdominal pain: Secondary | ICD-10-CM

## 2020-11-13 NOTE — Progress Notes (Signed)
Virtual Visit Consent   [name], you are scheduled for a virtual visit with a [facility] provider today.     Just as with appointments in the office, your consent must be obtained to participate.  Your consent will be active for this visit and any virtual visit you may have with one of our providers in the next 365 days.     If you have a MyChart account, a copy of this consent can be sent to you electronically.  All virtual visits are billed to your insurance company just like a traditional visit in the office.    As this is a virtual visit, video technology does not allow for your provider to perform a traditional examination.  This may limit your provider's ability to fully assess your condition.  If your provider identifies any concerns that need to be evaluated in person or the need to arrange testing (such as labs, EKG, etc.), we will make arrangements to do so.     Although advances in technology are sophisticated, we cannot ensure that it will always work on either your end or our end.  If the connection with a video visit is poor, the visit may have to be switched to a telephone visit.  With either a video or telephone visit, we are not always able to ensure that we have a secure connection.     I need to obtain your verbal consent now.   Are you willing to proceed with your visit today?    [name] has provided verbal consent on 11/13/2020 for a virtual visit (video or telephone).   Mar Daring, PA-C   Date: 11/13/2020 2:24 PM   Virtual Visit via Video Note   I, Mar Daring, connected with  [name]  ([id number], 05-Mar-1985) on 11/13/20 at  2:15 PM EDT by a video-enabled telemedicine application and verified that I am speaking with the correct person using two identifiers.  Location: Patient: Virtual Visit Location Patient: Home Provider: Virtual Visit Location Provider: Home Office   I discussed the limitations of evaluation and  management by telemedicine and the availability of in person appointments. The patient expressed understanding and agreed to proceed.    History of Present Illness: Karina [name] is a 35 y.o. who identifies as a female who was assigned female at birth, and is being seen today for right side back pain. Reports started yesterday and is constant. Having urine frequency and "foamy" urine. Denies hematuria. Reports pain is radiating around to stomach and down right leg making it difficult to walk. She does have CKD 3 and T2DM.   Problems:  Patient Active Problem List   Diagnosis Date Noted   Hypersomnia, persistent 09/11/2020   OSA on CPAP 09/11/2020   Uncontrolled type 2 diabetes mellitus with hyperglycemia ([place]) 09/11/2020   PVFS (postviral fatigue syndrome) 09/11/2020   Other allergic rhinitis 03/18/2020   Reactive airway disease 03/18/2020   Adverse reaction to food, subsequent encounter 03/18/2020   Heartburn 03/18/2020   Vitamin D deficiency 09/04/2019   Stable proliferative diabetic retinopathy of both eyes associated with type 2 diabetes mellitus ([place]) 08/17/2019   Current mild episode of major depressive disorder ([place]) 08/08/2019   Sleep-related headache 07/05/2019   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, drug or chemical induced, with severe nonproliferative retinopathy ([place]) 07/05/2019   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, bilateral ([place]) 07/05/2019   Cephalalgia 07/05/2019   Proliferative  diabetic retinopathy of right eye ([place]) 06/20/2019   Proliferative diabetic retinopathy of left eye ([place]) 06/20/2019   Vitreous hemorrhage of left eye ([place]) 06/20/2019   DOE (dyspnea on exertion) 05/21/2019   Tachycardia 05/21/2019   History of COVID-19 03/22/2019   Upper airway cough syndrome 03/22/2019   Shortness of breath 03/22/2019   Carpal tunnel syndrome of right wrist 03/13/2019   Gastroesophageal reflux disease 03/23/2018   Lower  extremity edema 08/29/2017   Hypertension associated with stage 2 chronic kidney disease due to type 2 diabetes mellitus ([place]) 12/12/2013   Morbid obesity ([place]) 05/25/2013   Type 2 diabetes mellitus with both eyes affected by moderate nonproliferative retinopathy and macular edema, with long-term current use of insulin ([place]) 12/27/2012   Depression 12/27/2012    Allergies:  Allergies  Allergen Reactions   Lisinopril Cough   Eggs Or Egg-Derived Products Nausea And Vomiting   Contrast Media [Iodinated Diagnostic Agents]    Medications:  Current Outpatient Medications:    albuterol (VENTOLIN HFA) 108 (90 Base) MCG/ACT inhaler, Inhale 1-2 puffs into the lungs every 4 (four) hours as needed for shortness of breath., Disp: , Rfl:    atorvastatin (LIPITOR) 80 MG tablet, Take 80 mg by mouth daily., Disp: , Rfl:    benzonatate (TESSALON) 100 MG capsule, Take 2 capsules (200 mg total) by mouth every 8 (eight) hours., Disp: 30 capsule, Rfl: 0   Blood Pressure Monitoring (BLOOD PRESSURE MONITOR AUTOMAT) DEVI, 1 kit by Does not apply route daily. Please call the office if you have any issues obtaining this monitor., Disp: 1 each, Rfl: 0   carvedilol (COREG) 12.5 MG tablet, Take 12.5 mg by mouth 2 (two) times daily with a meal., Disp: , Rfl:    Cholecalciferol (VITAMIN D3) 50 MCG (2000 UT) CHEW, Chew 2,000 Units by mouth daily., Disp: 30 tablet, Rfl: 3   Continuous Blood Gluc Sensor (DEXCOM G6 SENSOR) MISC, Place one sensor every 10 days., Disp: 3 each, Rfl: 11   Continuous Blood Gluc Transmit (DEXCOM G6 TRANSMITTER) MISC, Use every 90 days to transmit information from sensor., Disp: 1 each, Rfl: 3   Dulaglutide (TRULICITY) 1.5 MB/8.4YK SOPN, Inject 1.5 mg into the skin once a week., Disp: 2 mL, Rfl: 0   fluticasone-salmeterol (ADVAIR HFA) 115-21 MCG/ACT inhaler, Inhale 2 puffs into the lungs 2 (two) times daily., Disp: 1 each, Rfl: 3   HUMALOG KWIKPEN 200 UNIT/ML KwikPen, INJECT 20 TO 28 UNITS  SUBCUTANEOUSLY THREE TIMES DAILY, Disp: 12 mL, Rfl: 0   injection device for insulin (INPEN 100-PINK-NOVO) DEVI, Use as directed with novolog cartridges, Disp: 1 each, Rfl: 0   Insulin Pen Needle (B-D ULTRAFINE III SHORT PEN) 31G X 8 MM MISC, 1 Container by Does not apply route as needed., Disp: 1 each, Rfl: 11   losartan (COZAAR) 100 MG tablet, Take 100 mg by mouth daily., Disp: , Rfl:    pantoprazole (PROTONIX) 40 MG tablet, Take 1 tablet (40 mg total) by mouth daily., Disp: 90 tablet, Rfl: 3   pregabalin (LYRICA) 75 MG capsule, Take 1 capsule (75 mg total) by mouth 2 (two) times daily., Disp: 60 capsule, Rfl: 0   TRESIBA FLEXTOUCH 200 UNIT/ML FlexTouch Pen, INJECT 50 UNITS SUBCUTANEOUSLY ONCE DAILY FOR 28 DAYS, Disp: 9 mL, Rfl: 0  Observations/Objective: Patient is well-developed, well-nourished in no acute distress.  Resting comfortably at home.  Head is normocephalic, atraumatic.  No labored breathing.  Speech is clear and coherent with logical content.  Patient is alert  and oriented at baseline.    Assessment and Plan: 1. Acute right flank pain  2. Acute right-sided low back pain with right-sided sciatica  - Patient referred to be seen in person at local UC to r/o pyelonephritis vs kidney stone vs MSK back injury  Follow Up Instructions: I discussed the assessment and treatment plan with the patient. The patient was provided an opportunity to ask questions and all were answered. The patient agreed with the plan and demonstrated an understanding of the instructions.  A copy of instructions were sent to the patient via MyChart unless otherwise noted below.    The patient was advised to call back or seek an in-person evaluation if the symptoms worsen or if the condition fails to improve as anticipated.  Time:  I spent 10 minutes with the patient via telehealth technology discussing the above problems/concerns.    Mar Daring, PA-C

## 2020-11-13 NOTE — Addendum Note (Signed)
Addended by: Margaretann Loveless on: 11/13/2020 02:27 PM   Modules accepted: Level of Service

## 2020-11-13 NOTE — Patient Instructions (Signed)
Rosalynn Val [facility], thank you for joining Mar Daring, PA-C for today's virtual visit.  While this provider is not your primary care provider (PCP), if your PCP is located in our provider database this encounter information will be shared with them immediately following your visit.  Consent: (Patient) Karina [name] provided verbal consent for this virtual visit at the beginning of the encounter.  Current Medications:  Current Outpatient Medications:    albuterol (VENTOLIN HFA) 108 (90 Base) MCG/ACT inhaler, Inhale 1-2 puffs into the lungs every 4 (four) hours as needed for shortness of breath., Disp: , Rfl:    atorvastatin (LIPITOR) 80 MG tablet, Take 80 mg by mouth daily., Disp: , Rfl:    benzonatate (TESSALON) 100 MG capsule, Take 2 capsules (200 mg total) by mouth every 8 (eight) hours., Disp: 30 capsule, Rfl: 0   Blood Pressure Monitoring (BLOOD PRESSURE MONITOR AUTOMAT) DEVI, 1 kit by Does not apply route daily. Please call the office if you have any issues obtaining this monitor., Disp: 1 each, Rfl: 0   carvedilol (COREG) 12.5 MG tablet, Take 12.5 mg by mouth 2 (two) times daily with a meal., Disp: , Rfl:    Cholecalciferol (VITAMIN D3) 50 MCG (2000 UT) CHEW, Chew 2,000 Units by mouth daily., Disp: 30 tablet, Rfl: 3   Continuous Blood Gluc Sensor (DEXCOM G6 SENSOR) MISC, Place one sensor every 10 days., Disp: 3 each, Rfl: 11   Continuous Blood Gluc Transmit (DEXCOM G6 TRANSMITTER) MISC, Use every 90 days to transmit information from sensor., Disp: 1 each, Rfl: 3   Dulaglutide (TRULICITY) 1.5 JI/9.6VE SOPN, Inject 1.5 mg into the skin once a week., Disp: 2 mL, Rfl: 0   fluticasone-salmeterol (ADVAIR HFA) 115-21 MCG/ACT inhaler, Inhale 2 puffs into the lungs 2 (two) times daily., Disp: 1 each, Rfl: 3   HUMALOG KWIKPEN 200 UNIT/ML KwikPen, INJECT 20 TO 28 UNITS SUBCUTANEOUSLY THREE TIMES DAILY, Disp: 12 mL, Rfl: 0   injection device for insulin (INPEN 100-PINK-NOVO) DEVI, Use as  directed with novolog cartridges, Disp: 1 each, Rfl: 0   Insulin Pen Needle (B-D ULTRAFINE III SHORT PEN) 31G X 8 MM MISC, 1 Container by Does not apply route as needed., Disp: 1 each, Rfl: 11   losartan (COZAAR) 100 MG tablet, Take 100 mg by mouth daily., Disp: , Rfl:    pantoprazole (PROTONIX) 40 MG tablet, Take 1 tablet (40 mg total) by mouth daily., Disp: 90 tablet, Rfl: 3   pregabalin (LYRICA) 75 MG capsule, Take 1 capsule (75 mg total) by mouth 2 (two) times daily., Disp: 60 capsule, Rfl: 0   TRESIBA FLEXTOUCH 200 UNIT/ML FlexTouch Pen, INJECT 50 UNITS SUBCUTANEOUSLY ONCE DAILY FOR 28 DAYS, Disp: 9 mL, Rfl: 0   Medications ordered in this encounter:  No orders of the defined types were placed in this encounter.    *If you need refills on other medications prior to your next appointment, please contact your pharmacy*  Follow-Up: Call back or seek an in-person evaluation if the symptoms worsen or if the condition fails to improve as anticipated.  Other Instructions  Based on what you shared with me, I feel your condition warrants further evaluation and I recommend that you be seen in a face to face visit.   If you are having a true medical emergency please call 911.      For an urgent face to face visit, [facility] has six urgent care centers for your convenience:     [facility] at  Get Driving Directions [phone] [address] [place], [place] [address]    [facility]) Get Driving Directions [phone] [place], [place] [address]  [facility] Get Driving Directions [phone] [address] [place],  [place]  [address]  [facility] Get Driving Directions [phone] [address] [place], [place], [place] [address]   [facility] Get  Driving Directions  [phone] [address] [place], [place] [address]   [facility] Get Driving Directions [phone] [address] [place], [place] [address]   If you have been instructed to have an in-person evaluation today at a local Urgent Care facility, please use the link below. It will take you to a list of all of our available [facility], including address, phone number and hours of operation. Please do not delay care.  [facility]  If you or a family member do not have a primary care provider, use the link below to schedule a visit and establish care. When you choose a [facility] primary care physician or advanced practice provider, you gain a long-term partner in health. Find a Primary Care Provider  Learn more about [facility]'s in-office and virtual care options: [facility]

## 2020-11-14 ENCOUNTER — Other Ambulatory Visit: Payer: Self-pay

## 2020-11-14 ENCOUNTER — Ambulatory Visit
Admission: EM | Admit: 2020-11-14 | Discharge: 2020-11-14 | Disposition: A | Discharge status: Home / Self Care | Payer: Medicaid Other | Attending: Physician Assistant | Admitting: Physician Assistant

## 2020-11-14 ENCOUNTER — Encounter (HOSPITAL_BASED_OUTPATIENT_CLINIC_OR_DEPARTMENT_OTHER): Payer: Self-pay

## 2020-11-14 ENCOUNTER — Emergency Department (HOSPITAL_BASED_OUTPATIENT_CLINIC_OR_DEPARTMENT_OTHER)
Admission: EM | Admit: 2020-11-14 | Discharge: 2020-11-14 | Disposition: A | Discharge status: Home / Self Care | Payer: Medicaid Other | Attending: Emergency Medicine | Admitting: Emergency Medicine

## 2020-11-14 DIAGNOSIS — R809 Proteinuria, unspecified: Secondary | ICD-10-CM | POA: Diagnosis not present

## 2020-11-14 DIAGNOSIS — R109 Unspecified abdominal pain: Secondary | ICD-10-CM

## 2020-11-14 DIAGNOSIS — N183 Chronic kidney disease, stage 3 unspecified: Secondary | ICD-10-CM | POA: Insufficient documentation

## 2020-11-14 DIAGNOSIS — Z5321 Procedure and treatment not carried out due to patient leaving prior to being seen by health care provider: Secondary | ICD-10-CM | POA: Diagnosis not present

## 2020-11-14 LAB — COMPREHENSIVE METABOLIC PANEL
ALT: 18 U/L (ref 0–44)
AST: 16 U/L (ref 15–41)
Albumin: 3 g/dL — ABNORMAL LOW (ref 3.5–5.0)
Alkaline Phosphatase: 122 U/L (ref 38–126)
Anion gap: 7 (ref 5–15)
BUN: 20 mg/dL (ref 6–20)
CO2: 25 mmol/L (ref 22–32)
Calcium: 8.9 mg/dL (ref 8.9–10.3)
Chloride: 105 mmol/L (ref 98–111)
Creatinine, Ser: 2.28 mg/dL — ABNORMAL HIGH (ref 0.44–1.00)
GFR, Estimated: 28 mL/min — ABNORMAL LOW (ref >60)
Glucose, Bld: 127 mg/dL — ABNORMAL HIGH (ref 70–99)
Potassium: 3.6 mmol/L (ref 3.5–5.1)
Sodium: 137 mmol/L (ref 135–145)
Total Bilirubin: 0.2 mg/dL — ABNORMAL LOW (ref 0.3–1.2)
Total Protein: 7 g/dL (ref 6.5–8.1)

## 2020-11-14 LAB — CBC WITH DIFFERENTIAL/PLATELET
Abs Immature Granulocytes: 0.01 10*3/uL (ref 0.00–0.07)
Basophils Absolute: 0 10*3/uL (ref 0.0–0.1)
Basophils Relative: 1 %
Eosinophils Absolute: 0.1 10*3/uL (ref 0.0–0.5)
Eosinophils Relative: 2 %
HCT: 30.2 % — ABNORMAL LOW (ref 36.0–46.0)
Hemoglobin: 10 g/dL — ABNORMAL LOW (ref 12.0–15.0)
Immature Granulocytes: 0 %
Lymphocytes Relative: 32 %
Lymphs Abs: 1.8 10*3/uL (ref 0.7–4.0)
MCH: 26.8 pg (ref 26.0–34.0)
MCHC: 33.1 g/dL (ref 30.0–36.0)
MCV: 81 fL (ref 80.0–100.0)
Monocytes Absolute: 0.3 10*3/uL (ref 0.1–1.0)
Monocytes Relative: 5 %
Neutro Abs: 3.3 10*3/uL (ref 1.7–7.7)
Neutrophils Relative %: 60 %
Platelets: 417 10*3/uL — ABNORMAL HIGH (ref 150–400)
RBC: 3.73 MIL/uL — ABNORMAL LOW (ref 3.87–5.11)
RDW: 13.7 % (ref 11.5–15.5)
WBC: 5.5 10*3/uL (ref 4.0–10.5)
nRBC: 0 % (ref 0.0–0.2)

## 2020-11-14 LAB — POCT URINALYSIS DIP (MANUAL ENTRY)
Bilirubin, UA: NEGATIVE
Glucose, UA: 100 mg/dL — AB
Ketones, POC UA: NEGATIVE mg/dL
Leukocytes, UA: NEGATIVE
Nitrite, UA: NEGATIVE
Protein Ur, POC: >=300 mg/dL — AB
Spec Grav, UA: 1.025 (ref 1.010–1.025)
Urobilinogen, UA: 0.2 E.U./dL
pH, UA: 7 (ref 5.0–8.0)

## 2020-11-14 NOTE — Discharge Instructions (Signed)
  Please report to ED for further evaluation.  

## 2020-11-14 NOTE — ED Provider Notes (Signed)
[facility]    CSN: [id number] Arrival date & time: 11/14/20  1731      History   Chief Complaint Chief Complaint  Patient presents with   Flank Pain    right    HPI Karina [name] is a 35 y.o. female.   Patient here today for evaluation of right flank pain that started about 3 days ago.  She reports she is also had urinary urgency.  She reports that urine is also looked more foamy than usual.  She has not had fever or chills.  She does not report any treatment for symptoms.  She does have history of stage III CKD.  The history is provided by the patient.  Flank Pain Pertinent negatives include no abdominal pain.   Past Medical History:  Diagnosis Date   Acute pain of right shoulder 03/13/2019   Allergy    Angio-edema    Asthma    Back pain 12/12/2017   Carpal tunnel syndrome of right wrist 03/13/2019   COVID-19 01/24/2019   Depression 12/27/2012   Diabetes mellitus    Type 2, insulin resistant   Diabetic neuropathy, type II diabetes mellitus ([place]) 05/23/2013   DOE (dyspnea on exertion) 05/21/2019   Onset with covid 19 infection symptoms started 01/16/19  -  05/23/2019   Walked RA  3 laps @ approx 289ft each @ moderat pace  stopped due to end of study,  no limiting sob/ sats 100% at end despite reported sob at rest    Essential hypertension, benign 12/12/2013   GERD (gastroesophageal reflux disease)    History of COVID-19 03/22/2019   Lower extremity edema 08/29/2017   Moderate nonproliferative diabetic retinopathy ([place]) 05/24/2013   Both OS and OD.  Diagnosed by Dr. Loyal Buba, MD, PhD 05/23/13. Also mild macular edema. Follows with Dr. Katy Fitch   Morbid obesity ([place]) 05/25/2013   Nephrotic syndrome due to diabetes mellitus ([place]) 04/10/2019   Notalgia 09/11/2015   Shortness of breath 03/22/2019   Sleep apnea    doesn't have machine yet   Syncope 04/08/2015   Tachycardia 05/21/2019   Type 2 diabetes mellitus with both eyes affected by moderate nonproliferative  retinopathy and macular edema, with long-term current use of insulin ([place]) 12/27/2012        Upper airway cough syndrome 03/22/2019    Patient Active Problem List   Diagnosis Date Noted   Hypersomnia, persistent 09/11/2020   OSA on CPAP 09/11/2020   Uncontrolled type 2 diabetes mellitus with hyperglycemia ([place]) 09/11/2020   PVFS (postviral fatigue syndrome) 09/11/2020   Other allergic rhinitis 03/18/2020   Reactive airway disease 03/18/2020   Adverse reaction to food, subsequent encounter 03/18/2020   Heartburn 03/18/2020   Vitamin D deficiency 09/04/2019   Stable proliferative diabetic retinopathy of both eyes associated with type 2 diabetes mellitus ([place]) 08/17/2019   Current mild episode of major depressive disorder ([place]) 08/08/2019   Sleep-related headache 07/05/2019   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) 07/05/2019   Cephalalgia 07/05/2019   Proliferative diabetic retinopathy of right eye ([place]) 06/20/2019   Proliferative diabetic retinopathy of left eye ([place]) 06/20/2019   Vitreous hemorrhage of left eye ([place]) 06/20/2019   DOE (dyspnea on exertion) 05/21/2019   Tachycardia 05/21/2019   History of COVID-19 03/22/2019   Upper airway cough syndrome 03/22/2019   Shortness of breath 03/22/2019  Carpal tunnel syndrome of right wrist 03/13/2019   Gastroesophageal reflux disease 03/23/2018   Lower extremity edema 08/29/2017   Hypertension associated with stage 2 chronic kidney disease due to type 2 diabetes mellitus ([place]) 12/12/2013   Morbid obesity ([place]) 05/25/2013   Type 2 diabetes mellitus with both eyes affected by moderate nonproliferative retinopathy and macular edema, with long-term current use of insulin ([place]) 12/27/2012   Depression 12/27/2012    Past Surgical History:  Procedure  Laterality Date   CERVICAL BIOPSY  2004   CESAREAN SECTION N/A 06/02/2012   Procedure: CESAREAN SECTION;  Surgeon: Mora Bellman, MD;  Location: [facility];  Service: Obstetrics;  Laterality: N/A;   RETINAL DETACHMENT REPAIR W/ SCLERAL BUCKLE LE Left 10/08/2019    OB History     Gravida  1   Para  1   Term  1   Preterm      AB  0   Living  1      SAB  0   IAB      Ectopic      Multiple      Live Births  1            Home Medications    Prior to Admission medications   Medication Sig Start Date End Date Taking? Authorizing Provider  albuterol (VENTOLIN HFA) 108 (90 Base) MCG/ACT inhaler Inhale 1-2 puffs into the lungs every 4 (four) hours as needed for shortness of breath.    [provider]  atorvastatin (LIPITOR) 80 MG tablet Take 80 mg by mouth daily. 09/10/20   [provider]  benzonatate (TESSALON) 100 MG capsule Take 2 capsules (200 mg total) by mouth every 8 (eight) hours. 08/11/20   Wieters, Hallie C, PA-C  Blood Pressure Monitoring (BLOOD PRESSURE MONITOR AUTOMAT) DEVI 1 kit by Does not apply route daily. Please call the office if you have any issues obtaining this monitor. 08/08/19   Wilber Oliphant, MD  carvedilol (COREG) 12.5 MG tablet Take 12.5 mg by mouth 2 (two) times daily with a meal.    [provider]  Cholecalciferol (VITAMIN D3) 50 MCG (2000 UT) CHEW Chew 2,000 Units by mouth daily. 02/06/20   Meccariello, Bernita Raisin, DO  Continuous Blood Gluc Sensor (DEXCOM G6 SENSOR) MISC Place one sensor every 10 days. 02/26/20   Meccariello, Bernita Raisin, DO  Continuous Blood Gluc Transmit (DEXCOM G6 TRANSMITTER) MISC Use every 90 days to transmit information from sensor. 02/26/20   Meccariello, Bernita Raisin, DO  Dulaglutide (TRULICITY) 1.5 PF/7.9KW SOPN Inject 1.5 mg into the skin once a week. 06/24/20   Hughes Better, RPH-CPP  fluticasone-salmeterol (ADVAIR HFA) [phone] MCG/ACT inhaler Inhale 2 puffs into the lungs 2 (two) times daily. 01/25/20    Martyn Ehrich, NP  HUMALOG KWIKPEN 200 UNIT/ML KwikPen INJECT 20 TO [place] TIMES DAILY 07/28/20   Ezequiel Essex, MD  injection device for insulin (INPEN 100-PINK-NOVO) DEVI Use as directed with novolog cartridges 04/10/20   Andrena Mews T, MD  Insulin Pen Needle (B-D ULTRAFINE III SHORT PEN) 31G X 8 MM MISC 1 Container by Does not apply route as needed. 08/08/19   Wilber Oliphant, MD  losartan (COZAAR) 100 MG tablet Take 100 mg by mouth daily.    [provider]  pantoprazole (PROTONIX) 40 MG tablet Take 1 tablet (40 mg total) by mouth daily. 11/16/19   Irene Shipper, MD  pregabalin (LYRICA) 75 MG capsule Take 1 capsule (75 mg total)  by mouth 2 (two) times daily. 09/26/20   Felipa Furnace, DPM  TRESIBA FLEXTOUCH 200 UNIT/ML FlexTouch Pen INJECT 50 UNITS SUBCUTANEOUSLY ONCE DAILY FOR 28 DAYS 07/28/20   Ezequiel Essex, MD  gabapentin (NEURONTIN) 100 MG capsule Take 1 capsule (100 mg total) by mouth at bedtime. 08/25/18 01/31/19  Kathrene Alu, MD    Family History Family History  Problem Relation Age of Onset   Diabetes Father    Stomach cancer Father    Cirrhosis Father    Cancer Mother    Diabetes Mother    Breast cancer Mother    Rectal cancer Neg Hx    Esophageal cancer Neg Hx    Colon cancer Neg Hx     Social History Social History   Tobacco Use   Smoking status: Never   Smokeless tobacco: Never  Vaping Use   Vaping Use: Never used  Substance Use Topics   Alcohol use: No   Drug use: No     Allergies   Lisinopril, Eggs or egg-derived products, and Contrast media [iodinated diagnostic agents]   Review of Systems Review of Systems  Constitutional:  Negative for chills and fever.  Eyes:  Negative for discharge and redness.  Gastrointestinal:  Negative for abdominal pain, nausea and vomiting.  Genitourinary:  Positive for flank pain, frequency, vaginal bleeding and vaginal discharge. Negative for dysuria.  Musculoskeletal:  Positive  for back pain.    Physical Exam Triage Vital Signs ED Triage Vitals  Enc Vitals Group     BP 11/14/20 1910 (!) 171/108     Pulse Rate 11/14/20 1910 (!) 101     Resp 11/14/20 1910 18     Temp 11/14/20 1910 97.9 F (36.6 C)     Temp Source 11/14/20 1910 Oral     SpO2 11/14/20 1910 97 %     Weight --      Height --      Head Circumference --      Peak Flow --      Pain Score 11/14/20 1912 10     Pain Loc --      Pain Edu? --      Excl. in [place]? --    No data found.  Updated Vital Signs BP (!) 171/108 (BP Location: Right Arm) Comment: Did not take BP meds last night  Pulse (!) 101   Temp 97.9 F (36.6 C) (Oral)   Resp 18   SpO2 97%      Physical Exam Vitals and nursing note reviewed.  Constitutional:      General: She is not in acute distress.    Appearance: Normal appearance. She is not ill-appearing.  HENT:     Head: Normocephalic and atraumatic.  Eyes:     Conjunctiva/sclera: Conjunctivae normal.  Cardiovascular:     Rate and Rhythm: Normal rate.  Pulmonary:     Effort: Pulmonary effort is normal.  Neurological:     Mental Status: She is alert.  Psychiatric:        Mood and Affect: Mood normal.        Behavior: Behavior normal.        Thought Content: Thought content normal.     UC Treatments / Results  Labs (all labs ordered are listed, but only abnormal results are displayed) Labs Reviewed  POCT URINALYSIS DIP (MANUAL ENTRY) - Abnormal; Notable for the following components:      Result Value   Glucose, UA =100 (*)    Blood,  UA small (*)    Protein Ur, POC >=300 (*)    All other components within normal limits    EKG   Radiology No results found.  Procedures Procedures (including critical care time)  Medications Ordered in UC Medications - No data to display  Initial Impression / Assessment and Plan / UC Course  I have reviewed the triage vital signs and the nursing notes.  Pertinent labs & imaging results that were available during my  care of the patient were reviewed by me and considered in my medical decision making (see chart for details).  UA without sign of UTI.  There is marked proteinuria however and given history of chronic kidney disease recommended further evaluation in the ED as I suspect she will need stat labs.  Patient is agreeable to same.  Final Clinical Impressions(s) / UC Diagnoses   Final diagnoses:  Flank pain  Proteinuria, unspecified type     Discharge Instructions      Please report to ED for further evaluation.    ED Prescriptions   None    PDMP not reviewed this encounter.   Francene Finders, PA-C 11/14/20 1935

## 2020-11-14 NOTE — ED Triage Notes (Addendum)
Pt was sent from Urgent Care for "kidney damage". Pt is present for sharp right kidney/flank pain x three days. Pt had UA done and was told she had a lot of protein in her urine. Pt is known Stage 3 CKD. Denies N/V/D.

## 2020-11-14 NOTE — ED Triage Notes (Signed)
3 day h/o right flank pain and urinary frequency. Pain w/ambulation. Denies hematuria, but c/o unusual high amount of "foam". Has been taking tylenol with some temporary relief. H/o CKD stage 3. Pt completed a video visit w/ her nephrologist who instructed her to come to UC for UA.

## 2020-11-17 ENCOUNTER — Telehealth: Payer: Self-pay

## 2020-11-17 DIAGNOSIS — F4325 Adjustment disorder with mixed disturbance of emotions and conduct: Secondary | ICD-10-CM | POA: Diagnosis not present

## 2020-11-17 NOTE — Telephone Encounter (Signed)
Transition Care Management Unsuccessful Follow-up Telephone Call  Date of discharge and from where:  11/14/2020-[facility]  Attempts:  1st Attempt  Reason for unsuccessful TCM follow-up call:  Left voice message

## 2020-11-18 DIAGNOSIS — E1121 Type 2 diabetes mellitus with diabetic nephropathy: Secondary | ICD-10-CM | POA: Diagnosis not present

## 2020-11-18 DIAGNOSIS — K219 Gastro-esophageal reflux disease without esophagitis: Secondary | ICD-10-CM | POA: Diagnosis not present

## 2020-11-18 DIAGNOSIS — E559 Vitamin D deficiency, unspecified: Secondary | ICD-10-CM | POA: Diagnosis not present

## 2020-11-18 DIAGNOSIS — E1169 Type 2 diabetes mellitus with other specified complication: Secondary | ICD-10-CM | POA: Diagnosis not present

## 2020-11-18 DIAGNOSIS — E785 Hyperlipidemia, unspecified: Secondary | ICD-10-CM | POA: Diagnosis not present

## 2020-11-18 DIAGNOSIS — J45909 Unspecified asthma, uncomplicated: Secondary | ICD-10-CM | POA: Diagnosis not present

## 2020-11-18 DIAGNOSIS — I10 Essential (primary) hypertension: Secondary | ICD-10-CM | POA: Diagnosis not present

## 2020-11-18 DIAGNOSIS — G4733 Obstructive sleep apnea (adult) (pediatric): Secondary | ICD-10-CM | POA: Diagnosis not present

## 2020-11-18 DIAGNOSIS — E11319 Type 2 diabetes mellitus with unspecified diabetic retinopathy without macular edema: Secondary | ICD-10-CM | POA: Diagnosis not present

## 2020-11-18 DIAGNOSIS — E1142 Type 2 diabetes mellitus with diabetic polyneuropathy: Secondary | ICD-10-CM | POA: Diagnosis not present

## 2020-11-18 NOTE — Telephone Encounter (Signed)
Transition Care Management Unsuccessful Follow-up Telephone Call  Date of discharge and from where:  11/14/2020 from [facility]  Attempts:  2nd Attempt  Reason for unsuccessful TCM follow-up call:  Left voice message

## 2020-11-19 ENCOUNTER — Ambulatory Visit (HOSPITAL_COMMUNITY)
Admission: RE | Admit: 2020-11-19 | Discharge: 2020-11-19 | Disposition: A | Discharge status: Home / Self Care | Payer: Medicaid Other | Source: Ambulatory Visit | Attending: Family Medicine | Admitting: Family Medicine

## 2020-11-19 ENCOUNTER — Ambulatory Visit: Payer: Medicaid Other | Admitting: Family Medicine

## 2020-11-19 ENCOUNTER — Other Ambulatory Visit: Payer: Self-pay

## 2020-11-19 VITALS — BP 181/98 | HR 99 | Wt 258.6 lb

## 2020-11-19 DIAGNOSIS — I152 Hypertension secondary to endocrine disorders: Secondary | ICD-10-CM | POA: Diagnosis not present

## 2020-11-19 DIAGNOSIS — E113313 Type 2 diabetes mellitus with moderate nonproliferative diabetic retinopathy with macular edema, bilateral: Secondary | ICD-10-CM | POA: Diagnosis not present

## 2020-11-19 DIAGNOSIS — R1011 Right upper quadrant pain: Secondary | ICD-10-CM | POA: Diagnosis not present

## 2020-11-19 DIAGNOSIS — M545 Low back pain, unspecified: Secondary | ICD-10-CM

## 2020-11-19 DIAGNOSIS — R29898 Other symptoms and signs involving the musculoskeletal system: Secondary | ICD-10-CM

## 2020-11-19 DIAGNOSIS — E1122 Type 2 diabetes mellitus with diabetic chronic kidney disease: Secondary | ICD-10-CM | POA: Diagnosis not present

## 2020-11-19 DIAGNOSIS — E1159 Type 2 diabetes mellitus with other circulatory complications: Secondary | ICD-10-CM | POA: Diagnosis not present

## 2020-11-19 DIAGNOSIS — Z794 Long term (current) use of insulin: Secondary | ICD-10-CM

## 2020-11-19 DIAGNOSIS — R300 Dysuria: Secondary | ICD-10-CM

## 2020-11-19 DIAGNOSIS — E1165 Type 2 diabetes mellitus with hyperglycemia: Secondary | ICD-10-CM | POA: Diagnosis not present

## 2020-11-19 DIAGNOSIS — N1832 Chronic kidney disease, stage 3b: Secondary | ICD-10-CM

## 2020-11-19 DIAGNOSIS — R109 Unspecified abdominal pain: Secondary | ICD-10-CM | POA: Diagnosis not present

## 2020-11-19 DIAGNOSIS — I129 Hypertensive chronic kidney disease with stage 1 through stage 4 chronic kidney disease, or unspecified chronic kidney disease: Secondary | ICD-10-CM | POA: Diagnosis not present

## 2020-11-19 DIAGNOSIS — N182 Chronic kidney disease, stage 2 (mild): Secondary | ICD-10-CM

## 2020-11-19 DIAGNOSIS — R600 Localized edema: Secondary | ICD-10-CM | POA: Diagnosis not present

## 2020-11-19 LAB — POCT URINALYSIS DIP (MANUAL ENTRY)
Bilirubin, UA: NEGATIVE
Glucose, UA: 100 mg/dL — AB
Ketones, POC UA: NEGATIVE mg/dL
Leukocytes, UA: NEGATIVE
Nitrite, UA: NEGATIVE
Protein Ur, POC: >=300 mg/dL — AB
Spec Grav, UA: 1.02 (ref 1.010–1.025)
Urobilinogen, UA: 0.2 E.U./dL
pH, UA: 6.5 (ref 5.0–8.0)

## 2020-11-19 MED ORDER — BACLOFEN 10 MG PO TABS
10.0000 mg | ORAL_TABLET | Freq: Three times a day (TID) | ORAL | 0 refills | Status: DC
Start: 2020-11-19 — End: 2022-05-15

## 2020-11-19 NOTE — Telephone Encounter (Signed)
Transition Care Management Unsuccessful Follow-up Telephone Call  Date of discharge and from where:  11/14/2020 from [facility]  Attempts:  3rd Attempt  Reason for unsuccessful TCM follow-up call:  Unable to reach patient

## 2020-11-19 NOTE — Patient Instructions (Addendum)
It was a pleasure to see you today!  For your flank pain, we are scheduling a CT for you to check for a kidney stone. We will get this scheduled for you and let you know the time. For your back pain, use baclofen 10 g up to three times per day. I recommend using this when you don't plan on driving anywhere as it may make you sleepy. You will need physical therapy when your pain is improved for your pelvic weakness. I recommend starting this in about 2-3 weeks. You will receive a phone call to schedule this. Follow up if no improvement in pain    Be Well,  Dr. Leary Roca

## 2020-11-19 NOTE — Progress Notes (Signed)
SUBJECTIVE:   CHIEF COMPLAINT / HPI: flank pain  Flank/back pain: patient had flank pain and "foamy" urine on 11/3 and went to UC/ED on 11/4. UA then neg for nitrites/leuks, but positive for small hgb and proteinuria. Patient has DM2 and CKD 3b. S cr checked yesterday shows marked increase since July, from 1.92 to 2.15 on 11/18/20. Patient states pain is in her right mid back, goes down to right buttock and wraps around right side to RUQ and RLQ. She denies fevers, chills, trouble passing urine. She also has saddle anesthesia or incontinence. She reports pain is sharp and very painful in sitting and walking positions.  HTN: BP is not at goal today 181/98. Patient is on losartan and coreg, she reports that she has not taken her medication in 2 days as she forgot to do so. She denies vision changes, headaches. Since patient is having acute pain, elevation could also in part be effected by pain.  DM2: Last A1c checked at bariatric office yesterday, 11/8, was 11%. Patient recently started GLP1, her DM2 is managed by an endocrinologist. She reports that she has upcoming appt   AKI on CKD: patient had elevation in s cr from 1.92 in July 2.15 to 2.28 on 11/18/20. Recommend she drink fluids, she has follow up appt with nephrology on Friday, 11/21/20.   PERTINENT  PMH / PSH: DM2, CKD 3b  OBJECTIVE:   BP (!) 181/98   Pulse 99   Wt 258 lb 9.6 oz (117.3 kg)   SpO2 100%   BMI 47.30 kg/m   Nursing note and vitals reviewed GEN: age-appropriate, AAW, resting comfortably in chair, NAD, class III obesity Cardiac: Regular rate and rhythm. Normal S1/S2. No murmurs, rubs, or gallops appreciated. 2+ radial pulses. Lungs: Clear bilaterally to ascultation. No increased WOB, no accessory muscle usage. No w/r/r. Abdomen: Normoactive bowel sounds. No tenderness to deep or light palpation. No rebound or guarding.   Lumbar spine: - Inspection: no gross deformity or asymmetry, swelling or ecchymosis. No skin  changes - Palpation: +TTP over right flank and mid thoracic paraspinal muscles down to buttock, no central or left sided TTP - ROM: full active ROM of the lumbar spine in flexion and extension without pain - Strength: 5/5 strength of lower extremity in L4-S1 nerve root distributions b/l, 3/5 thigh abduction b/l - Neuro: sensation intact in the L4-S1 nerve root distribution b/l, 2+ L4 and S1 reflexes - Straight Leg Raise test: NEG Neuro: Aox3 Gait: trendelenburg gait present Ext: no edema Psych: Pleasant and appropriate   ASSESSMENT/PLAN:   Flank pain Patient has had 6 days of acute right flank pain. Pertinent negatives include no signs of UTI based on multiple UAs. However, she has had hematuria and an worsening in renal function (s cr from 1.98 to 2.15). Based on these findings, she needed imaging to rule out renal obstruction. Stat CT with renal protocol (non contrast) performed and found no renal calculi or other renal/ureteral obstruction; only acute finding was constipation.   On physical exam, patient was sitting very comfortably in chair, but had trendelenburg gait with small steps, and was exquisitely tender to touch from mid right thoracic back to right buttock. Suspect that she has a flare of musculoligamentous back pain worsened by deconditioning and weakness of pelvic girdle muscles, as evidenced by + trendelenburg, weak thigh abductor muscles. Neuro exam WNL. Due to renal function, pain control options limited. Will try baclofen 10 mg TID prn. Recommend follow up if no improvement  in pain. Ambulatory referral to PT for pelvic girdle weakness placed.  Pelvic girdle weakness See above  Uncontrolled type 2 diabetes mellitus with hyperglycemia (HCC) Recent A1c uncontrolled 11.3%, patient reports recent start of GLP-1i. Recommend follow up asap with endocrinology who manages problem.  Hypertension associated with stage 2 chronic kidney disease due to type 2 diabetes mellitus  (HCC) Uncontrolled 180/90s today, asymptomatic. Patient is noncompliant with medicine, recommend she take losartan and coreg as soon as she gets home and rechecks her BP afterward. Patient has upcoming appt with Nephrology on 11/21/20.  Stage 3b chronic kidney disease (CKD) (HCC) Significant increase in s cr from 1.98 to 2.15 in 4 months, now eGFR is 30. Recommend patient stay hydrated and follow up with nephrology on 11/21/20.     Gladys Damme, MD [place]

## 2020-11-20 ENCOUNTER — Telehealth: Payer: Self-pay | Admitting: *Deleted

## 2020-11-20 DIAGNOSIS — R29898 Other symptoms and signs involving the musculoskeletal system: Secondary | ICD-10-CM | POA: Insufficient documentation

## 2020-11-20 DIAGNOSIS — I152 Hypertension secondary to endocrine disorders: Secondary | ICD-10-CM | POA: Insufficient documentation

## 2020-11-20 DIAGNOSIS — N1832 Chronic kidney disease, stage 3b: Secondary | ICD-10-CM | POA: Insufficient documentation

## 2020-11-20 DIAGNOSIS — R109 Unspecified abdominal pain: Secondary | ICD-10-CM | POA: Insufficient documentation

## 2020-11-20 DIAGNOSIS — E1159 Type 2 diabetes mellitus with other circulatory complications: Secondary | ICD-10-CM | POA: Insufficient documentation

## 2020-11-20 DIAGNOSIS — I10 Essential (primary) hypertension: Secondary | ICD-10-CM | POA: Insufficient documentation

## 2020-11-20 NOTE — Assessment & Plan Note (Signed)
Recent A1c uncontrolled 11.3%, patient reports recent start of GLP-1i. Recommend follow up asap with endocrinology who manages problem.

## 2020-11-20 NOTE — Telephone Encounter (Signed)
CT was negative for renal calculi. Only finding was constipation. Results sent to patient via MyChart.  Shirlean Mylar, MD [facility], PGY-3

## 2020-11-20 NOTE — Assessment & Plan Note (Signed)
Uncontrolled 180/90s today, asymptomatic. Patient is noncompliant with medicine, recommend she take losartan and coreg as soon as she gets home and rechecks her BP afterward. Patient has upcoming appt with Nephrology on 11/21/20.

## 2020-11-20 NOTE — Telephone Encounter (Signed)
Pt calling in wanting the results of her STAT CT scan from yesterday. Please advise. Ettore Trebilcock Bruna Potter, CMA

## 2020-11-20 NOTE — Assessment & Plan Note (Signed)
Significant increase in s cr from 1.98 to 2.15 in 4 months, now eGFR is 30. Recommend patient stay hydrated and follow up with nephrology on 11/21/20.

## 2020-11-20 NOTE — Assessment & Plan Note (Signed)
Patient has had 6 days of acute right flank pain. Pertinent negatives include no signs of UTI based on multiple UAs. However, she has had hematuria and an worsening in renal function (s cr from 1.98 to 2.15). Based on these findings, she needed imaging to rule out renal obstruction. Stat CT with renal protocol (non contrast) performed and found no renal calculi or other renal/ureteral obstruction; only acute finding was constipation.   On physical exam, patient was sitting very comfortably in chair, but had trendelenburg gait with small steps, and was exquisitely tender to touch from mid right thoracic back to right buttock. Suspect that she has a flare of musculoligamentous back pain worsened by deconditioning and weakness of pelvic girdle muscles, as evidenced by + trendelenburg, weak thigh abductor muscles. Neuro exam WNL. Due to renal function, pain control options limited. Will try baclofen 10 mg TID prn. Recommend follow up if no improvement in pain. Ambulatory referral to PT for pelvic girdle weakness placed.

## 2020-11-20 NOTE — Assessment & Plan Note (Signed)
See above

## 2020-11-26 DIAGNOSIS — Z961 Presence of intraocular lens: Secondary | ICD-10-CM | POA: Diagnosis not present

## 2020-11-26 DIAGNOSIS — Z794 Long term (current) use of insulin: Secondary | ICD-10-CM | POA: Diagnosis not present

## 2020-11-26 DIAGNOSIS — H269 Unspecified cataract: Secondary | ICD-10-CM | POA: Diagnosis not present

## 2020-11-26 DIAGNOSIS — Z79899 Other long term (current) drug therapy: Secondary | ICD-10-CM | POA: Diagnosis not present

## 2020-11-26 DIAGNOSIS — E113551 Type 2 diabetes mellitus with stable proliferative diabetic retinopathy, right eye: Secondary | ICD-10-CM | POA: Diagnosis not present

## 2020-11-26 DIAGNOSIS — H0288A Meibomian gland dysfunction right eye, upper and lower eyelids: Secondary | ICD-10-CM | POA: Diagnosis not present

## 2020-11-26 DIAGNOSIS — E113522 Type 2 diabetes mellitus with proliferative diabetic retinopathy with traction retinal detachment involving the macula, left eye: Secondary | ICD-10-CM | POA: Diagnosis not present

## 2020-11-26 DIAGNOSIS — H0288B Meibomian gland dysfunction left eye, upper and lower eyelids: Secondary | ICD-10-CM | POA: Diagnosis not present

## 2020-12-08 DIAGNOSIS — I129 Hypertensive chronic kidney disease with stage 1 through stage 4 chronic kidney disease, or unspecified chronic kidney disease: Secondary | ICD-10-CM | POA: Diagnosis not present

## 2020-12-08 DIAGNOSIS — E669 Obesity, unspecified: Secondary | ICD-10-CM | POA: Diagnosis not present

## 2020-12-08 DIAGNOSIS — N1831 Chronic kidney disease, stage 3a: Secondary | ICD-10-CM | POA: Diagnosis not present

## 2020-12-08 DIAGNOSIS — D649 Anemia, unspecified: Secondary | ICD-10-CM | POA: Diagnosis not present

## 2020-12-08 DIAGNOSIS — E1122 Type 2 diabetes mellitus with diabetic chronic kidney disease: Secondary | ICD-10-CM | POA: Diagnosis not present

## 2020-12-09 DIAGNOSIS — G4733 Obstructive sleep apnea (adult) (pediatric): Secondary | ICD-10-CM | POA: Diagnosis not present

## 2020-12-15 ENCOUNTER — Other Ambulatory Visit: Payer: Self-pay | Admitting: Internal Medicine

## 2020-12-17 ENCOUNTER — Ambulatory Visit: Payer: Medicaid Other | Admitting: Neurology

## 2020-12-19 DIAGNOSIS — I10 Essential (primary) hypertension: Secondary | ICD-10-CM | POA: Diagnosis not present

## 2020-12-19 DIAGNOSIS — E1165 Type 2 diabetes mellitus with hyperglycemia: Secondary | ICD-10-CM | POA: Diagnosis not present

## 2020-12-19 DIAGNOSIS — N1832 Chronic kidney disease, stage 3b: Secondary | ICD-10-CM | POA: Diagnosis not present

## 2020-12-19 DIAGNOSIS — Z6841 Body Mass Index (BMI) 40.0 and over, adult: Secondary | ICD-10-CM | POA: Diagnosis not present

## 2020-12-26 ENCOUNTER — Other Ambulatory Visit: Payer: Self-pay | Admitting: Podiatry

## 2020-12-28 NOTE — Telephone Encounter (Signed)
Please advise 

## 2021-01-08 DIAGNOSIS — G4733 Obstructive sleep apnea (adult) (pediatric): Secondary | ICD-10-CM | POA: Diagnosis not present

## 2021-01-18 ENCOUNTER — Other Ambulatory Visit: Payer: Self-pay | Admitting: Podiatry

## 2021-01-19 NOTE — Telephone Encounter (Signed)
Please advise 

## 2021-01-21 DIAGNOSIS — Z794 Long term (current) use of insulin: Secondary | ICD-10-CM | POA: Diagnosis not present

## 2021-01-21 DIAGNOSIS — G4733 Obstructive sleep apnea (adult) (pediatric): Secondary | ICD-10-CM | POA: Diagnosis not present

## 2021-01-21 DIAGNOSIS — Z13228 Encounter for screening for other metabolic disorders: Secondary | ICD-10-CM | POA: Diagnosis not present

## 2021-01-21 DIAGNOSIS — E785 Hyperlipidemia, unspecified: Secondary | ICD-10-CM | POA: Diagnosis not present

## 2021-01-21 DIAGNOSIS — E1169 Type 2 diabetes mellitus with other specified complication: Secondary | ICD-10-CM | POA: Diagnosis not present

## 2021-01-21 DIAGNOSIS — E1122 Type 2 diabetes mellitus with diabetic chronic kidney disease: Secondary | ICD-10-CM | POA: Diagnosis not present

## 2021-01-21 DIAGNOSIS — N1832 Chronic kidney disease, stage 3b: Secondary | ICD-10-CM | POA: Diagnosis not present

## 2021-01-21 DIAGNOSIS — Z6841 Body Mass Index (BMI) 40.0 and over, adult: Secondary | ICD-10-CM | POA: Diagnosis not present

## 2021-01-21 DIAGNOSIS — F330 Major depressive disorder, recurrent, mild: Secondary | ICD-10-CM | POA: Diagnosis not present

## 2021-02-01 ENCOUNTER — Other Ambulatory Visit: Payer: Self-pay | Admitting: Podiatry

## 2021-02-02 ENCOUNTER — Telehealth: Payer: Self-pay | Admitting: Podiatry

## 2021-02-02 NOTE — Telephone Encounter (Signed)
Please advise 

## 2021-02-02 NOTE — Telephone Encounter (Signed)
Patient called and stated the pharmacy has been trying to send over medication refills for Lyrica.   Pharmacy stated it was not received on 01/20/2021.  Please advise

## 2021-02-03 ENCOUNTER — Other Ambulatory Visit: Payer: Self-pay | Admitting: Podiatry

## 2021-02-03 MED ORDER — PREGABALIN 75 MG PO CAPS: 75.0000 mg | ORAL_CAPSULE | Freq: Two times a day (BID) | ORAL | 3 refills | Status: DC

## 2021-03-16 DIAGNOSIS — F4325 Adjustment disorder with mixed disturbance of emotions and conduct: Secondary | ICD-10-CM | POA: Diagnosis not present

## 2021-03-18 DIAGNOSIS — E559 Vitamin D deficiency, unspecified: Secondary | ICD-10-CM | POA: Diagnosis not present

## 2021-03-18 DIAGNOSIS — E1122 Type 2 diabetes mellitus with diabetic chronic kidney disease: Secondary | ICD-10-CM | POA: Diagnosis not present

## 2021-03-18 DIAGNOSIS — N1831 Chronic kidney disease, stage 3a: Secondary | ICD-10-CM | POA: Diagnosis not present

## 2021-03-18 DIAGNOSIS — D649 Anemia, unspecified: Secondary | ICD-10-CM | POA: Diagnosis not present

## 2021-03-18 DIAGNOSIS — I129 Hypertensive chronic kidney disease with stage 1 through stage 4 chronic kidney disease, or unspecified chronic kidney disease: Secondary | ICD-10-CM | POA: Diagnosis not present

## 2021-03-23 ENCOUNTER — Other Ambulatory Visit (HOSPITAL_COMMUNITY): Payer: Self-pay

## 2021-03-23 ENCOUNTER — Ambulatory Visit: Payer: Medicaid Other | Admitting: Neurology

## 2021-03-24 ENCOUNTER — Other Ambulatory Visit (HOSPITAL_COMMUNITY): Payer: Self-pay

## 2021-03-28 ENCOUNTER — Other Ambulatory Visit: Payer: Self-pay | Admitting: Family Medicine

## 2021-03-28 DIAGNOSIS — E113313 Type 2 diabetes mellitus with moderate nonproliferative diabetic retinopathy with macular edema, bilateral: Secondary | ICD-10-CM

## 2021-04-02 ENCOUNTER — Telehealth: Payer: Self-pay | Admitting: *Deleted

## 2021-04-02 NOTE — Patient Outreach (Signed)
Care Coordination ? ?04/02/2021 ? ?Karina [name] ?08-23-1985 ?[id number] ? ? ?Medicaid Managed Care  ? ?Unsuccessful Outreach Note ? ?04/02/2021 ?Name: Karina [name] MRN: [id number] DOB: [birth year] ? ?Referred by: Fayette Pho, MD ?Reason for referral : No chief complaint on file. ? ? ?Third unsuccessful telephone outreach was attempted today. The patient was referred to the case management team for assistance with care management and care coordination. The patient's primary care provider has been notified of our unsuccessful attempts to make or maintain contact with the patient. The care management team is pleased to engage with this patient at any time in the future should he/she be interested in assistance from the care management team.  ? ?Follow Up Plan: We have been unable to make contact with the patient for follow up. The care management team is available to follow up with the patient after provider conversation with the patient regarding recommendation for care management engagement and subsequent re-referral to the care management team.  ? ?Estanislado Emms RN, BSN ?[place]  [facility] ?RN Care Coordinator ? ? ?

## 2021-04-15 DIAGNOSIS — Z961 Presence of intraocular lens: Secondary | ICD-10-CM | POA: Diagnosis not present

## 2021-04-15 DIAGNOSIS — E113522 Type 2 diabetes mellitus with proliferative diabetic retinopathy with traction retinal detachment involving the macula, left eye: Secondary | ICD-10-CM | POA: Diagnosis not present

## 2021-04-15 DIAGNOSIS — Z794 Long term (current) use of insulin: Secondary | ICD-10-CM | POA: Diagnosis not present

## 2021-04-15 DIAGNOSIS — E113551 Type 2 diabetes mellitus with stable proliferative diabetic retinopathy, right eye: Secondary | ICD-10-CM | POA: Diagnosis not present

## 2021-04-15 DIAGNOSIS — H0288A Meibomian gland dysfunction right eye, upper and lower eyelids: Secondary | ICD-10-CM | POA: Diagnosis not present

## 2021-04-15 DIAGNOSIS — H269 Unspecified cataract: Secondary | ICD-10-CM | POA: Diagnosis not present

## 2021-04-15 DIAGNOSIS — Z7985 Long-term (current) use of injectable non-insulin antidiabetic drugs: Secondary | ICD-10-CM | POA: Diagnosis not present

## 2021-04-15 DIAGNOSIS — H0288B Meibomian gland dysfunction left eye, upper and lower eyelids: Secondary | ICD-10-CM | POA: Diagnosis not present

## 2021-05-05 ENCOUNTER — Other Ambulatory Visit (HOSPITAL_COMMUNITY): Payer: Self-pay

## 2021-05-20 DIAGNOSIS — F4323 Adjustment disorder with mixed anxiety and depressed mood: Secondary | ICD-10-CM | POA: Diagnosis not present

## 2021-05-25 DIAGNOSIS — F4323 Adjustment disorder with mixed anxiety and depressed mood: Secondary | ICD-10-CM | POA: Diagnosis not present

## 2021-05-27 DIAGNOSIS — E785 Hyperlipidemia, unspecified: Secondary | ICD-10-CM | POA: Diagnosis not present

## 2021-05-27 DIAGNOSIS — Z794 Long term (current) use of insulin: Secondary | ICD-10-CM | POA: Diagnosis not present

## 2021-05-27 DIAGNOSIS — E1122 Type 2 diabetes mellitus with diabetic chronic kidney disease: Secondary | ICD-10-CM | POA: Diagnosis not present

## 2021-05-27 DIAGNOSIS — E11319 Type 2 diabetes mellitus with unspecified diabetic retinopathy without macular edema: Secondary | ICD-10-CM | POA: Diagnosis not present

## 2021-05-27 DIAGNOSIS — E1140 Type 2 diabetes mellitus with diabetic neuropathy, unspecified: Secondary | ICD-10-CM | POA: Diagnosis not present

## 2021-05-27 DIAGNOSIS — Z5941 Food insecurity: Secondary | ICD-10-CM | POA: Diagnosis not present

## 2021-05-27 DIAGNOSIS — E1169 Type 2 diabetes mellitus with other specified complication: Secondary | ICD-10-CM | POA: Diagnosis not present

## 2021-05-27 DIAGNOSIS — N1832 Chronic kidney disease, stage 3b: Secondary | ICD-10-CM | POA: Diagnosis not present

## 2021-06-03 DIAGNOSIS — F4323 Adjustment disorder with mixed anxiety and depressed mood: Secondary | ICD-10-CM | POA: Diagnosis not present

## 2021-06-16 ENCOUNTER — Encounter: Payer: Self-pay | Admitting: *Deleted

## 2021-06-16 DIAGNOSIS — F4323 Adjustment disorder with mixed anxiety and depressed mood: Secondary | ICD-10-CM | POA: Diagnosis not present

## 2021-06-17 ENCOUNTER — Other Ambulatory Visit: Payer: Self-pay | Admitting: Family Medicine

## 2021-06-17 ENCOUNTER — Other Ambulatory Visit: Payer: Self-pay | Admitting: Internal Medicine

## 2021-06-17 DIAGNOSIS — E113313 Type 2 diabetes mellitus with moderate nonproliferative diabetic retinopathy with macular edema, bilateral: Secondary | ICD-10-CM

## 2021-07-01 DIAGNOSIS — F4323 Adjustment disorder with mixed anxiety and depressed mood: Secondary | ICD-10-CM | POA: Diagnosis not present

## 2021-07-06 DIAGNOSIS — I10 Essential (primary) hypertension: Secondary | ICD-10-CM | POA: Diagnosis not present

## 2021-07-06 DIAGNOSIS — E11319 Type 2 diabetes mellitus with unspecified diabetic retinopathy without macular edema: Secondary | ICD-10-CM | POA: Diagnosis not present

## 2021-07-06 DIAGNOSIS — E1140 Type 2 diabetes mellitus with diabetic neuropathy, unspecified: Secondary | ICD-10-CM | POA: Diagnosis not present

## 2021-07-06 DIAGNOSIS — Z794 Long term (current) use of insulin: Secondary | ICD-10-CM | POA: Diagnosis not present

## 2021-07-06 DIAGNOSIS — E1122 Type 2 diabetes mellitus with diabetic chronic kidney disease: Secondary | ICD-10-CM | POA: Diagnosis not present

## 2021-07-06 DIAGNOSIS — F4323 Adjustment disorder with mixed anxiety and depressed mood: Secondary | ICD-10-CM | POA: Diagnosis not present

## 2021-07-06 DIAGNOSIS — L299 Pruritus, unspecified: Secondary | ICD-10-CM | POA: Diagnosis not present

## 2021-07-06 DIAGNOSIS — N1832 Chronic kidney disease, stage 3b: Secondary | ICD-10-CM | POA: Diagnosis not present

## 2021-07-06 DIAGNOSIS — E785 Hyperlipidemia, unspecified: Secondary | ICD-10-CM | POA: Diagnosis not present

## 2021-07-06 DIAGNOSIS — E1169 Type 2 diabetes mellitus with other specified complication: Secondary | ICD-10-CM | POA: Diagnosis not present

## 2021-07-13 DIAGNOSIS — F4323 Adjustment disorder with mixed anxiety and depressed mood: Secondary | ICD-10-CM | POA: Diagnosis not present

## 2021-07-20 DIAGNOSIS — F4323 Adjustment disorder with mixed anxiety and depressed mood: Secondary | ICD-10-CM | POA: Diagnosis not present

## 2021-07-22 DIAGNOSIS — I10 Essential (primary) hypertension: Secondary | ICD-10-CM | POA: Diagnosis not present

## 2021-07-27 ENCOUNTER — Telehealth: Payer: Self-pay | Admitting: Internal Medicine

## 2021-07-27 NOTE — Telephone Encounter (Signed)
Patient called to inquire if Dr. Marina Goodell does an endoscopic sleeve procedure.  You can respond to her through My Chart.  Thank you.

## 2021-07-27 NOTE — Telephone Encounter (Signed)
Left message for pt to call back  °

## 2021-07-28 NOTE — Telephone Encounter (Signed)
Discussed with pt that Dr. Marina Goodell does not do the wt loss sleeve, suggested she try CCS.

## 2021-07-29 DIAGNOSIS — I129 Hypertensive chronic kidney disease with stage 1 through stage 4 chronic kidney disease, or unspecified chronic kidney disease: Secondary | ICD-10-CM | POA: Diagnosis not present

## 2021-07-29 DIAGNOSIS — N1831 Chronic kidney disease, stage 3a: Secondary | ICD-10-CM | POA: Diagnosis not present

## 2021-07-29 DIAGNOSIS — E1122 Type 2 diabetes mellitus with diabetic chronic kidney disease: Secondary | ICD-10-CM | POA: Diagnosis not present

## 2021-07-29 DIAGNOSIS — N2581 Secondary hyperparathyroidism of renal origin: Secondary | ICD-10-CM | POA: Diagnosis not present

## 2021-07-29 DIAGNOSIS — D649 Anemia, unspecified: Secondary | ICD-10-CM | POA: Diagnosis not present

## 2021-07-29 DIAGNOSIS — R809 Proteinuria, unspecified: Secondary | ICD-10-CM | POA: Diagnosis not present

## 2021-07-30 DIAGNOSIS — F4323 Adjustment disorder with mixed anxiety and depressed mood: Secondary | ICD-10-CM | POA: Diagnosis not present

## 2021-08-03 DIAGNOSIS — F4323 Adjustment disorder with mixed anxiety and depressed mood: Secondary | ICD-10-CM | POA: Diagnosis not present

## 2021-08-05 DIAGNOSIS — N1831 Chronic kidney disease, stage 3a: Secondary | ICD-10-CM | POA: Diagnosis not present

## 2021-08-05 DIAGNOSIS — I10 Essential (primary) hypertension: Secondary | ICD-10-CM | POA: Diagnosis not present

## 2021-08-13 DIAGNOSIS — G4733 Obstructive sleep apnea (adult) (pediatric): Secondary | ICD-10-CM | POA: Diagnosis not present

## 2021-08-26 DIAGNOSIS — E1140 Type 2 diabetes mellitus with diabetic neuropathy, unspecified: Secondary | ICD-10-CM | POA: Diagnosis not present

## 2021-08-26 DIAGNOSIS — N1832 Chronic kidney disease, stage 3b: Secondary | ICD-10-CM | POA: Diagnosis not present

## 2021-08-26 DIAGNOSIS — Z794 Long term (current) use of insulin: Secondary | ICD-10-CM | POA: Diagnosis not present

## 2021-08-26 DIAGNOSIS — I129 Hypertensive chronic kidney disease with stage 1 through stage 4 chronic kidney disease, or unspecified chronic kidney disease: Secondary | ICD-10-CM | POA: Diagnosis not present

## 2021-08-26 DIAGNOSIS — E1169 Type 2 diabetes mellitus with other specified complication: Secondary | ICD-10-CM | POA: Diagnosis not present

## 2021-08-26 DIAGNOSIS — E785 Hyperlipidemia, unspecified: Secondary | ICD-10-CM | POA: Diagnosis not present

## 2021-08-26 DIAGNOSIS — E11319 Type 2 diabetes mellitus with unspecified diabetic retinopathy without macular edema: Secondary | ICD-10-CM | POA: Diagnosis not present

## 2021-08-26 DIAGNOSIS — E1122 Type 2 diabetes mellitus with diabetic chronic kidney disease: Secondary | ICD-10-CM | POA: Diagnosis not present

## 2021-09-09 DIAGNOSIS — F4323 Adjustment disorder with mixed anxiety and depressed mood: Secondary | ICD-10-CM | POA: Diagnosis not present

## 2021-09-30 ENCOUNTER — Other Ambulatory Visit: Payer: Self-pay | Admitting: Podiatry

## 2021-10-14 ENCOUNTER — Other Ambulatory Visit: Payer: Self-pay | Admitting: Podiatry

## 2021-10-14 ENCOUNTER — Other Ambulatory Visit (HOSPITAL_COMMUNITY): Payer: Self-pay

## 2021-10-21 ENCOUNTER — Other Ambulatory Visit (HOSPITAL_COMMUNITY): Payer: Self-pay

## 2021-10-21 DIAGNOSIS — E1122 Type 2 diabetes mellitus with diabetic chronic kidney disease: Secondary | ICD-10-CM | POA: Diagnosis not present

## 2021-10-21 DIAGNOSIS — E669 Obesity, unspecified: Secondary | ICD-10-CM | POA: Diagnosis not present

## 2021-10-21 DIAGNOSIS — N184 Chronic kidney disease, stage 4 (severe): Secondary | ICD-10-CM | POA: Diagnosis not present

## 2021-10-21 DIAGNOSIS — R809 Proteinuria, unspecified: Secondary | ICD-10-CM | POA: Diagnosis not present

## 2021-10-21 DIAGNOSIS — N2581 Secondary hyperparathyroidism of renal origin: Secondary | ICD-10-CM | POA: Diagnosis not present

## 2021-10-21 DIAGNOSIS — D649 Anemia, unspecified: Secondary | ICD-10-CM | POA: Diagnosis not present

## 2021-11-19 ENCOUNTER — Ambulatory Visit: Payer: Medicaid Other | Admitting: Podiatry

## 2021-11-19 DIAGNOSIS — B351 Tinea unguium: Secondary | ICD-10-CM

## 2021-11-19 DIAGNOSIS — M792 Neuralgia and neuritis, unspecified: Secondary | ICD-10-CM | POA: Diagnosis not present

## 2021-11-19 LAB — HEPATIC FUNCTION PANEL
AG Ratio: 1 (calc) (ref 1.0–2.5)
ALT: 11 U/L (ref 6–29)
AST: 13 U/L (ref 10–30)
Albumin: 3.3 g/dL — ABNORMAL LOW (ref 3.6–5.1)
Alkaline phosphatase (APISO): 103 U/L (ref 31–125)
Bilirubin, Direct: 0 mg/dL (ref 0.0–0.2)
Globulin: 3.3 g/dL (calc) (ref 1.9–3.7)
Indirect Bilirubin: 0.3 mg/dL (calc) (ref 0.2–1.2)
Total Bilirubin: 0.3 mg/dL (ref 0.2–1.2)
Total Protein: 6.6 g/dL (ref 6.1–8.1)

## 2021-11-19 MED ORDER — PREGABALIN 75 MG PO CAPS: 75.0000 mg | ORAL_CAPSULE | Freq: Two times a day (BID) | ORAL | 3 refills | Status: DC

## 2021-11-19 NOTE — Progress Notes (Signed)
Subjective:  Patient ID: Karina [name], female    DOB: May 10, 1985,  MRN: [id number]  Chief Complaint  Patient presents with   Diabetes    36 y.o. female presents with the above complaint.  Patient presents with complaint bilateral hallux thickened elongated dystrophic mycotic nail.  Patient states she has tried everything she would like to discuss treatment options for treating nail fungus.  She has tried some topical medication which has not helped.  She is a diabetic with last A1c of 11%.  She states she does not have any liver issues.   Review of Systems: Negative except as noted in the HPI. Denies N/V/F/Ch.  Past Medical History:  Diagnosis Date   Acute pain of right shoulder 03/13/2019   Allergy    Angio-edema    Asthma    Back pain 12/12/2017   Carpal tunnel syndrome of right wrist 03/13/2019   COVID-19 01/24/2019   Depression 12/27/2012   Diabetes mellitus    Type 2, insulin resistant   Diabetic neuropathy, type II diabetes mellitus ([place]) 05/23/2013   DOE (dyspnea on exertion) 05/21/2019   Onset with covid 19 infection symptoms started 01/16/19  -  05/23/2019   Walked RA  3 laps @ approx 228f each @ moderat pace  stopped due to end of study,  no limiting sob/ sats 100% at end despite reported sob at rest    Essential hypertension, benign 12/12/2013   GERD (gastroesophageal reflux disease)    History of COVID-19 03/22/2019   Lower extremity edema 08/29/2017   Moderate nonproliferative diabetic retinopathy ([facility] 05/24/2013   Both OS and OD.  Diagnosed by Dr. SLoyal Buba MD, PhD 05/23/13. Also mild macular edema. Follows with Dr. 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, with  long-term current use of insulin ([facility] 12/27/2012        Upper airway cough syndrome 03/22/2019    Current Outpatient Medications:    terbinafine (LAMISIL) 250 MG tablet, Take 1 tablet (250 mg total) by mouth daily., Disp: 90 tablet, Rfl: 0   albuterol (VENTOLIN HFA) 108 (90 Base) MCG/ACT inhaler, Inhale 1-2 puffs into the lungs every 4 (four) hours as needed for shortness of breath., Disp: , Rfl:    atorvastatin (LIPITOR) 80 MG tablet, Take 80 mg by mouth daily., Disp: , Rfl:    baclofen (LIORESAL) 10 MG tablet, Take 1 tablet (10 mg total) by mouth 3 (three) times daily., Disp: 30 each, Rfl: 0   benzonatate (TESSALON) 100 MG capsule, Take 2 capsules (200 mg total) by mouth every 8 (eight) hours., Disp: 30 capsule, Rfl: 0   Blood Pressure Monitoring (BLOOD PRESSURE MONITOR AUTOMAT) DEVI, 1 kit by Does not apply route daily. Please call the office if you have any issues obtaining this monitor., Disp: 1 each, Rfl: 0   carvedilol (COREG) 12.5 MG tablet, Take 12.5 mg by mouth 2 (two) times daily with a meal., Disp: , Rfl:    Cholecalciferol (VITAMIN D3) 50 MCG (2000 UT) CHEW, Chew 2,000 Units by mouth daily., Disp: 30 tablet, Rfl: 3   Continuous Blood Gluc Sensor (DEXCOM G6 SENSOR) MISC, PLACE ONE SENSOR EVERY 10 DAYS, Disp: 3 each, Rfl: 11  Continuous Blood Gluc Transmit (DEXCOM G6 TRANSMITTER) MISC, 1  ONCE DAILY FOR 90 DAYS, Disp: 1 each, Rfl: 0   Dulaglutide (TRULICITY) 1.5 FX/8.3AN SOPN, Inject 1.5 mg into the skin once a week., Disp: 2 mL, Rfl: 0   fluticasone-salmeterol (ADVAIR HFA) 115-21 MCG/ACT inhaler, Inhale 2 puffs into the lungs 2 (two) times daily., Disp: 1 each, Rfl: 3   HUMALOG KWIKPEN 200 UNIT/ML KwikPen, INJECT 20 TO 28 UNITS SUBCUTANEOUSLY THREE TIMES DAILY, Disp: 12 mL, Rfl: 0   injection device for insulin (INPEN 100-PINK-NOVO) DEVI, Use as directed with novolog cartridges, Disp: 1 each, Rfl: 0   Insulin Pen Needle (B-D ULTRAFINE III SHORT PEN) 31G X 8 MM MISC, 1 Container by  Does not apply route as needed., Disp: 1 each, Rfl: 11   losartan (COZAAR) 100 MG tablet, Take 100 mg by mouth daily., Disp: , Rfl:    pantoprazole (PROTONIX) 40 MG tablet, Take 1 tablet by mouth once daily, Disp: 30 tablet, Rfl: 3   pregabalin (LYRICA) 75 MG capsule, Take 1 capsule by mouth twice daily, Disp: 60 capsule, Rfl: 0   pregabalin (LYRICA) 75 MG capsule, Take 1 capsule (75 mg total) by mouth 2 (two) times daily., Disp: 60 capsule, Rfl: 3   TRESIBA FLEXTOUCH 200 UNIT/ML FlexTouch Pen, INJECT 50 UNITS SUBCUTANEOUSLY ONCE DAILY FOR 28 DAYS, Disp: 9 mL, Rfl: 0  Social History   Tobacco Use  Smoking Status Never  Smokeless Tobacco Never    Allergies  Allergen Reactions   Lisinopril Cough   Eggs Or Egg-Derived Products Nausea And Vomiting   Contrast Media [Iodinated Contrast Media]    Objective:  There were no vitals filed for this visit. There is no height or weight on file to calculate BMI. Constitutional Well developed. Well nourished.  Vascular Dorsalis pedis pulses palpable bilaterally. Posterior tibial pulses palpable bilaterally. Capillary refill normal to all digits.  No cyanosis or clubbing noted. Pedal hair growth normal.  Neurologic Normal speech. Oriented to person, place, and time. Epicritic sensation to light touch grossly present bilaterally.  Dermatologic Nails thickened elongated dystrophic mycotic nail bilateral hallux. Skin within normal limits  Orthopedic: Normal joint ROM without pain or crepitus bilaterally. No visible deformities. No bony tenderness.   Radiographs: None Assessment:   1. Neuropathic pain   2. Nail fungus   3. Onychomycosis due to dermatophyte    Plan:  Patient was evaluated and treated and all questions answered.  Bilateral hallux onychomycosis -Educated the patient on the etiology of onychomycosis and various treatment options associated with improving the fungal load.  I explained to the patient that there is 3 treatment  options available to treat the onychomycosis including topical, p.o., laser treatment.  Patient elected to undergo p.o. options with Lamisil/terbinafine therapy.  In order for me to start the medication therapy, I explained to the patient the importance of evaluating the liver and obtaining the liver function test.  Once the liver function test comes back normal I will start him on 74-monthcourse of Lamisil therapy.  Patient understood all risk and would like to proceed with Lamisil therapy.  I have asked the patient to immediately stop the Lamisil therapy if she has any reactions to it and call the office or go to the emergency room right away.  Patient states understanding  Neuropathic pain -Lyrica was dispensed to help with neuropathic pain.   No follow-ups on file.

## 2021-11-20 MED ORDER — TERBINAFINE HCL 250 MG PO TABS: 250.0000 mg | ORAL_TABLET | Freq: Every day | ORAL | 0 refills | Status: DC

## 2022-01-22 ENCOUNTER — Encounter: Payer: Self-pay | Admitting: Internal Medicine

## 2022-01-22 ENCOUNTER — Ambulatory Visit (INDEPENDENT_AMBULATORY_CARE_PROVIDER_SITE_OTHER): Payer: Medicaid Other | Admitting: Internal Medicine

## 2022-01-22 VITALS — BP 146/80 | HR 90 | Temp 98.6°F | Ht 62.0 in | Wt 253.2 lb

## 2022-01-22 DIAGNOSIS — R058 Other specified cough: Secondary | ICD-10-CM

## 2022-01-22 DIAGNOSIS — R0609 Other forms of dyspnea: Secondary | ICD-10-CM | POA: Diagnosis not present

## 2022-01-22 DIAGNOSIS — R059 Cough, unspecified: Secondary | ICD-10-CM | POA: Diagnosis not present

## 2022-01-22 MED ORDER — PANTOPRAZOLE SODIUM 40 MG PO TBEC: DELAYED_RELEASE_TABLET | ORAL | 2 refills | Status: DC

## 2022-01-22 MED ORDER — METHYLPREDNISOLONE ACETATE 80 MG/ML IJ SUSP
120.0000 mg | Freq: Once | INTRAMUSCULAR | Status: AC
  Administered 2022-01-22: 120 mg via INTRAMUSCULAR

## 2022-01-22 MED ORDER — BENZONATATE 200 MG PO CAPS: 200.0000 mg | ORAL_CAPSULE | Freq: Three times a day (TID) | ORAL | 1 refills | Status: AC | PRN

## 2022-01-22 NOTE — Assessment & Plan Note (Signed)
Onset with covid 19 infection symptoms started 01/16/19  -  05/23/2019   Walked RA  3 laps @ approx 290ft each @ moderat pace  stopped due to end of study,  no limiting sob/ sats 100% at end despite reported sob at rest - PFTs  12/28/19 wnl x ERV 18% and flat insp loop (see uacs)   Pfts are c/w effects of obesity and possible vcd but no lung issues per se.

## 2022-01-22 NOTE — Progress Notes (Signed)
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      History of Present Illness  05/23/2019  Pulmonary/ 1st office eval/Karina [name]  Chief Complaint  Patient presents with   Pulmonary Consult    SOb X 4 month, coughing without phlegm, chest burn when coughing, post covid   Dyspnea:  Onset at rest lasts   sev minutes even if not coughing / assoc with chest pressure not necessarily related cough/ no nausea/ no diaphoresis  Cough: dry hacking pattern day and night but worese talking, eating  Sleep: on back since covid can't breath/ pillows get her up 30 degrees but bed is flat  SABA use: ventolin ? Helps if takes before ex  Prednisone helped some  rec Increase tessalon to 200 mg three times daily until no longer coughing, then take as needed  Depomedrol 120 mg IM today  Prilosec 20mg  x 2  30 min before breakfast  and x 2 before supper  GERD  Diet   07/02/2019  f/u ov/Karina [name] re: uacs  Chief Complaint  Patient presents with   Follow-up    SOB on exertion and while asleep. Family says she snores. Can exersise a bit longer than usual.    Dyspnea:  Ok at rest now / only doe with steps or if gets in hurry  Cough: much better  Sleeping: no better with freq awakening despite bed blocks/ moderate daytime drowsiness  SABA use: before ex  02: none  Rec Protonix 40 mg Take 30- 60 min before your first and last meals of the day  Try albuterol 15 min before an activity that you know would make you short of breath and see if it makes any difference and if makes none then don't take it after activity unless you can't catch your breath.     01/22/2022 Acute ov/ Karina [name] re: UACS   maint on protonix once daily / no inhalers  Chief Complaint  Patient presents with   Acute Visit    Coughing with choking spells.  Feels like choking on saliva. Occ wheeze. Sx x 2 months  Onset late Nov 2023 bad uri multiple close contacts all neg for covid better x never stopped coughing then covid pos around christmas 2023 and worse cough to point of choking/ occ sub wheeze daytime   Dyspnea:  assoc with choking spells  Cough: dry /sporadic Sleeping: bed is flat lots of pillows / does ok at night  SABA use: not used  02: none  Covid status:   not most recent      No obvious day to day or daytime variability or assoc excess/ purulent sputum or mucus plugs or hemoptysis or cp or chest tightness,   or overt sinus or hb symptoms.   Sleeping  without nocturnal  or early am exacerbation  of respiratory  c/o's or need for noct saba. Also denies any obvious fluctuation of symptoms with weather or environmental changes or other aggravating or alleviating factors except as outlined above   No unusual exposure hx or h/o childhood pna/ asthma or  knowledge of premature birth.  Current Allergies, Complete Past Medical History, Past Surgical History, Family History, and Social History were reviewed in [employer] record.  ROS  The following are not active complaints unless bolded Hoarseness, sore throat, dysphagia(globus/choking) , dental problems, itching, sneezing,  nasal congestion or discharge of excess mucus or purulent secretions, ear ache,   fever, chills, sweats, unintended wt loss or wt gain, classically pleuritic or exertional cp,  orthopnea pnd or arm/hand swelling  or leg swelling, presyncope, palpitations, abdominal pain, anorexia, nausea, vomiting, diarrhea  or change in bowel habits or change in bladder habits, change in stools or change in urine, dysuria, hematuria,  rash, arthralgias, visual complaints, headache, numbness,  weakness or ataxia or problems with walking or coordination,  change in mood or  memory.        Current Meds  Medication Sig   albuterol (VENTOLIN HFA) 108 (90 Base) MCG/ACT inhaler Inhale 1-2 puffs into the lungs every 4 (four) hours as needed for shortness of breath.   atorvastatin (LIPITOR) 80 MG tablet Take 80 mg by mouth daily.   baclofen (LIORESAL) 10 MG tablet Take 1 tablet (10 mg total) by mouth 3 (three) times daily.   benzonatate (TESSALON) 100 MG capsule Take 2 capsules (200 mg total) by mouth every 8 (eight) hours.   Blood Pressure Monitoring (BLOOD PRESSURE MONITOR AUTOMAT) DEVI 1 kit by Does not apply route daily. Please call the office if you have any issues obtaining this monitor.   carvedilol (COREG) 12.5 MG tablet Take 12.5 mg by mouth 2 (two) times daily with a meal.   Cholecalciferol (VITAMIN D3) 50 MCG (2000 UT) CHEW Chew 2,000 Units by mouth daily.   Continuous Blood Gluc Sensor (DEXCOM G6 SENSOR) MISC PLACE ONE SENSOR EVERY 10 DAYS   Continuous Blood Gluc Transmit (DEXCOM G6 TRANSMITTER) MISC 1  ONCE DAILY FOR 90 DAYS   HUMALOG KWIKPEN 200 UNIT/ML KwikPen INJECT 20 TO 28 UNITS SUBCUTANEOUSLY THREE TIMES DAILY   injection device for insulin (INPEN 100-PINK-NOVO) DEVI Use as directed with novolog cartridges   Insulin Pen Needle (B-D ULTRAFINE III SHORT PEN) 31G X 8 MM MISC 1 Container by Does not apply route as needed.   losartan (COZAAR) 100 MG tablet Take 100 mg by mouth daily.   pantoprazole (PROTONIX) 40 MG tablet Take 1 tablet by mouth once daily   pregabalin (LYRICA) 75 MG capsule Take 1 capsule by mouth twice daily   pregabalin (LYRICA) 75 MG capsule Take 1 capsule (75 mg total) by mouth 2 (two) times daily.   Semaglutide,0.25 or 0.5MG /DOS, (OZEMPIC, 0.25 OR 0.5 MG/DOSE,) 2 MG/3ML SOPN Inject into the skin.   terbinafine (LAMISIL) 250 MG tablet Take 1 tablet (250 mg total) by mouth daily.   TRESIBA FLEXTOUCH 200 UNIT/ML FlexTouch Pen INJECT 50 UNITS SUBCUTANEOUSLY  ONCE DAILY FOR 28 DAYS                Past Medical History:  Diagnosis Date   Diabetes mellitus    Type 2, insulin resistant   Syncope 04/08/2015      Objective:     wts  01/22/2022        253 07/02/2019       264   05/23/19 262 lb 9.6 oz (119.1 kg)  05/21/19 259 lb 8 oz (117.7 kg)  04/09/19 250 lb (113.4 kg)     Vital signs reviewed  01/22/2022  - Note at rest 02 sats  100% on RA  General appearance:    MO (by BMI) amb bf nad   HEENT : Oropharynx  clear         NECK :  without  apparent JVD/ palpable Nodes/TM    LUNGS: no acc muscle use,  Nl contour chest which is clear to A and P bilaterally without cough on insp or exp maneuvers   CV:  RRR  no s3 or murmur or increase in P2, and no edema   ABD: quite obese but  soft and nontender    MS:  Nl gait/ ext warm without deformities Or obvious joint restrictions  calf tenderness, cyanosis or clubbing    SKIN: warm and dry without lesions    NEURO:  alert, approp, nl sensorium with  no motor or cerebellar deficits apparent.               Assessment

## 2022-01-22 NOTE — Assessment & Plan Note (Signed)
Body mass index is 46.31 kg/m.  -  trending down/ encouraged  Lab Results  Component Value Date   TSH 2.46 05/24/2019      Contributing to doe and risk of GERD >>>   reviewed the need and the process to achieve and maintain neg calorie balance > defer f/u primary care including intermittently monitoring thyroid status           Each maintenance medication was reviewed in detail including emphasizing most importantly the difference between maintenance and prns and under what circumstances the prns are to be triggered using an action plan format where appropriate.  Total time for H and P, chart review, counseling,  and generating customized AVS unique to this ACUTE  office visit / same day charting = > 30 min for multiple  refractory respiratory  symptoms of uncertain etiology

## 2022-01-22 NOTE — Patient Instructions (Addendum)
Depomedrol 120 mg IM   Any time you start the cough > protonix should be Take 30- 60 min before your first and last meals of the day until cough is gone for at least a week  GERD (REFLUX)  is an extremely common cause of respiratory symptoms just like yours , many times with no obvious heartburn at all.    It can be treated with medication, but also with lifestyle changes including elevation of the head of your bed (ideally with 6 -8inch blocks under the headboard of your bed),  Smoking cessation, avoidance of late meals, excessive alcohol, and avoid fatty foods, chocolate, peppermint, colas, red wine, and acidic juices such as orange juice.  NO MINT OR MENTHOL PRODUCTS SO NO COUGH DROPS  USE SUGARLESS CANDY INSTEAD (Jolley ranchers or Stover's or Life Savers) or even ice chips will also do - the key is to swallow to prevent all throat clearing. NO OIL BASED VITAMINS - use powdered substitutes.  Avoid fish oil when coughing.   For cough > tessalon pearls up o 200 mg every 6 hours as needed   Call for follow up if not 100% better in 2 weeks

## 2022-01-22 NOTE — Assessment & Plan Note (Addendum)
PFTs  12/28/19 wnl x ERV 18% and flat insp loop  -   Flared Nov /dec 2023 due to viral triggers on ppi q am only   Upper airway cough syndrome (previously labeled PNDS),  is so named because it's frequently impossible to sort out how much is  CR/sinusitis with freq throat clearing (which can be related to primary GERD)   vs  causing  secondary (" extra esophageal")  GERD from wide swings in gastric pressure that occur with throat clearing, often  promoting self use of mint and menthol lozenges that reduce the lower esophageal sphincter tone and exacerbate the problem further in a cyclical fashion.   These are the same pts (now being labeled as having "irritable larynx syndrome" by some cough centers) who not infrequently have a history of having failed to tolerate ace inhibitors,  dry powder inhalers or biphosphonates or report having atypical/extraesophageal reflux symptoms that don't respond to standard doses of PPI  and are easily confused as having aecopd or asthma flares by even experienced allergists/ pulmonologists (myself included).   Of the three most common causes of  Sub-acute / recurrent or chronic cough, only one (GERD)  can actually contribute to/ trigger  the other two (asthma and post nasal drip syndrome)  and perpetuate the cylce of cough.  While not intuitively obvious, many patients with chronic low grade reflux do not cough until there is a primary insult that disturbs the protective epithelial barrier and exposes sensitive nerve endings.   This is typically viral but can due to PNDS and  either may apply here though more likely former.  >>>.   The point is that once this occurs, it is difficult to eliminate the cycle  using anything but a maximally effective acid suppression regimen at least in the short run, accompanied by an appropriate diet to address non acid GERD and control / eliminate the cough itself for at least 3 days with tessalon 200 and >>>  added Depomedrol 120 mg IM  in  case of component of Th-2 driven upper or lower airways inflammation (if cough responds short term only to relapse before return while will on full rx for uacs (as above), then  that would point to allergic rhinitis/ asthma or eos bronchitis as alternative dx)   >>> f/u in 2 weeks if not back to baseline.

## 2022-04-15 ENCOUNTER — Ambulatory Visit (INDEPENDENT_AMBULATORY_CARE_PROVIDER_SITE_OTHER): Payer: Medicaid Other

## 2022-04-15 ENCOUNTER — Ambulatory Visit
Admission: EM | Admit: 2022-04-15 | Discharge: 2022-04-15 | Disposition: A | Discharge status: Home / Self Care | Payer: Medicaid Other

## 2022-04-15 DIAGNOSIS — E119 Type 2 diabetes mellitus without complications: Secondary | ICD-10-CM

## 2022-04-15 DIAGNOSIS — R059 Cough, unspecified: Secondary | ICD-10-CM

## 2022-04-15 DIAGNOSIS — Z794 Long term (current) use of insulin: Secondary | ICD-10-CM

## 2022-04-15 DIAGNOSIS — R058 Other specified cough: Secondary | ICD-10-CM | POA: Diagnosis not present

## 2022-04-15 DIAGNOSIS — J018 Other acute sinusitis: Secondary | ICD-10-CM | POA: Diagnosis not present

## 2022-04-15 DIAGNOSIS — R0989 Other specified symptoms and signs involving the circulatory and respiratory systems: Secondary | ICD-10-CM | POA: Diagnosis not present

## 2022-04-15 MED ORDER — PROMETHAZINE-DM 6.25-15 MG/5ML PO SYRP: 5.0000 mL | ORAL_SOLUTION | Freq: Three times a day (TID) | ORAL | 0 refills | Status: DC | PRN

## 2022-04-15 MED ORDER — AMOXICILLIN-POT CLAVULANATE 875-125 MG PO TABS: 1.0000 | ORAL_TABLET | Freq: Two times a day (BID) | ORAL | 0 refills | Status: DC

## 2022-04-15 MED ORDER — LEVOCETIRIZINE DIHYDROCHLORIDE 5 MG PO TABS: 5.0000 mg | ORAL_TABLET | Freq: Every evening | ORAL | 0 refills | Status: DC

## 2022-04-15 NOTE — ED Triage Notes (Signed)
Pt reports cough, nasal congestion, back pain and chest congestion x 1 week Tylenol cold and flu gives no relief.

## 2022-04-15 NOTE — Discharge Instructions (Signed)
We will manage this as a sinus infection with Augmentin. For sore throat or cough try using a honey-based tea. Use 3 teaspoons of honey with juice squeezed from half lemon. Place shaved pieces of ginger into 1/2-1 cup of water and warm over stove top. Then mix the ingredients and repeat every 4 hours as needed. Please take Tylenol 650mg  every 6 hours for throat pain, fevers, aches and pains. Hydrate very well with at least 2 liters of water. Eat light meals such as soups (chicken and noodles, vegetable, chicken and wild rice).  Do not eat foods that you are allergic to.  Taking an antihistamine like Zyrtec can help against postnasal drainage, sinus congestion which can cause sinus pain, sinus headaches, throat pain, painful swallowing, coughing.  You can take this together with pseudoephedrine (Sudafed) at a dose of 30 mg 3 times a day or twice daily as needed for the same kind of nasal drip, congestion.  Use cough medication as needed.   I will call you later today with the x-ray results.

## 2022-04-15 NOTE — ED Provider Notes (Addendum)
[facility]  Note:  This document was prepared using Systems analyst and may include unintentional dictation errors.  MRN: [id number] DOB: [birth year]  Subjective:   Karina [name] is a 37 y.o. female presenting for 1+ week history of persistent and worsening productive cough, chest congestion that is eliciting chest pain and back pain.  She is also had sinus congestion, sinus drainage, malaise and fatigue.  Has been using Tylenol for cold and flu, antihistamines and Sudafed.  Has a history of upper airway cough syndrome.  Reports that they have previously used steroids to help her.  Unfortunately, her diabetes is severely uncontrolled with an A1c of 12%.  She is on insulin. No smoking of any kind including cigarettes, cigars, vaping, marijuana use.    No current facility-administered medications for this encounter.  Current Outpatient Medications:    spironolactone (ALDACTONE) 25 MG tablet, Take by mouth., Disp: , Rfl:    albuterol (VENTOLIN HFA) 108 (90 Base) MCG/ACT inhaler, Inhale 1-2 puffs into the lungs every 4 (four) hours as needed for shortness of breath., Disp: , Rfl:    atorvastatin (LIPITOR) 80 MG tablet, Take 80 mg by mouth daily., Disp: , Rfl:    baclofen (LIORESAL) 10 MG tablet, Take 1 tablet (10 mg total) by mouth 3 (three) times daily., Disp: 30 each, Rfl: 0   benzonatate (TESSALON) 200 MG capsule, Take 1 capsule (200 mg total) by mouth 3 (three) times daily as needed for cough., Disp: 30 capsule, Rfl: 1   Blood Pressure Monitoring (BLOOD PRESSURE MONITOR AUTOMAT) DEVI, 1 kit by Does not apply route daily. Please call the office if you have any issues obtaining this monitor., Disp: 1 each, Rfl: 0   carvedilol (COREG) 12.5 MG tablet, Take 12.5 mg by mouth 2 (two) times daily with a meal., Disp: , Rfl:    Cholecalciferol (VITAMIN D3) 50 MCG (2000 UT) CHEW, Chew 2,000 Units by mouth daily., Disp: 30 tablet, Rfl: 3   Continuous Blood Gluc  Sensor (DEXCOM G6 SENSOR) MISC, PLACE ONE SENSOR EVERY 10 DAYS, Disp: 3 each, Rfl: 11   Continuous Blood Gluc Transmit (DEXCOM G6 TRANSMITTER) MISC, 1  ONCE DAILY FOR 90 DAYS, Disp: 1 each, Rfl: 0   HUMALOG KWIKPEN 200 UNIT/ML KwikPen, INJECT 20 TO 28 UNITS SUBCUTANEOUSLY THREE TIMES DAILY, Disp: 12 mL, Rfl: 0   injection device for insulin (INPEN 100-PINK-NOVO) DEVI, Use as directed with novolog cartridges, Disp: 1 each, Rfl: 0   Insulin Pen Needle (B-D ULTRAFINE III SHORT PEN) 31G X 8 MM MISC, 1 Container by Does not apply route as needed., Disp: 1 each, Rfl: 11   losartan (COZAAR) 100 MG tablet, Take 100 mg by mouth daily., Disp: , Rfl:    pantoprazole (PROTONIX) 40 MG tablet, Take 30- 60 min before your first and last meals of the day, Disp: 60 tablet, Rfl: 2   pregabalin (LYRICA) 75 MG capsule, Take 1 capsule by mouth twice daily, Disp: 60 capsule, Rfl: 0   pregabalin (LYRICA) 75 MG capsule, Take 1 capsule (75 mg total) by mouth 2 (two) times daily., Disp: 60 capsule, Rfl: 3   Semaglutide,0.25 or 0.5MG /DOS, (OZEMPIC, 0.25 OR 0.5 MG/DOSE,) 2 MG/3ML SOPN, Inject into the skin., Disp: , Rfl:    terbinafine (LAMISIL) 250 MG tablet, Take 1 tablet (250 mg total) by mouth daily., Disp: 90 tablet, Rfl: 0   TRESIBA FLEXTOUCH 200 UNIT/ML FlexTouch Pen, INJECT 50 UNITS SUBCUTANEOUSLY ONCE DAILY FOR 28 DAYS, Disp:  9 mL, Rfl: 0   Allergies  Allergen Reactions   Lisinopril Cough   Egg-Derived Products Nausea And Vomiting   Contrast Media [Iodinated Contrast Media]     Past Medical History:  Diagnosis Date   Acute pain of right shoulder 03/13/2019   Allergy    Angio-edema    Asthma    Back pain 12/12/2017   Carpal tunnel syndrome of right wrist 03/13/2019   COVID-19 01/24/2019   Depression 12/27/2012   Diabetes mellitus    Type 2, insulin resistant   Diabetic neuropathy, type II diabetes mellitus 05/23/2013   DOE (dyspnea on exertion) 05/21/2019   Onset with covid 19 infection symptoms started 01/16/19   -  05/23/2019   Walked RA  3 laps @ approx 234ft each @ moderat pace  stopped due to end of study,  no limiting sob/ sats 100% at end despite reported sob at rest    Essential hypertension, benign 12/12/2013   GERD (gastroesophageal reflux disease)    History of COVID-19 03/22/2019   Lower extremity edema 08/29/2017   Moderate nonproliferative diabetic retinopathy 05/24/2013   Both OS and OD.  Diagnosed by Dr. Loyal Buba, MD, PhD 05/23/13. Also mild macular edema. Follows with Dr. 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, with long-term current use of insulin 12/27/2012        Upper airway cough syndrome 03/22/2019     Past Surgical History:  Procedure Laterality Date   CERVICAL BIOPSY  2004   CESAREAN SECTION N/A 06/02/2012   Procedure: CESAREAN SECTION;  Surgeon: Mora Bellman, MD;  Location: [facility];  Service: Obstetrics;  Laterality: N/A;   RETINAL DETACHMENT REPAIR W/ SCLERAL BUCKLE LE Left 10/08/2019    Family History  Problem Relation Age of Onset   Diabetes Father    Stomach cancer Father    Cirrhosis Father    Cancer Mother    Diabetes Mother    Breast cancer Mother    Rectal cancer Neg Hx    Esophageal cancer Neg Hx    Colon cancer Neg Hx     Social History   Tobacco Use   Smoking status: Never   Smokeless tobacco: Never  Vaping Use   Vaping Use: Never used  Substance Use Topics   Alcohol use: No   Drug use: No    ROS   Objective:   Vitals: BP (!) 164/90 (BP Location: Right Arm)   Pulse (!) 102   Temp 97.7 F (36.5 C) (Oral)   Resp 20   LMP 03/28/2022 (Exact Date)   SpO2 95%   Physical Exam Constitutional:      General: She is not in acute distress.     Appearance: Normal appearance. She is well-developed. She is obese. She is ill-appearing. She is not toxic-appearing or diaphoretic.  HENT:     Head: Normocephalic and atraumatic.     Right Ear: Tympanic membrane, ear canal and external ear normal. No drainage or tenderness. No middle ear effusion. There is no impacted cerumen. Tympanic membrane is not erythematous or bulging.     Left Ear: Tympanic membrane, ear canal and external ear normal. No drainage or tenderness.  No middle ear  effusion. There is no impacted cerumen. Tympanic membrane is not erythematous or bulging.     Nose: Congestion and rhinorrhea present.     Mouth/Throat:     Mouth: Mucous membranes are moist. No oral lesions.     Pharynx: No pharyngeal swelling, oropharyngeal exudate, posterior oropharyngeal erythema or uvula swelling.     Tonsils: No tonsillar exudate or tonsillar abscesses.  Eyes:     General: No scleral icterus.       Right eye: No discharge.        Left eye: No discharge.     Extraocular Movements: Extraocular movements intact.     Right eye: Normal extraocular motion.     Left eye: Normal extraocular motion.     Conjunctiva/sclera: Conjunctivae normal.  Cardiovascular:     Rate and Rhythm: Normal rate and regular rhythm.     Heart sounds: Normal heart sounds. No murmur heard.    No friction rub. No gallop.  Pulmonary:     Effort: Pulmonary effort is normal. No respiratory distress.     Breath sounds: No stridor. No wheezing, rhonchi or rales.  Chest:     Chest wall: No tenderness.  Musculoskeletal:     Cervical back: Normal range of motion and neck supple.  Lymphadenopathy:     Cervical: No cervical adenopathy.  Skin:    General: Skin is warm and dry.  Neurological:     General: No focal deficit present.     Mental Status: She is alert and oriented to person, place, and time.  Psychiatric:        Mood and Affect: Mood normal.        Behavior: Behavior normal.     Assessment and Plan :    PDMP not reviewed this encounter.  1. Acute non-recurrent sinusitis of other sinus   2. Upper airway cough syndrome   3. Type 2 diabetes mellitus treated with insulin     X-ray over-read was pending at time of discharge, recommended follow up with only abnormal results. Otherwise will not call for negative over-read. Patient was in agreement.   Will start empiric treatment for sinusitis with Augmentin.  Recommended supportive care otherwise.  Recommended that she restart her albuterol inhaler for her reactive airway disease.  Counseled patient on potential for adverse effects with medications prescribed/recommended today, ER and return-to-clinic precautions discussed, patient verbalized understanding.  Will update treatment plan as necessary based off of the radiology over read.   Jaynee Eagles, [place] 04/15/22 [id number]   DG Chest 2 View  Result Date: 04/15/2022 CLINICAL DATA:  Cough and congestion for 1 week EXAM: CHEST - 2 VIEW COMPARISON:  10/27/2019 FINDINGS: The heart size and mediastinal contours are within normal limits. Both lungs are clear. The visualized skeletal structures are unremarkable. IMPRESSION: No active cardiopulmonary disease. Electronically Signed   By: Inez Catalina M.D.   On: 04/15/2022 09:29    No change to treatment plan.   Jaynee Eagles, [place] 04/15/22 [phone]

## 2022-04-22 ENCOUNTER — Other Ambulatory Visit: Payer: Self-pay

## 2022-04-22 DIAGNOSIS — Z8616 Personal history of COVID-19: Secondary | ICD-10-CM | POA: Insufficient documentation

## 2022-04-22 DIAGNOSIS — R7989 Other specified abnormal findings of blood chemistry: Secondary | ICD-10-CM | POA: Diagnosis present

## 2022-04-22 DIAGNOSIS — E1122 Type 2 diabetes mellitus with diabetic chronic kidney disease: Secondary | ICD-10-CM | POA: Diagnosis not present

## 2022-04-22 DIAGNOSIS — J45909 Unspecified asthma, uncomplicated: Secondary | ICD-10-CM | POA: Diagnosis not present

## 2022-04-22 DIAGNOSIS — Z794 Long term (current) use of insulin: Secondary | ICD-10-CM | POA: Diagnosis not present

## 2022-04-22 DIAGNOSIS — N185 Chronic kidney disease, stage 5: Secondary | ICD-10-CM | POA: Insufficient documentation

## 2022-04-23 ENCOUNTER — Encounter (HOSPITAL_BASED_OUTPATIENT_CLINIC_OR_DEPARTMENT_OTHER): Payer: Self-pay | Admitting: Emergency Medicine

## 2022-04-23 ENCOUNTER — Emergency Department (HOSPITAL_BASED_OUTPATIENT_CLINIC_OR_DEPARTMENT_OTHER)
Admission: EM | Admit: 2022-04-23 | Discharge: 2022-04-23 | Disposition: A | Discharge status: Home / Self Care | Payer: Medicaid Other | Attending: Emergency Medicine | Admitting: Emergency Medicine

## 2022-04-23 ENCOUNTER — Other Ambulatory Visit: Payer: Self-pay

## 2022-04-23 DIAGNOSIS — N185 Chronic kidney disease, stage 5: Secondary | ICD-10-CM

## 2022-04-23 LAB — CBC WITH DIFFERENTIAL/PLATELET
Abs Immature Granulocytes: 0.04 10*3/uL (ref 0.00–0.07)
Basophils Absolute: 0 10*3/uL (ref 0.0–0.1)
Basophils Relative: 0 %
Eosinophils Absolute: 0.4 10*3/uL (ref 0.0–0.5)
Eosinophils Relative: 4 %
HCT: 26.3 % — ABNORMAL LOW (ref 36.0–46.0)
Hemoglobin: 8.6 g/dL — ABNORMAL LOW (ref 12.0–15.0)
Immature Granulocytes: 0 %
Lymphocytes Relative: 25 %
Lymphs Abs: 2.2 10*3/uL (ref 0.7–4.0)
MCH: 27 pg (ref 26.0–34.0)
MCHC: 32.7 g/dL (ref 30.0–36.0)
MCV: 82.7 fL (ref 80.0–100.0)
Monocytes Absolute: 0.3 10*3/uL (ref 0.1–1.0)
Monocytes Relative: 4 %
Neutro Abs: 6 10*3/uL (ref 1.7–7.7)
Neutrophils Relative %: 67 %
Platelets: 466 10*3/uL — ABNORMAL HIGH (ref 150–400)
RBC: 3.18 MIL/uL — ABNORMAL LOW (ref 3.87–5.11)
RDW: 13.1 % (ref 11.5–15.5)
WBC: 8.9 10*3/uL (ref 4.0–10.5)
nRBC: 0 % (ref 0.0–0.2)

## 2022-04-23 LAB — BASIC METABOLIC PANEL
Anion gap: 8 (ref 5–15)
BUN: 44 mg/dL — ABNORMAL HIGH (ref 6–20)
CO2: 21 mmol/L — ABNORMAL LOW (ref 22–32)
Calcium: 9.2 mg/dL (ref 8.9–10.3)
Chloride: 103 mmol/L (ref 98–111)
Creatinine, Ser: 4.95 mg/dL — ABNORMAL HIGH (ref 0.44–1.00)
GFR, Estimated: 11 mL/min — ABNORMAL LOW (ref >60)
Glucose, Bld: 224 mg/dL — ABNORMAL HIGH (ref 70–99)
Potassium: 5.1 mmol/L (ref 3.5–5.1)
Sodium: 132 mmol/L — ABNORMAL LOW (ref 135–145)

## 2022-04-23 NOTE — ED Provider Notes (Signed)
[facility] Provider Note: Lowella Dell, MD, FACEP  CSN: [id number] MRN: [id number] ARRIVAL: 04/22/22 at 2353 ROOM: DB015/DB015   CHIEF COMPLAINT  Labs Only   HISTORY OF PRESENT ILLNESS  04/23/22 1:04 AM Karina [name] is a 37 y.o. female with a history of diabetes and diabetic nephropathy.  She had routine lab work drawn by her PCP 2 days ago.  Creatinine and potassium came back elevated (4.24 and 5.9, respectively) and she was sent here to have her electrolytes redrawn in case there was hemolysis.  She is currently asymptomatic except for having increased fatigue recently.  She is followed by [facility], Dr. Signe Colt.   Past Medical History:  Diagnosis Date   Acute pain of right shoulder 03/13/2019   Allergy    Angio-edema    Asthma    Back pain 12/12/2017   Carpal tunnel syndrome of right wrist 03/13/2019   COVID-19 01/24/2019   Depression 12/27/2012   Diabetes mellitus    Type 2, insulin resistant   Diabetic neuropathy, type II diabetes mellitus 05/23/2013   DOE (dyspnea on exertion) 05/21/2019   Onset with covid 19 infection symptoms started 01/16/19  -  05/23/2019   Walked RA  3 laps @ approx 233ft each @ moderat pace  stopped due to end of study,  no limiting sob/ sats 100% at end despite reported sob at rest    Essential hypertension, benign 12/12/2013   GERD (gastroesophageal reflux disease)    History of COVID-19 03/22/2019   Lower extremity edema 08/29/2017   Moderate nonproliferative diabetic retinopathy 05/24/2013   Both OS and OD.  Diagnosed by Dr. Altamease Oiler, MD, PhD 05/23/13. Also mild macular edema. Follows with Dr. Dione Booze   Morbid obesity 05/25/2013   Nephrotic syndrome due to diabetes mellitus 04/10/2019   Notalgia 09/11/2015   Shortness of breath 03/22/2019   Sleep apnea    doesn't have machine yet   Syncope 04/08/2015   Tachycardia 05/21/2019   Type 2 diabetes mellitus with both eyes affected by moderate nonproliferative retinopathy and  macular edema, with long-term current use of insulin 12/27/2012        Upper airway cough syndrome 03/22/2019    Past Surgical History:  Procedure Laterality Date   CERVICAL BIOPSY  2004   CESAREAN SECTION N/A 06/02/2012   Procedure: CESAREAN SECTION;  Surgeon: Catalina Antigua, MD;  Location: [facility];  Service: Obstetrics;  Laterality: N/A;   RETINAL DETACHMENT REPAIR W/ SCLERAL BUCKLE LE Left 10/08/2019    Family History  Problem Relation Age of Onset   Diabetes Father    Stomach cancer Father    Cirrhosis Father    Cancer Mother    Diabetes Mother    Breast cancer Mother    Rectal cancer Neg Hx    Esophageal cancer Neg Hx    Colon cancer Neg Hx     Social History   Tobacco Use   Smoking status: Never   Smokeless tobacco: Never  Vaping Use   Vaping Use: Never used  Substance Use Topics   Alcohol use: No   Drug use: No    Prior to Admission medications   Medication Sig Start Date End Date Taking? Authorizing Provider  albuterol (VENTOLIN HFA) 108 (90 Base) MCG/ACT inhaler Inhale 1-2 puffs into the lungs every 4 (four) hours as needed for shortness of breath.    [provider]  amoxicillin-clavulanate (AUGMENTIN) 875-125 MG tablet Take 1 tablet by mouth 2 (two) times daily. 04/15/22  Wallis Bamberg, PA-C  atorvastatin (LIPITOR) 80 MG tablet Take 80 mg by mouth daily. 09/10/20   [provider]  baclofen (LIORESAL) 10 MG tablet Take 1 tablet (10 mg total) by mouth 3 (three) times daily. 11/19/20   Shirlean Mylar, MD  benzonatate (TESSALON) 200 MG capsule Take 1 capsule (200 mg total) by mouth 3 (three) times daily as needed for cough. 01/22/22   Nyoka Cowden, MD  Blood Pressure Monitoring (BLOOD PRESSURE MONITOR AUTOMAT) DEVI 1 kit by Does not apply route daily. Please call the office if you have any issues obtaining this monitor. 08/08/19   Melene Plan, MD  carvedilol (COREG) 12.5 MG tablet Take 12.5 mg by mouth 2 (two) times daily with a meal.    [provider]  Cholecalciferol (VITAMIN D3) 50 MCG (2000 UT) CHEW Chew 2,000 Units by mouth daily. 02/06/20   Meccariello, Solmon Ice, MD  Continuous Blood Gluc Sensor (DEXCOM G6 SENSOR) MISC PLACE ONE SENSOR EVERY 10 DAYS 03/30/21   Fayette Pho, MD  Continuous Blood Gluc Transmit (DEXCOM G6 TRANSMITTER) MISC 1  ONCE DAILY FOR 90 DAYS 06/18/21   Autry-Lott, Randa Evens, DO  HUMALOG KWIKPEN 200 UNIT/ML KwikPen INJECT 20 TO 28 UNITS SUBCUTANEOUSLY THREE TIMES DAILY 07/28/20   Fayette Pho, MD  injection device for insulin (INPEN 100-PINK-NOVO) DEVI Use as directed with novolog cartridges 04/10/20   Janit Pagan T, MD  Insulin Pen Needle (B-D ULTRAFINE III SHORT PEN) 31G X 8 MM MISC 1 Container by Does not apply route as needed. 08/08/19   Melene Plan, MD  levocetirizine (XYZAL) 5 MG tablet Take 1 tablet (5 mg total) by mouth every evening. 04/15/22   Wallis Bamberg, PA-C  losartan (COZAAR) 100 MG tablet Take 100 mg by mouth daily.    [provider]  pantoprazole (PROTONIX) 40 MG tablet Take 30- 60 min before your first and last meals of the day 01/22/22   Nyoka Cowden, MD  pregabalin (LYRICA) 75 MG capsule Take 1 capsule by mouth twice daily 01/20/21   Candelaria Stagers, DPM  pregabalin (LYRICA) 75 MG capsule Take 1 capsule (75 mg total) by mouth 2 (two) times daily. 11/19/21   Candelaria Stagers, DPM  promethazine-dextromethorphan (PROMETHAZINE-DM) 6.25-15 MG/5ML syrup Take 5 mLs by mouth 3 (three) times daily as needed for cough. 04/15/22   Wallis Bamberg, PA-C  Semaglutide,0.25 or 0.5MG /DOS, (OZEMPIC, 0.25 OR 0.5 MG/DOSE,) 2 MG/3ML SOPN Inject into the skin.    [provider]  spironolactone (ALDACTONE) 25 MG tablet Take by mouth. 11/25/21   [provider]  terbinafine (LAMISIL) 250 MG tablet Take 1 tablet (250 mg total) by mouth daily. 11/20/21   Candelaria Stagers, DPM  TRESIBA FLEXTOUCH 200 UNIT/ML FlexTouch Pen INJECT 50 UNITS SUBCUTANEOUSLY ONCE DAILY FOR 28 DAYS 07/28/20   Fayette Pho, MD  gabapentin (NEURONTIN) 100 MG capsule Take 1 capsule (100 mg total) by mouth at bedtime. 08/25/18 01/31/19  Lennox Solders, MD    Allergies Lisinopril, Egg-derived products, and Contrast media [iodinated contrast media]   REVIEW OF SYSTEMS  Negative except as noted here or in the History of Present Illness.   PHYSICAL EXAMINATION  Initial Vital Signs Blood pressure (!) 192/101, pulse 96, temperature 98.2 F (36.8 C), temperature source Oral, resp. rate 18, last menstrual period 03/28/2022, SpO2 100 %.  Examination General: Well-developed, well-nourished female in no acute distress; appearance consistent with age of record HENT: normocephalic; atraumatic Eyes: Normal appearance Neck: supple  Heart: regular rate and rhythm Lungs: clear to auscultation bilaterally Abdomen: soft; nondistended; nontender; bowel sounds present Extremities: No deformity; full range of motion; pulses normal Neurologic: Awake, alert and oriented; motor function intact in all extremities and symmetric; no facial droop Skin: Warm and dry Psychiatric: Normal mood and affect   RESULTS  Summary of this visit's results, reviewed and interpreted by myself:   EKG Interpretation  Date/Time:    Ventricular Rate:    PR Interval:    QRS Duration:   QT Interval:    QTC Calculation:   R Axis:     Text Interpretation:         Laboratory Studies: Results for orders placed or performed during the hospital encounter of 04/23/22 (from the past 24 hour(s))  Basic metabolic panel     Status: Abnormal   Collection Time: 04/23/22 12:10 AM  Result Value Ref Range   Sodium 132 (L) 135 - 145 mmol/L   Potassium 5.1 3.5 - 5.1 mmol/L   Chloride 103 98 - 111 mmol/L   CO2 21 (L) 22 - 32 mmol/L   Glucose, Bld 224 (H) 70 - 99 mg/dL   BUN 44 (H) 6 - 20 mg/dL   Creatinine, Ser 6.76 (H) 0.44 - 1.00 mg/dL   Calcium 9.2 8.9 - 19.5 mg/dL   GFR, Estimated 11 (L) >60 mL/min   Anion gap 8 5 - 15  CBC with  Differential     Status: Abnormal   Collection Time: 04/23/22 12:10 AM  Result Value Ref Range   WBC 8.9 4.0 - 10.5 K/uL   RBC 3.18 (L) 3.87 - 5.11 MIL/uL   Hemoglobin 8.6 (L) 12.0 - 15.0 g/dL   HCT 09.3 (L) 26.7 - 12.4 %   MCV 82.7 80.0 - 100.0 fL   MCH 27.0 26.0 - 34.0 pg   MCHC 32.7 30.0 - 36.0 g/dL   RDW 58.0 99.8 - 33.8 %   Platelets 466 (H) 150 - 400 K/uL   nRBC 0.0 0.0 - 0.2 %   Neutrophils Relative % 67 %   Neutro Abs 6.0 1.7 - 7.7 K/uL   Lymphocytes Relative 25 %   Lymphs Abs 2.2 0.7 - 4.0 K/uL   Monocytes Relative 4 %   Monocytes Absolute 0.3 0.1 - 1.0 K/uL   Eosinophils Relative 4 %   Eosinophils Absolute 0.4 0.0 - 0.5 K/uL   Basophils Relative 0 %   Basophils Absolute 0.0 0.0 - 0.1 K/uL   Immature Granulocytes 0 %   Abs Immature Granulocytes 0.04 0.00 - 0.07 K/uL   Imaging Studies: No results found.  ED COURSE and MDM  Nursing notes, initial and subsequent vitals signs, including pulse oximetry, reviewed and interpreted by myself.  Vitals:   04/23/22 0004  BP: (!) 192/101  Pulse: 96  Resp: 18  Temp: 98.2 F (36.8 C)  TempSrc: Oral  SpO2: 100%   Medications - No data to display  Patient's potassium came back at 5.1 which is the high end of normal.  I do not believe she needs any acute treatment in the ED.  Her creatinine is 4.95 which is indicative of worsening kidney failure.  She was advised to contact her nephrologist, Dr. Signe Colt, later today and I will send Dr. Signe Colt a note as well.  She has an appointment with her in 4 days.  The patient's hemoglobin 2 days ago was 9.1 and it is 8.6 today.  Procedures   ED DIAGNOSES     ICD-10-CM  1. Stage 5 chronic kidney disease not on chronic dialysis  N18.5          Eliyana Pagliaro, MD 04/23/22 [phone]

## 2022-04-23 NOTE — ED Triage Notes (Signed)
Arrives POV to ED. Ambulatory to triage. NAD.  Sent by PCP for abnormal lab work (drawn Wed). Concerned for creatinine and K+-unsure if due to hemolysis.   No new physical complaints at this time.

## 2022-04-27 ENCOUNTER — Ambulatory Visit
Admission: EM | Admit: 2022-04-27 | Discharge: 2022-04-27 | Disposition: A | Discharge status: Home / Self Care | Payer: Medicaid Other | Attending: Nurse Practitioner | Admitting: Nurse Practitioner

## 2022-04-27 ENCOUNTER — Encounter: Payer: Self-pay | Admitting: Emergency Medicine

## 2022-04-27 DIAGNOSIS — H6991 Unspecified Eustachian tube disorder, right ear: Secondary | ICD-10-CM | POA: Diagnosis not present

## 2022-04-27 DIAGNOSIS — H9201 Otalgia, right ear: Secondary | ICD-10-CM

## 2022-04-27 MED ORDER — FLUTICASONE PROPIONATE 50 MCG/ACT NA SUSP
1.0000 | Freq: Every day | NASAL | 0 refills | Status: AC
Start: 2022-04-27 — End: ?

## 2022-04-27 MED ORDER — PREDNISONE 20 MG PO TABS
40.0000 mg | ORAL_TABLET | Freq: Every day | ORAL | 0 refills | Status: AC
Start: 2022-04-27 — End: 2022-05-02

## 2022-04-27 NOTE — ED Triage Notes (Signed)
Pt c/o right ear for a couple days that got worse last night. Took tylenol  today at 3am that didn't help.

## 2022-04-27 NOTE — ED Provider Notes (Signed)
[facility]    CSN: [id number] Arrival date & time: 04/27/22  0850      History   Chief Complaint Chief Complaint  Patient presents with   Otalgia    HPI Karina [name] is a 37 y.o. female presents for right ear pain for 2 days.  Patient was started on Augmentin on 4/4 for sinusitis.  She completed this yesterday.  She states the past couple days she has had right ear pain with popping/crackling sensation.  Denies ear drainage, hearing changes, fevers.  She took Tylenol this morning with no improvement.  She has not taken any other OTC medications for her symptoms.  She does have a complex medical history including diabetes that is insulin managed.  No other concerns at this time.   Otalgia   Past Medical History:  Diagnosis Date   Acute pain of right shoulder 03/13/2019   Allergy    Angio-edema    Asthma    Back pain 12/12/2017   Carpal tunnel syndrome of right wrist 03/13/2019   COVID-19 01/24/2019   Depression 12/27/2012   Diabetes mellitus    Type 2, insulin resistant   Diabetic neuropathy, type II diabetes mellitus 05/23/2013   DOE (dyspnea on exertion) 05/21/2019   Onset with covid 19 infection symptoms started 01/16/19  -  05/23/2019   Walked RA  3 laps @ approx 266ft each @ moderat pace  stopped due to end of study,  no limiting sob/ sats 100% at end despite reported sob at rest    Essential hypertension, benign 12/12/2013   GERD (gastroesophageal reflux disease)    History of COVID-19 03/22/2019   Lower extremity edema 08/29/2017   Moderate nonproliferative diabetic retinopathy 05/24/2013   Both OS and OD.  Diagnosed by Dr. Altamease Oiler, MD, PhD 05/23/13. Also mild macular edema. Follows with Dr. Dione Booze   Morbid obesity 05/25/2013   Nephrotic syndrome due to diabetes mellitus 04/10/2019   Notalgia 09/11/2015   Shortness of breath 03/22/2019   Sleep apnea    doesn't have machine yet   Syncope 04/08/2015   Tachycardia 05/21/2019   Type 2 diabetes  mellitus with both eyes affected by moderate nonproliferative retinopathy and macular edema, with long-term current use of insulin 12/27/2012        Upper airway cough syndrome 03/22/2019    Patient Active Problem List   Diagnosis Date Noted   Flank pain 11/20/2020   Pelvic girdle weakness 11/20/2020   Hypertension associated with diabetes 11/20/2020   Stage 3b chronic kidney disease (CKD) 11/20/2020   Hypersomnia, persistent 09/11/2020   OSA on CPAP 09/11/2020   Uncontrolled type 2 diabetes mellitus with hyperglycemia 09/11/2020   PVFS (postviral fatigue syndrome) 09/11/2020   Other allergic rhinitis 03/18/2020   Reactive airway disease 03/18/2020   Adverse reaction to food, subsequent encounter 03/18/2020   Heartburn 03/18/2020   Vitamin D deficiency 09/04/2019   Stable proliferative diabetic retinopathy of both eyes associated with type 2 diabetes mellitus 08/17/2019   Current mild episode of major depressive disorder 08/08/2019   Sleep-related headache 07/05/2019   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, drug or chemical induced, with severe nonproliferative retinopathy 07/05/2019   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, bilateral 07/05/2019   Cephalalgia 07/05/2019   Proliferative diabetic retinopathy of right eye 06/20/2019   Proliferative diabetic retinopathy of left eye 06/20/2019   Vitreous hemorrhage of left eye 06/20/2019   DOE (dyspnea on exertion) 05/21/2019   Tachycardia 05/21/2019  History of COVID-19 03/22/2019   Upper airway cough syndrome 03/22/2019   Shortness of breath 03/22/2019   Carpal tunnel syndrome of right wrist 03/13/2019   Gastroesophageal reflux disease 03/23/2018   Lower extremity edema 08/29/2017   Hypertension associated with stage 2 chronic kidney disease due to type 2 diabetes mellitus (HCC) 12/12/2013   Morbid obesity 05/25/2013   Type 2 diabetes mellitus with both eyes affected by moderate  nonproliferative retinopathy and macular edema, with long-term current use of insulin 12/27/2012   Depression 12/27/2012    Past Surgical History:  Procedure Laterality Date   CERVICAL BIOPSY  2004   CESAREAN SECTION N/A 06/02/2012   Procedure: CESAREAN SECTION;  Surgeon: Catalina Antigua, MD;  Location: [facility];  Service: Obstetrics;  Laterality: N/A;   RETINAL DETACHMENT REPAIR W/ SCLERAL BUCKLE LE Left 10/08/2019    OB History     Gravida  1   Para  1   Term  1   Preterm      AB  0   Living  1      SAB  0   IAB      Ectopic      Multiple      Live Births  1            Home Medications    Prior to Admission medications   Medication Sig Start Date End Date Taking? Authorizing Provider  fluticasone (FLONASE) 50 MCG/ACT nasal spray Place 1 spray into both nostrils daily. 04/27/22  Yes Radford Pax, NP  predniSONE (DELTASONE) 20 MG tablet Take 2 tablets (40 mg total) by mouth daily with breakfast for 5 days. 04/27/22 05/02/22 Yes Radford Pax, NP  albuterol (VENTOLIN HFA) 108 (90 Base) MCG/ACT inhaler Inhale 1-2 puffs into the lungs every 4 (four) hours as needed for shortness of breath.    [provider]  amoxicillin-clavulanate (AUGMENTIN) 875-125 MG tablet Take 1 tablet by mouth 2 (two) times daily. 04/15/22   Wallis Bamberg, PA-C  atorvastatin (LIPITOR) 80 MG tablet Take 80 mg by mouth daily. 09/10/20   [provider]  baclofen (LIORESAL) 10 MG tablet Take 1 tablet (10 mg total) by mouth 3 (three) times daily. 11/19/20   Shirlean Mylar, MD  benzonatate (TESSALON) 200 MG capsule Take 1 capsule (200 mg total) by mouth 3 (three) times daily as needed for cough. 01/22/22   Nyoka Cowden, MD  Blood Pressure Monitoring (BLOOD PRESSURE MONITOR AUTOMAT) DEVI 1 kit by Does not apply route daily. Please call the office if you have any issues obtaining this monitor. 08/08/19   Melene Plan, MD  carvedilol (COREG) 12.5 MG tablet Take 12.5 mg by mouth 2 (two)  times daily with a meal.    [provider]  Cholecalciferol (VITAMIN D3) 50 MCG (2000 UT) CHEW Chew 2,000 Units by mouth daily. 02/06/20   Meccariello, Solmon Ice, MD  Continuous Blood Gluc Sensor (DEXCOM G6 SENSOR) MISC PLACE ONE SENSOR EVERY 10 DAYS 03/30/21   Fayette Pho, MD  Continuous Blood Gluc Transmit (DEXCOM G6 TRANSMITTER) MISC 1  ONCE DAILY FOR 90 DAYS 06/18/21   Autry-Lott, Randa Evens, DO  HUMALOG KWIKPEN 200 UNIT/ML KwikPen INJECT 20 TO 28 UNITS SUBCUTANEOUSLY THREE TIMES DAILY 07/28/20   Fayette Pho, MD  injection device for insulin (INPEN 100-PINK-NOVO) DEVI Use as directed with novolog cartridges 04/10/20   Doreene Eland, MD  Insulin Pen Needle (B-D ULTRAFINE III SHORT PEN) 31G X 8 MM MISC 1 Container by  Does not apply route as needed. 08/08/19   Melene Plan, MD  levocetirizine (XYZAL) 5 MG tablet Take 1 tablet (5 mg total) by mouth every evening. 04/15/22   Wallis Bamberg, PA-C  losartan (COZAAR) 100 MG tablet Take 100 mg by mouth daily.    [provider]  pantoprazole (PROTONIX) 40 MG tablet Take 30- 60 min before your first and last meals of the day 01/22/22   Nyoka Cowden, MD  pregabalin (LYRICA) 75 MG capsule Take 1 capsule by mouth twice daily 01/20/21   Candelaria Stagers, DPM  pregabalin (LYRICA) 75 MG capsule Take 1 capsule (75 mg total) by mouth 2 (two) times daily. 11/19/21   Candelaria Stagers, DPM  promethazine-dextromethorphan (PROMETHAZINE-DM) 6.25-15 MG/5ML syrup Take 5 mLs by mouth 3 (three) times daily as needed for cough. 04/15/22   Wallis Bamberg, PA-C  Semaglutide,0.25 or 0.5MG /DOS, (OZEMPIC, 0.25 OR 0.5 MG/DOSE,) 2 MG/3ML SOPN Inject into the skin.    [provider]  spironolactone (ALDACTONE) 25 MG tablet Take by mouth. 11/25/21   [provider]  terbinafine (LAMISIL) 250 MG tablet Take 1 tablet (250 mg total) by mouth daily. 11/20/21   Candelaria Stagers, DPM  TRESIBA FLEXTOUCH 200 UNIT/ML FlexTouch Pen INJECT 50 UNITS SUBCUTANEOUSLY ONCE  DAILY FOR 28 DAYS 07/28/20   Fayette Pho, MD  gabapentin (NEURONTIN) 100 MG capsule Take 1 capsule (100 mg total) by mouth at bedtime. 08/25/18 01/31/19  Lennox Solders, MD    Family History Family History  Problem Relation Age of Onset   Diabetes Father    Stomach cancer Father    Cirrhosis Father    Cancer Mother    Diabetes Mother    Breast cancer Mother    Rectal cancer Neg Hx    Esophageal cancer Neg Hx    Colon cancer Neg Hx     Social History Social History   Tobacco Use   Smoking status: Never   Smokeless tobacco: Never  Vaping Use   Vaping Use: Never used  Substance Use Topics   Alcohol use: No   Drug use: No     Allergies   Lisinopril, Egg-derived products, and Contrast media [iodinated contrast media]   Review of Systems Review of Systems  HENT:  Positive for ear pain.      Physical Exam Triage Vital Signs ED Triage Vitals  Enc Vitals Group     BP 04/27/22 0902 (!) 156/97     Pulse Rate 04/27/22 0902 89     Resp 04/27/22 0902 18     Temp 04/27/22 0902 98.6 F (37 C)     Temp Source 04/27/22 0902 Oral     SpO2 04/27/22 0902 98 %     Weight --      Height --      Head Circumference --      Peak Flow --      Pain Score 04/27/22 0901 10     Pain Loc --      Pain Edu? --      Excl. in GC? --    No data found.  Updated Vital Signs BP (!) 156/97 (BP Location: Right Arm)   Pulse 89   Temp 98.6 F (37 C) (Oral)   Resp 18   LMP 04/18/2022 (Exact Date)   SpO2 98%   Visual Acuity Right Eye Distance:   Left Eye Distance:   Bilateral Distance:    Right Eye Near:   Left Eye Near:  Bilateral Near:     Physical Exam Vitals and nursing note reviewed.  Constitutional:      Appearance: Normal appearance.  HENT:     Head: Normocephalic and atraumatic.     Right Ear: Ear canal normal. A middle ear effusion is present. Tympanic membrane is not erythematous.     Left Ear: Tympanic membrane and ear canal normal.  Eyes:     Pupils:  Pupils are equal, round, and reactive to light.  Cardiovascular:     Rate and Rhythm: Normal rate and regular rhythm.     Heart sounds: Normal heart sounds.  Pulmonary:     Effort: Pulmonary effort is normal.     Breath sounds: Normal breath sounds.  Skin:    General: Skin is warm and dry.  Neurological:     General: No focal deficit present.     Mental Status: She is alert and oriented to person, place, and time.  Psychiatric:        Mood and Affect: Mood normal.        Behavior: Behavior normal.      UC Treatments / Results  Labs (all labs ordered are listed, but only abnormal results are displayed) Labs Reviewed - No data to display  EKG   Radiology No results found.  Procedures Procedures (including critical care time)  Medications Ordered in UC Medications - No data to display  Initial Impression / Assessment and Plan / UC Course  I have reviewed the triage vital signs and the nursing notes.  Pertinent labs & imaging results that were available during my care of the patient were reviewed by me and considered in my medical decision making (see chart for details).     Start Flonase and prednisone for eustachian tube dysfunction.  Patient instructed that blood sugar may rise on prednisone and she verbalized understanding and states she has been on it before Continue OTC Tylenol as needed Follow-up with PCP or ENT if symptoms do not improve ER precautions reviewed and patient verbalized understanding Final Clinical Impressions(s) / UC Diagnoses   Final diagnoses:  Eustachian tube dysfunction, right  Right ear pain     Discharge Instructions      Start Flonase daily Prednisone daily for 5 days Continue Tylenol as needed Follow-up with your PCP or ENT if your symptoms do not improve Please go to the emergency room for any worsening symptoms Hope you feel better soon!   ED Prescriptions     Medication Sig Dispense Auth. Provider   fluticasone  (FLONASE) 50 MCG/ACT nasal spray Place 1 spray into both nostrils daily. 15.8 mL Radford Pax, NP   predniSONE (DELTASONE) 20 MG tablet Take 2 tablets (40 mg total) by mouth daily with breakfast for 5 days. 10 tablet Radford Pax, NP      PDMP not reviewed this encounter.   Radford Pax, NP 04/27/22 [phone]

## 2022-04-27 NOTE — Discharge Instructions (Signed)
Start Flonase daily Prednisone daily for 5 days Continue Tylenol as needed Follow-up with your PCP or ENT if your symptoms do not improve Please go to the emergency room for any worsening symptoms Hope you feel better soon!

## 2022-04-29 ENCOUNTER — Ambulatory Visit (INDEPENDENT_AMBULATORY_CARE_PROVIDER_SITE_OTHER): Payer: Medicaid Other

## 2022-04-29 ENCOUNTER — Ambulatory Visit: Payer: Medicaid Other | Admitting: Nurse Practitioner

## 2022-04-29 ENCOUNTER — Encounter: Payer: Self-pay | Admitting: Nurse Practitioner

## 2022-04-29 VITALS — BP 156/90 | HR 97 | Temp 97.9°F | Ht 62.0 in | Wt 247.6 lb

## 2022-04-29 DIAGNOSIS — R059 Cough, unspecified: Secondary | ICD-10-CM

## 2022-04-29 DIAGNOSIS — J3089 Other allergic rhinitis: Secondary | ICD-10-CM

## 2022-04-29 DIAGNOSIS — R058 Other specified cough: Secondary | ICD-10-CM | POA: Diagnosis not present

## 2022-04-29 DIAGNOSIS — I10 Essential (primary) hypertension: Secondary | ICD-10-CM

## 2022-04-29 DIAGNOSIS — N184 Chronic kidney disease, stage 4 (severe): Secondary | ICD-10-CM | POA: Diagnosis not present

## 2022-04-29 DIAGNOSIS — R0789 Other chest pain: Secondary | ICD-10-CM | POA: Insufficient documentation

## 2022-04-29 NOTE — Assessment & Plan Note (Signed)
Improved BP today. Down from SBP 180's to 156. Continue working with PCP and nephrology. ED precautions reviewed.

## 2022-04-29 NOTE — Assessment & Plan Note (Signed)
Worsening renal function with significantly elevated creatinine from 2.28 to 4.95 and GFR down to 11. Seeing nephrology tomorrow.

## 2022-04-29 NOTE — Assessment & Plan Note (Signed)
Likely due to persistent coughing. Unable to use NSAIDs with current kidney function. She is currently on steroids and has flexeril available to her. See above plan.

## 2022-04-29 NOTE — Progress Notes (Signed)
@Patient  ID: Karina [name], female    DOB: 1985-03-28, 37 y.o.   MRN: [id number]  Chief Complaint  Patient presents with   Follow-up    Cough//has pain in chest with coughing spells.  [facility] handles patient CPAP progress.    Referring provider: Modesta Messing  HPI: 37 year old female, never smoker followed for upper airway cough. She is a patient of Dr. Thurston Hole and last seen in office 01/22/2022. Past medical history significant for HTN, DM, allergic rhinitis, OSA on CPAP, GERD, CKD stage IV, depression.   TEST/EVENTS:  12/28/2019 PFT: FVC 81, FEV1 83, ratio 86, TLC 96, DLCOcor 99. No BD 04/15/2022 CXR: lungs are clear  01/22/2022: OV with Dr. Sherene Sires. Coughing with choking spells. Occasional wheeze. Symptoms x 2 months. Onset with bad URI in November 2023. Treated with depo inj. GERD and cough control measures. Advised to call if not improved within next 2 weeks.   04/29/2022: Today - acute Patient presents today for acute visit. She was seen at [facility] on 4/4 with reports of 1 week of persistent and worsening productive cough, chest congestion that is lessening chest pain and back pain.  She also reported sinus congestion, sinus drainage, malaise and fatigue.  She had been using Tylenol for cold and flu, antihistamines and Sudafed without much relief at home.  Chest x-ray was clear without any acute infection.  She was treated for acute sinusitis with empiric Augmentin, provided with Promethazine DM cough syrup and advised to start daily allergy pill.   She then went to her PCP on 4/10 with chest wall tenderness.  She had an EKG which was normal.  She was found to be tender upon palpation.  She was sent in a prescription for Flexeril and referred to Ortho for further evaluation.  She also found at the time to have elevated blood pressure reading so they increased her carvedilol.  She also had labs at the time which showed a worsening creatinine, consistent with CKD stage V so  urgent referral to nephrology was placed.  Her potassium levels were also found to be elevated so she was recommended to go to the emergency room for further evaluation.  She presented there on 4/12.  Review of records showed that her potassium had come down to 5.1 but her kidney function remains significantly declined with creatinine 4.95 and GFR of 11.  She also had a CBC at the time which showed anemia with hemoglobin 8.6 and no leukocytosis.  She went back to urgent care on 4/16 with recurrent right ear pain x 2 days.  Exam showed a right-sided middle ear effusion.  She just finished Augmentin so she was advised to start Flonase and prednisone burst. She then returned to her PCP yesterday, 4/17 for follow-up.  She was found to have pitting edema at the time and blood pressure remained elevated.  She was started on hydralazine and 40 mg of Lasix.  They also obtained a BNP for evaluation of CHF; does not look like this has resulted based on the note but did discuss that this can be elevated in the setting of CKD as well.  She was supposed to have a chest x-ray but never had this done. She is seeing nephrology tomorrow.  Today, she tells me that she is still having a cough and sometimes her chest is sore from it. She describes it as either dry or with small amount of clear phlegm. Some better but not resolved. She is  currently on prednisone and is not sure if it is helping but her sinus and ear symptoms are improving. She does feel a little more short winded with longer distances or coughing. She denies any fevers, chills, hemoptysis, calf pain, orthopnea. She feels like her swelling has gotten a little bit better compared to yesterday. Her BP is also improved today; down from SBP 183 to 156. She does have tessalon perles and prothemazine DM cough syrup but she is not using the perles consistently and the cough syrup makes her sleepy so she doesn't like to use it during the day. She is using flonase nasal spray  and xyzal. She takes pantoprazole for reflux, without any breakthrough GERD symptoms. She wears her BiPAP nightly.  Allergies  Allergen Reactions   Lisinopril Cough   Egg-Derived Products Nausea And Vomiting   Contrast Media [Iodinated Contrast Media]     Immunization History  Administered Date(s) Administered   Influenza Split 12/20/2011   Influenza,inj,Quad PF,6+ Mos 11/10/2015   MMR 06/03/2012   PFIZER(Purple Top)SARS-COV-2 Vaccination 04/04/2019, 04/25/2019   Tdap 03/13/2012    Past Medical History:  Diagnosis Date   Acute pain of right shoulder 03/13/2019   Allergy    Angio-edema    Asthma    Back pain 12/12/2017   Carpal tunnel syndrome of right wrist 03/13/2019   COVID-19 01/24/2019   Depression 12/27/2012   Diabetes mellitus    Type 2, insulin resistant   Diabetic neuropathy, type II diabetes mellitus 05/23/2013   DOE (dyspnea on exertion) 05/21/2019   Onset with covid 19 infection symptoms started 01/16/19  -  05/23/2019   Walked RA  3 laps @ approx 236ft each @ moderat pace  stopped due to end of study,  no limiting sob/ sats 100% at end despite reported sob at rest    Essential hypertension, benign 12/12/2013   GERD (gastroesophageal reflux disease)    History of COVID-19 03/22/2019   Lower extremity edema 08/29/2017   Moderate nonproliferative diabetic retinopathy 05/24/2013   Both OS and OD.  Diagnosed by Dr. Altamease Oiler, MD, PhD 05/23/13. Also mild macular edema. Follows with Dr. 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     atorvastatin (LIPITOR) 80 MG tablet Take 80 mg by mouth daily.     baclofen (LIORESAL) 10 MG tablet Take 1 tablet (10 mg total) by mouth 3 (three) times daily. 30 each 0   benzonatate (TESSALON) 200 MG capsule Take 1 capsule (200 mg total) by mouth 3 (three) times daily as needed for cough. 30 capsule 1   Blood Pressure Monitoring (BLOOD PRESSURE MONITOR AUTOMAT) DEVI 1 kit by Does not apply route daily. Please call the office if you have  any issues obtaining this monitor. 1 each 0   carvedilol (COREG) 25 MG tablet Take 25 mg by mouth 2 (two) times daily with a meal.     Cholecalciferol (VITAMIN D3) 50 MCG (2000 UT) CHEW Chew 2,000 Units by mouth daily. 30 tablet 3   Continuous Blood Gluc Transmit (DEXCOM G6 TRANSMITTER) MISC 1  ONCE DAILY FOR 90 DAYS 1 each 0   fluticasone (FLONASE) 50 MCG/ACT nasal spray Place 1 spray into both nostrils daily. 15.8 mL 0   furosemide (LASIX) 20 MG tablet Take 20 mg by mouth daily.     HUMALOG KWIKPEN 200 UNIT/ML KwikPen INJECT 20 TO 28 UNITS SUBCUTANEOUSLY THREE TIMES DAILY 12 mL 0   injection device for insulin (INPEN 100-PINK-NOVO) DEVI Use as directed with novolog cartridges 1 each 0   Insulin Pen Needle (B-D ULTRAFINE III SHORT PEN) 31G X 8 MM MISC 1 Container by Does not apply route as needed. 1 each 11   levocetirizine (XYZAL) 5 MG tablet Take 1 tablet (5 mg total) by mouth every evening. 90 tablet 0   losartan (COZAAR) 100 MG tablet Take 100 mg by mouth daily.     pantoprazole (PROTONIX) 40 MG tablet Take 30- 60 min before your first and last meals of the day 60 tablet 2   predniSONE (DELTASONE) 20 MG tablet Take 2 tablets (40 mg total) by  mouth daily with breakfast for 5 days. 10 tablet 0   pregabalin (LYRICA) 75 MG capsule Take 1 capsule by mouth twice daily 60 capsule 0   promethazine-dextromethorphan (PROMETHAZINE-DM) 6.25-15 MG/5ML syrup Take 5 mLs by mouth 3 (three) times daily as needed for cough. 200 mL 0   Semaglutide,0.25 or 0.5MG /DOS, (OZEMPIC, 0.25 OR 0.5 MG/DOSE,) 2 MG/3ML SOPN Inject into the skin.     TRESIBA FLEXTOUCH 200 UNIT/ML FlexTouch Pen INJECT 50 UNITS SUBCUTANEOUSLY ONCE DAILY FOR 28 DAYS 9 mL 0   amoxicillin-clavulanate (AUGMENTIN) 875-125 MG tablet Take 1 tablet by mouth 2 (two) times daily. (Patient not taking: Reported on 04/29/2022) 20 tablet 0   carvedilol (COREG) 12.5 MG tablet Take 12.5 mg by mouth 2 (two) times daily with a meal. (Patient not taking: Reported on 04/29/2022)     Continuous Blood Gluc Sensor (DEXCOM G6 SENSOR) MISC PLACE ONE SENSOR EVERY 10 DAYS (Patient not taking: Reported on 04/29/2022) 3 each 11   pregabalin (LYRICA) 75 MG capsule Take 1 capsule (75 mg total) by mouth 2 (two) times daily. (Patient not taking: Reported on 04/29/2022) 60 capsule 3   spironolactone (ALDACTONE) 25 MG tablet Take by mouth. (Patient not taking: Reported on 04/29/2022)     terbinafine (LAMISIL) 250 MG tablet Take 1 tablet (250 mg total) by mouth daily. (Patient not taking: Reported on 04/29/2022) 90 tablet 0   No facility-administered medications prior to visit.     Review of Systems:   Constitutional: No night sweats, fevers, chills, fatigue, or lassitude. +weight gain (6 lb) HEENT: No headaches, difficulty swallowing, tooth/dental problems, or sore throat. No sneezing, itching, ear ache, nasal congestion, or post nasal drip CV:  +swelling in lower extremities. No chest pain, orthopnea, PND, anasarca, dizziness, palpitations, syncope Resp: +cough, DOE. No hemoptysis. No wheezing.  No chest wall deformity GI:  No heartburn, indigestion, abdominal pain, nausea, vomiting, diarrhea, change in bowel habits, loss  of appetite, bloody stools.  GU: No dysuria, change in color of urine, urgency or frequency.  No flank pain, no hematuria  Skin: No rash, lesions, ulcerations MSK:  No  joint pain or swelling. +right chest wall pain Neuro: No dizziness or lightheadedness.  Psych: No depression or anxiety. Mood stable.     Physical Exam:  BP (!) 156/90 (BP Location: Right Arm, Patient Position: Sitting, Cuff Size: Large)   Pulse 97   Temp 97.9 F (36.6 C) (Oral)   Ht 5\' 2"  (1.575 m)   Wt 247 lb 9.6 oz (112.3 kg)   LMP 04/18/2022 (Exact Date)   SpO2 98%   BMI 45.29 kg/m   GEN: Pleasant, interactive, well-kempt; morbidly obese; in no acute distress. HEENT:  Normocephalic and atraumatic. EACs patent bilaterally. TM pearly gray with present light reflex bilaterally. PERRLA. Sclera white. Nasal turbinates boggy, moist and patent bilaterally. No rhinorrhea present. Oropharynx pink and moist, without exudate or edema. No lesions, ulcerations, or postnasal drip.  NECK:  Supple w/ fair ROM. No JVD present. Normal carotid impulses w/o bruits. Thyroid symmetrical with no goiter or nodules palpated. No lymphadenopathy.   CV: RRR, no m/r/g, dependent BLE edema. Pulses intact, +2 bilaterally. No cyanosis, pallor or clubbing. PULMONARY:  Unlabored, regular breathing. Clear bilaterally A&P w/o wheezes/rales/rhonchi. No accessory muscle use.  GI: BS present and normoactive. Soft, non-tender to palpation. Protuberant abd. No organomegaly or masses detected. MSK: No erythema, warmth or tenderness. Cap refil <2 sec all extrem. No deformities or joint swelling noted.  Neuro: A/Ox3. No focal deficits noted.   Skin: Warm, no lesions or rashe Psych: Normal affect and behavior. Judgement and thought content appropriate.     Lab Results:  CBC    Component Value Date/Time   WBC 8.9 04/23/2022 0010   RBC 3.18 (L) 04/23/2022 0010   HGB 8.6 (L) 04/23/2022 0010   HGB 11.2 08/25/2018 1102   HCT 26.3 (L) 04/23/2022 0010    HCT 35.2 08/25/2018 1102   PLT 466 (H) 04/23/2022 0010   PLT 410 08/25/2018 1102   MCV 82.7 04/23/2022 0010   MCV 82 08/25/2018 1102   MCH 27.0 04/23/2022 0010   MCHC 32.7 04/23/2022 0010   RDW 13.1 04/23/2022 0010   RDW 13.3 08/25/2018 1102   LYMPHSABS 2.2 04/23/2022 0010   MONOABS 0.3 04/23/2022 0010   EOSABS 0.4 04/23/2022 0010   BASOSABS 0.0 04/23/2022 0010    BMET    Component Value Date/Time   NA 132 (L) 04/23/2022 0010   NA 136 08/31/2019 1425   K 5.1 04/23/2022 0010   CL 103 04/23/2022 0010   CO2 21 (L) 04/23/2022 0010   GLUCOSE 224 (H) 04/23/2022 0010   BUN 44 (H) 04/23/2022 0010   BUN 20 08/31/2019 1425   CREATININE 4.95 (H) 04/23/2022 0010   CREATININE 0.93 03/21/2015 1230   CALCIUM 9.2 04/23/2022 0010   GFRNONAA 11 (L) 04/23/2022 0010   GFRNONAA 82 03/21/2015 1230   GFRAA 48 (L) 10/10/2019 2133   GFRAA >89 03/21/2015 1230    BNP    Component Value Date/Time   BNP 26.8 10/11/2019 1216     Imaging:  DG Chest 2 View  Result Date: 04/29/2022 CLINICAL DATA:  Productive cough. EXAM: CHEST - 2 VIEW COMPARISON:  CXR 04/15/22 FINDINGS: The heart size and mediastinal contours are within normal limits. Both lungs are clear. The visualized skeletal structures are unremarkable. IMPRESSION: No active cardiopulmonary disease. Electronically Signed   By: Lorenza Cambridge M.D.   On: 04/29/2022 11:21   DG Chest 2 View  Result Date: 04/15/2022 CLINICAL DATA:  Cough and congestion for 1 week EXAM: CHEST - 2 VIEW COMPARISON:  10/27/2019 FINDINGS: The heart size and mediastinal contours are within normal limits. Both lungs are clear. The visualized skeletal structures are unremarkable. IMPRESSION: No active cardiopulmonary disease. Electronically Signed   By: Alcide Clever M.D.   On: 04/15/2022 09:29         Latest Ref Rng & Units 12/28/2019    9:07 AM  PFT Results  FVC-Pre L 2.42   FVC-Predicted Pre % 81   FVC-Post L 2.39   FVC-Predicted Post % 80   Pre FEV1/FVC % % 86    Post FEV1/FCV % % 89   FEV1-Pre L 2.09   FEV1-Predicted Pre % 83   FEV1-Post L 2.12   DLCO uncorrected ml/min/mmHg 18.50   DLCO UNC% % 85   DLCO corrected ml/min/mmHg 21.52   DLCO COR %Predicted % 99   DLVA Predicted % 125   TLC L 4.61   TLC % Predicted % 96   RV % Predicted % 128     No results found for: "NITRICOXIDE"      Assessment & Plan:   Upper airway cough syndrome Postviral cough with irritation from PND vs volume overload from worsening renal disease. CXR today without significant pulmonary edema or acute process. Lung exam clear, VS stable and non-toxic. She is currently on prednisone burst. Advised to complete this. Sinus symptoms have improved. She will target cough control measures and notify if no improvement once volume status corrected and with above recommendations. Will consider addition of singulair or could challenge her with ICS/LABA if symptoms persist. Low suspicion for PE based on Wells criteria.   Patient Instructions  Continue Albuterol inhaler 2 puffs every 6 hours as needed for shortness of breath or wheezing Restart benzonatate (tessalon perles) 1 capsule Three times a day for cough. Use consistently over the next few days to get the cough to calm down Continue promethazine DM cough syrup as previously prescribed. Do not drive after taking. May cause drowsiness. You can use delsym or robitussin DM over the counter during the day instead of the promethazine since it's making you so sleepy Continue flonase nasal spray 2 sprays each nostril daily Continue levocetirizine 1 tab daily for allergies/congestion Continue prednisone as previously prescribed. Let me know if your cough gets worse after you complete this Continue pantoprazole 1 tab Twice daily for reflux/indigestion  Upper airway cough syndrome: Suppress your cough to allow your larynx (voice box) to heal.  Limit talking for the next few days. Avoid throat clearing. Work on cough suppression with  the above recommended suppressants.  Use sugar free hard candies or non-menthol cough drops during this time to soothe your throat.  Warm tea with honey and lemon.   Continue BiPAP nightly  Follow up with your primary care provider and nephrology as scheduled.  Follow up in 6 weeks with Dr. Sherene Sires (1st) or Katie Britainy Kozub,NP. If symptoms do not improve or worsen, please contact office for sooner follow up or seek emergency care.    Other allergic rhinitis Resolved sinus symptoms with restarting daily antihistamine and intranasal steroid. See above plan.   CKD (chronic kidney disease), stage IV Worsening renal function with significantly elevated creatinine from 2.28 to 4.95 and GFR down to 11. Seeing nephrology tomorrow.   Costochondral chest pain Likely due to persistent coughing. Unable to use NSAIDs with current kidney function. She is currently on steroids and has flexeril available to her. See above plan.   Hypertension Improved BP today. Down from SBP 180's to 156. Continue working  with PCP and nephrology. ED precautions reviewed.    I spent 42 minutes of dedicated to the care of this patient on the date of this encounter to include pre-visit review of records, face-to-face time with the patient discussing conditions above, post visit ordering of testing, clinical documentation with the electronic health record, making appropriate referrals as documented, and communicating necessary findings to members of the patients care team.  Noemi Chapel, NP 04/29/2022  Pt aware and understands NP's role.

## 2022-04-29 NOTE — Assessment & Plan Note (Signed)
Resolved sinus symptoms with restarting daily antihistamine and intranasal steroid. See above plan.

## 2022-04-29 NOTE — Patient Instructions (Addendum)
Continue Albuterol inhaler 2 puffs every 6 hours as needed for shortness of breath or wheezing Restart benzonatate (tessalon perles) 1 capsule Three times a day for cough. Use consistently over the next few days to get the cough to calm down Continue promethazine DM cough syrup as previously prescribed. Do not drive after taking. May cause drowsiness. You can use delsym or robitussin DM over the counter during the day instead of the promethazine since it's making you so sleepy Continue flonase nasal spray 2 sprays each nostril daily Continue levocetirizine 1 tab daily for allergies/congestion Continue prednisone as previously prescribed. Let me know if your cough gets worse after you complete this Continue pantoprazole 1 tab Twice daily for reflux/indigestion  Upper airway cough syndrome: Suppress your cough to allow your larynx (voice box) to heal.  Limit talking for the next few days. Avoid throat clearing. Work on cough suppression with the above recommended suppressants.  Use sugar free hard candies or non-menthol cough drops during this time to soothe your throat.  Warm tea with honey and lemon.   Continue BiPAP nightly  Follow up with your primary care provider and nephrology as scheduled.  Follow up in 6 weeks with Dr. Sherene Sires (1st) or Katie Maureen Duesing,NP. If symptoms do not improve or worsen, please contact office for sooner follow up or seek emergency care.

## 2022-04-29 NOTE — Assessment & Plan Note (Addendum)
Postviral cough with irritation from PND vs volume overload from worsening renal disease. CXR today without significant pulmonary edema or acute process. Lung exam clear, VS stable and non-toxic. She is currently on prednisone burst. Advised to complete this. Sinus symptoms have improved. She will target cough control measures and notify if no improvement once volume status corrected and with above recommendations. Will consider addition of singulair or could challenge her with ICS/LABA if symptoms persist. Low suspicion for PE based on Wells criteria.   Patient Instructions  Continue Albuterol inhaler 2 puffs every 6 hours as needed for shortness of breath or wheezing Restart benzonatate (tessalon perles) 1 capsule Three times a day for cough. Use consistently over the next few days to get the cough to calm down Continue promethazine DM cough syrup as previously prescribed. Do not drive after taking. May cause drowsiness. You can use delsym or robitussin DM over the counter during the day instead of the promethazine since it's making you so sleepy Continue flonase nasal spray 2 sprays each nostril daily Continue levocetirizine 1 tab daily for allergies/congestion Continue prednisone as previously prescribed. Let me know if your cough gets worse after you complete this Continue pantoprazole 1 tab Twice daily for reflux/indigestion  Upper airway cough syndrome: Suppress your cough to allow your larynx (voice box) to heal.  Limit talking for the next few days. Avoid throat clearing. Work on cough suppression with the above recommended suppressants.  Use sugar free hard candies or non-menthol cough drops during this time to soothe your throat.  Warm tea with honey and lemon.   Continue BiPAP nightly  Follow up with your primary care provider and nephrology as scheduled.  Follow up in 6 weeks with Karina [name] (1st) or Karina Ashby Moskal,NP. If symptoms do not improve or worsen, please contact office for sooner  follow up or seek emergency care.

## 2022-05-04 ENCOUNTER — Ambulatory Visit
Admission: EM | Admit: 2022-05-04 | Discharge: 2022-05-04 | Disposition: A | Discharge status: Home / Self Care | Payer: Medicaid Other | Attending: Urgent Care | Admitting: Urgent Care

## 2022-05-04 DIAGNOSIS — N185 Chronic kidney disease, stage 5: Secondary | ICD-10-CM | POA: Diagnosis present

## 2022-05-04 DIAGNOSIS — E119 Type 2 diabetes mellitus without complications: Secondary | ICD-10-CM | POA: Diagnosis present

## 2022-05-04 DIAGNOSIS — R070 Pain in throat: Secondary | ICD-10-CM | POA: Insufficient documentation

## 2022-05-04 DIAGNOSIS — Z794 Long term (current) use of insulin: Secondary | ICD-10-CM | POA: Insufficient documentation

## 2022-05-04 DIAGNOSIS — J309 Allergic rhinitis, unspecified: Secondary | ICD-10-CM | POA: Diagnosis present

## 2022-05-04 DIAGNOSIS — J040 Acute laryngitis: Secondary | ICD-10-CM | POA: Insufficient documentation

## 2022-05-04 DIAGNOSIS — R0982 Postnasal drip: Secondary | ICD-10-CM | POA: Diagnosis present

## 2022-05-04 LAB — POCT RAPID STREP A (OFFICE): Rapid Strep A Screen: NEGATIVE

## 2022-05-04 MED ORDER — CETIRIZINE HCL 5 MG PO TABS: 5.0000 mg | ORAL_TABLET | ORAL | 0 refills | Status: DC

## 2022-05-04 NOTE — ED Triage Notes (Signed)
Pt c/o sore throat, prod cough, right ear ache x 2 weeks-states she was seen here for sx-NAD-steady gait

## 2022-05-04 NOTE — ED Provider Notes (Signed)
[facility]  Note:  This document was prepared using Conservation officer, historic buildings and may include unintentional dictation errors.  MRN: [id number] DOB: [birth year]  Subjective:   Karina [name] is a 37 y.o. female presenting for recheck on persistent malaise, loss of her voice, throat pain. Still is coughing from the scratching her throat.  Patient has had multiple visits this month for the same.  Has had 2 chest x-rays done both of which were negative, last last completed 04/29/2022.  She also underwent a course of antibiotics and prednisone.  Has a history of allergic rhinitis, reactive airway disease, upper airway cough syndrome.  She is also a type II diabetic treated with insulin and CKD stage V.  Patient is not on dialysis.  She has follow-up with her nephrologist in July but is looking to get a second opinion.  No smoking of any kind including cigarettes, cigars, vaping, marijuana use.  Of note, patient does do a lot of singing.  She is requesting a strep test.  No current facility-administered medications for this encounter.  Current Outpatient Medications:    albuterol (VENTOLIN HFA) 108 (90 Base) MCG/ACT inhaler, Inhale 1-2 puffs into the lungs every 4 (four) hours as needed for shortness of breath., Disp: , Rfl:    amoxicillin-clavulanate (AUGMENTIN) 875-125 MG tablet, Take 1 tablet by mouth 2 (two) times daily. (Patient not taking: Reported on 04/29/2022), Disp: 20 tablet, Rfl: 0   atorvastatin (LIPITOR) 80 MG tablet, Take 80 mg by mouth daily., Disp: , Rfl:    baclofen (LIORESAL) 10 MG tablet, Take 1 tablet (10 mg total) by mouth 3 (three) times daily., Disp: 30 each, Rfl: 0   benzonatate (TESSALON) 200 MG capsule, Take 1 capsule (200 mg total) by mouth 3 (three) times daily as needed for cough., Disp: 30 capsule, Rfl: 1   Blood Pressure Monitoring (BLOOD PRESSURE MONITOR AUTOMAT) DEVI, 1 kit by Does not apply route daily. Please call the office if you  have any issues obtaining this monitor., Disp: 1 each, Rfl: 0   carvedilol (COREG) 12.5 MG tablet, Take 12.5 mg by mouth 2 (two) times daily with a meal. (Patient not taking: Reported on 04/29/2022), Disp: , Rfl:    carvedilol (COREG) 25 MG tablet, Take 25 mg by mouth 2 (two) times daily with a meal., Disp: , Rfl:    Cholecalciferol (VITAMIN D3) 50 MCG (2000 UT) CHEW, Chew 2,000 Units by mouth daily., Disp: 30 tablet, Rfl: 3   Continuous Blood Gluc Sensor (DEXCOM G6 SENSOR) MISC, PLACE ONE SENSOR EVERY 10 DAYS (Patient not taking: Reported on 04/29/2022), Disp: 3 each, Rfl: 11   Continuous Blood Gluc Transmit (DEXCOM G6 TRANSMITTER) MISC, 1  ONCE DAILY FOR 90 DAYS, Disp: 1 each, Rfl: 0   fluticasone (FLONASE) 50 MCG/ACT nasal spray, Place 1 spray into both nostrils daily., Disp: 15.8 mL, Rfl: 0   furosemide (LASIX) 20 MG tablet, Take 20 mg by mouth daily., Disp: , Rfl:    HUMALOG KWIKPEN 200 UNIT/ML KwikPen, INJECT 20 TO 28 UNITS SUBCUTANEOUSLY THREE TIMES DAILY, Disp: 12 mL, Rfl: 0   injection device for insulin (INPEN 100-PINK-NOVO) DEVI, Use as directed with novolog cartridges, Disp: 1 each, Rfl: 0   Insulin Pen Needle (B-D ULTRAFINE III SHORT PEN) 31G X 8 MM MISC, 1 Container by Does not apply route as needed., Disp: 1 each, Rfl: 11   levocetirizine (XYZAL) 5 MG tablet, Take 1 tablet (5 mg total) by mouth every evening.,  Disp: 90 tablet, Rfl: 0   losartan (COZAAR) 100 MG tablet, Take 100 mg by mouth daily., Disp: , Rfl:    pantoprazole (PROTONIX) 40 MG tablet, Take 30- 60 min before your first and last meals of the day, Disp: 60 tablet, Rfl: 2   pregabalin (LYRICA) 75 MG capsule, Take 1 capsule by mouth twice daily, Disp: 60 capsule, Rfl: 0   pregabalin (LYRICA) 75 MG capsule, Take 1 capsule (75 mg total) by mouth 2 (two) times daily. (Patient not taking: Reported on 04/29/2022), Disp: 60 capsule, Rfl: 3   promethazine-dextromethorphan (PROMETHAZINE-DM) 6.25-15 MG/5ML syrup, Take 5 mLs by mouth 3  (three) times daily as needed for cough., Disp: 200 mL, Rfl: 0   Semaglutide,0.25 or 0.5MG /DOS, (OZEMPIC, 0.25 OR 0.5 MG/DOSE,) 2 MG/3ML SOPN, Inject into the skin., Disp: , Rfl:    spironolactone (ALDACTONE) 25 MG tablet, Take by mouth. (Patient not taking: Reported on 04/29/2022), Disp: , Rfl:    terbinafine (LAMISIL) 250 MG tablet, Take 1 tablet (250 mg total) by mouth daily. (Patient not taking: Reported on 04/29/2022), Disp: 90 tablet, Rfl: 0   TRESIBA FLEXTOUCH 200 UNIT/ML FlexTouch Pen, INJECT 50 UNITS SUBCUTANEOUSLY ONCE DAILY FOR 28 DAYS, Disp: 9 mL, Rfl: 0   Allergies  Allergen Reactions   Lisinopril Cough   Egg-Derived Products Nausea And Vomiting   Contrast Media [Iodinated Contrast Media]     Past Medical History:  Diagnosis Date   Acute pain of right shoulder 03/13/2019   Allergy    Angio-edema    Asthma    Back pain 12/12/2017   Carpal tunnel syndrome of right wrist 03/13/2019   COVID-19 01/24/2019   Depression 12/27/2012   Diabetes mellitus    Type 2, insulin resistant   Diabetic neuropathy, type II diabetes mellitus 05/23/2013   DOE (dyspnea on exertion) 05/21/2019   Onset with covid 19 infection symptoms started 01/16/19  -  05/23/2019   Walked RA  3 laps @ approx 247ft each @ moderat pace  stopped due to end of study,  no limiting sob/ sats 100% at end despite reported sob at rest    Essential hypertension, benign 12/12/2013   GERD (gastroesophageal reflux disease)    History of COVID-19 03/22/2019   Lower extremity edema 08/29/2017   Moderate nonproliferative diabetic retinopathy 05/24/2013   Both OS and OD.  Diagnosed by Dr. Altamease Oiler, MD, PhD 05/23/13. Also mild macular edema. Follows with Dr. 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, with long-term current use of insulin 12/27/2012        Upper airway cough syndrome 03/22/2019     Past Surgical History:  Procedure Laterality Date   CERVICAL BIOPSY  2004   CESAREAN SECTION N/A 06/02/2012   Procedure: CESAREAN SECTION;  Surgeon: Catalina Antigua, MD;  Location: [facility];  Service: Obstetrics;  Laterality: N/A;   RETINAL DETACHMENT REPAIR W/ SCLERAL BUCKLE LE Left 10/08/2019    Family History  Problem Relation Age of Onset   Diabetes Father    Stomach cancer Father    Cirrhosis Father    Cancer Mother    Diabetes Mother    Breast cancer Mother    Rectal cancer  Neg Hx    Esophageal cancer Neg Hx    Colon cancer Neg Hx     Social History   Tobacco Use   Smoking status: Never   Smokeless tobacco: Never  Vaping Use   Vaping Use: Never used  Substance Use Topics   Alcohol use: No   Drug use: No    ROS   Objective:   Vitals: BP (!) 146/86 (BP Location: Right Arm)   Pulse 90   Temp 98.6 F (37 C) (Oral)   Resp 20   LMP 04/18/2022 (Exact Date)   SpO2 98%   Physical Exam Constitutional:      General: She is not in acute distress.    Appearance: Normal appearance. She is well-developed and normal weight. She is not ill-appearing, toxic-appearing or diaphoretic.  HENT:     Head: Normocephalic and atraumatic.     Right Ear: Tympanic membrane, ear canal and external ear normal. No drainage or tenderness. No middle ear effusion. There is no impacted cerumen. Tympanic membrane is not erythematous or bulging.     Left Ear: Tympanic membrane, ear canal and external ear normal. No drainage or tenderness.  No middle ear effusion. There is no impacted cerumen. Tympanic membrane is not erythematous or bulging.     Nose: Nose normal. No congestion or rhinorrhea.     Mouth/Throat:     Mouth: Mucous membranes are moist. No oral lesions.     Pharynx: No pharyngeal swelling, oropharyngeal  exudate, posterior oropharyngeal erythema or uvula swelling.     Tonsils: No tonsillar exudate or tonsillar abscesses.  Eyes:     General: No scleral icterus.       Right eye: No discharge.        Left eye: No discharge.     Extraocular Movements: Extraocular movements intact.     Right eye: Normal extraocular motion.     Left eye: Normal extraocular motion.     Conjunctiva/sclera: Conjunctivae normal.  Cardiovascular:     Rate and Rhythm: Normal rate and regular rhythm.     Heart sounds: Normal heart sounds. No murmur heard.    No friction rub. No gallop.  Pulmonary:     Effort: Pulmonary effort is normal. No respiratory distress.     Breath sounds: No stridor. No wheezing, rhonchi or rales.  Chest:     Chest wall: No tenderness.  Musculoskeletal:     Cervical back: Normal range of motion and neck supple.  Lymphadenopathy:     Cervical: No cervical adenopathy.  Skin:    General: Skin is warm and dry.  Neurological:     General: No focal deficit present.     Mental Status: She is alert and oriented to person, place, and time.  Psychiatric:        Mood and Affect: Mood normal.        Behavior: Behavior normal.     Results for orders placed or performed during the hospital encounter of 05/04/22 (from the past 24 hour(s))  POCT rapid strep A     Status: None   Collection Time: 05/04/22 10:00 AM  Result Value Ref Range   Rapid Strep A Screen Negative Negative    Assessment and Plan :   PDMP not reviewed this encounter.  1. Laryngitis   2. Throat pain   3. Post-nasal drainage   4. Allergic rhinitis, unspecified seasonality, unspecified trigger   5. CKD (chronic kidney disease), stage V   6. Type 2 diabetes mellitus  treated with insulin     Strep culture pending.  Recommended conservative management for this.  Discussed antibiotic stewardship and at this stage we will hold off on more antibiotics.  Patient declined a repeat chest x-ray and I am in agreement as she has  already had 2 this month and both have been negative.  Will focus on managing laryngitis with moist rest, Zyrtec 5 mg every 3 days, Flonase, general supportive care. Counseled patient on potential for adverse effects with medications prescribed/recommended today, ER and return-to-clinic precautions discussed, patient verbalized understanding.    Wallis Bamberg, PA-C 05/04/22 1031

## 2022-05-06 LAB — CULTURE, GROUP A STREP (THRC)

## 2022-05-07 LAB — CULTURE, GROUP A STREP (THRC)

## 2022-05-11 ENCOUNTER — Other Ambulatory Visit: Payer: Self-pay

## 2022-05-11 ENCOUNTER — Encounter (HOSPITAL_COMMUNITY): Payer: Self-pay | Admitting: Radiology

## 2022-05-11 ENCOUNTER — Observation Stay (HOSPITAL_COMMUNITY): Payer: Medicaid Other

## 2022-05-11 ENCOUNTER — Emergency Department (HOSPITAL_COMMUNITY): Payer: Medicaid Other

## 2022-05-11 ENCOUNTER — Inpatient Hospital Stay (HOSPITAL_COMMUNITY)
Admission: EM | Admit: 2022-05-11 | Discharge: 2022-05-15 | DRG: 315 | Disposition: A | Discharge status: Home / Self Care | Payer: Medicaid Other | Attending: Internal Medicine | Admitting: Internal Medicine

## 2022-05-11 DIAGNOSIS — Z8616 Personal history of COVID-19: Secondary | ICD-10-CM

## 2022-05-11 DIAGNOSIS — B9781 Human metapneumovirus as the cause of diseases classified elsewhere: Secondary | ICD-10-CM | POA: Diagnosis present

## 2022-05-11 DIAGNOSIS — Z794 Long term (current) use of insulin: Secondary | ICD-10-CM

## 2022-05-11 DIAGNOSIS — R058 Other specified cough: Secondary | ICD-10-CM | POA: Diagnosis present

## 2022-05-11 DIAGNOSIS — D631 Anemia in chronic kidney disease: Secondary | ICD-10-CM | POA: Diagnosis present

## 2022-05-11 DIAGNOSIS — I10 Essential (primary) hypertension: Secondary | ICD-10-CM | POA: Diagnosis not present

## 2022-05-11 DIAGNOSIS — R42 Dizziness and giddiness: Secondary | ICD-10-CM | POA: Diagnosis present

## 2022-05-11 DIAGNOSIS — E861 Hypovolemia: Secondary | ICD-10-CM | POA: Diagnosis not present

## 2022-05-11 DIAGNOSIS — E860 Dehydration: Secondary | ICD-10-CM | POA: Diagnosis present

## 2022-05-11 DIAGNOSIS — Z91199 Patient's noncompliance with other medical treatment and regimen due to unspecified reason: Secondary | ICD-10-CM

## 2022-05-11 DIAGNOSIS — I959 Hypotension, unspecified: Secondary | ICD-10-CM | POA: Diagnosis present

## 2022-05-11 DIAGNOSIS — Z91041 Radiographic dye allergy status: Secondary | ICD-10-CM

## 2022-05-11 DIAGNOSIS — I120 Hypertensive chronic kidney disease with stage 5 chronic kidney disease or end stage renal disease: Secondary | ICD-10-CM | POA: Diagnosis present

## 2022-05-11 DIAGNOSIS — E113313 Type 2 diabetes mellitus with moderate nonproliferative diabetic retinopathy with macular edema, bilateral: Secondary | ICD-10-CM | POA: Diagnosis present

## 2022-05-11 DIAGNOSIS — E1165 Type 2 diabetes mellitus with hyperglycemia: Principal | ICD-10-CM

## 2022-05-11 DIAGNOSIS — Z6841 Body Mass Index (BMI) 40.0 and over, adult: Secondary | ICD-10-CM

## 2022-05-11 DIAGNOSIS — E8809 Other disorders of plasma-protein metabolism, not elsewhere classified: Secondary | ICD-10-CM | POA: Diagnosis present

## 2022-05-11 DIAGNOSIS — Z833 Family history of diabetes mellitus: Secondary | ICD-10-CM

## 2022-05-11 DIAGNOSIS — R000 Tachycardia, unspecified: Secondary | ICD-10-CM | POA: Diagnosis present

## 2022-05-11 DIAGNOSIS — Z79899 Other long term (current) drug therapy: Secondary | ICD-10-CM

## 2022-05-11 DIAGNOSIS — Z803 Family history of malignant neoplasm of breast: Secondary | ICD-10-CM

## 2022-05-11 DIAGNOSIS — Z91012 Allergy to eggs: Secondary | ICD-10-CM

## 2022-05-11 DIAGNOSIS — J45909 Unspecified asthma, uncomplicated: Secondary | ICD-10-CM | POA: Diagnosis present

## 2022-05-11 DIAGNOSIS — I3139 Other pericardial effusion (noninflammatory): Secondary | ICD-10-CM | POA: Diagnosis present

## 2022-05-11 DIAGNOSIS — N185 Chronic kidney disease, stage 5: Secondary | ICD-10-CM | POA: Diagnosis present

## 2022-05-11 DIAGNOSIS — G4733 Obstructive sleep apnea (adult) (pediatric): Secondary | ICD-10-CM | POA: Diagnosis present

## 2022-05-11 DIAGNOSIS — Z888 Allergy status to other drugs, medicaments and biological substances status: Secondary | ICD-10-CM

## 2022-05-11 DIAGNOSIS — Z1152 Encounter for screening for COVID-19: Secondary | ICD-10-CM

## 2022-05-11 DIAGNOSIS — E1122 Type 2 diabetes mellitus with diabetic chronic kidney disease: Secondary | ICD-10-CM | POA: Diagnosis present

## 2022-05-11 DIAGNOSIS — N184 Chronic kidney disease, stage 4 (severe): Secondary | ICD-10-CM | POA: Diagnosis present

## 2022-05-11 DIAGNOSIS — N250 Renal osteodystrophy: Secondary | ICD-10-CM | POA: Diagnosis present

## 2022-05-11 DIAGNOSIS — E1142 Type 2 diabetes mellitus with diabetic polyneuropathy: Secondary | ICD-10-CM | POA: Diagnosis present

## 2022-05-11 DIAGNOSIS — D509 Iron deficiency anemia, unspecified: Secondary | ICD-10-CM | POA: Diagnosis present

## 2022-05-11 DIAGNOSIS — E875 Hyperkalemia: Secondary | ICD-10-CM | POA: Diagnosis present

## 2022-05-11 DIAGNOSIS — K047 Periapical abscess without sinus: Secondary | ICD-10-CM | POA: Diagnosis present

## 2022-05-11 DIAGNOSIS — R296 Repeated falls: Secondary | ICD-10-CM | POA: Diagnosis present

## 2022-05-11 DIAGNOSIS — R7989 Other specified abnormal findings of blood chemistry: Secondary | ICD-10-CM

## 2022-05-11 DIAGNOSIS — R55 Syncope and collapse: Secondary | ICD-10-CM

## 2022-05-11 DIAGNOSIS — Z800 Family history of malignant neoplasm of digestive organs: Secondary | ICD-10-CM

## 2022-05-11 DIAGNOSIS — I9589 Other hypotension: Principal | ICD-10-CM | POA: Diagnosis present

## 2022-05-11 DIAGNOSIS — E872 Acidosis, unspecified: Secondary | ICD-10-CM | POA: Diagnosis not present

## 2022-05-11 LAB — CBC WITH DIFFERENTIAL/PLATELET
Abs Immature Granulocytes: 0.03 10*3/uL (ref 0.00–0.07)
Abs Immature Granulocytes: 0.02 10*3/uL (ref 0.00–0.07)
Basophils Absolute: 0 10*3/uL (ref 0.0–0.1)
Basophils Absolute: 0 10*3/uL (ref 0.0–0.1)
Basophils Relative: 1 %
Basophils Relative: 0 %
Eosinophils Absolute: 0.5 10*3/uL (ref 0.0–0.5)
Eosinophils Absolute: 0.4 10*3/uL (ref 0.0–0.5)
Eosinophils Relative: 6 %
Eosinophils Relative: 6 %
HCT: 28.5 % — ABNORMAL LOW (ref 36.0–46.0)
HCT: 25.9 % — ABNORMAL LOW (ref 36.0–46.0)
Hemoglobin: 9.1 g/dL — ABNORMAL LOW (ref 12.0–15.0)
Hemoglobin: 8.7 g/dL — ABNORMAL LOW (ref 12.0–15.0)
Immature Granulocytes: 0 %
Immature Granulocytes: 0 %
Lymphocytes Relative: 15 %
Lymphocytes Relative: 30 %
Lymphs Abs: 1.2 10*3/uL (ref 0.7–4.0)
Lymphs Abs: 2.1 10*3/uL (ref 0.7–4.0)
MCH: 27.2 pg (ref 26.0–34.0)
MCH: 27.7 pg (ref 26.0–34.0)
MCHC: 31.9 g/dL (ref 30.0–36.0)
MCHC: 33.6 g/dL (ref 30.0–36.0)
MCV: 85.3 fL (ref 80.0–100.0)
MCV: 82.5 fL (ref 80.0–100.0)
Monocytes Absolute: 0.5 10*3/uL (ref 0.1–1.0)
Monocytes Absolute: 0.5 10*3/uL (ref 0.1–1.0)
Monocytes Relative: 7 %
Monocytes Relative: 7 %
Neutro Abs: 5.8 10*3/uL (ref 1.7–7.7)
Neutro Abs: 4 10*3/uL (ref 1.7–7.7)
Neutrophils Relative %: 71 %
Neutrophils Relative %: 57 %
Platelets: 311 10*3/uL (ref 150–400)
Platelets: 313 10*3/uL (ref 150–400)
RBC: 3.34 MIL/uL — ABNORMAL LOW (ref 3.87–5.11)
RBC: 3.14 MIL/uL — ABNORMAL LOW (ref 3.87–5.11)
RDW: 13.6 % (ref 11.5–15.5)
RDW: 13.7 % (ref 11.5–15.5)
WBC: 8 10*3/uL (ref 4.0–10.5)
WBC: 6.9 10*3/uL (ref 4.0–10.5)
nRBC: 0 % (ref 0.0–0.2)
nRBC: 0 % (ref 0.0–0.2)

## 2022-05-11 LAB — COMPREHENSIVE METABOLIC PANEL
ALT: 12 U/L (ref 0–44)
AST: 15 U/L (ref 15–41)
Albumin: 2.4 g/dL — ABNORMAL LOW (ref 3.5–5.0)
Alkaline Phosphatase: 68 U/L (ref 38–126)
Anion gap: 8 (ref 5–15)
BUN: 38 mg/dL — ABNORMAL HIGH (ref 6–20)
CO2: 24 mmol/L (ref 22–32)
Calcium: 8.7 mg/dL — ABNORMAL LOW (ref 8.9–10.3)
Chloride: 102 mmol/L (ref 98–111)
Creatinine, Ser: 5.12 mg/dL — ABNORMAL HIGH (ref 0.44–1.00)
GFR, Estimated: 10 mL/min — ABNORMAL LOW (ref >60)
Glucose, Bld: 234 mg/dL — ABNORMAL HIGH (ref 70–99)
Potassium: 5.2 mmol/L — ABNORMAL HIGH (ref 3.5–5.1)
Sodium: 134 mmol/L — ABNORMAL LOW (ref 135–145)
Total Bilirubin: 0.3 mg/dL (ref 0.3–1.2)
Total Protein: 6.8 g/dL (ref 6.5–8.1)

## 2022-05-11 LAB — PREGNANCY, URINE: Preg Test, Ur: NEGATIVE

## 2022-05-11 LAB — BASIC METABOLIC PANEL
Anion gap: 10 (ref 5–15)
BUN: 41 mg/dL — ABNORMAL HIGH (ref 6–20)
CO2: 22 mmol/L (ref 22–32)
Calcium: 8.6 mg/dL — ABNORMAL LOW (ref 8.9–10.3)
Chloride: 101 mmol/L (ref 98–111)
Creatinine, Ser: 4.88 mg/dL — ABNORMAL HIGH (ref 0.44–1.00)
GFR, Estimated: 11 mL/min — ABNORMAL LOW (ref >60)
Glucose, Bld: 170 mg/dL — ABNORMAL HIGH (ref 70–99)
Potassium: 4.2 mmol/L (ref 3.5–5.1)
Sodium: 133 mmol/L — ABNORMAL LOW (ref 135–145)

## 2022-05-11 LAB — HEMOGLOBIN A1C
Hgb A1c MFr Bld: 10.2 % — ABNORMAL HIGH (ref 4.8–5.6)
Mean Plasma Glucose: 246.04 mg/dL

## 2022-05-11 LAB — CBG MONITORING, ED: Glucose-Capillary: 189 mg/dL — ABNORMAL HIGH (ref 70–99)

## 2022-05-11 LAB — HEPATIC FUNCTION PANEL
ALT: 11 U/L (ref 0–44)
AST: 12 U/L — ABNORMAL LOW (ref 15–41)
Albumin: 2.5 g/dL — ABNORMAL LOW (ref 3.5–5.0)
Alkaline Phosphatase: 68 U/L (ref 38–126)
Bilirubin, Direct: <0.1 mg/dL (ref 0.0–0.2)
Total Bilirubin: 0.3 mg/dL (ref 0.3–1.2)
Total Protein: 6.5 g/dL (ref 6.5–8.1)

## 2022-05-11 LAB — TROPONIN I (HIGH SENSITIVITY)
Troponin I (High Sensitivity): 33 ng/L — ABNORMAL HIGH (ref <18)
Troponin I (High Sensitivity): 37 ng/L — ABNORMAL HIGH (ref <18)
Troponin I (High Sensitivity): 37 ng/L — ABNORMAL HIGH (ref <18)

## 2022-05-11 LAB — GLUCOSE, CAPILLARY: Glucose-Capillary: 171 mg/dL — ABNORMAL HIGH (ref 70–99)

## 2022-05-11 LAB — RETICULOCYTES
Immature Retic Fract: 10.5 % (ref 2.3–15.9)
RBC.: 3.13 MIL/uL — ABNORMAL LOW (ref 3.87–5.11)
Retic Count, Absolute: 47.9 10*3/uL (ref 19.0–186.0)
Retic Ct Pct: 1.5 % (ref 0.4–3.1)

## 2022-05-11 LAB — CK: Total CK: 197 U/L (ref 38–234)

## 2022-05-11 LAB — LACTIC ACID, PLASMA
Lactic Acid, Venous: 0.7 mmol/L (ref 0.5–1.9)
Lactic Acid, Venous: 0.6 mmol/L (ref 0.5–1.9)

## 2022-05-11 LAB — TSH: TSH: 1.774 u[IU]/mL (ref 0.350–4.500)

## 2022-05-11 MED ORDER — SODIUM CHLORIDE 0.9 % IV BOLUS
500.0000 mL | Freq: Once | INTRAVENOUS | Status: AC
  Administered 2022-05-11: 500 mL via INTRAVENOUS

## 2022-05-11 MED ORDER — SODIUM CHLORIDE 0.9 % IV SOLN
3.0000 g | Freq: Two times a day (BID) | INTRAVENOUS | Status: DC
  Administered 2022-05-11 – 2022-05-13 (×4): 3 g via INTRAVENOUS
  Filled 2022-05-11 (×5): qty 8

## 2022-05-11 MED ORDER — ONDANSETRON HCL 4 MG PO TABS
4.0000 mg | ORAL_TABLET | Freq: Four times a day (QID) | ORAL | Status: DC | PRN
  Administered 2022-05-12 – 2022-05-14 (×2): 4 mg via ORAL
  Filled 2022-05-11 (×2): qty 1

## 2022-05-11 MED ORDER — HYDROCODONE-ACETAMINOPHEN 5-325 MG PO TABS
1.0000 | ORAL_TABLET | ORAL | Status: DC | PRN
  Administered 2022-05-11 – 2022-05-13 (×6): 2 via ORAL
  Administered 2022-05-13: 1 via ORAL
  Administered 2022-05-14 (×3): 2 via ORAL
  Administered 2022-05-14: 1 via ORAL
  Filled 2022-05-11: qty 2
  Filled 2022-05-11: qty 1
  Filled 2022-05-11 (×5): qty 2
  Filled 2022-05-11: qty 1
  Filled 2022-05-11 (×4): qty 2

## 2022-05-11 MED ORDER — ACETAMINOPHEN 325 MG PO TABS
650.0000 mg | ORAL_TABLET | Freq: Four times a day (QID) | ORAL | Status: DC | PRN
  Administered 2022-05-15: 650 mg via ORAL
  Filled 2022-05-11: qty 2

## 2022-05-11 MED ORDER — SODIUM CHLORIDE 0.9 % IV SOLN: INTRAVENOUS | Status: AC

## 2022-05-11 MED ORDER — ALBUTEROL SULFATE (2.5 MG/3ML) 0.083% IN NEBU: 2.5000 mg | INHALATION_SOLUTION | RESPIRATORY_TRACT | Status: DC | PRN

## 2022-05-11 MED ORDER — FENTANYL CITRATE PF 50 MCG/ML IJ SOSY
12.5000 ug | PREFILLED_SYRINGE | INTRAMUSCULAR | Status: DC | PRN
  Administered 2022-05-12 (×2): 50 ug via INTRAVENOUS
  Administered 2022-05-12: 12.5 ug via INTRAVENOUS
  Filled 2022-05-11 (×3): qty 1

## 2022-05-11 MED ORDER — ACETAMINOPHEN 650 MG RE SUPP: 650.0000 mg | Freq: Four times a day (QID) | RECTAL | Status: DC | PRN

## 2022-05-11 MED ORDER — GUAIFENESIN ER 600 MG PO TB12
600.0000 mg | ORAL_TABLET | Freq: Two times a day (BID) | ORAL | Status: DC
  Administered 2022-05-13 – 2022-05-15 (×4): 600 mg via ORAL
  Filled 2022-05-11 (×6): qty 1

## 2022-05-11 MED ORDER — PANTOPRAZOLE SODIUM 40 MG PO TBEC
40.0000 mg | DELAYED_RELEASE_TABLET | Freq: Every day | ORAL | Status: DC
  Administered 2022-05-12 – 2022-05-15 (×4): 40 mg via ORAL
  Filled 2022-05-11 (×4): qty 1

## 2022-05-11 MED ORDER — FLUTICASONE PROPIONATE 50 MCG/ACT NA SUSP
1.0000 | Freq: Every day | NASAL | Status: DC
  Administered 2022-05-12 – 2022-05-15 (×4): 1 via NASAL
  Filled 2022-05-11: qty 16

## 2022-05-11 MED ORDER — INSULIN GLARGINE-YFGN 100 UNIT/ML ~~LOC~~ SOLN
35.0000 [IU] | Freq: Every day | SUBCUTANEOUS | Status: DC
  Administered 2022-05-12 – 2022-05-14 (×4): 35 [IU] via SUBCUTANEOUS
  Filled 2022-05-11 (×5): qty 0.35

## 2022-05-11 MED ORDER — INSULIN ASPART 100 UNIT/ML IJ SOLN
0.0000 [IU] | INTRAMUSCULAR | Status: DC
  Administered 2022-05-12: 1 [IU] via SUBCUTANEOUS
  Administered 2022-05-12: 2 [IU] via SUBCUTANEOUS
  Administered 2022-05-12 – 2022-05-13 (×4): 1 [IU] via SUBCUTANEOUS

## 2022-05-11 MED ORDER — ONDANSETRON HCL 4 MG/2ML IJ SOLN
4.0000 mg | Freq: Four times a day (QID) | INTRAMUSCULAR | Status: DC | PRN
  Filled 2022-05-11: qty 2

## 2022-05-11 NOTE — H&P (Signed)
Karina [name] UUV:[id number] DOB: [birth year] DOA: 05/11/2022     PCP: Barnie Mort, PA-C   Outpatient Specialists:  CARDS:   Dr. Wynn Maudlin   NEphrology:   Dr. Janit Pagan at [facility]  Dr. Dolhmeir  Endocrinology  Dr.Levy [facility]  Patient arrived to ER on 05/11/22 at 1539 Referred by Attending Therisa Doyne, MD   Patient coming from:    home Lives With family    Chief Complaint:   Chief Complaint  Patient presents with   Dizziness   Shortness of Breath    HPI: Karina [name] is a 37 y.o. 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  No lower extremity edema  Denies any easy bleeding or bruising  No black stools   No CP but just a slight discomfort When EMS arrived her bp was 102/58 She have not had a good appetite bc she is on ozempic  Reports she has been coughing for at least a month She has been using amoxicilin and prednisone ( 5 day course)  She was supposed to have tooth extraction today bc of infection but did not make it the appointment  She has been taking extra advil to help the pain   Denies significant ETOH intake   Does not smoke   Lab Results  Component Value Date   SARSCOV2NAA Detected (A) 08/11/2020   SARSCOV2NAA NEGATIVE 09/20/2019   SARSCOV2NAA NEGATIVE 05/23/2018   SARSCOV2NAA Not Detected 03/29/2018    Regarding pertinent Chronic problems:    Hyperlipidemia - on statins  Lipitor (atorvastatin)  Lipid Panel     Component Value Date/Time   CHOL 208 (H) 09/14/2018 1502   TRIG 190 (H) 09/14/2018 1502   HDL 52 09/14/2018 1502   CHOLHDL 4.0 09/14/2018 1502   CHOLHDL 3.4 03/21/2015 1230   VLDL 30 03/21/2015 1230   LDLCALC 123 (H) 09/14/2018 1502   LABVLDL 33 09/14/2018 1502     HTN on Coreg her PCP has recently increased it from 12.5 BID to 25 mg po BID 3wks ago , lasix went from 20 mg pt 40mg  po qday was doen 3 wks ago Cozaar 100 mg qday stable    nephrology yesterday increased hydralazine 25 bid to 100 bid yesterday Spironolactone her PCP took her off her nephrologist put her back on and new nephrologit took her back off she has not had it for 1 day    - last echo  Recent Results (from the past 47425 hour(s))  ECHOCARDIOGRAM COMPLETE   Collection Time: 08/09/19 12:17 PM  Result Value   Area-P 1/2 3.21   S' Lateral 2.50   Narrative      ECHOCARDIOGRAM REPORT      IMPRESSIONS    1. Normal GLS -22.3. Left ventricular ejection fraction, by estimation, is 55 to 60%. The left ventricle has normal function. The left ventricle has no regional wall motion abnormalities. Left ventricular diastolic  parameters were normal.  2. Right ventricular systolic function is normal. The right ventricular size is normal.  3. Left atrial size was moderately dilated.  4. The mitral valve is normal in structure. Trivial mitral valve regurgitation. No evidence of mitral stenosis.  5. The aortic valve is tricuspid. Aortic valve regurgitation is not visualized. No aortic stenosis is present.  6. The inferior vena cava is normal in size with greater than 50% respiratory variability, suggesting right atrial pressure of 3 mmHg.             DM 2 -  Lab Results  Component Value Date   HGBA1C 11.5 (A) 01/07/2020   on insulin    Morbid obesity-   BMI Readings from Last 1 Encounters:  05/11/22 43.53 kg/m     OSA - noncompliant with CPAP    CKD stage Iv baseline Cr  V Estimated Creatinine Clearance: 17.2 mL/min (A) (by C-G formula based on SCr of 5.12 mg/dL (H)).  Lab Results  Component Value Date   CREATININE 5.12 (H) 05/11/2022   CREATININE 4.95 (H) 04/23/2022   CREATININE 2.28 (H) 11/14/2020     Chronic anemia - baseline hg Hemoglobin & Hematocrit  Recent Labs    04/23/22 0010 05/11/22 1625  HGB 8.6* 9.1*      While in ER:   Nephrology consulted recommending gentle fluids hold home medications will see in consult    Lab Orders         Comprehensive metabolic panel         CBC with Differential         Pregnancy, urine         CBG monitoring, ED      CXR -  NON acute  CT renal -  non acute    Following Medications were ordered in ER: Medications  sodium chloride 0.9 % bolus 500 mL (500 mLs Intravenous New Bag/Given 05/11/22 1802)    _______________________________________________________ ER Provider Called:   Nephrology     They Recommend admit to medicine   Will see in AM    ED Triage Vitals  Enc Vitals Group     BP 05/11/22 1552 122/73     Pulse Rate 05/11/22 1552 (!) 103     Resp 05/11/22 1552 13     Temp 05/11/22 1552 98.8 F (37.1 C)     Temp Source 05/11/22 1552 Oral     SpO2 05/11/22 1551 99 %     Weight 05/11/22 1557 238 lb (108 kg)     Height 05/11/22 1557 5\' 2"  (1.575 m)     Head Circumference --      Peak Flow --      Pain Score 05/11/22 1552 0     Pain Loc --      Pain Edu? --      Excl. in GC? --   TMAX(24)@     _________________________________________ Significant initial  Findings: Abnormal Labs Reviewed  COMPREHENSIVE METABOLIC PANEL - Abnormal; Notable for the following components:      Result Value   Sodium 134 (*)    Potassium 5.2 (*)    Glucose, Bld 234 (*)    BUN 38 (*)    Creatinine, Ser 5.12 (*)    Calcium 8.7 (*)    Albumin 2.4 (*)    GFR, Estimated 10 (*)    All other components within normal limits  CBC WITH DIFFERENTIAL/PLATELET - Abnormal; Notable for the following components:    RBC 3.34 (*)  Hemoglobin 9.1 (*)    HCT 28.5 (*)    All other components within normal limits  CBG MONITORING, ED - Abnormal; Notable for the following components:   Glucose-Capillary 189 (*)    All other components within normal limits  TROPONIN I (HIGH SENSITIVITY) - Abnormal; Notable for the following components:   Troponin I (High Sensitivity) 33 (*)    All other components within normal limits    _________________________ Troponin   Cardiac Panel (last 3 results) Recent Labs    05/11/22 1625  TROPONINIHS 33*    ECG: Ordered Personally reviewed and interpreted by me showing: HR : 104 Rhythm: Sinus tachycardia Probable anteroseptal infarct, old QTC 454     COVID-19 Labs  No results for input(s): "DDIMER", "FERRITIN", "LDH", "CRP" in the last 72 hours.  Lab Results  Component Value Date   SARSCOV2NAA Detected (A) 08/11/2020   SARSCOV2NAA NEGATIVE 09/20/2019   SARSCOV2NAA NEGATIVE 05/23/2018   SARSCOV2NAA Not Detected 03/29/2018     The recent clinical data is shown below. Vitals:   05/11/22 1551 05/11/22 1552 05/11/22 1557  BP:  122/73   Pulse:  (!) 103   Resp:  13   Temp:  98.8 F (37.1 C)   TempSrc:  Oral   SpO2: 99% 99%   Weight:   108 kg  Height:   5\' 2"  (1.575 m)    WBC     Component Value Date/Time   WBC 8.0 05/11/2022 1625   LYMPHSABS 1.2 05/11/2022 1625   MONOABS 0.5 05/11/2022 1625   EOSABS 0.5 05/11/2022 1625   BASOSABS 0.0 05/11/2022 1625     UA  ordered   Urine analysis:    Component Value Date/Time   COLORURINE YELLOW 11/14/2019 1621   APPEARANCEUR CLEAR 11/14/2019 1621   LABSPEC 1.018 11/14/2019 1621   PHURINE 6.5 11/14/2019 1621   GLUCOSEU TRACE (A) 11/14/2019 1621   HGBUR 1+ (A) 11/14/2019 1621   BILIRUBINUR negative 11/19/2020 1419   BILIRUBINUR NEG 04/07/2015 1443   KETONESUR negative 11/19/2020 1419   KETONESUR NEGATIVE 11/14/2019 1621   PROTEINUR >=300 (A) 11/19/2020 1419   PROTEINUR 3+ (A) 11/14/2019 1621    UROBILINOGEN 0.2 11/19/2020 1419   UROBILINOGEN 0.2 04/09/2013 1115   NITRITE Negative 11/19/2020 1419   NITRITE NEG 04/07/2015 1443   NITRITE NEGATIVE 03/27/2015 1903   LEUKOCYTESUR Negative 11/19/2020 1419    Results for orders placed or performed during the hospital encounter of 05/04/22  Culture, group A strep     Status: None   Collection Time: 05/04/22 10:09 AM   Specimen: Throat  Result Value Ref Range Status   Specimen Description THROAT  Final   Special Requests NONE  Final   Culture   Final    NO GROUP A STREP (S.PYOGENES) ISOLATED Performed at [facility], [address] [place], [place] [address]    Report Status 05/07/2022 FINAL  Final    __________________________________________________________ Recent Labs  Lab 05/11/22 1625  NA 134*  K 5.2*  CO2 24  GLUCOSE 234*  BUN 38*  CREATININE 5.12*  CALCIUM 8.7*    Cr  stable,    Lab Results  Component Value Date   CREATININE 5.12 (H) 05/11/2022   CREATININE 4.95 (H) 04/23/2022   CREATININE 2.28 (H) 11/14/2020    Recent Labs  Lab 05/11/22 1625  AST 15  ALT 12  ALKPHOS 68  BILITOT 0.3  PROT 6.8  ALBUMIN 2.4*   Lab Results  Component Value Date  CALCIUM 8.7 (L) 05/11/2022        Plt: Lab Results  Component Value Date   PLT 311 05/11/2022    Recent Labs  Lab 05/11/22 1625  WBC 8.0  NEUTROABS 5.8  HGB 9.1*  HCT 28.5*  MCV 85.3  PLT 311    HG/HCT  stable,     Component Value Date/Time   HGB 9.1 (L) 05/11/2022 1625   HGB 11.2 08/25/2018 1102   HCT 28.5 (L) 05/11/2022 1625   HCT 35.2 08/25/2018 1102   MCV 85.3 05/11/2022 1625   MCV 82 08/25/2018 1102    _______________________________________________ Hospitalist was called for admission for   Postural dizziness with presyncope  Elevated troponin     The following Work up has been ordered so far:  Orders Placed This Encounter  Procedures   DG Chest 2 View   CT Renal Stone Study   Comprehensive metabolic panel   CBC  with Differential   Pregnancy, urine   Orthostatic vital signs   Bladder scan   Cardiac Monitoring - Continuous Indefinite   Consult to nephrology   Consult for [facility] Admission   CBG monitoring, ED   ED EKG   EKG 12-Lead   EKG 12-Lead   Place in observation (patient's expected length of stay will be less than 2 midnights)     OTHER Significant initial  Findings:  labs showing:  DM  labs:  HbA1C: No results for input(s): "HGBA1C" in the last 8760 hours.     CBG (last 3)  Recent Labs    05/11/22 1758  GLUCAP 189*    Cultures:    Component Value Date/Time   SDES THROAT 05/04/2022 1009   SPECREQUEST NONE 05/04/2022 1009   CULT  05/04/2022 1009    NO GROUP A STREP (S.PYOGENES) ISOLATED Performed at [facility], [address] [place], [place] [address]    REPTSTATUS 05/07/2022 FINAL 05/04/2022 1009     Radiological Exams on Admission: CT Renal Stone Study  Result Date: 05/11/2022 CLINICAL DATA:  Evaluate for bowel obstruction EXAM: CT ABDOMEN AND PELVIS WITHOUT CONTRAST TECHNIQUE: Multidetector CT imaging of the abdomen and pelvis was performed following the standard protocol without IV contrast. RADIATION DOSE REDUCTION: This exam was performed according to the departmental dose-optimization program which includes automated exposure control, adjustment of the mA and/or kV according to patient size and/or use of iterative reconstruction technique. COMPARISON:  CT abdomen and pelvis 11/19/2020 FINDINGS: Lower chest: No acute abnormality. Hepatobiliary: No focal liver abnormality is seen. No gallstones, gallbladder wall thickening, or biliary dilatation. Pancreas: Unremarkable. No pancreatic ductal dilatation or surrounding inflammatory changes. Spleen: Normal in size without focal abnormality. Adrenals/Urinary Tract: Adrenal glands are unremarkable. Kidneys are normal, without renal calculi, focal lesion, or hydronephrosis. Bladder is unremarkable.  Stomach/Bowel: Stomach is within normal limits. Appendix appears normal. No evidence of bowel wall thickening, distention, or inflammatory changes. Vascular/Lymphatic: No significant vascular findings are present. No enlarged abdominal or pelvic lymph nodes. Reproductive: Uterus and bilateral adnexa are unremarkable. Other: No abdominal wall hernia or abnormality. No abdominopelvic ascites. Musculoskeletal: No acute or significant osseous findings. IMPRESSION: 1. No CT evidence of acute abdominal/pelvic process. Electronically Signed   By: Darliss Cheney M.D.   On: 05/11/2022 19:18   DG Chest 2 View  Result Date: 05/11/2022 CLINICAL DATA:  Shortness of breath and dizziness EXAM: CHEST - 2 VIEW COMPARISON:  Radiographs 04/29/2022 FINDINGS: The heart size and mediastinal contours are within normal limits. Both lungs are  clear. The visualized skeletal structures are unremarkable. IMPRESSION: No active cardiopulmonary disease. Electronically Signed   By: Minerva Fester M.D.   On: 05/11/2022 17:53   _______________________________________________________________________________________________________ Latest  Blood pressure 122/73, pulse (!) 103, temperature 98.8 F (37.1 C), temperature source Oral, resp. rate 13, height 5\' 2"  (1.575 m), weight 108 kg, last menstrual period 04/18/2022, SpO2 99 %.   Vitals  labs and radiology finding personally reviewed  Review of Systems:    Pertinent positives include:  fatigue, weight loss  shortness of breath at rest dyspnea on exertion Constitutional:  No weight loss, night sweats, Fevers, chills,  HEENT:  No headaches, Difficulty swallowing,Tooth/dental problems,Sore throat,  No sneezing, itching, ear ache, nasal congestion, post nasal drip,  Cardio-vascular:  No chest pain, Orthopnea, PND, anasarca, dizziness, palpitations.no Bilateral lower extremity swelling  GI:  No heartburn, indigestion, abdominal pain, nausea, vomiting, diarrhea, change in bowel habits,  loss of appetite, melena, blood in stool, hematemesis Resp:  no , No excess mucus, no productive cough, No non-productive cough, No coughing up of blood.No change in color of mucus.No wheezing. Skin:  no rash or lesions. No jaundice GU:  no dysuria, change in color of urine, no urgency or frequency. No straining to urinate.  No flank pain.  Musculoskeletal:  No joint pain or no joint swelling. No decreased range of motion. No back pain.  Psych:  No change in mood or affect. No depression or anxiety. No memory loss.  Neuro: no localizing neurological complaints, no tingling, no weakness, no double vision, no gait abnormality, no slurred speech, no confusion  All systems reviewed and apart from HOPI all are negative _______________________________________________________________________________________________ Past Medical History:   Past Medical History:  Diagnosis Date   Acute pain of right shoulder 03/13/2019   Allergy    Angio-edema    Asthma    Back pain 12/12/2017   Carpal tunnel syndrome of right wrist 03/13/2019   COVID-19 01/24/2019   Depression 12/27/2012   Diabetes mellitus    Type 2, insulin resistant   Diabetic neuropathy, type II diabetes mellitus (HCC) 05/23/2013   DOE (dyspnea on exertion) 05/21/2019   Onset with covid 19 infection symptoms started 01/16/19  -  05/23/2019   Walked RA  3 laps @ approx 257ft each @ moderat pace  stopped due to end of study,  no limiting sob/ sats 100% at end despite reported sob at rest    Essential hypertension, benign 12/12/2013   GERD (gastroesophageal reflux disease)    History of COVID-19 03/22/2019   Lower extremity edema 08/29/2017   Moderate nonproliferative diabetic retinopathy (HCC) 05/24/2013   Both OS and OD.  Diagnosed by Dr. Altamease Oiler, MD, PhD 05/23/13. Also mild macular edema. Follows with Dr. 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 She has never used smokeless tobacco. She reports that she does not drink alcohol and does not use drugs.    Family History:  Family History  Problem Relation Age of Onset   Diabetes Father    Stomach cancer Father    Cirrhosis Father    Cancer Mother    Diabetes Mother    Breast cancer Mother    Rectal cancer Neg Hx    Esophageal cancer Neg Hx    Colon cancer Neg Hx    ______________________________________________________________________________________________ Allergies: Allergies  Allergen Reactions   Lisinopril Cough   Egg-Derived Products Nausea And Vomiting   Contrast Media [Iodinated Contrast Media]      Prior to Admission medications   Medication Sig Start Date End Date Taking? Authorizing Provider  albuterol (VENTOLIN HFA) 108 (90 Base) MCG/ACT inhaler Inhale 1-2 puffs into the lungs every 4 (four) hours as needed for shortness of breath.   Yes [provider]  benzonatate (TESSALON) 200 MG capsule Take 1 capsule (200 mg total) by mouth 3 (three) times daily as needed for cough. 01/22/22  Yes Nyoka Cowden, MD  carvedilol (COREG) 25 MG tablet Take 25 mg by mouth 2 (two) times daily with a meal.   Yes [provider]  cyclobenzaprine  (FLEXERIL) 5 MG tablet 5 mg at bedtime as needed for muscle spasms. 04/21/22  Yes [provider]  hydrALAZINE (APRESOLINE) 100 MG tablet Take 100 mg by mouth 2 (two) times daily. 05/10/22  Yes [provider]  losartan (COZAAR) 100 MG tablet Take 100 mg by mouth daily.   Yes [provider]  promethazine-dextromethorphan (PROMETHAZINE-DM) 6.25-15 MG/5ML syrup Take 5 mLs by mouth 3 (three) times daily as needed for cough. 04/15/22  Yes Wallis Bamberg, PA-C  amoxicillin-clavulanate (AUGMENTIN) 875-125 MG tablet Take 1 tablet by mouth 2 (two) times daily. Patient not taking: Reported on 04/29/2022 04/15/22   Wallis Bamberg, PA-C  atorvastatin (LIPITOR) 80 MG tablet Take 80 mg by mouth daily. Patient not taking: Reported on 05/11/2022 09/10/20   [provider]  baclofen (LIORESAL) 10 MG tablet Take 1 tablet (10 mg total) by mouth 3 (three) times daily. Patient not taking: Reported on 05/11/2022 11/19/20   Shirlean Mylar, MD  Blood Pressure Monitoring (BLOOD PRESSURE MONITOR AUTOMAT) DEVI 1 kit by Does not apply route daily. Please call the office if you have any issues obtaining this monitor. 08/08/19   Melene Plan, MD  carvedilol (COREG) 12.5 MG tablet Take 12.5 mg by mouth 2 (two) times daily with a meal. Patient not taking: Reported on 04/29/2022    [provider]  cetirizine (ZYRTEC) 5 MG tablet Take 1 tablet (5 mg total) by mouth every 3 (three) days. 05/04/22   Wallis Bamberg, PA-C  Cholecalciferol (VITAMIN D3) 50 MCG (2000 UT) CHEW Chew 2,000 Units by mouth daily. 02/06/20   Meccariello, Solmon Ice, MD  Continuous Blood Gluc Sensor (DEXCOM G6 SENSOR) MISC PLACE ONE SENSOR EVERY 10 DAYS Patient not taking: Reported on 04/29/2022 03/30/21   Fayette Pho, MD  Continuous Blood Gluc Transmit (DEXCOM G6 TRANSMITTER) MISC 1  ONCE DAILY FOR 90 DAYS 06/18/21   Autry-Lott, Randa Evens, DO  fluticasone (FLONASE) 50 MCG/ACT nasal spray Place 1 spray into both nostrils daily. 04/27/22    Radford Pax, NP  furosemide (LASIX) 20 MG tablet Take  20 mg by mouth daily.    [provider]  HUMALOG KWIKPEN 200 UNIT/ML KwikPen INJECT 20 TO 28 UNITS SUBCUTANEOUSLY THREE TIMES DAILY 07/28/20   Fayette Pho, MD  injection device for insulin (INPEN 100-PINK-NOVO) DEVI Use as directed with novolog cartridges 04/10/20   Janit Pagan T, MD  Insulin Pen Needle (B-D ULTRAFINE III SHORT PEN) 31G X 8 MM MISC 1 Container by Does not apply route as needed. 08/08/19   Melene Plan, MD  pantoprazole (PROTONIX) 40 MG tablet Take 30- 60 min before your first and last meals of the day 01/22/22   Nyoka Cowden, MD  pregabalin (LYRICA) 75 MG capsule Take 1 capsule by mouth twice daily 01/20/21   Candelaria Stagers, DPM  pregabalin (LYRICA) 75 MG capsule Take 1 capsule (75 mg total) by mouth 2 (two) times daily. Patient not taking: Reported on 04/29/2022 11/19/21   Candelaria Stagers, DPM  Semaglutide,0.25 or 0.5MG /DOS, (OZEMPIC, 0.25 OR 0.5 MG/DOSE,) 2 MG/3ML SOPN Inject into the skin.    [provider]  spironolactone (ALDACTONE) 25 MG tablet Take by mouth. Patient not taking: Reported on 04/29/2022 11/25/21   [provider]  terbinafine (LAMISIL) 250 MG tablet Take 1 tablet (250 mg total) by mouth daily. Patient not taking: Reported on 04/29/2022 11/20/21   Candelaria Stagers, DPM  TRESIBA FLEXTOUCH 200 UNIT/ML FlexTouch Pen INJECT 50 UNITS SUBCUTANEOUSLY ONCE DAILY FOR 28 DAYS 07/28/20   Fayette Pho, MD  gabapentin (NEURONTIN) 100 MG capsule Take 1 capsule (100 mg total) by mouth at bedtime. 08/25/18 01/31/19  Lennox Solders, MD    ___________________________________________________________________________________________________ Physical Exam:    05/11/2022    3:57 PM 05/11/2022    3:52 PM 05/04/2022    9:43 AM  Vitals with BMI  Height 5\' 2"     Weight 238 lbs    BMI 43.52    Systolic  122 146  Diastolic  73 86  Pulse  103 90     1. General:  in No  Acute distress    Chronically ill  -appearing 2. Psychological: Alert and   Oriented 3. Head/ENT:    Dry Mucous Membranes                          Head Non traumatic, neck supple                       Poor Dentition, tenderness over right maxila 4. SKIN:  decreased Skin turgor,  Skin clean Dry and intact no rash    5. Heart: Regular rate and rhythm no  Murmur, no Rub or gallop 6. Lungs: occasional  wheezes or crackles   7. Abdomen: Soft,  non-tender, Non distended   obese  bowel sounds present 8. Lower extremities: no clubbing, cyanosis, no  edema 9. Neurologically Grossly intact, moving all 4 extremities equally   10. MSK: Normal range of motion    Chart has been reviewed  ______________________________________________________________________________________________  Assessment/Plan  37 y.o. 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, with long-term current use of insulin (HCC) Order sliding scale  - Order Sensitive  SSI   - continue home insulin but decreased to  35units,  -  check TSH and HgA1C  - Hold ozempic   Morbid obesity (HCC) Chronic stable will need nutrition follow-up as an outpatient  CKD (chronic kidney disease), stage IV (HCC)  -chronic avoid nephrotoxic medications such as NSAIDs, Vanco Zosyn combo,  avoid hypotension, continue to follow renal function   Postural dizziness with presyncope Due to hypotension   Tooth infection Severe pain and tenderness over right cheek No fver  Able to open  mouht  No drooling  Order unasyn  And maxillofacial Ct Pain been ongoing for the past 3 wks doubt acute surgical indication if CT is concerning will need follow up with oral surgery/ transfer to facility that has oral surgery Pt has no evidence of sepsis or systemic infection   Upper airway cough syndrome Cont albuterol prn Check resp panel  OSA on CPAP Order CPAP qhs  Hypertension Allow permissive htn for now    Other plan as per orders.  DVT prophylaxis:  SCD     Code Status:    Code Status: Prior FULL CODE  as per patient   I had personally discussed CODE STATUS with patient and family  ACP none   Family Communication:   Family at  Bedside  plan of care was discussed with   Sister   Diet  carb modified  Disposition Plan:         To home once workup is complete and patient is stable   Following barriers for discharge:                            Electrolytes corrected                               Anemia corrected                             Pain controlled with PO medications                               Afebrile, white count improving able to transition to PO antibiotics                             Will need to be able to tolerate PO                            Will likely need home health, home O2, set up                           Will need consultants to evaluate patient prior to discharge                     Diabetes care coordinator              Consults called: nephrology is aware   Admission status:  ED Disposition     ED Disposition  Admit   Condition  --   Comment  Hospital Area: [facility] [[id number]]  Level of Care: Progressive [102]  Admit to Progressive based on following criteria: CARDIOVASCULAR & THORACIC of moderate stability with acute coronary syndrome symptoms/low risk myocardial infarction/hypertensive urgency/arrhythmias/heart failure potentially compromising stability and stable post cardiovascular intervention  patients.  May place patient in observation at [facility] or [facility] if equivalent level of care is available:: No  Covid Evaluation: Asymptomatic - no recent exposure (last 10 days) testing not required  Diagnosis: Hypotension [132440]  Admitting Physician: Therisa Doyne [[id number]]  Attending Physician: Therisa Doyne [[id number]]           Obs    Level of care       progressive tele indefinitely please discontinue once patient no longer qualifies COVID-19 Labs    Kristyl Athens 05/11/2022, 8:57 PM    [facility]     after 2 AM please page floor coverage PA If 7AM-7PM, please contact the day team taking care of the patient using [email or web address]

## 2022-05-11 NOTE — Assessment & Plan Note (Signed)
-  chronic avoid nephrotoxic medications such as NSAIDs, Vanco Zosyn combo,  avoid hypotension, continue to follow renal function  

## 2022-05-11 NOTE — Assessment & Plan Note (Signed)
Allow permissive htn for now 

## 2022-05-11 NOTE — ED Triage Notes (Signed)
Pt states she was speed walking to her daughter bus stop because she thought she was going to be late and became short of breath as well as dizzy. She states that she sat down and called 911. Pt states she is being treated for hyperkalemia with lasix's and spironolactone. Pt states prior to walking she took 100 mg hydralazine and 25 mg carvedilol. Pt did state that her doctor stopped the spironolactone yesterday.

## 2022-05-11 NOTE — Assessment & Plan Note (Signed)
Due to hypotension

## 2022-05-11 NOTE — ED Provider Notes (Signed)
[place] EMERGENCY DEPARTMENT AT [facility] Provider Note   CSN: [id number] Arrival date & time: 05/11/22  1539     History  Chief Complaint  Patient presents with   Dizziness   Shortness of Breath    Rachna P Sigler is a 37 y.o. 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  She states that she was walking to the bus stop, when she became very aware that everything was very bright, and felt like she was going to pass out.  She sat down because she was concerned she was going to pass out, took some breaths, and the brightness is sided.  She states that she is concerned that has only to do with her medications since she was just changed from hydralazine 25 mg twice daily to 100 mg twice daily.  Spironolactone 25mg  was stopped. She also endorses some shortness of breath, but states this is been getting worse over several weeks.  Denies any swelling of bilateral lower extremities.    Home Medications Prior to Admission medications   Medication Sig Start Date End Date Taking? Authorizing Provider  albuterol (VENTOLIN HFA) 108 (90 Base) MCG/ACT inhaler Inhale 1-2 puffs into the lungs every 4 (four) hours as needed for shortness of breath.    [provider]  amoxicillin-clavulanate (AUGMENTIN) 875-125 MG tablet Take 1 tablet by mouth 2 (two) times daily. Patient not taking: Reported on 04/29/2022 04/15/22   Wallis Bamberg, PA-C  atorvastatin (LIPITOR) 80 MG tablet Take 80 mg by mouth daily. 09/10/20   [provider]  baclofen (LIORESAL) 10 MG tablet Take 1 tablet (10 mg total) by mouth 3 (three) times daily. 11/19/20   Shirlean Mylar, MD  benzonatate (TESSALON) 200 MG capsule Take 1 capsule (200 mg total) by mouth 3 (three) times daily as needed for cough. 01/22/22   Nyoka Cowden, MD  Blood Pressure Monitoring (BLOOD PRESSURE MONITOR AUTOMAT) DEVI 1 kit by Does not apply  route daily. Please call the office if you have any issues obtaining this monitor. 08/08/19   Melene Plan, MD  carvedilol (COREG) 12.5 MG tablet Take 12.5 mg by mouth 2 (two) times daily with a meal. Patient not taking: Reported on 04/29/2022    [provider]  carvedilol (COREG) 25 MG tablet Take 25 mg by mouth 2 (two) times daily with a meal.    [provider]  cetirizine (ZYRTEC) 5 MG tablet Take 1 tablet (5 mg total) by mouth every 3 (three) days. 05/04/22   Wallis Bamberg, PA-C  Cholecalciferol (VITAMIN D3) 50 MCG (2000 UT) CHEW Chew 2,000 Units by mouth daily. 02/06/20   Meccariello, Solmon Ice, MD  Continuous Blood Gluc Sensor (DEXCOM G6 SENSOR) MISC PLACE ONE SENSOR EVERY 10 DAYS Patient not taking: Reported on 04/29/2022 03/30/21   Fayette Pho, MD  Continuous Blood Gluc Transmit (DEXCOM G6 TRANSMITTER) MISC 1  ONCE DAILY FOR 90 DAYS 06/18/21   Autry-Lott, Randa Evens, DO  fluticasone (FLONASE) 50 MCG/ACT nasal spray Place 1 spray into both nostrils daily. 04/27/22   Radford Pax, NP  furosemide (LASIX) 20 MG tablet Take 20 mg by mouth daily.    [provider]  HUMALOG KWIKPEN 200 UNIT/ML KwikPen INJECT 20 TO 28 UNITS SUBCUTANEOUSLY THREE TIMES DAILY 07/28/20   Fayette Pho, MD  injection device for insulin (INPEN 100-PINK-NOVO) DEVI Use as directed with novolog cartridges 04/10/20  Doreene Eland, MD  Insulin Pen Needle (B-D ULTRAFINE III SHORT PEN) 31G X 8 MM MISC 1 Container by Does not apply route as needed. 08/08/19   Melene Plan, MD  losartan (COZAAR) 100 MG tablet Take 100 mg by mouth daily.    [provider]  pantoprazole (PROTONIX) 40 MG tablet Take 30- 60 min before your first and last meals of the day 01/22/22   Nyoka Cowden, MD  pregabalin (LYRICA) 75 MG capsule Take 1 capsule by mouth twice daily 01/20/21   Candelaria Stagers, DPM  pregabalin (LYRICA) 75 MG capsule Take 1 capsule (75 mg total) by mouth 2 (two) times daily. Patient not taking:  Reported on 04/29/2022 11/19/21   Candelaria Stagers, DPM  promethazine-dextromethorphan (PROMETHAZINE-DM) 6.25-15 MG/5ML syrup Take 5 mLs by mouth 3 (three) times daily as needed for cough. 04/15/22   Wallis Bamberg, PA-C  Semaglutide,0.25 or 0.5MG /DOS, (OZEMPIC, 0.25 OR 0.5 MG/DOSE,) 2 MG/3ML SOPN Inject into the skin.    [provider]  spironolactone (ALDACTONE) 25 MG tablet Take by mouth. Patient not taking: Reported on 04/29/2022 11/25/21   [provider]  terbinafine (LAMISIL) 250 MG tablet Take 1 tablet (250 mg total) by mouth daily. Patient not taking: Reported on 04/29/2022 11/20/21   Candelaria Stagers, DPM  TRESIBA FLEXTOUCH 200 UNIT/ML FlexTouch Pen INJECT 50 UNITS SUBCUTANEOUSLY ONCE DAILY FOR 28 DAYS 07/28/20   Fayette Pho, MD  gabapentin (NEURONTIN) 100 MG capsule Take 1 capsule (100 mg total) by mouth at bedtime. 08/25/18 01/31/19  Lennox Solders, MD      Allergies    Lisinopril, Egg-derived products, and Contrast media [iodinated contrast media]    Review of Systems   Review of Systems  Respiratory:  Positive for shortness of breath. Negative for cough.   Neurological:  Positive for dizziness.    Physical Exam Updated Vital Signs BP 122/73 (BP Location: Right Arm)   Pulse (!) 103   Temp 98.8 F (37.1 C) (Oral)   Resp 13   Ht 5\' 2"  (1.575 m)   Wt 108 kg   LMP 04/18/2022 (Exact Date)   SpO2 99%   BMI 43.53 kg/m  Physical Exam Vitals and nursing note reviewed.  Constitutional:      General: She is not in acute distress.    Appearance: She is well-developed.  HENT:     Head: Normocephalic and atraumatic.  Eyes:     Conjunctiva/sclera: Conjunctivae normal.  Cardiovascular:     Rate and Rhythm: Normal rate and regular rhythm.     Heart sounds: No murmur heard. Pulmonary:     Effort: Pulmonary effort is normal. No respiratory distress.  Abdominal:     Palpations: Abdomen is soft.     Tenderness: There is no abdominal tenderness.  Musculoskeletal:         General: No swelling.     Cervical back: Neck supple.  Skin:    General: Skin is warm and dry.     Capillary Refill: Capillary refill takes less than 2 seconds.  Neurological:     Mental Status: She is alert.  Psychiatric:        Mood and Affect: Mood normal.     ED Results / Procedures / Treatments   Labs (all labs ordered are listed, but only abnormal results are displayed) Labs Reviewed  COMPREHENSIVE METABOLIC PANEL - Abnormal; Notable for the following components:      Result Value   Sodium 134 (*)  Potassium 5.2 (*)    Glucose, Bld 234 (*)    BUN 38 (*)    Creatinine, Ser 5.12 (*)    Calcium 8.7 (*)    Albumin 2.4 (*)    GFR, Estimated 10 (*)    All other components within normal limits  CBC WITH DIFFERENTIAL/PLATELET - Abnormal; Notable for the following components:   RBC 3.34 (*)    Hemoglobin 9.1 (*)    HCT 28.5 (*)    All other components within normal limits  CBG MONITORING, ED - Abnormal; Notable for the following components:   Glucose-Capillary 189 (*)    All other components within normal limits  TROPONIN I (HIGH SENSITIVITY) - Abnormal; Notable for the following components:   Troponin I (High Sensitivity) 33 (*)    All other components within normal limits  PREGNANCY, URINE  TROPONIN I (HIGH SENSITIVITY)    EKG None  Radiology DG Chest 2 View  Result Date: 05/11/2022 CLINICAL DATA:  Shortness of breath and dizziness EXAM: CHEST - 2 VIEW COMPARISON:  Radiographs 04/29/2022 FINDINGS: The heart size and mediastinal contours are within normal limits. Both lungs are clear. The visualized skeletal structures are unremarkable. IMPRESSION: No active cardiopulmonary disease. Electronically Signed   By: Minerva Fester M.D.   On: 05/11/2022 17:53    Procedures Procedures   Medications Ordered in ED Medications  sodium chloride 0.9 % bolus 500 mL (500 mLs Intravenous New Bag/Given 05/11/22 1802)    ED Course/ Medical Decision Making/ A&P                              Medical Decision Making Patient is a 37 year old female, here for syncope/presyncope that occurred when she was running to go get her daughter from the bus, and felt like everything was bright.  No acute neurodeficits on exam, overall well-appearing, complaint is some shortness of breath, exertion.  We will obtain a EKG, for further evaluation.  Additionally hers medications were changed yesterday and hydralazine was 4x the daily dose today.  Amount and/or Complexity of Data Reviewed Labs: ordered.    Details: Mildly elevated troponin 33, potassium 5.2, 5.12 Radiology: ordered.    Details: Chest x-ray clear Discussion of management or test interpretation with external provider(s): Discussed with nephrology, Dr. Ronalee Belts, he recommends obtaining a CT renal, to rule out any kind of bladder obstruction, causing some cardiorenal syndrome.  As well as a bladder scan.  Recommends holding anti-hypertensives tonight, and rechecking labs in the a.m., and if persistently having issues to consult again.  Troponin mildly elevated 33, require second repeat, as well as a CT pending at admission to medicine.  Will need reconfiguration of medications, likely because this caused some hypotension in patient.  Typically is 160s over 90, and was 100/60 today per patient after taking medication.  Risk Decision regarding hospitalization.   Final Clinical Impression(s) / ED Diagnoses Final diagnoses:  Postural dizziness with presyncope  Elevated troponin    Rx / DC Orders ED Discharge Orders     None         Dolphus Jenny, Harley Alto, PA 05/11/22 1900    Jacalyn Lefevre, MD 05/12/22 1616

## 2022-05-11 NOTE — Progress Notes (Signed)
Pt stated she is having tooth pain on R side of face and swelling.  Pt wanted to hold off on CPAP due to the swelling and doesn't want pain to get worse with straps around her face. RN in room and aware

## 2022-05-11 NOTE — Progress Notes (Signed)
Pharmacy Antibiotic Note  Karina [name] is a 37 y.o. female for which pharmacy has been consulted for ampicillin-sulbactam dosing for  tooth abscess . Patient with a history of T2DM, CKD, HTN.  SCr 5.12 WBC 98.8; T 98.8; HR 103; RR 13  Plan: Unasyn 3g q12h Trend WBC, Fever, Renal function F/u cultures, clinical course, WBC De-escalate when able  Height: 5\' 2"  (157.5 cm) Weight: 108 kg (238 lb) IBW/kg (Calculated) : 50.1  Temp (24hrs), Avg:98.8 F (37.1 C), Min:98.8 F (37.1 C), Max:98.8 F (37.1 C)  Recent Labs  Lab 05/11/22 1625  WBC 8.0  CREATININE 5.12*    Estimated Creatinine Clearance: 17.2 mL/min (A) (by C-G formula based on SCr of 5.12 mg/dL (H)).    Allergies  Allergen Reactions   Lisinopril Cough   Egg-Derived Products Nausea And Vomiting   Contrast Media [Iodinated Contrast Media]     Microbiology results: Pending  Thank you for allowing pharmacy to be a part of this patient's care.  Delmar Landau, PharmD, BCPS 05/11/2022 8:51 PM ED Clinical Pharmacist -  [phone]

## 2022-05-11 NOTE — Subjective & Objective (Signed)
Patient was rapidly trying to walk to the bus stop then she became short of breath and dizzy she sat down in the ventricle and then the 1 states her medications has recently been adjusted she had developed hypokalemia in this case of CKD and her Lasix been increased and spironolactone was stopped recently just prior to walking she took 100 mg of hydralazine 25 and carvedilol but her doctor did stop her spironolactone only yesterday Reports her shortness of breath has been progressive over the past few weeks.  No lower extremity edema

## 2022-05-11 NOTE — Assessment & Plan Note (Signed)
Order CPAP qhs

## 2022-05-11 NOTE — Assessment & Plan Note (Addendum)
Severe pain and tenderness over right cheek No fver  Able to open mouht  No drooling  Order unasyn  And maxillofacial Ct Pain been ongoing for the past 3 wks doubt acute surgical indication if CT is concerning will need follow up with oral surgery/ transfer to facility that has oral surgery Pt has no evidence of sepsis or systemic infection

## 2022-05-11 NOTE — Assessment & Plan Note (Signed)
Chronic stable will need nutrition follow-up as an outpatient

## 2022-05-11 NOTE — ED Notes (Signed)
ED TO INPATIENT HANDOFF REPORT  ED Nurse Name and Phone #: Waynette Buttery, RN  S Name/Age/Gender Karina [name] 37 y.o. female Room/Bed: 003C/003C  Code Status   Code Status: Full Code  Home/SNF/Other Home Patient oriented to: self, place, time, and situation Is this baseline? Yes   Triage Complete: Triage complete  Chief Complaint Hypotension [I95.9]  Triage Note Pt states she was speed walking to her daughter bus stop because she thought she was going to be late and became short of breath as well as dizzy. She states that she sat down and called 911. Pt states she is being treated for hyperkalemia with lasix's and spironolactone. Pt states prior to walking she took 100 mg hydralazine and 25 mg carvedilol. Pt did state that her doctor stopped the spironolactone yesterday.   Allergies Allergies  Allergen Reactions   Lisinopril Cough   Egg-Derived Products Nausea And Vomiting   Contrast Media [Iodinated Contrast Media]     Level of Care/Admitting Diagnosis ED Disposition     ED Disposition  Admit   Condition  --   Comment  Hospital Area: [facility] [[id number]]  Level of Care: Progressive [102]  Admit to Progressive based on following criteria: CARDIOVASCULAR & THORACIC of moderate stability with acute coronary syndrome symptoms/low risk myocardial infarction/hypertensive urgency/arrhythmias/heart failure potentially compromising stability and stable post cardiovascular intervention patients.  May place patient in observation at [facility] or [facility] if equivalent level of care is available:: No  Covid Evaluation: Asymptomatic - no recent exposure (last 10 days) testing not required  Diagnosis: Hypotension [161096]  Admitting Physician: Therisa Doyne [[id number]]  Attending Physician: Therisa Doyne [[id number]]          B Medical/Surgery History Past Medical History:  Diagnosis Date   Acute pain of right shoulder 03/13/2019   Allergy     Angio-edema    Asthma    Back pain 12/12/2017   Carpal tunnel syndrome of right wrist 03/13/2019   COVID-19 01/24/2019   Depression 12/27/2012   Diabetes mellitus    Type 2, insulin resistant   Diabetic neuropathy, type II diabetes mellitus (HCC) 05/23/2013   DOE (dyspnea on exertion) 05/21/2019   Onset with covid 19 infection symptoms started 01/16/19  -  05/23/2019   Walked RA  3 laps @ approx 277ft each @ moderat pace  stopped due to end of study,  no limiting sob/ sats 100% at end despite reported sob at rest    Essential hypertension, benign 12/12/2013   GERD (gastroesophageal reflux disease)    History of COVID-19 03/22/2019   Lower extremity edema 08/29/2017   Moderate nonproliferative diabetic retinopathy (HCC) 05/24/2013   Both OS and OD.  Diagnosed by Dr. Altamease Oiler, MD, PhD 05/23/13. Also mild macular edema. Follows with Dr. 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, with long-term current use of insulin (HCC) 12/27/2012        Upper airway cough syndrome 03/22/2019   Past Surgical History:  Procedure Laterality Date   CERVICAL BIOPSY  2004   CESAREAN SECTION N/A 06/02/2012   Procedure: CESAREAN SECTION;  Surgeon: Catalina Antigua, MD;  Location: [facility];  Service: Obstetrics;  Laterality: N/A;   RETINAL DETACHMENT REPAIR W/ SCLERAL BUCKLE LE Left 10/08/2019     A IV Location/Drains/Wounds Patient Lines/Drains/Airways Status     Active Line/Drains/Airways     Name Placement date Placement time Site Days   Peripheral IV 05/11/22 20 G 1" Right Antecubital 05/11/22  1717  Antecubital  less than 1            Intake/Output Last 24 hours  Intake/Output  Summary (Last 24 hours) at 05/11/2022 2043 Last data filed at 05/11/2022 2041 Gross per 24 hour  Intake 500 ml  Output --  Net 500 ml    Labs/Imaging Results for orders placed or performed during the hospital encounter of 05/11/22 (from the past 48 hour(s))  Comprehensive metabolic panel     Status: Abnormal   Collection Time: 05/11/22  4:25 PM  Result Value Ref Range   Sodium 134 (L) 135 - 145 mmol/L   Potassium 5.2 (H) 3.5 - 5.1 mmol/L   Chloride 102 98 - 111 mmol/L   CO2 24 22 - 32 mmol/L   Glucose, Bld 234 (H) 70 - 99 mg/dL    Comment: Glucose reference range applies only to samples taken after fasting for at least 8 hours.   BUN 38 (H) 6 - 20 mg/dL   Creatinine, Ser 6.21 (H) 0.44 - 1.00 mg/dL   Calcium 8.7 (L) 8.9 - 10.3 mg/dL   Total Protein 6.8 6.5 - 8.1 g/dL   Albumin 2.4 (L) 3.5 - 5.0 g/dL   AST 15 15 - 41 U/L   ALT 12 0 - 44 U/L   Alkaline Phosphatase 68 38 - 126 U/L   Total Bilirubin 0.3 0.3 - 1.2 mg/dL   GFR, Estimated 10 (L) >60 mL/min    Comment: (NOTE) Calculated using the CKD-EPI Creatinine Equation (2021)    Anion gap 8 5 - 15    Comment: Performed at [facility], [address] [place], [place] [address]  CBC with Differential     Status: Abnormal   Collection Time: 05/11/22  4:25 PM  Result Value Ref Range   WBC 8.0 4.0 - 10.5 K/uL   RBC 3.34 (L) 3.87 - 5.11 MIL/uL   Hemoglobin 9.1 (L) 12.0 - 15.0 g/dL   HCT 78.4 (L) 69.6 - 29.5 %   MCV 85.3 80.0 - 100.0 fL   MCH 27.2 26.0 - 34.0 pg   MCHC 31.9 30.0 - 36.0 g/dL   RDW 28.4 13.2 - 44.0 %   Platelets 311 150 - 400 K/uL   nRBC 0.0 0.0 - 0.2 %   Neutrophils Relative % 71 %   Neutro Abs 5.8 1.7 - 7.7 K/uL   Lymphocytes Relative 15 %   Lymphs Abs 1.2 0.7 - 4.0 K/uL   Monocytes Relative 7 %   Monocytes Absolute 0.5 0.1 - 1.0 K/uL   Eosinophils Relative 6 %   Eosinophils Absolute 0.5 0.0 - 0.5 K/uL   Basophils Relative 1 %   Basophils Absolute 0.0 0.0 - 0.1 K/uL   Immature Granulocytes 0 %    Abs Immature Granulocytes 0.03 0.00 - 0.07 K/uL    Comment: Performed at [facility], [address], [place], [place] [address]  Troponin I (High Sensitivity)     Status: Abnormal   Collection Time: 05/11/22  4:25 PM  Result Value Ref Range   Troponin I (High Sensitivity) 33 (H) <18 ng/L    Comment: (NOTE) Elevated high sensitivity troponin I (hsTnI) values  and significant  changes across serial measurements may suggest ACS but many other  chronic and acute conditions are known to elevate hsTnI results.  Refer to the "Links" section for chest pain algorithms and additional  guidance. Performed at [facility], [address], [place], [place] [address]   Pregnancy, urine     Status: None   Collection Time: 05/11/22  4:36 PM  Result Value Ref Range   Preg Test, Ur NEGATIVE NEGATIVE    Comment: Performed at [facility], [address] [place], [place] [address]  CBG monitoring, ED     Status: Abnormal   Collection Time: 05/11/22  5:58 PM  Result Value Ref Range   Glucose-Capillary 189 (H) 70 - 99 mg/dL    Comment: Glucose reference range applies only to samples taken after fasting for at least 8 hours.   Comment 1 Notify RN    Comment 2 Document in Chart    CT Renal Stone Study  Result Date: 05/11/2022 CLINICAL DATA:  Evaluate for bowel obstruction EXAM: CT ABDOMEN AND PELVIS WITHOUT CONTRAST TECHNIQUE: Multidetector CT imaging of the abdomen and pelvis was performed following the standard protocol without IV contrast. RADIATION DOSE REDUCTION: This exam was performed according to the departmental dose-optimization program which includes automated exposure control, adjustment of the mA and/or kV according to patient size and/or use of iterative reconstruction technique. COMPARISON:  CT abdomen and pelvis 11/19/2020 FINDINGS: Lower chest: No acute abnormality. Hepatobiliary: No focal liver abnormality is seen. No gallstones, gallbladder wall thickening, or biliary  dilatation. Pancreas: Unremarkable. No pancreatic ductal dilatation or surrounding inflammatory changes. Spleen: Normal in size without focal abnormality. Adrenals/Urinary Tract: Adrenal glands are unremarkable. Kidneys are normal, without renal calculi, focal lesion, or hydronephrosis. Bladder is unremarkable. Stomach/Bowel: Stomach is within normal limits. Appendix appears normal. No evidence of bowel wall thickening, distention, or inflammatory changes. Vascular/Lymphatic: No significant vascular findings are present. No enlarged abdominal or pelvic lymph nodes. Reproductive: Uterus and bilateral adnexa are unremarkable. Other: No abdominal wall hernia or abnormality. No abdominopelvic ascites. Musculoskeletal: No acute or significant osseous findings. IMPRESSION: 1. No CT evidence of acute abdominal/pelvic process. Electronically Signed   By: Darliss Cheney M.D.   On: 05/11/2022 19:18   DG Chest 2 View  Result Date: 05/11/2022 CLINICAL DATA:  Shortness of breath and dizziness EXAM: CHEST - 2 VIEW COMPARISON:  Radiographs 04/29/2022 FINDINGS: The heart size and mediastinal contours are within normal limits. Both lungs are clear. The visualized skeletal structures are unremarkable. IMPRESSION: No active cardiopulmonary disease. Electronically Signed   By: Minerva Fester M.D.   On: 05/11/2022 17:53    Pending Labs Unresulted Labs (From admission, onward)     Start     Ordered   05/12/22 0500  Prealbumin  Tomorrow morning,   R        05/11/22 1938   05/12/22 0500  Vitamin B12  (Anemia Panel (PNL))  Tomorrow morning,   R        05/11/22 1938   05/12/22 0500  Folate  (Anemia Panel (PNL))  Tomorrow morning,   R        05/11/22 1938   05/11/22 2300  Basic metabolic panel  Once,   R        05/11/22 1939   05/11/22 2040  CBC with Differential/Platelet  Once,   AD        05/11/22 2040   05/11/22 2040  Reticulocytes  Once,   AD  05/11/22 2040   05/11/22 2034  Hemoglobin A1c  Once,   R        Comments: To assess prior glycemic control    05/11/22 2033   05/11/22 1939  CK  Add-on,   AD        05/11/22 1938   05/11/22 1939  Hepatic function panel  Add-on,   AD       Question:  Release to patient  Answer:  Immediate   05/11/22 1938   05/11/22 1939  CBC with Differential/Platelet  Add-on,   AD       Question:  Release to patient  Answer:  Immediate   05/11/22 1938   05/11/22 1939  Lactic acid, plasma  STAT Now then every 3 hours,   R (with STAT occurrences)     Question:  Release to patient  Answer:  Immediate   05/11/22 1938   05/11/22 1939  TSH  Add-on,   AD        05/11/22 1938   05/11/22 1939  Urinalysis, Complete w Microscopic -Urine, Clean Catch  Once,   R       Question Answer Comment  Release to patient Immediate   Specimen Source Urine, Clean Catch      05/11/22 1938   05/11/22 1939  Iron and TIBC  (Anemia Panel (PNL))  Add-on,   AD        05/11/22 1938   05/11/22 1939  Ferritin  (Anemia Panel (PNL))  Add-on,   AD        05/11/22 1938   Signed and Held  HIV Antibody (routine testing w rflx)  (HIV Antibody (Routine testing w reflex) panel)  Once,   R        Signed and Held   Signed and Held  Magnesium  Tomorrow morning,   R        Signed and Held   Signed and Held  Phosphorus  Tomorrow morning,   R        Signed and Held   Signed and Held  Comprehensive metabolic panel  Tomorrow morning,   R       Question:  Release to patient  Answer:  Immediate   Signed and Held   Signed and Held  CBC  Tomorrow morning,   R       Question:  Release to patient  Answer:  Immediate   Signed and Held            Vitals/Pain Today's Vitals   05/11/22 1552 05/11/22 1557 05/11/22 1700 05/11/22 1730  BP: 122/73  122/71 128/77  Pulse: (!) 103  (!) 102 (!) 102  Resp: 13  14 16   Temp: 98.8 F (37.1 C)     TempSrc: Oral     SpO2: 99%  100% 99%  Weight:  238 lb (108 kg)    Height:  5\' 2"  (1.575 m)    PainSc: 0-No pain       Isolation Precautions No active  isolations  Medications Medications  insulin aspart (novoLOG) injection 0-6 Units (has no administration in time range)  sodium chloride 0.9 % bolus 500 mL (0 mLs Intravenous Stopped 05/11/22 2041)    Mobility walks     Focused Assessments Pt from home after she was walking and became dizzy and short of breath. Pt with chronic kidney issues. Pt continues to be dizzy. Alert and oriented.   R Recommendations: See Admitting Provider Note  Report given to:  Additional Notes:

## 2022-05-11 NOTE — Assessment & Plan Note (Signed)
Due to his p.o. intake and dehydration will hold home medications appreciate nephrology consult

## 2022-05-11 NOTE — Assessment & Plan Note (Addendum)
Order sliding scale  - Order Sensitive  SSI   - continue home insulin but decreased to  35units,  -  check TSH and HgA1C  - Hold ozempic

## 2022-05-11 NOTE — Assessment & Plan Note (Signed)
Cont albuterol prn Check resp panel

## 2022-05-12 ENCOUNTER — Observation Stay (HOSPITAL_COMMUNITY): Payer: Medicaid Other

## 2022-05-12 DIAGNOSIS — R7989 Other specified abnormal findings of blood chemistry: Secondary | ICD-10-CM

## 2022-05-12 DIAGNOSIS — I10 Essential (primary) hypertension: Secondary | ICD-10-CM | POA: Diagnosis not present

## 2022-05-12 DIAGNOSIS — G4733 Obstructive sleep apnea (adult) (pediatric): Secondary | ICD-10-CM | POA: Diagnosis present

## 2022-05-12 DIAGNOSIS — Z79899 Other long term (current) drug therapy: Secondary | ICD-10-CM | POA: Diagnosis not present

## 2022-05-12 DIAGNOSIS — R55 Syncope and collapse: Secondary | ICD-10-CM

## 2022-05-12 DIAGNOSIS — D631 Anemia in chronic kidney disease: Secondary | ICD-10-CM | POA: Diagnosis present

## 2022-05-12 DIAGNOSIS — E860 Dehydration: Secondary | ICD-10-CM | POA: Diagnosis present

## 2022-05-12 DIAGNOSIS — Z6841 Body Mass Index (BMI) 40.0 and over, adult: Secondary | ICD-10-CM | POA: Diagnosis not present

## 2022-05-12 DIAGNOSIS — Z800 Family history of malignant neoplasm of digestive organs: Secondary | ICD-10-CM | POA: Diagnosis not present

## 2022-05-12 DIAGNOSIS — E1142 Type 2 diabetes mellitus with diabetic polyneuropathy: Secondary | ICD-10-CM | POA: Diagnosis present

## 2022-05-12 DIAGNOSIS — R296 Repeated falls: Secondary | ICD-10-CM | POA: Diagnosis present

## 2022-05-12 DIAGNOSIS — E8809 Other disorders of plasma-protein metabolism, not elsewhere classified: Secondary | ICD-10-CM | POA: Diagnosis present

## 2022-05-12 DIAGNOSIS — E113313 Type 2 diabetes mellitus with moderate nonproliferative diabetic retinopathy with macular edema, bilateral: Secondary | ICD-10-CM | POA: Diagnosis present

## 2022-05-12 DIAGNOSIS — E861 Hypovolemia: Secondary | ICD-10-CM | POA: Diagnosis not present

## 2022-05-12 DIAGNOSIS — E1122 Type 2 diabetes mellitus with diabetic chronic kidney disease: Secondary | ICD-10-CM | POA: Diagnosis present

## 2022-05-12 DIAGNOSIS — Z794 Long term (current) use of insulin: Secondary | ICD-10-CM | POA: Diagnosis not present

## 2022-05-12 DIAGNOSIS — Z8616 Personal history of COVID-19: Secondary | ICD-10-CM | POA: Diagnosis not present

## 2022-05-12 DIAGNOSIS — R42 Dizziness and giddiness: Secondary | ICD-10-CM | POA: Diagnosis not present

## 2022-05-12 DIAGNOSIS — E875 Hyperkalemia: Secondary | ICD-10-CM | POA: Diagnosis present

## 2022-05-12 DIAGNOSIS — I959 Hypotension, unspecified: Secondary | ICD-10-CM | POA: Diagnosis present

## 2022-05-12 DIAGNOSIS — N185 Chronic kidney disease, stage 5: Secondary | ICD-10-CM | POA: Diagnosis present

## 2022-05-12 DIAGNOSIS — J45909 Unspecified asthma, uncomplicated: Secondary | ICD-10-CM | POA: Diagnosis present

## 2022-05-12 DIAGNOSIS — E872 Acidosis, unspecified: Secondary | ICD-10-CM | POA: Diagnosis not present

## 2022-05-12 DIAGNOSIS — I3139 Other pericardial effusion (noninflammatory): Secondary | ICD-10-CM | POA: Diagnosis present

## 2022-05-12 DIAGNOSIS — B9781 Human metapneumovirus as the cause of diseases classified elsewhere: Secondary | ICD-10-CM

## 2022-05-12 DIAGNOSIS — I120 Hypertensive chronic kidney disease with stage 5 chronic kidney disease or end stage renal disease: Secondary | ICD-10-CM | POA: Diagnosis present

## 2022-05-12 DIAGNOSIS — I9589 Other hypotension: Secondary | ICD-10-CM | POA: Diagnosis not present

## 2022-05-12 DIAGNOSIS — N250 Renal osteodystrophy: Secondary | ICD-10-CM | POA: Diagnosis present

## 2022-05-12 DIAGNOSIS — K047 Periapical abscess without sinus: Secondary | ICD-10-CM | POA: Diagnosis present

## 2022-05-12 DIAGNOSIS — N184 Chronic kidney disease, stage 4 (severe): Secondary | ICD-10-CM | POA: Diagnosis not present

## 2022-05-12 DIAGNOSIS — Z1152 Encounter for screening for COVID-19: Secondary | ICD-10-CM | POA: Diagnosis not present

## 2022-05-12 DIAGNOSIS — J208 Acute bronchitis due to other specified organisms: Secondary | ICD-10-CM

## 2022-05-12 LAB — COMPREHENSIVE METABOLIC PANEL
ALT: 11 U/L (ref 0–44)
AST: 11 U/L — ABNORMAL LOW (ref 15–41)
Albumin: 2.4 g/dL — ABNORMAL LOW (ref 3.5–5.0)
Alkaline Phosphatase: 64 U/L (ref 38–126)
Anion gap: 12 (ref 5–15)
BUN: 40 mg/dL — ABNORMAL HIGH (ref 6–20)
CO2: 20 mmol/L — ABNORMAL LOW (ref 22–32)
Calcium: 8.3 mg/dL — ABNORMAL LOW (ref 8.9–10.3)
Chloride: 99 mmol/L (ref 98–111)
Creatinine, Ser: 4.79 mg/dL — ABNORMAL HIGH (ref 0.44–1.00)
GFR, Estimated: 11 mL/min — ABNORMAL LOW (ref >60)
Glucose, Bld: 224 mg/dL — ABNORMAL HIGH (ref 70–99)
Potassium: 4.1 mmol/L (ref 3.5–5.1)
Sodium: 131 mmol/L — ABNORMAL LOW (ref 135–145)
Total Bilirubin: 0.3 mg/dL (ref 0.3–1.2)
Total Protein: 6.2 g/dL — ABNORMAL LOW (ref 6.5–8.1)

## 2022-05-12 LAB — FERRITIN: Ferritin: 42 ng/mL (ref 11–307)

## 2022-05-12 LAB — CBC
HCT: 23.2 % — ABNORMAL LOW (ref 36.0–46.0)
Hemoglobin: 7.5 g/dL — ABNORMAL LOW (ref 12.0–15.0)
MCH: 27 pg (ref 26.0–34.0)
MCHC: 32.3 g/dL (ref 30.0–36.0)
MCV: 83.5 fL (ref 80.0–100.0)
Platelets: 305 10*3/uL (ref 150–400)
RBC: 2.78 MIL/uL — ABNORMAL LOW (ref 3.87–5.11)
RDW: 13.6 % (ref 11.5–15.5)
WBC: 7.1 10*3/uL (ref 4.0–10.5)
nRBC: 0 % (ref 0.0–0.2)

## 2022-05-12 LAB — GLUCOSE, CAPILLARY
Glucose-Capillary: 136 mg/dL — ABNORMAL HIGH (ref 70–99)
Glucose-Capillary: 175 mg/dL — ABNORMAL HIGH (ref 70–99)
Glucose-Capillary: 178 mg/dL — ABNORMAL HIGH (ref 70–99)
Glucose-Capillary: 179 mg/dL — ABNORMAL HIGH (ref 70–99)
Glucose-Capillary: 181 mg/dL — ABNORMAL HIGH (ref 70–99)
Glucose-Capillary: 225 mg/dL — ABNORMAL HIGH (ref 70–99)

## 2022-05-12 LAB — RESPIRATORY PANEL BY PCR

## 2022-05-12 LAB — URINALYSIS, COMPLETE (UACMP) WITH MICROSCOPIC
Bacteria, UA: NONE SEEN
Bilirubin Urine: NEGATIVE
Glucose, UA: 50 mg/dL — AB
Hgb urine dipstick: NEGATIVE
Ketones, ur: NEGATIVE mg/dL
Leukocytes,Ua: NEGATIVE
Nitrite: NEGATIVE
Protein, ur: >=300 mg/dL — AB
Specific Gravity, Urine: 1.008 (ref 1.005–1.030)
pH: 7 (ref 5.0–8.0)

## 2022-05-12 LAB — VITAMIN B12: Vitamin B-12: 384 pg/mL (ref 180–914)

## 2022-05-12 LAB — IRON AND TIBC
Iron: 37 ug/dL (ref 28–170)
Saturation Ratios: 14 % (ref 10.4–31.8)
TIBC: 259 ug/dL (ref 250–450)
UIBC: 222 ug/dL

## 2022-05-12 LAB — ECHOCARDIOGRAM COMPLETE
AR max vel: 2.62 cm2
AV Peak grad: 10.4 mmHg
Ao pk vel: 1.61 m/s
Area-P 1/2: 4.06 cm2
Height: 62 in
S' Lateral: 2.7 cm
Weight: 3812.8 oz

## 2022-05-12 LAB — SARS CORONAVIRUS 2 BY RT PCR: SARS Coronavirus 2 by RT PCR: NEGATIVE

## 2022-05-12 LAB — PREALBUMIN: Prealbumin: 26 mg/dL (ref 18–38)

## 2022-05-12 LAB — MAGNESIUM: Magnesium: 1.6 mg/dL — ABNORMAL LOW (ref 1.7–2.4)

## 2022-05-12 LAB — PHOSPHORUS: Phosphorus: 4.7 mg/dL — ABNORMAL HIGH (ref 2.5–4.6)

## 2022-05-12 LAB — FOLATE: Folate: 8.1 ng/mL (ref >5.9)

## 2022-05-12 LAB — HIV ANTIBODY (ROUTINE TESTING W REFLEX): HIV Screen 4th Generation wRfx: NONREACTIVE

## 2022-05-12 MED ORDER — SODIUM CHLORIDE 0.9 % IV SOLN
250.0000 mg | Freq: Once | INTRAVENOUS | Status: AC
  Administered 2022-05-12: 250 mg via INTRAVENOUS
  Filled 2022-05-12: qty 20

## 2022-05-12 MED ORDER — LIVING WELL WITH DIABETES BOOK
Freq: Once | Status: AC
  Filled 2022-05-12: qty 1

## 2022-05-12 MED ORDER — FUROSEMIDE 40 MG PO TABS
40.0000 mg | ORAL_TABLET | Freq: Every day | ORAL | Status: DC
  Administered 2022-05-13 – 2022-05-15 (×3): 40 mg via ORAL
  Filled 2022-05-12 (×3): qty 1

## 2022-05-12 MED ORDER — SODIUM BICARBONATE 650 MG PO TABS
650.0000 mg | ORAL_TABLET | Freq: Two times a day (BID) | ORAL | Status: DC
  Administered 2022-05-12 – 2022-05-15 (×6): 650 mg via ORAL
  Filled 2022-05-12 (×6): qty 1

## 2022-05-12 NOTE — Progress Notes (Signed)
Echocardiogram 2D Echocardiogram has been performed.  Karina [name] 05/12/2022, 10:44 AM

## 2022-05-12 NOTE — Consult Note (Addendum)
Reason for Consult: Renal failure Referring Physician: Dr. 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 Signe Colt. Patient was upset that Dr. Signe Colt wanted her to restart the spironolactone due to episodes of hyperkalemia and has since sought a 2nd opinion from a nephrologist with [facility] Dr. Janit Pagan who stopped the spironolactone.  - Potassium is only in the low 4's and she should be started on the spironolactone for it's antiproteinuric effect at a low dose as tolerated with close follow up of the potassium. But will need to continue dialogue with the patient as if she's planning on continuing to see Dr. Janit Pagan it would not be advisable to keep changing medications. - No e/o obstruction or stones on CT A/P stone protocol without contrast. - Would discontinue hydration as she appears to be euvolemic; restart lasix 40mg  daily in the AM. Counseled her as she was taking the Lasix in the evening resulting in frequent visits to the restroom overnight. - No indication for dialysis but she is certainly headed in that direction in the non too distant future based on her progression of renal decline. A1c still elevated otherwise she should would have been referred for a preemptive renal transplant; may want to refer regardless for possible KP. - Start HCO3 650mg  1 tab BID  -Maintain MAP>65 for optimal renal perfusion.  - Avoid nephrotoxic medications including NSAIDs and iodinated intravenous contrast exposure unless the latter is absolutely indicated.   - Preferred narcotic agents for pain control are hydromorphone, fentanyl, and methadone. Morphine should not be used.  - Avoid Baclofen and avoid oral sodium phosphate and magnesium citrate based laxatives / bowel preps.  - Continue strict Input and Output monitoring. Will monitor the patient closely with you and intervene or adjust therapy as  indicated by changes in clinical status/labs  Anemia: iron deficient and will dose Nulecit 250mg  IV x2 followed by Aranesp . Transfuse as needed. Renal osteodystrophy: within range and no binder for now; will need further dietary education for high phos and potassium foods to avoid. DM: poorly controlled with recent A1c of 10 which was an improvement from prior results with evidence of end organ damage. Managed by primary.  Tooth infection: CT no e/o abscess due for an extraction on the right side OSA on CPAP HTN: actually presented with hypotension after escalation of hydralazine; will monitor BP overnight and if needed will restart on 25 mg BID tomorrow uptitrating as needed.   HPI: Karina [name] is an 37 y.o. 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 Signe Colt presenting with a presyncopal episode when when was walking rapidly, became short of breath, lightheaded and had to sit down because she felt as if she were about to pass out.Of note hydralazine was recently escalated from 25mg  BID to 100mg  twice daily and spironolactone was discontinued. She has also had worsening shortness of breath over the past few weeks but denies any swelling in her lower extremities. She has had decreased energy levels but denies any chest pain, dark tarry stools, abdominal pain and has not been eating much because of ozempic. She has had a nonproductive cough for the past month and was prescribed amoxicillin as well as a 5 day course of prednisone. She has also been taking advil to help with oral pain as she was due for a tooth extraction on the day of presentation.  Diabetes has been very poorly controlled with A1c in the 12-13 range but better controlled along with weight loss with Ozempic (A1c down to 10.3 10/21/21). Patient was recently taken off spironolactone because of hyperkalemia 6.2 which improved to 5.1 when checked at [facility] ED. Last seen by Dr. 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 07/2021:Cr 3.10 (eGFR 19%), 10/2021: 4.13 (eGFR 13%), 12/2021 4.32. 4/19 5.35, UPC 6, TSAT 20%, Ferritin 67.   Creatinine this hospitalization has been in the 4.8-4.9 range with a potassium of 4.1 and HCO3 20.   ROS Pertinent items are noted in HPI.  Chemistry and CBC: Creat  Date/Time Value Ref Range Status  03/21/2015 12:30 PM 0.93 0.50 - 1.10 mg/dL Final  16/10/9602 54:09 AM 0.72 0.50 - 1.10 mg/dL Final  81/19/1478 29:56 PM 0.94 0.50 - 1.10 mg/dL Final  21/30/8657 84:69 PM 1.00 0.50 - 1.10 mg/dL Final   Creatinine, Ser  Date/Time Value Ref Range Status  05/12/2022 01:48 AM 4.79 (H) 0.44 - 1.00 mg/dL Final  62/95/2841 32:44 PM 4.88 (H) 0.44 - 1.00 mg/dL Final  01/13/7251 66:44 PM 5.12 (H) 0.44 - 1.00 mg/dL Final  03/47/4259 56:38 AM 4.95 (H) 0.44 - 1.00 mg/dL Final  75/64/3329 51:88 PM 2.28 (H) 0.44 - 1.00 mg/dL Final  41/66/0630 16:01 PM 1.79 (H) 0.40 - 1.20 mg/dL Final  09/32/3557 32:20 AM 1.69 (H) 0.44 - 1.00 mg/dL Final  25/42/7062 37:62 PM 1.59 (H) 0.44 - 1.00 mg/dL Final  83/15/1761 60:73 PM 1.35 (H) 0.57 - 1.00 mg/dL Final  71/06/2692 85:46 PM 1.49 (H) 0.57 - 1.00 mg/dL Final  27/03/5007 38:18 AM 1.41 (H) 0.40 - 1.20 mg/dL Final  29/93/7169 67:89 PM 1.13 (H) 0.57 - 1.00 mg/dL Final  38/10/1749 02:58 AM 1.39 (H) 0.57 - 1.00 mg/dL Final  52/77/8242 35:36 PM 0.91 0.57 - 1.00 mg/dL Final  14/43/1540 08:67 PM 1.11 (H) 0.44 - 1.00 mg/dL Final  61/95/0932 67:12 PM 0.70 0.44 - 1.00 mg/dL Final  45/80/9983 38:25 AM 0.57 0.50 - 1.10 mg/dL Final  05/39/7673 41:93 PM 0.91 0.50 - 1.10 mg/dL Final  79/02/4095 35:32 AM 0.86 0.50 - 1.10 mg/dL Final   Recent Labs  Lab 05/11/22 1625 05/11/22 2252 05/12/22 0148  NA 134* 133* 131*  K 5.2* 4.2 4.1  CL 102 101 99  CO2 24 22 20*  GLUCOSE 234* 170* 224*  BUN 38* 41* 40*  CREATININE 5.12* 4.88* 4.79*  CALCIUM 8.7* 8.6* 8.3*  PHOS  --   --  4.7*   Recent Labs   Lab 05/11/22 1625 05/11/22 2252 05/12/22 0148  WBC 8.0 6.9 7.1  NEUTROABS 5.8 4.0  --   HGB 9.1* 8.7* 7.5*  HCT 28.5* 25.9* 23.2*  MCV 85.3 82.5 83.5  PLT 311 313 305   Liver Function Tests: Recent Labs  Lab 05/11/22 1625 05/11/22 2012 05/12/22 0148  AST 15 12* 11*  ALT 12 11 11   ALKPHOS 68 68 64  BILITOT 0.3 0.3 0.3  PROT 6.8 6.5 6.2*  ALBUMIN 2.4* 2.5* 2.4*   No results for input(s): "LIPASE", "AMYLASE" in the last 168 hours. No results for input(s): "AMMONIA" in the last 168 hours. Cardiac Enzymes: Recent Labs  Lab 05/11/22 2012  CKTOTAL 197   Iron Studies:  Recent Labs    05/11/22 2252  IRON 37  TIBC 259  FERRITIN 42   PT/INR: @LABRCNTIP (inr:5)  Xrays/Other Studies: ) Results for orders placed or performed during the hospital encounter of 05/11/22 (from  the past 48 hour(s))  Comprehensive metabolic panel     Status: Abnormal   Collection Time: 05/11/22  4:25 PM  Result Value Ref Range   Sodium 134 (L) 135 - 145 mmol/L   Potassium 5.2 (H) 3.5 - 5.1 mmol/L   Chloride 102 98 - 111 mmol/L   CO2 24 22 - 32 mmol/L   Glucose, Bld 234 (H) 70 - 99 mg/dL    Comment: Glucose reference range applies only to samples taken after fasting for at least 8 hours.   BUN 38 (H) 6 - 20 mg/dL   Creatinine, Ser 1.61 (H) 0.44 - 1.00 mg/dL   Calcium 8.7 (L) 8.9 - 10.3 mg/dL   Total Protein 6.8 6.5 - 8.1 g/dL   Albumin 2.4 (L) 3.5 - 5.0 g/dL   AST 15 15 - 41 U/L   ALT 12 0 - 44 U/L   Alkaline Phosphatase 68 38 - 126 U/L   Total Bilirubin 0.3 0.3 - 1.2 mg/dL   GFR, Estimated 10 (L) >60 mL/min    Comment: (NOTE) Calculated using the CKD-EPI Creatinine Equation (2021)    Anion gap 8 5 - 15    Comment: Performed at [facility], [address] [place], [place] [address]  CBC with Differential     Status: Abnormal   Collection Time: 05/11/22  4:25 PM  Result Value Ref Range   WBC 8.0 4.0 - 10.5 K/uL   RBC 3.34 (L) 3.87 - 5.11 MIL/uL   Hemoglobin 9.1 (L) 12.0 -  15.0 g/dL   HCT 54.0 (L) 98.1 - 19.1 %   MCV 85.3 80.0 - 100.0 fL   MCH 27.2 26.0 - 34.0 pg   MCHC 31.9 30.0 - 36.0 g/dL   RDW 47.8 29.5 - 62.1 %   Platelets 311 150 - 400 K/uL   nRBC 0.0 0.0 - 0.2 %   Neutrophils Relative % 71 %   Neutro Abs 5.8 1.7 - 7.7 K/uL   Lymphocytes Relative 15 %   Lymphs Abs 1.2 0.7 - 4.0 K/uL   Monocytes Relative 7 %   Monocytes Absolute 0.5 0.1 - 1.0 K/uL   Eosinophils Relative 6 %   Eosinophils Absolute 0.5 0.0 - 0.5 K/uL   Basophils Relative 1 %   Basophils Absolute 0.0 0.0 - 0.1 K/uL   Immature Granulocytes 0 %   Abs Immature Granulocytes 0.03 0.00 - 0.07 K/uL    Comment: Performed at [facility], [address], [place], [place] [address]  Troponin I (High Sensitivity)     Status: Abnormal   Collection Time: 05/11/22  4:25 PM  Result Value Ref Range   Troponin I (High Sensitivity) 33 (H) <18 ng/L    Comment: (NOTE) Elevated high sensitivity troponin I (hsTnI) values and significant  changes across serial measurements may suggest ACS but many other  chronic and acute conditions are known to elevate hsTnI results.  Refer to the "Links" section for chest pain algorithms and additional  guidance. Performed at [facility], [address] [place], [place] [address]   Pregnancy, urine     Status: None   Collection Time: 05/11/22  4:36 PM  Result Value Ref Range   Preg Test, Ur NEGATIVE NEGATIVE    Comment: Performed at [facility], [address], [place], [place] [address]  CBG monitoring, ED     Status: Abnormal   Collection Time: 05/11/22  5:58 PM  Result Value Ref Range  Glucose-Capillary 189 (H) 70 - 99 mg/dL    Comment: Glucose reference range applies only to samples taken after fasting for at least 8 hours.   Comment 1 Notify RN    Comment 2 Document in Chart   Troponin I (High Sensitivity)     Status: Abnormal   Collection Time: 05/11/22  8:12 PM  Result Value Ref Range   Troponin I (High Sensitivity) 37 (H) <18  ng/L    Comment: (NOTE) Elevated high sensitivity troponin I (hsTnI) values and significant  changes across serial measurements may suggest ACS but many other  chronic and acute conditions are known to elevate hsTnI results.  Refer to the "Links" section for chest pain algorithms and additional  guidance. Performed at [facility], [address], [place], [place] [address]   CK     Status: None   Collection Time: 05/11/22  8:12 PM  Result Value Ref Range   Total CK 197 38 - 234 U/L    Comment: Performed at [facility], [address], [place], [place] [address]  Hepatic function panel     Status: Abnormal   Collection Time: 05/11/22  8:12 PM  Result Value Ref Range   Total Protein 6.5 6.5 - 8.1 g/dL   Albumin 2.5 (L) 3.5 - 5.0 g/dL   AST 12 (L) 15 - 41 U/L   ALT 11 0 - 44 U/L   Alkaline Phosphatase 68 38 - 126 U/L   Total Bilirubin 0.3 0.3 - 1.2 mg/dL   Bilirubin, Direct <0.9 0.0 - 0.2 mg/dL   Indirect Bilirubin NOT CALCULATED 0.3 - 0.9 mg/dL    Comment: Performed at [facility], [address] [place], [place] [address]  Lactic acid, plasma     Status: None   Collection Time: 05/11/22  8:12 PM  Result Value Ref Range   Lactic Acid, Venous 0.7 0.5 - 1.9 mmol/L    Comment: Performed at [facility], [address], [place], [place] [address]  TSH     Status: None   Collection Time: 05/11/22  8:12 PM  Result Value Ref Range   TSH 1.774 0.350 - 4.500 uIU/mL    Comment: Performed by a 3rd Generation assay with a functional sensitivity of <=0.01 uIU/mL. Performed at [facility], [address] [place], [place] [address]   Glucose, capillary     Status: Abnormal   Collection Time: 05/11/22 10:20 PM  Result Value Ref Range   Glucose-Capillary 171 (H) 70 - 99 mg/dL    Comment: Glucose reference range applies only to samples taken after fasting for at least 8 hours.   Comment 1 Notify RN    Comment 2 Document in Chart   Lactic acid, plasma      Status: None   Collection Time: 05/11/22 10:52 PM  Result Value Ref Range   Lactic Acid, Venous 0.6 0.5 - 1.9 mmol/L    Comment: Performed at [facility], [address], [place], [place] [address]  Iron and TIBC     Status: None   Collection Time: 05/11/22 10:52 PM  Result Value Ref Range   Iron 37 28 - 170 ug/dL   TIBC 846 962 - 952 ug/dL   Saturation Ratios 14 10.4 - 31.8 %   UIBC 222 ug/dL    Comment: Performed at [facility], [address] [place], [place] [address]  Ferritin     Status: None  Collection Time: 05/11/22 10:52 PM  Result Value Ref Range   Ferritin 42 11 - 307 ng/mL    Comment: Performed at [facility], [address], [place], [place] [address]  Basic metabolic panel     Status: Abnormal   Collection Time: 05/11/22 10:52 PM  Result Value Ref Range   Sodium 133 (L) 135 - 145 mmol/L   Potassium 4.2 3.5 - 5.1 mmol/L   Chloride 101 98 - 111 mmol/L   CO2 22 22 - 32 mmol/L   Glucose, Bld 170 (H) 70 - 99 mg/dL    Comment: Glucose reference range applies only to samples taken after fasting for at least 8 hours.   BUN 41 (H) 6 - 20 mg/dL   Creatinine, Ser 6.04 (H) 0.44 - 1.00 mg/dL   Calcium 8.6 (L) 8.9 - 10.3 mg/dL   GFR, Estimated 11 (L) >60 mL/min    Comment: (NOTE) Calculated using the CKD-EPI Creatinine Equation (2021)    Anion gap 10 5 - 15    Comment: Performed at [facility], [address] [place], [place] [address]  CBC with Differential/Platelet     Status: Abnormal   Collection Time: 05/11/22 10:52 PM  Result Value Ref Range   WBC 6.9 4.0 - 10.5 K/uL   RBC 3.14 (L) 3.87 - 5.11 MIL/uL   Hemoglobin 8.7 (L) 12.0 - 15.0 g/dL   HCT 11.9 (L) 14.7 - 82.9 %   MCV 82.5 80.0 - 100.0 fL   MCH 27.7 26.0 - 34.0 pg   MCHC 33.6 30.0 - 36.0 g/dL   RDW 56.2 13.0 - 86.5 %   Platelets 313 150 - 400 K/uL   nRBC 0.0 0.0 - 0.2 %   Neutrophils Relative % 57 %   Neutro Abs 4.0 1.7 - 7.7 K/uL   Lymphocytes Relative 30 %   Lymphs Abs  2.1 0.7 - 4.0 K/uL   Monocytes Relative 7 %   Monocytes Absolute 0.5 0.1 - 1.0 K/uL   Eosinophils Relative 6 %   Eosinophils Absolute 0.4 0.0 - 0.5 K/uL   Basophils Relative 0 %   Basophils Absolute 0.0 0.0 - 0.1 K/uL   Immature Granulocytes 0 %   Abs Immature Granulocytes 0.02 0.00 - 0.07 K/uL    Comment: Performed at [facility], [address] [place], [place] [address]  Reticulocytes     Status: Abnormal   Collection Time: 05/11/22 10:52 PM  Result Value Ref Range   Retic Ct Pct 1.5 0.4 - 3.1 %   RBC. 3.13 (L) 3.87 - 5.11 MIL/uL   Retic Count, Absolute 47.9 19.0 - 186.0 K/uL   Immature Retic Fract 10.5 2.3 - 15.9 %    Comment: Performed at [facility], [address] [place], [place] [address]  Hemoglobin A1c     Status: Abnormal   Collection Time: 05/11/22 10:52 PM  Result Value Ref Range   Hgb A1c MFr Bld 10.2 (H) 4.8 - 5.6 %    Comment: (NOTE) Pre diabetes:          5.7%-6.4%  Diabetes:              >6.4%  Glycemic control for   <7.0% adults with diabetes    Mean Plasma Glucose 246.04 mg/dL    Comment: Performed at [facility], [address] [place], [place] [address]  Troponin I (High Sensitivity)     Status: Abnormal   Collection Time:  05/11/22 10:52 PM  Result Value Ref Range   Troponin I (High Sensitivity) 37 (H) <18 ng/L    Comment: (NOTE) Elevated high sensitivity troponin I (hsTnI) values and significant  changes across serial measurements may suggest ACS but many other  chronic and acute conditions are known to elevate hsTnI results.  Refer to the "Links" section for chest pain algorithms and additional  guidance. Performed at [facility], [address], [place], [place] [address]   HIV Antibody (routine testing w rflx)     Status: None   Collection Time: 05/11/22 10:52 PM  Result Value Ref Range   HIV Screen 4th Generation wRfx Non Reactive Non Reactive    Comment: Performed at [facility], [address],  [place], [place] [address]  SARS Coronavirus 2 by RT PCR (hospital order, performed in [facility] hospital lab) *cepheid single result test* Anterior Nasal Swab     Status: None   Collection Time: 05/11/22 11:05 PM   Specimen: Anterior Nasal Swab  Result Value Ref Range   SARS Coronavirus 2 by RT PCR NEGATIVE NEGATIVE    Comment: Performed at [facility], [address], [place], [place] [address]  Respiratory (~20 pathogens) panel by PCR     Status: Abnormal   Collection Time: 05/11/22 11:05 PM   Specimen: Anterior Nasal Swab; Respiratory  Result Value Ref Range   Adenovirus NOT DETECTED NOT DETECTED   Coronavirus 229E NOT DETECTED NOT DETECTED    Comment: (NOTE) The Coronavirus on the Respiratory Panel, DOES NOT test for the novel  Coronavirus (2019 nCoV)    Coronavirus HKU1 NOT DETECTED NOT DETECTED   Coronavirus NL63 NOT DETECTED NOT DETECTED   Coronavirus OC43 NOT DETECTED NOT DETECTED   Metapneumovirus DETECTED (A) NOT DETECTED   Rhinovirus / Enterovirus NOT DETECTED NOT DETECTED   Influenza A NOT DETECTED NOT DETECTED   Influenza B NOT DETECTED NOT DETECTED   Parainfluenza Virus 1 NOT DETECTED NOT DETECTED   Parainfluenza Virus 2 NOT DETECTED NOT DETECTED   Parainfluenza Virus 3 NOT DETECTED NOT DETECTED   Parainfluenza Virus 4 NOT DETECTED NOT DETECTED   Respiratory Syncytial Virus NOT DETECTED NOT DETECTED   Bordetella pertussis NOT DETECTED NOT DETECTED   Bordetella Parapertussis NOT DETECTED NOT DETECTED   Chlamydophila pneumoniae NOT DETECTED NOT DETECTED   Mycoplasma pneumoniae NOT DETECTED NOT DETECTED    Comment: Performed at [facility], [address], [place], [place] [address]  Glucose, capillary     Status: Abnormal   Collection Time: 05/12/22 12:17 AM  Result Value Ref Range   Glucose-Capillary 225 (H) 70 - 99 mg/dL    Comment: Glucose reference range applies only to samples taken after fasting for at least 8 hours.  Urinalysis, Complete w  Microscopic -Urine, Clean Catch     Status: Abnormal   Collection Time: 05/12/22 12:23 AM  Result Value Ref Range   Color, Urine STRAW (A) YELLOW   APPearance CLEAR CLEAR   Specific Gravity, Urine 1.008 1.005 - 1.030   pH 7.0 5.0 - 8.0   Glucose, UA 50 (A) NEGATIVE mg/dL   Hgb urine dipstick NEGATIVE NEGATIVE   Bilirubin Urine NEGATIVE NEGATIVE   Ketones, ur NEGATIVE NEGATIVE mg/dL   Protein, ur >=657 (A) NEGATIVE mg/dL   Nitrite NEGATIVE NEGATIVE   Leukocytes,Ua NEGATIVE NEGATIVE   RBC / HPF 0-5 0 - 5 RBC/hpf   WBC, UA 0-5 0 - 5 WBC/hpf   Bacteria,  UA NONE SEEN NONE SEEN   Squamous Epithelial / HPF 0-5 0 - 5 /HPF    Comment: Performed at [facility], [address] [place], [place] [address]  Prealbumin     Status: None   Collection Time: 05/12/22  1:48 AM  Result Value Ref Range   Prealbumin 26 18 - 38 mg/dL    Comment: Performed at [facility], [address], [place], [place] [address]  Vitamin B12     Status: None   Collection Time: 05/12/22  1:48 AM  Result Value Ref Range   Vitamin B-12 384 180 - 914 pg/mL    Comment: (NOTE) This assay is not validated for testing neonatal or myeloproliferative syndrome specimens for Vitamin B12 levels. Performed at [facility], [address], [place], [place] [address]   Folate     Status: None   Collection Time: 05/12/22  1:48 AM  Result Value Ref Range   Folate 8.1 >5.9 ng/mL    Comment: Performed at [facility], [address], [place], [place] [address]  Magnesium     Status: Abnormal   Collection Time: 05/12/22  1:48 AM  Result Value Ref Range   Magnesium 1.6 (L) 1.7 - 2.4 mg/dL    Comment: Performed at [facility], [address] [place], [place] [address]  Phosphorus     Status: Abnormal   Collection Time: 05/12/22  1:48 AM  Result Value Ref Range   Phosphorus 4.7 (H) 2.5 - 4.6 mg/dL    Comment: Performed at [facility], [address] [place], [place] [address]   Comprehensive metabolic panel     Status: Abnormal   Collection Time: 05/12/22  1:48 AM  Result Value Ref Range   Sodium 131 (L) 135 - 145 mmol/L   Potassium 4.1 3.5 - 5.1 mmol/L   Chloride 99 98 - 111 mmol/L   CO2 20 (L) 22 - 32 mmol/L   Glucose, Bld 224 (H) 70 - 99 mg/dL    Comment: Glucose reference range applies only to samples taken after fasting for at least 8 hours.   BUN 40 (H) 6 - 20 mg/dL   Creatinine, Ser 5.78 (H) 0.44 - 1.00 mg/dL   Calcium 8.3 (L) 8.9 - 10.3 mg/dL   Total Protein 6.2 (L) 6.5 - 8.1 g/dL   Albumin 2.4 (L) 3.5 - 5.0 g/dL   AST 11 (L) 15 - 41 U/L   ALT 11 0 - 44 U/L   Alkaline Phosphatase 64 38 - 126 U/L   Total Bilirubin 0.3 0.3 - 1.2 mg/dL   GFR, Estimated 11 (L) >60 mL/min    Comment: (NOTE) Calculated using the CKD-EPI Creatinine Equation (2021)    Anion gap 12 5 - 15    Comment: Performed at [facility], [address], [place], [place] [address]  CBC     Status: Abnormal   Collection Time: 05/12/22  1:48 AM  Result Value Ref Range   WBC 7.1 4.0 - 10.5 K/uL   RBC 2.78 (L) 3.87 - 5.11 MIL/uL   Hemoglobin 7.5 (L) 12.0 - 15.0 g/dL   HCT 95.2 (L) 84.1 - 32.4 %   MCV 83.5 80.0 - 100.0 fL   MCH 27.0 26.0 - 34.0 pg   MCHC 32.3 30.0 - 36.0 g/dL   RDW 40.1 02.7 - 25.3 %   Platelets 305 150 - 400 K/uL    Comment: REPEATED TO  VERIFY   nRBC 0.0 0.0 - 0.2 %    Comment: Performed at [facility], [address], [place], [place] [address]  Glucose, capillary     Status: Abnormal   Collection Time: 05/12/22  4:31 AM  Result Value Ref Range   Glucose-Capillary 179 (H) 70 - 99 mg/dL    Comment: Glucose reference range applies only to samples taken after fasting for at least 8 hours.  Glucose, capillary     Status: Abnormal   Collection Time: 05/12/22  7:46 AM  Result Value Ref Range   Glucose-Capillary 178 (H) 70 - 99 mg/dL    Comment: Glucose reference range applies only to samples taken after fasting for at least 8 hours.  Glucose,  capillary     Status: Abnormal   Collection Time: 05/12/22 12:08 PM  Result Value Ref Range   Glucose-Capillary 136 (H) 70 - 99 mg/dL    Comment: Glucose reference range applies only to samples taken after fasting for at least 8 hours.  Glucose, capillary     Status: Abnormal   Collection Time: 05/12/22  5:27 PM  Result Value Ref Range   Glucose-Capillary 181 (H) 70 - 99 mg/dL    Comment: Glucose reference range applies only to samples taken after fasting for at least 8 hours.   ECHOCARDIOGRAM COMPLETE  Result Date: 05/12/2022    ECHOCARDIOGRAM REPORT   Patient Name:   Karina [name] Date of Exam: 05/12/2022 Medical Rec #:  [id number]        Height:       62.0 in Accession #:    [id number]       Weight:       238.3 lb Date of Birth:  [birth year]        BSA:          2.059 m Patient Age:    36 years         BP:           142/75 mmHg Patient Gender: F                HR:           96 bpm. Exam Location:  Inpatient Procedure: 2D Echo, Cardiac Doppler and Color Doppler Indications:    Elevated Troponin, Syncope R55  History:        Patient has prior history of Echocardiogram examinations, most                 recent 08/09/2019. Arrythmias:Tachycardia, Signs/Symptoms:Dyspnea                 and Shortness of Breath; Risk Factors:Hypertension, Diabetes and                 Sleep Apnea. CKD, stage 3.  Sonographer:    Lucendia Herrlich Referring Phys: [id number] ANASTASSIA DOUTOVA IMPRESSIONS  1. Left ventricular ejection fraction, by estimation, is 60 to 65%. The left ventricle has normal function. The left ventricle has no regional wall motion abnormalities. There is moderate concentric left ventricular hypertrophy. Indeterminate diastolic filling due to E-A fusion.  2. Right ventricular systolic function is normal. The right ventricular size is normal.  3. Prominent Crista terminalis.  4. A small pericardial effusion is present. The pericardial effusion is posterior to the left ventricle.  5. The mitral valve is normal in  structure. No evidence of mitral valve regurgitation. No evidence of mitral stenosis.  6. The aortic valve was not well visualized. Aortic  valve regurgitation is not visualized. No aortic stenosis is present. Comparison(s): No significant change from prior study. FINDINGS  Left Ventricle: Left ventricular ejection fraction, by estimation, is 60 to 65%. The left ventricle has normal function. The left ventricle has no regional wall motion abnormalities. The left ventricular internal cavity size was normal in size. There is  moderate concentric left ventricular hypertrophy. Indeterminate diastolic filling due to E-A fusion. Right Ventricle: The right ventricular size is normal. No increase in right ventricular wall thickness. Right ventricular systolic function is normal. Left Atrium: Left atrial size was normal in size. Right Atrium: Prominent Crista terminalis. Right atrial size was normal in size. Prominent Crista terminalis. Pericardium: A small pericardial effusion is present. The pericardial effusion is posterior to the left ventricle. Mitral Valve: The mitral valve is normal in structure. No evidence of mitral valve regurgitation. No evidence of mitral valve stenosis. Tricuspid Valve: The tricuspid valve is normal in structure. Tricuspid valve regurgitation is trivial. No evidence of tricuspid stenosis. Aortic Valve: The aortic valve was not well visualized. Aortic valve regurgitation is not visualized. No aortic stenosis is present. Aortic valve peak gradient measures 10.4 mmHg. Pulmonic Valve: The pulmonic valve was normal in structure. Pulmonic valve regurgitation is trivial. No evidence of pulmonic stenosis. Aorta: The aortic root and ascending aorta are structurally normal, with no evidence of dilitation. IAS/Shunts: No atrial level shunt detected by color flow Doppler.  LEFT VENTRICLE PLAX 2D LVIDd:         3.90 cm   Diastology LVIDs:         2.70 cm   LV e' medial:    6.99 cm/s LV PW:         1.40 cm    LV E/e' medial:  11.5 LV IVS:        1.40 cm   LV e' lateral:   9.17 cm/s LVOT diam:     2.10 cm   LV E/e' lateral: 8.8 LV SV:         76 LV SV Index:   37 LVOT Area:     3.46 cm  RIGHT VENTRICLE             IVC RV S prime:     14.50 cm/s  IVC diam: 1.40 cm TAPSE (M-mode): 2.0 cm LEFT ATRIUM             Index        RIGHT ATRIUM          Index LA diam:        3.70 cm 1.80 cm/m   RA Area:     9.87 cm LA Vol (A2C):   39.7 ml 19.28 ml/m  RA Volume:   19.13 ml 9.29 ml/m LA Vol (A4C):   35.5 ml 17.24 ml/m LA Biplane Vol: 40.6 ml 19.71 ml/m  AORTIC VALVE AV Area (Vmax): 2.62 cm AV Vmax:        161.00 cm/s AV Peak Grad:   10.4 mmHg LVOT Vmax:      122.00 cm/s LVOT Vmean:     81.400 cm/s LVOT VTI:       0.220 m  AORTA Ao Root diam: 2.80 cm Ao Asc diam:  2.70 cm MITRAL VALVE               TRICUSPID VALVE MV Area (PHT): 4.06 cm    TR Peak grad:   11.4 mmHg MV Decel Time: 187 msec    TR Vmax:  169.00 cm/s MV E velocity: 80.60 cm/s MV A velocity: 76.40 cm/s  SHUNTS MV E/A ratio:  1.05        Systemic VTI:  0.22 m                            Systemic Diam: 2.10 cm Riley Lam MD Electronically signed by Riley Lam MD Signature Date/Time: 05/12/2022/11:22:29 AM    Final    CT MAXILLOFACIAL WO CONTRAST  Result Date: 05/11/2022 CLINICAL DATA:  Temporomandibular joint pain EXAM: CT MAXILLOFACIAL WITHOUT CONTRAST TECHNIQUE: Multidetector CT imaging of the maxillofacial structures was performed. Multiplanar CT image reconstructions were also generated. RADIATION DOSE REDUCTION: This exam was performed according to the departmental dose-optimization program which includes automated exposure control, adjustment of the mA and/or kV according to patient size and/or use of iterative reconstruction technique. COMPARISON:  None Available. FINDINGS: Osseous: Bony structures are within normal limits. No acute fracture or dislocation is noted. No abnormality of the TMJ joints are seen. No dental abscess or  periapical lucencies are noted. Orbits: Orbits and their contents are within normal limits. Sinuses: Paranasal sinuses are unremarkable. Soft tissues: No soft tissue abnormality is noted. Limited intracranial: Within normal limits. IMPRESSION: No acute abnormality noted. Electronically Signed   By: Alcide Clever M.D.   On: 05/11/2022 21:49   CT Renal Stone Study  Result Date: 05/11/2022 CLINICAL DATA:  Evaluate for bowel obstruction EXAM: CT ABDOMEN AND PELVIS WITHOUT CONTRAST TECHNIQUE: Multidetector CT imaging of the abdomen and pelvis was performed following the standard protocol without IV contrast. RADIATION DOSE REDUCTION: This exam was performed according to the departmental dose-optimization program which includes automated exposure control, adjustment of the mA and/or kV according to patient size and/or use of iterative reconstruction technique. COMPARISON:  CT abdomen and pelvis 11/19/2020 FINDINGS: Lower chest: No acute abnormality. Hepatobiliary: No focal liver abnormality is seen. No gallstones, gallbladder wall thickening, or biliary dilatation. Pancreas: Unremarkable. No pancreatic ductal dilatation or surrounding inflammatory changes. Spleen: Normal in size without focal abnormality. Adrenals/Urinary Tract: Adrenal glands are unremarkable. Kidneys are normal, without renal calculi, focal lesion, or hydronephrosis. Bladder is unremarkable. Stomach/Bowel: Stomach is within normal limits. Appendix appears normal. No evidence of bowel wall thickening, distention, or inflammatory changes. Vascular/Lymphatic: No significant vascular findings are present. No enlarged abdominal or pelvic lymph nodes. Reproductive: Uterus and bilateral adnexa are unremarkable. Other: No abdominal wall hernia or abnormality. No abdominopelvic ascites. Musculoskeletal: No acute or significant osseous findings. IMPRESSION: 1. No CT evidence of acute abdominal/pelvic process. Electronically Signed   By: Darliss Cheney M.D.    On: 05/11/2022 19:18   DG Chest 2 View  Result Date: 05/11/2022 CLINICAL DATA:  Shortness of breath and dizziness EXAM: CHEST - 2 VIEW COMPARISON:  Radiographs 04/29/2022 FINDINGS: The heart size and mediastinal contours are within normal limits. Both lungs are clear. The visualized skeletal structures are unremarkable. IMPRESSION: No active cardiopulmonary disease. Electronically Signed   By: Minerva Fester M.D.   On: 05/11/2022 17:53    PMH:   Past Medical History:  Diagnosis Date   Acute pain of right shoulder 03/13/2019   Allergy    Angio-edema    Asthma    Back pain 12/12/2017   Carpal tunnel syndrome of right wrist 03/13/2019   COVID-19 01/24/2019   Depression 12/27/2012   Diabetes mellitus    Type 2, insulin resistant   Diabetic neuropathy, type II diabetes mellitus (HCC) 05/23/2013   DOE (  dyspnea on exertion) 05/21/2019   Onset with covid 19 infection symptoms started 01/16/19  -  05/23/2019   Walked RA  3 laps @ approx 279ft each @ moderat pace  stopped due to end of study,  no limiting sob/ sats 100% at end despite reported sob at rest    Essential hypertension, benign 12/12/2013   GERD (gastroesophageal reflux disease)    History of COVID-19 03/22/2019   Lower extremity edema 08/29/2017   Moderate nonproliferative diabetic retinopathy (HCC) 05/24/2013   Both OS and OD.  Diagnosed by Dr. Altamease Oiler, MD, PhD 05/23/13. Also mild macular edema. Follows with Dr. 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, with long-term current use of insulin (HCC) 12/27/2012        Upper airway cough syndrome 03/22/2019    PSH:   Past Surgical History:  Procedure Laterality Date    CERVICAL BIOPSY  2004   CESAREAN SECTION N/A 06/02/2012   Procedure: CESAREAN SECTION;  Surgeon: Catalina Antigua, MD;  Location: [facility];  Service: Obstetrics;  Laterality: N/A;   RETINAL DETACHMENT REPAIR W/ SCLERAL BUCKLE LE Left 10/08/2019    Allergies:  Allergies  Allergen Reactions   Lisinopril Cough   Egg-Derived Products Nausea And Vomiting   Contrast Media [Iodinated Contrast Media]     Medications:   Prior to Admission medications   Medication Sig Start Date End Date Taking? Authorizing Provider  albuterol (VENTOLIN HFA) 108 (90 Base) MCG/ACT inhaler Inhale 1-2 puffs into the lungs every 4 (four) hours as needed for shortness of breath.   Yes [provider]  benzonatate (TESSALON) 200 MG capsule Take 1 capsule (200 mg total) by mouth 3 (three) times daily as needed for cough. 01/22/22  Yes Nyoka Cowden, MD  carvedilol (COREG) 25 MG tablet Take 25 mg by mouth 2 (two) times daily with a meal.   Yes [provider]  cetirizine (ZYRTEC) 5 MG tablet Take 1 tablet (5 mg total) by mouth every 3 (three) days. 05/04/22  Yes Wallis Bamberg, PA-C  CHLOROPHYLL PO Take 18 drops by mouth once a week.   Yes [provider]  fluticasone (FLONASE) 50 MCG/ACT nasal spray Place 1 spray into both nostrils daily. 04/27/22  Yes Radford Pax, NP  furosemide (LASIX) 20 MG tablet Take 40 mg by mouth daily.   Yes [provider]  HUMALOG KWIKPEN 200 UNIT/ML KwikPen INJECT 20 TO 28 UNITS SUBCUTANEOUSLY THREE TIMES DAILY Patient taking differently: INJECT 16 to 20 UNITS SUBCUTANEOUSLY THREE TIMES DAILY 07/28/20  Yes Fayette Pho, MD  hydrALAZINE (APRESOLINE) 100 MG tablet Take 100 mg by mouth 2 (two) times daily. 05/10/22  Yes [provider]  levocetirizine (XYZAL) 5 MG tablet Take 5 mg by mouth every evening. 04/15/22  Yes [provider]  losartan (COZAAR) 100 MG tablet Take 100 mg by mouth daily.   Yes [provider]  OIL OF OREGANO PO Take  1 drop by mouth once a week.   Yes [provider]  pantoprazole (PROTONIX) 40 MG tablet Take 30- 60 min before your first and last meals of the day 01/22/22  Yes Nyoka Cowden, MD  promethazine-dextromethorphan (  PROMETHAZINE-DM) 6.25-15 MG/5ML syrup Take 5 mLs by mouth 3 (three) times daily as needed for cough. 04/15/22  Yes Wallis Bamberg, PA-C  Semaglutide,0.25 or 0.5MG /DOS, (OZEMPIC, 0.25 OR 0.5 MG/DOSE,) 2 MG/3ML SOPN Inject into the skin.   Yes [provider]  TRESIBA FLEXTOUCH 200 UNIT/ML FlexTouch Pen INJECT 50 UNITS SUBCUTANEOUSLY ONCE DAILY FOR 28 DAYS Patient taking differently: 48 Units daily. 07/28/20  Yes Fayette Pho, MD  amoxicillin-clavulanate (AUGMENTIN) 875-125 MG tablet Take 1 tablet by mouth 2 (two) times daily. Patient not taking: Reported on 04/29/2022 04/15/22   Wallis Bamberg, PA-C  atorvastatin (LIPITOR) 80 MG tablet Take 80 mg by mouth daily. Patient not taking: Reported on 05/11/2022 09/10/20   [provider]  baclofen (LIORESAL) 10 MG tablet Take 1 tablet (10 mg total) by mouth 3 (three) times daily. Patient not taking: Reported on 05/11/2022 11/19/20   Shirlean Mylar, MD  Blood Pressure Monitoring (BLOOD PRESSURE MONITOR AUTOMAT) DEVI 1 kit by Does not apply route daily. Please call the office if you have any issues obtaining this monitor. 08/08/19   Melene Plan, MD  carvedilol (COREG) 12.5 MG tablet Take 12.5 mg by mouth 2 (two) times daily with a meal. Patient not taking: Reported on 04/29/2022    [provider]  Cholecalciferol (VITAMIN D3) 50 MCG (2000 UT) CHEW Chew 2,000 Units by mouth daily. Patient not taking: Reported on 05/11/2022 02/06/20   Meccariello, Solmon Ice, MD  Continuous Blood Gluc Sensor (DEXCOM G6 SENSOR) MISC PLACE ONE SENSOR EVERY 10 DAYS Patient not taking: Reported on 04/29/2022 03/30/21   Fayette Pho, MD  Continuous Blood Gluc Transmit (DEXCOM G6 TRANSMITTER) MISC 1  ONCE DAILY FOR 90 DAYS 06/18/21   Autry-Lott,  Randa Evens, DO  cyclobenzaprine (FLEXERIL) 5 MG tablet 5 mg at bedtime as needed for muscle spasms. 04/21/22   [provider]  injection device for insulin (INPEN 100-PINK-NOVO) DEVI Use as directed with novolog cartridges 04/10/20   Janit Pagan T, MD  Insulin Pen Needle (B-D ULTRAFINE III SHORT PEN) 31G X 8 MM MISC 1 Container by Does not apply route as needed. 08/08/19   Melene Plan, MD  pregabalin (LYRICA) 75 MG capsule Take 1 capsule by mouth twice daily Patient not taking: Reported on 05/11/2022 01/20/21   Candelaria Stagers, DPM  pregabalin (LYRICA) 75 MG capsule Take 1 capsule (75 mg total) by mouth 2 (two) times daily. Patient not taking: Reported on 04/29/2022 11/19/21   Candelaria Stagers, DPM  spironolactone (ALDACTONE) 25 MG tablet Take by mouth. Patient not taking: Reported on 04/29/2022 11/25/21   [provider]  terbinafine (LAMISIL) 250 MG tablet Take 1 tablet (250 mg total) by mouth daily. Patient not taking: Reported on 04/29/2022 11/20/21   Candelaria Stagers, DPM  gabapentin (NEURONTIN) 100 MG capsule Take 1 capsule (100 mg total) by mouth at bedtime. 08/25/18 01/31/19  Lennox Solders, MD    Discontinued Meds:  There are no discontinued medications.  Social History:  reports that she has never smoked. She has never used smokeless tobacco. She reports that she does not drink alcohol and does not use drugs.  Family History:   Family History  Problem Relation Age of Onset   Diabetes Father    Stomach cancer Father    Cirrhosis Father    Cancer Mother    Diabetes Mother    Breast cancer Mother    Rectal cancer Neg Hx    Esophageal cancer Neg Hx  Colon cancer Neg Hx     Blood pressure 126/77, pulse 90, temperature 97.9 F (36.6 C), resp. rate 18, height 5\' 2"  (1.575 m), weight 108.1 kg, last menstrual period 04/18/2022, SpO2 96 %. General appearance: alert, cooperative, and appears stated age Head: Normocephalic, without obvious abnormality, atraumatic Eyes:  negative Back: symmetric, no curvature. ROM normal. No CVA tenderness. Resp: clear to auscultation bilaterally Cardio: regular rate and rhythm GI: soft, non-tender; bowel sounds normal; no masses,  no organomegaly Extremities: edema tr Pulses: 2+ and symmetric Skin: Skin color, texture, turgor normal. No rashes or lesions       Ethelene Hal, MD 05/12/2022, 5:33 PM

## 2022-05-12 NOTE — Progress Notes (Signed)
  Transition of Care (TOC) Screening Note   Patient Details  Name: Karina [name] Date of Birth: [birth year]   Transition of Care [facility]) CM/SW Contact:    Leone Haven, RN Phone Number: 05/12/2022, 4:02 PM    Transition of Care Department [facility]) has reviewed patient , presents with hypotension and tooth abscess, conts on iv abx, she has PCP on file and insurance.  We will continue to monitor patient advancement through interdisciplinary progression rounds. If new patient transition needs arise, please place a TOC consult.

## 2022-05-12 NOTE — Inpatient Diabetes Management (Signed)
Inpatient Diabetes Program Recommendations  AACE/ADA: New Consensus Statement on Inpatient Glycemic Control (2015)  Target Ranges:  Prepandial:   less than 140 mg/dL      Peak postprandial:   less than 180 mg/dL (1-2 hours)      Critically ill patients:  140 - 180 mg/dL   Lab Results  Component Value Date   GLUCAP 136 (H) 05/12/2022   HGBA1C 10.2 (H) 05/11/2022    Review of Glycemic Control  Diabetes history: DM2 Outpatient Diabetes medications: Tresiba 48 units QHS, Humalog 16-20 units TID, Ozempic weekly  Current orders for Inpatient glycemic control: Semglee 35 units QHS, Novolog 0-6 units Q4H  Inpatient Diabetes Program Recommendations:    Novolog 0-6 units TID and 0-5 units QHS as patient is eating.  Spoke with patient at bedside.  Reviewed patient's current A1c of 10.2%.   Explained what a A1c is and what it measures. Also reviewed goal A1c with patient, importance of good glucose control @ home, and blood sugar goals.  She states this A1C is down from 12 % in January.  She started Ozempic 2 months ago and since then her blood glucose has been much better.  She confirms above home medications and dose not skip doses.  Occasional hypoglycemia which she states she over treats.  Encouraged her to use the 15:15 method when BG is low.  Ordered LWWD booklet; it's at bedside.  She is current with her PCP.  Educated on The Plate Method, CHO's, portion control, CBGs at home fasting and mid afternoon, F/U with PCP every 3 months, bring meter to PCP office, long and short term complications of uncontrolled BG, and importance of exercise.  Will continue to follow while inpatient.  Thank you, Dulce Sellar, MSN, CDCES Diabetes Coordinator Inpatient Diabetes Program [phone] (team pager from 8a-5p)

## 2022-05-12 NOTE — Progress Notes (Signed)
Initial Nutrition Assessment  DOCUMENTATION CODES:   Morbid obesity  INTERVENTION:   Recommend continued outpatient education and follow up by a Registered Dietitian - outpatient referral ordered  Provided Carbohydrate Counting handout on AVS  NUTRITION DIAGNOSIS:   Altered nutrition lab value related to  (uncontrolled DM) as evidenced by  (A1C 10.2).  GOAL:   Patient will meet greater than or equal to 90% of their needs  MONITOR:   Weight trends, Labs  REASON FOR ASSESSMENT:   Consult Assessment of nutrition requirement/status  ASSESSMENT:   Pt with PMH of depression, DM type 2 insulin resistant, diabetic neuropathy, morbid obesity, diabetic retinopathy, COVID-19, HTN, and CKD V admitted with hypotension, tooth infection and upper airway cough syndrome (cough x 1 month).   Pt seen by outpatient RD in 2022. At the time she was 118.8 kg she is currently 108.1 kg. Per RD note pt was to get sleeve gastrectomy later that year but no record found of surgery.  Noted pt on oxempic PTA, has lost 2 kg since December 2023. Per chart review pt reports decreased appetite due to oxempic.  Pt unable to wear CPAP due to mouth pain.   DM coordinator consult pending  Meal Completion: 100%  Medications reviewed and include: SSI, 35 units semglee daily, protonix  Unasyn  Labs reviewed: Na 131 BUN 40 Cr 4.70 Phos 4.7 Magnesium 1.6  Vitamin B12 384 A1C 10.2 CBG's: 178  NUTRITION - FOCUSED PHYSICAL EXAM:  Deferred, RD working remotely   Diet Order:   Diet Order             Diet heart healthy/carb modified Room service appropriate? Yes; Fluid consistency: Thin  Diet effective now                   EDUCATION NEEDS:   Education needs have been addressed  Skin:  Skin Assessment: Reviewed RN Assessment  Last BM:  4/28  Height:   Ht Readings from Last 1 Encounters:  05/11/22 5\' 2"  (1.575 m)    Weight:   Wt Readings from Last 1 Encounters:  05/12/22 108.1 kg     BMI:  Body mass index is 43.59 kg/m.  Estimated Nutritional Needs:   Kcal:  1800-2000  Protein:  90-110 grams  Fluid:  >1.8 L/day  Cammy Copa., RD, LDN, CNSC See AMiON for contact information

## 2022-05-12 NOTE — Discharge Instructions (Signed)

## 2022-05-12 NOTE — Progress Notes (Signed)
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  She was recommended to walk with.  She became short of breath and dizzy and she had to sit down.  She states that none of her medications and recently adjusted she had developed hypokalemia in the case of her CKD and her Lasix has been increased her spironolactone has just been stopped.  She reported shortness of breath that has been progressive the last few weeks with no lower extremity edema.  She denies black stools.  She any chest pain but had a slight discomfort.  Upon arrival to ED her blood pressure was on the lower side 102/58.  Not been having good appetite given that she got Ozempic and she reports that she has had a cough for least a month or so and has been using amoxicillin prednisone.  She is presented to the extraction but did not make an appointment has been taking extra Advil to help with pain.  Given her dizziness and shortness of breath she presented to the ED for further evaluation.  Recently her nephrologist increased her hydralazine from 25 mg twice daily to 100 twice daily and her new nephrologist has been taking her off the spironolactone.  Patient states that her Lasix was increased from 20 mg to 40 mg p.o. daily and this was done 2 weeks ago.  She was admitted for that her symptoms and was found to have metapneumovirus and was given gentle IV fluid hydration for hypotension.  IV fluid hydration has now been held and nephrology has been consulted given her recent changes in her medications.  Assessment and Plan:  Hypotension -Due to her poor p.o. 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, with long-term current use of insulin (HCC) -Order Sensitive SSI  -Continue home insulin but decreased to  35units, -Checked TSH and was 1.774 and HgA1C and HbA1c was 10.2 -Hold ozempic -CBG Trend:  Recent Labs  Lab 05/11/22 1758 05/11/22 2220 05/12/22 0017 05/12/22 0431 05/12/22 0746 05/12/22 1208 05/12/22 1727  GLUCAP 189* 171* 225* 179* 178* 136* 181*     CKD (chronic kidney disease), Stage IV (HCC) -Chronic  -BUN/Cr Trend: Recent Labs  Lab 04/23/22 0010 05/11/22 1625 05/11/22 2252 05/12/22 0148  BUN 44* 38* 41* 40*  CREATININE 4.95* 5.12* 4.88* 4.79*  -IVF Hydration now stopped  -Avoid Nephrotoxic Medications such as NSAIDs, Vanomycin/Zosyn combination, Contrast Dyes, Hypotension and Dehydration to Ensure Adequate Renal Perfusion and will need to Renally Adjust Meds -Continue to Monitor and Trend Renal Function carefully and repeat CMP in the AM  -Defer Further Fluids to Nephrology as I spoke with Dr. Juel Burrow   Postural dizziness with presyncope -Due to hypotension  -Check ECHO and showed "Left ventricular ejection fraction, by estimation, is 60 to 65%. The left ventricle has normal function. The left ventricle has no regional wall motion abnormalities. There is moderate concentric left ventricular  hypertrophy. Indeterminate diastolic filling due to E-A fusion. Right  ventricular systolic function is normal. The right ventricular size is normal. Prominent Crista terminalis. A small pericardial effusion is present. The pericardial effusion is posterior to the left ventricle. The mitral valve is normal in structure. No evidence of mitral valve regurgitation. No evidence of mitral stenosis. The aortic valve was not well  visualized. Aortic valve regurgitation is not visualized. No aortic stenosis is present. -Checked TSH and was 1.774 and obtain PT/OT evaluation and repeat Orthostatics in the AM   Tooth infection -Severe pain and tenderness over right cheek -No fever -Able to open mouth and has no drooling -Ordered IV Unasyn and Maxillofacial CT Scan -Maxillofacial CT done and showed "Bony structures are within normal limits. No acute fracture or dislocation is noted. No abnormality of the TMJ joints are seen. No dental abscess or periapical lucencies are noted." -Pain been ongoing for the past 3 wks doubt acute surgical indication  -Will need follow up with oral surgery in the outpatient setting -Pt has no evidence of sepsis or systemic infection    Upper airway cough syndrome in the setting of Metapneumovirus  -Cont albuterol prn -Check resp panel and + for Metapneumovirus -Add Guaifenesin 1200 mg po BID, Flutter Valve, and Incentive Spirometry -CXR done and showed "No active cardiopulmonary disease" -Repeat CXR in the AM   OSA on CPAP -Order CPAP qhs   Hypertension -Allow permissive htn for now  Hypoalbuminemia -Patient's Albumin Trend Recent Labs  Lab 05/11/22 1625 05/11/22 2012 05/12/22 0148  ALBUMIN 2.4* 2.5* 2.4*  -Continue to Monitor and Trend and repeat CMP in the AM   Morbid Obesity -Complicates overall prognosis and care -Estimated body mass index is 43.59 kg/m as calculated from the following:   Height as of this encounter: 5\' 2"  (1.575 m).   Weight as of this encounter: 108.1 kg.  -Weight Loss and Dietary Counseling given and will need close PCP follow up     DVT prophylaxis: SCDs Start: 05/11/22 2231    Code Status: Full Code Family Communication: No family currently at bedside  Disposition Plan:  Level of care: Progressive Status is: Inpatient Remains inpatient appropriate because: Anticipating discharging once her dizziness with presyncope is improved and  nephrology is cleared the patient   Consultants:  Nephrology  Procedures:  ECHOCARDIOGRAM IMPRESSIONS     1. Left ventricular ejection fraction, by estimation, is 60 to 65%. The  left ventricle has normal function. The left ventricle has no regional  wall motion abnormalities. There is moderate concentric left ventricular  hypertrophy. Indeterminate diastolic  filling due to E-A fusion.   2. Right ventricular systolic function is normal. The right ventricular  size is normal.   3. Prominent Crista terminalis.   4. A small pericardial effusion is present. The pericardial effusion is  posterior to the left ventricle.   5. The mitral valve is normal in structure. No evidence of mitral valve  regurgitation. No evidence of mitral stenosis.   6. The aortic valve was not well visualized. Aortic valve regurgitation  is not visualized. No aortic stenosis is present.   Comparison(s): No significant change from prior study.   FINDINGS   Left Ventricle: Left ventricular ejection fraction, by estimation, is 60  to 65%. The left ventricle has normal function. The left ventricle has no  regional wall motion abnormalities. The left ventricular internal cavity  size was normal in size. There is   moderate concentric left ventricular hypertrophy. Indeterminate diastolic  filling due to E-A fusion.   Right Ventricle: The  right ventricular size is normal. No increase in  right ventricular wall thickness. Right ventricular systolic function is  normal.   Left Atrium: Left atrial size was normal in size.   Right Atrium: Prominent Crista terminalis. Right atrial size was normal in  size. Prominent Crista terminalis.   Pericardium: A small pericardial effusion is present. The pericardial  effusion is posterior to the left ventricle.   Mitral Valve: The mitral valve is normal in structure. No evidence of  mitral valve regurgitation. No evidence of mitral valve stenosis.   Tricuspid Valve:  The tricuspid valve is normal in structure. Tricuspid  valve regurgitation is trivial. No evidence of tricuspid stenosis.   Aortic Valve: The aortic valve was not well visualized. Aortic valve  regurgitation is not visualized. No aortic stenosis is present. Aortic  valve peak gradient measures 10.4 mmHg.   Pulmonic Valve: The pulmonic valve was normal in structure. Pulmonic valve  regurgitation is trivial. No evidence of pulmonic stenosis.   Aorta: The aortic root and ascending aorta are structurally normal, with  no evidence of dilitation.   IAS/Shunts: No atrial level shunt detected by color flow Doppler.     LEFT VENTRICLE  PLAX 2D  LVIDd:         3.90 cm   Diastology  LVIDs:         2.70 cm   LV e' medial:    6.99 cm/s  LV PW:         1.40 cm   LV E/e' medial:  11.5  LV IVS:        1.40 cm   LV e' lateral:   9.17 cm/s  LVOT diam:     2.10 cm   LV E/e' lateral: 8.8  LV SV:         76  LV SV Index:   37  LVOT Area:     3.46 cm     RIGHT VENTRICLE             IVC  RV S prime:     14.50 cm/s  IVC diam: 1.40 cm  TAPSE (M-mode): 2.0 cm   LEFT ATRIUM             Index        RIGHT ATRIUM          Index  LA diam:        3.70 cm 1.80 cm/m   RA Area:     9.87 cm  LA Vol (A2C):   39.7 ml 19.28 ml/m  RA Volume:   19.13 ml 9.29 ml/m  LA Vol (A4C):   35.5 ml 17.24 ml/m  LA Biplane Vol: 40.6 ml 19.71 ml/m   AORTIC VALVE  AV Area (Vmax): 2.62 cm  AV Vmax:        161.00 cm/s  AV Peak Grad:   10.4 mmHg  LVOT Vmax:      122.00 cm/s  LVOT Vmean:     81.400 cm/s  LVOT VTI:       0.220 m    AORTA  Ao Root diam: 2.80 cm  Ao Asc diam:  2.70 cm   MITRAL VALVE               TRICUSPID VALVE  MV Area (PHT): 4.06 cm    TR Peak grad:   11.4 mmHg  MV Decel Time: 187 msec    TR Vmax:        169.00 cm/s  MV E  velocity: 80.60 cm/s  MV A velocity: 76.40 cm/s  SHUNTS  MV E/A ratio:  1.05        Systemic VTI:  0.22 m                             Systemic Diam: 2.10 cm    Antimicrobials:  Anti-infectives (From admission, onward)    Start     Dose/Rate Route Frequency Ordered Stop   05/11/22 2100  Ampicillin-Sulbactam (UNASYN) 3 g in sodium chloride 0.9 % 100 mL IVPB        3 g 200 mL/hr over 30 Minutes Intravenous Every 12 hours 05/11/22 2054         Subjective: Seen and examined at bedside and patient was still feeling a little bit dizzy.  Complaining of her significant toothache on the upper right side.  Feels okay but denies any chest pain.  No other concerns or complaints at this time.  Objective: Vitals:   05/12/22 0435 05/12/22 0444 05/12/22 1504 05/12/22 1505  BP: (!) 142/75  126/77 126/77  Pulse:   90 90  Resp: 13  13 18   Temp: 98.5 F (36.9 C)  97.9 F (36.6 C) 97.9 F (36.6 C)  TempSrc: Oral     SpO2: 96%     Weight:  108.1 kg    Height:        Intake/Output Summary (Last 24 hours) at 05/12/2022 1721 Last data filed at 05/12/2022 0700 Gross per 24 hour  Intake 920 ml  Output 650 ml  Net 270 ml   Filed Weights   05/11/22 2045 05/11/22 2223 05/12/22 0444  Weight: 109.5 kg 109.5 kg 108.1 kg   Examination: Physical Exam:  Constitutional: WN/WD morbidly obese African-American female appears a little uncomfortable getting echocardiogram Respiratory: Diminished to auscultation bilaterally, no wheezing, rales, rhonchi or crackles. Normal respiratory effort and patient is not tachypenic. No accessory muscle use.  Unlabored breathing Cardiovascular: RRR, no murmurs / rubs / gallops. S1 and S2 auscultated.  Trace extremity edema Abdomen: Soft, non-tender, distended secondary to body habitus. Bowel sounds positive.  GU: Deferred. Musculoskeletal: No clubbing / cyanosis of digits/nails. No joint deformity upper and lower extremities. Skin: No rashes, lesions, ulcers on a limited skin evaluation. No induration; Warm and dry.  Neurologic: CN 2-12 grossly intact with no focal deficits. Romberg sign and cerebellar reflexes not assessed.   Psychiatric: Normal judgment and insight. Alert and oriented x 3. Normal mood and appropriate affect.   Data Reviewed: I have personally reviewed following labs and imaging studies  CBC: Recent Labs  Lab 05/11/22 1625 05/11/22 2252 05/12/22 0148  WBC 8.0 6.9 7.1  NEUTROABS 5.8 4.0  --   HGB 9.1* 8.7* 7.5*  HCT 28.5* 25.9* 23.2*  MCV 85.3 82.5 83.5  PLT 311 313 305   Basic Metabolic Panel: Recent Labs  Lab 05/11/22 1625 05/11/22 2252 05/12/22 0148  NA 134* 133* 131*  K 5.2* 4.2 4.1  CL 102 101 99  CO2 24 22 20*  GLUCOSE 234* 170* 224*  BUN 38* 41* 40*  CREATININE 5.12* 4.88* 4.79*  CALCIUM 8.7* 8.6* 8.3*  MG  --   --  1.6*  PHOS  --   --  4.7*   GFR: Estimated Creatinine Clearance: 18.4 mL/min (A) (by C-G formula based on SCr of 4.79 mg/dL (H)). Liver Function Tests: Recent Labs  Lab 05/11/22 1625 05/11/22 2012 05/12/22 0148  AST 15 12* 11*  ALT 12 11 11   ALKPHOS 68 68 64  BILITOT 0.3 0.3 0.3  PROT 6.8 6.5 6.2*  ALBUMIN 2.4* 2.5* 2.4*   No results for input(s): "LIPASE", "AMYLASE" in the last 168 hours. No results for input(s): "AMMONIA" in the last 168 hours. Coagulation Profile: No results for input(s): "INR", "PROTIME" in the last 168 hours. Cardiac Enzymes: Recent Labs  Lab 05/11/22 2012  CKTOTAL 197   BNP (last 3 results) No results for input(s): "PROBNP" in the last 8760 hours. HbA1C: Recent Labs    05/11/22 2252  HGBA1C 10.2*   CBG: Recent Labs  Lab 05/11/22 2220 05/12/22 0017 05/12/22 0431 05/12/22 0746 05/12/22 1208  GLUCAP 171* 225* 179* 178* 136*   Lipid Profile: No results for input(s): "CHOL", "HDL", "LDLCALC", "TRIG", "CHOLHDL", "LDLDIRECT" in the last 72 hours. Thyroid Function Tests: Recent Labs    05/11/22 2012  TSH 1.774   Anemia Panel: Recent Labs    05/11/22 2252 05/12/22 0148  VITAMINB12  --  384  FOLATE  --  8.1  FERRITIN 42  --   TIBC 259  --   IRON 37  --   RETICCTPCT 1.5  --    Sepsis  Labs: Recent Labs  Lab 05/11/22 2012 05/11/22 2252  LATICACIDVEN 0.7 0.6    Recent Results (from the past 240 hour(s))  Culture, group A strep     Status: None   Collection Time: 05/04/22 10:09 AM   Specimen: Throat  Result Value Ref Range Status   Specimen Description THROAT  Final   Special Requests NONE  Final   Culture   Final    NO GROUP A STREP (S.PYOGENES) ISOLATED Performed at [facility], [address], [place], [place] [address]    Report Status 05/07/2022 FINAL  Final  SARS Coronavirus 2 by RT PCR (hospital order, performed in [facility] hospital lab) *cepheid single result test* Anterior Nasal Swab     Status: None   Collection Time: 05/11/22 11:05 PM   Specimen: Anterior Nasal Swab  Result Value Ref Range Status   SARS Coronavirus 2 by RT PCR NEGATIVE NEGATIVE Final    Comment: Performed at [facility], [address], [place], [place] [address]  Respiratory (~20 pathogens) panel by PCR     Status: Abnormal   Collection Time: 05/11/22 11:05 PM   Specimen: Anterior Nasal Swab; Respiratory  Result Value Ref Range Status   Adenovirus NOT DETECTED NOT DETECTED Final   Coronavirus 229E NOT DETECTED NOT DETECTED Final    Comment: (NOTE) The Coronavirus on the Respiratory Panel, DOES NOT test for the novel  Coronavirus (2019 nCoV)    Coronavirus HKU1 NOT DETECTED NOT DETECTED Final   Coronavirus NL63 NOT DETECTED NOT DETECTED Final   Coronavirus OC43 NOT DETECTED NOT DETECTED Final   Metapneumovirus DETECTED (A) NOT DETECTED Final   Rhinovirus / Enterovirus NOT DETECTED NOT DETECTED Final   Influenza A NOT DETECTED NOT DETECTED Final   Influenza B NOT DETECTED NOT DETECTED Final   Parainfluenza Virus 1 NOT DETECTED NOT DETECTED Final   Parainfluenza Virus 2 NOT DETECTED NOT DETECTED Final   Parainfluenza Virus 3 NOT DETECTED NOT DETECTED Final   Parainfluenza Virus 4 NOT DETECTED NOT DETECTED Final   Respiratory Syncytial Virus NOT DETECTED NOT  DETECTED Final   Bordetella pertussis NOT DETECTED NOT DETECTED Final   Bordetella Parapertussis NOT DETECTED NOT DETECTED Final   Chlamydophila pneumoniae NOT DETECTED NOT DETECTED Final   Mycoplasma  pneumoniae NOT DETECTED NOT DETECTED Final    Comment: Performed at [facility], [address], [place], [place] [address]    Radiology Studies: ECHOCARDIOGRAM COMPLETE  Result Date: 05/12/2022    ECHOCARDIOGRAM REPORT   Patient Name:   JARISSA SHERIFF Date of Exam: 05/12/2022 Medical Rec #:  [id number]        Height:       62.0 in Accession #:    [id number]       Weight:       238.3 lb Date of Birth:  11/25/1985        BSA:          2.059 m Patient Age:    36 years         BP:           142/75 mmHg Patient Gender: F                HR:           96 bpm. Exam Location:  Inpatient Procedure: 2D Echo, Cardiac Doppler and Color Doppler Indications:    Elevated Troponin, Syncope R55  History:        Patient has prior history of Echocardiogram examinations, most                 recent 08/09/2019. Arrythmias:Tachycardia, Signs/Symptoms:Dyspnea                 and Shortness of Breath; Risk Factors:Hypertension, Diabetes and                 Sleep Apnea. CKD, stage 3.  Sonographer:    Lucendia Herrlich Referring Phys: [id number] ANASTASSIA DOUTOVA IMPRESSIONS  1. Left ventricular ejection fraction, by estimation, is 60 to 65%. The left ventricle has normal function. The left ventricle has no regional wall motion abnormalities. There is moderate concentric left ventricular hypertrophy. Indeterminate diastolic filling due to E-A fusion.  2. Right ventricular systolic function is normal. The right ventricular size is normal.  3. Prominent Crista terminalis.  4. A small pericardial effusion is present. The pericardial effusion is posterior to the left ventricle.  5. The mitral valve is normal in structure. No evidence of mitral valve regurgitation. No evidence of mitral stenosis.  6. The aortic valve was not well visualized.  Aortic valve regurgitation is not visualized. No aortic stenosis is present. Comparison(s): No significant change from prior study. FINDINGS  Left Ventricle: Left ventricular ejection fraction, by estimation, is 60 to 65%. The left ventricle has normal function. The left ventricle has no regional wall motion abnormalities. The left ventricular internal cavity size was normal in size. There is  moderate concentric left ventricular hypertrophy. Indeterminate diastolic filling due to E-A fusion. Right Ventricle: The right ventricular size is normal. No increase in right ventricular wall thickness. Right ventricular systolic function is normal. Left Atrium: Left atrial size was normal in size. Right Atrium: Prominent Crista terminalis. Right atrial size was normal in size. Prominent Crista terminalis. Pericardium: A small pericardial effusion is present. The pericardial effusion is posterior to the left ventricle. Mitral Valve: The mitral valve is normal in structure. No evidence of mitral valve regurgitation. No evidence of mitral valve stenosis. Tricuspid Valve: The tricuspid valve is normal in structure. Tricuspid valve regurgitation is trivial. No evidence of tricuspid stenosis. Aortic Valve: The aortic valve was not well visualized. Aortic valve regurgitation is not visualized. No aortic stenosis is present. Aortic valve peak gradient measures 10.4 mmHg. Pulmonic  Valve: The pulmonic valve was normal in structure. Pulmonic valve regurgitation is trivial. No evidence of pulmonic stenosis. Aorta: The aortic root and ascending aorta are structurally normal, with no evidence of dilitation. IAS/Shunts: No atrial level shunt detected by color flow Doppler.  LEFT VENTRICLE PLAX 2D LVIDd:         3.90 cm   Diastology LVIDs:         2.70 cm   LV e' medial:    6.99 cm/s LV PW:         1.40 cm   LV E/e' medial:  11.5 LV IVS:        1.40 cm   LV e' lateral:   9.17 cm/s LVOT diam:     2.10 cm   LV E/e' lateral: 8.8 LV SV:          76 LV SV Index:   37 LVOT Area:     3.46 cm  RIGHT VENTRICLE             IVC RV S prime:     14.50 cm/s  IVC diam: 1.40 cm TAPSE (M-mode): 2.0 cm LEFT ATRIUM             Index        RIGHT ATRIUM          Index LA diam:        3.70 cm 1.80 cm/m   RA Area:     9.87 cm LA Vol (A2C):   39.7 ml 19.28 ml/m  RA Volume:   19.13 ml 9.29 ml/m LA Vol (A4C):   35.5 ml 17.24 ml/m LA Biplane Vol: 40.6 ml 19.71 ml/m  AORTIC VALVE AV Area (Vmax): 2.62 cm AV Vmax:        161.00 cm/s AV Peak Grad:   10.4 mmHg LVOT Vmax:      122.00 cm/s LVOT Vmean:     81.400 cm/s LVOT VTI:       0.220 m  AORTA Ao Root diam: 2.80 cm Ao Asc diam:  2.70 cm MITRAL VALVE               TRICUSPID VALVE MV Area (PHT): 4.06 cm    TR Peak grad:   11.4 mmHg MV Decel Time: 187 msec    TR Vmax:        169.00 cm/s MV E velocity: 80.60 cm/s MV A velocity: 76.40 cm/s  SHUNTS MV E/A ratio:  1.05        Systemic VTI:  0.22 m                            Systemic Diam: 2.10 cm Riley Lam MD Electronically signed by Riley Lam MD Signature Date/Time: 05/12/2022/11:22:29 AM    Final    CT MAXILLOFACIAL WO CONTRAST  Result Date: 05/11/2022 CLINICAL DATA:  Temporomandibular joint pain EXAM: CT MAXILLOFACIAL WITHOUT CONTRAST TECHNIQUE: Multidetector CT imaging of the maxillofacial structures was performed. Multiplanar CT image reconstructions were also generated. RADIATION DOSE REDUCTION: This exam was performed according to the departmental dose-optimization program which includes automated exposure control, adjustment of the mA and/or kV according to patient size and/or use of iterative reconstruction technique. COMPARISON:  None Available. FINDINGS: Osseous: Bony structures are within normal limits. No acute fracture or dislocation is noted. No abnormality of the TMJ joints are seen. No dental abscess or periapical lucencies are noted. Orbits: Orbits and their contents are within normal limits. Sinuses:  Paranasal sinuses are unremarkable.  Soft tissues: No soft tissue abnormality is noted. Limited intracranial: Within normal limits. IMPRESSION: No acute abnormality noted. Electronically Signed   By: Alcide Clever M.D.   On: 05/11/2022 21:49   CT Renal Stone Study  Result Date: 05/11/2022 CLINICAL DATA:  Evaluate for bowel obstruction EXAM: CT ABDOMEN AND PELVIS WITHOUT CONTRAST TECHNIQUE: Multidetector CT imaging of the abdomen and pelvis was performed following the standard protocol without IV contrast. RADIATION DOSE REDUCTION: This exam was performed according to the departmental dose-optimization program which includes automated exposure control, adjustment of the mA and/or kV according to patient size and/or use of iterative reconstruction technique. COMPARISON:  CT abdomen and pelvis 11/19/2020 FINDINGS: Lower chest: No acute abnormality. Hepatobiliary: No focal liver abnormality is seen. No gallstones, gallbladder wall thickening, or biliary dilatation. Pancreas: Unremarkable. No pancreatic ductal dilatation or surrounding inflammatory changes. Spleen: Normal in size without focal abnormality. Adrenals/Urinary Tract: Adrenal glands are unremarkable. Kidneys are normal, without renal calculi, focal lesion, or hydronephrosis. Bladder is unremarkable. Stomach/Bowel: Stomach is within normal limits. Appendix appears normal. No evidence of bowel wall thickening, distention, or inflammatory changes. Vascular/Lymphatic: No significant vascular findings are present. No enlarged abdominal or pelvic lymph nodes. Reproductive: Uterus and bilateral adnexa are unremarkable. Other: No abdominal wall hernia or abnormality. No abdominopelvic ascites. Musculoskeletal: No acute or significant osseous findings. IMPRESSION: 1. No CT evidence of acute abdominal/pelvic process. Electronically Signed   By: Darliss Cheney M.D.   On: 05/11/2022 19:18   DG Chest 2 View  Result Date: 05/11/2022 CLINICAL DATA:  Shortness of breath and dizziness EXAM: CHEST - 2 VIEW  COMPARISON:  Radiographs 04/29/2022 FINDINGS: The heart size and mediastinal contours are within normal limits. Both lungs are clear. The visualized skeletal structures are unremarkable. IMPRESSION: No active cardiopulmonary disease. Electronically Signed   By: Minerva Fester M.D.   On: 05/11/2022 17:53    Scheduled Meds:  fluticasone  1 spray Each Nare Daily   guaiFENesin  600 mg Oral BID   insulin aspart  0-6 Units Subcutaneous Q4H   insulin glargine-yfgn  35 Units Subcutaneous QHS   pantoprazole  40 mg Oral Daily   Continuous Infusions:  ampicillin-sulbactam (UNASYN) IV 3 g (05/12/22 0753)    LOS: 0 days   Marguerita Merles, DO [facility] Available via Epic secure chat 7am-7pm After these hours, please refer to coverage provider listed on [email or web address] 05/12/2022, 5:21 PM

## 2022-05-12 NOTE — Progress Notes (Signed)
Patient having tooth pain.  Patient does not want to wear CPAP tonight.

## 2022-05-13 ENCOUNTER — Inpatient Hospital Stay (HOSPITAL_COMMUNITY): Payer: Medicaid Other

## 2022-05-13 DIAGNOSIS — J123 Human metapneumovirus pneumonia: Secondary | ICD-10-CM

## 2022-05-13 DIAGNOSIS — N184 Chronic kidney disease, stage 4 (severe): Secondary | ICD-10-CM | POA: Diagnosis not present

## 2022-05-13 DIAGNOSIS — E861 Hypovolemia: Secondary | ICD-10-CM | POA: Diagnosis not present

## 2022-05-13 DIAGNOSIS — I10 Essential (primary) hypertension: Secondary | ICD-10-CM | POA: Diagnosis not present

## 2022-05-13 LAB — COMPREHENSIVE METABOLIC PANEL
ALT: 10 U/L (ref 0–44)
AST: 11 U/L — ABNORMAL LOW (ref 15–41)
Albumin: 2.4 g/dL — ABNORMAL LOW (ref 3.5–5.0)
Alkaline Phosphatase: 64 U/L (ref 38–126)
Anion gap: 6 (ref 5–15)
BUN: 35 mg/dL — ABNORMAL HIGH (ref 6–20)
CO2: 24 mmol/L (ref 22–32)
Calcium: 8.4 mg/dL — ABNORMAL LOW (ref 8.9–10.3)
Chloride: 104 mmol/L (ref 98–111)
Creatinine, Ser: 4.86 mg/dL — ABNORMAL HIGH (ref 0.44–1.00)
GFR, Estimated: 11 mL/min — ABNORMAL LOW (ref >60)
Glucose, Bld: 119 mg/dL — ABNORMAL HIGH (ref 70–99)
Potassium: 4.5 mmol/L (ref 3.5–5.1)
Sodium: 134 mmol/L — ABNORMAL LOW (ref 135–145)
Total Bilirubin: 0.5 mg/dL (ref 0.3–1.2)
Total Protein: 6.5 g/dL (ref 6.5–8.1)

## 2022-05-13 LAB — CBC WITH DIFFERENTIAL/PLATELET
Abs Immature Granulocytes: 0.02 10*3/uL (ref 0.00–0.07)
Basophils Absolute: 0 10*3/uL (ref 0.0–0.1)
Basophils Relative: 1 %
Eosinophils Absolute: 0.3 10*3/uL (ref 0.0–0.5)
Eosinophils Relative: 6 %
HCT: 26.1 % — ABNORMAL LOW (ref 36.0–46.0)
Hemoglobin: 8.4 g/dL — ABNORMAL LOW (ref 12.0–15.0)
Immature Granulocytes: 0 %
Lymphocytes Relative: 35 %
Lymphs Abs: 1.9 10*3/uL (ref 0.7–4.0)
MCH: 26.8 pg (ref 26.0–34.0)
MCHC: 32.2 g/dL (ref 30.0–36.0)
MCV: 83.4 fL (ref 80.0–100.0)
Monocytes Absolute: 0.4 10*3/uL (ref 0.1–1.0)
Monocytes Relative: 7 %
Neutro Abs: 2.7 10*3/uL (ref 1.7–7.7)
Neutrophils Relative %: 51 %
Platelets: 303 10*3/uL (ref 150–400)
RBC: 3.13 MIL/uL — ABNORMAL LOW (ref 3.87–5.11)
RDW: 13.7 % (ref 11.5–15.5)
WBC: 5.4 10*3/uL (ref 4.0–10.5)
nRBC: 0 % (ref 0.0–0.2)

## 2022-05-13 LAB — PHOSPHORUS: Phosphorus: 4.7 mg/dL — ABNORMAL HIGH (ref 2.5–4.6)

## 2022-05-13 LAB — GLUCOSE, CAPILLARY
Glucose-Capillary: 115 mg/dL — ABNORMAL HIGH (ref 70–99)
Glucose-Capillary: 95 mg/dL (ref 70–99)
Glucose-Capillary: 130 mg/dL — ABNORMAL HIGH (ref 70–99)
Glucose-Capillary: 157 mg/dL — ABNORMAL HIGH (ref 70–99)
Glucose-Capillary: 96 mg/dL (ref 70–99)
Glucose-Capillary: 161 mg/dL — ABNORMAL HIGH (ref 70–99)

## 2022-05-13 LAB — MAGNESIUM: Magnesium: 1.8 mg/dL (ref 1.7–2.4)

## 2022-05-13 MED ORDER — INSULIN ASPART 100 UNIT/ML IJ SOLN: 0.0000 [IU] | Freq: Three times a day (TID) | INTRAMUSCULAR | Status: DC

## 2022-05-13 MED ORDER — AMOXICILLIN-POT CLAVULANATE 500-125 MG PO TABS
1.0000 | ORAL_TABLET | Freq: Two times a day (BID) | ORAL | Status: DC
  Administered 2022-05-13 – 2022-05-15 (×4): 1 via ORAL
  Filled 2022-05-13 (×6): qty 1

## 2022-05-13 MED ORDER — DARBEPOETIN ALFA 40 MCG/0.4ML IJ SOSY
40.0000 ug | PREFILLED_SYRINGE | INTRAMUSCULAR | Status: DC
  Filled 2022-05-13: qty 0.4

## 2022-05-13 MED ORDER — INSULIN ASPART 100 UNIT/ML IJ SOLN
0.0000 [IU] | Freq: Every day | INTRAMUSCULAR | Status: DC
  Administered 2022-05-14: 2 [IU] via SUBCUTANEOUS

## 2022-05-13 MED ORDER — MECLIZINE HCL 25 MG PO TABS: 12.5000 mg | ORAL_TABLET | Freq: Three times a day (TID) | ORAL | Status: DC | PRN

## 2022-05-13 NOTE — Progress Notes (Signed)
Pt refused CPAP @this  time. Still complaint of wisdom tooth pain. Pt aware [facility] unit is available.

## 2022-05-13 NOTE — Plan of Care (Signed)

## 2022-05-13 NOTE — Progress Notes (Signed)
PROGRESS NOTE    Karina [name]  ZOX:[id number] DOB: September 12, 1985 DOA: 05/11/2022 PCP: Barnie Mort, PA-C   Brief Narrative:  Patient is a 37 year old morbidly obese American female with past medical history significant for uncontrolled diabetes mellitus type 2 CKD stage V, hypertension as well as other comorbidities who presented with hypotension. She was recommended to walk with. She became short of breath and dizzy and she had to sit down. She states that none of her medications and recently adjusted she had developed hypokalemia in the case of her CKD and her Lasix has been increased her spironolactone has just been stopped. She reported shortness of breath that has been progressive the last few weeks with no lower extremity edema. She denies black stools. She any chest pain but had a slight discomfort. Upon arrival to ED her blood pressure was on the lower side 102/58. Not been having good appetite given that she got Ozempic and she reports that she has had a cough for least a month or so and has been using amoxicillin prednisone. She is presented to the extraction but did not make an appointment has been taking extra Advil to help with pain. Given her dizziness and shortness of breath she presented to the ED for further evaluation. Recently her nephrologist increased her hydralazine from 25 mg twice daily to 100 twice daily and her new nephrologist has been taking her off the spironolactone. Patient states that her Lasix was increased from 20 mg to 40 mg p.o. daily and this was done 2 weeks ago. She was admitted for that her symptoms and was found to have metapneumovirus and was given gentle IV fluid hydration for hypotension. IV fluid hydration has now been held and nephrology has been consulted given her recent changes in her medications.    Assessment and Plan:  Hypotension, improved  -Due to her poor p.o. 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, with long-term current use of insulin (HCC) -Order Sensitive SSI and changed to Very Sensitive Scale AC/HS -Continue home insulin but decreased to  35units, -Checked TSH and was 1.774 and HgA1C and HbA1c was 10.2 -Hold ozempic -CBG Trend:  Recent Labs  Lab 05/12/22 1208 05/12/22 1727 05/12/22 2137 05/13/22 0010 05/13/22 0412 05/13/22 0737 05/13/22 1030  GLUCAP 136* 181* 175* 115* 95 130* 157*     CKD (chronic kidney disease), Stage IV (HCC) -Chronic  -BUN/Cr Trend: Recent Labs  Lab 04/23/22 0010 05/11/22 1625 05/11/22 2252 05/12/22 0148 05/13/22 0104  BUN 44* 38* 41* 40* 35*  CREATININE 4.95* 5.12* 4.88* 4.79* 4.86*  -IVF Hydration now stopped  -Avoid Nephrotoxic Medications such as NSAIDs, Vanomycin/Zosyn combination, Contrast Dyes, Hypotension and Dehydration to Ensure Adequate Renal Perfusion and will need to Renally Adjust Meds -Continue to Monitor and Trend Renal Function carefully and repeat CMP in the AM  -Will not give her any further Fluids and Nephrology recommends just continuing her po Lasix and no further BP meds -Appreciate Nephrology evaluation and recommendations   Postural Dizziness with presyncope and recurrent falls in the setting of ? CVA -  Due to hypotension but still persisting -Check Head CT w/o Contrast and showed "A 3 mm lacunar infarct is questioned within the right aspect of the pons (which would be new from the prior brain MRI of 07/18/2019). Consider a brain MRI for further evaluation. No acute demarcated cortical infarct.  No evidence of acute intracranial hemorrhage." -Check MRI of the Brain w/o Contrast and if + consult Neuro Formally -Check ECHO and showed "Left ventricular ejection fraction, by estimation, is 60 to 65%. The left ventricle has normal function. The left ventricle has no regional wall motion abnormalities. There is moderate concentric left ventricular  hypertrophy. Indeterminate diastolic filling due to E-A fusion. Right ventricular systolic function is normal. The right ventricular size is normal. Prominent Crista terminalis. A small pericardial effusion is present. The pericardial effusion is posterior to the left ventricle. The mitral valve is normal in structure. No evidence of mitral valve regurgitation. No evidence of mitral stenosis. The aortic valve was not well visualized. Aortic valve regurgitation is not visualized. No aortic stenosis is present. -Nephrology recommends resuming no other BP meds except the 40 mg po Lasix but may need to hold if she is still having lightheadedness and dizziness -Place TED Hose -Will Trial Low dose Meclizine 12.5 mg TIDprn -Checked TSH and was 1.774 and obtain PT/OT evaluation  and obtain Vestibular evaluation and repeat Orthostatics in the AM   Tooth infection -Severe pain and tenderness over right cheek -No fever -Able to open mouth and has no drooling -Ordered IV Unasyn and Maxillofacial CT Scan -Maxillofacial CT done and showed "Bony structures are within normal limits. No acute fracture or dislocation is noted. No abnormality of the TMJ joints are seen. No dental abscess or periapical lucencies are noted." -Pain been ongoing for the past 3 wks doubt acute surgical indication  -Will need follow up with oral surgery in the outpatient setting given no In-House Dental Coverage -Pt has no evidence of sepsis or systemic infection    Upper Airway Cough syndrome in the setting of Metapneumovirus  -Cont albuterol prn -Check resp panel and + for  Metapneumovirus -Add Guaifenesin 1200 mg po BID, Flutter Valve, and Incentive Spirometry -CXR done this Am and showed " The heart size and mediastinal contours are normal. The lungs are clear. There is no pleural effusion or pneumothorax. No acute osseous findings are identified."   OSA on CPAP -Order CPAP qhs   Hypertension -Allow permissive htn for now  Tick Exposure -Was removed off for her arm last night -Discussed with ID and does not need prophylaxis   Hypoalbuminemia -Patient's Albumin Trend Recent Labs  Lab 05/11/22 1625 05/11/22 2012 05/12/22 0148 05/13/22 0104  ALBUMIN 2.4* 2.5* 2.4* 2.4*  -Continue to Monitor and Trend and repeat CMP in the AM   Morbid Obesity -Complicates overall prognosis and care -Estimated body mass index is 43.59 kg/m as calculated from the following:   Height as of this encounter: 5\' 2"  (1.575 m).   Weight as of this encounter: 108.1 kg.  -Weight Loss and Dietary Counseling given and will need close PCP follow up    DVT prophylaxis: SCDs Start: 05/11/22 2231    Code Status: Full Code Family Communication: No family present at bedside  Disposition Plan:  Level of care: Progressive Status is: Inpatient Remains inpatient appropriate because: Still remains lightheaded and dizzy   Consultants:  Nephrology  Procedures:  ECHOCARDIOGRAM IMPRESSIONS     1. Left ventricular ejection fraction, by estimation, is 60 to 65%. The  left ventricle has normal function. The left ventricle has no regional  wall motion abnormalities. There is moderate concentric left ventricular  hypertrophy. Indeterminate diastolic  filling due to E-A fusion.   2. Right ventricular systolic function is normal. The right ventricular  size is normal.   3. Prominent Crista terminalis.   4. A small pericardial effusion is present. The pericardial effusion is  posterior to the left ventricle.   5. The mitral valve is normal in structure. No evidence of mitral  valve  regurgitation. No evidence of mitral stenosis.   6. The aortic valve was not well visualized. Aortic valve regurgitation  is not visualized. No aortic stenosis is present.   Comparison(s): No significant change from prior study.   FINDINGS   Left Ventricle: Left ventricular ejection fraction, by estimation, is 60  to 65%. The left ventricle has normal function. The left ventricle has no  regional wall motion abnormalities. The left ventricular internal cavity  size was normal in size. There is   moderate concentric left ventricular hypertrophy. Indeterminate diastolic  filling due to E-A fusion.   Right Ventricle: The right ventricular size is normal. No increase in  right ventricular wall thickness. Right ventricular systolic function is  normal.   Left Atrium: Left atrial size was normal in size.   Right Atrium: Prominent Crista terminalis. Right atrial size was normal in  size. Prominent Crista terminalis.   Pericardium: A small pericardial effusion is present. The pericardial  effusion is posterior to the left ventricle.   Mitral Valve: The mitral valve is normal in structure. No evidence of  mitral valve regurgitation. No evidence of mitral valve stenosis.   Tricuspid Valve: The tricuspid valve is normal in structure. Tricuspid  valve regurgitation is trivial. No evidence of tricuspid stenosis.   Aortic Valve: The aortic valve was not well visualized. Aortic valve  regurgitation is not visualized. No aortic stenosis is present. Aortic  valve peak gradient measures 10.4 mmHg.   Pulmonic Valve: The pulmonic valve was normal in structure. Pulmonic valve  regurgitation is trivial. No evidence of pulmonic stenosis.   Aorta: The aortic root and ascending aorta are structurally normal, with  no evidence of dilitation.   IAS/Shunts: No atrial level shunt detected by color flow Doppler.     LEFT VENTRICLE  PLAX 2D  LVIDd:         3.90 cm   Diastology  LVIDs:          2.70 cm   LV e' medial:    6.99 cm/s  LV PW:         1.40 cm   LV E/e' medial:  11.5  LV IVS:        1.40 cm   LV e' lateral:   9.17 cm/s  LVOT diam:     2.10 cm   LV E/e' lateral: 8.8  LV SV:         76  LV SV Index:   37  LVOT Area:     3.46 cm     RIGHT VENTRICLE             IVC  RV S prime:     14.50 cm/s  IVC diam: 1.40 cm  TAPSE (M-mode): 2.0 cm   LEFT ATRIUM             Index        RIGHT ATRIUM          Index  LA diam:  3.70 cm 1.80 cm/m   RA Area:     9.87 cm  LA Vol (A2C):   39.7 ml 19.28 ml/m  RA Volume:   19.13 ml 9.29 ml/m  LA Vol (A4C):   35.5 ml 17.24 ml/m  LA Biplane Vol: 40.6 ml 19.71 ml/m   AORTIC VALVE  AV Area (Vmax): 2.62 cm  AV Vmax:        161.00 cm/s  AV Peak Grad:   10.4 mmHg  LVOT Vmax:      122.00 cm/s  LVOT Vmean:     81.400 cm/s  LVOT VTI:       0.220 m    AORTA  Ao Root diam: 2.80 cm  Ao Asc diam:  2.70 cm   MITRAL VALVE               TRICUSPID VALVE  MV Area (PHT): 4.06 cm    TR Peak grad:   11.4 mmHg  MV Decel Time: 187 msec    TR Vmax:        169.00 cm/s  MV E velocity: 80.60 cm/s  MV A velocity: 76.40 cm/s  SHUNTS  MV E/A ratio:  1.05        Systemic VTI:  0.22 m                             Systemic Diam: 2.10 cm   Antimicrobials:  Anti-infectives (From admission, onward)    Start     Dose/Rate Route Frequency Ordered Stop   05/13/22 2200  amoxicillin-clavulanate (AUGMENTIN) 500-125 MG per tablet 1 tablet        1 tablet Oral Every 12 hours 05/13/22 1405 05/23/22 2159   05/11/22 2100  Ampicillin-Sulbactam (UNASYN) 3 g in sodium chloride 0.9 % 100 mL IVPB  Status:  Discontinued        3 g 200 mL/hr over 30 Minutes Intravenous Every 12 hours 05/11/22 2054 05/13/22 1405       Subjective: Karina [name] bedside and states that she is doing better and was not as lightheaded when she was resting but was lightheaded when she walked and states that she feels that she is on the schedule following impaired continues to complain of  right-sided tooth and ear pain.  No chest pain or shortness of breath.  Feels better overall and was found to have a tick on her last night.  No other concerns or complaints at this time.  Objective: Vitals:   05/13/22 0415 05/13/22 0739 05/13/22 1026 05/13/22 1307  BP: (!) 148/82 (!) 141/70 135/74   Pulse: 94 96 91   Resp: 15 19 17 14   Temp: 98.5 F (36.9 C) 98.1 F (36.7 C) 97.7 F (36.5 C)   TempSrc: Oral Oral Oral   SpO2: 96% 97% 94%   Weight:      Height:        Intake/Output Summary (Last 24 hours) at 05/13/2022 1432 Last data filed at 05/13/2022 1319 Gross per 24 hour  Intake 1493 ml  Output --  Net 1493 ml   Filed Weights   05/11/22 2045 05/11/22 2223 05/12/22 0444  Weight: 109.5 kg 109.5 kg 108.1 kg   Examination: Physical Exam:  Constitutional: WN/WD morbidly obese African-American female Respiratory: Diminished to auscultation bilaterally, no wheezing, rales, rhonchi or crackles. Normal respiratory effort and patient is not tachypenic. No accessory muscle use. Unlabored breathing  Cardiovascular: RRR, no murmurs / rubs / gallops. S1 and  S2 auscultated. Trace extremity edema. Abdomen: Soft, non-tender, distended 2/2 body habitus. Bowel sounds positive.  GU: Deferred. Musculoskeletal: No clubbing / cyanosis of digits/nails. No joint deformity upper and lower extremities.  Skin: No rashes, lesions, ulcers on a limited skin evaluation. No induration; Warm and dry.  Neurologic: CN 2-12 grossly intact with no focal deficits. Romberg sign and cerebellar reflexes not assessed.  Psychiatric: Normal judgment and insight. Alert and oriented x 3. Normal mood and appropriate affect.   Data Reviewed: I have personally reviewed following labs and imaging studies  CBC: Recent Labs  Lab 05/11/22 1625 05/11/22 2252 05/12/22 0148 05/13/22 0104  WBC 8.0 6.9 7.1 5.4  NEUTROABS 5.8 4.0  --  2.7  HGB 9.1* 8.7* 7.5* 8.4*  HCT 28.5* 25.9* 23.2* 26.1*  MCV 85.3 82.5 83.5 83.4   PLT 311 313 305 303   Basic Metabolic Panel: Recent Labs  Lab 05/11/22 1625 05/11/22 2252 05/12/22 0148 05/13/22 0104  NA 134* 133* 131* 134*  K 5.2* 4.2 4.1 4.5  CL 102 101 99 104  CO2 24 22 20* 24  GLUCOSE 234* 170* 224* 119*  BUN 38* 41* 40* 35*  CREATININE 5.12* 4.88* 4.79* 4.86*  CALCIUM 8.7* 8.6* 8.3* 8.4*  MG  --   --  1.6* 1.8  PHOS  --   --  4.7* 4.7*   GFR: Estimated Creatinine Clearance: 18.2 mL/min (A) (by C-G formula based on SCr of 4.86 mg/dL (H)). Liver Function Tests: Recent Labs  Lab 05/11/22 1625 05/11/22 2012 05/12/22 0148 05/13/22 0104  AST 15 12* 11* 11*  ALT 12 11 11 10   ALKPHOS 68 68 64 64  BILITOT 0.3 0.3 0.3 0.5  PROT 6.8 6.5 6.2* 6.5  ALBUMIN 2.4* 2.5* 2.4* 2.4*   No results for input(s): "LIPASE", "AMYLASE" in the last 168 hours. No results for input(s): "AMMONIA" in the last 168 hours. Coagulation Profile: No results for input(s): "INR", "PROTIME" in the last 168 hours. Cardiac Enzymes: Recent Labs  Lab 05/11/22 2012  CKTOTAL 197   BNP (last 3 results) No results for input(s): "PROBNP" in the last 8760 hours. HbA1C: Recent Labs    05/11/22 2252  HGBA1C 10.2*   CBG: Recent Labs  Lab 05/12/22 2137 05/13/22 0010 05/13/22 0412 05/13/22 0737 05/13/22 1030  GLUCAP 175* 115* 95 130* 157*   Lipid Profile: No results for input(s): "CHOL", "HDL", "LDLCALC", "TRIG", "CHOLHDL", "LDLDIRECT" in the last 72 hours. Thyroid Function Tests: Recent Labs    05/11/22 2012  TSH 1.774   Anemia Panel: Recent Labs    05/11/22 2252 05/12/22 0148  VITAMINB12  --  384  FOLATE  --  8.1  FERRITIN 42  --   TIBC 259  --   IRON 37  --   RETICCTPCT 1.5  --    Sepsis Labs: Recent Labs  Lab 05/11/22 2012 05/11/22 2252  LATICACIDVEN 0.7 0.6    Recent Results (from the past 240 hour(s))  Culture, group A strep     Status: None   Collection Time: 05/04/22 10:09 AM   Specimen: Throat  Result Value Ref Range Status   Specimen  Description THROAT  Final   Special Requests NONE  Final   Culture   Final    NO GROUP A STREP (S.PYOGENES) ISOLATED Performed at [facility], [address], [place], [place] [address]    Report Status 05/07/2022 FINAL  Final  SARS Coronavirus 2 by RT PCR (hospital order, performed in [facility]  hospital lab) *cepheid single result test* Anterior Nasal Swab     Status: None   Collection Time: 05/11/22 11:05 PM   Specimen: Anterior Nasal Swab  Result Value Ref Range Status   SARS Coronavirus 2 by RT PCR NEGATIVE NEGATIVE Final    Comment: Performed at [facility], [address], [place], [place] [address]  Respiratory (~20 pathogens) panel by PCR     Status: Abnormal   Collection Time: 05/11/22 11:05 PM   Specimen: Anterior Nasal Swab; Respiratory  Result Value Ref Range Status   Adenovirus NOT DETECTED NOT DETECTED Final   Coronavirus 229E NOT DETECTED NOT DETECTED Final    Comment: (NOTE) The Coronavirus on the Respiratory Panel, DOES NOT test for the novel  Coronavirus (2019 nCoV)    Coronavirus HKU1 NOT DETECTED NOT DETECTED Final   Coronavirus NL63 NOT DETECTED NOT DETECTED Final   Coronavirus OC43 NOT DETECTED NOT DETECTED Final   Metapneumovirus DETECTED (A) NOT DETECTED Final   Rhinovirus / Enterovirus NOT DETECTED NOT DETECTED Final   Influenza A NOT DETECTED NOT DETECTED Final   Influenza B NOT DETECTED NOT DETECTED Final   Parainfluenza Virus 1 NOT DETECTED NOT DETECTED Final   Parainfluenza Virus 2 NOT DETECTED NOT DETECTED Final   Parainfluenza Virus 3 NOT DETECTED NOT DETECTED Final   Parainfluenza Virus 4 NOT DETECTED NOT DETECTED Final   Respiratory Syncytial Virus NOT DETECTED NOT DETECTED Final   Bordetella pertussis NOT DETECTED NOT DETECTED Final   Bordetella Parapertussis NOT DETECTED NOT DETECTED Final   Chlamydophila pneumoniae NOT DETECTED NOT DETECTED Final   Mycoplasma pneumoniae NOT DETECTED NOT DETECTED Final    Comment: Performed at  [facility], [address], [place], [place] [address]    Radiology Studies: DG CHEST PORT 1 VIEW  Result Date: 05/13/2022 CLINICAL DATA:  Shortness of breath. EXAM: PORTABLE CHEST 1 VIEW COMPARISON:  Radiographs 05/11/2022 and 04/29/2022.  CT 10/11/2019. FINDINGS: 0618 hours. The heart size and mediastinal contours are normal. The lungs are clear. There is no pleural effusion or pneumothorax. No acute osseous findings are identified. IMPRESSION: No active disease. Electronically Signed   By: Carey Bullocks M.D.   On: 05/13/2022 08:32   ECHOCARDIOGRAM COMPLETE  Result Date: 05/12/2022    ECHOCARDIOGRAM REPORT   Patient Name:   Karina [name] Date of Exam: 05/12/2022 Medical Rec #:  [id number]        Height:       62.0 in Accession #:    [id number]       Weight:       238.3 lb Date of Birth:  1985/10/02        BSA:          2.059 m Patient Age:    36 years         BP:           142/75 mmHg Patient Gender: F                HR:           96 bpm. Exam Location:  Inpatient Procedure: 2D Echo, Cardiac Doppler and Color Doppler Indications:    Elevated Troponin, Syncope R55  History:        Patient has prior history of Echocardiogram examinations, most                 recent 08/09/2019. Arrythmias:Tachycardia, Signs/Symptoms:Dyspnea  and Shortness of Breath; Risk Factors:Hypertension, Diabetes and                 Sleep Apnea. CKD, stage 3.  Sonographer:    Lucendia Herrlich Referring Phys: [id number] ANASTASSIA DOUTOVA IMPRESSIONS  1. Left ventricular ejection fraction, by estimation, is 60 to 65%. The left ventricle has normal function. The left ventricle has no regional wall motion abnormalities. There is moderate concentric left ventricular hypertrophy. Indeterminate diastolic filling due to E-A fusion.  2. Right ventricular systolic function is normal. The right ventricular size is normal.  3. Prominent Crista terminalis.  4. A small pericardial effusion is present. The pericardial effusion is  posterior to the left ventricle.  5. The mitral valve is normal in structure. No evidence of mitral valve regurgitation. No evidence of mitral stenosis.  6. The aortic valve was not well visualized. Aortic valve regurgitation is not visualized. No aortic stenosis is present. Comparison(s): No significant change from prior study. FINDINGS  Left Ventricle: Left ventricular ejection fraction, by estimation, is 60 to 65%. The left ventricle has normal function. The left ventricle has no regional wall motion abnormalities. The left ventricular internal cavity size was normal in size. There is  moderate concentric left ventricular hypertrophy. Indeterminate diastolic filling due to E-A fusion. Right Ventricle: The right ventricular size is normal. No increase in right ventricular wall thickness. Right ventricular systolic function is normal. Left Atrium: Left atrial size was normal in size. Right Atrium: Prominent Crista terminalis. Right atrial size was normal in size. Prominent Crista terminalis. Pericardium: A small pericardial effusion is present. The pericardial effusion is posterior to the left ventricle. Mitral Valve: The mitral valve is normal in structure. No evidence of mitral valve regurgitation. No evidence of mitral valve stenosis. Tricuspid Valve: The tricuspid valve is normal in structure. Tricuspid valve regurgitation is trivial. No evidence of tricuspid stenosis. Aortic Valve: The aortic valve was not well visualized. Aortic valve regurgitation is not visualized. No aortic stenosis is present. Aortic valve peak gradient measures 10.4 mmHg. Pulmonic Valve: The pulmonic valve was normal in structure. Pulmonic valve regurgitation is trivial. No evidence of pulmonic stenosis. Aorta: The aortic root and ascending aorta are structurally normal, with no evidence of dilitation. IAS/Shunts: No atrial level shunt detected by color flow Doppler.  LEFT VENTRICLE PLAX 2D LVIDd:         3.90 cm   Diastology LVIDs:          2.70 cm   LV e' medial:    6.99 cm/s LV PW:         1.40 cm   LV E/e' medial:  11.5 LV IVS:        1.40 cm   LV e' lateral:   9.17 cm/s LVOT diam:     2.10 cm   LV E/e' lateral: 8.8 LV SV:         76 LV SV Index:   37 LVOT Area:     3.46 cm  RIGHT VENTRICLE             IVC RV S prime:     14.50 cm/s  IVC diam: 1.40 cm TAPSE (M-mode): 2.0 cm LEFT ATRIUM             Index        RIGHT ATRIUM          Index LA diam:        3.70 cm 1.80 cm/m   RA Area:  9.87 cm LA Vol (A2C):   39.7 ml 19.28 ml/m  RA Volume:   19.13 ml 9.29 ml/m LA Vol (A4C):   35.5 ml 17.24 ml/m LA Biplane Vol: 40.6 ml 19.71 ml/m  AORTIC VALVE AV Area (Vmax): 2.62 cm AV Vmax:        161.00 cm/s AV Peak Grad:   10.4 mmHg LVOT Vmax:      122.00 cm/s LVOT Vmean:     81.400 cm/s LVOT VTI:       0.220 m  AORTA Ao Root diam: 2.80 cm Ao Asc diam:  2.70 cm MITRAL VALVE               TRICUSPID VALVE MV Area (PHT): 4.06 cm    TR Peak grad:   11.4 mmHg MV Decel Time: 187 msec    TR Vmax:        169.00 cm/s MV E velocity: 80.60 cm/s MV A velocity: 76.40 cm/s  SHUNTS MV E/A ratio:  1.05        Systemic VTI:  0.22 m                            Systemic Diam: 2.10 cm Riley Lam MD Electronically signed by Riley Lam MD Signature Date/Time: 05/12/2022/11:22:29 AM    Final    CT MAXILLOFACIAL WO CONTRAST  Result Date: 05/11/2022 CLINICAL DATA:  Temporomandibular joint pain EXAM: CT MAXILLOFACIAL WITHOUT CONTRAST TECHNIQUE: Multidetector CT imaging of the maxillofacial structures was performed. Multiplanar CT image reconstructions were also generated. RADIATION DOSE REDUCTION: This exam was performed according to the departmental dose-optimization program which includes automated exposure control, adjustment of the mA and/or kV according to patient size and/or use of iterative reconstruction technique. COMPARISON:  None Available. FINDINGS: Osseous: Bony structures are within normal limits. No acute fracture or dislocation is noted. No  abnormality of the TMJ joints are seen. No dental abscess or periapical lucencies are noted. Orbits: Orbits and their contents are within normal limits. Sinuses: Paranasal sinuses are unremarkable. Soft tissues: No soft tissue abnormality is noted. Limited intracranial: Within normal limits. IMPRESSION: No acute abnormality noted. Electronically Signed   By: Alcide Clever M.D.   On: 05/11/2022 21:49   CT Renal Stone Study  Result Date: 05/11/2022 CLINICAL DATA:  Evaluate for bowel obstruction EXAM: CT ABDOMEN AND PELVIS WITHOUT CONTRAST TECHNIQUE: Multidetector CT imaging of the abdomen and pelvis was performed following the standard protocol without IV contrast. RADIATION DOSE REDUCTION: This exam was performed according to the departmental dose-optimization program which includes automated exposure control, adjustment of the mA and/or kV according to patient size and/or use of iterative reconstruction technique. COMPARISON:  CT abdomen and pelvis 11/19/2020 FINDINGS: Lower chest: No acute abnormality. Hepatobiliary: No focal liver abnormality is seen. No gallstones, gallbladder wall thickening, or biliary dilatation. Pancreas: Unremarkable. No pancreatic ductal dilatation or surrounding inflammatory changes. Spleen: Normal in size without focal abnormality. Adrenals/Urinary Tract: Adrenal glands are unremarkable. Kidneys are normal, without renal calculi, focal lesion, or hydronephrosis. Bladder is unremarkable. Stomach/Bowel: Stomach is within normal limits. Appendix appears normal. No evidence of bowel wall thickening, distention, or inflammatory changes. Vascular/Lymphatic: No significant vascular findings are present. No enlarged abdominal or pelvic lymph nodes. Reproductive: Uterus and bilateral adnexa are unremarkable. Other: No abdominal wall hernia or abnormality. No abdominopelvic ascites. Musculoskeletal: No acute or significant osseous findings. IMPRESSION: 1. No CT evidence of acute abdominal/pelvic  process. Electronically Signed   By: Linton Rump  Malena Edman M.D.   On: 05/11/2022 19:18   DG Chest 2 View  Result Date: 05/11/2022 CLINICAL DATA:  Shortness of breath and dizziness EXAM: CHEST - 2 VIEW COMPARISON:  Radiographs 04/29/2022 FINDINGS: The heart size and mediastinal contours are within normal limits. Both lungs are clear. The visualized skeletal structures are unremarkable. IMPRESSION: No active cardiopulmonary disease. Electronically Signed   By: Minerva Fester M.D.   On: 05/11/2022 17:53    Scheduled Meds:  amoxicillin-clavulanate  1 tablet Oral Q12H   darbepoetin (ARANESP) injection - NON-DIALYSIS  40 mcg Subcutaneous Q Thu-1800   fluticasone  1 spray Each Nare Daily   furosemide  40 mg Oral Daily   guaiFENesin  600 mg Oral BID   insulin aspart  0-5 Units Subcutaneous QHS   insulin aspart  0-6 Units Subcutaneous TID WC   insulin glargine-yfgn  35 Units Subcutaneous QHS   pantoprazole  40 mg Oral Daily   sodium bicarbonate  650 mg Oral BID   Continuous Infusions:   LOS: 1 day   Marguerita Merles, DO [facility] Available via Epic secure chat 7am-7pm After these hours, please refer to coverage provider listed on [email or web address] 05/13/2022, 2:32 PM

## 2022-05-13 NOTE — Progress Notes (Signed)
Patient ID: Karina [name], female   DOB: [birth year], 37 y.o.   MRN: [id number] S: No complaints O:BP 135/74   Pulse 91   Temp 97.7 F (36.5 C) (Oral)   Resp 17   Ht 5\' 2"  (1.575 m)   Wt 108.1 kg   LMP 04/18/2022 (Exact Date)   SpO2 94%   BMI 43.59 kg/m   Intake/Output Summary (Last 24 hours) at 05/13/2022 1136 Last data filed at 05/13/2022 0901 Gross per 24 hour  Intake 1256 ml  Output --  Net 1256 ml   Intake/Output: I/O last 3 completed shifts: In: 1700 [P.O.:900; IV Piggyback:800] Out: 650 [Urine:650]  Intake/Output this shift:  Total I/O In: 476 [P.O.:476] Out: -  Weight change:  Gen: NAD CVS: RRR Resp: CTA Abd: +BS, soft, NT/ND Ext: no edema  Recent Labs  Lab 05/11/22 1625 05/11/22 2012 05/11/22 2252 05/12/22 0148 05/13/22 0104  NA 134*  --  133* 131* 134*  K 5.2*  --  4.2 4.1 4.5  CL 102  --  101 99 104  CO2 24  --  22 20* 24  GLUCOSE 234*  --  170* 224* 119*  BUN 38*  --  41* 40* 35*  CREATININE 5.12*  --  4.88* 4.79* 4.86*  ALBUMIN 2.4* 2.5*  --  2.4* 2.4*  CALCIUM 8.7*  --  8.6* 8.3* 8.4*  PHOS  --   --   --  4.7* 4.7*  AST 15 12*  --  11* 11*  ALT 12 11  --  11 10   Liver Function Tests: Recent Labs  Lab 05/11/22 2012 05/12/22 0148 05/13/22 0104  AST 12* 11* 11*  ALT 11 11 10   ALKPHOS 68 64 64  BILITOT 0.3 0.3 0.5  PROT 6.5 6.2* 6.5  ALBUMIN 2.5* 2.4* 2.4*   No results for input(s): "LIPASE", "AMYLASE" in the last 168 hours. No results for input(s): "AMMONIA" in the last 168 hours. CBC: Recent Labs  Lab 05/11/22 1625 05/11/22 2252 05/12/22 0148 05/13/22 0104  WBC 8.0 6.9 7.1 5.4  NEUTROABS 5.8 4.0  --  2.7  HGB 9.1* 8.7* 7.5* 8.4*  HCT 28.5* 25.9* 23.2* 26.1*  MCV 85.3 82.5 83.5 83.4  PLT 311 313 305 303   Cardiac Enzymes: Recent Labs  Lab 05/11/22 2012  CKTOTAL 197   CBG: Recent Labs  Lab 05/12/22 2137 05/13/22 0010 05/13/22 0412 05/13/22 0737 05/13/22 1030  GLUCAP 175* 115* 95 130* 157*    Iron Studies:   Recent Labs    05/11/22 2252  IRON 37  TIBC 259  FERRITIN 42   Studies/Results: DG CHEST PORT 1 VIEW  Result Date: 05/13/2022 CLINICAL DATA:  Shortness of breath. EXAM: PORTABLE CHEST 1 VIEW COMPARISON:  Radiographs 05/11/2022 and 04/29/2022.  CT 10/11/2019. FINDINGS: 0618 hours. The heart size and mediastinal contours are normal. The lungs are clear. There is no pleural effusion or pneumothorax. No acute osseous findings are identified. IMPRESSION: No active disease. Electronically Signed   By: Carey Bullocks M.D.   On: 05/13/2022 08:32   ECHOCARDIOGRAM COMPLETE  Result Date: 05/12/2022    ECHOCARDIOGRAM REPORT   Patient Name:   Karina [name] Date of Exam: 05/12/2022 Medical Rec #:  [id number]        Height:       62.0 in Accession #:    [id number]       Weight:       238.3 lb Date of Birth:  04/23/1985  BSA:          2.059 m Patient Age:    36 years         BP:           142/75 mmHg Patient Gender: F                HR:           96 bpm. Exam Location:  Inpatient Procedure: 2D Echo, Cardiac Doppler and Color Doppler Indications:    Elevated Troponin, Syncope R55  History:        Patient has prior history of Echocardiogram examinations, most                 recent 08/09/2019. Arrythmias:Tachycardia, Signs/Symptoms:Dyspnea                 and Shortness of Breath; Risk Factors:Hypertension, Diabetes and                 Sleep Apnea. CKD, stage 3.  Sonographer:    Lucendia Herrlich Referring Phys: [id number] ANASTASSIA DOUTOVA IMPRESSIONS  1. Left ventricular ejection fraction, by estimation, is 60 to 65%. The left ventricle has normal function. The left ventricle has no regional wall motion abnormalities. There is moderate concentric left ventricular hypertrophy. Indeterminate diastolic filling due to E-A fusion.  2. Right ventricular systolic function is normal. The right ventricular size is normal.  3. Prominent Crista terminalis.  4. A small pericardial effusion is present. The pericardial effusion is  posterior to the left ventricle.  5. The mitral valve is normal in structure. No evidence of mitral valve regurgitation. No evidence of mitral stenosis.  6. The aortic valve was not well visualized. Aortic valve regurgitation is not visualized. No aortic stenosis is present. Comparison(s): No significant change from prior study. FINDINGS  Left Ventricle: Left ventricular ejection fraction, by estimation, is 60 to 65%. The left ventricle has normal function. The left ventricle has no regional wall motion abnormalities. The left ventricular internal cavity size was normal in size. There is  moderate concentric left ventricular hypertrophy. Indeterminate diastolic filling due to E-A fusion. Right Ventricle: The right ventricular size is normal. No increase in right ventricular wall thickness. Right ventricular systolic function is normal. Left Atrium: Left atrial size was normal in size. Right Atrium: Prominent Crista terminalis. Right atrial size was normal in size. Prominent Crista terminalis. Pericardium: A small pericardial effusion is present. The pericardial effusion is posterior to the left ventricle. Mitral Valve: The mitral valve is normal in structure. No evidence of mitral valve regurgitation. No evidence of mitral valve stenosis. Tricuspid Valve: The tricuspid valve is normal in structure. Tricuspid valve regurgitation is trivial. No evidence of tricuspid stenosis. Aortic Valve: The aortic valve was not well visualized. Aortic valve regurgitation is not visualized. No aortic stenosis is present. Aortic valve peak gradient measures 10.4 mmHg. Pulmonic Valve: The pulmonic valve was normal in structure. Pulmonic valve regurgitation is trivial. No evidence of pulmonic stenosis. Aorta: The aortic root and ascending aorta are structurally normal, with no evidence of dilitation. IAS/Shunts: No atrial level shunt detected by color flow Doppler.  LEFT VENTRICLE PLAX 2D LVIDd:         3.90 cm   Diastology LVIDs:          2.70 cm   LV e' medial:    6.99 cm/s LV PW:         1.40 cm   LV E/e' medial:  11.5 LV IVS:  1.40 cm   LV e' lateral:   9.17 cm/s LVOT diam:     2.10 cm   LV E/e' lateral: 8.8 LV SV:         76 LV SV Index:   37 LVOT Area:     3.46 cm  RIGHT VENTRICLE             IVC RV S prime:     14.50 cm/s  IVC diam: 1.40 cm TAPSE (M-mode): 2.0 cm LEFT ATRIUM             Index        RIGHT ATRIUM          Index LA diam:        3.70 cm 1.80 cm/m   RA Area:     9.87 cm LA Vol (A2C):   39.7 ml 19.28 ml/m  RA Volume:   19.13 ml 9.29 ml/m LA Vol (A4C):   35.5 ml 17.24 ml/m LA Biplane Vol: 40.6 ml 19.71 ml/m  AORTIC VALVE AV Area (Vmax): 2.62 cm AV Vmax:        161.00 cm/s AV Peak Grad:   10.4 mmHg LVOT Vmax:      122.00 cm/s LVOT Vmean:     81.400 cm/s LVOT VTI:       0.220 m  AORTA Ao Root diam: 2.80 cm Ao Asc diam:  2.70 cm MITRAL VALVE               TRICUSPID VALVE MV Area (PHT): 4.06 cm    TR Peak grad:   11.4 mmHg MV Decel Time: 187 msec    TR Vmax:        169.00 cm/s MV E velocity: 80.60 cm/s MV A velocity: 76.40 cm/s  SHUNTS MV E/A ratio:  1.05        Systemic VTI:  0.22 m                            Systemic Diam: 2.10 cm Riley Lam MD Electronically signed by Riley Lam MD Signature Date/Time: 05/12/2022/11:22:29 AM    Final    CT MAXILLOFACIAL WO CONTRAST  Result Date: 05/11/2022 CLINICAL DATA:  Temporomandibular joint pain EXAM: CT MAXILLOFACIAL WITHOUT CONTRAST TECHNIQUE: Multidetector CT imaging of the maxillofacial structures was performed. Multiplanar CT image reconstructions were also generated. RADIATION DOSE REDUCTION: This exam was performed according to the departmental dose-optimization program which includes automated exposure control, adjustment of the mA and/or kV according to patient size and/or use of iterative reconstruction technique. COMPARISON:  None Available. FINDINGS: Osseous: Bony structures are within normal limits. No acute fracture or dislocation is noted. No  abnormality of the TMJ joints are seen. No dental abscess or periapical lucencies are noted. Orbits: Orbits and their contents are within normal limits. Sinuses: Paranasal sinuses are unremarkable. Soft tissues: No soft tissue abnormality is noted. Limited intracranial: Within normal limits. IMPRESSION: No acute abnormality noted. Electronically Signed   By: Alcide Clever M.D.   On: 05/11/2022 21:49   CT Renal Stone Study  Result Date: 05/11/2022 CLINICAL DATA:  Evaluate for bowel obstruction EXAM: CT ABDOMEN AND PELVIS WITHOUT CONTRAST TECHNIQUE: Multidetector CT imaging of the abdomen and pelvis was performed following the standard protocol without IV contrast. RADIATION DOSE REDUCTION: This exam was performed according to the departmental dose-optimization program which includes automated exposure control, adjustment of the mA and/or kV according to patient size and/or use of iterative reconstruction  technique. COMPARISON:  CT abdomen and pelvis 11/19/2020 FINDINGS: Lower chest: No acute abnormality. Hepatobiliary: No focal liver abnormality is seen. No gallstones, gallbladder wall thickening, or biliary dilatation. Pancreas: Unremarkable. No pancreatic ductal dilatation or surrounding inflammatory changes. Spleen: Normal in size without focal abnormality. Adrenals/Urinary Tract: Adrenal glands are unremarkable. Kidneys are normal, without renal calculi, focal lesion, or hydronephrosis. Bladder is unremarkable. Stomach/Bowel: Stomach is within normal limits. Appendix appears normal. No evidence of bowel wall thickening, distention, or inflammatory changes. Vascular/Lymphatic: No significant vascular findings are present. No enlarged abdominal or pelvic lymph nodes. Reproductive: Uterus and bilateral adnexa are unremarkable. Other: No abdominal wall hernia or abnormality. No abdominopelvic ascites. Musculoskeletal: No acute or significant osseous findings. IMPRESSION: 1. No CT evidence of acute abdominal/pelvic  process. Electronically Signed   By: Darliss Cheney M.D.   On: 05/11/2022 19:18   DG Chest 2 View  Result Date: 05/11/2022 CLINICAL DATA:  Shortness of breath and dizziness EXAM: CHEST - 2 VIEW COMPARISON:  Radiographs 04/29/2022 FINDINGS: The heart size and mediastinal contours are within normal limits. Both lungs are clear. The visualized skeletal structures are unremarkable. IMPRESSION: No active cardiopulmonary disease. Electronically Signed   By: Minerva Fester M.D.   On: 05/11/2022 17:53    fluticasone  1 spray Each Nare Daily   furosemide  40 mg Oral Daily   guaiFENesin  600 mg Oral BID   insulin aspart  0-6 Units Subcutaneous Q4H   insulin glargine-yfgn  35 Units Subcutaneous QHS   pantoprazole  40 mg Oral Daily   sodium bicarbonate  650 mg Oral BID    BMET    Component Value Date/Time   NA 134 (L) 05/13/2022 0104   NA 136 08/31/2019 1425   K 4.5 05/13/2022 0104   CL 104 05/13/2022 0104   CO2 24 05/13/2022 0104   GLUCOSE 119 (H) 05/13/2022 0104   BUN 35 (H) 05/13/2022 0104   BUN 20 08/31/2019 1425   CREATININE 4.86 (H) 05/13/2022 0104   CREATININE 0.93 03/21/2015 1230   CALCIUM 8.4 (L) 05/13/2022 0104   GFRNONAA 11 (L) 05/13/2022 0104   GFRNONAA 82 03/21/2015 1230   GFRAA 48 (L) 10/10/2019 2133   GFRAA >89 03/21/2015 1230   CBC    Component Value Date/Time   WBC 5.4 05/13/2022 0104   RBC 3.13 (L) 05/13/2022 0104   HGB 8.4 (L) 05/13/2022 0104   HGB 11.2 08/25/2018 1102   HCT 26.1 (L) 05/13/2022 0104   HCT 35.2 08/25/2018 1102   PLT 303 05/13/2022 0104   PLT 410 08/25/2018 1102   MCV 83.4 05/13/2022 0104   MCV 82 08/25/2018 1102   MCH 26.8 05/13/2022 0104   MCHC 32.2 05/13/2022 0104   RDW 13.7 05/13/2022 0104   RDW 13.3 08/25/2018 1102   LYMPHSABS 1.9 05/13/2022 0104   MONOABS 0.4 05/13/2022 0104   EOSABS 0.3 05/13/2022 0104   BASOSABS 0.0 05/13/2022 0104     Assessment/Plan:  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  Scr stable, no uremic symptoms.  I did  discuss with her the need to have vascular access placement in the near future and to explore referral for transplant evaluation with Dr. Janit Pagan at her next visit this month. Hypotension - likely due to volume depletion after increasing dose of furosemide and poor po intake.  Also recent increase in hydralazine dose to 100 mg tid.  Feels better.  S/p IVF's and bp meds on hold.  Bp improved.  F/u with her  primary to start slowly adding bp medications back and follow response.  Follow orthostatic vitals. Anemia of CKD stage V - on IV iron and will start ESA.  She will need to have this arranged as an outpatient with her primary nephrologist, Dr. Janit Pagan. OSA on CPAP Metabolic acidosis - on sodium bicarb  Irena Cords, MD [facility]

## 2022-05-14 ENCOUNTER — Inpatient Hospital Stay (HOSPITAL_COMMUNITY): Payer: Medicaid Other

## 2022-05-14 DIAGNOSIS — I10 Essential (primary) hypertension: Secondary | ICD-10-CM | POA: Diagnosis not present

## 2022-05-14 DIAGNOSIS — R42 Dizziness and giddiness: Secondary | ICD-10-CM | POA: Diagnosis not present

## 2022-05-14 DIAGNOSIS — K047 Periapical abscess without sinus: Secondary | ICD-10-CM | POA: Diagnosis not present

## 2022-05-14 DIAGNOSIS — E861 Hypovolemia: Secondary | ICD-10-CM | POA: Diagnosis not present

## 2022-05-14 LAB — COMPREHENSIVE METABOLIC PANEL
ALT: 12 U/L (ref 0–44)
AST: 13 U/L — ABNORMAL LOW (ref 15–41)
Albumin: 2.4 g/dL — ABNORMAL LOW (ref 3.5–5.0)
Alkaline Phosphatase: 59 U/L (ref 38–126)
Anion gap: 8 (ref 5–15)
BUN: 39 mg/dL — ABNORMAL HIGH (ref 6–20)
CO2: 22 mmol/L (ref 22–32)
Calcium: 8.4 mg/dL — ABNORMAL LOW (ref 8.9–10.3)
Chloride: 102 mmol/L (ref 98–111)
Creatinine, Ser: 5.21 mg/dL — ABNORMAL HIGH (ref 0.44–1.00)
GFR, Estimated: 10 mL/min — ABNORMAL LOW (ref >60)
Glucose, Bld: 130 mg/dL — ABNORMAL HIGH (ref 70–99)
Potassium: 4.4 mmol/L (ref 3.5–5.1)
Sodium: 132 mmol/L — ABNORMAL LOW (ref 135–145)
Total Bilirubin: 0.4 mg/dL (ref 0.3–1.2)
Total Protein: 6.4 g/dL — ABNORMAL LOW (ref 6.5–8.1)

## 2022-05-14 LAB — CBC WITH DIFFERENTIAL/PLATELET
Abs Immature Granulocytes: 0.03 10*3/uL (ref 0.00–0.07)
Basophils Absolute: 0 10*3/uL (ref 0.0–0.1)
Basophils Relative: 1 %
Eosinophils Absolute: 0.3 10*3/uL (ref 0.0–0.5)
Eosinophils Relative: 5 %
HCT: 26.7 % — ABNORMAL LOW (ref 36.0–46.0)
Hemoglobin: 8.4 g/dL — ABNORMAL LOW (ref 12.0–15.0)
Immature Granulocytes: 1 %
Lymphocytes Relative: 29 %
Lymphs Abs: 1.8 10*3/uL (ref 0.7–4.0)
MCH: 26.9 pg (ref 26.0–34.0)
MCHC: 31.5 g/dL (ref 30.0–36.0)
MCV: 85.6 fL (ref 80.0–100.0)
Monocytes Absolute: 0.4 10*3/uL (ref 0.1–1.0)
Monocytes Relative: 7 %
Neutro Abs: 3.7 10*3/uL (ref 1.7–7.7)
Neutrophils Relative %: 57 %
Platelets: 295 10*3/uL (ref 150–400)
RBC: 3.12 MIL/uL — ABNORMAL LOW (ref 3.87–5.11)
RDW: 13.6 % (ref 11.5–15.5)
WBC: 6.4 10*3/uL (ref 4.0–10.5)
nRBC: 0 % (ref 0.0–0.2)

## 2022-05-14 LAB — GLUCOSE, CAPILLARY
Glucose-Capillary: 108 mg/dL — ABNORMAL HIGH (ref 70–99)
Glucose-Capillary: 132 mg/dL — ABNORMAL HIGH (ref 70–99)
Glucose-Capillary: 141 mg/dL — ABNORMAL HIGH (ref 70–99)
Glucose-Capillary: 205 mg/dL — ABNORMAL HIGH (ref 70–99)
Glucose-Capillary: 93 mg/dL (ref 70–99)
Glucose-Capillary: 95 mg/dL (ref 70–99)

## 2022-05-14 LAB — MAGNESIUM: Magnesium: 1.7 mg/dL (ref 1.7–2.4)

## 2022-05-14 LAB — PHOSPHORUS: Phosphorus: 4.9 mg/dL — ABNORMAL HIGH (ref 2.5–4.6)

## 2022-05-14 MED ORDER — HYDRALAZINE HCL 20 MG/ML IJ SOLN
10.0000 mg | Freq: Four times a day (QID) | INTRAMUSCULAR | Status: DC | PRN
  Administered 2022-05-14 – 2022-05-15 (×2): 10 mg via INTRAVENOUS
  Filled 2022-05-14 (×2): qty 1

## 2022-05-14 MED ORDER — PROCHLORPERAZINE EDISYLATE 10 MG/2ML IJ SOLN
10.0000 mg | Freq: Four times a day (QID) | INTRAMUSCULAR | Status: DC | PRN
  Administered 2022-05-15: 10 mg via INTRAVENOUS
  Filled 2022-05-14: qty 2

## 2022-05-14 MED ORDER — MECLIZINE HCL 25 MG PO TABS
25.0000 mg | ORAL_TABLET | Freq: Three times a day (TID) | ORAL | Status: DC | PRN
  Administered 2022-05-14: 25 mg via ORAL
  Filled 2022-05-14: qty 1

## 2022-05-14 MED ORDER — SODIUM CHLORIDE 0.9 % IV SOLN: INTRAVENOUS | Status: DC

## 2022-05-14 MED ORDER — SENNA 8.6 MG PO TABS
1.0000 | ORAL_TABLET | Freq: Every day | ORAL | Status: DC | PRN
  Administered 2022-05-15: 8.6 mg via ORAL
  Filled 2022-05-14: qty 1

## 2022-05-14 NOTE — Progress Notes (Signed)
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 She was recommended to walk with. She became short of breath and dizzy and she had to sit down. She states that none of her medications and recently adjusted she had developed hypokalemia in the case of her CKD and her Lasix has been increased her spironolactone has just been stopped. She reported shortness of breath that has been progressive the last few weeks with no lower extremity edema. She denies black stools. She any chest pain but had a slight discomfort. Upon arrival to ED her blood pressure was on the lower side 102/58. Not been having good appetite given that she got Ozempic and she reports that she has had a cough for least a month or so and has been using amoxicillin prednisone. She is presented to the extraction but did not make an appointment has been taking extra Advil to help with pain. Given her dizziness and shortness of breath she presented to the ED for further evaluation. Recently her nephrologist increased her hydralazine from 25 mg twice daily to 100 twice daily and her new nephrologist has been taking her off the spironolactone. Patient states that her Lasix was increased from 20 mg to 40 mg p.o. daily and this was done 2 weeks ago. She was admitted for that her symptoms and was found to have metapneumovirus and was given gentle IV fluid hydration for hypotension. IV fluid hydration has now been held and nephrology has been consulted given her recent changes in her medications.    Patient continues to be dizzy, nauseous and lightheaded and complains of a headache still.  Will make further medication adjustments and MRI was discussed  with the neurologist and neurologist recommended trying Compazine for her nausea and lightheadedness.  Will go up on her meclizine dose and add IV hydralazine for elevated blood pressure.  Will continue pain regimen and antibiotics for her tooth infection.  Assessment and Plan:  Hypotension, improved and now Hypertensive -Due to her poor p.o. 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, with long-term current use of insulin (HCC) -Order Sensitive SSI and changed to Very Sensitive Scale AC/HS -Continue home insulin but decreased to  35units, -Checked TSH and was 1.774 and HgA1C and HbA1c was 10.2 -Hold ozempic -CBG Trend:  Recent Labs  Lab 05/13/22 1625 05/13/22 2103 05/14/22 0016 05/14/22 0413 05/14/22 0649 05/14/22 1058 05/14/22 1621  GLUCAP 96 161* 141* 95 93 108* 132*     CKD (chronic kidney disease), Stage IV (HCC) -Chronic  -BUN/Cr Trend: Recent Labs  Lab 04/23/22 0010 05/11/22 1625 05/11/22 2252 05/12/22 0148 05/13/22 0104 05/14/22 0036  BUN 44* 38* 41* 40* 35* 39*  CREATININE 4.95* 5.12* 4.88* 4.79* 4.86* 5.21*  -IVF Hydration now stopped  -Avoid Nephrotoxic Medications such as NSAIDs, Vanomycin/Zosyn combination, Contrast Dyes, Hypotension and Dehydration to Ensure Adequate  Renal Perfusion and will need to Renally Adjust Meds -Continue to Monitor and Trend Renal Function carefully and repeat CMP in the AM  -Will not give her any further Fluids and Nephrology recommends just continuing her po Lasix and no further BP meds -Appreciate Nephrology  evaluation and recommendations   Postural Dizziness and Nausea with presyncope and recurrent falls  Headache -Hypotension is improved now -Check Head CT w/o Contrast and showed "A 3 mm lacunar infarct is questioned within the right aspect of the pons (which would be new from the prior brain MRI of 07/18/2019). Consider a brain MRI for further evaluation. No acute demarcated cortical infarct. No evidence of acute intracranial hemorrhage." -Check MRI of the Brain w/o Contrast and if + consult Neuro Formally; MRI done and showed "No acute intracranial abnormality and normal brainstem, artifact there on the Head CT yesterday. However, suggestion of scattered chronic microhemorrhages in both cerebral hemispheres on T2* imaging today, chronic but increased compared to 2021 susceptibility weighted imaging. This is nonspecific but favor sequelae of small vessel disease." -Check ECHO and showed "Left ventricular ejection fraction, by estimation, is 60 to 65%. The left ventricle has normal function. The left ventricle has no regional wall motion abnormalities. There is moderate concentric left ventricular  hypertrophy. Indeterminate diastolic filling due to E-A fusion. Right ventricular systolic function is normal. The right ventricular size is normal. Prominent Crista terminalis. A small pericardial effusion is present. The pericardial effusion is posterior to the left ventricle. The mitral valve is normal in structure. No evidence of mitral valve regurgitation. No evidence of mitral stenosis. The aortic valve was not well visualized. Aortic valve regurgitation is not visualized. No aortic stenosis is present. -Nephrology recommends resuming no other BP meds except the 40 mg po Lasix but may need to hold if she is still having lightheadedness and dizziness -Place TED Hose -Will Trial Low dose Meclizine 12.5 mg TIDprn and increase to 25 mg TIDprn -Add Compazine 10 mg IV q6hprn Refractory N/V -Checked TSH and was  1.774 and obtain PT/OT evaluation  and obtain Vestibular evaluation and repeat Orthostatics in the AM -Patient was a little orthostatic and remains Dizzy and lightheaded -Vestibular Evaluation done and she had some hypofunction on the Right and PT recommending outpatient Vestibular PT -Continue to Adjust meds    Tooth infection -Severe pain and tenderness over right cheek -No fever -Able to open mouth and has no drooling -Ordered IV Unasyn and Maxillofacial CT Scan -Maxillofacial CT done and showed "Bony structures are within normal limits. No acute fracture or dislocation is noted. No abnormality of the TMJ joints are seen. No dental abscess or periapical lucencies are noted." -Pain been ongoing for the past 3 wks doubt acute surgical indication  -Will need follow up with oral surgery in the outpatient setting given no In-House Dental Coverage -Pt has no evidence of sepsis or systemic infection but continues to have a headache and jaw fullness on the Right   Upper Airway Cough syndrome in the setting of Metapneumovirus  -Cont albuterol prn -Check resp panel and + for Metapneumovirus -Add Guaifenesin 1200 mg po BID, Flutter Valve, and Incentive Spirometry -CXR done this Am and showed " The heart size and mediastinal contours are normal. The lungs are clear. There is no pleural effusion or pneumothorax. No acute osseous findings are identified."   OSA on CPAP -Order CPAP qhs   Hypertension -Allowed for Permissive HTN but not BP elevated -C/w po Lasix and will add IV Hydralazine -C/w  Pain Control -Continue to Monitor BP per Protocol -Last BP was elevated to 185/89   Tick Exposure -Was removed off for her arm last night -Discussed with ID and does not need prophylaxis   Hypoalbuminemia -Patient's Albumin Trend Recent Labs  Lab 05/11/22 1625 05/11/22 2012 05/12/22 0148 05/13/22 0104 05/14/22 0036  ALBUMIN 2.4* 2.5* 2.4* 2.4* 2.4*  -Continue to Monitor and Trend and repeat CMP  in the AM   Morbid Obesity -Complicates overall prognosis and care -Estimated body mass index is 44.1 kg/m as calculated from the following:   Height as of this encounter: 5\' 2"  (1.575 m).   Weight as of this encounter: 109.4 kg.  -Weight Loss and Dietary Counseling given  and will need close PCP follow up   DVT prophylaxis: Place TED hose Start: 05/13/22 1441 SCDs Start: 05/11/22 2231    Code Status: Full Code Family Communication: No family present at bedside   Disposition Plan:  Level of care: Progressive Status is: Inpatient Remains inpatient appropriate because: Continues to be Lightheaded and Dizzy and complaining of a headache   Consultants:  Discussed with Neurology  Nephrology  Procedures:  As delineated as above  ECHOCARDIOGRAM IMPRESSIONS     1. Left ventricular ejection fraction, by estimation, is 60 to 65%. The  left ventricle has normal function. The left ventricle has no regional  wall motion abnormalities. There is moderate concentric left ventricular  hypertrophy. Indeterminate diastolic  filling due to E-A fusion.   2. Right ventricular systolic function is normal. The right ventricular  size is normal.   3. Prominent Crista terminalis.   4. A small pericardial effusion is present. The pericardial effusion is  posterior to the left ventricle.   5. The mitral valve is normal in structure. No evidence of mitral valve  regurgitation. No evidence of mitral stenosis.   6. The aortic valve was not well visualized. Aortic valve regurgitation  is not visualized. No aortic stenosis is present.   Comparison(s): No significant change from prior study.   FINDINGS   Left Ventricle: Left ventricular ejection fraction, by estimation, is 60  to 65%. The left ventricle has normal function. The left ventricle has no  regional wall motion abnormalities. The left ventricular internal cavity  size was normal in size. There is   moderate concentric left ventricular  hypertrophy. Indeterminate diastolic  filling due to E-A fusion.   Right Ventricle: The right ventricular size is normal. No increase in  right ventricular wall thickness. Right ventricular systolic function is  normal.   Left Atrium: Left atrial size was normal in size.   Right Atrium: Prominent Crista terminalis. Right atrial size was normal in  size. Prominent Crista terminalis.   Pericardium: A small pericardial effusion is present. The pericardial  effusion is posterior to the left ventricle.   Mitral Valve: The mitral valve is normal in structure. No evidence of  mitral valve regurgitation. No evidence of mitral valve stenosis.   Tricuspid Valve: The tricuspid valve is normal in structure. Tricuspid  valve regurgitation is trivial. No evidence of tricuspid stenosis.   Aortic Valve: The aortic valve was not well visualized. Aortic valve  regurgitation is not visualized. No aortic stenosis is present. Aortic  valve peak gradient measures 10.4 mmHg.   Pulmonic Valve: The pulmonic valve was normal in structure. Pulmonic valve  regurgitation is trivial. No evidence of pulmonic stenosis.   Aorta: The aortic root and ascending aorta are structurally normal, with  no evidence of dilitation.  IAS/Shunts: No atrial level shunt detected by color flow Doppler.     LEFT VENTRICLE  PLAX 2D  LVIDd:         3.90 cm   Diastology  LVIDs:         2.70 cm   LV e' medial:    6.99 cm/s  LV PW:         1.40 cm   LV E/e' medial:  11.5  LV IVS:        1.40 cm   LV e' lateral:   9.17 cm/s  LVOT diam:     2.10 cm   LV E/e' lateral: 8.8  LV SV:         76  LV SV Index:   37  LVOT Area:     3.46 cm     RIGHT VENTRICLE             IVC  RV S prime:     14.50 cm/s  IVC diam: 1.40 cm  TAPSE (M-mode): 2.0 cm   LEFT ATRIUM             Index        RIGHT ATRIUM          Index  LA diam:        3.70 cm 1.80 cm/m   RA Area:     9.87 cm  LA Vol (A2C):   39.7 ml 19.28 ml/m  RA Volume:   19.13  ml 9.29 ml/m  LA Vol (A4C):   35.5 ml 17.24 ml/m  LA Biplane Vol: 40.6 ml 19.71 ml/m   AORTIC VALVE  AV Area (Vmax): 2.62 cm  AV Vmax:        161.00 cm/s  AV Peak Grad:   10.4 mmHg  LVOT Vmax:      122.00 cm/s  LVOT Vmean:     81.400 cm/s  LVOT VTI:       0.220 m    AORTA  Ao Root diam: 2.80 cm  Ao Asc diam:  2.70 cm   MITRAL VALVE               TRICUSPID VALVE  MV Area (PHT): 4.06 cm    TR Peak grad:   11.4 mmHg  MV Decel Time: 187 msec    TR Vmax:        169.00 cm/s  MV E velocity: 80.60 cm/s  MV A velocity: 76.40 cm/s  SHUNTS  MV E/A ratio:  1.05        Systemic VTI:  0.22 m                             Systemic Diam: 2.10 cm   Antimicrobials:  Anti-infectives (From admission, onward)    Start     Dose/Rate Route Frequency Ordered Stop   05/13/22 2200  amoxicillin-clavulanate (AUGMENTIN) 500-125 MG per tablet 1 tablet        1 tablet Oral Every 12 hours 05/13/22 1405 05/23/22 2159   05/11/22 2100  Ampicillin-Sulbactam (UNASYN) 3 g in sodium chloride 0.9 % 100 mL IVPB  Status:  Discontinued        3 g 200 mL/hr over 30 Minutes Intravenous Every 12 hours 05/11/22 2054 05/13/22 1405       Subjective: Seen and examined at bedside and was still complain of a headache.  She was also nauseous and lightheaded earlier.  States that she just  does not feel well.  Objective: Vitals:   05/14/22 1014 05/14/22 1026 05/14/22 1623 05/14/22 1725  BP: (!) 142/81 (!) 145/87 (!) 171/80 (!) 185/89  Pulse:   90   Resp:   20 19  Temp:  97.9 F (36.6 C) 98.1 F (36.7 C)   TempSrc:  Oral Oral   SpO2:  95% 94%   Weight:      Height:        Intake/Output Summary (Last 24 hours) at 05/14/2022 1929 Last data filed at 05/14/2022 1823 Gross per 24 hour  Intake 717 ml  Output 1 ml  Net 716 ml   Filed Weights   05/11/22 2223 05/12/22 0444 05/14/22 0410  Weight: 109.5 kg 108.1 kg 109.4 kg   Examination: Physical Exam:  Constitutional: WN/WD obese African-American female who appears  uncomfortable complaining of a headache Respiratory: Diminished to auscultation bilaterally, no wheezing, rales, rhonchi or crackles. Normal respiratory effort and patient is not tachypenic. No accessory muscle use.  Unlabored breathing Cardiovascular: RRR, no murmurs / rubs / gallops. S1 and S2 auscultated.  Trace extremity edema Abdomen: Soft, non-tender, distended secondary to body habitus. Bowel sounds positive.  GU: Deferred. Musculoskeletal: No clubbing / cyanosis of digits/nails. No joint deformity upper and lower extremities.   Skin: No rashes, lesions, ulcers on a skin evaluation. No induration; Warm and dry.  Neurologic: CN 2-12 grossly intact with no focal deficits. Romberg sign and cerebellar reflexes not assessed.  Psychiatric: Normal judgment and insight. Alert and oriented x 3. Normal mood and appropriate affect.   Data Reviewed: I have personally reviewed following labs and imaging studies  CBC: Recent Labs  Lab 05/11/22 1625 05/11/22 2252 05/12/22 0148 05/13/22 0104 05/14/22 0036  WBC 8.0 6.9 7.1 5.4 6.4  NEUTROABS 5.8 4.0  --  2.7 3.7  HGB 9.1* 8.7* 7.5* 8.4* 8.4*  HCT 28.5* 25.9* 23.2* 26.1* 26.7*  MCV 85.3 82.5 83.5 83.4 85.6  PLT 311 313 305 303 295   Basic Metabolic Panel: Recent Labs  Lab 05/11/22 1625 05/11/22 2252 05/12/22 0148 05/13/22 0104 05/14/22 0036  NA 134* 133* 131* 134* 132*  K 5.2* 4.2 4.1 4.5 4.4  CL 102 101 99 104 102  CO2 24 22 20* 24 22  GLUCOSE 234* 170* 224* 119* 130*  BUN 38* 41* 40* 35* 39*  CREATININE 5.12* 4.88* 4.79* 4.86* 5.21*  CALCIUM 8.7* 8.6* 8.3* 8.4* 8.4*  MG  --   --  1.6* 1.8 1.7  PHOS  --   --  4.7* 4.7* 4.9*   GFR: Estimated Creatinine Clearance: 17.1 mL/min (A) (by C-G formula based on SCr of 5.21 mg/dL (H)). Liver Function Tests: Recent Labs  Lab 05/11/22 1625 05/11/22 2012 05/12/22 0148 05/13/22 0104 05/14/22 0036  AST 15 12* 11* 11* 13*  ALT 12 11 11 10 12   ALKPHOS 68 68 64 64 59  BILITOT 0.3 0.3  0.3 0.5 0.4  PROT 6.8 6.5 6.2* 6.5 6.4*  ALBUMIN 2.4* 2.5* 2.4* 2.4* 2.4*   No results for input(s): "LIPASE", "AMYLASE" in the last 168 hours. No results for input(s): "AMMONIA" in the last 168 hours. Coagulation Profile: No results for input(s): "INR", "PROTIME" in the last 168 hours. Cardiac Enzymes: Recent Labs  Lab 05/11/22 2012  CKTOTAL 197   BNP (last 3 results) No results for input(s): "PROBNP" in the last 8760 hours. HbA1C: Recent Labs    05/11/22 2252  HGBA1C 10.2*   CBG: Recent Labs  Lab 05/14/22 0016 05/14/22 0413  05/14/22 0649 05/14/22 1058 05/14/22 1621  GLUCAP 141* 95 93 108* 132*   Lipid Profile: No results for input(s): "CHOL", "HDL", "LDLCALC", "TRIG", "CHOLHDL", "LDLDIRECT" in the last 72 hours. Thyroid Function Tests: Recent Labs    05/11/22 2012  TSH 1.774   Anemia Panel: Recent Labs    05/11/22 2252 05/12/22 0148  VITAMINB12  --  384  FOLATE  --  8.1  FERRITIN 42  --   TIBC 259  --   IRON 37  --   RETICCTPCT 1.5  --    Sepsis Labs: Recent Labs  Lab 05/11/22 2012 05/11/22 2252  LATICACIDVEN 0.7 0.6    Recent Results (from the past 240 hour(s))  SARS Coronavirus 2 by RT PCR (hospital order, performed in [facility] hospital lab) *cepheid single result test* Anterior Nasal Swab     Status: None   Collection Time: 05/11/22 11:05 PM   Specimen: Anterior Nasal Swab  Result Value Ref Range Status   SARS Coronavirus 2 by RT PCR NEGATIVE NEGATIVE Final    Comment: Performed at [facility], [address], [place], [place] [address]  Respiratory (~20 pathogens) panel by PCR     Status: Abnormal   Collection Time: 05/11/22 11:05 PM   Specimen: Anterior Nasal Swab; Respiratory  Result Value Ref Range Status   Adenovirus NOT DETECTED NOT DETECTED Final   Coronavirus 229E NOT DETECTED NOT DETECTED Final    Comment: (NOTE) The Coronavirus on the Respiratory Panel, DOES NOT test for the novel  Coronavirus (2019 nCoV)     Coronavirus HKU1 NOT DETECTED NOT DETECTED Final   Coronavirus NL63 NOT DETECTED NOT DETECTED Final   Coronavirus OC43 NOT DETECTED NOT DETECTED Final   Metapneumovirus DETECTED (A) NOT DETECTED Final   Rhinovirus / Enterovirus NOT DETECTED NOT DETECTED Final   Influenza A NOT DETECTED NOT DETECTED Final   Influenza B NOT DETECTED NOT DETECTED Final   Parainfluenza Virus 1 NOT DETECTED NOT DETECTED Final   Parainfluenza Virus 2 NOT DETECTED NOT DETECTED Final   Parainfluenza Virus 3 NOT DETECTED NOT DETECTED Final   Parainfluenza Virus 4 NOT DETECTED NOT DETECTED Final   Respiratory Syncytial Virus NOT DETECTED NOT DETECTED Final   Bordetella pertussis NOT DETECTED NOT DETECTED Final   Bordetella Parapertussis NOT DETECTED NOT DETECTED Final   Chlamydophila pneumoniae NOT DETECTED NOT DETECTED Final   Mycoplasma pneumoniae NOT DETECTED NOT DETECTED Final    Comment: Performed at [facility], [address] [place], [place] [address]    Radiology Studies: MR BRAIN WO CONTRAST  Result Date: 05/14/2022 CLINICAL DATA:  37 year old female with possible right brainstem lacunar infarct on plain head CT yesterday. Diabetes, presyncope. EXAM: MRI HEAD WITHOUT CONTRAST TECHNIQUE: Multiplanar, multiecho pulse sequences of the brain and surrounding structures were obtained without intravenous contrast. COMPARISON:  Head CT yesterday.  Previous brain MRI 07/18/2019. FINDINGS: Brain: No restricted diffusion or evidence of acute infarction. Brainstem signal remains normal, artifact on the comparison CT. Cerebral volume remains normal. No midline shift, mass effect, evidence of mass lesion, ventriculomegaly, extra-axial collection or acute intracranial hemorrhage. Cervicomedullary junction and pituitary are within normal limits. Largely normal gray and white matter signal is within normal limits for age throughout the brain. However, evidence of scattered chronic cerebral microhemorrhages on T2* (series  10, images 13, 15) And only some of these were apparent by [facility] in 2021. No confluent hemosiderin. Deep gray nuclei, brainstem and cerebellum remain normal. Vascular: Major intracranial vascular  flow voids are preserved. Skull and upper cervical spine: Negative. Visualized bone marrow signal is within normal limits. Sinuses/Orbits: Postoperative changes to the left globe since the previous MRI. Paranasal Visualized paranasal sinuses and mastoids are stable and well aerated. Other: Grossly normal visible internal auditory structures. Visible scalp and face are within normal limits. IMPRESSION: 1. No acute intracranial abnormality and normal brainstem, artifact there on the Head CT yesterday. 2. However, suggestion of scattered chronic microhemorrhages in both cerebral hemispheres on T2* imaging today, chronic but increased compared to 2021 susceptibility weighted imaging. This is nonspecific but favor sequelae of small vessel disease. Electronically Signed   By: Odessa Fleming M.D.   On: 05/14/2022 08:46   CT HEAD WO CONTRAST ( )  Result Date: 05/13/2022 CLINICAL DATA:  Provided history: Syncope/presyncope, cerebrovascular cause suspected. EXAM: CT HEAD WITHOUT CONTRAST TECHNIQUE: Contiguous axial images were obtained from the base of the skull through the vertex without intravenous contrast. RADIATION DOSE REDUCTION: This exam was performed according to the departmental dose-optimization program which includes automated exposure control, adjustment of the mA and/or kV according to patient size and/or use of iterative reconstruction technique. COMPARISON:  Brain MRI 07/18/2019. FINDINGS: Brain: Cerebral volume is normal. A 3 mm lacunar infarct is questioned within the right aspect of the pons (series 3, image 12). There is no acute intracranial hemorrhage. No demarcated cortical infarct. No extra-axial fluid collection. No evidence of an intracranial mass. No midline shift. Vascular: No hyperdense vessel.  Atherosclerotic calcifications. Skull: No fracture or aggressive osseous lesion. Sinuses/Orbits: No mass or acute finding within the imaged orbits. No significant paranasal sinus disease. IMPRESSION: 1. A 3 mm lacunar infarct is questioned within the right aspect of the pons (which would be new from the prior brain MRI of 07/18/2019). Consider a brain MRI for further evaluation. 2. No acute demarcated cortical infarct. 3. No evidence of acute intracranial hemorrhage. Electronically Signed   By: Jackey Loge D.O.   On: 05/13/2022 15:29   DG CHEST PORT 1 VIEW  Result Date: 05/13/2022 CLINICAL DATA:  Shortness of breath. EXAM: PORTABLE CHEST 1 VIEW COMPARISON:  Radiographs 05/11/2022 and 04/29/2022.  CT 10/11/2019. FINDINGS: 0618 hours. The heart size and mediastinal contours are normal. The lungs are clear. There is no pleural effusion or pneumothorax. No acute osseous findings are identified. IMPRESSION: No active disease. Electronically Signed   By: Carey Bullocks M.D.   On: 05/13/2022 08:32    Scheduled Meds:  amoxicillin-clavulanate  1 tablet Oral Q12H   darbepoetin (ARANESP) injection - NON-DIALYSIS  40 mcg Subcutaneous Q Thu-1800   fluticasone  1 spray Each Nare Daily   furosemide  40 mg Oral Daily   guaiFENesin  600 mg Oral BID   insulin aspart  0-5 Units Subcutaneous QHS   insulin aspart  0-6 Units Subcutaneous TID WC   insulin glargine-yfgn  35 Units Subcutaneous QHS   pantoprazole  40 mg Oral Daily   sodium bicarbonate  650 mg Oral BID   Continuous Infusions:   LOS: 2 days   Marguerita Merles, DO [facility] Available via Epic secure chat 7am-7pm After these hours, please refer to coverage provider listed on [email or web address] 05/14/2022, 7:29 PM

## 2022-05-14 NOTE — Progress Notes (Signed)
Pharmacy Antibiotic Note  Karina [name] is a 37 y.o. female for which pharmacy has been consulted for ampicillin-sulbactam dosing for  tooth abscess . Patient with a history of T2DM, CKD, HTN.  SCr 5.21, making urine. Nephrology following for possibility of HD. Wbc wnl, afebrile. Still with tooth pain.   Plan: Continue Unasyn 3g q12h Trend WBC, Fever, Renal function F/u cultures, clinical course, WBC De-escalate when able  Height: 5\' 2"  (157.5 cm) Weight: 109.4 kg (241 lb 1.6 oz) IBW/kg (Calculated) : 50.1  Temp (24hrs), Avg:98 F (36.7 C), Min:97.7 F (36.5 C), Max:98.2 F (36.8 C)  Recent Labs  Lab 05/11/22 1625 05/11/22 2012 05/11/22 2252 05/12/22 0148 05/13/22 0104 05/14/22 0036  WBC 8.0  --  6.9 7.1 5.4 6.4  CREATININE 5.12*  --  4.88* 4.79* 4.86* 5.21*  LATICACIDVEN  --  0.7 0.6  --   --   --     Estimated Creatinine Clearance: 17.1 mL/min (A) (by C-G formula based on SCr of 5.21 mg/dL (H)).    Allergies  Allergen Reactions   Lisinopril Cough   Egg-Derived Products Nausea And Vomiting   Contrast Media [Iodinated Contrast Media]     Microbiology results: Pending  Thank you for allowing pharmacy to be a part of this patient's care.  Link Snuffer, PharmD, BCPS, BCCCP Clinical Pharmacist Please refer to [facility] for [facility] Pharmacy numbers 05/14/2022 8:18 AM

## 2022-05-14 NOTE — TOC Progression Note (Signed)
Transition of Care [facility]) - Progression Note    Patient Details  Name: Karina [name] MRN: [id number] Date of Birth: Jun 03, 1985  Transition of [facility]) CM/SW Contact  Gordy Clement, RN Phone Number: 05/14/2022, 2:17 PM  Clinical Narrative:     Patient has been recommended a rolling walker and [employer] will deliver bedside          Expected Discharge Plan and Services                                               Social Determinants of Health (SDOH) Interventions SDOH Screenings   Food Insecurity: No Food Insecurity (05/11/2022)  Housing: Low Risk  (05/11/2022)  Transportation Needs: No Transportation Needs (05/11/2022)  Utilities: Not At Risk (05/11/2022)  Depression (PHQ2-9): High Risk (11/19/2020)  Tobacco Use: Low Risk  (05/11/2022)    Readmission Risk Interventions     No data to display

## 2022-05-14 NOTE — Progress Notes (Signed)
Patient ID: Karina [name], female   DOB: Jun 23, 1985, 37 y.o.   MRN: [id number] S: Was not discharged yesterday, likely due to ongoing dizziness and abnormal CT scan.  MRI with no evidence of infarct, c/w small vessel disease.  STill feels dizzy today. O:BP 138/71   Pulse 93   Temp 97.9 F (36.6 C) (Oral)   Resp 17   Ht 5\' 2"  (1.575 m)   Wt 109.4 kg   LMP 04/18/2022 (Exact Date)   SpO2 97%   BMI 44.10 kg/m   Intake/Output Summary (Last 24 hours) at 05/14/2022 1121 Last data filed at 05/14/2022 0416 Gross per 24 hour  Intake 477 ml  Output 1 ml  Net 476 ml   Intake/Output: I/O last 3 completed shifts: In: 1413 [P.O.:1313; IV Piggyback:100] Out: 1 [Urine:1]  Intake/Output this shift:  No intake/output data recorded. Weight change:  Gen: NAD CVS: RRR  Resp:CTA Abd: +BS, soft, NT/ND Ext: no edema  Recent Labs  Lab 05/11/22 1625 05/11/22 2012 05/11/22 2252 05/12/22 0148 05/13/22 0104 05/14/22 0036  NA 134*  --  133* 131* 134* 132*  K 5.2*  --  4.2 4.1 4.5 4.4  CL 102  --  101 99 104 102  CO2 24  --  22 20* 24 22  GLUCOSE 234*  --  170* 224* 119* 130*  BUN 38*  --  41* 40* 35* 39*  CREATININE 5.12*  --  4.88* 4.79* 4.86* 5.21*  ALBUMIN 2.4* 2.5*  --  2.4* 2.4* 2.4*  CALCIUM 8.7*  --  8.6* 8.3* 8.4* 8.4*  PHOS  --   --   --  4.7* 4.7* 4.9*  AST 15 12*  --  11* 11* 13*  ALT 12 11  --  11 10 12    Liver Function Tests: Recent Labs  Lab 05/12/22 0148 05/13/22 0104 05/14/22 0036  AST 11* 11* 13*  ALT 11 10 12   ALKPHOS 64 64 59  BILITOT 0.3 0.5 0.4  PROT 6.2* 6.5 6.4*  ALBUMIN 2.4* 2.4* 2.4*   No results for input(s): "LIPASE", "AMYLASE" in the last 168 hours. No results for input(s): "AMMONIA" in the last 168 hours. CBC: Recent Labs  Lab 05/11/22 1625 05/11/22 2252 05/12/22 0148 05/13/22 0104 05/14/22 0036  WBC 8.0 6.9 7.1 5.4 6.4  NEUTROABS 5.8 4.0  --  2.7 3.7  HGB 9.1* 8.7* 7.5* 8.4* 8.4*  HCT 28.5* 25.9* 23.2* 26.1* 26.7*  MCV 85.3 82.5 83.5 83.4  85.6  PLT 311 313 305 303 295   Cardiac Enzymes: Recent Labs  Lab 05/11/22 2012  CKTOTAL 197   CBG: Recent Labs  Lab 05/13/22 2103 05/14/22 0016 05/14/22 0413 05/14/22 0649 05/14/22 1058  GLUCAP 161* 141* 95 93 108*    Iron Studies:  Recent Labs    05/11/22 2252  IRON 37  TIBC 259  FERRITIN 42   Studies/Results: MR BRAIN WO CONTRAST  Result Date: 05/14/2022 CLINICAL DATA:  36 year old female with possible right brainstem lacunar infarct on plain head CT yesterday. Diabetes, presyncope. EXAM: MRI HEAD WITHOUT CONTRAST TECHNIQUE: Multiplanar, multiecho pulse sequences of the brain and surrounding structures were obtained without intravenous contrast. COMPARISON:  Head CT yesterday.  Previous brain MRI 07/18/2019. FINDINGS: Brain: No restricted diffusion or evidence of acute infarction. Brainstem signal remains normal, artifact on the comparison CT. Cerebral volume remains normal. No midline shift, mass effect, evidence of mass lesion, ventriculomegaly, extra-axial collection or acute intracranial hemorrhage. Cervicomedullary junction and pituitary are within normal limits. Largely  normal gray and white matter signal is within normal limits for age throughout the brain. However, evidence of scattered chronic cerebral microhemorrhages on T2* (series 10, images 13, 15) And only some of these were apparent by SWI in 2021. No confluent hemosiderin. Deep gray nuclei, brainstem and cerebellum remain normal. Vascular: Major intracranial vascular flow voids are preserved. Skull and upper cervical spine: Negative. Visualized bone marrow signal is within normal limits. Sinuses/Orbits: Postoperative changes to the left globe since the previous MRI. Paranasal Visualized paranasal sinuses and mastoids are stable and well aerated. Other: Grossly normal visible internal auditory structures. Visible scalp and face are within normal limits. IMPRESSION: 1. No acute intracranial abnormality and normal  brainstem, artifact there on the Head CT yesterday. 2. However, suggestion of scattered chronic microhemorrhages in both cerebral hemispheres on T2* imaging today, chronic but increased compared to 2021 susceptibility weighted imaging. This is nonspecific but favor sequelae of small vessel disease. Electronically Signed   By: Odessa Fleming M.D.   On: 05/14/2022 08:46   CT HEAD WO CONTRAST ( )  Result Date: 05/13/2022 CLINICAL DATA:  Provided history: Syncope/presyncope, cerebrovascular cause suspected. EXAM: CT HEAD WITHOUT CONTRAST TECHNIQUE: Contiguous axial images were obtained from the base of the skull through the vertex without intravenous contrast. RADIATION DOSE REDUCTION: This exam was performed according to the departmental dose-optimization program which includes automated exposure control, adjustment of the mA and/or kV according to patient size and/or use of iterative reconstruction technique. COMPARISON:  Brain MRI 07/18/2019. FINDINGS: Brain: Cerebral volume is normal. A 3 mm lacunar infarct is questioned within the right aspect of the pons (series 3, image 12). There is no acute intracranial hemorrhage. No demarcated cortical infarct. No extra-axial fluid collection. No evidence of an intracranial mass. No midline shift. Vascular: No hyperdense vessel. Atherosclerotic calcifications. Skull: No fracture or aggressive osseous lesion. Sinuses/Orbits: No mass or acute finding within the imaged orbits. No significant paranasal sinus disease. IMPRESSION: 1. A 3 mm lacunar infarct is questioned within the right aspect of the pons (which would be new from the prior brain MRI of 07/18/2019). Consider a brain MRI for further evaluation. 2. No acute demarcated cortical infarct. 3. No evidence of acute intracranial hemorrhage. Electronically Signed   By: Jackey Loge D.O.   On: 05/13/2022 15:29   DG CHEST PORT 1 VIEW  Result Date: 05/13/2022 CLINICAL DATA:  Shortness of breath. EXAM: PORTABLE CHEST 1 VIEW  COMPARISON:  Radiographs 05/11/2022 and 04/29/2022.  CT 10/11/2019. FINDINGS: 0618 hours. The heart size and mediastinal contours are normal. The lungs are clear. There is no pleural effusion or pneumothorax. No acute osseous findings are identified. IMPRESSION: No active disease. Electronically Signed   By: Carey Bullocks M.D.   On: 05/13/2022 08:32    amoxicillin-clavulanate  1 tablet Oral Q12H   darbepoetin (ARANESP) injection - NON-DIALYSIS  40 mcg Subcutaneous Q Thu-1800   fluticasone  1 spray Each Nare Daily   furosemide  40 mg Oral Daily   guaiFENesin  600 mg Oral BID   insulin aspart  0-5 Units Subcutaneous QHS   insulin aspart  0-6 Units Subcutaneous TID WC   insulin glargine-yfgn  35 Units Subcutaneous QHS   pantoprazole  40 mg Oral Daily   sodium bicarbonate  650 mg Oral BID    BMET    Component Value Date/Time   NA 132 (L) 05/14/2022 0036   NA 136 08/31/2019 1425   K 4.4 05/14/2022 0036   CL 102 05/14/2022 0036  CO2 22 05/14/2022 0036   GLUCOSE 130 (H) 05/14/2022 0036   BUN 39 (H) 05/14/2022 0036   BUN 20 08/31/2019 1425   CREATININE 5.21 (H) 05/14/2022 0036   CREATININE 0.93 03/21/2015 1230   CALCIUM 8.4 (L) 05/14/2022 0036   GFRNONAA 10 (L) 05/14/2022 0036   GFRNONAA 82 03/21/2015 1230   GFRAA 48 (L) 10/10/2019 2133   GFRAA >89 03/21/2015 1230   CBC    Component Value Date/Time   WBC 6.4 05/14/2022 0036   RBC 3.12 (L) 05/14/2022 0036   HGB 8.4 (L) 05/14/2022 0036   HGB 11.2 08/25/2018 1102   HCT 26.7 (L) 05/14/2022 0036   HCT 35.2 08/25/2018 1102   PLT 295 05/14/2022 0036   PLT 410 08/25/2018 1102   MCV 85.6 05/14/2022 0036   MCV 82 08/25/2018 1102   MCH 26.9 05/14/2022 0036   MCHC 31.5 05/14/2022 0036   RDW 13.6 05/14/2022 0036   RDW 13.3 08/25/2018 1102   LYMPHSABS 1.8 05/14/2022 0036   MONOABS 0.4 05/14/2022 0036   EOSABS 0.3 05/14/2022 0036   BASOSABS 0.0 05/14/2022 0036    Assessment/Plan:   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  Scr stable,  no uremic symptoms.  I did discuss with her the need to have vascular access placement in the near future and to explore referral for transplant evaluation with Dr. Janit Pagan at her next visit this month. Hypotension - likely due to volume depletion after increasing dose of furosemide and poor po intake.  Also recent increase in hydralazine dose to 100 mg tid.  Feels better.  S/p IVF's and bp meds on hold.  Bp improved.  F/u with her primary to start slowly adding bp medications back and follow response.  Follow orthostatic vitals. Dizziness - CT scan yesterday with questionable lacunar infarct, however MRI did not show stroke but did show chronic micro-hemorrhages c/w small vessel disease. Anemia of CKD stage V - on IV iron and started ESA.  She will need to have this arranged as an outpatient with her primary nephrologist, Dr. Janit Pagan. OSA on CPAP Metabolic acidosis - on sodium bicarb  Irena Cords, MD [facility]

## 2022-05-14 NOTE — Evaluation (Signed)
Occupational Therapy Evaluation Patient Details Name: Karina [name] MRN: [id number] DOB: 02-11-1985 Today's Date: 05/14/2022   History of Present Illness Patient is a 37 year old female admittted 4/30 with hypotension. REcent changes in some medications.  Pt found to have metapneumovirus and was given gentle IV fluid hydration for hypotension. Pt has a right tooth infection as well.  PMH:  uncontrolled diabetes mellitus type 2 CKD stage V, hypertension   Clinical Impression   Pt admitted for above dx, PTA patient was independent no AD and completed all ADLs independently, she does report 3 falls in the last month due to not maintaining balance or tripping. Pt currently ambualting slowly no AD and min guard assist. Pt reported increased dizziness with rapid head turning and changes in position, including holding head in dependent position. These symptoms impacts patient's balance and activity tolerance during functional activities. Pt would benefit from continued acute skilled OT services to address above deficits and help transition to next level of care. Pt would benefit from outpatient OT services for post acute care.     Recommendations for follow up therapy are one component of a multi-disciplinary discharge planning process, led by the attending physician.  Recommendations may be updated based on patient status, additional functional criteria and insurance authorization.   Assistance Recommended at Discharge Intermittent Supervision/Assistance  Patient can return home with the following A little help with walking and/or transfers;Assistance with cooking/housework;Help with stairs or ramp for entrance;Assist for transportation    Functional Status Assessment  Patient has had a recent decline in their functional status and demonstrates the ability to make significant improvements in function in a reasonable and predictable amount of time.  Equipment Recommendations  Other (comment) (RW  pending pt progression)    Recommendations for Other Services       Precautions / Restrictions Precautions Precautions: Fall Precaution Comments: check BP Restrictions Weight Bearing Restrictions: No      Mobility Bed Mobility Overal bed mobility: Independent                  Transfers Overall transfer level: Needs assistance Equipment used: None   Sit to Stand: Min guard                  Balance Overall balance assessment: Needs assistance Sitting-balance support: Feet supported Sitting balance-Leahy Scale: Good       Standing balance-Leahy Scale: Fair Standing balance comment: Fair but slow gait                           ADL either performed or assessed with clinical judgement   ADL Overall ADL's : Needs assistance/impaired Eating/Feeding: Independent;Sitting   Grooming: Standing;Supervision/safety   Upper Body Bathing: Sitting;Supervision/ safety   Lower Body Bathing: Supervison/ safety;Sitting/lateral leans   Upper Body Dressing : Standing;Set up Upper Body Dressing Details (indicate cue type and reason): pt donned gown in jacket like fashion Lower Body Dressing: Supervision/safety;Sitting/lateral leans Lower Body Dressing Details (indicate cue type and reason): doff/don bilat socks Toilet Transfer: Ambulation;Min guard   Toileting- Clothing Manipulation and Hygiene: Supervision/safety;Sitting/lateral lean   Tub/ Shower Transfer: Ambulation;Min guard   Functional mobility during ADLs: Min guard General ADL Comments: close supervision for all ambulation. Educated patient on slow movements and pacing herself, allowing time to recover following changes in positioning.     Vision   Additional Comments: Basline L eye depth perception deficits     Perception     Praxis  Pertinent Vitals/Pain Pain Assessment Pain Assessment: Faces Faces Pain Scale: Hurts little more Pain Location: headache Pain Descriptors /  Indicators: Headache Pain Intervention(s): Limited activity within patient's tolerance, Monitored during session     Hand Dominance Right   Extremity/Trunk Assessment Upper Extremity Assessment Upper Extremity Assessment: Overall WFL for tasks assessed   Lower Extremity Assessment Lower Extremity Assessment: Defer to PT evaluation   Cervical / Trunk Assessment Cervical / Trunk Assessment: Normal   Communication Communication Communication: No difficulties   Cognition Arousal/Alertness: Awake/alert Behavior During Therapy: WFL for tasks assessed/performed Overall Cognitive Status: Within Functional Limits for tasks assessed                                       General Comments  Pt with increased dizziness when changing positions and fast head movements, BP 152/94    Exercises     Shoulder Instructions      Home Living Family/patient expects to be discharged to:: Private residence Living Arrangements: Children;Parent Available Help at Discharge: Family;Available PRN/intermittently (daughter to school and mom to work so 8-230 pt is alone) Type of Home: House Home Access: Stairs to enter Secretary/administrator of Steps: 5 Entrance Stairs-Rails: Right;Left Home Layout: One level     Bathroom Shower/Tub: Chief Strategy Officer: Standard     Home Equipment: None   Additional Comments: 3 falls in last month.      Prior Functioning/Environment Prior Level of Function : Independent/Modified Independent;Driving;Working/employed;History of Falls (last six months) (works for herself and drives to locations)             Mobility Comments: independent ADLs Comments: independent        OT Problem List: Decreased activity tolerance;Impaired balance (sitting and/or standing)      OT Treatment/Interventions: Therapeutic exercise;Therapeutic activities;Self-care/ADL training;Neuromuscular education;Energy conservation;DME and/or AE  instruction;Patient/family education;Balance training    OT Goals(Current goals can be found in the care plan section) Acute Rehab OT Goals Patient Stated Goal: to go home OT Goal Formulation: With patient Time For Goal Achievement: 05/28/22 Potential to Achieve Goals: Good  OT Frequency: Min 2X/week    Co-evaluation              AM-PAC OT "6 Clicks" Daily Activity     Outcome Measure Help from another person eating meals?: None Help from another person taking care of personal grooming?: None Help from another person toileting, which includes using toliet, bedpan, or urinal?: A Little Help from another person bathing (including washing, rinsing, drying)?: A Little Help from another person to put on and taking off regular upper body clothing?: A Little Help from another person to put on and taking off regular lower body clothing?: A Little 6 Click Score: 20   End of Session Equipment Utilized During Treatment: Gait belt Nurse Communication: Mobility status  Activity Tolerance: Patient tolerated treatment well Patient left: in bed;with call bell/phone within reach (sitting EOB eating food)  OT Visit Diagnosis: Unsteadiness on feet (R26.81);Dizziness and giddiness (R42)                Time: 4098-1191 OT Time Calculation (min): 28 min Charges:  OT General Charges $OT Visit: 1 Visit OT Evaluation $OT Eval Moderate Complexity: 1 Mod OT Treatments $Therapeutic Activity: 8-22 mins  05/14/2022  AB, OTR/L  Acute Rehabilitation Services  Office: [phone]   Tristan Schroeder 05/14/2022, 1:01 PM

## 2022-05-14 NOTE — Plan of Care (Signed)

## 2022-05-14 NOTE — Evaluation (Signed)
Physical Therapy Evaluation Patient Details Name: Karina [name] MRN: [id number] DOB: Apr 14, 1985 Today's Date: 05/14/2022  History of Present Illness  Patient is a 37 year old female admittted 4/30 with hypotension. REcent changes in some medications.  Pt found to have metapneumovirus and was given gentle IV fluid hydration for hypotension. Pt has a right tooth infection as well.  PMH:  uncontrolled diabetes mellitus type 2 CKD stage V, hypertension  Clinical Impression  Pt admitted with above diagnosis. Pt with limited treatment due to headache.  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 Gave pt x1 exercises to work on for hypofunction.  Orthostatic VS as below. Pt may need to use a rollator for incr balance upon return home and would benefit from f/u therapy. Will follow acutely.  Pt currently with functional limitations due to the deficits listed below (see PT Problem List). Pt will benefit from acute skilled PT to increase their independence and safety with mobility to allow discharge.         Orthostatic BPs  Supine 142/81  Sitting 170/81  Standing 144/82  Standing after 3 min 145/   Recommendations for follow up therapy are one component of a multi-disciplinary discharge planning process, led by the attending physician.  Recommendations may be updated based on patient status, additional functional criteria and insurance authorization.  Follow Up Recommendations       Assistance Recommended at Discharge PRN  Patient can return home with the following  Assistance with cooking/housework;Help with stairs or ramp for entrance    Equipment Recommendations Rollator (4 wheels)  Recommendations for Other Services       Functional Status Assessment Patient has had a recent decline in their functional status and demonstrates the ability to make significant improvements in function in a reasonable and predictable amount of time.     Precautions /  Restrictions Precautions Precautions: Fall Restrictions Weight Bearing Restrictions: No      Mobility  Bed Mobility Overal bed mobility: Independent                  Transfers Overall transfer level: Needs assistance Equipment used: None Transfers: Sit to/from Stand, Bed to chair/wheelchair/BSC Sit to Stand: Min guard   Step pivot transfers: Min guard       General transfer comment: Pt needed to use 3N1 therefore transferred to 3N1 with min guard assist.  No LOB.  Transferred to chair and back to bed as pt didnt feel up to walking due to headache.    Ambulation/Gait                  Stairs            Wheelchair Mobility    Modified Rankin (Stroke Patients Only)       Balance                                             Pertinent Vitals/Pain      Home Living Family/patient expects to be discharged to:: Private residence Living Arrangements: Children;Parent Available Help at Discharge: Family;Available PRN/intermittently (daughter to school and mom to work so 8-230 pt is alone) Type of Home: House Home Access: Stairs to enter Entrance Stairs-Rails: Doctor, general practice of Steps: 5   Home Layout: One level Home Equipment: None Additional Comments: 3 falls in last month. decreased depth perception in  L eye    Prior Function Prior Level of Function : Independent/Modified Independent;Driving;Working/employed;History of Falls (last six months) (works for herself and drives to locations)             Mobility Comments: independent ADLs Comments: independent     Hand Dominance   Dominant Hand: Right    Extremity/Trunk Assessment   Upper Extremity Assessment Upper Extremity Assessment: Defer to OT evaluation    Lower Extremity Assessment Lower Extremity Assessment: Generalized weakness    Cervical / Trunk Assessment Cervical / Trunk Assessment: Normal  Communication   Communication: No  difficulties  Cognition Arousal/Alertness: Awake/alert Behavior During Therapy: WFL for tasks assessed/performed Overall Cognitive Status: Within Functional Limits for tasks assessed                                          General Comments General comments (skin integrity, edema, etc.): Pt testing for vertigo revealed suspected right hypofuntion.    Exercises Other Exercises Other Exercises: initiated x 1 exercises and gave handout [id number]   Assessment/Plan    PT Assessment Patient needs continued PT services  PT Problem List Decreased activity tolerance;Decreased balance;Decreased mobility;Decreased knowledge of use of DME;Decreased safety awareness;Decreased knowledge of precautions;Obesity       PT Treatment Interventions DME instruction;Gait training;Functional mobility training;Therapeutic activities;Therapeutic exercise;Balance training;Patient/family education;Stair training    PT Goals (Current goals can be found in the Care Plan section)  Acute Rehab PT Goals Patient Stated Goal: to get better PT Goal Formulation: With patient Time For Goal Achievement: 05/28/22 Potential to Achieve Goals: Good    Frequency Min 3X/week     Co-evaluation               AM-PAC PT "6 Clicks" Mobility  Outcome Measure Help needed turning from your back to your side while in a flat bed without using bedrails?: None Help needed moving from lying on your back to sitting on the side of a flat bed without using bedrails?: None Help needed moving to and from a bed to a chair (including a wheelchair)?: A Little Help needed standing up from a chair using your arms (e.g., wheelchair or bedside chair)?: A Little Help needed to walk in hospital room?: A Little Help needed climbing 3-5 steps with a railing? : A Lot 6 Click Score: 19    End of Session Equipment Utilized During Treatment: Gait belt Activity Tolerance: Patient limited by fatigue Patient left: in bed;with  call bell/phone within reach;with bed alarm set Nurse Communication: Mobility status PT Visit Diagnosis: Muscle weakness (generalized) (M62.81);Dizziness and giddiness (R42)    Time: 4098-1191 PT Time Calculation (min) (ACUTE ONLY): 43 min   Charges:   PT Evaluation $PT Eval High Complexity: 1 High PT Treatments $Therapeutic Exercise: 8-22 mins $Therapeutic Activity: 8-22 mins        [facility] M,PT Acute Rehab Services [phone]   Bevelyn Buckles 05/14/2022, 12:49 PM

## 2022-05-15 DIAGNOSIS — N184 Chronic kidney disease, stage 4 (severe): Secondary | ICD-10-CM | POA: Diagnosis not present

## 2022-05-15 DIAGNOSIS — I10 Essential (primary) hypertension: Secondary | ICD-10-CM | POA: Diagnosis not present

## 2022-05-15 DIAGNOSIS — E861 Hypovolemia: Secondary | ICD-10-CM | POA: Diagnosis not present

## 2022-05-15 DIAGNOSIS — G4733 Obstructive sleep apnea (adult) (pediatric): Secondary | ICD-10-CM | POA: Diagnosis not present

## 2022-05-15 LAB — PHOSPHORUS: Phosphorus: 4.6 mg/dL (ref 2.5–4.6)

## 2022-05-15 LAB — CBC WITH DIFFERENTIAL/PLATELET
Abs Immature Granulocytes: 0.02 10*3/uL (ref 0.00–0.07)
Basophils Absolute: 0 10*3/uL (ref 0.0–0.1)
Basophils Relative: 1 %
Eosinophils Absolute: 0.3 10*3/uL (ref 0.0–0.5)
Eosinophils Relative: 4 %
HCT: 27.4 % — ABNORMAL LOW (ref 36.0–46.0)
Hemoglobin: 8.7 g/dL — ABNORMAL LOW (ref 12.0–15.0)
Immature Granulocytes: 0 %
Lymphocytes Relative: 26 %
Lymphs Abs: 1.9 10*3/uL (ref 0.7–4.0)
MCH: 27.2 pg (ref 26.0–34.0)
MCHC: 31.8 g/dL (ref 30.0–36.0)
MCV: 85.6 fL (ref 80.0–100.0)
Monocytes Absolute: 0.4 10*3/uL (ref 0.1–1.0)
Monocytes Relative: 6 %
Neutro Abs: 4.6 10*3/uL (ref 1.7–7.7)
Neutrophils Relative %: 63 %
Platelets: 320 10*3/uL (ref 150–400)
RBC: 3.2 MIL/uL — ABNORMAL LOW (ref 3.87–5.11)
RDW: 13.4 % (ref 11.5–15.5)
WBC: 7.4 10*3/uL (ref 4.0–10.5)
nRBC: 0 % (ref 0.0–0.2)

## 2022-05-15 LAB — GLUCOSE, CAPILLARY
Glucose-Capillary: 112 mg/dL — ABNORMAL HIGH (ref 70–99)
Glucose-Capillary: 137 mg/dL — ABNORMAL HIGH (ref 70–99)

## 2022-05-15 LAB — COMPREHENSIVE METABOLIC PANEL
ALT: 11 U/L (ref 0–44)
AST: 13 U/L — ABNORMAL LOW (ref 15–41)
Albumin: 2.5 g/dL — ABNORMAL LOW (ref 3.5–5.0)
Alkaline Phosphatase: 68 U/L (ref 38–126)
Anion gap: 9 (ref 5–15)
BUN: 40 mg/dL — ABNORMAL HIGH (ref 6–20)
CO2: 23 mmol/L (ref 22–32)
Calcium: 8.6 mg/dL — ABNORMAL LOW (ref 8.9–10.3)
Chloride: 100 mmol/L (ref 98–111)
Creatinine, Ser: 5.25 mg/dL — ABNORMAL HIGH (ref 0.44–1.00)
GFR, Estimated: 10 mL/min — ABNORMAL LOW (ref >60)
Glucose, Bld: 177 mg/dL — ABNORMAL HIGH (ref 70–99)
Potassium: 4.6 mmol/L (ref 3.5–5.1)
Sodium: 132 mmol/L — ABNORMAL LOW (ref 135–145)
Total Bilirubin: 0.5 mg/dL (ref 0.3–1.2)
Total Protein: 6.6 g/dL (ref 6.5–8.1)

## 2022-05-15 LAB — MAGNESIUM: Magnesium: 1.8 mg/dL (ref 1.7–2.4)

## 2022-05-15 MED ORDER — SENNOSIDES-DOCUSATE SODIUM 8.6-50 MG PO TABS
1.0000 | ORAL_TABLET | Freq: Two times a day (BID) | ORAL | Status: DC
  Administered 2022-05-15: 1 via ORAL
  Filled 2022-05-15: qty 1

## 2022-05-15 MED ORDER — CARVEDILOL 12.5 MG PO TABS: 12.5000 mg | ORAL_TABLET | Freq: Two times a day (BID) | ORAL | 0 refills | Status: AC

## 2022-05-15 MED ORDER — FUROSEMIDE 20 MG PO TABS: 20.0000 mg | ORAL_TABLET | Freq: Every day | ORAL | Status: DC

## 2022-05-15 MED ORDER — POLYETHYLENE GLYCOL 3350 17 G PO PACK
17.0000 g | PACK | Freq: Two times a day (BID) | ORAL | Status: DC
  Administered 2022-05-15: 17 g via ORAL
  Filled 2022-05-15: qty 1

## 2022-05-15 MED ORDER — SENNOSIDES-DOCUSATE SODIUM 8.6-50 MG PO TABS: 1.0000 | ORAL_TABLET | Freq: Two times a day (BID) | ORAL | 0 refills | Status: AC

## 2022-05-15 MED ORDER — ACETAMINOPHEN 650 MG RE SUPP: 650.0000 mg | Freq: Four times a day (QID) | RECTAL | Status: DC | PRN

## 2022-05-15 MED ORDER — CARVEDILOL 12.5 MG PO TABS: 12.5000 mg | ORAL_TABLET | Freq: Two times a day (BID) | ORAL | Status: DC

## 2022-05-15 MED ORDER — ACETAMINOPHEN 500 MG PO TABS: 1000.0000 mg | ORAL_TABLET | Freq: Four times a day (QID) | ORAL | Status: DC | PRN

## 2022-05-15 MED ORDER — GUAIFENESIN ER 600 MG PO TB12: 600.0000 mg | ORAL_TABLET | Freq: Two times a day (BID) | ORAL | 0 refills | Status: AC

## 2022-05-15 MED ORDER — OXYCODONE HCL 5 MG PO TABS: 5.0000 mg | ORAL_TABLET | Freq: Four times a day (QID) | ORAL | Status: DC | PRN

## 2022-05-15 MED ORDER — BISACODYL 10 MG RE SUPP: 10.0000 mg | Freq: Every day | RECTAL | Status: DC | PRN

## 2022-05-15 MED ORDER — SODIUM BICARBONATE 650 MG PO TABS: 650.0000 mg | ORAL_TABLET | Freq: Two times a day (BID) | ORAL | 0 refills | Status: AC

## 2022-05-15 MED ORDER — ACETAMINOPHEN 500 MG PO TABS: 1000.0000 mg | ORAL_TABLET | Freq: Four times a day (QID) | ORAL | 0 refills | Status: AC | PRN

## 2022-05-15 MED ORDER — POLYETHYLENE GLYCOL 3350 17 G PO PACK: 17.0000 g | PACK | Freq: Two times a day (BID) | ORAL | 0 refills | Status: AC

## 2022-05-15 MED ORDER — OXYCODONE HCL 5 MG PO TABS: 5.0000 mg | ORAL_TABLET | Freq: Four times a day (QID) | ORAL | 0 refills | Status: AC | PRN

## 2022-05-15 MED ORDER — FUROSEMIDE 20 MG PO TABS: 20.0000 mg | ORAL_TABLET | Freq: Every day | ORAL | 0 refills | Status: AC

## 2022-05-15 MED ORDER — MECLIZINE HCL 25 MG PO TABS: 25.0000 mg | ORAL_TABLET | Freq: Three times a day (TID) | ORAL | 0 refills | Status: AC | PRN

## 2022-05-15 MED ORDER — AMOXICILLIN-POT CLAVULANATE 500-125 MG PO TABS: 1.0000 | ORAL_TABLET | Freq: Two times a day (BID) | ORAL | 0 refills | Status: AC

## 2022-05-15 NOTE — Progress Notes (Signed)
Patient ID: DIAHANN LYND, female   DOB: 04/02/1985, 37 y.o.   MRN: [id number] S: Feeling better O:BP (!) 159/80   Pulse (!) 105   Temp 98.1 F (36.7 C) (Oral)   Resp 16   Ht 5\' 2"  (1.575 m)   Wt 108.1 kg   LMP 04/18/2022 (Exact Date)   SpO2 98%   BMI 43.59 kg/m   Intake/Output Summary (Last 24 hours) at 05/15/2022 1043 Last data filed at 05/15/2022 4098 Gross per 24 hour  Intake 597 ml  Output 1050 ml  Net -453 ml   Intake/Output: I/O last 3 completed shifts: In: 957 [P.O.:957] Out: 1051 [Urine:1051]  Intake/Output this shift:  No intake/output data recorded. Weight change: -1.262 kg Gen: NAD CVS: RRR Resp: CTA Abd: +BS, soft, NT/nD Ext: no edema  Recent Labs  Lab 05/11/22 1625 05/11/22 2012 05/11/22 2252 05/12/22 0148 05/13/22 0104 05/14/22 0036 05/15/22 0047  NA 134*  --  133* 131* 134* 132* 132*  K 5.2*  --  4.2 4.1 4.5 4.4 4.6  CL 102  --  101 99 104 102 100  CO2 24  --  22 20* 24 22 23   GLUCOSE 234*  --  170* 224* 119* 130* 177*  BUN 38*  --  41* 40* 35* 39* 40*  CREATININE 5.12*  --  4.88* 4.79* 4.86* 5.21* 5.25*  ALBUMIN 2.4* 2.5*  --  2.4* 2.4* 2.4* 2.5*  CALCIUM 8.7*  --  8.6* 8.3* 8.4* 8.4* 8.6*  PHOS  --   --   --  4.7* 4.7* 4.9* 4.6  AST 15 12*  --  11* 11* 13* 13*  ALT 12 11  --  11 10 12 11    Liver Function Tests: Recent Labs  Lab 05/13/22 0104 05/14/22 0036 05/15/22 0047  AST 11* 13* 13*  ALT 10 12 11   ALKPHOS 64 59 68  BILITOT 0.5 0.4 0.5  PROT 6.5 6.4* 6.6  ALBUMIN 2.4* 2.4* 2.5*   No results for input(s): "LIPASE", "AMYLASE" in the last 168 hours. No results for input(s): "AMMONIA" in the last 168 hours. CBC: Recent Labs  Lab 05/11/22 2252 05/12/22 0148 05/13/22 0104 05/14/22 0036 05/15/22 0047  WBC 6.9 7.1 5.4 6.4 7.4  NEUTROABS 4.0  --  2.7 3.7 4.6  HGB 8.7* 7.5* 8.4* 8.4* 8.7*  HCT 25.9* 23.2* 26.1* 26.7* 27.4*  MCV 82.5 83.5 83.4 85.6 85.6  PLT 313 305 303 295 320   Cardiac Enzymes: Recent Labs  Lab  05/11/22 2012  CKTOTAL 197   CBG: Recent Labs  Lab 05/14/22 0649 05/14/22 1058 05/14/22 1621 05/14/22 2120 05/15/22 0636  GLUCAP 93 108* 132* 205* 112*    Iron Studies: No results for input(s): "IRON", "TIBC", "TRANSFERRIN", "FERRITIN" in the last 72 hours. Studies/Results: MR BRAIN WO CONTRAST  Result Date: 05/14/2022 CLINICAL DATA:  37 year old female with possible right brainstem lacunar infarct on plain head CT yesterday. Diabetes, presyncope. EXAM: MRI HEAD WITHOUT CONTRAST TECHNIQUE: Multiplanar, multiecho pulse sequences of the brain and surrounding structures were obtained without intravenous contrast. COMPARISON:  Head CT yesterday.  Previous brain MRI 07/18/2019. FINDINGS: Brain: No restricted diffusion or evidence of acute infarction. Brainstem signal remains normal, artifact on the comparison CT. Cerebral volume remains normal. No midline shift, mass effect, evidence of mass lesion, ventriculomegaly, extra-axial collection or acute intracranial hemorrhage. Cervicomedullary junction and pituitary are within normal limits. Largely normal gray and white matter signal is within normal limits for age throughout the brain. However, evidence of  scattered chronic cerebral microhemorrhages on T2* (series 10, images 13, 15) And only some of these were apparent by SWI in 2021. No confluent hemosiderin. Deep gray nuclei, brainstem and cerebellum remain normal. Vascular: Major intracranial vascular flow voids are preserved. Skull and upper cervical spine: Negative. Visualized bone marrow signal is within normal limits. Sinuses/Orbits: Postoperative changes to the left globe since the previous MRI. Paranasal Visualized paranasal sinuses and mastoids are stable and well aerated. Other: Grossly normal visible internal auditory structures. Visible scalp and face are within normal limits. IMPRESSION: 1. No acute intracranial abnormality and normal brainstem, artifact there on the Head CT yesterday. 2.  However, suggestion of scattered chronic microhemorrhages in both cerebral hemispheres on T2* imaging today, chronic but increased compared to 2021 susceptibility weighted imaging. This is nonspecific but favor sequelae of small vessel disease. Electronically Signed   By: Odessa Fleming M.D.   On: 05/14/2022 08:46   CT HEAD WO CONTRAST ( )  Result Date: 05/13/2022 CLINICAL DATA:  Provided history: Syncope/presyncope, cerebrovascular cause suspected. EXAM: CT HEAD WITHOUT CONTRAST TECHNIQUE: Contiguous axial images were obtained from the base of the skull through the vertex without intravenous contrast. RADIATION DOSE REDUCTION: This exam was performed according to the departmental dose-optimization program which includes automated exposure control, adjustment of the mA and/or kV according to patient size and/or use of iterative reconstruction technique. COMPARISON:  Brain MRI 07/18/2019. FINDINGS: Brain: Cerebral volume is normal. A 3 mm lacunar infarct is questioned within the right aspect of the pons (series 3, image 12). There is no acute intracranial hemorrhage. No demarcated cortical infarct. No extra-axial fluid collection. No evidence of an intracranial mass. No midline shift. Vascular: No hyperdense vessel. Atherosclerotic calcifications. Skull: No fracture or aggressive osseous lesion. Sinuses/Orbits: No mass or acute finding within the imaged orbits. No significant paranasal sinus disease. IMPRESSION: 1. A 3 mm lacunar infarct is questioned within the right aspect of the pons (which would be new from the prior brain MRI of 07/18/2019). Consider a brain MRI for further evaluation. 2. No acute demarcated cortical infarct. 3. No evidence of acute intracranial hemorrhage. Electronically Signed   By: Jackey Loge D.O.   On: 05/13/2022 15:29    amoxicillin-clavulanate  1 tablet Oral Q12H   carvedilol  12.5 mg Oral BID WC   darbepoetin (ARANESP) injection - NON-DIALYSIS  40 mcg Subcutaneous Q Thu-1800    fluticasone  1 spray Each Nare Daily   [START ON 05/16/2022] furosemide  20 mg Oral Daily   guaiFENesin  600 mg Oral BID   insulin aspart  0-5 Units Subcutaneous QHS   insulin aspart  0-6 Units Subcutaneous TID WC   insulin glargine-yfgn  35 Units Subcutaneous QHS   pantoprazole  40 mg Oral Daily   polyethylene glycol  17 g Oral BID   senna-docusate  1 tablet Oral BID   sodium bicarbonate  650 mg Oral BID    BMET    Component Value Date/Time   NA 132 (L) 05/15/2022 0047   NA 136 08/31/2019 1425   K 4.6 05/15/2022 0047   CL 100 05/15/2022 0047   CO2 23 05/15/2022 0047   GLUCOSE 177 (H) 05/15/2022 0047   BUN 40 (H) 05/15/2022 0047   BUN 20 08/31/2019 1425   CREATININE 5.25 (H) 05/15/2022 0047   CREATININE 0.93 03/21/2015 1230   CALCIUM 8.6 (L) 05/15/2022 0047   GFRNONAA 10 (L) 05/15/2022 0047   GFRNONAA 82 03/21/2015 1230   GFRAA 48 (L) 10/10/2019 2133  GFRAA >89 03/21/2015 1230   CBC    Component Value Date/Time   WBC 7.4 05/15/2022 0047   RBC 3.20 (L) 05/15/2022 0047   HGB 8.7 (L) 05/15/2022 0047   HGB 11.2 08/25/2018 1102   HCT 27.4 (L) 05/15/2022 0047   HCT 35.2 08/25/2018 1102   PLT 320 05/15/2022 0047   PLT 410 08/25/2018 1102   MCV 85.6 05/15/2022 0047   MCV 82 08/25/2018 1102   MCH 27.2 05/15/2022 0047   MCHC 31.8 05/15/2022 0047   RDW 13.4 05/15/2022 0047   RDW 13.3 08/25/2018 1102   LYMPHSABS 1.9 05/15/2022 0047   MONOABS 0.4 05/15/2022 0047   EOSABS 0.3 05/15/2022 0047   BASOSABS 0.0 05/15/2022 0047    Assessment/Plan:   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  Scr stable, no uremic symptoms.  I did discuss with her the need to have vascular access placement in the near future and to explore referral for transplant evaluation with Dr. Janit Pagan at her next visit this month. Orthostatic Hypotension - likely due to volume depletion after increasing dose of furosemide and poor po intake.  Also recent increase in hydralazine dose to 100 mg tid.  Feels better.  S/p  IVF's and bp meds on hold.  Bp improved to high.  Decrease furosemide to 20 mg daily and restart carvedilol but at 12.5 mg bid (instead of 25).  Continue to hold losartan and hydralazine for now until she follows up with Dr. Janit Pagan after discharge. Dizziness - CT scan with questionable lacunar infarct, however MRI did not show stroke but did show chronic micro-hemorrhages c/w small vessel disease. Anemia of CKD stage V - on IV iron and started ESA.  She will need to have this arranged as an outpatient with her primary nephrologist, Dr. Janit Pagan. OSA on CPAP Metabolic acidosis - on sodium bicarb Disposition - stable for discharge from renal standpoint.  F/u with Dr. Janit Pagan.  Irena Cords, MD [facility]

## 2022-05-15 NOTE — Plan of Care (Signed)
  Problem: Education: Goal: Ability to describe self-care measures that may prevent or decrease complications (Diabetes Survival Skills Education) will improve Outcome: Adequate for Discharge Goal: Individualized Educational Video(s) Outcome: Adequate for Discharge   Problem: Coping: Goal: Ability to adjust to condition or change in health will improve Outcome: Adequate for Discharge   Problem: Fluid Volume: Goal: Ability to maintain a balanced intake and output will improve Outcome: Adequate for Discharge   Problem: Health Behavior/Discharge Planning: Goal: Ability to identify and utilize available resources and services will improve Outcome: Adequate for Discharge Goal: Ability to manage health-related needs will improve Outcome: Adequate for Discharge   Problem: Metabolic: Goal: Ability to maintain appropriate glucose levels will improve Outcome: Adequate for Discharge   Problem: Nutritional: Goal: Maintenance of adequate nutrition will improve Outcome: Adequate for Discharge Goal: Progress toward achieving an optimal weight will improve Outcome: Adequate for Discharge   Problem: Skin Integrity: Goal: Risk for impaired skin integrity will decrease Outcome: Adequate for Discharge   Problem: Tissue Perfusion: Goal: Adequacy of tissue perfusion will improve Outcome: Adequate for Discharge   Problem: Education: Goal: Knowledge of General Education information will improve Description: Including pain rating scale, medication(s)/side effects and non-pharmacologic comfort measures Outcome: Adequate for Discharge   Problem: Health Behavior/Discharge Planning: Goal: Ability to manage health-related needs will improve Outcome: Adequate for Discharge   Problem: Clinical Measurements: Goal: Ability to maintain clinical measurements within normal limits will improve Outcome: Adequate for Discharge Goal: Will remain free from infection Outcome: Adequate for Discharge Goal:  Diagnostic test results will improve Outcome: Adequate for Discharge Goal: Respiratory complications will improve Outcome: Adequate for Discharge Goal: Cardiovascular complication will be avoided Outcome: Adequate for Discharge   Problem: Activity: Goal: Risk for activity intolerance will decrease Outcome: Adequate for Discharge   Problem: Activity: Goal: Risk for activity intolerance will decrease Outcome: Adequate for Discharge   Problem: Nutrition: Goal: Adequate nutrition will be maintained Outcome: Adequate for Discharge   Problem: Coping: Goal: Level of anxiety will decrease Outcome: Adequate for Discharge   Problem: Elimination: Goal: Will not experience complications related to bowel motility Outcome: Adequate for Discharge Goal: Will not experience complications related to urinary retention Outcome: Adequate for Discharge   Problem: Pain Managment: Goal: General experience of comfort will improve Outcome: Adequate for Discharge   Problem: Safety: Goal: Ability to remain free from injury will improve Outcome: Adequate for Discharge   Problem: Skin Integrity: Goal: Risk for impaired skin integrity will decrease Outcome: Adequate for Discharge

## 2022-05-15 NOTE — TOC Transition Note (Addendum)
Transition of Care [facility]) - CM/SW Discharge Note   Patient Details  Name: Karina [name] MRN: [id number] Date of Birth: 1985/04/27  Transition of Care [facility]) CM/SW Contact:  Lawerance Sabal, RN Phone Number: 05/15/2022, 1:26 PM   Clinical Narrative:      Referral made for vestibular PT to [facility], and OP OT.   Spoke w DME agency and they did not get request for RW, and there is no order for RW.  Recs from PT are for rollator.  Spoke w patient and explained difference between RW and Rollator, and she would like a rollator. Order placed and [employer] will deliver rollator to the room for DC today.        Patient Goals and CMS Choice      Discharge Placement                         Discharge Plan and Services Additional resources added to the After Visit Summary for                                       Social Determinants of Health (SDOH) Interventions SDOH Screenings   Food Insecurity: No Food Insecurity (05/11/2022)  Housing: Low Risk  (05/11/2022)  Transportation Needs: No Transportation Needs (05/11/2022)  Utilities: Not At Risk (05/11/2022)  Depression (PHQ2-9): High Risk (11/19/2020)  Tobacco Use: Low Risk  (05/11/2022)     Readmission Risk Interventions     No data to display

## 2022-05-17 NOTE — Discharge Summary (Signed)
Physician Discharge Summary   Patient: Karina [name] MRN: [id number] DOB: [birth year]  Admit date:     05/11/2022  Discharge date: 05/15/2022  Discharge Physician: Marguerita Merles, DO   PCP: Barnie Mort, PA-C   Recommendations at discharge:   Follow-up with PCP within 1 to 2 weeks repeat CBC, CMP, mag, Phos within 1 week Follow-up with nephrology in outpatient setting in 1 to 2 weeks and resume antihypertensives slowly Follow-up with dentistry in the outpatient setting and have tooth pulled Continue outpatient vestibular rehabilitation  Discharge Diagnoses: Principal Problem:   Hypotension Active Problems:   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 [facility] Course: Patient is a 37 year old morbidly obese American female with past medical history significant for uncontrolled diabetes mellitus type 2 CKD stage V, hypertension as well as other comorbidities who presented with hypotension. She was recommended to walk with. She became short of breath and dizzy and she had to sit down. She states that none of her medications and recently adjusted she had developed hypokalemia in the case of her CKD and her Lasix has been increased her spironolactone has just been stopped. She reported shortness of breath that has been progressive the last few weeks with no lower extremity edema. She denies black stools. She any chest pain but had a slight discomfort. Upon arrival to ED her blood pressure was on the lower side 102/58. Not been having good appetite given that she got Ozempic and she reports that she has had a cough for least a month or so and  has been using amoxicillin prednisone. She is presented to the extraction but did not make an appointment has been taking extra Advil to help with pain. Given her dizziness and shortness of breath she presented to the ED for further evaluation. Recently her nephrologist increased her hydralazine from 25 mg twice daily to 100 twice daily and her new nephrologist has been taking her off the spironolactone. Patient states that her Lasix was increased from 20 mg to 40 mg p.o. daily and this was done 2 weeks ago. She was admitted for that her symptoms and was found to have metapneumovirus and was given gentle IV fluid hydration for hypotension. IV fluid hydration has now been held and nephrology has been consulted given her recent changes in her medications.    Patient continues to be dizzy, nauseous and lightheaded and complains of a headache still.  Will make further medication adjustments and MRI was discussed with the neurologist and neurologist recommended trying Compazine for her nausea and lightheadedness.  Will go up on her meclizine dose and add IV hydralazine for elevated blood pressure.  Will continue pain regimen and antibiotics for her tooth infection.  Subsequently the patient improved and is now medically stable for discharge and cleared by nephrology.  She will need to follow-up with PCP, nephrology, dental medicine and have outpatient vestibular physical therapy.  Assessment and Plan:  Hypotension, improved and now Hypertensive -Due to her poor p.o. intake and dehydration  -Will hold home medications but Nephrology has just initiated her on po Lasix again -Appreciate nephrology consult and received IVF  short-term with NS at 75 mL/hr and was stopped and -Her dizziness and headache have improved and she was little orthostatic but not symptomatic.  Blood pressure dropped from sitting to standing within equilibrated. 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, with long-term current use of insulin (HCC) -Order Sensitive SSI and changed to Very Sensitive Scale AC/HS -Continue home insulin but decreased to  35units, -Checked TSH and was 1.774 and HgA1C and HbA1c was 10.2 -Hold ozempic -CBG Trend:  Recent Labs  Lab 05/14/22 0413 05/14/22 0649 05/14/22 1058 05/14/22 1621 05/14/22 2120 05/15/22 0636 05/15/22 1158  GLUCAP 95 93 108* 132* 205* 112* 137*     CKD (chronic kidney disease), Stage IV (HCC) -Chronic  -BUN/Cr Trend: Recent Labs  Lab 04/23/22 0010 05/11/22 1625 05/11/22 2252 05/12/22 0148 05/13/22 0104 05/14/22 0036 05/15/22 0047  BUN 44* 38* 41* 40* 35* 39* 40*  CREATININE 4.95* 5.12* 4.88* 4.79* 4.86* 5.21* 5.25*  -IVF Hydration now stopped  -Avoid Nephrotoxic Medications such as NSAIDs, Vanomycin/Zosyn combination, Contrast Dyes, Hypotension and Dehydration to Ensure Adequate Renal Perfusion and will need to Renally Adjust Meds -Continue to Monitor and Trend Renal Function carefully and repeat CMP in the AM  -Will not give her any further Fluids and Nephrology recommends just continuing her po Lasix and no further BP meds -Appreciate Nephrology evaluation and recommendations will need to follow-up in outpatient setting   Postural Dizziness and Nausea with presyncope and recurrent falls  Headache, improved -Hypotension is improved now -Check Head CT w/o Contrast and showed "A 3 mm lacunar infarct is questioned within the right aspect of the pons (which would be new from the prior brain MRI of 07/18/2019). Consider a brain MRI for further evaluation. No acute demarcated cortical infarct. No evidence of acute intracranial hemorrhage." -Check MRI of the Brain w/o Contrast and if + consult Neuro Formally; MRI done and  showed "No acute intracranial abnormality and normal brainstem, artifact there on the Head CT yesterday. However, suggestion of scattered chronic microhemorrhages in both cerebral hemispheres on T2* imaging today, chronic but increased compared to 2021 susceptibility weighted imaging. This is nonspecific but favor sequelae of small vessel disease." -Check ECHO and showed "Left ventricular ejection fraction, by estimation, is 60 to 65%. The left ventricle has normal function. The left ventricle has no regional wall motion abnormalities. There is moderate concentric left ventricular  hypertrophy. Indeterminate diastolic filling due to E-A fusion. Right ventricular systolic function is normal. The right ventricular size is normal. Prominent Crista terminalis. A small pericardial effusion is present. The pericardial effusion is posterior to the left ventricle. The mitral valve is normal in structure. No evidence of mitral valve regurgitation. No evidence of mitral stenosis. The aortic valve was not well visualized. Aortic valve regurgitation is not visualized. No aortic stenosis is present. -Nephrology recommends resuming no other BP meds except the 40 mg po Lasix but may need to hold if she is still having lightheadedness and dizziness so we will decrease the Lasix to 20 mg p.o. daily.  Given her tachycardia and probably recommend resuming beta-blocker but at half the dose -Place TED Hose -Will Trial Low dose Meclizine 12.5 mg TIDprn and increase to 25 mg TIDprn -Add Compazine 10 mg IV q6hprn Refractory N/V -Checked TSH and  was 1.774 and obtain PT/OT evaluation  and obtain Vestibular evaluation and repeat Orthostatics in the AM -Patient was a little orthostatic and remains Dizzy and lightheaded a few days ago but was not lightheaded today but was a little orthostatic. -Vestibular Evaluation done and she had some hypofunction on the Right and PT recommending outpatient Vestibular PT -Continue to Adjust meds  and continue meclizine at discharge and follow-up with PCP   Tooth infection -Severe pain and tenderness over right cheek -No fever -Able to open mouth and has no drooling -Ordered IV Unasyn and Maxillofacial CT Scan -Maxillofacial CT done and showed "Bony structures are within normal limits. No acute fracture or dislocation is noted. No abnormality of the TMJ joints are seen. No dental abscess or periapical lucencies are noted." -Pain been ongoing for the past 3 wks doubt acute surgical indication  -Will need follow up with oral surgery in the outpatient setting given no In-House Dental Coverage -Pt has no evidence of sepsis or systemic infection but continues to have a headache and jaw fullness on the Right -Follow up with Dental Medicine in the outpatient setting    Upper Airway Cough syndrome in the setting of Metapneumovirus  -Cont albuterol prn -Check resp panel and + for Metapneumovirus -Add Guaifenesin 1200 mg po BID, Flutter Valve, and Incentive Spirometry -CXR done this Am and showed " The heart size and mediastinal contours are normal. The lungs are clear. There is no pleural effusion or pneumothorax. No acute osseous findings are identified."   OSA on CPAP -Order CPAP qhs   Hypertension -Allowed for Permissive HTN but not BP elevated -C/w po Lasix and will add IV Hydralazine -C/w Pain Control -Continue to Monitor BP per Protocol -Last BP was elevated to 174/99   Tick Exposure -Was removed off for her arm last night -Discussed with ID and does not need prophylaxis   Hypoalbuminemia -Patient's Albumin Trend Recent Labs  Lab 05/11/22 1625 05/11/22 2012 05/12/22 0148 05/13/22 0104 05/14/22 0036 05/15/22 0047  ALBUMIN 2.4* 2.5* 2.4* 2.4* 2.4* 2.5*  -Continue to Monitor and Trend and repeat CMP in the AM   Morbid Obesity -Complicates overall prognosis and care -Estimated body mass index is 43.59 kg/m as calculated from the following:   Height as of this  encounter: 5\' 2"  (1.575 m).   Weight as of this encounter: 108.1 kg.  -Weight Loss and Dietary Counseling given and will need close PCP follow up    Nutrition Documentation    Flowsheet Row ED to Hosp-Admission (Discharged) from 05/11/2022 in [facility] 3E HF PCU  Nutrition Problem Altered nutrition lab value  Etiology --  [uncontrolled DM]  Nutrition Goal Patient will meet greater than or equal to 90% of their needs  Interventions Education      Consultants: Nephrology Procedures performed: As delineated as above  Disposition: Home Diet recommendation:  Discharge Diet Orders (From admission, onward)     Start     Ordered   05/15/22 0000  Diet - low sodium heart healthy        05/15/22 1250           Cardiac and Carb modified diet DISCHARGE MEDICATION: Allergies as of 05/15/2022       Reactions   Lisinopril Cough   Egg-derived Products Nausea And Vomiting   Contrast Media [iodinated Contrast Media]         Medication List     STOP taking these medications    amoxicillin-clavulanate 875-125 MG tablet Commonly  known as: AUGMENTIN Replaced by: amoxicillin-clavulanate 500-125 MG tablet   atorvastatin 80 MG tablet Commonly known as: LIPITOR   baclofen 10 MG tablet Commonly known as: LIORESAL   Blood Pressure Monitor Automat Devi   Dexcom G6 Sensor Misc   hydrALAZINE 100 MG tablet Commonly known as: APRESOLINE   losartan 100 MG tablet Commonly known as: COZAAR   pregabalin 75 MG capsule Commonly known as: Lyrica   spironolactone 25 MG tablet Commonly known as: ALDACTONE   terbinafine 250 MG tablet Commonly known as: LamISIL   Tresiba FlexTouch 200 UNIT/ML FlexTouch Pen Generic drug: insulin degludec   Vitamin D3 50 MCG (2000 UT) Chew       TAKE these medications    acetaminophen 500 MG tablet Commonly known as: TYLENOL Take 2 tablets (1,000 mg total) by mouth every 6 (six) hours as needed for mild pain (or Fever >/= 101).    albuterol 108 (90 Base) MCG/ACT inhaler Commonly known as: VENTOLIN HFA Inhale 1-2 puffs into the lungs every 4 (four) hours as needed for shortness of breath.   amoxicillin-clavulanate 500-125 MG tablet Commonly known as: AUGMENTIN Take 1 tablet by mouth every 12 (twelve) hours for 10 days. Replaces: amoxicillin-clavulanate 875-125 MG tablet   B-D ULTRAFINE III SHORT PEN 31G X 8 MM Misc Generic drug: Insulin Pen Needle 1 Container by Does not apply route as needed.   benzonatate 200 MG capsule Commonly known as: TESSALON Take 1 capsule (200 mg total) by mouth 3 (three) times daily as needed for cough.   carvedilol 12.5 MG tablet Commonly known as: COREG Take 1 tablet (12.5 mg total) by mouth 2 (two) times daily with a meal. What changed:  medication strength how much to take Another medication with the same name was removed. Continue taking this medication, and follow the directions you see here.   cetirizine 5 MG tablet Commonly known as: ZYRTEC Take 1 tablet (5 mg total) by mouth every 3 (three) days.   CHLOROPHYLL PO Take 18 drops by mouth once a week.   cyclobenzaprine 5 MG tablet Commonly known as: FLEXERIL 5 mg at bedtime as needed for muscle spasms.   Dexcom G6 Transmitter Misc 1  ONCE DAILY FOR 90 DAYS   fluticasone 50 MCG/ACT nasal spray Commonly known as: FLONASE Place 1 spray into both nostrils daily.   furosemide 20 MG tablet Commonly known as: LASIX Take 1 tablet (20 mg total) by mouth daily. What changed: how much to take   guaiFENesin 600 MG 12 hr tablet Commonly known as: MUCINEX Take 1 tablet (600 mg total) by mouth 2 (two) times daily for 5 days.   HumaLOG KwikPen 200 UNIT/ML KwikPen Generic drug: insulin lispro INJECT 20 TO 28 UNITS SUBCUTANEOUSLY THREE TIMES DAILY What changed: See the new instructions.   InPen 100-Pink-Novo Devi Generic drug: injection device for insulin Use as directed with novolog cartridges   levocetirizine 5 MG  tablet Commonly known as: XYZAL Take 5 mg by mouth every evening.   meclizine 25 MG tablet Commonly known as: ANTIVERT Take 1 tablet (25 mg total) by mouth 3 (three) times daily as needed for dizziness.   OIL OF OREGANO PO Take 1 drop by mouth once a week.   oxyCODONE 5 MG immediate release tablet Commonly known as: Oxy IR/ROXICODONE Take 1 tablet (5 mg total) by mouth every 6 (six) hours as needed for moderate pain.   Ozempic (0.25 or 0.5 MG/DOSE) 2 MG/3ML Sopn Generic drug: Semaglutide(0.25 or 0.5MG /DOS) Inject into  the skin.   pantoprazole 40 MG tablet Commonly known as: Protonix Take 30- 60 min before your first and last meals of the day   polyethylene glycol 17 g packet Commonly known as: MIRALAX / GLYCOLAX Take 17 g by mouth 2 (two) times daily.   promethazine-dextromethorphan 6.25-15 MG/5ML syrup Commonly known as: PROMETHAZINE-DM Take 5 mLs by mouth 3 (three) times daily as needed for cough.   senna-docusate 8.6-50 MG tablet Commonly known as: Senokot-S Take 1 tablet by mouth 2 (two) times daily.   sodium bicarbonate 650 MG tablet Take 1 tablet (650 mg total) by mouth 2 (two) times daily.        Follow-up Information     Barnie Mort, PA-C Follow up.   Specialty: Physician Assistant Contact information: [address] [place] [address] [place] [address] [phone]         [facility] Follow up.   Specialty: Rehabilitation Why: for vestibular PT to treat your dizzyness Contact information: [address] [id number] mc [place] [address] [phone]               Discharge Exam: Filed Weights   05/12/22 0444 05/14/22 0410 05/15/22 0448  Weight: 108.1 kg 109.4 kg 108.1 kg   Vitals:   05/15/22 1017 05/15/22 1202  BP: (!) 159/80 (!) 174/99  Pulse:  97  Resp:  20  Temp:  98.2 F (36.8 C)  SpO2:  96%   Examination: Physical Exam:  Constitutional: WN/WD morbidly obese AAF in  NAD appears calm Respiratory: Diminished to auscultation bilaterally, no wheezing, rales, rhonchi or crackles. Normal respiratory effort and patient is not tachypenic. No accessory muscle use. Unlabored breathing  Cardiovascular: RRR, no murmurs / rubs / gallops. S1 and S2 auscultated. 1+ LE edema Abdomen: Soft, non-tender, Distended 2/2 body habitus. Bowel sounds positive.  GU: Deferred. Musculoskeletal: No clubbing / cyanosis of digits/nails. No joint deformity upper and lower extremities.  Skin: No rashes, lesions, ulcers on a limited skin evaluation. No induration; Warm and dry.  Neurologic: CN 2-12 grossly intact with no focal deficits.  Romberg sign and cerebellar reflexes not assessed.  Psychiatric: Normal judgment and insight. Alert and oriented x 3. Normal mood and appropriate affect.   Condition at discharge: stable  The results of significant diagnostics from this hospitalization (including imaging, microbiology, ancillary and laboratory) are listed below for reference.   Imaging Studies: MR BRAIN WO CONTRAST  Result Date: 05/14/2022 CLINICAL DATA:  37 year old female with possible right brainstem lacunar infarct on plain head CT yesterday. Diabetes, presyncope. EXAM: MRI HEAD WITHOUT CONTRAST TECHNIQUE: Multiplanar, multiecho pulse sequences of the brain and surrounding structures were obtained without intravenous contrast. COMPARISON:  Head CT yesterday.  Previous brain MRI 07/18/2019. FINDINGS: Brain: No restricted diffusion or evidence of acute infarction. Brainstem signal remains normal, artifact on the comparison CT. Cerebral volume remains normal. No midline shift, mass effect, evidence of mass lesion, ventriculomegaly, extra-axial collection or acute intracranial hemorrhage. Cervicomedullary junction and pituitary are within normal limits. Largely normal gray and white matter signal is within normal limits for age throughout the brain. However, evidence of scattered chronic  cerebral microhemorrhages on T2* (series 10, images 13, 15) And only some of these were apparent by [facility] in 2021. No confluent hemosiderin. Deep gray nuclei, brainstem and cerebellum remain normal. Vascular: Major intracranial vascular flow voids are preserved. Skull and upper cervical spine: Negative. Visualized bone marrow signal is within normal limits. Sinuses/Orbits: Postoperative changes to the left  globe since the previous MRI. Paranasal Visualized paranasal sinuses and mastoids are stable and well aerated. Other: Grossly normal visible internal auditory structures. Visible scalp and face are within normal limits. IMPRESSION: 1. No acute intracranial abnormality and normal brainstem, artifact there on the Head CT yesterday. 2. However, suggestion of scattered chronic microhemorrhages in both cerebral hemispheres on T2* imaging today, chronic but increased compared to 2021 susceptibility weighted imaging. This is nonspecific but favor sequelae of small vessel disease. Electronically Signed   By: Odessa Fleming M.D.   On: 05/14/2022 08:46   CT HEAD WO CONTRAST ( )  Result Date: 05/13/2022 CLINICAL DATA:  Provided history: Syncope/presyncope, cerebrovascular cause suspected. EXAM: CT HEAD WITHOUT CONTRAST TECHNIQUE: Contiguous axial images were obtained from the base of the skull through the vertex without intravenous contrast. RADIATION DOSE REDUCTION: This exam was performed according to the departmental dose-optimization program which includes automated exposure control, adjustment of the mA and/or kV according to patient size and/or use of iterative reconstruction technique. COMPARISON:  Brain MRI 07/18/2019. FINDINGS: Brain: Cerebral volume is normal. A 3 mm lacunar infarct is questioned within the right aspect of the pons (series 3, image 12). There is no acute intracranial hemorrhage. No demarcated cortical infarct. No extra-axial fluid collection. No evidence of an intracranial mass. No midline shift.  Vascular: No hyperdense vessel. Atherosclerotic calcifications. Skull: No fracture or aggressive osseous lesion. Sinuses/Orbits: No mass or acute finding within the imaged orbits. No significant paranasal sinus disease. IMPRESSION: 1. A 3 mm lacunar infarct is questioned within the right aspect of the pons (which would be new from the prior brain MRI of 07/18/2019). Consider a brain MRI for further evaluation. 2. No acute demarcated cortical infarct. 3. No evidence of acute intracranial hemorrhage. Electronically Signed   By: Jackey Loge D.O.   On: 05/13/2022 15:29   DG CHEST PORT 1 VIEW  Result Date: 05/13/2022 CLINICAL DATA:  Shortness of breath. EXAM: PORTABLE CHEST 1 VIEW COMPARISON:  Radiographs 05/11/2022 and 04/29/2022.  CT 10/11/2019. FINDINGS: 0618 hours. The heart size and mediastinal contours are normal. The lungs are clear. There is no pleural effusion or pneumothorax. No acute osseous findings are identified. IMPRESSION: No active disease. Electronically Signed   By: Carey Bullocks M.D.   On: 05/13/2022 08:32   ECHOCARDIOGRAM COMPLETE  Result Date: 05/12/2022    ECHOCARDIOGRAM REPORT   Patient Name:   JAMES BURROW Date of Exam: 05/12/2022 Medical Rec #:  [id number]        Height:       62.0 in Accession #:    [id number]       Weight:       238.3 lb Date of Birth:  1985-03-06        BSA:          2.059 m Patient Age:    36 years         BP:           142/75 mmHg Patient Gender: F                HR:           96 bpm. Exam Location:  Inpatient Procedure: 2D Echo, Cardiac Doppler and Color Doppler Indications:    Elevated Troponin, Syncope R55  History:        Patient has prior history of Echocardiogram examinations, most                 recent 08/09/2019. Arrythmias:Tachycardia, Signs/Symptoms:Dyspnea  and Shortness of Breath; Risk Factors:Hypertension, Diabetes and                 Sleep Apnea. CKD, stage 3.  Sonographer:    Lucendia Herrlich Referring Phys: [id number] ANASTASSIA DOUTOVA  IMPRESSIONS  1. Left ventricular ejection fraction, by estimation, is 60 to 65%. The left ventricle has normal function. The left ventricle has no regional wall motion abnormalities. There is moderate concentric left ventricular hypertrophy. Indeterminate diastolic filling due to E-A fusion.  2. Right ventricular systolic function is normal. The right ventricular size is normal.  3. Prominent Crista terminalis.  4. A small pericardial effusion is present. The pericardial effusion is posterior to the left ventricle.  5. The mitral valve is normal in structure. No evidence of mitral valve regurgitation. No evidence of mitral stenosis.  6. The aortic valve was not well visualized. Aortic valve regurgitation is not visualized. No aortic stenosis is present. Comparison(s): No significant change from prior study. FINDINGS  Left Ventricle: Left ventricular ejection fraction, by estimation, is 60 to 65%. The left ventricle has normal function. The left ventricle has no regional wall motion abnormalities. The left ventricular internal cavity size was normal in size. There is  moderate concentric left ventricular hypertrophy. Indeterminate diastolic filling due to E-A fusion. Right Ventricle: The right ventricular size is normal. No increase in right ventricular wall thickness. Right ventricular systolic function is normal. Left Atrium: Left atrial size was normal in size. Right Atrium: Prominent Crista terminalis. Right atrial size was normal in size. Prominent Crista terminalis. Pericardium: A small pericardial effusion is present. The pericardial effusion is posterior to the left ventricle. Mitral Valve: The mitral valve is normal in structure. No evidence of mitral valve regurgitation. No evidence of mitral valve stenosis. Tricuspid Valve: The tricuspid valve is normal in structure. Tricuspid valve regurgitation is trivial. No evidence of tricuspid stenosis. Aortic Valve: The aortic valve was not well visualized. Aortic  valve regurgitation is not visualized. No aortic stenosis is present. Aortic valve peak gradient measures 10.4 mmHg. Pulmonic Valve: The pulmonic valve was normal in structure. Pulmonic valve regurgitation is trivial. No evidence of pulmonic stenosis. Aorta: The aortic root and ascending aorta are structurally normal, with no evidence of dilitation. IAS/Shunts: No atrial level shunt detected by color flow Doppler.  LEFT VENTRICLE PLAX 2D LVIDd:         3.90 cm   Diastology LVIDs:         2.70 cm   LV e' medial:    6.99 cm/s LV PW:         1.40 cm   LV E/e' medial:  11.5 LV IVS:        1.40 cm   LV e' lateral:   9.17 cm/s LVOT diam:     2.10 cm   LV E/e' lateral: 8.8 LV SV:         76 LV SV Index:   37 LVOT Area:     3.46 cm  RIGHT VENTRICLE             IVC RV S prime:     14.50 cm/s  IVC diam: 1.40 cm TAPSE (M-mode): 2.0 cm LEFT ATRIUM             Index        RIGHT ATRIUM          Index LA diam:        3.70 cm 1.80 cm/m   RA Area:  9.87 cm LA Vol (A2C):   39.7 ml 19.28 ml/m  RA Volume:   19.13 ml 9.29 ml/m LA Vol (A4C):   35.5 ml 17.24 ml/m LA Biplane Vol: 40.6 ml 19.71 ml/m  AORTIC VALVE AV Area (Vmax): 2.62 cm AV Vmax:        161.00 cm/s AV Peak Grad:   10.4 mmHg LVOT Vmax:      122.00 cm/s LVOT Vmean:     81.400 cm/s LVOT VTI:       0.220 m  AORTA Ao Root diam: 2.80 cm Ao Asc diam:  2.70 cm MITRAL VALVE               TRICUSPID VALVE MV Area (PHT): 4.06 cm    TR Peak grad:   11.4 mmHg MV Decel Time: 187 msec    TR Vmax:        169.00 cm/s MV E velocity: 80.60 cm/s MV A velocity: 76.40 cm/s  SHUNTS MV E/A ratio:  1.05        Systemic VTI:  0.22 m                            Systemic Diam: 2.10 cm Riley Lam MD Electronically signed by Riley Lam MD Signature Date/Time: 05/12/2022/11:22:29 AM    Final    CT MAXILLOFACIAL WO CONTRAST  Result Date: 05/11/2022 CLINICAL DATA:  Temporomandibular joint pain EXAM: CT MAXILLOFACIAL WITHOUT CONTRAST TECHNIQUE: Multidetector CT imaging of the  maxillofacial structures was performed. Multiplanar CT image reconstructions were also generated. RADIATION DOSE REDUCTION: This exam was performed according to the departmental dose-optimization program which includes automated exposure control, adjustment of the mA and/or kV according to patient size and/or use of iterative reconstruction technique. COMPARISON:  None Available. FINDINGS: Osseous: Bony structures are within normal limits. No acute fracture or dislocation is noted. No abnormality of the TMJ joints are seen. No dental abscess or periapical lucencies are noted. Orbits: Orbits and their contents are within normal limits. Sinuses: Paranasal sinuses are unremarkable. Soft tissues: No soft tissue abnormality is noted. Limited intracranial: Within normal limits. IMPRESSION: No acute abnormality noted. Electronically Signed   By: Alcide Clever M.D.   On: 05/11/2022 21:49   CT Renal Stone Study  Result Date: 05/11/2022 CLINICAL DATA:  Evaluate for bowel obstruction EXAM: CT ABDOMEN AND PELVIS WITHOUT CONTRAST TECHNIQUE: Multidetector CT imaging of the abdomen and pelvis was performed following the standard protocol without IV contrast. RADIATION DOSE REDUCTION: This exam was performed according to the departmental dose-optimization program which includes automated exposure control, adjustment of the mA and/or kV according to patient size and/or use of iterative reconstruction technique. COMPARISON:  CT abdomen and pelvis 11/19/2020 FINDINGS: Lower chest: No acute abnormality. Hepatobiliary: No focal liver abnormality is seen. No gallstones, gallbladder wall thickening, or biliary dilatation. Pancreas: Unremarkable. No pancreatic ductal dilatation or surrounding inflammatory changes. Spleen: Normal in size without focal abnormality. Adrenals/Urinary Tract: Adrenal glands are unremarkable. Kidneys are normal, without renal calculi, focal lesion, or hydronephrosis. Bladder is unremarkable. Stomach/Bowel:  Stomach is within normal limits. Appendix appears normal. No evidence of bowel wall thickening, distention, or inflammatory changes. Vascular/Lymphatic: No significant vascular findings are present. No enlarged abdominal or pelvic lymph nodes. Reproductive: Uterus and bilateral adnexa are unremarkable. Other: No abdominal wall hernia or abnormality. No abdominopelvic ascites. Musculoskeletal: No acute or significant osseous findings. IMPRESSION: 1. No CT evidence of acute abdominal/pelvic process. Electronically Signed   By: Linton Rump  Malena Edman M.D.   On: 05/11/2022 19:18   DG Chest 2 View  Result Date: 05/11/2022 CLINICAL DATA:  Shortness of breath and dizziness EXAM: CHEST - 2 VIEW COMPARISON:  Radiographs 04/29/2022 FINDINGS: The heart size and mediastinal contours are within normal limits. Both lungs are clear. The visualized skeletal structures are unremarkable. IMPRESSION: No active cardiopulmonary disease. Electronically Signed   By: Minerva Fester M.D.   On: 05/11/2022 17:53   DG Chest 2 View  Result Date: 04/29/2022 CLINICAL DATA:  Productive cough. EXAM: CHEST - 2 VIEW COMPARISON:  CXR 04/15/22 FINDINGS: The heart size and mediastinal contours are within normal limits. Both lungs are clear. The visualized skeletal structures are unremarkable. IMPRESSION: No active cardiopulmonary disease. Electronically Signed   By: Lorenza Cambridge M.D.   On: 04/29/2022 11:21    Microbiology: Results for orders placed or performed during the hospital encounter of 05/11/22  SARS Coronavirus 2 by RT PCR (hospital order, performed in [facility] hospital lab) *cepheid single result test* Anterior Nasal Swab     Status: None   Collection Time: 05/11/22 11:05 PM   Specimen: Anterior Nasal Swab  Result Value Ref Range Status   SARS Coronavirus 2 by RT PCR NEGATIVE NEGATIVE Final    Comment: Performed at [facility], [address], [place], [place] [address]  Respiratory (~20 pathogens) panel by PCR     Status:  Abnormal   Collection Time: 05/11/22 11:05 PM   Specimen: Anterior Nasal Swab; Respiratory  Result Value Ref Range Status   Adenovirus NOT DETECTED NOT DETECTED Final   Coronavirus 229E NOT DETECTED NOT DETECTED Final    Comment: (NOTE) The Coronavirus on the Respiratory Panel, DOES NOT test for the novel  Coronavirus (2019 nCoV)    Coronavirus HKU1 NOT DETECTED NOT DETECTED Final   Coronavirus NL63 NOT DETECTED NOT DETECTED Final   Coronavirus OC43 NOT DETECTED NOT DETECTED Final   Metapneumovirus DETECTED (A) NOT DETECTED Final   Rhinovirus / Enterovirus NOT DETECTED NOT DETECTED Final   Influenza A NOT DETECTED NOT DETECTED Final   Influenza B NOT DETECTED NOT DETECTED Final   Parainfluenza Virus 1 NOT DETECTED NOT DETECTED Final   Parainfluenza Virus 2 NOT DETECTED NOT DETECTED Final   Parainfluenza Virus 3 NOT DETECTED NOT DETECTED Final   Parainfluenza Virus 4 NOT DETECTED NOT DETECTED Final   Respiratory Syncytial Virus NOT DETECTED NOT DETECTED Final   Bordetella pertussis NOT DETECTED NOT DETECTED Final   Bordetella Parapertussis NOT DETECTED NOT DETECTED Final   Chlamydophila pneumoniae NOT DETECTED NOT DETECTED Final   Mycoplasma pneumoniae NOT DETECTED NOT DETECTED Final    Comment: Performed at [facility], [address] [place], [place] [address]   Labs: CBC: Recent Labs  Lab 05/11/22 1625 05/11/22 2252 05/12/22 0148 05/13/22 0104 05/14/22 0036 05/15/22 0047  WBC 8.0 6.9 7.1 5.4 6.4 7.4  NEUTROABS 5.8 4.0  --  2.7 3.7 4.6  HGB 9.1* 8.7* 7.5* 8.4* 8.4* 8.7*  HCT 28.5* 25.9* 23.2* 26.1* 26.7* 27.4*  MCV 85.3 82.5 83.5 83.4 85.6 85.6  PLT 311 313 305 303 295 320   Basic Metabolic Panel: Recent Labs  Lab 05/11/22 2252 05/12/22 0148 05/13/22 0104 05/14/22 0036 05/15/22 0047  NA 133* 131* 134* 132* 132*  K 4.2 4.1 4.5 4.4 4.6  CL 101 99 104 102 100  CO2 22 20* 24 22 23   GLUCOSE 170* 224* 119* 130* 177*  BUN 41* 40* 35* 39* 40*  CREATININE  4.88* 4.79* 4.86* 5.21* 5.25*  CALCIUM 8.6* 8.3* 8.4* 8.4* 8.6*  MG  --  1.6* 1.8 1.7 1.8  PHOS  --  4.7* 4.7* 4.9* 4.6   Liver Function Tests: Recent Labs  Lab 05/11/22 2012 05/12/22 0148 05/13/22 0104 05/14/22 0036 05/15/22 0047  AST 12* 11* 11* 13* 13*  ALT 11 11 10 12 11   ALKPHOS 68 64 64 59 68  BILITOT 0.3 0.3 0.5 0.4 0.5  PROT 6.5 6.2* 6.5 6.4* 6.6  ALBUMIN 2.5* 2.4* 2.4* 2.4* 2.5*   CBG: Recent Labs  Lab 05/14/22 1058 05/14/22 1621 05/14/22 2120 05/15/22 0636 05/15/22 1158  GLUCAP 108* 132* 205* 112* 137*   Discharge time spent: greater than 30 minutes.  Signed: Marguerita Merles, DO [facility] 05/17/2022

## 2022-05-20 ENCOUNTER — Other Ambulatory Visit: Payer: Self-pay

## 2022-05-20 DIAGNOSIS — N184 Chronic kidney disease, stage 4 (severe): Secondary | ICD-10-CM

## 2022-05-24 ENCOUNTER — Ambulatory Visit: Payer: Medicaid Other | Admitting: Physical Therapy

## 2022-05-26 ENCOUNTER — Ambulatory Visit: Payer: Medicaid Other | Attending: Internal Medicine

## 2022-05-26 DIAGNOSIS — R42 Dizziness and giddiness: Secondary | ICD-10-CM | POA: Insufficient documentation

## 2022-05-26 DIAGNOSIS — R2681 Unsteadiness on feet: Secondary | ICD-10-CM | POA: Diagnosis present

## 2022-05-26 NOTE — Therapy (Signed)
OUTPATIENT PHYSICAL THERAPY VESTIBULAR EVALUATION     Patient Name: Karina [name] MRN: [id number] DOB:[birth year], 37 y.o., female Today's Date: 05/26/2022  END OF SESSION:  PT End of Session - 05/26/22 1007     Visit Number 1    Number of Visits 9    Date for PT Re-Evaluation 07/23/22    Authorization Type [place] med[place] prepaid    PT Start Time 1016    PT Stop Time 1057    PT Time Calculation (min) 41 min    Activity Tolerance Patient tolerated treatment well    Behavior During Therapy [facility] for tasks assessed/performed             Past Medical History:  Diagnosis Date   Acute pain of right shoulder 03/13/2019   Allergy    Angio-edema    Asthma    Back pain 12/12/2017   Carpal tunnel syndrome of right wrist 03/13/2019   COVID-19 01/24/2019   Depression 12/27/2012   Diabetes mellitus    Type 2, insulin resistant   Diabetic neuropathy, type II diabetes mellitus (HCC) 05/23/2013   DOE (dyspnea on exertion) 05/21/2019   Onset with covid 19 infection symptoms started 01/16/19  -  05/23/2019   Walked RA  3 laps @ approx 228ft each @ moderat pace  stopped due to end of study,  no limiting sob/ sats 100% at end despite reported sob at rest    Essential hypertension, benign 12/12/2013   GERD (gastroesophageal reflux disease)    History of COVID-19 03/22/2019   Lower extremity edema 08/29/2017   Moderate nonproliferative diabetic retinopathy (HCC) 05/24/2013   Both OS and OD.  Diagnosed by Dr. Altamease Oiler, MD, PhD 05/23/13. Also mild macular edema. Follows with Dr. 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, with long-term current use of insulin (HCC) 12/27/2012         Upper airway cough syndrome 03/22/2019   Past Surgical History:  Procedure Laterality Date   CERVICAL BIOPSY  2004   CESAREAN SECTION N/A 06/02/2012   Procedure: CESAREAN SECTION;  Surgeon: Catalina Antigua, MD;  Location: [facility];  Service: Obstetrics;  Laterality: N/A;   RETINAL DETACHMENT REPAIR W/ SCLERAL BUCKLE LE Left 10/08/2019   Patient Active Problem List   Diagnosis Date Noted   Hypotension 05/11/2022   Postural dizziness with presyncope 05/11/2022   Tooth infection 05/11/2022   CKD (chronic kidney disease), stage IV (HCC) 04/29/2022   Costochondral chest pain 04/29/2022   Flank pain 11/20/2020   Pelvic girdle weakness 11/20/2020   Hypertension 11/20/2020   Stage 3b chronic kidney disease (CKD) (HCC) 11/20/2020   Hypersomnia, persistent 09/11/2020   OSA on CPAP 09/11/2020   Uncontrolled type 2 diabetes mellitus with hyperglycemia (HCC) 09/11/2020   PVFS (postviral fatigue syndrome) 09/11/2020   Other allergic rhinitis 03/18/2020   Reactive airway disease 03/18/2020   Adverse reaction to food, subsequent encounter 03/18/2020   Heartburn 03/18/2020   Vitamin D deficiency 09/04/2019   Stable proliferative diabetic retinopathy of both eyes associated with type 2 diabetes mellitus (HCC) 08/17/2019   Current mild episode of major depressive disorder (HCC) 08/08/2019   Sleep-related headache 07/05/2019   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, drug or chemical induced, with severe nonproliferative retinopathy (HCC) 07/05/2019   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, bilateral (HCC) 07/05/2019   Cephalalgia 07/05/2019   Proliferative diabetic retinopathy of right eye (HCC) 06/20/2019   Proliferative diabetic retinopathy of left eye (HCC) 06/20/2019   Vitreous hemorrhage of left eye (HCC) 06/20/2019   DOE (dyspnea on exertion) 05/21/2019   Tachycardia 05/21/2019   History of COVID-19 03/22/2019   Upper airway cough syndrome  03/22/2019   Shortness of breath 03/22/2019   Carpal tunnel syndrome of right wrist 03/13/2019   Gastroesophageal reflux disease 03/23/2018   Lower extremity edema 08/29/2017   Hypertension associated with stage 2 chronic kidney disease due to type 2 diabetes mellitus (HCC) 12/12/2013   Morbid obesity (HCC) 05/25/2013   Type 2 diabetes mellitus with both eyes affected by moderate nonproliferative retinopathy and macular edema, with long-term current use of insulin (HCC) 12/27/2012   Depression 12/27/2012    PCP: Warnell Forester, PA-C REFERRING PROVIDER: Merlene Laughter, DO  REFERRING DIAG: R42 (ICD-10-CM) - Dizzy   THERAPY DIAG:  Unsteadiness on feet  Dizziness and giddiness  ONSET DATE: 05/15/22 referral  Rationale for Evaluation and Treatment: Rehabilitation  SUBJECTIVE:   SUBJECTIVE STATEMENT: Patient arrives to clinic alone, using SBQC. Reports feeling like her balance is off and feels like she's swaying. States that if she moves her head too fast, she'll get dizzy. Has been limiting how much she does around her house because of the dizziness. States this all started ~1 month ago. Has had multiple falls before she ended up in hospital. Day before she was in the hospital, she reports that her nephrologist "quadrupled" her BP rx. Reports that she will always fall to the R. Has not fallen since leaving the hospital.  Pt accompanied by: self  PERTINENT HISTORY: DM2, CKD V, HTN, partially blind L eye, GERD, anxiety, asthma  PAIN:  Are you having pain? Yes: NPRS scale: 2-3/10 Pain location: R leg (thinks it might be related to not eating breakfast this AM)   PRECAUTIONS: Fall  WEIGHT BEARING RESTRICTIONS: No  FALLS: Has patient fallen in last 6 months? Yes. Number of falls at least 3; all related to dizziness  LIVING ENVIRONMENT: Lives with: lives with their family Lives in: House/apartment Stairs: Yes: External: 4-5 steps; bilateral but cannot reach both Has following  equipment at home: Quad cane Editor, commissioning - 4 wheeled  PLOF: Independent driving  PATIENT GOALS: "to improve my balance"  OBJECTIVE:   DIAGNOSTIC FINDINGS: 05/14/22 brain MRI IMPRESSION: 1. No acute intracranial abnormality and normal brainstem, artifact there on the Head CT yesterday.   2. However, suggestion of scattered chronic microhemorrhages in both cerebral hemispheres on T2* imaging today, chronic but increased compared to 2021 susceptibility weighted imaging. This is nonspecific but favor sequelae of small vessel disease.  COGNITION: Overall cognitive status: Within functional limits for tasks assessed   SENSATION: Intermittent tingling in her B feet   POSTURE:  No Significant postural limitations  Cervical ROM:   WFL  STRENGTH: WFL  BED MOBILITY:  Independent, denies dizziness  TRANSFERS: Assistive device utilized: Counselling psychologist  Sit to stand: Modified independence Stand to sit: Modified independence Chair to chair: Modified independence   GAIT: Gait pattern: step through pattern and wide BOS Distance walked: clinic Assistive device utilized: Quad cane small base Level of assistance: Modified independence Comments: slower gait speed for age  FUNCTIONAL TESTS:  M-CTSIB Condition 1: 30s mild sway Condition 2: 30s mild sway Condition 3: 30s moderate sway Condition 4: 30s moderate sway   PATIENT SURVEYS:  FOTO 50; expected to be at 59  VESTIBULAR ASSESSMENT:  GENERAL OBSERVATION: NAD, using SBQC for stability    SYMPTOM BEHAVIOR:  Subjective history: see above  Non-Vestibular symptoms: diplopia, headaches, nausea/vomiting, and migraine symptoms (light sensitivity)   Type of dizziness: Lightheadedness/Faint and "tweedy birds"; difficulty keeping focus  Frequency: on 45s-1 min  Aggravating factors: Spontaneous and Induced by motion: bending down to the ground, turning head quickly, and sitting in a moving car  Relieving factors:  medication, closing eyes, and slow movements  Progression of symptoms:  fluctuated  OCULOMOTOR EXAM:  Ocular Alignment: normal  Ocular ROM: No Limitations  Spontaneous Nystagmus: absent  Gaze-Induced Nystagmus: absent  Smooth Pursuits: intact  Saccades: hypometric/undershootsto the R  Convergence/Divergence: <5 cm    VESTIBULAR - OCULAR REFLEX:   Slow VOR: Normal  VOR Cancellation: Normal  Head-Impulse Test: HIT Right: positive HIT Left: negative  Dynamic Visual Acuity: Static: 9 Dynamic: 7   POSITIONAL TESTING: not indicated   VESTIBULAR TREATMENT:                                                                                                    PATIENT EDUCATION: Education details: PT POC, exam findings, initial VOR exercises Person educated: Patient Education method: Explanation, Demonstration, and Handouts Education comprehension: verbalized understanding and needs further education  HOME EXERCISE PROGRAM: Gaze Stabilization: Sitting    Keeping eyes on target on wall 5 feet away, tilt head down 15-30 and move head side to side for __30__ seconds. Repeat while moving head up and down for _30___ seconds. Do __3__ sessions per day.   GOALS: Goals reviewed with patient? Yes  SHORT TERM GOALS: Target date: 06/25/22  Pt will be independent with initial HEP for improved balance and symptom report  Baseline: to be provided Goal status: INITIAL  2.  MSQ goal Baseline: to be assessed Goal status: INITIAL  3.  SOT goal Baseline: to be assessed Goal status: INITIAL   LONG TERM GOALS: Target date: 07/23/22  Pt will be independent with final HEP for improved balance and symptom report  Baseline: to be provided Goal status: INITIAL  MSQ goal Baseline: to be assessed Goal status: INITIAL  SOT goal Baseline: to be assessed Goal status: INITIAL  4. Patient will improve FOTO score to >/= 50 to indicate improvement in overall symptoms   Baseline: 51  Goal  status: INITIAL   ASSESSMENT:  CLINICAL IMPRESSION: Patient is a 37 y.o. female who was seen today for physical therapy evaluation and treatment for dizziness and imbalance. 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 While she did complete all conditions of the M-CTSIB, she did have excessive sway with EC and on a compliant surface- further supporting a R vestibular hypofunction. She would benefit from skilled PT services to address the above mentioned deficits.    OBJECTIVE IMPAIRMENTS: decreased balance, decreased knowledge of condition, decreased knowledge of use of DME,  and dizziness.   ACTIVITY LIMITATIONS: bending, bathing, locomotion level, and caring for others  PARTICIPATION LIMITATIONS: meal prep, cleaning, laundry, driving, shopping, community activity, occupation, and yard work  PERSONAL FACTORS: Age, Transportation, and 3+ comorbidities: see above  are also affecting patient's functional outcome.   REHAB POTENTIAL: Good  CLINICAL DECISION MAKING: Stable/uncomplicated  EVALUATION COMPLEXITY: Low   PLAN:  PT FREQUENCY: 1x/week per patient request  PT DURATION: 8 weeks  PLANNED INTERVENTIONS: Therapeutic exercises, Therapeutic activity, Neuromuscular re-education, Balance training, Gait training, Patient/Family education, Self Care, Joint mobilization, Vestibular training, Canalith repositioning, Visual/preceptual remediation/compensation, DME instructions, Aquatic Therapy, Manual therapy, and Re-evaluation  PLAN FOR NEXT SESSION: MSQ, SOT, HEP   Westley Foots, PT, DPT, CBIS 05/26/2022, 10:58 AM

## 2022-05-31 ENCOUNTER — Ambulatory Visit (HOSPITAL_COMMUNITY): Payer: Medicaid Other

## 2022-05-31 ENCOUNTER — Ambulatory Visit: Payer: Medicaid Other

## 2022-06-01 ENCOUNTER — Ambulatory Visit: Payer: Medicaid Other

## 2022-06-02 ENCOUNTER — Encounter: Payer: Medicaid Other | Admitting: Vascular Surgery

## 2022-06-09 ENCOUNTER — Ambulatory Visit: Payer: Medicaid Other | Admitting: Physical Therapy

## 2022-06-09 NOTE — Progress Notes (Deleted)
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    History of Present Illness  05/23/2019  Pulmonary/ 1st office eval/Huxley Shurley  Chief Complaint  Patient presents with   Pulmonary Consult    SOb X 4 month, coughing without phlegm, chest burn when coughing, post covid   Dyspnea:  Onset at rest lasts   sev minutes even if not coughing / assoc with chest pressure not necessarily related cough/ no nausea/ no diaphoresis  Cough: dry hacking pattern day and night but worese talking, eating  Sleep: on back since covid can't breath/ pillows get her up 30 degrees but bed is flat  SABA use: ventolin ? Helps if takes before ex  Prednisone helped some  rec Increase tessalon to 200 mg three times daily until no longer coughing, then take as needed  Depomedrol 120 mg IM today  Prilosec 20mg  x 2  30 min before breakfast  and x 2 before supper  GERD  Diet   07/02/2019  f/u ov/Virna Livengood re: uacs  Chief Complaint  Patient presents with   Follow-up    SOB on exertion and while asleep. Family says she snores. Can exersise a bit longer than usual.    Dyspnea:  Ok at rest now / only doe with steps or if gets in hurry  Cough: much better  Sleeping: no better with freq awakening despite bed blocks/ moderate daytime drowsiness  SABA use: before ex  02: none  Rec Protonix 40 mg Take 30- 60 min before your first and last meals of the day  Try albuterol 15 min before an activity that you know would make you short of breath     01/22/2022 Acute ov/ Elonzo Sopp re: UACS  maint on protonix once daily / no inhalers  Chief Complaint  Patient presents with    Acute Visit    Coughing with choking spells.  Feels like choking on saliva. Occ wheeze. Sx x 2 months  Onset late Nov 2023 bad uri multiple close contacts all neg for covid better x never stopped coughing then covid pos around christmas 2023 and worse cough to point of choking/ occ sub wheeze daytime   Dyspnea:  assoc with choking spells  Cough: dry /sporadic Sleeping: bed is flat lots of pillows / does ok at night  SABA use: not used  02: none  Rec Depomedrol 120 mg IM  Any time you start the cough > protonix should be Take 30- 60 min before your first and last meals of the day until cough is gone for at least a week GERD diet reviewed, bed blocks rec  For cough > tessalon pearls up o 200 mg every 6 hours as needed   Call for follow up if not 100% better in 2 weeks     06/10/2022  f/u ov/Jadrian Bulman re: UACS   maint on ***  No chief complaint on file.   Dyspnea:  *** Cough: *** Sleeping: *** SABA use: *** 02: *** Covid status:   *** Lung cancer screening :  ***    No obvious  day to day or daytime variability or assoc excess/ purulent sputum or mucus plugs or hemoptysis or cp or chest tightness, subjective wheeze or overt sinus or hb symptoms.   *** without nocturnal  or early am exacerbation  of respiratory  c/o's or need for noct saba. Also denies any obvious fluctuation of symptoms with weather or environmental changes or other aggravating or alleviating factors except as outlined above   No unusual exposure hx or h/o childhood pna/ asthma or knowledge of premature birth.  Current Allergies, Complete Past Medical History, Past Surgical History, Family History, and Social History were reviewed in [employer] record.  ROS  The following are not active complaints unless bolded Hoarseness, sore throat, dysphagia, dental problems, itching, sneezing,  nasal congestion or discharge of excess mucus or purulent secretions, ear ache,   fever, chills, sweats, unintended  wt loss or wt gain, classically pleuritic or exertional cp,  orthopnea pnd or arm/hand swelling  or leg swelling, presyncope, palpitations, abdominal pain, anorexia, nausea, vomiting, diarrhea  or change in bowel habits or change in bladder habits, change in stools or change in urine, dysuria, hematuria,  rash, arthralgias, visual complaints, headache, numbness, weakness or ataxia or problems with walking or coordination,  change in mood or  memory.        No outpatient medications have been marked as taking for the 06/10/22 encounter (Appointment) with Nyoka Cowden, MD.                    Past Medical History:  Diagnosis Date   Diabetes mellitus    Type 2, insulin resistant   Syncope 04/08/2015      Objective:     wts  06/10/2022         ***  01/22/2022        253 07/02/2019       264   05/23/19 262 lb 9.6 oz (119.1 kg)  05/21/19 259 lb 8 oz (117.7 kg)  04/09/19 250 lb (113.4 kg)    Vital signs reviewed  06/10/2022  - Note at rest 02 sats  ***% on ***   General appearance:    ***           Assessment

## 2022-06-10 ENCOUNTER — Ambulatory Visit: Payer: Medicaid Other | Admitting: Internal Medicine

## 2022-06-16 ENCOUNTER — Ambulatory Visit: Payer: Medicaid Other

## 2022-06-23 ENCOUNTER — Encounter: Payer: Medicaid Other | Admitting: Physical Therapy

## 2022-06-28 ENCOUNTER — Encounter: Payer: Medicaid Other | Admitting: Physical Therapy

## 2022-07-07 ENCOUNTER — Encounter: Payer: Medicaid Other | Admitting: Physical Therapy

## 2022-07-14 ENCOUNTER — Encounter: Payer: Medicaid Other | Admitting: Physical Therapy

## 2022-07-21 ENCOUNTER — Encounter: Payer: Medicaid Other | Admitting: Physical Therapy

## 2022-08-06 ENCOUNTER — Ambulatory Visit
Admission: EM | Admit: 2022-08-06 | Discharge: 2022-08-06 | Disposition: A | Discharge status: Home / Self Care | Payer: Medicaid Other | Attending: Emergency Medicine | Admitting: Emergency Medicine

## 2022-08-06 DIAGNOSIS — E119 Type 2 diabetes mellitus without complications: Secondary | ICD-10-CM

## 2022-08-06 DIAGNOSIS — N184 Chronic kidney disease, stage 4 (severe): Secondary | ICD-10-CM | POA: Diagnosis not present

## 2022-08-06 DIAGNOSIS — Z794 Long term (current) use of insulin: Secondary | ICD-10-CM | POA: Diagnosis not present

## 2022-08-06 DIAGNOSIS — I10 Essential (primary) hypertension: Secondary | ICD-10-CM

## 2022-08-06 LAB — POCT URINALYSIS DIP (MANUAL ENTRY)
Bilirubin, UA: NEGATIVE
Glucose, UA: NEGATIVE mg/dL
Ketones, POC UA: NEGATIVE mg/dL
Leukocytes, UA: NEGATIVE
Nitrite, UA: NEGATIVE
Protein Ur, POC: >=300 mg/dL — AB
Spec Grav, UA: 1.02 (ref 1.010–1.025)
Urobilinogen, UA: 0.2 E.U./dL
pH, UA: 7 (ref 5.0–8.0)

## 2022-08-06 LAB — POCT FASTING CBG KUC MANUAL ENTRY: POCT Glucose (KUC): 105 mg/dL — AB (ref 70–99)

## 2022-08-06 MED ORDER — FUROSEMIDE 40 MG PO TABS: 40.0000 mg | ORAL_TABLET | Freq: Two times a day (BID) | ORAL | 0 refills | Status: AC

## 2022-08-06 MED ORDER — POTASSIUM CHLORIDE CRYS ER 20 MEQ PO TBCR: 20.0000 meq | EXTENDED_RELEASE_TABLET | Freq: Every morning | ORAL | 0 refills | Status: AC

## 2022-08-06 NOTE — ED Notes (Signed)
Attempted venipuncture, unsuccessful. Charge Rn called in.

## 2022-08-06 NOTE — Discharge Instructions (Addendum)
For short-term treatment of excessive swelling in your lower extremities and feelings of shortness of breath, I recommend that you increase your furosemide dose to 80 mg daily for the next 3 days, please take one 40 mg tablet in the morning and evening.  Please be advised, you will be using the bathroom a lot.  Because furosemide is known to cause your potassium levels to drop when taken at high doses, have also provided you with a potassium replacement called potassium chloride.  Please take 1 tablet every morning for the next 3 days.  Your blood sugar level today was excellent.  Your urinalysis was unremarkable.  The result of your blood tests should be available in the next 2 to 3 days.  They will be posted to your MyChart account but you will also receive a phone call advising of the results and any further recommendations based on those results.  In the meantime, please redouble your efforts to keep your diabetes well-controlled and significantly decrease your salt intake to no more than 2300 mg/day.  Keep your feet elevated is much as possible when seated.  Thank you for visiting [facility] today.  We appreciate the opportunity to participate in your care.

## 2022-08-06 NOTE — ED Provider Notes (Signed)
[facility]    CSN: [id number] Arrival date & time: 08/06/22  0821    HISTORY   Chief Complaint  Patient presents with   Shortness of Breath   Leg Swelling   HPI Karina [name] is a pleasant, 37 y.o. 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  Patient presents to urgent care today Complaining of increased swelling in her lower extremities that has become progressively worse over the past month over the past month and shortness of breath.  Patient states she has not been compliant with low-salt diet.  Patient states she has not taken her blood pressure medications as of yet this morning.  Patient states her nephrologist advised her to come to urgent care if she noted increased swelling in her lower extremities which she does.  Patient denies pain in her lower extremities.  Patient has an oxygen saturation of 99% on arrival with a heart rate of 85 and respirations of 18.  Patient's last hemoglobin A1c was checked in April of this year and was 10.2.  The history is provided by the patient.   Past Medical History:  Diagnosis Date   Acute pain of right shoulder 03/13/2019   Allergy    Angio-edema    Asthma    Back pain 12/12/2017   Carpal tunnel syndrome of right wrist 03/13/2019   COVID-19 01/24/2019   Depression 12/27/2012   Diabetes mellitus    Type 2, insulin resistant   Diabetic neuropathy, type II diabetes mellitus (HCC) 05/23/2013   DOE (dyspnea on exertion) 05/21/2019   Onset with covid 19 infection symptoms started 01/16/19  -  05/23/2019   Walked RA  3 laps @ approx 229ft each @ moderat pace  stopped due to end of study,  no limiting sob/ sats 100% at end despite reported sob at rest    Essential hypertension, benign 12/12/2013   GERD (gastroesophageal reflux disease)    History of COVID-19  03/22/2019   Lower extremity edema 08/29/2017   Moderate nonproliferative diabetic retinopathy (HCC) 05/24/2013   Both OS and OD.  Diagnosed by Dr. Altamease Oiler, MD, PhD 05/23/13. Also mild macular edema. Follows with Dr. 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, with long-term current use of insulin (HCC) 12/27/2012        Upper airway cough syndrome 03/22/2019   Patient Active Problem List   Diagnosis Date Noted   Hypotension 05/11/2022   Postural dizziness with presyncope 05/11/2022   Tooth infection 05/11/2022   CKD (chronic kidney disease), stage IV (HCC) 04/29/2022   Costochondral chest pain 04/29/2022   Flank pain 11/20/2020   Pelvic girdle weakness 11/20/2020   Hypertension 11/20/2020   Stage 3b chronic kidney disease (CKD) (HCC) 11/20/2020   Hypersomnia, persistent 09/11/2020   OSA on CPAP 09/11/2020   Uncontrolled type 2 diabetes mellitus with hyperglycemia (HCC) 09/11/2020   PVFS (postviral fatigue syndrome) 09/11/2020   Other allergic rhinitis 03/18/2020   Reactive airway disease 03/18/2020   Adverse reaction to food, subsequent encounter 03/18/2020  Heartburn 03/18/2020   Vitamin D deficiency 09/04/2019   Stable proliferative diabetic retinopathy of both eyes associated with type 2 diabetes mellitus (HCC) 08/17/2019   Current mild episode of major depressive disorder (HCC) 08/08/2019   Sleep-related headache 07/05/2019   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, drug or chemical induced, with severe nonproliferative retinopathy (HCC) 07/05/2019   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, bilateral (HCC) 07/05/2019    Cephalalgia 07/05/2019   Proliferative diabetic retinopathy of right eye (HCC) 06/20/2019   Proliferative diabetic retinopathy of left eye (HCC) 06/20/2019   Vitreous hemorrhage of left eye (HCC) 06/20/2019   DOE (dyspnea on exertion) 05/21/2019   Tachycardia 05/21/2019   History of COVID-19 03/22/2019   Upper airway cough syndrome 03/22/2019   Shortness of breath 03/22/2019   Carpal tunnel syndrome of right wrist 03/13/2019   Gastroesophageal reflux disease 03/23/2018   Lower extremity edema 08/29/2017   Hypertension associated with stage 2 chronic kidney disease due to type 2 diabetes mellitus (HCC) 12/12/2013   Morbid obesity (HCC) 05/25/2013   Type 2 diabetes mellitus with both eyes affected by moderate nonproliferative retinopathy and macular edema, with long-term current use of insulin (HCC) 12/27/2012   Depression 12/27/2012   Past Surgical History:  Procedure Laterality Date   CERVICAL BIOPSY  2004   CESAREAN SECTION N/A 06/02/2012   Procedure: CESAREAN SECTION;  Surgeon: Catalina Antigua, MD;  Location: [facility];  Service: Obstetrics;  Laterality: N/A;   RETINAL DETACHMENT REPAIR W/ SCLERAL BUCKLE LE Left 10/08/2019   OB History     Gravida  1   Para  1   Term  1   Preterm      AB  0   Living  1      SAB  0   IAB      Ectopic      Multiple      Live Births  1          Home Medications    Prior to Admission medications   Medication Sig Start Date End Date Taking? Authorizing Provider  acetaminophen (TYLENOL) 500 MG tablet Take 2 tablets (1,000 mg total) by mouth every 6 (six) hours as needed for mild pain (or Fever >/= 101). 05/15/22   Marguerita Merles Latif, DO  albuterol (VENTOLIN HFA) 108 (90 Base) MCG/ACT inhaler Inhale 1-2 puffs into the lungs every 4 (four) hours as needed for shortness of breath.    [provider]  benzonatate (TESSALON) 200 MG capsule Take 1 capsule (200 mg total) by mouth 3 (three) times daily as needed for cough.  01/22/22   Nyoka Cowden, MD  carvedilol (COREG) 12.5 MG tablet Take 1 tablet (12.5 mg total) by mouth 2 (two) times daily with a meal. 05/15/22   Sheikh, Kateri Mc Latif, DO  cetirizine (ZYRTEC) 5 MG tablet Take 1 tablet (5 mg total) by mouth every 3 (three) days. 05/04/22   Wallis Bamberg, PA-C  CHLOROPHYLL PO Take 18 drops by mouth once a week.    [provider]  Continuous Blood Gluc Transmit (DEXCOM G6 TRANSMITTER) MISC 1  ONCE DAILY FOR 90 DAYS 06/18/21   Autry-Lott, Randa Evens, DO  cyclobenzaprine (FLEXERIL) 5 MG tablet 5 mg at bedtime as needed for muscle spasms. 04/21/22   [provider]  fluticasone (FLONASE) 50 MCG/ACT nasal spray Place 1 spray into both nostrils daily. 04/27/22   Radford Pax, NP  furosemide (LASIX) 20 MG tablet Take 1 tablet (  20 mg total) by mouth daily. 05/16/22   Sheikh, Omair Latif, DO  HUMALOG KWIKPEN 200 UNIT/ML KwikPen INJECT 20 TO 28 UNITS SUBCUTANEOUSLY THREE TIMES DAILY Patient taking differently: INJECT 16 to 20 UNITS SUBCUTANEOUSLY THREE TIMES DAILY 07/28/20   Valetta Close, MD  injection device for insulin (INPEN 100-PINK-NOVO) DEVI Use as directed with novolog cartridges 04/10/20   Janit Pagan T, MD  Insulin Pen Needle (B-D ULTRAFINE III SHORT PEN) 31G X 8 MM MISC 1 Container by Does not apply route as needed. 08/08/19   Melene Plan, MD  levocetirizine (XYZAL) 5 MG tablet Take 5 mg by mouth every evening. 04/15/22   [provider]  meclizine (ANTIVERT) 25 MG tablet Take 1 tablet (25 mg total) by mouth 3 (three) times daily as needed for dizziness. 05/15/22   Sheikh, Omair Latif, DO  OIL OF OREGANO PO Take 1 drop by mouth once a week.    [provider]  oxyCODONE (OXY IR/ROXICODONE) 5 MG immediate release tablet Take 1 tablet (5 mg total) by mouth every 6 (six) hours as needed for moderate pain. 05/15/22   Marguerita Merles Latif, DO  pantoprazole (PROTONIX) 40 MG tablet Take 30- 60 min before your first and last meals of the day 01/22/22    Nyoka Cowden, MD  polyethylene glycol (MIRALAX / GLYCOLAX) 17 g packet Take 17 g by mouth 2 (two) times daily. 05/15/22   Marguerita Merles Latif, DO  promethazine-dextromethorphan (PROMETHAZINE-DM) 6.25-15 MG/5ML syrup Take 5 mLs by mouth 3 (three) times daily as needed for cough. 04/15/22   Wallis Bamberg, PA-C  Semaglutide,0.25 or 0.5MG /DOS, (OZEMPIC, 0.25 OR 0.5 MG/DOSE,) 2 MG/3ML SOPN Inject into the skin.    [provider]  senna-docusate (SENOKOT-S) 8.6-50 MG tablet Take 1 tablet by mouth 2 (two) times daily. 05/15/22   Marguerita Merles Latif, DO  sodium bicarbonate 650 MG tablet Take 1 tablet (650 mg total) by mouth 2 (two) times daily. 05/15/22   Marguerita Merles Latif, DO  gabapentin (NEURONTIN) 100 MG capsule Take 1 capsule (100 mg total) by mouth at bedtime. 08/25/18 01/31/19  Lennox Solders, MD    Family History Family History  Problem Relation Age of Onset   Diabetes Father    Stomach cancer Father    Cirrhosis Father    Cancer Mother    Diabetes Mother    Breast cancer Mother    Rectal cancer Neg Hx    Esophageal cancer Neg Hx    Colon cancer Neg Hx    Social History Social History   Tobacco Use   Smoking status: Never   Smokeless tobacco: Never  Vaping Use   Vaping status: Never Used  Substance Use Topics   Alcohol use: No   Drug use: No   Allergies   Lisinopril, Egg-derived products, and Contrast media [iodinated contrast media]  Review of Systems Review of Systems Pertinent findings revealed after performing a 14 point review of systems has been noted in the history of present illness.  Physical Exam Vital Signs BP (!) 169/103 (BP Location: Left Arm)   Pulse 85   Temp 99.1 F (37.3 C) (Oral)   Resp 18   LMP 07/12/2022 (Exact Date)   SpO2 99%   No data found.  Physical Exam Vitals and nursing note reviewed.  Constitutional:      General: She is awake. She is not in acute distress.    Appearance: Normal appearance. She is well-developed and  well-groomed. She is morbidly  obese. She is not ill-appearing, toxic-appearing or diaphoretic.  HENT:     Head: Normocephalic and atraumatic.     Right Ear: Hearing, tympanic membrane, ear canal and external ear normal.     Left Ear: Hearing, tympanic membrane, ear canal and external ear normal.     Nose: Nose normal.     Mouth/Throat:     Lips: Pink.     Mouth: Mucous membranes are moist.     Pharynx: Oropharynx is clear. Uvula midline.  Eyes:     General: Lids are normal.     Conjunctiva/sclera:     Right eye: Right conjunctiva is not injected.     Left eye: Left conjunctiva is not injected.     Pupils: Pupils are equal, round, and reactive to light.  Cardiovascular:     Rate and Rhythm: Normal rate and regular rhythm.     Pulses: Normal pulses.     Heart sounds: Normal heart sounds, S1 normal and S2 normal. Heart sounds not distant. No murmur heard.    No systolic murmur is present.     No diastolic murmur is present.     No friction rub. No gallop. No S3 or S4 sounds.  Pulmonary:     Effort: Pulmonary effort is normal.     Breath sounds: Normal breath sounds and air entry. No stridor, decreased air movement or transmitted upper airway sounds. No decreased breath sounds, wheezing, rhonchi or rales.  Abdominal:     General: Abdomen is protuberant. Bowel sounds are normal.     Palpations: Abdomen is soft.  Musculoskeletal:        General: Normal range of motion.     Cervical back: Full passive range of motion without pain, normal range of motion and neck supple.     Right lower leg: 2+ Pitting Edema present.     Left lower leg: 2+ Pitting Edema present.  Lymphadenopathy:     Cervical: No cervical adenopathy.  Skin:    General: Skin is warm and dry.  Neurological:     General: No focal deficit present.     Mental Status: She is alert and oriented to person, place, and time. Mental status is at baseline.  Psychiatric:        Mood and Affect: Mood normal.        Behavior:  Behavior normal. Behavior is cooperative.        Thought Content: Thought content normal.        Judgment: Judgment normal.     Visual Acuity Right Eye Distance:   Left Eye Distance:   Bilateral Distance:    Right Eye Near:   Left Eye Near:    Bilateral Near:     UC Couse / Diagnostics / Procedures:     Radiology No results found.  Procedures Procedures (including critical care time) EKG  Pending results:  Labs Reviewed  POCT URINALYSIS DIP (MANUAL ENTRY) - Abnormal; Notable for the following components:      Result Value   Color, UA light yellow (*)    Blood, UA small (*)    Protein Ur, POC >=300 (*)    All other components within normal limits  POCT FASTING CBG [facility] MANUAL ENTRY - Abnormal; Notable for the following components:   POCT Glucose ([facility]) 105 (*)    All other components within normal limits  CBC WITH DIFFERENTIAL/PLATELET  COMPREHENSIVE METABOLIC PANEL  PHOSPHORUS  MAGNESIUM  PREALBUMIN  HEMOGLOBIN A1C    Medications Ordered in  UC: Medications - No data to display  UC Diagnoses / Final Clinical Impressions(s)   I have reviewed the triage vital signs and the nursing notes.  Pertinent labs & imaging results that were available during my care of the patient were reviewed by me and considered in my medical decision making (see chart for details).    Final diagnoses:  CKD (chronic kidney disease), stage IV (HCC)  Type 2 diabetes mellitus treated with insulin (HCC)  Essential hypertension   Blood sugar level today was excellent.  Urinalysis revealed high level of protein but not concerning for UTI.  Given patient's history of CKD stage V, will check CMP, CBC, prealbumin, phosphorus, magnesium and repeat her A1c.  Will notify patient of results once received and patient advised to follow-up with PCP with these results.  For acute treatment of 2+ bilateral pitting edema, patient advised to take furosemide 40 mg twice daily along with Klor-Con 20 mill  equivalents daily for the next 3 days.  Patient advised to significantly decrease sodium intake, watch her sugar, keep her feet elevated.  Patient advised that if these measures are ineffective or symptoms are getting worse she should go to the emergency room for further, more acute evaluation.  Please see discharge instructions below for details of plan of care as provided to patient. ED Prescriptions     Medication Sig Dispense Auth. Provider   furosemide (LASIX) 40 MG tablet Take 1 tablet (40 mg total) by mouth 2 (two) times daily for 3 days. 6 tablet Theadora Rama Scales, PA-C   potassium chloride SA (KLOR-CON M) 20 MEQ tablet Take 1 tablet (20 mEq total) by mouth in the morning for 3 days. 3 tablet Theadora Rama Scales, PA-C      PDMP not reviewed this encounter.  Pending results:  Labs Reviewed  POCT URINALYSIS DIP (MANUAL ENTRY) - Abnormal; Notable for the following components:      Result Value   Color, UA light yellow (*)    Blood, UA small (*)    Protein Ur, POC >=300 (*)    All other components within normal limits  POCT FASTING CBG [facility] MANUAL ENTRY - Abnormal; Notable for the following components:   POCT Glucose ([facility]) 105 (*)    All other components within normal limits  CBC WITH DIFFERENTIAL/PLATELET  COMPREHENSIVE METABOLIC PANEL  PHOSPHORUS  MAGNESIUM  PREALBUMIN  HEMOGLOBIN A1C    Discharge Instructions:   Discharge Instructions      For short-term treatment of excessive swelling in your lower extremities and feelings of shortness of breath, I recommend that you increase your furosemide dose to 80 mg daily for the next 3 days, please take one 40 mg tablet in the morning and evening.  Please be advised, you will be using the bathroom a lot.  Because furosemide is known to cause your potassium levels to drop when taken at high doses, have also provided you with a potassium replacement called potassium chloride.  Please take 1 tablet every morning for the next  3 days.  Your blood sugar level today was excellent.  Your urinalysis was unremarkable.  The result of your blood tests should be available in the next 2 to 3 days.  They will be posted to your MyChart account but you will also receive a phone call advising of the results and any further recommendations based on those results.  In the meantime, please redouble your efforts to keep your diabetes well-controlled and significantly decrease your salt intake to  no more than 2300 mg/day.  Keep your feet elevated is much as possible when seated.  Thank you for visiting [facility] today.  We appreciate the opportunity to participate in your care.      Disposition Upon Discharge:  Condition: stable for discharge home  Patient presented with an acute illness with associated systemic symptoms and significant discomfort requiring urgent management. In my opinion, this is a condition that a prudent lay person (someone who possesses an average knowledge of health and medicine) may potentially expect to result in complications if not addressed urgently such as respiratory distress, impairment of bodily function or dysfunction of bodily organs.   Routine symptom specific, illness specific and/or disease specific instructions were discussed with the patient and/or caregiver at length.   As such, the patient has been evaluated and assessed, work-up was performed and treatment was provided in alignment with urgent care protocols and evidence based medicine.  Patient/parent/caregiver has been advised that the patient may require follow up for further testing and treatment if the symptoms continue in spite of treatment, as clinically indicated and appropriate.  Patient/parent/caregiver has been advised to return to the [facility] or PCP if no better; to PCP or the Emergency Department if new signs and symptoms develop, or if the current signs or symptoms continue to change or worsen for further workup,  evaluation and treatment as clinically indicated and appropriate  The patient will follow up with their current PCP if and as advised. If the patient does not currently have a PCP we will assist them in obtaining one.   The patient may need specialty follow up if the symptoms continue, in spite of conservative treatment and management, for further workup, evaluation, consultation and treatment as clinically indicated and appropriate.  Patient/parent/caregiver verbalized understanding and agreement of plan as discussed.  All questions were addressed during visit.  Please see discharge instructions below for further details of plan.  This office note has been dictated using Teaching laboratory technician.  Unfortunately, this method of dictation can sometimes lead to typographical or grammatical errors.  I apologize for your inconvenience in advance if this occurs.  Please do not hesitate to reach out to me if clarification is needed.      Theadora Rama Scales, [place] 08/06/22 [phone]

## 2022-08-06 NOTE — ED Triage Notes (Signed)
Pt presents with c/o left leg swelling and sob. Pt states she has ben wheezing and coughing.

## 2022-08-17 ENCOUNTER — Ambulatory Visit: Payer: Medicaid Other | Admitting: Skilled Nursing Facility1

## 2022-09-07 ENCOUNTER — Ambulatory Visit
Admission: EM | Admit: 2022-09-07 | Discharge: 2022-09-07 | Disposition: A | Discharge status: Home / Self Care | Payer: Medicaid Other | Attending: Internal Medicine | Admitting: Internal Medicine

## 2022-09-07 DIAGNOSIS — Z794 Long term (current) use of insulin: Secondary | ICD-10-CM | POA: Diagnosis not present

## 2022-09-07 DIAGNOSIS — J4530 Mild persistent asthma, uncomplicated: Secondary | ICD-10-CM | POA: Insufficient documentation

## 2022-09-07 DIAGNOSIS — Z1152 Encounter for screening for COVID-19: Secondary | ICD-10-CM | POA: Diagnosis not present

## 2022-09-07 DIAGNOSIS — Z20828 Contact with and (suspected) exposure to other viral communicable diseases: Secondary | ICD-10-CM | POA: Insufficient documentation

## 2022-09-07 DIAGNOSIS — E119 Type 2 diabetes mellitus without complications: Secondary | ICD-10-CM | POA: Insufficient documentation

## 2022-09-07 DIAGNOSIS — R52 Pain, unspecified: Secondary | ICD-10-CM | POA: Diagnosis present

## 2022-09-07 DIAGNOSIS — N184 Chronic kidney disease, stage 4 (severe): Secondary | ICD-10-CM | POA: Diagnosis not present

## 2022-09-07 DIAGNOSIS — B349 Viral infection, unspecified: Secondary | ICD-10-CM | POA: Diagnosis present

## 2022-09-07 LAB — POCT INFLUENZA A/B
Influenza A, POC: NEGATIVE
Influenza B, POC: NEGATIVE

## 2022-09-07 MED ORDER — IPRATROPIUM BROMIDE 0.03 % NA SOLN: 2.0000 | Freq: Two times a day (BID) | NASAL | 0 refills | Status: AC

## 2022-09-07 MED ORDER — CETIRIZINE HCL 10 MG PO TABS: 10.0000 mg | ORAL_TABLET | Freq: Every day | ORAL | 0 refills | Status: DC

## 2022-09-07 MED ORDER — CETIRIZINE HCL 1 MG/ML PO SOLN: ORAL | 0 refills | Status: AC

## 2022-09-07 MED ORDER — PROMETHAZINE-DM 6.25-15 MG/5ML PO SYRP: 5.0000 mL | ORAL_SOLUTION | Freq: Three times a day (TID) | ORAL | 0 refills | Status: AC | PRN

## 2022-09-07 NOTE — ED Provider Notes (Signed)
[facility]  Note:  This document was prepared using Conservation officer, historic buildings and may include unintentional dictation errors.  MRN: [id number] DOB: 1985-09-29  Subjective:   Karina [name] is a 37 y.o. female presenting for 2 day history of acute onset malaise, fatigue, weakness, shob, coughing. Had exposure to flu B from her daughter. Wants to be tested for this and COVID. No overt chest pain. Has CKD IV, DM II treated with insulin.   No current facility-administered medications for this encounter.  Current Outpatient Medications:    acetaminophen (TYLENOL) 500 MG tablet, Take 2 tablets (1,000 mg total) by mouth every 6 (six) hours as needed for mild pain (or Fever >/= 101)., Disp: 30 tablet, Rfl: 0   albuterol (VENTOLIN HFA) 108 (90 Base) MCG/ACT inhaler, Inhale 1-2 puffs into the lungs every 4 (four) hours as needed for shortness of breath., Disp: , Rfl:    benzonatate (TESSALON) 200 MG capsule, Take 1 capsule (200 mg total) by mouth 3 (three) times daily as needed for cough., Disp: 30 capsule, Rfl: 1   carvedilol (COREG) 12.5 MG tablet, Take 1 tablet (12.5 mg total) by mouth 2 (two) times daily with a meal., Disp: 60 tablet, Rfl: 0   cetirizine (ZYRTEC) 5 MG tablet, Take 1 tablet (5 mg total) by mouth every 3 (three) days., Disp: 30 tablet, Rfl: 0   CHLOROPHYLL PO, Take 18 drops by mouth once a week., Disp: , Rfl:    Continuous Blood Gluc Transmit (DEXCOM G6 TRANSMITTER) MISC, 1  ONCE DAILY FOR 90 DAYS, Disp: 1 each, Rfl: 0   cyclobenzaprine (FLEXERIL) 5 MG tablet, 5 mg at bedtime as needed for muscle spasms., Disp: , Rfl:    fluticasone (FLONASE) 50 MCG/ACT nasal spray, Place 1 spray into both nostrils daily., Disp: 15.8 mL, Rfl: 0   furosemide (LASIX) 20 MG tablet, Take 1 tablet (20 mg total) by mouth daily., Disp: 30 tablet, Rfl: 0   furosemide (LASIX) 40 MG tablet, Take 1 tablet (40 mg total) by mouth 2 (two) times daily for 3 days., Disp: 6 tablet,  Rfl: 0   HUMALOG KWIKPEN 200 UNIT/ML KwikPen, INJECT 20 TO 28 UNITS SUBCUTANEOUSLY THREE TIMES DAILY (Patient taking differently: INJECT 16 to 20 UNITS SUBCUTANEOUSLY THREE TIMES DAILY), Disp: 12 mL, Rfl: 0   injection device for insulin (INPEN 100-PINK-NOVO) DEVI, Use as directed with novolog cartridges, Disp: 1 each, Rfl: 0   Insulin Pen Needle (B-D ULTRAFINE III SHORT PEN) 31G X 8 MM MISC, 1 Container by Does not apply route as needed., Disp: 1 each, Rfl: 11   levocetirizine (XYZAL) 5 MG tablet, Take 5 mg by mouth every evening., Disp: , Rfl:    meclizine (ANTIVERT) 25 MG tablet, Take 1 tablet (25 mg total) by mouth 3 (three) times daily as needed for dizziness., Disp: 30 tablet, Rfl: 0   OIL OF OREGANO PO, Take 1 drop by mouth once a week., Disp: , Rfl:    oxyCODONE (OXY IR/ROXICODONE) 5 MG immediate release tablet, Take 1 tablet (5 mg total) by mouth every 6 (six) hours as needed for moderate pain., Disp: 10 tablet, Rfl: 0   pantoprazole (PROTONIX) 40 MG tablet, Take 30- 60 min before your first and last meals of the day, Disp: 60 tablet, Rfl: 2   polyethylene glycol (MIRALAX / GLYCOLAX) 17 g packet, Take 17 g by mouth 2 (two) times daily., Disp: 14 each, Rfl: 0   potassium chloride SA (KLOR-CON M) 20  MEQ tablet, Take 1 tablet (20 mEq total) by mouth in the morning for 3 days., Disp: 3 tablet, Rfl: 0   promethazine-dextromethorphan (PROMETHAZINE-DM) 6.25-15 MG/5ML syrup, Take 5 mLs by mouth 3 (three) times daily as needed for cough., Disp: 200 mL, Rfl: 0   Semaglutide,0.25 or 0.5MG /DOS, (OZEMPIC, 0.25 OR 0.5 MG/DOSE,) 2 MG/3ML SOPN, Inject into the skin., Disp: , Rfl:    senna-docusate (SENOKOT-S) 8.6-50 MG tablet, Take 1 tablet by mouth 2 (two) times daily., Disp: 60 tablet, Rfl: 0   sodium bicarbonate 650 MG tablet, Take 1 tablet (650 mg total) by mouth 2 (two) times daily., Disp: 60 tablet, Rfl: 0   Allergies  Allergen Reactions   Lisinopril Cough   Egg-Derived Products Nausea And Vomiting    Contrast Media [Iodinated Contrast Media]     Past Medical History:  Diagnosis Date   Acute pain of right shoulder 03/13/2019   Allergy    Angio-edema    Asthma    Back pain 12/12/2017   Carpal tunnel syndrome of right wrist 03/13/2019   COVID-19 01/24/2019   Depression 12/27/2012   Diabetes mellitus    Type 2, insulin resistant   Diabetic neuropathy, type II diabetes mellitus (HCC) 05/23/2013   DOE (dyspnea on exertion) 05/21/2019   Onset with covid 19 infection symptoms started 01/16/19  -  05/23/2019   Walked RA  3 laps @ approx 243ft each @ moderat pace  stopped due to end of study,  no limiting sob/ sats 100% at end despite reported sob at rest    Essential hypertension, benign 12/12/2013   GERD (gastroesophageal reflux disease)    History of COVID-19 03/22/2019   Lower extremity edema 08/29/2017   Moderate nonproliferative diabetic retinopathy (HCC) 05/24/2013   Both OS and OD.  Diagnosed by Dr. Altamease Oiler, MD, PhD 05/23/13. Also mild macular edema. Follows with Dr. 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, with long-term current use of insulin (HCC) 12/27/2012        Upper airway cough syndrome 03/22/2019     Past Surgical History:  Procedure Laterality Date   CERVICAL BIOPSY  2004   CESAREAN SECTION N/A 06/02/2012   Procedure: CESAREAN SECTION;  Surgeon: Catalina Antigua, MD;  Location: [facility];  Service: Obstetrics;  Laterality: N/A;   RETINAL DETACHMENT REPAIR W/ SCLERAL BUCKLE LE Left 10/08/2019    Family History  Problem Relation Age of Onset   Diabetes Father    Stomach cancer Father    Cirrhosis Father    Cancer Mother    Diabetes Mother     Breast cancer Mother    Rectal cancer Neg Hx    Esophageal cancer Neg Hx    Colon cancer Neg Hx     Social History   Tobacco Use   Smoking status: Never   Smokeless tobacco: Never  Vaping Use   Vaping status: Never Used  Substance Use Topics   Alcohol use: No   Drug use: No    ROS   Objective:   Vitals: BP (!) 175/99 (BP Location: Right Arm) Comment: patient states she has not taken BP medication this afternoon  Pulse 84   Temp 99  F (37.2 C) (Oral)   Resp 20   LMP 08/12/2022 (Exact Date)   SpO2 97%   Physical Exam Constitutional:      General: She is not in acute distress.    Appearance: Normal appearance. She is well-developed. She is obese. She is not ill-appearing, toxic-appearing or diaphoretic.  HENT:     Head: Normocephalic and atraumatic.     Right Ear: Tympanic membrane, ear canal and external ear normal. No drainage or tenderness. No middle ear effusion. There is no impacted cerumen. Tympanic membrane is not erythematous or bulging.     Left Ear: Tympanic membrane, ear canal and external ear normal. No drainage or tenderness.  No middle ear effusion. There is no impacted cerumen. Tympanic membrane is not erythematous or bulging.     Nose: Nose normal. No congestion or rhinorrhea.     Mouth/Throat:     Mouth: Mucous membranes are moist. No oral lesions.     Pharynx: No pharyngeal swelling, oropharyngeal exudate, posterior oropharyngeal erythema or uvula swelling.     Tonsils: No tonsillar exudate or tonsillar abscesses. 0 on the right. 0 on the left.  Eyes:     General: No scleral icterus.       Right eye: No discharge.        Left eye: No discharge.     Extraocular Movements: Extraocular movements intact.     Right eye: Normal extraocular motion.     Left eye: Normal extraocular motion.     Conjunctiva/sclera: Conjunctivae normal.  Cardiovascular:     Rate and Rhythm: Normal rate and regular rhythm.     Heart sounds: Normal heart sounds. No murmur  heard.    No friction rub. No gallop.  Pulmonary:     Effort: Pulmonary effort is normal. No respiratory distress.     Breath sounds: No stridor. No decreased breath sounds, wheezing, rhonchi or rales.  Chest:     Chest wall: No tenderness.  Musculoskeletal:     Cervical back: Normal range of motion and neck supple.  Lymphadenopathy:     Cervical: No cervical adenopathy.  Skin:    General: Skin is warm and dry.  Neurological:     General: No focal deficit present.     Mental Status: She is alert and oriented to person, place, and time.  Psychiatric:        Mood and Affect: Mood normal.        Behavior: Behavior normal.    Influenza A/B negative.   Assessment and Plan :   PDMP not reviewed this encounter.  1. Acute viral syndrome   2. Body aches   3. Exposure to influenza   4. Type 2 diabetes mellitus treated with insulin [facility])    Deferred imaging given clear cardiopulmonary exam, hemodynamically stable vital signs. Will manage for viral illness such as viral URI, viral syndrome, viral rhinitis, COVID-19. Recommended supportive care. Offered scripts for symptomatic relief. Testing is pending. Counseled patient on potential for adverse effects with medications prescribed/recommended today, ER and return-to-clinic precautions discussed, patient verbalized understanding.     Wallis Bamberg, [place] 09/08/22 [phone]

## 2022-09-07 NOTE — ED Triage Notes (Signed)
Pt reports headache, cough, legs feels weak, and shortness of breath x 2 days. Pt reports daughter has Influenza B.

## 2022-09-07 NOTE — Discharge Instructions (Addendum)
We will notify you of your test results as they arrive and may take between about 24 hours.  I encourage you to sign up for MyChart if you have not already done so as this can be the easiest way for [place] to communicate results to you online or through a phone app.  Generally, we only contact you if it is a positive test result.  In the meantime, if you develop worsening symptoms including fever, chest pain, shortness of breath despite our current treatment plan then please report to the emergency room as this may be a sign of worsening status from possible viral infection.  Otherwise, we will manage this as a viral syndrome. For sore throat or cough try using a honey-based tea. Use 3 teaspoons of honey with juice squeezed from half lemon. Place shaved pieces of ginger into 1/2-1 cup of water and warm over stove top. Then mix the ingredients and repeat every 4 hours as needed. Please take Tylenol 500mg -650mg  every 6 hours for aches and pains, fevers. Hydrate very well with at least 2 liters of water. Eat light meals such as soups to replenish electrolytes and soft fruits, veggies. Start an antihistamine like Zyrtec (5mg  every other day) for postnasal drainage, sinus congestion.  You can take this together with Atrovent nasal spray 2 times a day as needed for the same kind of congestion.  Use the cough medications as needed.

## 2022-09-08 LAB — SARS CORONAVIRUS 2 (TAT 6-24 HRS): SARS Coronavirus 2: NEGATIVE

## 2022-09-20 ENCOUNTER — Other Ambulatory Visit: Payer: Self-pay | Admitting: Internal Medicine

## 2022-11-18 ENCOUNTER — Other Ambulatory Visit: Payer: Self-pay | Admitting: Internal Medicine

## 2022-12-17 ENCOUNTER — Ambulatory Visit: Payer: Medicaid Other | Admitting: Podiatry

## 2022-12-28 ENCOUNTER — Other Ambulatory Visit: Payer: Self-pay | Admitting: Internal Medicine

## 2022-12-28 NOTE — Telephone Encounter (Signed)
Pt will need an appointment for further refills.  

## 2023-01-30 ENCOUNTER — Other Ambulatory Visit: Payer: Self-pay

## 2023-01-30 ENCOUNTER — Ambulatory Visit
Admission: EM | Admit: 2023-01-30 | Discharge: 2023-01-30 | Disposition: A | Discharge status: Home / Self Care | Payer: Medicaid Other | Attending: Physician Assistant | Admitting: Physician Assistant

## 2023-01-30 ENCOUNTER — Encounter (HOSPITAL_COMMUNITY): Payer: Self-pay | Admitting: Emergency Medicine

## 2023-01-30 ENCOUNTER — Emergency Department (HOSPITAL_COMMUNITY)
Admission: EM | Admit: 2023-01-30 | Discharge: 2023-01-30 | Disposition: A | Discharge status: Home / Self Care | Payer: Medicaid Other | Attending: Emergency Medicine | Admitting: Emergency Medicine

## 2023-01-30 ENCOUNTER — Emergency Department (HOSPITAL_COMMUNITY): Payer: Medicaid Other

## 2023-01-30 ENCOUNTER — Encounter: Payer: Self-pay | Admitting: Emergency Medicine

## 2023-01-30 DIAGNOSIS — Z794 Long term (current) use of insulin: Secondary | ICD-10-CM | POA: Insufficient documentation

## 2023-01-30 DIAGNOSIS — J101 Influenza due to other identified influenza virus with other respiratory manifestations: Secondary | ICD-10-CM | POA: Insufficient documentation

## 2023-01-30 DIAGNOSIS — R079 Chest pain, unspecified: Secondary | ICD-10-CM

## 2023-01-30 DIAGNOSIS — R0602 Shortness of breath: Secondary | ICD-10-CM | POA: Diagnosis not present

## 2023-01-30 DIAGNOSIS — Z20822 Contact with and (suspected) exposure to covid-19: Secondary | ICD-10-CM | POA: Diagnosis not present

## 2023-01-30 LAB — COMPREHENSIVE METABOLIC PANEL
ALT: 11 U/L (ref 0–44)
AST: 16 U/L (ref 15–41)
Albumin: 3.1 g/dL — ABNORMAL LOW (ref 3.5–5.0)
Alkaline Phosphatase: 79 U/L (ref 38–126)
Anion gap: 8 (ref 5–15)
BUN: 19 mg/dL (ref 6–20)
CO2: 21 mmol/L — ABNORMAL LOW (ref 22–32)
Calcium: 8.9 mg/dL (ref 8.9–10.3)
Chloride: 110 mmol/L (ref 98–111)
Creatinine, Ser: 4.49 mg/dL — ABNORMAL HIGH (ref 0.44–1.00)
GFR, Estimated: 12 mL/min — ABNORMAL LOW (ref >60)
Glucose, Bld: 64 mg/dL — ABNORMAL LOW (ref 70–99)
Potassium: 4.3 mmol/L (ref 3.5–5.1)
Sodium: 139 mmol/L (ref 135–145)
Total Bilirubin: 0.6 mg/dL (ref 0.0–1.2)
Total Protein: 6.8 g/dL (ref 6.5–8.1)

## 2023-01-30 LAB — CBC WITH DIFFERENTIAL/PLATELET
Abs Immature Granulocytes: 0.02 10*3/uL (ref 0.00–0.07)
Basophils Absolute: 0 10*3/uL (ref 0.0–0.1)
Basophils Relative: 0 %
Eosinophils Absolute: 0.1 10*3/uL (ref 0.0–0.5)
Eosinophils Relative: 2 %
HCT: 29.2 % — ABNORMAL LOW (ref 36.0–46.0)
Hemoglobin: 9.5 g/dL — ABNORMAL LOW (ref 12.0–15.0)
Immature Granulocytes: 0 %
Lymphocytes Relative: 7 %
Lymphs Abs: 0.4 10*3/uL — ABNORMAL LOW (ref 0.7–4.0)
MCH: 27.5 pg (ref 26.0–34.0)
MCHC: 32.5 g/dL (ref 30.0–36.0)
MCV: 84.4 fL (ref 80.0–100.0)
Monocytes Absolute: 0.2 10*3/uL (ref 0.1–1.0)
Monocytes Relative: 4 %
Neutro Abs: 4.3 10*3/uL (ref 1.7–7.7)
Neutrophils Relative %: 87 %
Platelets: 332 10*3/uL (ref 150–400)
RBC: 3.46 MIL/uL — ABNORMAL LOW (ref 3.87–5.11)
RDW: 13.4 % (ref 11.5–15.5)
WBC: 5 10*3/uL (ref 4.0–10.5)
nRBC: 0 % (ref 0.0–0.2)

## 2023-01-30 LAB — RESP PANEL BY RT-PCR (RSV, FLU A&B, COVID)  RVPGX2
Influenza A by PCR: POSITIVE — AB
Influenza B by PCR: NEGATIVE
Resp Syncytial Virus by PCR: NEGATIVE
SARS Coronavirus 2 by RT PCR: NEGATIVE

## 2023-01-30 LAB — CBG MONITORING, ED
Glucose-Capillary: 53 mg/dL — ABNORMAL LOW (ref 70–99)
Glucose-Capillary: 70 mg/dL (ref 70–99)

## 2023-01-30 LAB — PREGNANCY, URINE: Preg Test, Ur: NEGATIVE

## 2023-01-30 LAB — TROPONIN I (HIGH SENSITIVITY)
Troponin I (High Sensitivity): 20 ng/L — ABNORMAL HIGH (ref <18)
Troponin I (High Sensitivity): 20 ng/L — ABNORMAL HIGH (ref <18)

## 2023-01-30 MED ORDER — SODIUM CHLORIDE 0.9 % IV BOLUS
1000.0000 mL | Freq: Once | INTRAVENOUS | Status: AC
  Administered 2023-01-30: 1000 mL via INTRAVENOUS

## 2023-01-30 MED ORDER — KETOROLAC TROMETHAMINE 15 MG/ML IJ SOLN
15.0000 mg | Freq: Once | INTRAMUSCULAR | Status: AC
  Administered 2023-01-30: 15 mg via INTRAVENOUS
  Filled 2023-01-30: qty 1

## 2023-01-30 MED ORDER — ACETAMINOPHEN 500 MG PO TABS
1000.0000 mg | ORAL_TABLET | Freq: Once | ORAL | Status: AC
  Administered 2023-01-30: 1000 mg via ORAL
  Filled 2023-01-30: qty 2

## 2023-01-30 MED ORDER — OSELTAMIVIR PHOSPHATE 30 MG PO CAPS
30.0000 mg | ORAL_CAPSULE | Freq: Once | ORAL | Status: AC
  Administered 2023-01-30: 30 mg via ORAL
  Filled 2023-01-30: qty 1

## 2023-01-30 MED ORDER — OSELTAMIVIR PHOSPHATE 30 MG PO CAPS: 30.0000 mg | ORAL_CAPSULE | Freq: Every day | ORAL | 0 refills | Status: AC

## 2023-01-30 NOTE — ED Provider Notes (Signed)
[facility]    CSN: [id number] Arrival date & time: 01/30/23  0904      History   Chief Complaint Chief Complaint  Patient presents with   Chest Pain    HPI Karina [name] is a 38 y.o. female.   Patient here today for evaluation of left-sided chest pain that started today.  She reports that pain is intermittent.  She reports issues with her heart in the past.  She does have significant history of uncontrolled diabetes and stage IV kidney disease.  She reports that since developing chest pain she has had some shortness of breath and feeling some difficulty breathing.  She denies any lightheadedness or headaches.  She does report she has had some congestion that started yesterday but has not had cough.  The history is provided by the patient.  Chest Pain Associated symptoms: shortness of breath   Associated symptoms: no cough, no dizziness, no fever, no headache, no nausea, no numbness and no vomiting     Past Medical History:  Diagnosis Date   Acute pain of right shoulder 03/13/2019   Allergy    Angio-edema    Asthma    Back pain 12/12/2017   Carpal tunnel syndrome of right wrist 03/13/2019   COVID-19 01/24/2019   Depression 12/27/2012   Diabetes mellitus    Type 2, insulin resistant   Diabetic neuropathy, type II diabetes mellitus (HCC) 05/23/2013   DOE (dyspnea on exertion) 05/21/2019   Onset with covid 19 infection symptoms started 01/16/19  -  05/23/2019   Walked RA  3 laps @ approx 262ft each @ moderat pace  stopped due to end of study,  no limiting sob/ sats 100% at end despite reported sob at rest    Essential hypertension, benign 12/12/2013   GERD (gastroesophageal reflux disease)    History of COVID-19 03/22/2019   Lower extremity edema 08/29/2017   Moderate nonproliferative diabetic retinopathy (HCC) 05/24/2013   Both OS and OD.  Diagnosed by Dr. Altamease Oiler, MD, PhD 05/23/13. Also mild macular edema. Follows with Dr. Dione Booze   Morbid obesity (HCC)  05/25/2013   Nephrotic syndrome due to diabetes mellitus (HCC) 04/10/2019   Notalgia 09/11/2015   Shortness of breath 03/22/2019   Sleep apnea    doesn't have machine yet   Syncope 04/08/2015   Tachycardia 05/21/2019   Type 2 diabetes mellitus with both eyes affected by moderate nonproliferative retinopathy and macular edema, with long-term current use of insulin (HCC) 12/27/2012        Upper airway cough syndrome 03/22/2019    Patient Active Problem List   Diagnosis Date Noted   Hypotension 05/11/2022   Postural dizziness with presyncope 05/11/2022   Tooth infection 05/11/2022   CKD (chronic kidney disease), stage IV (HCC) 04/29/2022   Costochondral chest pain 04/29/2022   Flank pain 11/20/2020   Pelvic girdle weakness 11/20/2020   Hypertension 11/20/2020   Stage 3b chronic kidney disease (CKD) (HCC) 11/20/2020   Hypersomnia, persistent 09/11/2020   OSA on CPAP 09/11/2020   Uncontrolled type 2 diabetes mellitus with hyperglycemia (HCC) 09/11/2020   PVFS (postviral fatigue syndrome) 09/11/2020   Other allergic rhinitis 03/18/2020   Reactive airway disease 03/18/2020   Adverse reaction to food, subsequent encounter 03/18/2020   Heartburn 03/18/2020   Vitamin D deficiency 09/04/2019   Stable proliferative diabetic retinopathy of both eyes associated with type 2 diabetes mellitus (HCC) 08/17/2019   Current mild episode of major depressive disorder (HCC) 08/08/2019   Sleep-related headache  07/05/2019   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, drug or chemical induced, with severe nonproliferative retinopathy (HCC) 07/05/2019   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, bilateral (HCC) 07/05/2019   Cephalalgia 07/05/2019   Proliferative diabetic retinopathy of right eye (HCC) 06/20/2019   Proliferative diabetic retinopathy of left eye (HCC) 06/20/2019   Vitreous hemorrhage of left eye (HCC) 06/20/2019   DOE (dyspnea on exertion)  05/21/2019   Tachycardia 05/21/2019   History of COVID-19 03/22/2019   Upper airway cough syndrome 03/22/2019   Shortness of breath 03/22/2019   Carpal tunnel syndrome of right wrist 03/13/2019   Gastroesophageal reflux disease 03/23/2018   Lower extremity edema 08/29/2017   Hypertension associated with stage 2 chronic kidney disease due to type 2 diabetes mellitus (HCC) 12/12/2013   Morbid obesity (HCC) 05/25/2013   Type 2 diabetes mellitus with both eyes affected by moderate nonproliferative retinopathy and macular edema, with long-term current use of insulin (HCC) 12/27/2012   Depression 12/27/2012    Past Surgical History:  Procedure Laterality Date   CERVICAL BIOPSY  2004   CESAREAN SECTION N/A 06/02/2012   Procedure: CESAREAN SECTION;  Surgeon: Catalina Antigua, MD;  Location: [facility];  Service: Obstetrics;  Laterality: N/A;   RETINAL DETACHMENT REPAIR W/ SCLERAL BUCKLE LE Left 10/08/2019    OB History     Gravida  1   Para  1   Term  1   Preterm      AB  0   Living  1      SAB  0   IAB      Ectopic      Multiple      Live Births  1            Home Medications    Prior to Admission medications   Medication Sig Start Date End Date Taking? Authorizing Provider  acetaminophen (TYLENOL) 500 MG tablet Take 2 tablets (1,000 mg total) by mouth every 6 (six) hours as needed for mild pain (or Fever >/= 101). 05/15/22   Marguerita Merles Latif, DO  albuterol (VENTOLIN HFA) 108 (90 Base) MCG/ACT inhaler Inhale 1-2 puffs into the lungs every 4 (four) hours as needed for shortness of breath.    [provider]  benzonatate (TESSALON) 200 MG capsule Take 1 capsule (200 mg total) by mouth 3 (three) times daily as needed for cough. 01/22/22   Nyoka Cowden, MD  carvedilol (COREG) 12.5 MG tablet Take 1 tablet (12.5 mg total) by mouth 2 (two) times daily with a meal. 05/15/22   Sheikh, Kateri Mc Latif, DO  cetirizine HCl (ZYRTEC) 1 MG/ML solution Take 5mg  every other day.  09/07/22   Wallis Bamberg, PA-C  CHLOROPHYLL PO Take 18 drops by mouth once a week.    [provider]  Continuous Blood Gluc Transmit (DEXCOM G6 TRANSMITTER) MISC 1  ONCE DAILY FOR 90 DAYS 06/18/21   Autry-Lott, Randa Evens, DO  cyclobenzaprine (FLEXERIL) 5 MG tablet 5 mg at bedtime as needed for muscle spasms. 04/21/22   [provider]  fluticasone (FLONASE) 50 MCG/ACT nasal spray Place 1 spray into both nostrils daily. 04/27/22   Radford Pax, NP  furosemide (LASIX) 20 MG tablet Take 1 tablet (20 mg total) by mouth daily. 05/16/22   Marguerita Merles Latif, DO  furosemide (LASIX) 40 MG tablet Take 1 tablet (40 mg total) by mouth 2 (two) times daily for 3 days. 08/06/22 08/09/22  Theadora Rama Scales, PA-C  HUMALOG KWIKPEN 200 UNIT/ML KwikPen INJECT 20 TO 28 UNITS SUBCUTANEOUSLY THREE TIMES DAILY Patient taking differently: INJECT 16 to 20 UNITS SUBCUTANEOUSLY THREE TIMES DAILY 07/28/20   Valetta Close, MD  injection device for insulin (INPEN 100-PINK-NOVO) DEVI Use as directed with novolog cartridges 04/10/20   Janit Pagan T, MD  Insulin Pen Needle (B-D ULTRAFINE III SHORT PEN) 31G X 8 MM MISC 1 Container by Does not apply route as needed. 08/08/19   Melene Plan, MD  ipratropium (ATROVENT) 0.03 % nasal spray Place 2 sprays into both nostrils 2 (two) times daily. 09/07/22   Wallis Bamberg, PA-C  levocetirizine (XYZAL) 5 MG tablet Take 5 mg by mouth every evening. 04/15/22   [provider]  meclizine (ANTIVERT) 25 MG tablet Take 1 tablet (25 mg total) by mouth 3 (three) times daily as needed for dizziness. 05/15/22   Sheikh, Omair Latif, DO  OIL OF OREGANO PO Take 1 drop by mouth once a week.    [provider]  oxyCODONE (OXY IR/ROXICODONE) 5 MG immediate release tablet Take 1 tablet (5 mg total) by mouth every 6 (six) hours as needed for moderate pain. 05/15/22   Sheikh, Omair Latif, DO  pantoprazole (PROTONIX) 40 MG tablet TAKE 1 TABLET BY MOUTH TWICE DAILY 30-60  MINS  BEFORE   YOUR  FIRST  AND  LAST  MEALS  OF  THE  DAY 12/28/22   Nyoka Cowden, MD  polyethylene glycol (MIRALAX / GLYCOLAX) 17 g packet Take 17 g by mouth 2 (two) times daily. 05/15/22   Marguerita Merles Latif, DO  potassium chloride SA (KLOR-CON M) 20 MEQ tablet Take 1 tablet (20 mEq total) by mouth in the morning for 3 days. 08/06/22 08/09/22  Theadora Rama Scales, PA-C  promethazine-dextromethorphan (PROMETHAZINE-DM) 6.25-15 MG/5ML syrup Take 5 mLs by mouth 3 (three) times daily as needed for cough. 09/07/22   Wallis Bamberg, PA-C  Semaglutide,0.25 or 0.5MG /DOS, (OZEMPIC, 0.25 OR 0.5 MG/DOSE,) 2 MG/3ML SOPN Inject into the skin.    [provider]  senna-docusate (SENOKOT-S) 8.6-50 MG tablet Take 1 tablet by mouth 2 (two) times daily. 05/15/22   Marguerita Merles Latif, DO  sodium bicarbonate 650 MG tablet Take 1 tablet (650 mg total) by mouth 2 (two) times daily. 05/15/22   Marguerita Merles Latif, DO  gabapentin (NEURONTIN) 100 MG capsule Take 1 capsule (100 mg total) by mouth at bedtime. 08/25/18 01/31/19  Lennox Solders, MD    Family History Family History  Problem Relation Age of Onset   Diabetes Father    Stomach cancer Father    Cirrhosis Father    Cancer Mother    Diabetes Mother    Breast cancer Mother    Rectal cancer Neg Hx    Esophageal cancer Neg Hx    Colon cancer Neg Hx     Social History Social History   Tobacco Use   Smoking status: Never   Smokeless tobacco: Never  Vaping Use   Vaping status: Never Used  Substance Use Topics   Alcohol use: No   Drug use: No     Allergies   Lisinopril, Egg-derived products, and Contrast media [iodinated contrast media]   Review of Systems Review of Systems  Constitutional:  Negative for chills and fever.  HENT:  Positive for congestion.   Eyes:  Negative for discharge and redness.  Respiratory:  Positive for shortness of breath. Negative for cough.   Cardiovascular:  Positive for chest pain.  Gastrointestinal:  Negative for nausea  and vomiting.  Neurological:  Negative for dizziness, light-headedness, numbness and headaches.     Physical Exam Triage Vital Signs ED Triage Vitals  Encounter Vitals Group     BP 01/30/23 0920 129/77     Systolic BP Percentile --      Diastolic BP Percentile --      Pulse Rate 01/30/23 0920 93     Resp 01/30/23 0920 18     Temp 01/30/23 0920 99.1 F (37.3 C)     Temp Source 01/30/23 0920 Oral     SpO2 01/30/23 0920 95 %     Weight --      Height --      Head Circumference --      Peak Flow --      Pain Score 01/30/23 0921 8     Pain Loc --      Pain Education --      Exclude from Growth Chart --    No data found.  Updated Vital Signs BP 129/77 (BP Location: Right Arm)   Pulse 93   Temp 99.1 F (37.3 C) (Oral)   Resp 18   SpO2 95%   Visual Acuity Right Eye Distance:   Left Eye Distance:   Bilateral Distance:    Right Eye Near:   Left Eye Near:    Bilateral Near:     Physical Exam Vitals and nursing note reviewed.  Constitutional:      General: She is in acute distress (appears to be uncomfortable).     Appearance: Normal appearance. She is not ill-appearing.  HENT:     Head: Normocephalic and atraumatic.  Eyes:     Conjunctiva/sclera: Conjunctivae normal.  Cardiovascular:     Rate and Rhythm: Normal rate and regular rhythm.  Pulmonary:     Effort: Pulmonary effort is normal. No respiratory distress.     Breath sounds: Normal breath sounds. No wheezing, rhonchi or rales.  Neurological:     Mental Status: She is alert.  Psychiatric:        Mood and Affect: Mood normal.        Behavior: Behavior normal.        Thought Content: Thought content normal.      UC Treatments / Results  Labs (all labs ordered are listed, but only abnormal results are displayed) Labs Reviewed - No data to display  EKG   Radiology No results found.  Procedures Procedures (including critical care time)  Medications Ordered in UC Medications - No data to  display  Initial Impression / Assessment and Plan / UC Course  I have reviewed the triage vital signs and the nursing notes.  Pertinent labs & imaging results that were available during my care of the patient were reviewed by me and considered in my medical decision making (see chart for details).    EKG without changes compared to prior. Advised further evaluation in the ED given co-morbidities and reported shortness of breath. Patient is agreeable- prefers EMS transport. [facility] notified of need for transport.  Final Clinical Impressions(s) / UC Diagnoses   Final diagnoses:  Chest pain, unspecified type  Shortness of breath   Discharge Instructions   None    ED Prescriptions   None    PDMP not reviewed this encounter.   Tomi Bamberger, PA-C 01/30/23 [phone]

## 2023-01-30 NOTE — ED Provider Notes (Signed)
[place] EMERGENCY DEPARTMENT AT [facility] Provider Note   CSN: [id number] Arrival date & time: 01/30/23  1049     History  Chief Complaint  Patient presents with   Chest Pain    Karina [name] is a 38 y.o. female.  HPI 38 year old female presents with chest pain.  Chest pain started this morning.  She has had a little bit of a cough this morning and had a sore throat that started yesterday.  She did not feel like she had a fever but has had a low-grade temp in the emergency department in urgent care.  She states the chest pain she is having is an aching sensation in her left chest that she has had before when she has missed blood pressure doses but typically goes away when she takes her blood pressure meds.  She took her blood pressure meds this morning and it did not improve.  She states the pain is also worse than typical.  No pleuritic symptoms.  She denies vomiting, diarrhea.  She does feel short of breath and her left arm is hurting. No leg swelling, calf pain or recent travel/surgery.   Home Medications Prior to Admission medications   Medication Sig Start Date End Date Taking? Authorizing Provider  oseltamivir (TAMIFLU) 30 MG capsule Take 1 capsule (30 mg total) by mouth daily for 4 days. 01/31/23 02/04/23 Yes Pricilla Loveless, MD  acetaminophen (TYLENOL) 500 MG tablet Take 2 tablets (1,000 mg total) by mouth every 6 (six) hours as needed for mild pain (or Fever >/= 101). 05/15/22   Marguerita Merles Latif, DO  albuterol (VENTOLIN HFA) 108 (90 Base) MCG/ACT inhaler Inhale 1-2 puffs into the lungs every 4 (four) hours as needed for shortness of breath.    [provider]  benzonatate (TESSALON) 200 MG capsule Take 1 capsule (200 mg total) by mouth 3 (three) times daily as needed for cough. 01/22/22   Nyoka Cowden, MD  carvedilol (COREG) 12.5 MG tablet Take 1 tablet (12.5 mg total) by mouth 2 (two) times daily with a meal. 05/15/22   Sheikh, Kateri Mc Latif, DO   cetirizine HCl (ZYRTEC) 1 MG/ML solution Take 5mg  every other day. 09/07/22   Wallis Bamberg, PA-C  CHLOROPHYLL PO Take 18 drops by mouth once a week.    [provider]  Continuous Blood Gluc Transmit (DEXCOM G6 TRANSMITTER) MISC 1  ONCE DAILY FOR 90 DAYS 06/18/21   Autry-Lott, Randa Evens, DO  cyclobenzaprine (FLEXERIL) 5 MG tablet 5 mg at bedtime as needed for muscle spasms. 04/21/22   [provider]  fluticasone (FLONASE) 50 MCG/ACT nasal spray Place 1 spray into both nostrils daily. 04/27/22   Radford Pax, NP  furosemide (LASIX) 20 MG tablet Take 1 tablet (20 mg total) by mouth daily. 05/16/22   Marguerita Merles Latif, DO  furosemide (LASIX) 40 MG tablet Take 1 tablet (40 mg total) by mouth 2 (two) times daily for 3 days. 08/06/22 08/09/22  Theadora Rama Scales, PA-C  HUMALOG KWIKPEN 200 UNIT/ML KwikPen INJECT 20 TO 28 UNITS SUBCUTANEOUSLY THREE TIMES DAILY Patient taking differently: INJECT 16 to 20 UNITS SUBCUTANEOUSLY THREE TIMES DAILY 07/28/20   Valetta Close, MD  injection device for insulin (INPEN 100-PINK-NOVO) DEVI Use as directed with novolog cartridges 04/10/20   Doreene Eland, MD  Insulin Pen Needle (B-D ULTRAFINE III SHORT PEN) 31G X 8 MM MISC 1 Container by Does not apply route as needed. 08/08/19   Melene Plan, MD  ipratropium (ATROVENT) 0.03 % nasal spray Place 2 sprays into both nostrils 2 (two) times daily. 09/07/22   Wallis Bamberg, PA-C  levocetirizine (XYZAL) 5 MG tablet Take 5 mg by mouth every evening. 04/15/22   [provider]  meclizine (ANTIVERT) 25 MG tablet Take 1 tablet (25 mg total) by mouth 3 (three) times daily as needed for dizziness. 05/15/22   Sheikh, Omair Latif, DO  OIL OF OREGANO PO Take 1 drop by mouth once a week.    [provider]  oxyCODONE (OXY IR/ROXICODONE) 5 MG immediate release tablet Take 1 tablet (5 mg total) by mouth every 6 (six) hours as needed for moderate pain. 05/15/22   Sheikh, Omair Latif, DO  pantoprazole (PROTONIX)  40 MG tablet TAKE 1 TABLET BY MOUTH TWICE DAILY 30-60  MINS  BEFORE  YOUR  FIRST  AND  LAST  MEALS  OF  THE  DAY 12/28/22   Nyoka Cowden, MD  polyethylene glycol (MIRALAX / GLYCOLAX) 17 g packet Take 17 g by mouth 2 (two) times daily. 05/15/22   Marguerita Merles Latif, DO  potassium chloride SA (KLOR-CON M) 20 MEQ tablet Take 1 tablet (20 mEq total) by mouth in the morning for 3 days. 08/06/22 08/09/22  Theadora Rama Scales, PA-C  promethazine-dextromethorphan (PROMETHAZINE-DM) 6.25-15 MG/5ML syrup Take 5 mLs by mouth 3 (three) times daily as needed for cough. 09/07/22   Wallis Bamberg, PA-C  Semaglutide,0.25 or 0.5MG /DOS, (OZEMPIC, 0.25 OR 0.5 MG/DOSE,) 2 MG/3ML SOPN Inject into the skin.    [provider]  senna-docusate (SENOKOT-S) 8.6-50 MG tablet Take 1 tablet by mouth 2 (two) times daily. 05/15/22   Marguerita Merles Latif, DO  sodium bicarbonate 650 MG tablet Take 1 tablet (650 mg total) by mouth 2 (two) times daily. 05/15/22   Marguerita Merles Latif, DO  gabapentin (NEURONTIN) 100 MG capsule Take 1 capsule (100 mg total) by mouth at bedtime. 08/25/18 01/31/19  Lennox Solders, MD      Allergies    Lisinopril, Egg-derived products, and Contrast media [iodinated contrast media]    Review of Systems   Review of Systems  Constitutional:  Negative for fever.  HENT:  Positive for sore throat.   Respiratory:  Positive for cough and shortness of breath.   Cardiovascular:  Positive for chest pain. Negative for leg swelling.  Gastrointestinal:  Negative for diarrhea and vomiting.    Physical Exam Updated Vital Signs BP (!) 141/75   Pulse 97   Temp 100.3 F (37.9 C) (Oral)   Resp 14   SpO2 100%  Physical Exam Vitals and nursing note reviewed. Exam conducted with a chaperone present.  Constitutional:      Appearance: She is well-developed.  HENT:     Head: Normocephalic and atraumatic.  Cardiovascular:     Rate and Rhythm: Normal rate and regular rhythm.     Pulses:          Radial pulses  are 2+ on the left side.     Heart sounds: Normal heart sounds.  Pulmonary:     Effort: Pulmonary effort is normal.     Breath sounds: Normal breath sounds.  Chest:     Chest wall: Tenderness present.    Abdominal:     Palpations: Abdomen is soft.     Tenderness: There is no abdominal tenderness.  Musculoskeletal:     Right lower leg: No tenderness. No edema.     Left lower leg: No tenderness. No edema.  Comments: No tenderness or swelling appreciated to left upper arm or forearm. No pain with ROM of shoulder. Normal grip strength in left hand.  Skin:    General: Skin is warm and dry.  Neurological:     Mental Status: She is alert.     Comments: 5/5 strength in BUE. Grossly normal sensation bilaterally     ED Results / Procedures / Treatments   Labs (all labs ordered are listed, but only abnormal results are displayed) Labs Reviewed  RESP PANEL BY RT-PCR (RSV, FLU A&B, COVID)  RVPGX2 - Abnormal; Notable for the following components:      Result Value   Influenza A by PCR POSITIVE (*)    All other components within normal limits  COMPREHENSIVE METABOLIC PANEL - Abnormal; Notable for the following components:   CO2 21 (*)    Glucose, Bld 64 (*)    Creatinine, Ser 4.49 (*)    Albumin 3.1 (*)    GFR, Estimated 12 (*)    All other components within normal limits  CBC WITH DIFFERENTIAL/PLATELET - Abnormal; Notable for the following components:   RBC 3.46 (*)    Hemoglobin 9.5 (*)    HCT 29.2 (*)    Lymphs Abs 0.4 (*)    All other components within normal limits  CBG MONITORING, ED - Abnormal; Notable for the following components:   Glucose-Capillary 53 (*)    All other components within normal limits  TROPONIN I (HIGH SENSITIVITY) - Abnormal; Notable for the following components:   Troponin I (High Sensitivity) 20 (*)    All other components within normal limits  TROPONIN I (HIGH SENSITIVITY) - Abnormal; Notable for the following components:   Troponin I (High  Sensitivity) 20 (*)    All other components within normal limits  PREGNANCY, URINE  CBG MONITORING, ED    EKG EKG Interpretation Date/Time:  Sunday January 30 2023 10:58:32 EST Ventricular Rate:  98 PR Interval:  159 QRS Duration:  84 QT Interval:  328 QTC Calculation: 419 R Axis:   -1  Text Interpretation: Sinus rhythm Low voltage, precordial leads Borderline T abnormalities, lateral leads  Confirmed by Pricilla Loveless [id number]) on 01/30/2023 11:01:53 AM  Radiology DG Chest 2 View Result Date: 01/30/2023 CLINICAL DATA:  Chest pain and cough. EXAM: CHEST - 2 VIEW COMPARISON:  05/13/2022 FINDINGS: Lungs are adequately inflated and otherwise clear. Cardiomediastinal silhouette and remainder of the exam is unchanged. IMPRESSION: No active cardiopulmonary disease. Electronically Signed   By: Elberta Fortis M.D.   On: 01/30/2023 12:25    Procedures Procedures    Medications Ordered in ED Medications  oseltamivir (TAMIFLU) capsule 30 mg (has no administration in time range)  acetaminophen (TYLENOL) tablet 1,000 mg (1,000 mg Oral Given 01/30/23 1218)  ketorolac (TORADOL) 15 MG/ML injection 15 mg (15 mg Intravenous Given 01/30/23 1217)  sodium chloride 0.9 % bolus 1,000 mL (1,000 mLs Intravenous New Bag/Given 01/30/23 1406)    ED Course/ Medical Decision Making/ A&P                                 Medical Decision Making Amount and/or Complexity of Data Reviewed Labs: ordered.    Details: Transient hypoglycemia, improved with food.  Radiology: ordered and independent interpretation performed.    Details: No Pneumonia ECG/medicine tests: ordered and independent interpretation performed.    Details: No ischemia  Risk OTC drugs. Prescription drug management.   Patient's symptoms  ultimately appear to be coming from the flu.  Troponins are slightly elevated but flat.  More consistent with her renal disease.  White blood cell count is normal.  She is not hypoxic or having signs of  pneumonia.  She is currently feeling better with treatments.  She was given a dose of Toradol along with Tylenol prior to me seeing the renal function.  I had not known that she had chronic kidney disease prior to this dose.  I did discuss this with her and let her know she did receive a dose of NSAIDs that she should withhold from further NSAIDs due to her renal dysfunction (which does appear baseline), increase fluids (we did give her a bolus of fluids here as well) and follow-up in the next few days to get her renal function rechecked with her PCP.  She feels comfortable with this plan and is feeling a lot better and would like to go home.  Low suspicion for PE, ACS, dissection, bacterial infection, etc.  After discussion, she would like to be treated with Tamiflu and on my calculation her creatinine clearance is 29 so she will get once daily 30 mg Tamiflu dosing.  Given the first dose here.  Given return precautions.        Final Clinical Impression(s) / ED Diagnoses Final diagnoses:  Influenza A    Rx / DC Orders ED Discharge Orders          Ordered    oseltamivir (TAMIFLU) 30 MG capsule  Daily        01/30/23 1454              Pricilla Loveless, MD 01/30/23 1544

## 2023-01-30 NOTE — ED Notes (Signed)
Pt ambulatory to waiting room. Pt verbalized understanding of discharge instructions.   

## 2023-01-30 NOTE — ED Notes (Signed)
Patient is being discharged from the Urgent Care and sent to the Emergency Department via EMS . Per RM, patient is in need of higher level of care due to CP/SOB. Patient is aware and verbalizes understanding of plan of care.  Vitals:   01/30/23 0920  BP: 129/77  Pulse: 93  Resp: 18  Temp: 99.1 F (37.3 C)  SpO2: 95%

## 2023-01-30 NOTE — ED Provider Notes (Signed)
.  Ultrasound ED Peripheral IV (Provider)  Date/Time: 01/30/2023 11:38 AM  Performed by: Netta Corrigan, PA-C Authorized by: Netta Corrigan, PA-C   Procedure details:    Indications: poor IV access     Skin Prep: isopropyl alcohol     Location:  Right AC   Angiocath:  18 G   Bedside Ultrasound Guided: Yes     Images: archived     Patient tolerated procedure without complications: Yes     Dressing applied: Yes       Netta Corrigan, PA-C 01/30/23 1138    Pricilla Loveless, MD 02/03/23 [phone]

## 2023-01-30 NOTE — Discharge Instructions (Signed)
Your influenza test is positive.  Be sure to drink plenty of fluids and take Tylenol for fever, pain, body aches.  We are giving you Tamiflu to help treat the flu.  You were given your first dose today and need to start your next dose in 24 hours.  You were given a dose of Toradol today, which can affect your kidney function.  Thus you should get your kidney function rechecked via blood work by your primary care provider later this week.  Continue to drink increased fluids.  If develop new or worsening chest pain, shortness of breath, vomiting, or any other new/concerning symptoms then return to the ER or call 911.

## 2023-01-30 NOTE — ED Triage Notes (Signed)
Pt BIB [facility] from UC with reports of left sided chest pain and left sided arm pain. Pt was given 324mg  ASA and 1 nitroglycerin with no CP relief.

## 2023-01-30 NOTE — ED Triage Notes (Signed)
Pt here left sided CP that is intermittent starting today; pt sts some congestion starting yesterday; denies cough; pt sts some SOB

## 2023-01-30 NOTE — ED Notes (Signed)
Pt given juice and and crackers.

## 2023-02-04 ENCOUNTER — Ambulatory Visit (INDEPENDENT_AMBULATORY_CARE_PROVIDER_SITE_OTHER): Payer: Medicaid Other

## 2023-02-04 ENCOUNTER — Ambulatory Visit
Admission: EM | Admit: 2023-02-04 | Discharge: 2023-02-04 | Disposition: A | Discharge status: Home / Self Care | Payer: Medicaid Other | Attending: Physician Assistant | Admitting: Physician Assistant

## 2023-02-04 DIAGNOSIS — R0602 Shortness of breath: Secondary | ICD-10-CM | POA: Diagnosis not present

## 2023-02-04 DIAGNOSIS — J09X1 Influenza due to identified novel influenza A virus with pneumonia: Secondary | ICD-10-CM | POA: Diagnosis not present

## 2023-02-04 MED ORDER — DOXYCYCLINE HYCLATE 100 MG PO CAPS: 100.0000 mg | ORAL_CAPSULE | Freq: Two times a day (BID) | ORAL | 0 refills | Status: DC

## 2023-02-04 NOTE — ED Triage Notes (Signed)
Pt presents to UC for c/o sob with talking, coughing x2 days. Taking delsym, tylenol cold and flu, Cepacol

## 2023-02-04 NOTE — Discharge Instructions (Addendum)
Follow up with PCP in 3 - 4 days to ensure symptom improvement Go to ED if your symptoms worsen or fail to improve

## 2023-02-04 NOTE — ED Provider Notes (Signed)
[facility]    CSN: [id number] Arrival date & time: 02/04/23  0827      History   Chief Complaint No chief complaint on file.   HPI Karina [name] is a 38 y.o. female.   Patient here concerned with SOB x 6 days.  Dx with flu 5 days ago, she has completed tamiflu.  She notes cough started 2 days ago.  She has history of HTN, DM, CKD.  Denies f/c, n/v/d, abdominal pain, wheezing.      Past Medical History:  Diagnosis Date   Acute pain of right shoulder 03/13/2019   Allergy    Angio-edema    Asthma    Back pain 12/12/2017   Carpal tunnel syndrome of right wrist 03/13/2019   COVID-19 01/24/2019   Depression 12/27/2012   Diabetes mellitus    Type 2, insulin resistant   Diabetic neuropathy, type II diabetes mellitus (HCC) 05/23/2013   DOE (dyspnea on exertion) 05/21/2019   Onset with covid 19 infection symptoms started 01/16/19  -  05/23/2019   Walked RA  3 laps @ approx 267ft each @ moderat pace  stopped due to end of study,  no limiting sob/ sats 100% at end despite reported sob at rest    Essential hypertension, benign 12/12/2013   GERD (gastroesophageal reflux disease)    History of COVID-19 03/22/2019   Lower extremity edema 08/29/2017   Moderate nonproliferative diabetic retinopathy (HCC) 05/24/2013   Both OS and OD.  Diagnosed by Dr. Altamease Oiler, MD, PhD 05/23/13. Also mild macular edema. Follows with Dr. 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, with long-term current use of insulin (HCC) 12/27/2012        Upper airway cough syndrome 03/22/2019    Patient Active Problem List   Diagnosis Date Noted   Hypotension 05/11/2022   Postural  dizziness with presyncope 05/11/2022   Tooth infection 05/11/2022   CKD (chronic kidney disease), stage IV (HCC) 04/29/2022   Costochondral chest pain 04/29/2022   Flank pain 11/20/2020   Pelvic girdle weakness 11/20/2020   Hypertension 11/20/2020   Stage 3b chronic kidney disease (CKD) (HCC) 11/20/2020   Hypersomnia, persistent 09/11/2020   OSA on CPAP 09/11/2020   Uncontrolled type 2 diabetes mellitus with hyperglycemia (HCC) 09/11/2020   PVFS (postviral fatigue syndrome) 09/11/2020   Other allergic rhinitis 03/18/2020   Reactive airway disease 03/18/2020   Adverse reaction to food, subsequent encounter 03/18/2020   Heartburn 03/18/2020   Vitamin D deficiency 09/04/2019   Stable proliferative diabetic retinopathy of both eyes associated with type 2 diabetes mellitus (HCC) 08/17/2019   Current mild episode of major depressive disorder (HCC) 08/08/2019   Sleep-related headache 07/05/2019   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, drug or chemical induced, with severe nonproliferative retinopathy (HCC) 07/05/2019   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, bilateral (HCC) 07/05/2019   Cephalalgia 07/05/2019   Proliferative diabetic retinopathy of right eye (HCC) 06/20/2019   Proliferative diabetic retinopathy of left eye (HCC) 06/20/2019   Vitreous hemorrhage of left eye (HCC) 06/20/2019   DOE (dyspnea on exertion) 05/21/2019   Tachycardia 05/21/2019   History of COVID-19 03/22/2019  Upper airway cough syndrome 03/22/2019   Shortness of breath 03/22/2019   Carpal tunnel syndrome of right wrist 03/13/2019   Gastroesophageal reflux disease 03/23/2018   Lower extremity edema 08/29/2017   Hypertension associated with stage 2 chronic kidney disease due to type 2 diabetes mellitus (HCC) 12/12/2013   Morbid obesity (HCC) 05/25/2013   Type 2 diabetes mellitus with both eyes affected by moderate nonproliferative retinopathy and macular edema, with  long-term current use of insulin (HCC) 12/27/2012   Depression 12/27/2012    Past Surgical History:  Procedure Laterality Date   CERVICAL BIOPSY  2004   CESAREAN SECTION N/A 06/02/2012   Procedure: CESAREAN SECTION;  Surgeon: Catalina Antigua, MD;  Location: [facility];  Service: Obstetrics;  Laterality: N/A;   RETINAL DETACHMENT REPAIR W/ SCLERAL BUCKLE LE Left 10/08/2019    OB History     Gravida  1   Para  1   Term  1   Preterm      AB  0   Living  1      SAB  0   IAB      Ectopic      Multiple      Live Births  1            Home Medications    Prior to Admission medications   Medication Sig Start Date End Date Taking? Authorizing Provider  acetaminophen (TYLENOL) 500 MG tablet Take 2 tablets (1,000 mg total) by mouth every 6 (six) hours as needed for mild pain (or Fever >/= 101). 05/15/22   Marguerita Merles Latif, DO  albuterol (VENTOLIN HFA) 108 (90 Base) MCG/ACT inhaler Inhale 1-2 puffs into the lungs every 4 (four) hours as needed for shortness of breath.    [provider]  benzonatate (TESSALON) 200 MG capsule Take 1 capsule (200 mg total) by mouth 3 (three) times daily as needed for cough. 01/22/22   Nyoka Cowden, MD  carvedilol (COREG) 12.5 MG tablet Take 1 tablet (12.5 mg total) by mouth 2 (two) times daily with a meal. 05/15/22   Sheikh, Kateri Mc Latif, DO  cetirizine HCl (ZYRTEC) 1 MG/ML solution Take 5mg  every other day. 09/07/22   Wallis Bamberg, PA-C  CHLOROPHYLL PO Take 18 drops by mouth once a week.    [provider]  Continuous Blood Gluc Transmit (DEXCOM G6 TRANSMITTER) MISC 1  ONCE DAILY FOR 90 DAYS 06/18/21   Autry-Lott, Randa Evens, DO  cyclobenzaprine (FLEXERIL) 5 MG tablet 5 mg at bedtime as needed for muscle spasms. 04/21/22   [provider]  doxycycline (VIBRAMYCIN) 100 MG capsule Take 1 capsule (100 mg total) by mouth 2 (two) times daily. 02/04/23  Yes Evern Core, PA-C  fluticasone [facility]) 50 MCG/ACT nasal spray Place 1  spray into both nostrils daily. 04/27/22   Radford Pax, NP  furosemide (LASIX) 20 MG tablet Take 1 tablet (20 mg total) by mouth daily. 05/16/22   Marguerita Merles Latif, DO  furosemide (LASIX) 40 MG tablet Take 1 tablet (40 mg total) by mouth 2 (two) times daily for 3 days. 08/06/22 08/09/22  Theadora Rama Scales, PA-C  HUMALOG KWIKPEN 200 UNIT/ML KwikPen INJECT 20 TO 28 UNITS SUBCUTANEOUSLY THREE TIMES DAILY Patient taking differently: INJECT 16 to 20 UNITS SUBCUTANEOUSLY THREE TIMES DAILY 07/28/20   Valetta Close, MD  injection device for insulin (INPEN 100-PINK-NOVO) DEVI Use as directed with novolog cartridges 04/10/20   Doreene Eland, MD  Insulin Pen Needle (B-D ULTRAFINE III SHORT PEN)  31G X 8 MM MISC 1 Container by Does not apply route as needed. 08/08/19   Melene Plan, MD  ipratropium (ATROVENT) 0.03 % nasal spray Place 2 sprays into both nostrils 2 (two) times daily. 09/07/22   Wallis Bamberg, PA-C  levocetirizine (XYZAL) 5 MG tablet Take 5 mg by mouth every evening. 04/15/22   [provider]  meclizine (ANTIVERT) 25 MG tablet Take 1 tablet (25 mg total) by mouth 3 (three) times daily as needed for dizziness. 05/15/22   Sheikh, Omair Latif, DO  OIL OF OREGANO PO Take 1 drop by mouth once a week.    [provider]  oseltamivir (TAMIFLU) 30 MG capsule Take 1 capsule (30 mg total) by mouth daily for 4 days. 01/31/23 02/04/23  Pricilla Loveless, MD  oxyCODONE (OXY IR/ROXICODONE) 5 MG immediate release tablet Take 1 tablet (5 mg total) by mouth every 6 (six) hours as needed for moderate pain. 05/15/22   Sheikh, Omair Latif, DO  pantoprazole (PROTONIX) 40 MG tablet TAKE 1 TABLET BY MOUTH TWICE DAILY 30-60  MINS  BEFORE  YOUR  FIRST  AND  LAST  MEALS  OF  THE  DAY 12/28/22   Nyoka Cowden, MD  polyethylene glycol (MIRALAX / GLYCOLAX) 17 g packet Take 17 g by mouth 2 (two) times daily. 05/15/22   Marguerita Merles Latif, DO  potassium chloride SA (KLOR-CON M) 20 MEQ tablet Take 1 tablet (20 mEq  total) by mouth in the morning for 3 days. 08/06/22 08/09/22  Theadora Rama Scales, PA-C  promethazine-dextromethorphan (PROMETHAZINE-DM) 6.25-15 MG/5ML syrup Take 5 mLs by mouth 3 (three) times daily as needed for cough. 09/07/22   Wallis Bamberg, PA-C  Semaglutide,0.25 or 0.5MG /DOS, (OZEMPIC, 0.25 OR 0.5 MG/DOSE,) 2 MG/3ML SOPN Inject into the skin.    [provider]  senna-docusate (SENOKOT-S) 8.6-50 MG tablet Take 1 tablet by mouth 2 (two) times daily. 05/15/22   Marguerita Merles Latif, DO  sodium bicarbonate 650 MG tablet Take 1 tablet (650 mg total) by mouth 2 (two) times daily. 05/15/22   Marguerita Merles Latif, DO  gabapentin (NEURONTIN) 100 MG capsule Take 1 capsule (100 mg total) by mouth at bedtime. 08/25/18 01/31/19  Lennox Solders, MD    Family History Family History  Problem Relation Age of Onset   Diabetes Father    Stomach cancer Father    Cirrhosis Father    Cancer Mother    Diabetes Mother    Breast cancer Mother    Rectal cancer Neg Hx    Esophageal cancer Neg Hx    Colon cancer Neg Hx     Social History Social History   Tobacco Use   Smoking status: Never   Smokeless tobacco: Never  Vaping Use   Vaping status: Never Used  Substance Use Topics   Alcohol use: No   Drug use: No     Allergies   Lisinopril, Egg-derived products, and Contrast media [iodinated contrast media]   Review of Systems Review of Systems  Constitutional:  Positive for fatigue. Negative for chills and fever.  HENT:  Negative for congestion, ear pain, nosebleeds, postnasal drip, rhinorrhea, sinus pressure, sinus pain and sore throat.   Eyes:  Negative for pain and redness.  Respiratory:  Positive for cough and shortness of breath. Negative for wheezing.   Gastrointestinal:  Negative for abdominal pain, diarrhea, nausea and vomiting.  Musculoskeletal:  Negative for arthralgias and myalgias.  Skin:  Negative for rash.  Neurological:  Negative for light-headedness  and headaches.   Hematological:  Negative for adenopathy. Does not bruise/bleed easily.  Psychiatric/Behavioral:  Negative for confusion and sleep disturbance.      Physical Exam Triage Vital Signs ED Triage Vitals  Encounter Vitals Group     BP 02/04/23 0848 (!) 136/93     Systolic BP Percentile --      Diastolic BP Percentile --      Pulse Rate 02/04/23 0848 82     Resp 02/04/23 0848 20     Temp 02/04/23 0848 98.3 F (36.8 C)     Temp Source 02/04/23 0848 Oral     SpO2 02/04/23 0848 97 %     Weight --      Height --      Head Circumference --      Peak Flow --      Pain Score 02/04/23 0844 0     Pain Loc --      Pain Education --      Exclude from Growth Chart --    No data found.  Updated Vital Signs BP (!) 136/93 (BP Location: Right Arm)   Pulse 82   Temp 98.3 F (36.8 C) (Oral)   Resp 20   LMP 01/24/2023   SpO2 97%   Visual Acuity Right Eye Distance:   Left Eye Distance:   Bilateral Distance:    Right Eye Near:   Left Eye Near:    Bilateral Near:     Physical Exam Vitals and nursing note reviewed.  Constitutional:      General: She is not in acute distress.    Appearance: Normal appearance. She is not ill-appearing.  HENT:     Head: Normocephalic and atraumatic.  Eyes:     General: No scleral icterus.    Extraocular Movements: Extraocular movements intact.     Conjunctiva/sclera: Conjunctivae normal.  Cardiovascular:     Rate and Rhythm: Normal rate and regular rhythm.     Heart sounds: No murmur heard. Pulmonary:     Effort: Pulmonary effort is normal. No respiratory distress.     Breath sounds: Normal breath sounds. No wheezing or rales.  Musculoskeletal:     Cervical back: Normal range of motion. No rigidity.  Lymphadenopathy:     Cervical: No cervical adenopathy.  Skin:    Capillary Refill: Capillary refill takes less than 2 seconds.     Coloration: Skin is not jaundiced.     Findings: No rash.  Neurological:     General: No focal deficit present.      Mental Status: She is alert and oriented to person, place, and time.     Motor: No weakness.     Gait: Gait normal.  Psychiatric:        Mood and Affect: Mood normal.        Behavior: Behavior normal.      UC Treatments / Results  Labs (all labs ordered are listed, but only abnormal results are displayed) Labs Reviewed - No data to display  EKG   Radiology DG Chest 2 View Result Date: 02/04/2023 CLINICAL DATA:  38 year old female with cough and shortness of breath. Positive influenza. EXAM: CHEST - 2 VIEW COMPARISON:  Chest radiographs 01/30/2023 and earlier. FINDINGS: Lung volumes and mediastinal contours remain normal. Visualized tracheal air column is within normal limits. No pneumothorax, pulmonary edema, pleural effusion or confluent lung opacity. Compared to last year there is subtle streaky peribronchial opacity in the right lower lung, probably the middle lobe. Otherwise  lung markings appear stable. No acute osseous abnormality identified. Paucity of bowel gas in the upper abdomen. IMPRESSION: Subtle evidence of right lower lung bronchopneumonia, probably in the middle lobe. No pleural effusion. Electronically Signed   By: Odessa Fleming M.D.   On: 02/04/2023 09:56    Procedures Procedures (including critical care time)  Medications Ordered in UC Medications - No data to display  Initial Impression / Assessment and Plan / UC Course  I have reviewed the triage vital signs and the nursing notes.  Pertinent labs & imaging results that were available during my care of the patient were reviewed by me and considered in my medical decision making (see chart for details).     Start doxycyline ED precautions provided Go to PCP in 3 - 4 days to ensure sx resolution  Final Clinical Impressions(s) / UC Diagnoses   Final diagnoses:  Shortness of breath  Influenza A with pneumonia     Discharge Instructions      Follow up with PCP in 3 - 4 days to ensure symptom  improvement Go to ED if your symptoms worsen or fail to improve     ED Prescriptions     Medication Sig Dispense Auth. Provider   doxycycline (VIBRAMYCIN) 100 MG capsule Take 1 capsule (100 mg total) by mouth 2 (two) times daily. 14 capsule Evern Core, PA-C      PDMP not reviewed this encounter.   Evern Core, PA-C 02/04/23 1009

## 2023-02-07 ENCOUNTER — Telehealth: Payer: Medicaid Other | Admitting: Emergency Medicine

## 2023-02-07 DIAGNOSIS — J189 Pneumonia, unspecified organism: Secondary | ICD-10-CM

## 2023-02-07 MED ORDER — AMOXICILLIN-POT CLAVULANATE 875-125 MG PO TABS: 1.0000 | ORAL_TABLET | Freq: Two times a day (BID) | ORAL | 0 refills | Status: AC

## 2023-02-07 MED ORDER — AZITHROMYCIN 250 MG PO TABS: ORAL_TABLET | ORAL | 0 refills | Status: AC

## 2023-02-07 MED ORDER — CLOTRIMAZOLE 1 % EX CREA: 1.0000 | TOPICAL_CREAM | Freq: Two times a day (BID) | CUTANEOUS | 0 refills | Status: AC

## 2023-02-07 MED ORDER — VENTOLIN HFA 108 (90 BASE) MCG/ACT IN AERS: 1.0000 | INHALATION_SPRAY | RESPIRATORY_TRACT | 0 refills | Status: AC | PRN

## 2023-02-07 NOTE — Progress Notes (Signed)
Virtual Visit Consent   Karina [name], you are scheduled for a virtual visit with a [name] provider today. Just as with appointments in the office, your consent must be obtained to participate. Your consent will be active for this visit and any virtual visit you may have with one of our providers in the next 365 days. If you have a MyChart account, a copy of this consent can be sent to you electronically.  As this is a virtual visit, video technology does not allow for your provider to perform a traditional examination. This may limit your provider's ability to fully assess your condition. If your provider identifies any concerns that need to be evaluated in person or the need to arrange testing (such as labs, EKG, etc.), we will make arrangements to do so. Although advances in technology are sophisticated, we cannot ensure that it will always work on either your end or our end. If the connection with a video visit is poor, the visit may have to be switched to a telephone visit. With either a video or telephone visit, we are not always able to ensure that we have a secure connection.  By engaging in this virtual visit, you consent to the provision of healthcare and authorize for your insurance to be billed (if applicable) for the services provided during this visit. Depending on your insurance coverage, you may receive a charge related to this service.  I need to obtain your verbal consent now. Are you willing to proceed with your visit today? [name] has provided verbal consent on 02/07/2023 for a virtual visit (video or telephone). [name], NP  Date: 02/07/2023 2:12 PM  Virtual Visit via Video Note   I, [name], connected with  [name]  ([id number], 1985/02/04) on 02/07/23 at  1:45 PM EST by a video-enabled telemedicine application and verified that I am speaking with the correct person using two identifiers.  Location: Patient: Virtual Visit Location Patient:  Home Provider: Virtual Visit Location Provider: Home Office   I discussed the limitations of evaluation and management by telemedicine and the availability of in person appointments. The patient expressed understanding and agreed to proceed.    History of Present Illness: Karina [name] is a 38 y.o. who identifies as a female who was assigned female at birth, and is being seen today for headache and increased bp despite taking anti-HTN meds. She thinks it is from doxycycline that she started on 02/04/23. She was dx with flu A on 1/19 and pna on 1/24 and started on doxycycline. Is still feeling ill but is breathing better. Has been using her albuterol inhaler for some temp relief of SOB but is now out of it, requests refill. BP today and yesterday 160's/90's which is high for her when she takes her meds, which she has been. Blood sugar at home has been ok lately, 80s-90s. Headache is mostly when she coughs.   I asked pt if she wsa having yeast infection side effects from antibiotics - she is not having vaginal yeast symptoms but does have skin sloughing off/shiny skin under her belly  HPI: HPI  Problems:  Patient Active Problem List   Diagnosis Date Noted   Hypotension 05/11/2022   Postural dizziness with presyncope 05/11/2022   Tooth infection 05/11/2022   CKD (chronic kidney disease), stage IV (HCC) 04/29/2022   Costochondral chest pain 04/29/2022   Flank pain 11/20/2020   Pelvic girdle weakness 11/20/2020   Hypertension 11/20/2020  Stage 3b chronic kidney disease (CKD) (HCC) 11/20/2020   Hypersomnia, persistent 09/11/2020   OSA on CPAP 09/11/2020   Uncontrolled type 2 diabetes mellitus with hyperglycemia (HCC) 09/11/2020   PVFS (postviral fatigue syndrome) 09/11/2020   Other allergic rhinitis 03/18/2020   Reactive airway disease 03/18/2020   Adverse reaction to food, subsequent encounter 03/18/2020   Heartburn 03/18/2020   Vitamin D deficiency 09/04/2019   Stable proliferative  diabetic retinopathy of both eyes associated with type 2 diabetes mellitus (HCC) 08/17/2019   Current mild episode of major depressive disorder (HCC) 08/08/2019   Sleep-related headache 07/05/2019   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, drug or chemical induced, with severe nonproliferative retinopathy (HCC) 07/05/2019   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, bilateral (HCC) 07/05/2019   Cephalalgia 07/05/2019   Proliferative diabetic retinopathy of right eye (HCC) 06/20/2019   Proliferative diabetic retinopathy of left eye (HCC) 06/20/2019   Vitreous hemorrhage of left eye (HCC) 06/20/2019   DOE (dyspnea on exertion) 05/21/2019   Tachycardia 05/21/2019   History of COVID-19 03/22/2019   Upper airway cough syndrome 03/22/2019   Shortness of breath 03/22/2019   Carpal tunnel syndrome of right wrist 03/13/2019   Gastroesophageal reflux disease 03/23/2018   Lower extremity edema 08/29/2017   Hypertension associated with stage 2 chronic kidney disease due to type 2 diabetes mellitus (HCC) 12/12/2013   Morbid obesity (HCC) 05/25/2013   Type 2 diabetes mellitus with both eyes affected by moderate nonproliferative retinopathy and macular edema, with long-term current use of insulin (HCC) 12/27/2012   Depression 12/27/2012    Allergies:  Allergies  Allergen Reactions   Lisinopril Cough   Egg-Derived Products Nausea And Vomiting   Contrast Media [Iodinated Contrast Media]    Medications:  Current Outpatient Medications:    albuterol (VENTOLIN HFA) 108 (90 Base) MCG/ACT inhaler, Inhale 1-2 puffs into the lungs every 4 (four) hours as needed for wheezing or shortness of breath., Disp: 54 g, Rfl: 0   amoxicillin-clavulanate (AUGMENTIN) 875-125 MG tablet, Take 1 tablet by mouth 2 (two) times daily., Disp: 20 tablet, Rfl: 0   azithromycin (ZITHROMAX) 250 MG tablet, Take 2 tablets on day 1, then 1 tablet daily on days 2 through 5, Disp: 6 tablet,  Rfl: 0   clotrimazole (LOTRIMIN) 1 % cream, Apply 1 Application topically 2 (two) times daily., Disp: 30 g, Rfl: 0   acetaminophen (TYLENOL) 500 MG tablet, Take 2 tablets (1,000 mg total) by mouth every 6 (six) hours as needed for mild pain (or Fever >/= 101)., Disp: 30 tablet, Rfl: 0   benzonatate (TESSALON) 200 MG capsule, Take 1 capsule (200 mg total) by mouth 3 (three) times daily as needed for cough., Disp: 30 capsule, Rfl: 1   carvedilol (COREG) 12.5 MG tablet, Take 1 tablet (12.5 mg total) by mouth 2 (two) times daily with a meal., Disp: 60 tablet, Rfl: 0   cetirizine HCl (ZYRTEC) 1 MG/ML solution, Take 5mg  every other day., Disp: 300 mL, Rfl: 0   CHLOROPHYLL PO, Take 18 drops by mouth once a week., Disp: , Rfl:    Continuous Blood Gluc Transmit (DEXCOM G6 TRANSMITTER) MISC, 1  ONCE DAILY FOR 90 DAYS, Disp: 1 each, Rfl: 0   cyclobenzaprine (FLEXERIL) 5 MG tablet, 5 mg at bedtime as needed for muscle spasms., Disp: , Rfl:    fluticasone (FLONASE) 50 MCG/ACT nasal spray, Place 1 spray into both nostrils daily., Disp: 15.8 mL, Rfl: 0   furosemide (LASIX) 20  MG tablet, Take 1 tablet (20 mg total) by mouth daily., Disp: 30 tablet, Rfl: 0   furosemide (LASIX) 40 MG tablet, Take 1 tablet (40 mg total) by mouth 2 (two) times daily for 3 days., Disp: 6 tablet, Rfl: 0   HUMALOG KWIKPEN 200 UNIT/ML KwikPen, INJECT 20 TO 28 UNITS SUBCUTANEOUSLY THREE TIMES DAILY (Patient taking differently: INJECT 16 to 20 UNITS SUBCUTANEOUSLY THREE TIMES DAILY), Disp: 12 mL, Rfl: 0   injection device for insulin (INPEN 100-PINK-NOVO) DEVI, Use as directed with novolog cartridges, Disp: 1 each, Rfl: 0   Insulin Pen Needle (B-D ULTRAFINE III SHORT PEN) 31G X 8 MM MISC, 1 Container by Does not apply route as needed., Disp: 1 each, Rfl: 11   ipratropium (ATROVENT) 0.03 % nasal spray, Place 2 sprays into both nostrils 2 (two) times daily., Disp: 30 mL, Rfl: 0   levocetirizine (XYZAL) 5 MG tablet, Take 5 mg by mouth every  evening., Disp: , Rfl:    meclizine (ANTIVERT) 25 MG tablet, Take 1 tablet (25 mg total) by mouth 3 (three) times daily as needed for dizziness., Disp: 30 tablet, Rfl: 0   OIL OF OREGANO PO, Take 1 drop by mouth once a week., Disp: , Rfl:    oxyCODONE (OXY IR/ROXICODONE) 5 MG immediate release tablet, Take 1 tablet (5 mg total) by mouth every 6 (six) hours as needed for moderate pain., Disp: 10 tablet, Rfl: 0   pantoprazole (PROTONIX) 40 MG tablet, TAKE 1 TABLET BY MOUTH TWICE DAILY 30-60  MINS  BEFORE  YOUR  FIRST  AND  LAST  MEALS  OF  THE  DAY, Disp: 60 tablet, Rfl: 1   polyethylene glycol (MIRALAX / GLYCOLAX) 17 g packet, Take 17 g by mouth 2 (two) times daily., Disp: 14 each, Rfl: 0   potassium chloride SA (KLOR-CON M) 20 MEQ tablet, Take 1 tablet (20 mEq total) by mouth in the morning for 3 days., Disp: 3 tablet, Rfl: 0   promethazine-dextromethorphan (PROMETHAZINE-DM) 6.25-15 MG/5ML syrup, Take 5 mLs by mouth 3 (three) times daily as needed for cough., Disp: 200 mL, Rfl: 0   Semaglutide,0.25 or 0.5MG /DOS, (OZEMPIC, 0.25 OR 0.5 MG/DOSE,) 2 MG/3ML SOPN, Inject into the skin., Disp: , Rfl:    senna-docusate (SENOKOT-S) 8.6-50 MG tablet, Take 1 tablet by mouth 2 (two) times daily., Disp: 60 tablet, Rfl: 0   sodium bicarbonate 650 MG tablet, Take 1 tablet (650 mg total) by mouth 2 (two) times daily., Disp: 60 tablet, Rfl: 0  Observations/Objective: Patient is well-developed, well-nourished in no acute distress.  Resting comfortably  at home.  Head is normocephalic, atraumatic.  No labored breathing. Occasional coughing Speech is clear and coherent with logical content.  Patient is alert and oriented at baseline.    Assessment and Plan: 1. Pneumonia due to infectious organism, unspecified laterality, unspecified part of lung (Primary)  Possibly having side effect from medicines, possibly just still sick and  increased BP and headache are part of it. Pt will stop doxy, I rx'd zithromax and  augmentin, refilled albuterol. Rx clotrimaozle forskin of belly.   Follow Up Instructions: I discussed the assessment and treatment plan with the patient. The patient was provided an opportunity to ask questions and all were answered. The patient agreed with the plan and demonstrated an understanding of the instructions.  A copy of instructions were sent to the patient via MyChart unless otherwise noted below.   The patient was advised to call back or seek an in-person evaluation  if the symptoms worsen or if the condition fails to improve as anticipated.    [name], NP

## 2023-02-07 NOTE — Patient Instructions (Signed)
Karina [name], thank you for joining Karina Parsons, NP for today's virtual visit.  While this provider is not your primary care provider (PCP), if your PCP is located in our provider database this encounter information will be shared with them immediately following your visit.   A [facility] MyChart account gives you access to today's visit and all your visits, tests, and labs performed at [facility] " click here if you don't have a [facility] MyChart account or go to [email or web address]  Consent: (Patient) Karina [name] provided verbal consent for this virtual visit at the beginning of the encounter.  Current Medications:  Current Outpatient Medications:    albuterol (VENTOLIN HFA) 108 (90 Base) MCG/ACT inhaler, Inhale 1-2 puffs into the lungs every 4 (four) hours as needed for wheezing or shortness of breath., Disp: 54 g, Rfl: 0   amoxicillin-clavulanate (AUGMENTIN) 875-125 MG tablet, Take 1 tablet by mouth 2 (two) times daily., Disp: 20 tablet, Rfl: 0   azithromycin (ZITHROMAX) 250 MG tablet, Take 2 tablets on day 1, then 1 tablet daily on days 2 through 5, Disp: 6 tablet, Rfl: 0   clotrimazole (LOTRIMIN) 1 % cream, Apply 1 Application topically 2 (two) times daily., Disp: 30 g, Rfl: 0   acetaminophen (TYLENOL) 500 MG tablet, Take 2 tablets (1,000 mg total) by mouth every 6 (six) hours as needed for mild pain (or Fever >/= 101)., Disp: 30 tablet, Rfl: 0   benzonatate (TESSALON) 200 MG capsule, Take 1 capsule (200 mg total) by mouth 3 (three) times daily as needed for cough., Disp: 30 capsule, Rfl: 1   carvedilol (COREG) 12.5 MG tablet, Take 1 tablet (12.5 mg total) by mouth 2 (two) times daily with a meal., Disp: 60 tablet, Rfl: 0   cetirizine HCl (ZYRTEC) 1 MG/ML solution, Take 5mg  every other day., Disp: 300 mL, Rfl: 0   CHLOROPHYLL PO, Take 18 drops by mouth once a week., Disp: , Rfl:    Continuous Blood Gluc Transmit (DEXCOM G6 TRANSMITTER) MISC, 1  ONCE DAILY  FOR 90 DAYS, Disp: 1 each, Rfl: 0   cyclobenzaprine (FLEXERIL) 5 MG tablet, 5 mg at bedtime as needed for muscle spasms., Disp: , Rfl:    fluticasone (FLONASE) 50 MCG/ACT nasal spray, Place 1 spray into both nostrils daily., Disp: 15.8 mL, Rfl: 0   furosemide (LASIX) 20 MG tablet, Take 1 tablet (20 mg total) by mouth daily., Disp: 30 tablet, Rfl: 0   furosemide (LASIX) 40 MG tablet, Take 1 tablet (40 mg total) by mouth 2 (two) times daily for 3 days., Disp: 6 tablet, Rfl: 0   HUMALOG KWIKPEN 200 UNIT/ML KwikPen, INJECT 20 TO 28 UNITS SUBCUTANEOUSLY THREE TIMES DAILY (Patient taking differently: INJECT 16 to 20 UNITS SUBCUTANEOUSLY THREE TIMES DAILY), Disp: 12 mL, Rfl: 0   injection device for insulin (INPEN 100-PINK-NOVO) DEVI, Use as directed with novolog cartridges, Disp: 1 each, Rfl: 0   Insulin Pen Needle (B-D ULTRAFINE III SHORT PEN) 31G X 8 MM MISC, 1 Container by Does not apply route as needed., Disp: 1 each, Rfl: 11   ipratropium (ATROVENT) 0.03 % nasal spray, Place 2 sprays into both nostrils 2 (two) times daily., Disp: 30 mL, Rfl: 0   levocetirizine (XYZAL) 5 MG tablet, Take 5 mg by mouth every evening., Disp: , Rfl:    meclizine (ANTIVERT) 25 MG tablet, Take 1 tablet (25 mg total) by mouth 3 (three) times daily as needed for dizziness., Disp: 30 tablet, Rfl: 0  OIL OF OREGANO PO, Take 1 drop by mouth once a week., Disp: , Rfl:    oxyCODONE (OXY IR/ROXICODONE) 5 MG immediate release tablet, Take 1 tablet (5 mg total) by mouth every 6 (six) hours as needed for moderate pain., Disp: 10 tablet, Rfl: 0   pantoprazole (PROTONIX) 40 MG tablet, TAKE 1 TABLET BY MOUTH TWICE DAILY 30-60  MINS  BEFORE  YOUR  FIRST  AND  LAST  MEALS  OF  THE  DAY, Disp: 60 tablet, Rfl: 1   polyethylene glycol (MIRALAX / GLYCOLAX) 17 g packet, Take 17 g by mouth 2 (two) times daily., Disp: 14 each, Rfl: 0   potassium chloride SA (KLOR-CON M) 20 MEQ tablet, Take 1 tablet (20 mEq total) by mouth in the morning for 3  days., Disp: 3 tablet, Rfl: 0   promethazine-dextromethorphan (PROMETHAZINE-DM) 6.25-15 MG/5ML syrup, Take 5 mLs by mouth 3 (three) times daily as needed for cough., Disp: 200 mL, Rfl: 0   Semaglutide,0.25 or 0.5MG /DOS, (OZEMPIC, 0.25 OR 0.5 MG/DOSE,) 2 MG/3ML SOPN, Inject into the skin., Disp: , Rfl:    senna-docusate (SENOKOT-S) 8.6-50 MG tablet, Take 1 tablet by mouth 2 (two) times daily., Disp: 60 tablet, Rfl: 0   sodium bicarbonate 650 MG tablet, Take 1 tablet (650 mg total) by mouth 2 (two) times daily., Disp: 60 tablet, Rfl: 0   Medications ordered in this encounter:  Meds ordered this encounter  Medications   azithromycin (ZITHROMAX) 250 MG tablet    Sig: Take 2 tablets on day 1, then 1 tablet daily on days 2 through 5    Dispense:  6 tablet    Refill:  0   amoxicillin-clavulanate (AUGMENTIN) 875-125 MG tablet    Sig: Take 1 tablet by mouth 2 (two) times daily.    Dispense:  20 tablet    Refill:  0   clotrimazole (LOTRIMIN) 1 % cream    Sig: Apply 1 Application topically 2 (two) times daily.    Dispense:  30 g    Refill:  0   albuterol (VENTOLIN HFA) 108 (90 Base) MCG/ACT inhaler    Sig: Inhale 1-2 puffs into the lungs every 4 (four) hours as needed for wheezing or shortness of breath.    Dispense:  54 g    Refill:  0     *If you need refills on other medications prior to your next appointment, please contact your pharmacy*  Follow-Up: Call back or seek an in-person evaluation if the symptoms worsen or if the condition fails to improve as anticipated.  [facility] [phone]  Other Instructions Use abuterol 2 puffs every 4-6 hours to help your breathing.   Stop doxycycline - I don't think it is causing your symptoms but I switched you to 2 different antibiotics (azithromycin and amoxicillin/clavulanate).   Start using Mucinex to help thin out the congestion/mucus and make it easier to cough up.   Use the prescription cream clotrimazole on your belly  if the skin is sloughing and shiny like a yeast skin infection.   If this treatment plan does not help you get better, you will need to be rechecked in person   If you have been instructed to have an in-person evaluation today at a local Urgent Care facility, please use the link below. It will take you to a list of all of our available [facility], including address, phone number and hours of operation. Please do not delay care.  [facility]  If you or a family member do not have a primary care provider, use the link below to schedule a visit and establish care. When you choose a [facility] primary care physician or advanced practice provider, you gain a long-term partner in health. Find a Primary Care Provider  Learn more about [facility]'s in-office and virtual care options: [facility] - Get Care Now

## 2023-08-12 ENCOUNTER — Ambulatory Visit: Admitting: Podiatry

## 2023-08-12 DIAGNOSIS — M216X1 Other acquired deformities of right foot: Secondary | ICD-10-CM | POA: Diagnosis not present

## 2023-08-12 DIAGNOSIS — M216X2 Other acquired deformities of left foot: Secondary | ICD-10-CM

## 2023-08-12 DIAGNOSIS — B351 Tinea unguium: Secondary | ICD-10-CM | POA: Diagnosis not present

## 2023-08-12 MED ORDER — CICLOPIROX 8 % EX SOLN: Freq: Every day | CUTANEOUS | 0 refills | Status: AC

## 2023-08-12 MED ORDER — CLOTRIMAZOLE-BETAMETHASONE 1-0.05 % EX CREA: 1.0000 | TOPICAL_CREAM | Freq: Every day | CUTANEOUS | 0 refills | Status: AC

## 2023-08-12 NOTE — Progress Notes (Signed)
 Subjective:  Patient ID: Karina [name], female    DOB: December 20, 1985,  MRN: [id number]  Chief Complaint  Patient presents with   Diabetes    Pt stated that she is here for her yearly diabetic exam     38 y.o. female presents with the above complaint.  Patient presents with complaint bilateral hallux thickening onychodystrophy mycotic nail.  She would like to discuss topical medication.  She also would like to discuss orthotics as well.  She is a diabetic.  Denies any other acute complaints diabetes is controlled  Review of Systems: Negative except as noted in the HPI. Denies N/V/F/Ch.  Past Medical History:  Diagnosis Date   Acute pain of right shoulder 03/13/2019   Allergy    Angio-edema    Asthma    Back pain 12/12/2017   Carpal tunnel syndrome of right wrist 03/13/2019   COVID-19 01/24/2019   Depression 12/27/2012   Diabetes mellitus    Type 2, insulin  resistant   Diabetic neuropathy, type II diabetes mellitus (HCC) 05/23/2013   DOE (dyspnea on exertion) 05/21/2019   Onset with covid 19 infection symptoms started 01/16/19  -  05/23/2019   Walked RA  3 laps @ approx 278ft each @ moderat pace  stopped due to end of study,  no limiting sob/ sats 100% at end despite reported sob at rest    Essential hypertension, benign 12/12/2013   GERD (gastroesophageal reflux disease)    History of COVID-19 03/22/2019   Lower extremity edema 08/29/2017   Moderate nonproliferative diabetic retinopathy (HCC) 05/24/2013   Both OS and OD.  Diagnosed by Dr. Emmy Frisk, MD, PhD 05/23/13. Also mild macular edema. Follows with Dr. 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 Apply over nail and surrounding skin. Apply daily over previous coat. After seven (7) days, may remove with alcohol and continue cycle., Disp: 6.6 mL, Rfl: 0   clotrimazole -betamethasone (LOTRISONE) cream, Apply 1 Application topically daily., Disp: 30 g, Rfl: 0   acetaminophen  (TYLENOL ) 500 MG tablet, Take 2 tablets (1,000 mg total) by mouth every 6 (six) hours as needed for mild pain (or Fever >/= 101)., Disp: 30 tablet, Rfl: 0   albuterol  (VENTOLIN  HFA) 108 (90 Base) MCG/ACT inhaler, Inhale 1-2 puffs into the lungs every 4 (four) hours as needed for wheezing or shortness of breath., Disp: 54 g, Rfl: 0   amoxicillin -clavulanate (AUGMENTIN ) 875-125 MG tablet, Take 1 tablet by mouth 2 (two) times daily., Disp: 20 tablet, Rfl: 0   benzonatate  (TESSALON ) 200 MG capsule, Take 1 capsule (200 mg total) by mouth 3 (three) times daily as needed for cough., Disp: 30 capsule, Rfl: 1   carvedilol  (COREG ) 12.5 MG tablet, Take 1 tablet (12.5 mg total) by mouth 2 (two) times daily with a meal., Disp: 60 tablet, Rfl: 0   cetirizine  HCl (ZYRTEC ) 1 MG/ML solution, Take 5mg  every other day., Disp: 300 mL, Rfl: 0   CHLOROPHYLL PO, Take 18 drops  by mouth once a week., Disp: , Rfl:    clotrimazole  (LOTRIMIN ) 1 % cream, Apply 1 Application topically 2 (two) times daily., Disp: 30 g, Rfl: 0   Continuous Blood Gluc Transmit (DEXCOM G6 TRANSMITTER) MISC, 1  ONCE DAILY FOR 90 DAYS, Disp: 1 each, Rfl: 0   cyclobenzaprine  (FLEXERIL ) 5 MG tablet, 5 mg at bedtime as needed for muscle spasms., Disp: , Rfl:    fluticasone  (FLONASE ) 50 MCG/ACT nasal spray, Place 1 spray into both nostrils daily., Disp: 15.8 mL, Rfl: 0   furosemide  (LASIX ) 20 MG tablet, Take 1 tablet (20 mg total) by mouth daily., Disp: 30 tablet, Rfl: 0   furosemide  (LASIX ) 40  MG tablet, Take 1 tablet (40 mg total) by mouth 2 (two) times daily for 3 days., Disp: 6 tablet, Rfl: 0   HUMALOG  KWIKPEN 200 UNIT/ML KwikPen, INJECT 20 TO 28 UNITS SUBCUTANEOUSLY THREE TIMES DAILY (Patient taking differently: INJECT 16 to 20 UNITS SUBCUTANEOUSLY THREE TIMES DAILY), Disp: 12 mL, Rfl: 0   injection device for insulin  (INPEN 100-PINK-NOVO) DEVI, Use as directed with novolog  cartridges, Disp: 1 each, Rfl: 0   Insulin  Pen Needle (B-D ULTRAFINE III SHORT PEN) 31G X 8 MM MISC, 1 Container by Does not apply route as needed., Disp: 1 each, Rfl: 11   ipratropium (ATROVENT ) 0.03 % nasal spray, Place 2 sprays into both nostrils 2 (two) times daily., Disp: 30 mL, Rfl: 0   levocetirizine (XYZAL ) 5 MG tablet, Take 5 mg by mouth every evening., Disp: , Rfl:    meclizine  (ANTIVERT ) 25 MG tablet, Take 1 tablet (25 mg total) by mouth 3 (three) times daily as needed for dizziness., Disp: 30 tablet, Rfl: 0   OIL OF OREGANO PO, Take 1 drop by mouth once a week., Disp: , Rfl:    oxyCODONE  (OXY IR/ROXICODONE ) 5 MG immediate release tablet, Take 1 tablet (5 mg total) by mouth every 6 (six) hours as needed for moderate pain., Disp: 10 tablet, Rfl: 0   pantoprazole  (PROTONIX ) 40 MG tablet, TAKE 1 TABLET BY MOUTH TWICE DAILY 30-60  MINS  BEFORE  YOUR  FIRST  AND  LAST  MEALS  OF  THE  DAY, Disp: 60 tablet, Rfl: 1   polyethylene glycol (MIRALAX  / GLYCOLAX ) 17 g packet, Take 17 g by mouth 2 (two) times daily., Disp: 14 each, Rfl: 0   potassium chloride  SA (KLOR-CON  M) 20 MEQ tablet, Take 1 tablet (20 mEq total) by mouth in the morning for 3 days., Disp: 3 tablet, Rfl: 0   promethazine -dextromethorphan (PROMETHAZINE -DM) 6.25-15 MG/5ML syrup, Take 5 mLs by mouth 3 (three) times daily as needed for cough., Disp: 200 mL, Rfl: 0   Semaglutide ,0.25 or 0.5MG /DOS, (OZEMPIC , 0.25 OR 0.5 MG/DOSE,) 2 MG/3ML SOPN, Inject into the skin., Disp: , Rfl:    senna-docusate (SENOKOT-S) 8.6-50 MG tablet, Take 1 tablet by mouth 2  (two) times daily., Disp: 60 tablet, Rfl: 0   sodium bicarbonate  650 MG tablet, Take 1 tablet (650 mg total) by mouth 2 (two) times daily., Disp: 60 tablet, Rfl: 0  Social History   Tobacco Use  Smoking Status Never  Smokeless Tobacco Never    Allergies  Allergen Reactions   Lisinopril  Cough   Egg-Derived Products Nausea And Vomiting   Contrast Media [Iodinated Contrast Media]    Objective:  There were no vitals filed for this visit. There is no height or weight on file to calculate BMI. Constitutional Well developed. Well nourished.  Vascular Dorsalis pedis pulses  palpable bilaterally. Posterior tibial pulses palpable bilaterally. Capillary refill normal to all digits.  No cyanosis or clubbing noted. Pedal hair growth normal.  Neurologic Normal speech. Oriented to person, place, and time. Epicritic sensation to light touch grossly present bilaterally.  Dermatologic Nails thickened elongated dystrophic mycotic nail bilateral hallux. Skin within normal limits  Orthopedic: Normal joint ROM without pain or crepitus bilaterally. No visible deformities. No bony tenderness.   Radiographs: None Assessment:   1. Other acquired deformities of left foot   2. Other acquired deformities of right foot   3. Onychomycosis due to dermatophyte    Plan:  Patient was evaluated and treated and all questions answered.  Pes planovalgus/foot deformity -I explained to patient the etiology of pes planovalgus and relationship with Planter fasciitis and various treatment options were discussed.  Given patient foot structure in the setting of Planter fasciitis I believe patient will benefit from custom-made orthotics to help control the hindfoot motion support the arch of the foot and take the stress away from plantar fascial.  Patient agrees with the plan like to proceed with orthotics -Patient was casted for orthotics  Bilateral hallux onychomycosis -Educated the patient on the etiology of  onychomycosis and various treatment options associated with improving the fungal load.  I explained to the patient that there is 3 treatment options available to treat the onychomycosis including topical, p.o., laser treatment.  Patient elected undergo topical medication topical medication was sent to the pharmacy.  Neuropathic pain -Lyrica  was dispensed to help with neuropathic pain.   No follow-ups on file.

## 2023-09-29 ENCOUNTER — Ambulatory Visit

## 2023-09-29 NOTE — Progress Notes (Signed)
 Patient presents today to pick up custom molded foot orthotics, diagnosed with deformity BIL feet  by Dr. Roma.   Orthotics were dispensed and fit was satisfactory. Reviewed instructions for break-in and wear. Written instructions given to patient.  Patient will follow up as needed.   Karina [name]

## 2023-10-13 ENCOUNTER — Encounter (HOSPITAL_COMMUNITY): Payer: Self-pay

## 2023-10-13 ENCOUNTER — Emergency Department (HOSPITAL_COMMUNITY)
Admission: EM | Admit: 2023-10-13 | Discharge: 2023-10-13 | Disposition: A | Discharge status: Home / Self Care | Attending: Emergency Medicine | Admitting: Emergency Medicine

## 2023-10-13 ENCOUNTER — Other Ambulatory Visit: Payer: Self-pay

## 2023-10-13 ENCOUNTER — Ambulatory Visit
Admission: EM | Admit: 2023-10-13 | Discharge: 2023-10-13 | Disposition: A | Discharge status: Home / Self Care | Attending: Family Medicine | Admitting: Family Medicine

## 2023-10-13 ENCOUNTER — Emergency Department (HOSPITAL_COMMUNITY)

## 2023-10-13 DIAGNOSIS — R0789 Other chest pain: Secondary | ICD-10-CM | POA: Diagnosis present

## 2023-10-13 DIAGNOSIS — I160 Hypertensive urgency: Secondary | ICD-10-CM

## 2023-10-13 DIAGNOSIS — E1122 Type 2 diabetes mellitus with diabetic chronic kidney disease: Secondary | ICD-10-CM | POA: Diagnosis not present

## 2023-10-13 DIAGNOSIS — I209 Angina pectoris, unspecified: Secondary | ICD-10-CM | POA: Diagnosis not present

## 2023-10-13 DIAGNOSIS — I200 Unstable angina: Secondary | ICD-10-CM

## 2023-10-13 DIAGNOSIS — N185 Chronic kidney disease, stage 5: Secondary | ICD-10-CM | POA: Diagnosis not present

## 2023-10-13 DIAGNOSIS — Z794 Long term (current) use of insulin: Secondary | ICD-10-CM | POA: Insufficient documentation

## 2023-10-13 DIAGNOSIS — I129 Hypertensive chronic kidney disease with stage 1 through stage 4 chronic kidney disease, or unspecified chronic kidney disease: Secondary | ICD-10-CM | POA: Diagnosis not present

## 2023-10-13 DIAGNOSIS — Z79899 Other long term (current) drug therapy: Secondary | ICD-10-CM | POA: Insufficient documentation

## 2023-10-13 DIAGNOSIS — R072 Precordial pain: Secondary | ICD-10-CM | POA: Insufficient documentation

## 2023-10-13 DIAGNOSIS — N184 Chronic kidney disease, stage 4 (severe): Secondary | ICD-10-CM | POA: Insufficient documentation

## 2023-10-13 LAB — BASIC METABOLIC PANEL WITH GFR
Anion gap: 11 (ref 5–15)
BUN: 34 mg/dL — ABNORMAL HIGH (ref 6–20)
CO2: 21 mmol/L — ABNORMAL LOW (ref 22–32)
Calcium: 8.6 mg/dL — ABNORMAL LOW (ref 8.9–10.3)
Chloride: 103 mmol/L (ref 98–111)
Creatinine, Ser: 5.1 mg/dL — ABNORMAL HIGH (ref 0.44–1.00)
GFR, Estimated: 10 mL/min — ABNORMAL LOW (ref >60)
Glucose, Bld: 106 mg/dL — ABNORMAL HIGH (ref 70–99)
Potassium: 4.3 mmol/L (ref 3.5–5.1)
Sodium: 135 mmol/L (ref 135–145)

## 2023-10-13 LAB — CBC
HCT: 25.3 % — ABNORMAL LOW (ref 36.0–46.0)
Hemoglobin: 8.2 g/dL — ABNORMAL LOW (ref 12.0–15.0)
MCH: 27.6 pg (ref 26.0–34.0)
MCHC: 32.4 g/dL (ref 30.0–36.0)
MCV: 85.2 fL (ref 80.0–100.0)
Platelets: 300 K/uL (ref 150–400)
RBC: 2.97 MIL/uL — ABNORMAL LOW (ref 3.87–5.11)
RDW: 13.1 % (ref 11.5–15.5)
WBC: 5.1 K/uL (ref 4.0–10.5)
nRBC: 0 % (ref 0.0–0.2)

## 2023-10-13 LAB — TROPONIN I (HIGH SENSITIVITY)
Troponin I (High Sensitivity): 21 ng/L — ABNORMAL HIGH (ref <18)
Troponin I (High Sensitivity): 19 ng/L — ABNORMAL HIGH (ref <18)

## 2023-10-13 LAB — HCG, SERUM, QUALITATIVE: Preg, Serum: NEGATIVE

## 2023-10-13 NOTE — Discharge Instructions (Signed)
 Please read and follow all provided instructions.  Your diagnoses today include:  1. Precordial pain     Tests performed today include: An EKG of your heart: No concerning heart rhythms or signs of heart attack A chest x-ray: Was normal Cardiac enzymes - a blood test for heart muscle damage, were both normal Blood counts and electrolytes: Shows chronic anemia, also creatinine of 5.1 today Vital signs. See below for your results today.   Medications prescribed:  None  Take any prescribed medications only as directed.  Follow-up instructions: Please follow-up with your primary care provider as soon as you can for further evaluation of your symptoms.   Return instructions:  SEEK IMMEDIATE MEDICAL ATTENTION IF: You have severe chest pain, especially if the pain is crushing or pressure-like and spreads to the arms, back, neck, or jaw, or if you have sweating, nausea or vomiting, or trouble with breathing. THIS IS AN EMERGENCY. Do not wait to see if the pain will go away. Get medical help at once. Call 911. DO NOT drive yourself to the hospital.  Your chest pain gets worse and does not go away after a few minutes of rest.  You have an attack of chest pain lasting longer than what you usually experience.  You have significant dizziness, if you pass out, or have trouble walking.  You have chest pain not typical of your usual pain for which you originally saw your caregiver.  You have any other emergent concerns regarding your health.  Additional Information: Chest pain comes from many different causes. Your caregiver has diagnosed you as having chest pain that is not specific for one problem, but does not require admission.  You are at low risk for an acute heart condition or other serious illness.   Your vital signs today were: BP (!) 161/81   Pulse 93   Temp 98.5 F (36.9 C)   Resp 18   Ht 5' 2 (1.575 m)   Wt 97.5 kg   LMP 10/02/2023 (Exact Date)   SpO2 100%   BMI 39.32 kg/m   If your blood pressure (BP) was elevated above 135/85 this visit, please have this repeated by your doctor within one month. --------------

## 2023-10-13 NOTE — ED Triage Notes (Signed)
 Pt reports on and off left sided chest pain, chills, tightness sin left arm x 2 days.

## 2023-10-13 NOTE — ED Provider Notes (Signed)
 [facility]  Note:  This document was prepared using Conservation officer, historic buildings and may include unintentional dictation errors.  MRN: [id number] DOB: [birth year]  Subjective:   Karina [name] is a 38 y.o. female presenting for 2-day history of persistent moderate to severe left-sided chest pain with left arm tightness and tingling, intermittent sweating.  Patient has a cardiologist and is undergoing workup now.  She just completed a 2-week Holter monitor study mailed off the monitor yet.  She has previously been admitted to a cardiac unit last year.  No known history of an MI NSTEMI per patient.  She does have significant risk factors.  No shortness of breath, weakness, confusion, severe headache, double vision.  She does have CKD 5, diabetes, asthma.  No respiratory symptoms.  No smoking, drug use, alcohol.  No current facility-administered medications for this encounter.  Current Outpatient Medications:    hydrALAZINE  (APRESOLINE ) 50 MG tablet, Take 50 mg by mouth., Disp: , Rfl:    insulin  degludec (TRESIBA  FLEXTOUCH) 200 UNIT/ML FlexTouch Pen, Inject 48 Units into the skin., Disp: , Rfl:    MOUNJARO 7.5 MG/0.5ML Pen, SMARTSIG:0.5 Milliliter(s) SUB-Q Once a Week, Disp: , Rfl:    rosuvastatin (CRESTOR) 5 MG tablet, Take 5 mg by mouth., Disp: , Rfl:    Vitamin D , Ergocalciferol , (DRISDOL ) 1.25 MG (50000 UNIT) CAPS capsule, Take 50,000 Units by mouth once a week., Disp: , Rfl:    acetaminophen  (TYLENOL ) 500 MG tablet, Take 2 tablets (1,000 mg total) by mouth every 6 (six) hours as needed for mild pain (or Fever >/= 101)., Disp: 30 tablet, Rfl: 0   albuterol  (VENTOLIN  HFA) 108 (90 Base) MCG/ACT inhaler, Inhale 1-2 puffs into the lungs every 4 (four) hours as needed for wheezing or shortness of breath., Disp: 54 g, Rfl: 0   amoxicillin -clavulanate (AUGMENTIN ) 875-125 MG tablet, Take 1 tablet by mouth 2 (two) times daily., Disp: 20 tablet, Rfl: 0   benzonatate   (TESSALON ) 200 MG capsule, Take 1 capsule (200 mg total) by mouth 3 (three) times daily as needed for cough., Disp: 30 capsule, Rfl: 1   carvedilol  (COREG ) 12.5 MG tablet, Take 1 tablet (12.5 mg total) by mouth 2 (two) times daily with a meal., Disp: 60 tablet, Rfl: 0   cetirizine  HCl (ZYRTEC ) 1 MG/ML solution, Take 5mg  every other day., Disp: 300 mL, Rfl: 0   CHLOROPHYLL PO, Take 18 drops by mouth once a week., Disp: , Rfl:    ciclopirox  (PENLAC ) 8 % solution, Apply topically at bedtime. Apply over nail and surrounding skin. Apply daily over previous coat. After seven (7) days, may remove with alcohol and continue cycle., Disp: 6.6 mL, Rfl: 0   clotrimazole  (LOTRIMIN ) 1 % cream, Apply 1 Application topically 2 (two) times daily., Disp: 30 g, Rfl: 0   clotrimazole -betamethasone  (LOTRISONE ) cream, Apply 1 Application topically daily., Disp: 30 g, Rfl: 0   Continuous Blood Gluc Transmit (DEXCOM G6 TRANSMITTER) MISC, 1  ONCE DAILY FOR 90 DAYS, Disp: 1 each, Rfl: 0   Continuous Glucose Sensor (DEXCOM G7 SENSOR) MISC, as directed., Disp: , Rfl:    cyclobenzaprine  (FLEXERIL ) 5 MG tablet, 5 mg at bedtime as needed for muscle spasms., Disp: , Rfl:    fluticasone  (FLONASE ) 50 MCG/ACT nasal spray, Place 1 spray into both nostrils daily., Disp: 15.8 mL, Rfl: 0   furosemide  (LASIX ) 20 MG tablet, Take 1 tablet (20 mg total) by mouth daily., Disp: 30 tablet, Rfl: 0   furosemide  (LASIX )  40 MG tablet, Take 1 tablet (40 mg total) by mouth 2 (two) times daily for 3 days., Disp: 6 tablet, Rfl: 0   HUMALOG  KWIKPEN 200 UNIT/ML KwikPen, INJECT 20 TO 28 UNITS SUBCUTANEOUSLY THREE TIMES DAILY (Patient taking differently: No sig reported), Disp: 12 mL, Rfl: 0   injection device for insulin  (INPEN 100-PINK-NOVO) DEVI, Use as directed with novolog  cartridges, Disp: 1 each, Rfl: 0   Insulin  Pen Needle (B-D ULTRAFINE III SHORT PEN) 31G X 8 MM MISC, 1 Container by Does not apply route as needed., Disp: 1 each, Rfl: 11    ipratropium (ATROVENT ) 0.03 % nasal spray, Place 2 sprays into both nostrils 2 (two) times daily., Disp: 30 mL, Rfl: 0   levocetirizine (XYZAL ) 5 MG tablet, Take 5 mg by mouth every evening., Disp: , Rfl:    meclizine  (ANTIVERT ) 25 MG tablet, Take 1 tablet (25 mg total) by mouth 3 (three) times daily as needed for dizziness., Disp: 30 tablet, Rfl: 0   OIL OF OREGANO PO, Take 1 drop by mouth once a week., Disp: , Rfl:    oxyCODONE  (OXY IR/ROXICODONE ) 5 MG immediate release tablet, Take 1 tablet (5 mg total) by mouth every 6 (six) hours as needed for moderate pain., Disp: 10 tablet, Rfl: 0   pantoprazole  (PROTONIX ) 40 MG tablet, TAKE 1 TABLET BY MOUTH TWICE DAILY 30-60  MINS  BEFORE  YOUR  FIRST  AND  LAST  MEALS  OF  THE  DAY, Disp: 60 tablet, Rfl: 1   polyethylene glycol (MIRALAX  / GLYCOLAX ) 17 g packet, Take 17 g by mouth 2 (two) times daily., Disp: 14 each, Rfl: 0   potassium chloride  SA (KLOR-CON  M) 20 MEQ tablet, Take 1 tablet (20 mEq total) by mouth in the morning for 3 days., Disp: 3 tablet, Rfl: 0   promethazine -dextromethorphan (PROMETHAZINE -DM) 6.25-15 MG/5ML syrup, Take 5 mLs by mouth 3 (three) times daily as needed for cough., Disp: 200 mL, Rfl: 0   Semaglutide ,0.25 or 0.5MG /DOS, (OZEMPIC , 0.25 OR 0.5 MG/DOSE,) 2 MG/3ML SOPN, Inject into the skin., Disp: , Rfl:    senna-docusate (SENOKOT-S) 8.6-50 MG tablet, Take 1 tablet by mouth 2 (two) times daily., Disp: 60 tablet, Rfl: 0   sodium bicarbonate  650 MG tablet, Take 1 tablet (650 mg total) by mouth 2 (two) times daily., Disp: 60 tablet, Rfl: 0   Allergies  Allergen Reactions   Lisinopril  Cough   Egg-Derived Products Nausea And Vomiting   Contrast Media [Iodinated Contrast Media]     Past Medical History:  Diagnosis Date   Acute pain of right shoulder 03/13/2019   Allergy    Angio-edema    Asthma    Back pain 12/12/2017   Carpal tunnel syndrome of right wrist 03/13/2019   COVID-19 01/24/2019   Depression 12/27/2012   Diabetes  mellitus    Type 2, insulin  resistant   Diabetic neuropathy, type II diabetes mellitus (HCC) 05/23/2013   DOE (dyspnea on exertion) 05/21/2019   Onset with covid 19 infection symptoms started 01/16/19  -  05/23/2019   Walked RA  3 laps @ approx 277ft each @ moderat pace  stopped due to end of study,  no limiting sob/ sats 100% at end despite reported sob at rest    Essential hypertension, benign 12/12/2013   GERD (gastroesophageal reflux disease)    History of COVID-19 03/22/2019   Lower extremity edema 08/29/2017   Moderate nonproliferative diabetic retinopathy (HCC) 05/24/2013   Both OS and OD.  Diagnosed by Dr.  Seema Garge, MD, PhD 05/23/13. Also mild macular edema. Follows with Dr. 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, with long-term current use of insulin  (HCC) 12/27/2012        Upper airway cough syndrome 03/22/2019     Past Surgical History:  Procedure Laterality Date   CERVICAL BIOPSY  2004   CESAREAN SECTION N/A 06/02/2012   Procedure: CESAREAN SECTION;  Surgeon: Winton Felt, MD;  Location: [facility];  Service: Obstetrics;  Laterality: N/A;   RETINAL DETACHMENT REPAIR W/ SCLERAL BUCKLE LE Left 10/08/2019    Family History  Problem Relation Age of Onset   Diabetes Father    Stomach cancer Father    Cirrhosis Father    Cancer Mother    Diabetes Mother    Breast cancer Mother    Rectal cancer Neg Hx    Esophageal cancer Neg Hx    Colon cancer Neg Hx     Social History   Tobacco Use   Smoking status: Never   Smokeless tobacco: Never  Vaping Use   Vaping status: Never Used  Substance Use Topics   Alcohol use: No   Drug use: No     ROS   Objective:   Vitals: BP (!) 181/104 (BP Location: Right Arm)   Pulse 95   Temp 98.9 F (37.2 C) (Oral)   Resp 18   LMP 09/28/2023 (Exact Date)   SpO2 97%   BP Readings from Last 3 Encounters:  10/13/23 (!) 181/104  02/04/23 (!) 136/93  01/30/23 (!) 141/75   Physical Exam Constitutional:      General: She is not in acute distress.    Appearance: Normal appearance. She is well-developed. She is not ill-appearing, toxic-appearing or diaphoretic.  HENT:     Head: Normocephalic and atraumatic.     Nose: Nose normal.     Mouth/Throat:     Mouth: Mucous membranes are moist.     Pharynx: No pharyngeal swelling, oropharyngeal exudate, posterior oropharyngeal erythema or uvula swelling.     Tonsils: No tonsillar exudate or tonsillar abscesses. 0 on the right. 0 on the left.  Eyes:     General: No scleral icterus.       Right eye: No discharge.        Left eye: No discharge.     Extraocular Movements: Extraocular movements intact.  Cardiovascular:     Rate and Rhythm: Normal rate and regular rhythm.     Heart sounds: Normal heart sounds. No murmur heard.    No friction rub. No gallop.  Pulmonary:     Effort: Pulmonary effort is normal. No respiratory distress.     Breath sounds: No stridor. No wheezing, rhonchi or rales.  Chest:     Chest wall: No tenderness.  Skin:    General: Skin is warm and dry.  Neurological:     General: No focal deficit present.     Mental Status: She is alert and oriented to person, place, and time.  Psychiatric:        Mood and Affect: Mood normal.        Behavior: Behavior normal.    ED ECG REPORT   Date:  10/13/2023  EKG Time: 4:54 PM  Rate: 93bpm  Rhythm: normal sinus rhythm,  unchanged from previous tracings  Axis: normal  Intervals:none  ST&T Change: T wave flattening in lead aVL, V5 and V6  Narrative Interpretation: Sinus rhythm at 93 bpm, comparable to previous EKG including nonspecific T wave changes.   Assessment and  Plan :   PDMP not reviewed this encounter.  1. Angina pectoris   2. CKD (chronic kidney disease) stage 5, GFR less than 15 ml/min (HCC)   3. Hypertensive urgency    EKG stable, therefore deferred transport by EMS.  Emphasized need for evaluation through the emergency room with the patient.  She is in need of a higher level of care and warrants cardiac workup.  Patient verbalizes understanding and will present to the emergency room now by personal vehicle.   Christopher Savannah, [place] 10/13/23 1659

## 2023-10-13 NOTE — ED Provider Notes (Signed)
 [place] EMERGENCY DEPARTMENT AT [facility] Provider Note   CSN: [id number] Arrival date & time: 10/13/23  1754     Patient presents with: No chief complaint on file.   Karina [name] is a 38 y.o. female.   Patient with history of diabetes, hypertension, chronic kidney disease --presents to the emergency department today for evaluation of chest pain.  Seen at urgent care prior to arrival here, blood pressures were elevated there to 181/104.  Transported by EMS for further evaluation.  Patient endorses intermittent chest pains, left chest, described as squeezing over the past 2 days.  During these episodes she also has tightness in her right arm and right shoulder area.  These come on suddenly, approximately once an hour or so, and last for about 20 to 30 seconds before resolving.  No lightheadedness or syncope.  Not triggered by exertion.  No associated vomiting or diaphoresis.  Patient reports recent Holter monitor which she has yet to send back for evaluation, but is not currently wearing.  She was given this due to tachypalpitations occurring at nighttime.  Patient's feels that her current chest pain is different than these palpitations that she gets.  She reports stage V chronic kidney disease, stable, saw her nephrologist yesterday.       Prior to Admission medications   Medication Sig Start Date End Date Taking? Authorizing Provider  acetaminophen  (TYLENOL ) 500 MG tablet Take 2 tablets (1,000 mg total) by mouth every 6 (six) hours as needed for mild pain (or Fever >/= 101). 05/15/22   Sherrill Cable Latif, DO  albuterol  (VENTOLIN  HFA) 108 [phone] Base) MCG/ACT inhaler Inhale 1-2 puffs into the lungs every 4 (four) hours as needed for wheezing or shortness of breath. 02/07/23   Richad Jon HERO, NP  amoxicillin -clavulanate (AUGMENTIN ) 875-125 MG tablet Take 1 tablet by mouth 2 (two) times daily. 02/07/23   Richad Jon HERO, NP  benzonatate  (TESSALON ) 200 MG capsule Take 1  capsule (200 mg total) by mouth 3 (three) times daily as needed for cough. 01/22/22   Darlean Ozell NOVAK, MD  carvedilol  (COREG ) 12.5 MG tablet Take 1 tablet (12.5 mg total) by mouth 2 (two) times daily with a meal. 05/15/22   Sherrill Cable Latif, DO  cetirizine  HCl (ZYRTEC ) 1 MG/ML solution Take 5mg  every other day. 09/07/22   Christopher Savannah, PA-C  CHLOROPHYLL PO Take 18 drops by mouth once a week.    [provider]  ciclopirox  (PENLAC ) 8 % solution Apply topically at bedtime. Apply over nail and surrounding skin. Apply daily over previous coat. After seven (7) days, may remove with alcohol and continue cycle. 08/12/23   Tobie Franky SQUIBB, DPM  clotrimazole  (LOTRIMIN ) 1 % cream Apply 1 Application topically 2 (two) times daily. 02/07/23   Kabbe, Angela M, NP  clotrimazole -betamethasone  (LOTRISONE ) cream Apply 1 Application topically daily. 08/12/23   Tobie Franky SQUIBB, DPM  Continuous Blood Gluc Transmit (DEXCOM G6 TRANSMITTER) MISC 1  ONCE DAILY FOR 90 DAYS 06/18/21   Autry-Lott, Rojean, DO  Continuous Glucose Sensor (DEXCOM G7 SENSOR) MISC as directed.    [provider]  cyclobenzaprine  (FLEXERIL ) 5 MG tablet 5 mg at bedtime as needed for muscle spasms. 04/21/22   [provider]  fluticasone  (FLONASE ) 50 MCG/ACT nasal spray Place 1 spray into both nostrils daily. 04/27/22   Mayer, Jodi R, NP  furosemide  (LASIX ) 20 MG tablet Take 1 tablet (20 mg total) by mouth daily. 05/16/22   Sherrill Cable Donovan, DO  furosemide  (LASIX ) 40 MG tablet Take 1 tablet (40 mg total) by mouth 2 (two) times daily for 3 days. 08/06/22 08/09/22  Joesph Shaver Scales, PA-C  HUMALOG  KWIKPEN 200 UNIT/ML KwikPen INJECT 20 TO 28 UNITS SUBCUTANEOUSLY THREE TIMES DAILY Patient taking differently: No sig reported 07/28/20   Macario Dorothyann HERO, MD  hydrALAZINE  (APRESOLINE ) 50 MG tablet Take 50 mg by mouth. 02/24/23   [provider]  injection device for insulin  (INPEN 100-PINK-NOVO) DEVI Use as directed with novolog   cartridges 04/10/20   Anders Otto DASEN, MD  insulin  degludec (TRESIBA  FLEXTOUCH) 200 UNIT/ML FlexTouch Pen Inject 48 Units into the skin. 04/20/19   [provider]  Insulin  Pen Needle (B-D ULTRAFINE III SHORT PEN) 31G X 8 MM MISC 1 Container by Does not apply route as needed. 08/08/19   Luke Vernell BRAVO, MD  ipratropium (ATROVENT ) 0.03 % nasal spray Place 2 sprays into both nostrils 2 (two) times daily. 09/07/22   Christopher Savannah, PA-C  levocetirizine (XYZAL ) 5 MG tablet Take 5 mg by mouth every evening. 04/15/22   [provider]  meclizine  (ANTIVERT ) 25 MG tablet Take 1 tablet (25 mg total) by mouth 3 (three) times daily as needed for dizziness. 05/15/22   Sheikh, Omair Latif, DO  MOUNJARO 7.5 MG/0.5ML Pen SMARTSIG:0.5 Milliliter(s) SUB-Q Once a Week 09/13/23   [provider]  OIL OF OREGANO PO Take 1 drop by mouth once a week.    [provider]  oxyCODONE  (OXY IR/ROXICODONE ) 5 MG immediate release tablet Take 1 tablet (5 mg total) by mouth every 6 (six) hours as needed for moderate pain. 05/15/22   Sheikh, Omair Latif, DO  pantoprazole  (PROTONIX ) 40 MG tablet TAKE 1 TABLET BY MOUTH TWICE DAILY 30-60  MINS  BEFORE  YOUR  FIRST  AND  LAST  MEALS  OF  THE  DAY 12/28/22   Darlean Ozell NOVAK, MD  polyethylene glycol (MIRALAX  / GLYCOLAX ) 17 g packet Take 17 g by mouth 2 (two) times daily. 05/15/22   Sheikh, Omair Latif, DO  potassium chloride  SA (KLOR-CON  M) 20 MEQ tablet Take 1 tablet (20 mEq total) by mouth in the morning for 3 days. 08/06/22 08/09/22  Joesph Shaver Scales, PA-C  promethazine -dextromethorphan (PROMETHAZINE -DM) 6.25-15 MG/5ML syrup Take 5 mLs by mouth 3 (three) times daily as needed for cough. 09/07/22   Christopher Savannah, PA-C  rosuvastatin (CRESTOR) 5 MG tablet Take 5 mg by mouth. 09/01/23   [provider]  Semaglutide ,0.25 or 0.5MG /DOS, (OZEMPIC , 0.25 OR 0.5 MG/DOSE,) 2 MG/3ML SOPN Inject into the skin.    [provider]  senna-docusate (SENOKOT-S) 8.6-50  MG tablet Take 1 tablet by mouth 2 (two) times daily. 05/15/22   Sheikh, Omair Latif, DO  sodium bicarbonate  650 MG tablet Take 1 tablet (650 mg total) by mouth 2 (two) times daily. 05/15/22   Sheikh, Alejandro Latif, DO  Vitamin D , Ergocalciferol , (DRISDOL ) 1.25 MG (50000 UNIT) CAPS capsule Take 50,000 Units by mouth once a week. 09/22/22   [provider]  gabapentin  (NEURONTIN ) 100 MG capsule Take 1 capsule (100 mg total) by mouth at bedtime. 08/25/18 01/31/19  Francesco Alan BROCKS, MD    Allergies: Lisinopril , Egg-derived products, and Contrast media [iodinated contrast media]    Review of Systems  Updated Vital Signs BP (!) 161/81   Pulse 93   Temp 98.5 F (36.9 C)   Resp 18   Ht 5' 2 (1.575 m)   Wt 97.5 kg   LMP 09/28/2023 (Exact Date)  SpO2 100%   BMI 39.32 kg/m   Physical Exam Vitals and nursing note reviewed.  Constitutional:      Appearance: She is well-developed. She is not diaphoretic.  HENT:     Head: Normocephalic and atraumatic.     Mouth/Throat:     Mouth: Mucous membranes are not dry.  Eyes:     Conjunctiva/sclera: Conjunctivae normal.  Neck:     Vascular: Normal carotid pulses. No JVD.     Trachea: Trachea normal. No tracheal deviation.  Cardiovascular:     Rate and Rhythm: Normal rate and regular rhythm.     Pulses: No decreased pulses.          Radial pulses are 2+ on the right side and 2+ on the left side.     Heart sounds: Normal heart sounds, S1 normal and S2 normal. No murmur heard. Pulmonary:     Effort: Pulmonary effort is normal. No respiratory distress.     Breath sounds: No wheezing.  Chest:     Chest wall: No tenderness.  Abdominal:     General: Bowel sounds are normal.     Palpations: Abdomen is soft.     Tenderness: There is no abdominal tenderness. There is no guarding or rebound.  Musculoskeletal:        General: Normal range of motion.     Cervical back: Normal range of motion and neck supple. No muscular tenderness.     Right lower  leg: No edema.     Left lower leg: No edema.  Skin:    General: Skin is warm and dry.     Coloration: Skin is not pale.  Neurological:     Mental Status: She is alert.     (all labs ordered are listed, but only abnormal results are displayed) Labs Reviewed  BASIC METABOLIC PANEL WITH GFR - Abnormal; Notable for the following components:      Result Value   CO2 21 (*)    Glucose, Bld 106 (*)    BUN 34 (*)    Creatinine, Ser 5.10 (*)    Calcium 8.6 (*)    GFR, Estimated 10 (*)    All other components within normal limits  CBC - Abnormal; Notable for the following components:   RBC 2.97 (*)    Hemoglobin 8.2 (*)    HCT 25.3 (*)    All other components within normal limits  TROPONIN I (HIGH SENSITIVITY) - Abnormal; Notable for the following components:   Troponin I (High Sensitivity) 21 (*)    All other components within normal limits  HCG, SERUM, QUALITATIVE    ED ECG REPORT   Date: 10/13/2023  Rate: 96  Rhythm: normal sinus rhythm  QRS Axis: normal  Intervals: normal  ST/T Wave abnormalities: normal  Conduction Disutrbances:none  Narrative Interpretation:   Old EKG Reviewed: unchanged from [facility]  I have personally reviewed the EKG tracing and agree with the computerized printout as noted.   Radiology: DG Chest 2 View Result Date: 10/13/2023 EXAM: 2 VIEW(S) XRAY OF THE CHEST 10/13/2023 07:35:00 PM COMPARISON: 02/04/2023 CLINICAL HISTORY: chest pain. Per Triage notes: States she has chest pain in the left chest that is pulsing and it feels like she her arm is being squeezed. She states she's been having chest pain for 2 days and it started getting really bad today. She was sent from UC. FINDINGS: LUNGS AND PLEURA: No focal pulmonary opacity. No pulmonary edema. No pleural effusion. No pneumothorax. HEART AND MEDIASTINUM: No  acute abnormality of the cardiac and mediastinal silhouettes. BONES AND SOFT TISSUES: No acute osseous abnormality. IMPRESSION: 1. No acute  abnormalities. Electronically signed by: Ozell Daring MD 10/13/2023 07:41 PM EDT RP Workstation: [id number]     Procedures   Medications Ordered in the ED - No data to display  ED Course  Patient seen and examined. History obtained directly from patient.   Labs/EKG: Personally reviewed and interpreted labs ordered including: CBC with hemoglobin 8.2 likely anemia of chronic disease, close to baseline; BMP creatinine of 5.1 with a BUN of 34; troponin of 21; pregnancy negative.  EKG without any concerning arrhythmias or ischemic findings.  Imaging: Personally reviewed and interpreted chest x-ray, agree no acute findings.  Medications/Fluids: None ordered, blood pressure elevated but not severely.  Initial impression: Atypical left-sided chest pain, brief episodes.  Awaiting second troponin.  Given patient's multiple comorbidities, I discussed patient with Dr. Pamella who agrees with plan.  Likely discharge if second troponin stable.  9:25 PM Reassessment performed. Patient appears stable, unchanged.  Continues to be asymptomatic.  Labs personally reviewed and interpreted including: Troponin 21 >> 19.  Reviewed pertinent lab work and imaging with patient at bedside. Questions answered.   Most current vital signs reviewed and are as follows: BP (!) 161/81   Pulse 93   Temp 98.5 F (36.9 C)   Resp 18   Ht 5' 2 (1.575 m)   Wt 97.5 kg   LMP 10/02/2023 (Exact Date)   SpO2 100%   BMI 39.32 kg/m   Plan: Discharge to home.   Prescriptions written for: None  Return and follow-up instructions: I encouraged patient to return to ED with severe chest pain, especially if the pain is crushing or pressure-like and spreads to the arms, back, neck, or jaw, or if they have associated sweating, vomiting, or shortness of breath with the pain, or significant pain with activity. We discussed that the evaluation here today indicates a low-risk of serious cause of chest pain, including heart trouble  or a blood clot, but no evaluation is perfect and chest pain can evolve with time. The patient verbalized understanding and agreed.  I encouraged patient to follow-up with their provider in the next 48 hours for recheck.  She verbalizes that she does not want to follow-up with her primary care provider.  She is willing to follow-up with cardiology.  I encouraged her to do so.                                    Medical Decision Making Amount and/or Complexity of Data Reviewed Labs: ordered. Radiology: ordered.   For this patient's complaint of chest pain, the following emergent conditions were considered on the differential diagnosis: acute coronary syndrome, pulmonary embolism, pneumothorax, myocarditis, pericardial tamponade, aortic dissection, thoracic aortic aneurysm complication, esophageal perforation.   Other causes were also considered including: gastroesophageal reflux disease, musculoskeletal pain including costochondritis, pneumonia/pleurisy, herpes zoster, pericarditis.  In regards to possibility of ACS, patient has atypical features of pain, non-ischemic and unchanged EKG and negative troponin(s). Heart score was calculated to be 2.   In regards to possibility of PE, symptoms are atypical for PE and risk profile is low, making PE low likelihood.  Symptoms are not persistent.  No persistent tachycardia, hypoxia, severe shortness of breath.  Patient with CKD, no electrolyte abnormalities tonight.  Creatinine is at baseline.  Chronic anemia noted.  No indication of  worsening kidney function or decompensation.  The patient's vital signs, pertinent lab work and imaging were reviewed and interpreted as discussed in the ED course. Hospitalization was considered for further testing, treatments, or serial exams/observation. However as patient is well-appearing, has a stable exam, and reassuring studies today, I do not feel that they warrant admission at this time. This plan was discussed  with the patient who verbalizes agreement and comfort with this plan and seems reliable and able to return to the Emergency Department with worsening or changing symptoms.       Final diagnoses:  Precordial pain    ED Discharge Orders     None          Desiderio Chew, PA-C 10/13/23 2134    Pamella Ozell LABOR, DO 10/20/23 1726

## 2023-10-13 NOTE — ED Notes (Signed)
 Patient is being discharged from the Urgent Care and sent to the Emergency Department via POV . Per [place] Pancho PA C, patient is in need of higher level of care due to unstable angina. Patient is aware and verbalizes understanding of plan of care.  Vitals:   10/13/23 1636  BP: (!) 181/104  Pulse: 95  Resp: 18  Temp: 98.9 F (37.2 C)  SpO2: 97%

## 2023-10-13 NOTE — ED Triage Notes (Signed)
 States she has chest pain in the left chest that is pulsing and it feels like she her arm is being squeezed. She states she's been having chest pain for 2 days and it started getting really bad today. She was sent from UC.
